# Patient Record
Sex: Female | Born: 1940 | Race: White | Hispanic: No | Marital: Single | Smoking: Never smoker
Health system: Southern US, Community
[De-identification: ages and names within clinical notes are randomized; demographics above are authoritative.]

## PROBLEM LIST (undated history)

## (undated) DIAGNOSIS — I351 Nonrheumatic aortic (valve) insufficiency: Secondary | ICD-10-CM

## (undated) DIAGNOSIS — M8580 Other specified disorders of bone density and structure, unspecified site: Secondary | ICD-10-CM

## (undated) DIAGNOSIS — I219 Acute myocardial infarction, unspecified: Secondary | ICD-10-CM

## (undated) DIAGNOSIS — Z95828 Presence of other vascular implants and grafts: Secondary | ICD-10-CM

## (undated) DIAGNOSIS — I639 Cerebral infarction, unspecified: Secondary | ICD-10-CM

## (undated) DIAGNOSIS — F419 Anxiety disorder, unspecified: Secondary | ICD-10-CM

## (undated) DIAGNOSIS — N390 Urinary tract infection, site not specified: Secondary | ICD-10-CM

## (undated) DIAGNOSIS — I82409 Acute embolism and thrombosis of unspecified deep veins of unspecified lower extremity: Secondary | ICD-10-CM

## (undated) DIAGNOSIS — R600 Localized edema: Secondary | ICD-10-CM

## (undated) DIAGNOSIS — D649 Anemia, unspecified: Secondary | ICD-10-CM

## (undated) DIAGNOSIS — I2510 Atherosclerotic heart disease of native coronary artery without angina pectoris: Secondary | ICD-10-CM

## (undated) DIAGNOSIS — R0600 Dyspnea, unspecified: Secondary | ICD-10-CM

## (undated) DIAGNOSIS — E6609 Other obesity due to excess calories: Secondary | ICD-10-CM

## (undated) DIAGNOSIS — R0609 Other forms of dyspnea: Secondary | ICD-10-CM

## (undated) DIAGNOSIS — I4891 Unspecified atrial fibrillation: Secondary | ICD-10-CM

## (undated) DIAGNOSIS — Z8739 Personal history of other diseases of the musculoskeletal system and connective tissue: Secondary | ICD-10-CM

## (undated) DIAGNOSIS — I10 Essential (primary) hypertension: Secondary | ICD-10-CM

## (undated) DIAGNOSIS — R296 Repeated falls: Secondary | ICD-10-CM

## (undated) DIAGNOSIS — R011 Cardiac murmur, unspecified: Secondary | ICD-10-CM

## (undated) DIAGNOSIS — Z8679 Personal history of other diseases of the circulatory system: Secondary | ICD-10-CM

## (undated) DIAGNOSIS — Z95810 Presence of automatic (implantable) cardiac defibrillator: Secondary | ICD-10-CM

## (undated) DIAGNOSIS — R609 Edema, unspecified: Secondary | ICD-10-CM

## (undated) DIAGNOSIS — N289 Disorder of kidney and ureter, unspecified: Secondary | ICD-10-CM

## (undated) HISTORY — DX: Other forms of dyspnea: R06.09

## (undated) HISTORY — DX: Personal history of other diseases of the musculoskeletal system and connective tissue: Z87.39

## (undated) HISTORY — DX: Other obesity due to excess calories: E66.09

## (undated) HISTORY — DX: Other specified disorders of bone density and structure, unspecified site: M85.80

## (undated) HISTORY — PX: CATARACT EXTRACTION: SUR2

## (undated) HISTORY — PX: STOMACH SURGERY: SHX791

## (undated) HISTORY — DX: Edema, unspecified: R60.9

## (undated) HISTORY — DX: Nonrheumatic aortic (valve) insufficiency: I35.1

## (undated) HISTORY — DX: Presence of automatic (implantable) cardiac defibrillator: Z95.810

## (undated) HISTORY — DX: Dyspnea, unspecified: R06.00

## (undated) HISTORY — DX: Localized edema: R60.0

## (undated) HISTORY — DX: Unspecified atrial fibrillation: I48.91

## (undated) HISTORY — PX: TONSILLECTOMY: SUR1361

## (undated) HISTORY — DX: Personal history of other diseases of the circulatory system: Z86.79

---

## 1998-09-29 DIAGNOSIS — Z95828 Presence of other vascular implants and grafts: Secondary | ICD-10-CM

## 1998-09-29 DIAGNOSIS — I219 Acute myocardial infarction, unspecified: Secondary | ICD-10-CM

## 1998-09-29 DIAGNOSIS — I639 Cerebral infarction, unspecified: Secondary | ICD-10-CM

## 1998-09-29 HISTORY — DX: Presence of other vascular implants and grafts: Z95.828

## 1998-09-29 HISTORY — DX: Acute myocardial infarction, unspecified: I21.9

## 1998-09-29 HISTORY — DX: Cerebral infarction, unspecified: I63.9

## 2000-04-08 ENCOUNTER — Ambulatory Visit (HOSPITAL_COMMUNITY): Admission: RE | Admit: 2000-04-08 | Discharge: 2000-04-08 | Payer: Self-pay | Admitting: Gastroenterology

## 2000-04-08 ENCOUNTER — Encounter (INDEPENDENT_AMBULATORY_CARE_PROVIDER_SITE_OTHER): Payer: Self-pay | Admitting: *Deleted

## 2000-09-28 ENCOUNTER — Encounter: Payer: Self-pay | Admitting: Emergency Medicine

## 2000-09-28 ENCOUNTER — Inpatient Hospital Stay (HOSPITAL_COMMUNITY): Admission: EM | Admit: 2000-09-28 | Discharge: 2000-10-13 | Payer: Self-pay | Admitting: Emergency Medicine

## 2000-09-29 ENCOUNTER — Encounter: Payer: Self-pay | Admitting: Emergency Medicine

## 2000-09-30 ENCOUNTER — Encounter: Payer: Self-pay | Admitting: Emergency Medicine

## 2000-10-01 ENCOUNTER — Encounter: Payer: Self-pay | Admitting: Neurology

## 2000-10-02 ENCOUNTER — Encounter: Payer: Self-pay | Admitting: Neurology

## 2000-10-03 ENCOUNTER — Encounter: Payer: Self-pay | Admitting: Neurology

## 2000-10-05 ENCOUNTER — Encounter: Payer: Self-pay | Admitting: Neurology

## 2000-10-08 ENCOUNTER — Encounter: Payer: Self-pay | Admitting: Neurology

## 2000-10-12 ENCOUNTER — Encounter: Payer: Self-pay | Admitting: Pediatrics

## 2000-10-13 ENCOUNTER — Ambulatory Visit (HOSPITAL_COMMUNITY)
Admission: RE | Admit: 2000-10-13 | Discharge: 2000-10-13 | Payer: Self-pay | Admitting: Physical Medicine & Rehabilitation

## 2000-10-13 ENCOUNTER — Inpatient Hospital Stay
Admission: RE | Admit: 2000-10-13 | Discharge: 2000-10-30 | Payer: Self-pay | Admitting: Physical Medicine & Rehabilitation

## 2000-11-03 ENCOUNTER — Encounter
Admission: RE | Admit: 2000-11-03 | Discharge: 2000-11-20 | Payer: Self-pay | Admitting: Physical Medicine & Rehabilitation

## 2000-11-05 ENCOUNTER — Encounter: Payer: Self-pay | Admitting: Internal Medicine

## 2000-11-05 ENCOUNTER — Inpatient Hospital Stay (HOSPITAL_COMMUNITY): Admission: EM | Admit: 2000-11-05 | Discharge: 2000-11-06 | Payer: Self-pay | Admitting: Emergency Medicine

## 2000-11-05 ENCOUNTER — Encounter: Payer: Self-pay | Admitting: Emergency Medicine

## 2000-12-08 ENCOUNTER — Encounter: Payer: Self-pay | Admitting: Family Medicine

## 2000-12-08 ENCOUNTER — Encounter: Admission: RE | Admit: 2000-12-08 | Discharge: 2000-12-08 | Payer: Self-pay | Admitting: Family Medicine

## 2000-12-14 ENCOUNTER — Encounter: Admission: RE | Admit: 2000-12-14 | Discharge: 2000-12-14 | Payer: Self-pay | Admitting: Family Medicine

## 2000-12-14 ENCOUNTER — Encounter: Payer: Self-pay | Admitting: Family Medicine

## 2001-01-07 ENCOUNTER — Encounter (INDEPENDENT_AMBULATORY_CARE_PROVIDER_SITE_OTHER): Payer: Self-pay

## 2001-01-07 ENCOUNTER — Encounter: Admission: RE | Admit: 2001-01-07 | Discharge: 2001-01-07 | Payer: Self-pay | Admitting: Family Medicine

## 2001-01-07 ENCOUNTER — Encounter: Payer: Self-pay | Admitting: Family Medicine

## 2001-01-07 ENCOUNTER — Other Ambulatory Visit: Admission: RE | Admit: 2001-01-07 | Discharge: 2001-01-07 | Payer: Self-pay | Admitting: Obstetrics and Gynecology

## 2001-01-20 ENCOUNTER — Encounter: Admission: RE | Admit: 2001-01-20 | Discharge: 2001-01-20 | Payer: Self-pay | Admitting: Obstetrics and Gynecology

## 2001-01-20 ENCOUNTER — Encounter: Payer: Self-pay | Admitting: Obstetrics and Gynecology

## 2001-01-26 ENCOUNTER — Encounter: Admission: RE | Admit: 2001-01-26 | Discharge: 2001-01-26 | Payer: Self-pay | Admitting: Family Medicine

## 2001-01-26 ENCOUNTER — Encounter: Payer: Self-pay | Admitting: Family Medicine

## 2001-02-10 ENCOUNTER — Encounter: Payer: Self-pay | Admitting: Obstetrics and Gynecology

## 2001-02-10 ENCOUNTER — Encounter: Admission: RE | Admit: 2001-02-10 | Discharge: 2001-02-10 | Payer: Self-pay | Admitting: Obstetrics and Gynecology

## 2001-04-21 ENCOUNTER — Emergency Department (HOSPITAL_COMMUNITY): Admission: EM | Admit: 2001-04-21 | Discharge: 2001-04-21 | Payer: Self-pay | Admitting: Emergency Medicine

## 2001-04-21 ENCOUNTER — Encounter: Payer: Self-pay | Admitting: Emergency Medicine

## 2001-04-30 ENCOUNTER — Ambulatory Visit (HOSPITAL_COMMUNITY)
Admission: RE | Admit: 2001-04-30 | Discharge: 2001-04-30 | Payer: Self-pay | Admitting: Physical Medicine & Rehabilitation

## 2001-04-30 ENCOUNTER — Encounter: Payer: Self-pay | Admitting: Physical Medicine & Rehabilitation

## 2001-05-17 ENCOUNTER — Inpatient Hospital Stay (HOSPITAL_COMMUNITY): Admission: EM | Admit: 2001-05-17 | Discharge: 2001-05-21 | Payer: Self-pay | Admitting: Emergency Medicine

## 2001-05-17 ENCOUNTER — Encounter: Payer: Self-pay | Admitting: Cardiology

## 2001-05-20 ENCOUNTER — Encounter: Payer: Self-pay | Admitting: Cardiology

## 2001-05-21 ENCOUNTER — Encounter: Payer: Self-pay | Admitting: Cardiology

## 2001-08-25 ENCOUNTER — Inpatient Hospital Stay (HOSPITAL_COMMUNITY): Admission: EM | Admit: 2001-08-25 | Discharge: 2001-08-28 | Payer: Self-pay | Admitting: *Deleted

## 2001-08-25 ENCOUNTER — Encounter: Payer: Self-pay | Admitting: Cardiovascular Disease

## 2001-08-26 ENCOUNTER — Encounter: Payer: Self-pay | Admitting: Internal Medicine

## 2001-08-26 ENCOUNTER — Encounter: Payer: Self-pay | Admitting: Cardiovascular Disease

## 2001-08-27 ENCOUNTER — Encounter: Payer: Self-pay | Admitting: Cardiovascular Disease

## 2001-08-28 ENCOUNTER — Encounter: Payer: Self-pay | Admitting: Internal Medicine

## 2001-09-13 ENCOUNTER — Encounter: Payer: Self-pay | Admitting: Internal Medicine

## 2001-09-13 ENCOUNTER — Ambulatory Visit (HOSPITAL_COMMUNITY): Admission: RE | Admit: 2001-09-13 | Discharge: 2001-09-13 | Payer: Self-pay | Admitting: Internal Medicine

## 2003-03-07 ENCOUNTER — Encounter: Admission: RE | Admit: 2003-03-07 | Discharge: 2003-03-07 | Payer: Self-pay | Admitting: Family Medicine

## 2003-03-07 ENCOUNTER — Encounter: Payer: Self-pay | Admitting: Family Medicine

## 2003-06-18 ENCOUNTER — Emergency Department (HOSPITAL_COMMUNITY): Admission: EM | Admit: 2003-06-18 | Discharge: 2003-06-18 | Payer: Self-pay | Admitting: Emergency Medicine

## 2003-07-06 ENCOUNTER — Other Ambulatory Visit: Admission: RE | Admit: 2003-07-06 | Discharge: 2003-07-06 | Payer: Self-pay | Admitting: Obstetrics and Gynecology

## 2004-10-09 ENCOUNTER — Ambulatory Visit: Payer: Self-pay | Admitting: Neurology

## 2004-11-28 ENCOUNTER — Encounter: Admission: RE | Admit: 2004-11-28 | Discharge: 2004-11-28 | Payer: Self-pay | Admitting: Family Medicine

## 2005-02-20 ENCOUNTER — Other Ambulatory Visit
Admission: RE | Admit: 2005-02-20 | Discharge: 2005-02-20 | Payer: Self-pay | Admitting: Physical Medicine & Rehabilitation

## 2005-03-10 ENCOUNTER — Ambulatory Visit: Payer: Self-pay | Admitting: Internal Medicine

## 2005-06-03 ENCOUNTER — Encounter: Admission: RE | Admit: 2005-06-03 | Discharge: 2005-06-03 | Payer: Self-pay | Admitting: Family Medicine

## 2005-06-09 ENCOUNTER — Ambulatory Visit: Payer: Self-pay | Admitting: Internal Medicine

## 2005-08-22 ENCOUNTER — Inpatient Hospital Stay (HOSPITAL_COMMUNITY): Admission: EM | Admit: 2005-08-22 | Discharge: 2005-08-26 | Payer: Self-pay | Admitting: Emergency Medicine

## 2005-08-25 ENCOUNTER — Encounter: Payer: Self-pay | Admitting: Cardiology

## 2005-09-11 ENCOUNTER — Ambulatory Visit: Payer: Self-pay

## 2006-01-06 ENCOUNTER — Ambulatory Visit: Payer: Self-pay | Admitting: Internal Medicine

## 2006-02-25 ENCOUNTER — Other Ambulatory Visit: Admission: RE | Admit: 2006-02-25 | Discharge: 2006-02-25 | Payer: Self-pay | Admitting: Obstetrics and Gynecology

## 2006-04-02 ENCOUNTER — Ambulatory Visit: Payer: Self-pay

## 2006-08-11 ENCOUNTER — Ambulatory Visit: Payer: Self-pay | Admitting: Internal Medicine

## 2006-12-14 ENCOUNTER — Ambulatory Visit: Payer: Self-pay

## 2007-03-30 ENCOUNTER — Ambulatory Visit: Payer: Self-pay | Admitting: Internal Medicine

## 2007-07-05 ENCOUNTER — Ambulatory Visit: Payer: Self-pay | Admitting: Internal Medicine

## 2007-09-04 ENCOUNTER — Inpatient Hospital Stay (HOSPITAL_COMMUNITY): Admission: EM | Admit: 2007-09-04 | Discharge: 2007-09-09 | Payer: Self-pay | Admitting: Emergency Medicine

## 2007-10-04 ENCOUNTER — Ambulatory Visit: Payer: Self-pay | Admitting: Internal Medicine

## 2008-01-19 ENCOUNTER — Ambulatory Visit: Payer: Self-pay | Admitting: Internal Medicine

## 2008-03-15 ENCOUNTER — Ambulatory Visit: Payer: Self-pay | Admitting: Internal Medicine

## 2008-06-21 ENCOUNTER — Ambulatory Visit: Payer: Self-pay

## 2008-06-22 ENCOUNTER — Ambulatory Visit: Payer: Self-pay | Admitting: Internal Medicine

## 2008-07-12 ENCOUNTER — Ambulatory Visit: Payer: Self-pay | Admitting: Internal Medicine

## 2008-07-12 LAB — CONVERTED CEMR LAB
Basophils Absolute: 0.1 10*3/uL (ref 0.0–0.1)
CO2: 33 meq/L — ABNORMAL HIGH (ref 19–32)
Creatinine, Ser: 1.5 mg/dL — ABNORMAL HIGH (ref 0.4–1.2)
Eosinophils Relative: 7.7 % — ABNORMAL HIGH (ref 0.0–5.0)
GFR calc Af Amer: 45 mL/min
MCHC: 33.8 g/dL (ref 30.0–36.0)
Monocytes Absolute: 0.7 10*3/uL (ref 0.1–1.0)
Monocytes Relative: 12.3 % — ABNORMAL HIGH (ref 3.0–12.0)
Neutro Abs: 2.3 10*3/uL (ref 1.4–7.7)
Neutrophils Relative %: 39.8 % — ABNORMAL LOW (ref 43.0–77.0)
Platelets: 207 10*3/uL (ref 150–400)
Prothrombin Time: 11.5 s (ref 10.9–13.3)
WBC: 5.9 10*3/uL (ref 4.5–10.5)
aPTT: 29.6 s (ref 21.7–29.8)

## 2008-07-18 ENCOUNTER — Ambulatory Visit: Payer: Self-pay | Admitting: Internal Medicine

## 2008-07-18 ENCOUNTER — Ambulatory Visit (HOSPITAL_COMMUNITY): Admission: RE | Admit: 2008-07-18 | Discharge: 2008-07-18 | Payer: Self-pay | Admitting: Internal Medicine

## 2008-08-02 ENCOUNTER — Ambulatory Visit: Payer: Self-pay

## 2008-11-10 ENCOUNTER — Ambulatory Visit: Payer: Self-pay | Admitting: Internal Medicine

## 2008-12-22 ENCOUNTER — Encounter: Payer: Self-pay | Admitting: Internal Medicine

## 2009-01-02 ENCOUNTER — Ambulatory Visit: Payer: Self-pay | Admitting: Internal Medicine

## 2009-01-02 DIAGNOSIS — E785 Hyperlipidemia, unspecified: Secondary | ICD-10-CM | POA: Insufficient documentation

## 2009-01-02 DIAGNOSIS — I509 Heart failure, unspecified: Secondary | ICD-10-CM | POA: Insufficient documentation

## 2009-01-02 DIAGNOSIS — I10 Essential (primary) hypertension: Secondary | ICD-10-CM

## 2009-01-02 LAB — CONVERTED CEMR LAB

## 2009-01-19 DIAGNOSIS — E875 Hyperkalemia: Secondary | ICD-10-CM

## 2009-01-19 DIAGNOSIS — I872 Venous insufficiency (chronic) (peripheral): Secondary | ICD-10-CM | POA: Insufficient documentation

## 2009-01-19 DIAGNOSIS — I6350 Cerebral infarction due to unspecified occlusion or stenosis of unspecified cerebral artery: Secondary | ICD-10-CM | POA: Insufficient documentation

## 2009-01-29 ENCOUNTER — Ambulatory Visit: Payer: Self-pay

## 2009-01-29 ENCOUNTER — Encounter: Payer: Self-pay | Admitting: Internal Medicine

## 2009-02-06 ENCOUNTER — Encounter (INDEPENDENT_AMBULATORY_CARE_PROVIDER_SITE_OTHER): Payer: Self-pay | Admitting: *Deleted

## 2009-02-08 ENCOUNTER — Encounter: Payer: Self-pay | Admitting: Internal Medicine

## 2009-02-13 ENCOUNTER — Encounter: Payer: Self-pay | Admitting: Internal Medicine

## 2009-03-02 ENCOUNTER — Encounter (INDEPENDENT_AMBULATORY_CARE_PROVIDER_SITE_OTHER): Payer: Self-pay | Admitting: *Deleted

## 2009-03-06 ENCOUNTER — Ambulatory Visit: Payer: Self-pay | Admitting: Internal Medicine

## 2009-03-12 ENCOUNTER — Encounter: Payer: Self-pay | Admitting: Internal Medicine

## 2009-03-15 ENCOUNTER — Encounter: Payer: Self-pay | Admitting: Internal Medicine

## 2009-04-30 ENCOUNTER — Ambulatory Visit: Payer: Self-pay

## 2009-04-30 ENCOUNTER — Encounter: Payer: Self-pay | Admitting: Internal Medicine

## 2009-08-08 ENCOUNTER — Ambulatory Visit: Payer: Self-pay | Admitting: Internal Medicine

## 2009-08-08 DIAGNOSIS — R5383 Other fatigue: Secondary | ICD-10-CM

## 2009-08-08 DIAGNOSIS — R5381 Other malaise: Secondary | ICD-10-CM

## 2009-09-05 ENCOUNTER — Telehealth: Payer: Self-pay | Admitting: Internal Medicine

## 2009-09-05 ENCOUNTER — Ambulatory Visit (HOSPITAL_BASED_OUTPATIENT_CLINIC_OR_DEPARTMENT_OTHER): Admission: RE | Admit: 2009-09-05 | Discharge: 2009-09-05 | Payer: Self-pay | Admitting: Internal Medicine

## 2009-09-05 ENCOUNTER — Ambulatory Visit: Payer: Self-pay | Admitting: Diagnostic Radiology

## 2009-09-05 ENCOUNTER — Ambulatory Visit: Payer: Self-pay | Admitting: Internal Medicine

## 2009-09-05 DIAGNOSIS — R609 Edema, unspecified: Secondary | ICD-10-CM

## 2009-09-05 DIAGNOSIS — R413 Other amnesia: Secondary | ICD-10-CM

## 2009-09-05 LAB — CONVERTED CEMR LAB
CO2: 21 meq/L (ref 19–32)
Calcium: 9.4 mg/dL (ref 8.4–10.5)
Chloride: 104 meq/L (ref 96–112)
Pro B Natriuretic peptide (BNP): 400 pg/mL — ABNORMAL HIGH (ref 0.0–100.0)
RBC Folate: 392 ng/mL (ref 180–600)
TSH: 3.679 microintl units/mL (ref 0.350–4.500)
Vitamin B-12: 319 pg/mL (ref 211–911)

## 2009-09-22 ENCOUNTER — Telehealth: Payer: Self-pay | Admitting: Family Medicine

## 2009-09-22 ENCOUNTER — Observation Stay (HOSPITAL_COMMUNITY): Admission: EM | Admit: 2009-09-22 | Discharge: 2009-09-22 | Payer: Self-pay | Admitting: Emergency Medicine

## 2009-09-22 ENCOUNTER — Encounter (INDEPENDENT_AMBULATORY_CARE_PROVIDER_SITE_OTHER): Payer: Self-pay | Admitting: Emergency Medicine

## 2009-09-22 ENCOUNTER — Ambulatory Visit: Payer: Self-pay | Admitting: Vascular Surgery

## 2009-09-25 ENCOUNTER — Telehealth: Payer: Self-pay | Admitting: Internal Medicine

## 2009-10-17 ENCOUNTER — Encounter: Payer: Self-pay | Admitting: Internal Medicine

## 2009-10-18 ENCOUNTER — Ambulatory Visit: Payer: Self-pay | Admitting: Internal Medicine

## 2009-11-07 ENCOUNTER — Ambulatory Visit: Payer: Self-pay

## 2009-11-07 ENCOUNTER — Encounter: Payer: Self-pay | Admitting: Internal Medicine

## 2009-11-19 ENCOUNTER — Telehealth: Payer: Self-pay | Admitting: Internal Medicine

## 2009-12-12 ENCOUNTER — Ambulatory Visit: Payer: Self-pay | Admitting: Internal Medicine

## 2009-12-12 LAB — CONVERTED CEMR LAB
ALT: 14 units/L (ref 0–35)
AST: 19 units/L (ref 0–37)
Bilirubin, Direct: 0.1 mg/dL (ref 0.0–0.3)
Total Protein: 7.4 g/dL (ref 6.0–8.3)

## 2009-12-19 ENCOUNTER — Ambulatory Visit: Payer: Self-pay | Admitting: Internal Medicine

## 2009-12-19 DIAGNOSIS — M545 Low back pain, unspecified: Secondary | ICD-10-CM | POA: Insufficient documentation

## 2010-01-17 ENCOUNTER — Encounter: Payer: Self-pay | Admitting: Internal Medicine

## 2010-01-30 ENCOUNTER — Encounter: Payer: Self-pay | Admitting: Internal Medicine

## 2010-01-30 ENCOUNTER — Ambulatory Visit: Payer: Self-pay

## 2010-04-18 ENCOUNTER — Encounter: Payer: Self-pay | Admitting: Internal Medicine

## 2010-05-21 ENCOUNTER — Ambulatory Visit: Payer: Self-pay | Admitting: Internal Medicine

## 2010-06-17 ENCOUNTER — Encounter: Payer: Self-pay | Admitting: Internal Medicine

## 2010-07-03 ENCOUNTER — Encounter: Payer: Self-pay | Admitting: Internal Medicine

## 2010-07-19 ENCOUNTER — Ambulatory Visit: Payer: Self-pay | Admitting: Cardiology

## 2010-08-14 ENCOUNTER — Ambulatory Visit: Payer: Self-pay

## 2010-09-09 ENCOUNTER — Ambulatory Visit: Payer: Self-pay | Admitting: Internal Medicine

## 2010-09-09 ENCOUNTER — Encounter: Payer: Self-pay | Admitting: Internal Medicine

## 2010-09-09 DIAGNOSIS — M79609 Pain in unspecified limb: Secondary | ICD-10-CM

## 2010-09-09 LAB — CONVERTED CEMR LAB
ALT: 14 units/L (ref 0–35)
Albumin: 4.5 g/dL (ref 3.5–5.2)
CO2: 26 meq/L (ref 19–32)
Calcium: 9.4 mg/dL (ref 8.4–10.5)
Cholesterol: 162 mg/dL (ref 0–200)
LDL Cholesterol: 85 mg/dL (ref 0–99)
Pro B Natriuretic peptide (BNP): 761 pg/mL — ABNORMAL HIGH (ref 0.0–100.0)
Sodium: 142 meq/L (ref 135–145)
Triglycerides: 113 mg/dL (ref <150)
Uric Acid, Serum: 5.1 mg/dL (ref 2.4–7.0)

## 2010-09-20 ENCOUNTER — Encounter: Payer: Self-pay | Admitting: Internal Medicine

## 2010-09-29 HISTORY — PX: EYE SURGERY: SHX253

## 2010-10-29 NOTE — Letter (Signed)
Summary: [facility] Office Note  [facility] Office Note   Imported By: Roderic Ovens 11/14/2009 11:13:24  _____________________________________________________________________  External Attachment:    Type:   Image     Comment:   External Document

## 2010-10-29 NOTE — Cardiovascular Report (Signed)
Summary: Office Visit   Office Visit   Imported By: Roderic Ovens 05/22/2010 15:24:18  _____________________________________________________________________  External Attachment:    Type:   Image     Comment:   External Document

## 2010-10-29 NOTE — Procedures (Signed)
Summary: DEVICE/SAF   Current Medications (verified): 1)  Aspirin 325 Mg Tabs (Aspirin) .... Take 1 Tablet By Mouth Once A Day 2)  Diovan 320 Mg Tabs (Valsartan) .... Take 1 Tablet By Mouth Once A Day 3)  Klor-Con M20 20 Meq Cr-Tabs (Potassium Chloride Crys Cr) .... Take 1 Tablet By Mouth Once A Day 4)  Metoprolol Tartrate 50 Mg Tabs (Metoprolol Tartrate) .... Take 1 Tablet By Mouth Once A Day 5)  Furosemide 40 Mg Tabs (Furosemide) .... Take 1 1/2  Tablet By Mouth Once A Day 6)  Allopurinol 300 Mg Tabs (Allopurinol) .... One By Mouth Once Daily 7)  Crestor 5 Mg Tabs (Rosuvastatin Calcium) .... Take 1 Tablet By Mouth Once A Day 8)  Dulcolax 5 Mg Tbec (Bisacodyl) .... Take 1 Tablet By Mouth Once A Day As Needed 9)  Colchicine 0.6 Mg Tabs (Colchicine) .... One By Mouth Once Daily  Allergies (verified): 1)  ! Neosporin   ICD Specifications Following MD:  Sherryl Manges, MD     ICD Vendor:  Medtronic     ICD Model Number:  [id number]     ICD Serial Number:  [id number] ICD DOI:  07/18/2008     ICD Implanting MD:  Sherryl Manges, MD  Lead 1:    Location: RV     DOI: 07/18/2008     Model #: [id number]     Serial #: [id number]     Status: active  Indications::  CARDIAC ARREST   ICD Follow Up Remote Check?  No Battery Voltage:  3.20 V     Charge Time:  8.8 seconds     Underlying rhythm:  SR ICD Dependent:  No       ICD Device Measurements Right Ventricle:  Amplitude: 12.3 mV, Impedance: 768 ohms, Threshold: 1.0 V at 0.4 msec Shock Impedance: 51/61 ohms   Episodes Coumadin:  No Shock:  0     ATP:  0     Nonsustained:  8     Ventricular Pacing:  4.5%  Brady Parameters Mode VVI     Lower Rate Limit:  50      Tachy Zones VF:  207     VT:  182     Next Cardiology Appt Due:  01/28/2010 Tech Comments:  Normal device function.  Pt with 8 episodes of NSVT that were below detection.  Far field EGM appeared to differ from real-time.  VT zone was set at 162 to allow treatment for these episodes.  Upon  further review, these are afib with RVR.  Will contact patient and either have her come back by office or will go to her house to put VT detection back at 182bpm to prevent inappropriate therapy. Pt not on Coumadin because of prior hemmorhagic stroke. Gypsy Balsam RN BSN  November 07, 2009 11:42 AM  VT zone reset to 182bpm. Gypsy Balsam RN BSN  November 08, 2009 3:04 PM

## 2010-10-29 NOTE — Progress Notes (Signed)
Summary: UPDATE ON CONDITION FROM [facility]   Phone Note Other Incoming   Caller: LISA MATTHEWS [facility]  Summary of Call: SHE ACCESSED HER ON FRIDAY SHE IS DOING WELL ON HER CHOLESTERAL AND GOUT  MEDICINE.  SHE IS ON DUCOLEX.  SHE IS ON A CARDIOCOM SCALE AND IT IS HELPING WITH HER CONGESTIVE HEART FAILURE 1 PLUS ON EDIMA 132/62 PACING AT 68 ON AICD.  IF YOU NEED HER TO ADDRESS ANYTHING ELSE [phone],   Initial call taken by: Roselle Locus,  November 19, 2009 3:07 PM  Follow-up for Phone Call        noted Follow-up by: D. Thomos Lemons DO,  November 19, 2009 3:22 PM

## 2010-10-29 NOTE — Letter (Signed)
Summary: [facility]   Imported By: Lanelle Bal 05/10/2010 12:25:04  _____________________________________________________________________  External Attachment:    Type:   Image     Comment:   External Document

## 2010-10-29 NOTE — Procedures (Signed)
Summary: DEVICE/SAF   Current Medications (verified): 1)  Aspirin 325 Mg Tabs (Aspirin) .... Take 1 Tablet By Mouth Once A Day 2)  Diovan 320 Mg Tabs (Valsartan) .... Take 1 Tablet By Mouth Once A Day 3)  Klor-Con M20 20 Meq Cr-Tabs (Potassium Chloride Crys Cr) .... Take 1 Tablet By Mouth Once A Day 4)  Metoprolol Tartrate 50 Mg Tabs (Metoprolol Tartrate) .... Take 1 Tablet By Mouth Once A Day 5)  Furosemide 40 Mg Tabs (Furosemide) .... Take 1 1/2  Tablet By Mouth Once A Day 6)  Allopurinol 300 Mg Tabs (Allopurinol) .... One By Mouth Once Daily 7)  Crestor 5 Mg Tabs (Rosuvastatin Calcium) .... Take 1 Tablet By Mouth Once A Day 8)  Dulcolax 5 Mg Tbec (Bisacodyl) .... Take 1 Tablet By Mouth Once A Day As Needed 9)  Colchicine 0.6 Mg Tabs (Colchicine) .... One By Mouth Once Daily  Allergies (verified): 1)  ! Neosporin   ICD Specifications Following MD:  Sherryl Manges, MD     ICD Vendor:  Medtronic     ICD Model Number:  D154VWC     ICD Serial Number:  [id number] ICD DOI:  07/18/2008     ICD Implanting MD:  Sherryl Manges, MD  Lead 1:    Location: RV     DOI: 07/18/2008     Model #: [id number]     Serial #: [id number]     Status: active  Indications::  CARDIAC ARREST   ICD Follow Up Battery Voltage:  3.17 V     Charge Time:  9.0 seconds     Underlying rhythm:  SR ICD Dependent:  No       ICD Device Measurements Right Ventricle:  Amplitude: 11.6 mV, Impedance: 832 ohms, Threshold: 2.0 V at 0.4 msec Shock Impedance: 51/66 ohms   Episodes MS Episodes:  0     Percent Mode Switch:  0     Coumadin:  No Nonsustained:  2     Atrial Therapies:  0 Ventricular Pacing:  5.8%  Brady Parameters Mode VVI     Lower Rate Limit:  50      Tachy Zones VF:  207     VT:  182     VT1:  OFF     Next Cardiology Appt Due:  04/29/2010 Tech Comments:  2 NST EPISODES-LONGEST WAS 3 SECONDS.  NORMAL DEVICE FUNCTION.  CHANGED RV AMPLITUDE FROM 2.5 TO 3.0 DUE TO THRESHOLD.  ROV IN CLINIC 3 MTHS. Vella Kohler      Patient Instructions: 1)  Pt to be scheduled for 3 month clinic appointment.

## 2010-10-29 NOTE — Assessment & Plan Note (Signed)
Summary: PACER CHECK   Primary [name]:  Dondra Spry DO  CC:  pacer check. pt not feeling well.  Very sensitive to touch and bp.  Pt not sure of medication.[name]  History of Present Illness:    [name] is seen in followup for aborted cardiac arrest with the implanted ICD. She also has a non ischemic cardiomyopathy with moderate aortic insufficiencyand  with permanent atrial fibrillation.   She complains of periodic SOB; this is fequently but not always exertional and can also be associated wtih cheast heaviness  The last cath I could find was 2002 without obstructive disease   Her most recent echo was reviewed from 2006:  -   Left ventricular size was at the upper limits of normal. Overall         left ventricular systolic function was mildly decreased. Left         ventricular ejection fraction was estimated , range being 40         % to 50 %.. There was no diagnostic evidence of left         ventricular regional wall motion abnormalities. Left         ventricular wall thickness was mildly increased.   -  Aortic valve thickness was mildly to moderately increased. There         was moderate aortic valvular regurgitation.   -  There was mild aortic root dilatation.    she also has periodic edema which correlates in part with her shortness of breath. She adjust s her Lasix on an as-needed basis to accommodate this. Her diet is full salt.   Current Medications (verified): 1)  Aspirin 325 Mg Tabs (Aspirin) .... Take 1 Tablet By Mouth Once A Day 2)  Diovan 320 Mg Tabs (Valsartan) .... Take 1 Tablet By Mouth Once A Day 3)  Klor-Con M20 20 Meq Cr-Tabs (Potassium Chloride Crys Cr) .... Take 1 Tablet By Mouth Once A Day 4)  Metoprolol Tartrate 50 Mg Tabs (Metoprolol Tartrate) .... Take 1 Tablet By Mouth Once A Day 5)  Furosemide 40 Mg Tabs (Furosemide) .... Take 1 1/2  Tablet By Mouth Once A Day 6)  Allopurinol 300 Mg Tabs (Allopurinol) .... One By Mouth Once Daily 7)  Crestor 5 Mg  Tabs (Rosuvastatin Calcium) .... Take 1 Tablet By Mouth Once A Day 8)  Dulcolax 5 Mg Tbec (Bisacodyl) .... Take 1 Tablet By Mouth Once A Day As Needed 9)  Colchicine 0.6 Mg Tabs (Colchicine) .... One By Mouth Once Daily  Allergies (verified): 1)  ! Neosporin  Past History:  Past Medical History: Last updated: 12/19/2009 Aborted cardiac arrest Aortic insuffihciency  ATRIAL FIBRILLATION (ICD-427.31)   ICD - IN SITU (ICD-V45.02)   CONGESTIVE HEART FAILURE (ICD-428.0)  HYPOTENSION, UNSPECIFIED (ICD-458.9)  HYPERTENSION (ICD-401.9) HYPERLIPIDEMIA (ICD-272.4) VENOUS INSUFFICIENCY (ICD-459.81) HYPERKALEMIA (ICD-276.7) CVA (ICD-434.91) GOUT (ICD-274.9) HEALTH MAINTENANCE EXAM (ICD-V70.0) Hx of bradycardia Hx of hemorrhagic stroke 1998 (precluding use of coumadin) Hx of DVT S/P IVC filter Hx of V Fib arrest 2000 (SP ICD)  Past Surgical History: Last updated: 12/19/2009 ICD  8/02 - Rudean Hitt Colonoscopy 2001- Dr. Loreta Ave       Family History: Last updated: 12/19/2009 Father - lung cancer, colon cancer, skin cancer Mother - arthritis, osteoporosis Diabetes - no Breast ca - no       Social History: Last updated: 12/19/2009 Divorced - lives with oldest daughter 2 daughters 77 & 70 2 Sons 47 & 41  Retired - managed a Training and development officer for 23 yrs      Vital Signs:  Patient profile:   70 year old female Height:      62 inches Weight:      200 pounds BMI:     36.71 Pulse rate:   76 / minute Pulse rhythm:   regular BP sitting:   132 / 72  (left arm) Cuff size:   large  Vitals Entered By: Judithe Modest CMA (May 21, 2010 9:38 AM)  Physical Exam  General:  The patient was alert and oriented in no acute distresswith mild SOB HEENT Normal.  Neck veins were flat, carotids were brisk.  Lungs were clear.  Heart sounds were regular without murmurs or gallops.  Abdomen was soft with active bowel sounds. There is no clubbing cyanosis; mild edema Skin Warm and dry         ICD Specifications Following MD:  Sherryl Manges, MD     ICD Vendor:  Medtronic     ICD Model Number:  D154VWC     ICD Serial Number:  [id number] ICD DOI:  07/18/2008     ICD Implanting MD:  Sherryl Manges, MD  Lead 1:    Location: RV     DOI: 07/18/2008     Model #: 1308     Serial #: [id number]     Status: active  Indications::  CARDIAC ARREST   ICD Follow Up Remote Check?  No Battery Voltage:  3.19 V     Charge Time:  9.0 seconds     Underlying rhythm:  SR ICD Dependent:  No       ICD Device Measurements Right Ventricle:  Amplitude: 17 mV, Impedance: 792 ohms, Threshold: 1.0 V at 0.4 msec Shock Impedance: 53/64 ohms   Episodes Coumadin:  No Shock:  0     ATP:  0     Nonsustained:  0     Ventricular Pacing:  11.5%  Brady Parameters Mode VVI     Lower Rate Limit:  50      Tachy Zones VF:  207     VT:  182     VT1:  OFF     Next Cardiology Appt Due:  07/30/2010 Tech Comments:  lead impedance alert values reprogrammed.  Optivol and thoracic impedance normal.  ROV 3 months clinic. Altha Harm, LPN  May 21, 2010 9:46 AM   Impression & Recommendations:  Problem # 1:  ATRIAL FIBRILLATION (ICD-427.31)  permament Her updated medication list for this problem includes:    Aspirin 325 Mg Tabs (Aspirin) .[name]... Take 1 tablet by mouth once a day    Metoprolol Tartrate 50 Mg Tabs (Metoprolol tartrate) .[name]... Take 1 tablet by mouth once a day  Her updated medication list for this problem includes:    Aspirin 325 Mg Tabs (Aspirin) .[name]... Take 1 tablet by mouth once a day    Metoprolol Tartrate 50 Mg Tabs (Metoprolol tartrate) .[name]... Take 1 tablet by mouth once a day  Problem # 2:  ABORTED CARDIAC ARREST (ICD-427.5) no recuurrent VT Her updated medication list for this problem includes:    Aspirin 325 Mg Tabs (Aspirin) .[name]... Take 1 tablet by mouth once a day    Metoprolol Tartrate 50 Mg Tabs (Metoprolol tartrate) .[name]... Take 1 tablet by mouth once a day  Problem # 3:  ICD - IN  SITU-MEDTRONIC (ICD-V45.02) Device parameters and data were reviewed and no changes were made  Patient Instructions: 1)  Your physician  recommends that you schedule a follow-up appointment in: 3 MONTHS WITH KRISTIN AND PAULA 2)  Your physician recommends that you continue on your current medications as directed. Please refer to the Current Medication list given to you today.

## 2010-10-29 NOTE — Cardiovascular Report (Signed)
Summary: Office Visit   Office Visit   Imported By: Roderic Ovens 02/11/2010 16:41:52  _____________________________________________________________________  External Attachment:    Type:   Image     Comment:   External Document

## 2010-10-29 NOTE — Assessment & Plan Note (Signed)
Summary: 1 mo. f/u - jr   Vital Signs:  Patient profile:   70 year old female Weight:      200.25 pounds BMI:     36.76 O2 Sat:      96 % on Room air Temp:     97.4 degrees F oral Pulse rate:   64 / minute Pulse rhythm:   regular Resp:     18 per minute  Vitals Entered By: Glendell Docker CMA (October 18, 2009 8:32 AM)  O2 Flow:  Room air  Primary Care Provider:  D. Thomos Lemons DO  CC:  1 Month Follow up .  History of Present Illness: 1 Month Follow up   70 y/o white female for f/u.  after prev visit - pt seen in ER for severe left leg pain.  she was seen in ER.   she continued to have severe pain.  just sheet rubbing on her foot caused pain. pt was not able to come in for evaluation. she was tx'ed for presumed gout attack with prednisone.  her leg is feeling much better.  her prev uric acid level was 10.2  Preventive Screening-Counseling & Management  Alcohol-Tobacco     Smoking Status: never  Allergies: 1)  ! Neosporin  Past History:  Past Medical History: Aborted cardiac arrest Aortic insuffihciency  ATRIAL FIBRILLATION (ICD-427.31)  ICD - IN SITU (ICD-V45.02)  CONGESTIVE HEART FAILURE (ICD-428.0)  HYPOTENSION, UNSPECIFIED (ICD-458.9)  HYPERTENSION (ICD-401.9) HYPERLIPIDEMIA (ICD-272.4) VENOUS INSUFFICIENCY (ICD-459.81) HYPERKALEMIA (ICD-276.7) CVA (ICD-434.91) GOUT (ICD-274.9) HEALTH MAINTENANCE EXAM (ICD-V70.0) Hx of bradycardia Hx of hemorrhagic stroke 1998 (precluding use of coumadin) Hx of DVT S/P IVC filter Hx of V Fib arrest 2000 (SP ICD)  Past Surgical History: ICD  8/02 - Rudean Hitt Colonoscopy 2001- Dr. Loreta Ave      Family History: Father - lung cancer, colon cancer, skin cancer Mother - arthritis, osteoporosis Diabetes - no Breast ca - no      Social History: Divorced - lives with oldest daughter 2 daughters 75 & 56 2 Sons 45 & 18   Retired - managed a Training and development officer for 23 yrs     Physical Exam  General:  alert,  well-developed, and well-nourished.   Lungs:  normal respiratory effort and normal breath sounds.   Heart:  normal rate, regular rhythm, and no gallop.   Extremities:  1+ left pedal edema and trace right pedal edema.  bilateral varicosities Neurologic:  cranial nerves II-XII intact and gait normal.     Impression & Recommendations:  Problem # 1:  GOUT (ICD-274.9) Pt with recent severe left leg pain.  she was tx'ed with prednisone with good response.  Uric acid > 10.  Increase allopurinol to 300 mg.  pt advised to start taking colchicine first to avoid gout flare.  Her updated medication list for this problem includes:    Allopurinol 300 Mg Tabs (Allopurinol) ..... One by mouth once daily    Colchicine 0.6 Mg Tabs (Colchicine) ..... One by mouth once daily  Complete Medication List: 1)  Aspirin 325 Mg Tabs (Aspirin) .... Take 1 tablet by mouth once a day 2)  Diovan 320 Mg Tabs (Valsartan) .... Take 1 tablet by mouth once a day 3)  Klor-con M20 20 Meq Cr-tabs (Potassium chloride crys cr) .... Take 1 tablet by mouth once a day 4)  Metoprolol Tartrate 50 Mg Tabs (Metoprolol tartrate) .... Take 1 tablet by mouth once a day 5)  Furosemide 40 Mg Tabs (Furosemide) .... Take 1  1/2  tablet by mouth once a day 6)  Allopurinol 300 Mg Tabs (Allopurinol) .... One by mouth once daily 7)  Crestor 5 Mg Tabs (Rosuvastatin calcium) .... Take 1 tablet by mouth once a day 8)  Dulcolax 5 Mg Tbec (Bisacodyl) .... Take 1 tablet by mouth once a day as needed 9)  Colchicine 0.6 Mg Tabs (Colchicine) .... One by mouth once daily  Patient Instructions: 1)  Please schedule a follow-up appointment in 2 months. 2)  Uric Acid level:  274.9 3)  Hepatic Panel prior to visit, ICD-9: 272.4 4)  Please return for lab work one (1) week before your next appointment.  5)  If you experience diarrhea after taking colchicine, take 1/2 tab daily Prescriptions: ALLOPURINOL 300 MG TABS (ALLOPURINOL) one by mouth once daily  #30 x  2   Entered and Authorized by:   D. Thomos Lemons DO   Signed by:   D. Thomos Lemons DO on 10/18/2009   Method used:   Electronically to        [id number]* (retail)       [address]       [place], [place]  [address]       Ph: [phone] or [phone]       Fax: [phone]   RxID:   [id number] COLCHICINE 0.6 MG TABS (COLCHICINE) one by mouth once daily  #30 x 1   Entered and Authorized by:   D. Thomos Lemons DO   Signed by:   D. Thomos Lemons DO on 10/18/2009   Method used:   Electronically to        [id number]* (retail)       [address]       [place], [place]  [address]       Ph: [phone] or [phone]       Fax: [phone]   RxID:   [id number]   Current Allergies (reviewed today): ! NEOSPORIN

## 2010-10-29 NOTE — Letter (Signed)
Summary: [facility]   Imported By: Lanelle Bal 01/29/2010 13:42:51  _____________________________________________________________________  External Attachment:    Type:   Image     Comment:   External Document

## 2010-10-29 NOTE — Miscellaneous (Signed)
Summary: Flu/[facility]  [facility]   Imported By: Lanelle Bal 07/11/2010 12:26:55  _____________________________________________________________________  External Attachment:    Type:   Image     Comment:   External Document

## 2010-10-29 NOTE — Procedures (Signed)
Summary: device check/sl   Current Medications (verified): 1)  Aspirin 325 Mg Tabs (Aspirin) .... Take 1 Tablet By Mouth Once A Day 2)  Klor-Con M20 20 Meq Cr-Tabs (Potassium Chloride Crys Cr) .... Take 1 Tablet By Mouth Once A Day 3)  Metoprolol Tartrate 50 Mg Tabs (Metoprolol Tartrate) .... Take 1 Tablet By Mouth Once A Day 4)  Furosemide 40 Mg Tabs (Furosemide) .... Take 1 1/2  Tablet By Mouth Once A Day 5)  Allopurinol 300 Mg Tabs (Allopurinol) .... One By Mouth Once Daily 6)  Crestor 5 Mg Tabs (Rosuvastatin Calcium) .... Take 1 Tablet By Mouth Once A Day 7)  Dulcolax 5 Mg Tbec (Bisacodyl) .... Take 1 Tablet By Mouth Once A Day As Needed 8)  Colchicine 0.6 Mg Tabs (Colchicine) .... One By Mouth Once Daily  Allergies (verified): 1)  ! Neosporin   ICD Specifications Following MD:  Sherryl Manges, MD     ICD Vendor:  [employer]tronic     ICD Model Number:  [id number]     ICD Serial Number:  [id number] ICD DOI:  07/18/2008     ICD Implanting MD:  Sherryl Manges, MD  Lead 1:    Location: RV     DOI: 07/18/2008     Model #: [id number]     Serial #: [id number]     Status: active  Indications::  CARDIAC ARREST   ICD Follow Up Remote Check?  No Battery Voltage:  3.16 V     Charge Time:  9.2 seconds     Underlying rhythm:  A-fib ICD Dependent:  No       ICD Device Measurements Right Ventricle:  Amplitude: 14.6 mV, Impedance: 776 ohms, Threshold: 1.5 V at 0.4 msec Shock Impedance: 50/57 ohms   Episodes Coumadin:  No Shock:  0     ATP:  0     Nonsustained:  2     Ventricular Pacing:  10.6%  Brady Parameters Mode VVI     Lower Rate Limit:  50      Tachy Zones VF:  207     VT:  182     VT1:  OFF     Next Cardiology Appt Due:  10/30/2010 Tech Comments:  No parameter changes.  Device function normal.  Optivol and thoracic impedance.  Ms. [name] will take an extra fluid pill today as instructed by Dr. Patty Sermons.  I also noticed she was extremely SOB walking just a short distance down the hall today.  I  spoke with [place] @ [facility].  I will send over a copy of her print outs today so she can pass all of this on to Dr. Patty Sermons.   Ms. [name] has a history of chronic A-fib and is not on coumadin because of a history of intracerebral hemorrhage.  We will check her device in 3 months.   Altha Harm, LPN  August 14, 2010 10:33 AM

## 2010-10-29 NOTE — Cardiovascular Report (Signed)
Summary: Office Visit   Office Visit   Imported By: Roderic Ovens 11/16/2009 16:12:35  _____________________________________________________________________  External Attachment:    Type:   Image     Comment:   External Document

## 2010-10-29 NOTE — Assessment & Plan Note (Signed)
Summary: 2 MONTH FOLLOW UP/MHF, resched- jr   Vital Signs:  Patient profile:   70 year old female Height:      62 inches Weight:      202.88 pounds BMI:     37.24 O2 Sat:      95 % Temp:     97.5 degrees F oral Pulse rate:   74 / minute Pulse rhythm:   regular Resp:     20 per minute BP sitting:   138 / 58  (right arm) Cuff size:   large  Vitals Entered By: Glendell Docker CMA (December 19, 2009 10:44 AM) CC: Rm 2- 2 month follow up, Back pain     Last PAP Result Declined   Primary Care Provider:  DThomos Lemons DO  CC:  Rm 2- 2 month follow up and Back pain.  History of Present Illness:  Back Pain      This is a 70 year old woman who presents with Back pain.  The patient denies fever, chills, weakness, and loss of sensation.  The pain is located in the right low back.  The pain began at home and gradually.  The pain radiates to the right leg below the knee.  The pain is made worse by activity.    gout - no flares since higher dose of allopurinol  Preventive Screening-Counseling & Management  Alcohol-Tobacco     Smoking Status: never  Allergies: 1)  ! Neosporin  Past History:  Past Medical History: Aborted cardiac arrest Aortic insuffihciency  ATRIAL FIBRILLATION (ICD-427.31)   ICD - IN SITU (ICD-V45.02)   CONGESTIVE HEART FAILURE (ICD-428.0)  HYPOTENSION, UNSPECIFIED (ICD-458.9)  HYPERTENSION (ICD-401.9) HYPERLIPIDEMIA (ICD-272.4) VENOUS INSUFFICIENCY (ICD-459.81) HYPERKALEMIA (ICD-276.7) CVA (ICD-434.91) GOUT (ICD-274.9) HEALTH MAINTENANCE EXAM (ICD-V70.0) Hx of bradycardia Hx of hemorrhagic stroke 1998 (precluding use of coumadin) Hx of DVT S/P IVC filter Hx of V Fib arrest 2000 (SP ICD)  Past Surgical History: ICD  8/02 - Rudean Hitt Colonoscopy 2001- Dr. Loreta Ave       Family History: Father - lung cancer, colon cancer, skin cancer Mother - arthritis, osteoporosis Diabetes - no Breast ca - no       Social History: Divorced - lives  with oldest daughter 2 daughters 65 & 5 2 Sons 9 & 65   Retired - managed a Training and development officer for 23 yrs      Review of Systems       low back pain and right leg pain  Physical Exam  General:  alert, well-developed, and well-nourished.   Lungs:  normal respiratory effort and normal breath sounds.   Heart:  normal rate, regular rhythm, and no gallop.   Msk:  increased muscle tone, right paraspinal muscles Extremities:  trace left pedal edema and trace right pedal edema.     Impression & Recommendations:  Problem # 1:  BACK PAIN, LUMBAR (ICD-724.2) 70 y/o with lumbar strain.   Use muscle relaxer as directed.   If persistent symptoms, consider imaging.  Her updated medication list for this problem includes:    Aspirin 325 Mg Tabs (Aspirin) .[name]... Take 1 tablet by mouth once a day    Metaxalone 800 Mg Tabs (Metaxalone) .[name]... 1/2 tab by mouth two times a day as needed for back pain  Problem # 2:  GOUT (ICD-274.9) Assessment: Improved No gout flares since higher dose of allopurinol.  uric acid level 5.9 - decreased from 10.2 Her updated medication list for this problem includes:    Allopurinol  300 Mg Tabs (Allopurinol) ..... One by mouth once daily    Colchicine 0.6 Mg Tabs (Colchicine) ..... One by mouth once daily  Complete Medication List: 1)  Aspirin 325 Mg Tabs (Aspirin) .... Take 1 tablet by mouth once a day 2)  Diovan 320 Mg Tabs (Valsartan) .... Take 1 tablet by mouth once a day 3)  Klor-con M20 20 Meq Cr-tabs (Potassium chloride crys cr) .... Take 1 tablet by mouth once a day 4)  Metoprolol Tartrate 50 Mg Tabs (Metoprolol tartrate) .... Take 1 tablet by mouth once a day 5)  Furosemide 40 Mg Tabs (Furosemide) .... Take 1 1/2  tablet by mouth once a day 6)  Allopurinol 300 Mg Tabs (Allopurinol) .... One by mouth once daily 7)  Crestor 5 Mg Tabs (Rosuvastatin calcium) .... Take 1 tablet by mouth once a day 8)  Dulcolax 5 Mg Tbec (Bisacodyl) .... Take 1 tablet by mouth once a day  as needed 9)  Colchicine 0.6 Mg Tabs (Colchicine) .... One by mouth once daily 10)  Metaxalone 800 Mg Tabs (Metaxalone) .... 1/2 tab by mouth two times a day as needed for back pain 11)  Voltaren 1 % Gel (Diclofenac sodium) .... Apply three times a day  Patient Instructions: 1)  Please schedule a follow-up appointment in 2 weeks. 2)  Call our office if your symptoms do not  improve or gets worse. Prescriptions: METAXALONE 800 MG TABS (METAXALONE) 1/2 tab by mouth two times a day as needed for back pain  #20 x 0   Entered and Authorized by:   D. Thomos Lemons DO   Signed by:   D. Thomos Lemons DO on 12/19/2009   Method used:   Electronically to        [id number]* (retail)       [address]       [place], [place]  [address]       Ph: [phone] or [phone]       Fax: [phone]   RxID:   [id number]   Current Allergies (reviewed today): ! NEOSPORIN     Preventive Care Screening  Pap Smear:    Date:  12/19/2009    Results:  Declined

## 2010-10-29 NOTE — Letter (Signed)
Summary: [facility]   Imported By: Lanelle Bal 10/29/2009 12:51:44  _____________________________________________________________________  External Attachment:    Type:   Image     Comment:   External Document

## 2010-10-29 NOTE — Miscellaneous (Signed)
Summary: Flu Vaccine  Clinical Lists Changes  Observations: Added new observation of FLU VAX: Historical (06/17/2010 14:07)      Immunization History:  Influenza Immunization History:    Influenza:  historical (06/17/2010)

## 2010-10-31 NOTE — Assessment & Plan Note (Signed)
Summary: leg pain/mhf   Vital Signs:  Patient profile:   70 year old female Height:      62 inches Weight:      202.75 pounds BMI:     37.22 O2 Sat:      98 % on Room air Temp:     97.6 degrees F oral Pulse rate:   68 / minute BP sitting:   106 / 70  (right arm) Cuff size:   large  Vitals Entered By: Glendell Docker CMA (September 09, 2010 9:16 AM)  O2 Flow:  Room air CC: Bilateral Leg Pain Is Patient Diabetic? No Pain Assessment Patient in pain? yes     Location: foot Intensity: 6 Type: burning Onset of pain  With activity   Primary Care Provider:  Dondra Spry DO  CC:  Bilateral Leg Pain.  History of Present Illness: 70 y/o white female c/o bilateral leg swelling   bottom of feet feels like she is walking on sponges fo some kind  Htn - stable CHF - denies orthopnea or chest pain  Preventive Screening-Counseling & Management  Alcohol-Tobacco     Smoking Status: never  Allergies: 1)  ! Neosporin  Past History:  Past Medical History: Aborted cardiac arrest Aortic insuffihciency  ATRIAL FIBRILLATION (ICD-427.31)   ICD - IN SITU (ICD-V45.02)   CONGESTIVE HEART FAILURE (ICD-428.0)  HYPOTENSION, UNSPECIFIED (ICD-458.9)  HYPERTENSION (ICD-401.9) HYPERLIPIDEMIA (ICD-272.4) VENOUS INSUFFICIENCY (ICD-459.81)  HYPERKALEMIA (ICD-276.7) CVA (ICD-434.91) GOUT (ICD-274.9) HEALTH MAINTENANCE EXAM (ICD-V70.0) Hx of bradycardia Hx of hemorrhagic stroke 1998 (precluding use of coumadin) Hx of DVT S/P IVC filter Hx of V Fib arrest 2000 (SP ICD)  Past Surgical History: ICD  8/02 - Rudean Hitt Colonoscopy 2001- Dr. Loreta Ave        Family History: Father - lung cancer, colon cancer, skin cancer Mother - arthritis, osteoporosis Diabetes - no Breast ca - no        Social History: Divorced - lives with oldest daughter 2 daughters 31 & 67 2 Sons 21 & 34   Retired - managed a Training and development officer for 23 yrs       Physical Exam  General:  alert,  well-developed, and well-nourished.   Lungs:  normal respiratory effort and normal breath sounds.   Heart:  normal rate, regular rhythm, and no gallop.   Extremities:  trace left pedal edema and trace right pedal edema.   Neurologic:  pt able to sense vibration and temp of lower ext / feet   Impression & Recommendations:  Problem # 1:  FOOT PAIN, BILATERAL (ICD-729.5) Assessment New pt having bilateral foot pain.  no sign or symptoms or acute gout flare I suspect component of DJD she c/o sponge like sensation when she walks pes planus refer to podiatry to see if orthodics may help Orders: T-Uric Acid (Blood) ([address]) Podiatry Referral (Podiatry)  Problem # 2:  GOUT (ICD-274.9) Assessment: Unchanged pt advised to use colchicine as needed we discussed risk of developing neuropathy with chronic use  Her updated medication list for this problem includes:    Allopurinol 300 Mg Tabs (Allopurinol) ..... One by mouth once daily    Colchicine 0.6 Mg Tabs (Colchicine) ..... One by mouth once daily  Orders: T-Uric Acid (Blood) ([id number]) T-Hepatic Function (29562-13086)  Problem # 3:  HYPERTENSION (ICD-401.9) Assessment: Unchanged  Her updated medication list for this problem includes:    Metoprolol Tartrate 50 Mg Tabs (Metoprolol tartrate) .[name]... Take 1 tablet by mouth once a day  Furosemide 40 Mg Tabs (Furosemide) .[name]... Take 1 1/2  tablet by mouth once a day    Cozaar 100 Mg Tabs (Losartan potassium) .[name]... Take 1 tablet by mouth once a day  Orders: T-Basic Metabolic Panel ([phone])  BP today: 106/70 Prior BP: 132/72 (05/21/2010)  Labs Reviewed: K+: 4.5 (09/05/2009) Creat: : 1.59 (09/05/2009)     Problem # 4:  CONGESTIVE HEART FAILURE (ICD-428.0) Assessment: Unchanged pt appears euvolemic  Her updated medication list for this problem includes:    Aspirin 325 Mg Tabs (Aspirin) .[name]... Take 1 tablet by mouth once a day    Metoprolol Tartrate 50 Mg Tabs  (Metoprolol tartrate) .[name]... Take 1 tablet by mouth once a day    Furosemide 40 Mg Tabs (Furosemide) .[name]... Take 1 1/2  tablet by mouth once a day    Cozaar 100 Mg Tabs (Losartan potassium) .[name]... Take 1 tablet by mouth once a day  Orders: T-BNP  (B Natriuretic Peptide) (44010-27253)  Problem # 5:  VENOUS INSUFFICIENCY (ICD-459.81)  Orders: Vascular Clinic (Vascular)  Complete Medication List: 1)  Aspirin 325 Mg Tabs (Aspirin) .... Take 1 tablet by mouth once a day 2)  Klor-con M20 20 Meq Cr-tabs (Potassium chloride crys cr) .... Take 1 tablet by mouth once a day 3)  Metoprolol Tartrate 50 Mg Tabs (Metoprolol tartrate) .... Take 1 tablet by mouth once a day 4)  Furosemide 40 Mg Tabs (Furosemide) .... Take 1 1/2  tablet by mouth once a day 5)  Allopurinol 300 Mg Tabs (Allopurinol) .... One by mouth once daily 6)  Crestor 5 Mg Tabs (Rosuvastatin calcium) .... Take 1 tablet by mouth once a day 7)  Dulcolax 5 Mg Tbec (Bisacodyl) .... Take 1 tablet by mouth once a day as needed 8)  Colchicine 0.6 Mg Tabs (Colchicine) .... One by mouth once daily 9)  Cozaar 100 Mg Tabs (Losartan potassium) .... Take 1 tablet by mouth once a day  Other Orders: T-Lipid Profile ([id number])  Patient Instructions: 1)  Please schedule a follow-up appointment in 3 months. 2)  Our office will contact you re:  podiatry referral and vascular clinic referral   Orders Added: 1)  T-Uric Acid (Blood) [[id number]] 2)  T-BNP  (B Natriuretic Peptide) [83880-55185] 3)  T-Basic Metabolic Panel [80048-22910] 4)  T-Hepatic Function [80076-22960] 5)  T-Lipid Profile [80061-22930] 6)  Vascular Clinic [Vascular] 7)  Podiatry Referral [Podiatry] 8)  Est. Patient Level III [59563]    Current Allergies (reviewed today): ! NEOSPORIN

## 2010-10-31 NOTE — Letter (Signed)
   [place] [facility] [address] [place], [place]  [address]  [place] Phone: [phone]      September 20, 2010   [name] [address] [place], [place] [address]  RE:  LAB RESULTS  Dear  Ms. Witthuhn,  The following is an interpretation of your most recent lab tests.  Please take note of any instructions provided or changes to medications that have resulted from your lab work.  ELECTROLYTES:  Good - no changes needed  KIDNEY FUNCTION TESTS:  Stable - no changes needed  LIPID PANEL:  Good - no changes needed Triglyceride: 113   Cholesterol: 162   LDL: 85   HDL: 54   Chol/HDL%:  3.0 Ratio   Uric acid level - normal       Sincerely Yours,    Dr. Thomos Lemons  Appended Document:  Mailed.

## 2010-11-13 ENCOUNTER — Encounter (INDEPENDENT_AMBULATORY_CARE_PROVIDER_SITE_OTHER): Payer: Medicare Other | Admitting: Vascular Surgery

## 2010-11-13 ENCOUNTER — Encounter (INDEPENDENT_AMBULATORY_CARE_PROVIDER_SITE_OTHER): Payer: Medicare Other

## 2010-11-13 DIAGNOSIS — I83893 Varicose veins of bilateral lower extremities with other complications: Secondary | ICD-10-CM

## 2010-11-13 DIAGNOSIS — I872 Venous insufficiency (chronic) (peripheral): Secondary | ICD-10-CM

## 2010-11-15 NOTE — Consult Note (Signed)
NEW PATIENT CONSULTATION  [name], SWIGER R DOB:  11/16/1940                                       11/13/2010 ZOXWR#:[id number]  The patient presents today for evaluation of longstanding venous hypertension.  She has longstanding progressive venous varicosities and has had markedly increased pain, particularly around her lower ankles and medial malleolus area.  She has been very actively wearing compression garments for many years.  This did give her some improvement in the past, but she is now having worsening symptoms.  She does have a history of venous ulcer treated at [facility] many years ago, and this finally healed after 2 years when she quit her job and had more elevation.  Her history is negative for DVT.  She does have a history of hypertension, prior myocardial infarction, history of congestive heart failure.  Does have history of pacemaker and stroke.  SOCIAL HISTORY:  She is single.  She is retired.  She does not drink alcohol or smoke.  FAMILY HISTORY:  Significant for hypertension in her mother and venous varicosities and hypertension in her father.  REVIEW OF SYSTEMS:  GENERAL:  No weight loss or gain. VASCULAR:  Positive for history of phlebitis, nonhealing ulcer, stroke. CARDIAC:  Shortness of breath with exertion. PULMONARY:  Wheezing. Review of systems otherwise negative.  PHYSICAL EXAMINATION:  Well-developed, well-nourished, moderately obese white female in no acute stress.  Blood pressure 192/98, pulse 105, respirations 24.  HEENT:  Normal.  Her radial and dorsalis pedis pulses are 2+ bilaterally.  Musculoskeletal:  Shows no major deformities or cyanosis.  Neurologic:  No focal weakness, paresthesias.  Skin:  Without any open ulcers or rashes.  She does have induration of both ankles and marked varicosities, most particularly from her medial calves bilaterally.  She underwent noninvasive vascular laboratory studies in our office,  and this shows mild reflux in her deep system.  She does have extensive reflux throughout her great saphenous veins bilaterally from saphenofemoral junction distally.  I discussed this at length with the patient.  She has worn knee-high compression religiously.  We have written her a prescription today for thigh-high compression.  I plan to see her again in 3 months for continued discussion.  I do feel she would be an excellent candidate for staged laser ablation of her great saphenous veins bilaterally should she fail conservative treatment.    Larina Earthly, M.D.  TFE/MEDQ  D:  11/13/2010  T:  11/14/2010  Job:  [id number]  cc:   Barbette Hair. Artist Pais, DO

## 2010-11-25 ENCOUNTER — Encounter (INDEPENDENT_AMBULATORY_CARE_PROVIDER_SITE_OTHER): Payer: Medicare Other | Admitting: Internal Medicine

## 2010-11-25 ENCOUNTER — Encounter: Payer: Self-pay | Admitting: Internal Medicine

## 2010-11-25 ENCOUNTER — Other Ambulatory Visit: Payer: Self-pay | Admitting: Internal Medicine

## 2010-11-25 DIAGNOSIS — I5042 Chronic combined systolic (congestive) and diastolic (congestive) heart failure: Secondary | ICD-10-CM

## 2010-11-25 DIAGNOSIS — R0602 Shortness of breath: Secondary | ICD-10-CM

## 2010-11-25 DIAGNOSIS — I509 Heart failure, unspecified: Secondary | ICD-10-CM

## 2010-11-25 DIAGNOSIS — I5043 Acute on chronic combined systolic (congestive) and diastolic (congestive) heart failure: Secondary | ICD-10-CM | POA: Insufficient documentation

## 2010-11-25 DIAGNOSIS — E875 Hyperkalemia: Secondary | ICD-10-CM

## 2010-11-25 DIAGNOSIS — Z95810 Presence of automatic (implantable) cardiac defibrillator: Secondary | ICD-10-CM

## 2010-11-25 DIAGNOSIS — I469 Cardiac arrest, cause unspecified: Secondary | ICD-10-CM

## 2010-11-25 LAB — BASIC METABOLIC PANEL
CO2: 23 mEq/L (ref 19–32)
Chloride: 108 mEq/L (ref 96–112)
GFR: 43.19 mL/min — ABNORMAL LOW (ref >60.00)
Glucose, Bld: 109 mg/dL — ABNORMAL HIGH (ref 70–99)
Potassium: 4.2 mEq/L (ref 3.5–5.1)

## 2010-11-25 LAB — BRAIN NATRIURETIC PEPTIDE: Pro B Natriuretic peptide (BNP): 1343.2 pg/mL — ABNORMAL HIGH (ref 0.0–100.0)

## 2010-11-26 NOTE — Procedures (Unsigned)
LOWER EXTREMITY VENOUS REFLUX EXAM  INDICATION:  Painful varicose veins.  EXAM:  Using color-flow imaging and pulse Doppler spectral analysis, the bilateral common femoral, superficial femoral, popliteal, posterior tibial, greater and lesser saphenous veins are evaluated.  There is evidence suggesting deep venous insufficiency in the right lower extremity.  The bilateral saphenofemoral junctions are not competent with Reflux of >547milliseconds. The bilateral GSV's are not competent with Reflux of >583milliseconds with the caliber as described below.  The bilateral proximal short saphenous veins demonstrate competency.  GSV Diameter (used if found to be incompetent only)                                           Right    Left Proximal Greater Saphenous Vein           0.95 cm  1.1 cm Proximal-to-mid-thigh                     0.82 cm  0.78 cm Mid thigh                                 0.91 cm  0.77 cm Mid-distal thigh                          cm       cm Distal thigh                              1.1 cm   0.76 cm Knee                                      0.82 cm  0.85 cm  IMPRESSION: 1. Bilateral greater saphenous veins are not competent. 2. The bilateral greater saphenous veins are not tortuous. 3. The deep venous system is not competent on the right. 4. The bilateral short saphenous veins are competent.  ___________________________________________ Larina Earthly, M.D.  LT/MEDQ  D:  11/13/2010  T:  11/13/2010  Job:  [id number]

## 2010-11-27 ENCOUNTER — Encounter: Payer: Self-pay | Admitting: Internal Medicine

## 2010-12-05 NOTE — Cardiovascular Report (Signed)
Summary: Office Visit   Office Visit   Imported By: Roderic Ovens 11/28/2010 16:12:58  _____________________________________________________________________  External Attachment:    Type:   Image     Comment:   External Document

## 2010-12-05 NOTE — Miscellaneous (Signed)
Summary: Mammogram  Clinical Lists Changes  Observations: Added new observation of MAMMOGRAM: normal (11/25/2010 8:10)      Preventive Care Screening  Mammogram:    Date:  11/25/2010    Results:  normal

## 2010-12-05 NOTE — Assessment & Plan Note (Signed)
Summary: f1y/ ok per Gunnar Fusi   Visit Type:  Follow-up Primary Provider:  DThomos Lemons DO  CC:  chest heaviness x 2 weeks.  History of Present Illness:    [name] is seen in followup for aborted cardiac arrest with the implanted ICD. She also has a non ischemic cardiomyopathy with moderate aortic insufficiency and  with permanent atrial fibrillation.   She complains of periodic SOB; this is fequently but not always exertional and can also be associated wtih cheast heaviness  The last cath I could find was 2002 without obstructive disease   Her most recent echo   from 2011 demonstrated left ventricular dysfunction that was mild-40-50% with progressive aortic insufficiency and now aortic stenosis. The form was described as severe in the latter is moderate.left ventricular dimensions were 44 and 60  Her dypnea is extremely limiting; she has modest edema  he also has periodic edema which correlates in part with her shortness of breath. She adjust s her Lasix on an as-needed basis to accommodate this. Her diet is full salt.  laboratories from her December were reviewed with the BE and PEF 780 and a BUN/creatinine of 33/1.[name]5  Current Medications (verified): 1)  Aspirin 325 Mg Tabs (Aspirin) .... Take 1 Tablet By Mouth Once A Day 2)  Klor-Con M20 20 Meq Cr-Tabs (Potassium Chloride Crys Cr) .... Take 1 Tablet By Mouth Once A Day 3)  Metoprolol Tartrate 50 Mg Tabs (Metoprolol Tartrate) .... Take 1 Tablet By Mouth Once A Day 4)  Furosemide 40 Mg Tabs (Furosemide) .... Take 1 1/2  Tablet By Mouth Once A Day 5)  Allopurinol 300 Mg Tabs (Allopurinol) .... One By Mouth Once Daily 6)  Crestor 5 Mg Tabs (Rosuvastatin Calcium) .... Take 1 Tablet By Mouth Once A Day 7)  Dulcolax 5 Mg Tbec (Bisacodyl) .... Take 1 Tablet By Mouth Once A Day As Needed 8)  Colchicine 0.6 Mg Tabs (Colchicine) .... One By Mouth Once Daily 9)  Cozaar 100 Mg Tabs (Losartan Potassium) .... Take 1 Tablet By Mouth Once A  Day  Allergies (verified): 1)  ! Neosporin  Vital Signs:  Patient profile:   70 year old female Height:      62 inches Weight:      207 pounds BMI:     38.00 Pulse rate:   71 / minute BP sitting:   138 / 66  (left arm) Cuff size:   large  Vitals Entered By: Hardin Negus, RMA (November 25, 2010 10:47 AM)  Physical Exam  General:  Well developed, well nourished, in moderate respiratory distress.moderately obese Head:  normal HEENT Neck:  10-12 cm; supple Chest Wall:  mild kyphosis Lungs:  decreased breath sounds Heart:  regular rate and rhythm with a3/6 systolic ejection murmur and a 2/4 prolonged diastolic murmur Abdomen:  protuberant but soft with active bowel sounds Msk:  somewhat wide-based gait but no obvious musculoskeletal deformities Extremities:  trace edema Neurologic:  alert and oriented   EKG  Procedure date:  11/25/2010  Findings:      atrial fibrillation at 71 Intervals-/0.12/0.44 Axis is -70   ICD Specifications Following MD:  Sherryl Manges, MD     ICD Vendor:  [employer]     ICD Model Number:  [id number]     ICD Serial Number:  [id number] ICD DOI:  07/18/2008     ICD Implanting MD:  Sherryl Manges, MD  Lead 1:    Location: RV     DOI: 07/18/2008  Model #: [id number]     Serial #: [id number]     Status: active  Indications::  CARDIAC ARREST   ICD Follow Up ICD Dependent:  No      Episodes Coumadin:  No  Brady Parameters Mode VVI     Lower Rate Limit:  50      Tachy Zones VF:  207     VT:  182     VT1:  OFF     Impression & Recommendations:  Problem # 1:  COMBINED HEART FAILURE, ACUTE ON CHRONIC (ICD-428.43)  she is very symptomatic. Will measure her BNP today. She will adjust her diuretics as per her routine. (See below) Her updated medication list for this problem includes:    Aspirin 325 Mg Tabs (Aspirin) .[name]... Take 1 tablet by mouth once a day    Metoprolol Tartrate 50 Mg Tabs (Metoprolol tartrate) .[name]... 1/2 tab once daily    Furosemide 40 Mg  Tabs (Furosemide) .[name]... Take 1 1/2  tablet by mouth once a day    Cozaar 100 Mg Tabs (Losartan potassium) .[name]... Take 1 tablet by mouth once a day    Amlodipine Besylate 5 Mg Tabs (Amlodipine besylate) .[name]... 1 once daily  Her updated medication list for this problem includes:    Aspirin 325 Mg Tabs (Aspirin) .[name]... Take 1 tablet by mouth once a day    Metoprolol Tartrate 50 Mg Tabs (Metoprolol tartrate) .[name]... Take 1 tablet by mouth once a day    Furosemide 40 Mg Tabs (Furosemide) .[name]... Take 1 1/2  tablet by mouth once a day    Cozaar 100 Mg Tabs (Losartan potassium) .[name]... Take 1 tablet by mouth once a day  Problem # 2:  AORTIC REGURGITATION- SEVERE (ICD-424.1) she has severe aortic insufficiency which has been progressive. She has depressed left ventricular function. She may well be a candidate for aortic valve replacement. In the interim we will begin her on vasodilation with amlodipine and decrease her beta blocker as this has been apparently associated with prolonged diastolic intervals and more pronounced regurgitation. She may well be a candidate for  digoxin. Her updated medication list for this problem includes:    Metoprolol Tartrate 50 Mg Tabs (Metoprolol tartrate) .[name]... 1/2 tab once daily    Furosemide 40 Mg Tabs (Furosemide) .[name]... Take 1 1/2  tablet by mouth once a day    Cozaar 100 Mg Tabs (Losartan potassium) .[name]... Take 1 tablet by mouth once a day  Problem # 3:  ATRIAL FIBRILLATION (ICD-427.31)  stable; we need to review I did she's not on Coumadin. I'll she "had a stroke on Coumadin" Her updated medication list for this problem includes:    Aspirin 325 Mg Tabs (Aspirin) .[name]... Take 1 tablet by mouth once a day    Metoprolol Tartrate 50 Mg Tabs (Metoprolol tartrate) .[name]... 1/2 tab once daily  Orders: EKG w/ Interpretation (93000)  Her updated medication list for this problem includes:    Aspirin 325 Mg Tabs (Aspirin) .[name]... Take 1 tablet by mouth once a day    Metoprolol  Tartrate 50 Mg Tabs (Metoprolol tartrate) .[name]... Take 1 tablet by mouth once a day  Problem # 4:  ICD - IN SITU-[employer]TR[employer]IC  D154VWC (ICD-V45.02) Device parameters and data were reviewed and no changes were made  Other Orders: TLB-BMP (Basic Metabolic Panel-BMET) (80048-METABOL) TLB-BNP (B-Natriuretic Peptide) (83880-BNPR)  Patient Instructions: 1)  Your physician recommends that you return for lab work ZO:XWRUE BMET BNP 428.0 V58.69 2)  Your  physician wants you to follow-up in: 3 MONTHS  WITH KRISTIN AND PAULA YEAR WITH DR Logan Bores will receive a reminder letter in the mail two months in advance. If you don't receive a letter, please call our office to schedule the follow-up appointment. 3)  Your physician has recommended you make the following change in your medication: DECREASE METOPROLOL TO 25 MG once daily AND START AMLODIPINE 5 MG 1 once daily  Prescriptions: AMLODIPINE BESYLATE 5 MG TABS (AMLODIPINE BESYLATE) 1 once daily  #30 x 11   Entered by:   Scherrie Bateman, LPN   Authorized by:   Nathen May, MD, [facility]   Signed by:   Scherrie Bateman, LPN on 16/06/9603   Method used:   Electronically to        [id number] (retail)       [address]       [place], [place]  [address]       Ph: [phone] or [phone]       Fax: [phone]   RxID:   [id number]

## 2010-12-10 ENCOUNTER — Ambulatory Visit (INDEPENDENT_AMBULATORY_CARE_PROVIDER_SITE_OTHER): Payer: Medicare Other | Admitting: Internal Medicine

## 2010-12-10 ENCOUNTER — Encounter: Payer: Self-pay | Admitting: Internal Medicine

## 2010-12-10 ENCOUNTER — Ambulatory Visit (INDEPENDENT_AMBULATORY_CARE_PROVIDER_SITE_OTHER): Payer: Medicare Other | Admitting: Cardiology

## 2010-12-10 DIAGNOSIS — R0602 Shortness of breath: Secondary | ICD-10-CM

## 2010-12-10 DIAGNOSIS — I359 Nonrheumatic aortic valve disorder, unspecified: Secondary | ICD-10-CM

## 2010-12-10 DIAGNOSIS — M79609 Pain in unspecified limb: Secondary | ICD-10-CM

## 2010-12-10 DIAGNOSIS — I4891 Unspecified atrial fibrillation: Secondary | ICD-10-CM

## 2010-12-10 LAB — CONVERTED CEMR LAB
BUN: 25 mg/dL — ABNORMAL HIGH (ref 6–23)
CO2: 22 meq/L (ref 19–32)
Glucose, Bld: 78 mg/dL (ref 70–99)
HCT: 43.3 % (ref 36.0–46.0)
Hemoglobin: 14.1 g/dL (ref 12.0–15.0)
MCV: 92.5 fL (ref 78.0–100.0)
Potassium: 4.2 meq/L (ref 3.5–5.3)
RBC: 4.68 M/uL (ref 3.87–5.11)
Sodium: 144 meq/L (ref 135–145)
Total CK: 67 units/L (ref 7–177)
WBC: 5.6 10*3/uL (ref 4.0–10.5)

## 2010-12-10 NOTE — Letter (Signed)
Summary: [facility]   Imported By: Kassie Mends 12/04/2010 11:06:42  _____________________________________________________________________  External Attachment:    Type:   Image     Comment:   External Document

## 2010-12-11 ENCOUNTER — Encounter: Payer: Self-pay | Admitting: Internal Medicine

## 2010-12-11 LAB — CONVERTED CEMR LAB
Cholesterol: 173 mg/dL (ref 0–200)
LDL Cholesterol: 79 mg/dL (ref 0–99)
Total Bilirubin: 0.6 mg/dL (ref 0.3–1.2)
Total CHOL/HDL Ratio: 2.9
Total Protein: 7.4 g/dL (ref 6.0–8.3)

## 2010-12-12 ENCOUNTER — Telehealth: Payer: Self-pay | Admitting: Internal Medicine

## 2010-12-12 ENCOUNTER — Encounter: Payer: Self-pay | Admitting: Internal Medicine

## 2010-12-12 DIAGNOSIS — R748 Abnormal levels of other serum enzymes: Secondary | ICD-10-CM | POA: Insufficient documentation

## 2010-12-16 ENCOUNTER — Other Ambulatory Visit: Payer: Self-pay | Admitting: Internal Medicine

## 2010-12-16 DIAGNOSIS — R748 Abnormal levels of other serum enzymes: Secondary | ICD-10-CM

## 2010-12-23 ENCOUNTER — Other Ambulatory Visit: Payer: Self-pay | Admitting: Cardiology

## 2010-12-23 ENCOUNTER — Other Ambulatory Visit (HOSPITAL_COMMUNITY): Payer: Self-pay | Admitting: Radiology

## 2010-12-23 ENCOUNTER — Other Ambulatory Visit (HOSPITAL_COMMUNITY): Payer: Medicare Other

## 2010-12-23 ENCOUNTER — Ambulatory Visit
Admission: RE | Admit: 2010-12-23 | Discharge: 2010-12-23 | Disposition: A | Discharge status: Home / Self Care | Payer: Medicare Other | Source: Ambulatory Visit | Attending: Cardiology | Admitting: Cardiology

## 2010-12-23 ENCOUNTER — Ambulatory Visit (HOSPITAL_COMMUNITY): Payer: Medicare Other | Attending: Cardiovascular Disease | Admitting: Radiology

## 2010-12-23 DIAGNOSIS — R0609 Other forms of dyspnea: Secondary | ICD-10-CM | POA: Insufficient documentation

## 2010-12-23 DIAGNOSIS — Z8673 Personal history of transient ischemic attack (TIA), and cerebral infarction without residual deficits: Secondary | ICD-10-CM | POA: Insufficient documentation

## 2010-12-23 DIAGNOSIS — I350 Nonrheumatic aortic (valve) stenosis: Secondary | ICD-10-CM

## 2010-12-23 DIAGNOSIS — R0989 Other specified symptoms and signs involving the circulatory and respiratory systems: Secondary | ICD-10-CM | POA: Insufficient documentation

## 2010-12-23 DIAGNOSIS — I359 Nonrheumatic aortic valve disorder, unspecified: Secondary | ICD-10-CM | POA: Insufficient documentation

## 2010-12-23 DIAGNOSIS — E785 Hyperlipidemia, unspecified: Secondary | ICD-10-CM | POA: Insufficient documentation

## 2010-12-23 DIAGNOSIS — R5381 Other malaise: Secondary | ICD-10-CM | POA: Insufficient documentation

## 2010-12-23 DIAGNOSIS — I4891 Unspecified atrial fibrillation: Secondary | ICD-10-CM

## 2010-12-23 DIAGNOSIS — E7800 Pure hypercholesterolemia, unspecified: Secondary | ICD-10-CM | POA: Insufficient documentation

## 2010-12-23 DIAGNOSIS — R609 Edema, unspecified: Secondary | ICD-10-CM | POA: Insufficient documentation

## 2010-12-23 DIAGNOSIS — I10 Essential (primary) hypertension: Secondary | ICD-10-CM | POA: Insufficient documentation

## 2010-12-23 DIAGNOSIS — I509 Heart failure, unspecified: Secondary | ICD-10-CM | POA: Insufficient documentation

## 2010-12-23 DIAGNOSIS — I872 Venous insufficiency (chronic) (peripheral): Secondary | ICD-10-CM | POA: Insufficient documentation

## 2010-12-24 ENCOUNTER — Telehealth: Payer: Self-pay | Admitting: *Deleted

## 2010-12-24 NOTE — Telephone Encounter (Signed)
Advised daughter and patient of labs and echo

## 2010-12-24 NOTE — Telephone Encounter (Signed)
Message copied by Regis Bill on Tue Dec 24, 2010  4:47 PM ------      Message from: [name]      Created: Tue Dec 24, 2010  9:49 AM       Please report.  The echocardiogram shows moderate but not severe aortic insufficiency.  We will continue medical therapy for the time being.  We can discuss surgical options again at the next office visit.

## 2010-12-26 ENCOUNTER — Encounter (HOSPITAL_COMMUNITY)
Admission: RE | Admit: 2010-12-26 | Discharge: 2010-12-26 | Disposition: A | Discharge status: Home / Self Care | Payer: Medicare Other | Source: Ambulatory Visit | Attending: Internal Medicine | Admitting: Internal Medicine

## 2010-12-26 DIAGNOSIS — R748 Abnormal levels of other serum enzymes: Secondary | ICD-10-CM | POA: Insufficient documentation

## 2010-12-26 MED ORDER — TECHNETIUM TC 99M MEDRONATE IV KIT
25.0000 | PACK | Freq: Once | INTRAVENOUS | Status: AC | PRN
  Administered 2010-12-26: 25 via INTRAVENOUS

## 2010-12-26 NOTE — Progress Notes (Signed)
Summary: Lab Results  Phone Note Outgoing Call   Summary of Call: call pt - blood test shows elevated alkaline phosphatase.   this enzyme can be elevated in either liver abnormality or bone abnormality additional lab test GGT is normal which suggests this is likely coming from bone. I suggest pt have bone scan ( plz schedule) I would like to see pt back in office within 2 weeks after bone scan pt also needs the following blood work before her next appt TSH,  intact PTH - use elevated alk phos code  Initial call taken by: D. Thomos Lemons DO,  December 12, 2010 12:26 PM  Follow-up for Phone Call        call placed to patient at [phone], patients husband stated patient was not available. Message was left for patient to return call. Follow-up by: Glendell Docker CMA,  December 12, 2010 2:06 PM  Additional Follow-up for Phone Call Additional follow up Details #1::        call placed to patient [phone],she was informed per Dr Artist Pais instructions, and has verbalized understanding Additional Follow-up by: Glendell Docker CMA,  December 16, 2010 9:47 AM  New Problems: ALKALINE PHOSPHATASE, ELEVATED (ICD-790.5)   New Problems: ALKALINE PHOSPHATASE, ELEVATED (ICD-790.5)

## 2010-12-30 LAB — POCT I-STAT, CHEM 8
BUN: 22 mg/dL (ref 6–23)
Glucose, Bld: 99 mg/dL (ref 70–99)
HCT: 44 % (ref 36.0–46.0)
Hemoglobin: 15 g/dL (ref 12.0–15.0)
Potassium: 4.4 mEq/L (ref 3.5–5.1)
Sodium: 138 mEq/L (ref 135–145)
TCO2: 27 mmol/L (ref 0–100)

## 2010-12-30 LAB — CALCIUM: Calcium: 8.8 mg/dL (ref 8.4–10.5)

## 2010-12-30 LAB — CK: Total CK: 56 U/L (ref 7–177)

## 2010-12-31 NOTE — Assessment & Plan Note (Signed)
Summary: 3 month follow up/mhf   Vital Signs:  Patient profile:   70 year old female Height:      62 inches Weight:      202.25 pounds BMI:     37.13 Temp:     97.5 degrees F oral Resp:     22 per minute BP sitting:   110 / 68  (left arm) Cuff size:   large  Vitals Entered By: Glendell Docker CMA (December 10, 2010 10:56 AM) CC: 3 Month follow up Is Patient Diabetic? No Pain Assessment Patient in pain? no      Comments discuss evaluation of gout   Primary Care Provider:  Dondra Spry DO  CC:  3 Month follow up.  History of Present Illness:  70 y/o female for follow up  int hx: pt seen podiatrist - she had inserts - some improvement  also seen by vein specialist pt has been usually wearing support hose has appt in May.  pt may be candidate for laser surgery but procedure is on hold due to aortic insufficiency  aortic insuff - getting worse.  dyspnea with minimal exertion  Preventive Screening-Counseling & Management  Alcohol-Tobacco     Smoking Status: never  Allergies: 1)  ! Neosporin  Past History:  Past Medical History: Aborted cardiac arrest Aortic insuffihciency  ATRIAL FIBRILLATION (ICD-427.31)   ICD - IN SITU (ICD-V45.02)   Medtronic  [id number] CONGESTIVE HEART FAILURE (ICD-428.0)  HYPOTENSION, UNSPECIFIED (ICD-458.9)  HYPERTENSION (ICD-401.9) HYPERLIPIDEMIA (ICD-272.4) VENOUS INSUFFICIENCY (ICD-459.81)   HYPERKALEMIA (ICD-276.7) CVA (ICD-434.91) GOUT (ICD-274.9) HEALTH MAINTENANCE EXAM (ICD-V70.0) Hx of bradycardia Hx of hemorrhagic stroke 1998 (precluding use of coumadin) Hx of DVT S/P IVC filter Hx of V Fib arrest 2000 (SP ICD)  Past Surgical History: ICD  8/02 - Rudean Hitt Colonoscopy 2001- Dr. Loreta Ave         Family History: Father - lung cancer, colon cancer, skin cancer Mother - arthritis, osteoporosis Diabetes - no Breast ca - no         Social History: Divorced - lives with oldest daughter 2 daughters 41 & 55 2  Sons 46 & 58   Retired - managed a Training and development officer for 23 yrs   Daughter - Sonny Dandy  Physical Exam  General:  alert.   Head:  normocephalic and atraumatic.   Lungs:  normal respiratory effort and normal breath sounds.   Heart:  normal rate, regular rhythm, and no gallop.  diastolic murmur   Impression & Recommendations:  Problem # 1:  SHORTNESS OF BREATH (ICD-786.05) Pt advised to follow up with her cardiologist for possible aortic valve surgery  Orders: T-Basic Metabolic Panel [phone]) T-CBC No Diff ([id number])  Problem # 2:  FOOT PAIN, BILATERAL (ICD-729.5) Assessment: Improved  Complete Medication List: 1)  Aspirin 325 Mg Tabs (Aspirin) .... Take 1 tablet by mouth once a day 2)  Klor-con M20 20 Meq Cr-tabs (Potassium chloride crys cr) .... Take 1 tablet by mouth once a day 3)  Metoprolol Tartrate 50 Mg Tabs (Metoprolol tartrate) .... 1/2 tab once daily 4)  Furosemide 40 Mg Tabs (Furosemide) .... Take 1 tablet by mouth once a day 5)  Allopurinol 300 Mg Tabs (Allopurinol) .... One by mouth once daily 6)  Crestor 5 Mg Tabs (Rosuvastatin calcium) .... Take 1 tablet by mouth once a day 7)  Dulcolax 5 Mg Tbec (Bisacodyl) .... Take 1 tablet by mouth once a day as needed 8)  Colchicine 0.6 Mg Tabs (Colchicine) .... One by  mouth once daily 9)  Cozaar 100 Mg Tabs (Losartan potassium) .... Take 1 tablet by mouth once a day 10)  Amlodipine Besylate 5 Mg Tabs (Amlodipine besylate) .[name].. 1 once daily  Other Orders: T-BNP  (B Natriuretic Peptide) ([id number]) T-Uric Acid (Blood) (09811-91478) T-CK Total (29562-13086)  Patient Instructions: 1)  Please schedule a follow-up appointment in 4 months.   Orders Added: 1)  T-Basic Metabolic Panel [[id number]] 2)  T-CBC No Diff [[id number]] 3)  T-BNP  (B Natriuretic Peptide) [83880-55185] 4)  T-Uric Acid (Blood) [84550-23180] 5)  T-CK Total [82550-23250] 6)  Est. Patient Level III [57846]    Current Allergies (reviewed  today): ! NEOSPORIN

## 2011-01-01 ENCOUNTER — Ambulatory Visit (INDEPENDENT_AMBULATORY_CARE_PROVIDER_SITE_OTHER): Payer: Medicare Other | Admitting: Cardiology

## 2011-01-01 ENCOUNTER — Encounter: Payer: Self-pay | Admitting: Cardiology

## 2011-01-01 DIAGNOSIS — I352 Nonrheumatic aortic (valve) stenosis with insufficiency: Secondary | ICD-10-CM | POA: Insufficient documentation

## 2011-01-01 DIAGNOSIS — I6350 Cerebral infarction due to unspecified occlusion or stenosis of unspecified cerebral artery: Secondary | ICD-10-CM

## 2011-01-01 DIAGNOSIS — I10 Essential (primary) hypertension: Secondary | ICD-10-CM

## 2011-01-01 DIAGNOSIS — I359 Nonrheumatic aortic valve disorder, unspecified: Secondary | ICD-10-CM

## 2011-01-01 NOTE — Progress Notes (Signed)
History of Present Illness: This pleasant 70 year old woman is seen for a followup office visit to go over her recent echocardiogram and chest x-ray results.  She has known aortic valve disease with a murmur of aortic stenosis and aortic insufficiency.  She is in chronic atrial fibrillation.  She has a past history of ventricular tachycardia and has an ICD in place.  The ICD has not ever fired.  The patient has a past history of a intracerebral hemorrhage and is therefore not on Coumadin.  She denies any thromboembolic symptoms or TIA symptoms.  She has occasional chest discomfort.  A cardiac catheterization in 2002 did not reveal any coronary artery disease.  She does have exertional dyspnea.  He has also been having a lot of problems with pain in her knees and ankles from osteoarthritis.  She's had problems with varicose veins and does try to wear support hose.  Current Outpatient Prescriptions  Medication Sig Dispense Refill  . allopurinol (ZYLOPRIM) 300 MG tablet Take 300 mg by mouth daily.        [name] amLODipine (NORVASC) 5 MG tablet Take 5 mg by mouth daily.        [name] aspirin 325 MG tablet Take 325 mg by mouth daily.        . colchicine 0.6 MG tablet Take 0.6 mg by mouth daily.        . furosemide (LASIX) 40 MG tablet Take 40 mg by mouth 2 (two) times daily.        . Hydrocod Polst-Chlorphen Polst (TUSSIONEX PENNKINETIC ER PO) Take by mouth as needed.        [name] losartan (COZAAR) 100 MG tablet Take 100 mg by mouth daily.        . metoprolol succinate (TOPROL-XL) 25 MG 24 hr tablet Take 25 mg by mouth daily.        . nitroGLYCERIN (NITROSTAT) 0.4 MG SL tablet Place 0.4 mg under the tongue every 5 (five) minutes as needed.        . potassium chloride SA (K-DUR,KLOR-CON) 20 MEQ tablet Take 20 mEq by mouth daily.        . rosuvastatin (CRESTOR) 5 MG tablet Take 5 mg by mouth daily.          Allergies  Allergen Reactions  . Latex   . Triple Antibiotic     Patient Active Problem List  Diagnoses    . HYPERLIPIDEMIA  . GOUT  . HYPERKALEMIA  . HYPERTENSION  . Atrial fibrillation  . CONGESTIVE HEART FAILURE  . CVA  . VENOUS INSUFFICIENCY  . BACK PAIN, LUMBAR  . FATIGUE  . MEMORY LOSS  . EDEMA  . FOOT PAIN, BILATERAL  . COMBINED HEART FAILURE, ACUTE ON CHRONIC  . SHORTNESS OF BREATH  . ALKALINE PHOSPHATASE, ELEVATED  . Aortic insufficiency and aortic stenosis    History  Smoking status  . Never Smoker   Smokeless tobacco  . Not on file    History  Alcohol Use     Family History  Problem Relation Age of Onset  . Hypertension Father   . Cancer Father     Review of Systems: Constitutional: no fever chills diaphoresis or fatigue or change in weight.  Head and neck: no hearing loss, no epistaxis, no photophobia or visual disturbance. Respiratory: No cough, shortness of breath or wheezing. Cardiovascular: No chest pain peripheral edema, palpitations. Gastrointestinal: No abdominal distention, no abdominal pain, no change in bowel habits hematochezia or melena. Genitourinary: No dysuria, no  frequency, no urgency, no nocturia. Musculoskeletal:No arthralgias, no back pain, no gait disturbance or myalgias. Neurological: No dizziness, no headaches, no numbness, no seizures, no syncope, no weakness, no tremors. Hematologic: No lymphadenopathy, no easy bruising. Psychiatric: No confusion, no hallucinations, no sleep disturbance.    Physical Exam: Filed Vitals:   01/01/11 0951  BP: 130/60  Pulse: 74  Weight is 202, unchanged.  The general appearance is a well-developed somewhat obese woman in no acute distress.Pupils equal and reactive.   Extraocular Movements are full.  There is no scleral icterus.  The mouth and pharynx are normal.  The neck is supple.  The carotids reveal no bruits.  The jugular venous pressure is normal.  The thyroid is not enlarged.  There is no lymphadenopathy.The chest is clear to percussion and auscultation. There are no rales or rhonchi.  Expansion of the chest is symmetrical.  The heart reveals grade 2/6 systolic ejection murmur of aortic stenosis and a grade 2/6 decrescendo murmur of aortic insufficiency.  No S3 gallop.  No rub.  The rhythm is irregular in atrial fibrillation.The abdomen is soft and nontender. Bowel sounds are normal. The liver and spleen are not enlarged. There Are no abdominal masses. There are no bruits.  Extremities reveal thick ankles but no significant edema.Strength is normal and symmetrical in all extremities.  There is no lateralizing weakness.  There are no sensory deficits.   Assessment / Plan: We talked with the patient and her daughterAbout medical versus surgical therapy of her aortic valve disease.  We discussed the fact that her chest x-ray is unchanged over the past 4 years and that her most recent echocardiogram did not show any significant progression of her aortic valve disease.  I think a lot of symptoms are Multifactorial including her exogenous obesity.  I have recommended that we continue medical therapy at this point and work hard on weight loss.  Continue same medication.  Recheck in 3 months for followup office visit and EKG.

## 2011-01-01 NOTE — Assessment & Plan Note (Signed)
She has a past history of intracerebral hemorrhage and is not on Coumadin.  She is on aspirin.

## 2011-01-01 NOTE — Assessment & Plan Note (Signed)
The patient's blood pressure has been remaining satisfactory on her current regimen she seems to be tolerating well

## 2011-01-01 NOTE — Assessment & Plan Note (Signed)
We reviewed with her the results of her recent echocardiogram which showed moderate aortic insufficiency.  She does have chronic dyspnea which is probably multifactorial.  Her chest x-ray shows no change since 2008 in terms of her heart size.  She does have two-pillow orthopnea but is not having any paroxysmal nocturnal dyspnea.  She's had mild peripheral edema.

## 2011-01-02 ENCOUNTER — Ambulatory Visit: Payer: Medicare Other | Admitting: Cardiology

## 2011-01-14 ENCOUNTER — Other Ambulatory Visit: Payer: Self-pay | Admitting: *Deleted

## 2011-01-14 DIAGNOSIS — E876 Hypokalemia: Secondary | ICD-10-CM

## 2011-01-14 MED ORDER — POTASSIUM CHLORIDE CRYS ER 20 MEQ PO TBCR: 20.0000 meq | EXTENDED_RELEASE_TABLET | Freq: Every day | ORAL | Status: DC

## 2011-01-14 NOTE — Telephone Encounter (Signed)
Refilled meds per fax request.  

## 2011-02-10 ENCOUNTER — Other Ambulatory Visit: Payer: Self-pay | Admitting: *Deleted

## 2011-02-10 MED ORDER — ALLOPURINOL 300 MG PO TABS: 300.0000 mg | ORAL_TABLET | Freq: Every day | ORAL | Status: DC

## 2011-02-10 NOTE — Telephone Encounter (Signed)
Faxed refill for allopurinol

## 2011-02-11 NOTE — Assessment & Plan Note (Signed)
[facility]                         ELECTROPHYSIOLOGY OFFICE NOTE   JIYAH, TORPEY                     MRN:          [id number]  DATE:07/05/2007                            DOB:          [birth year]    The patient was seen today in the clinic on July 05, 2007, for a  follow-up of her Medtronic model #[id number] GEM 3 VR.  Date of implant was  May 20, 2001, for cardiac arrest.  On interrogation of her device  today, her battery voltage is 2.58 with a charge time of 10.18 seconds.  R-waves measured 10 mV with a ventricular capture threshold 2 volts at  0.2 msec and a ventricular lead impedance of 661 ohms.  Shock impedance  was 17.  There were no episodes since last check.  Underlying rhythm  today was sinus rhythm at 61 beats per minute.  She is programmed to the  VVI mode at 60.  No changes were made in her parameters and she will be  seen again in three months' time.      Altha Harm, LPN  Electronically Signed      Duke Salvia, MD, [facility]  Electronically Signed   PO/MedQ  DD: 07/05/2007  DT: 07/05/2007  Job #: [id number]   cc:   [name], M.D.

## 2011-02-11 NOTE — Letter (Signed)
March 30, 2007    [name], M.D.  [address], [address]  [place],  [place] [address]   RE:  BIONCA, MCKEY  MRN:  [id number]  /  DOB:  [birth year]   Dear Elijah Birk:   Ms. Lockamy comes in.  She is status post ICD implantation for aborted  cardiac arrest (ACA).  She has had no intercurrent episodes of  lightheadedness.  She has no chest pain or shortness of breath.   MEDICATIONS:  Her medications are numerous and include both Cartia and  Norvasc as well as Diovan and metoprolol.   PHYSICAL EXAMINATION:  VITAL SIGNS:  On examination today, the automatic  blood pressure cuff caused her a great deal of discomfort, and so when  the C.N.A. tried to take it.  It hurt again on the other arm.  It was  not taken, so I took it.  I noticed with a small cuff it was 185.  With  the larger cuff it was about 160 or so/58.  What was striking to me  though was that as I took the blood pressure cuff up above about 160, it  was very uncomfortable for her, and I wonder whether she may not be one  of these patient's who has these very high systolic numbers that are  often lost by the auscultatory gap.  LUNGS:  Otherwise clear.  HEART:  The heart sounds were regular.  EXTREMITIES:  Without edema.   Interrogation of her Medtronic ICD demonstrates her battery voltage is  2.58 and stable.  R wave was 10 with impedance of 661 and a threshold of  2 volts at 0.2.   IMPRESSION:  1. Aborted cardiac arrest (ACA).  2. Normal left ventricular function.  3. Hypertension.  4. Complex medical regime.   Tom, in anticipation of her seeing you next week, I tried to simplify  her regime and give you a head start to see if maybe it would work.  I  have asked her to double her Norvasc from 2.5 to 5 and to stop her  Tiazac and see whether she would tolerate that and whether or not that  might help her medication tolerance.   We will plan to see her again in 3 months.  If there is anything I can  do, please do  not hesitate to contact me.    Sincerely,      Duke Salvia, MD, [facility]  Electronically Signed    SCK/MedQ  DD: 03/30/2007  DT: 03/30/2007  Job #: [id number]

## 2011-02-11 NOTE — Discharge Summary (Signed)
[name], [name]              ACCOUNT NO.:  [id number]   MEDICAL RECORD NO.:  [id number]          PATIENT TYPE:  INP   LOCATION:  1506                         FACILITY:  [facility]   PHYSICIAN:  Heather Harvest, MD    DATE OF BIRTH:  Oct 13, 1940   DATE OF ADMISSION:  09/04/2007  DATE OF DISCHARGE:  09/06/2007                               DISCHARGE SUMMARY   DISCHARGE DIAGNOSIS:  1. Acute bronchitis.  2. Acute renal failure.  3. Hypertension.  4. Atrial fibrillation.  5. Coronary artery disease status post ICD placement/history of atrial      fibrillation.  6. Hypokalemia.  7. Hemorrhagic CVA.   DISCHARGE MEDICATIONS:  1. Combivent two puffs three times daily times one week then q.6 h      p.r.n.  2. Mucinex 600 mg p.o. b.i.d. times six days.  3. Amlodipine 2.5 mg  p.o. daily.  4. Procardia XL 180 mg p.o. daily.  5. Metoprolol XL 50 mg p.o. daily.  6. Lipitor 40 mg p.o. daily.  7. Allopurinol 100 mg p.o. daily.  8. Diovan 320 mg p.o. daily.  9. Aspirin 325 mg p.o. daily.   DISPOSITION/FOLLOW UP:  The patient will be discharged home.  The  patient will follow-up with her Dr. Patty [name] per patient request early  next week.  The patient is to call to schedule a follow-up appointment.  A follow-up with basic metabolic profile will need to be checked to  follow up on the patient's renal function as the patient is not going to  be restarted on her Lasix.  Once the patient's renal function has  resolved, the patient's Lasix may be restarted per Dr. Patty [name].  Will  defer the dose of Lasix restarting to Dr. Patty [name].  The patient's  blood pressure will need to also be reassessed.  The patient has been  told to hold her Lasix and weigh herself daily.  If the patient gains 2  or more pounds, to restart her Lasix, and if the patient was to get  short of breath to restart her Lasix.  The patient is to call to  schedule an appointment to follow up with Dr. Patty [name] early next  week.   PROCEDURES PERFORMED:  A chest x-ray was performed on September 04, 2007,  which showed mild cardiac enlargement with bronchitis.   CONSULTATIONS:  None.   BRIEF ADMISSION HISTORY AND PHYSICAL:  Ms. [name] is a 70-year-  old Caucasian female history of hypertension, hemorrhagic CVA, MI who  presented with complaints of cold and cough.  The patient had stated  that on admission that for the past month prior to admission she had  been having a nonproductive cough with associated cold-like/upper  respiratory symptoms.  The patient had presented to her PCP and had  tried taking a cough medication as well as Avelox for some relief.  The  patient stated that she had felt better as long as she had continued  cough medication, but after she had run out of that, she continued to  have a cough again.  The patient again presented to her PCP  the morning  of admission, was sent to the ER for further workup.  The patient  reported some exertional shortness of breath and denies any orthopnea,  PND, and occasional pedal edema and denied any chest pain.  The patient  reported that she had a fever of 102 two weeks out prior to admission,  but no fever or chills since the.  The patient denied any nausea or  vomiting.  The patient denied any symptoms of allergy such as postnasal  drip, watery eyes, runny nose, although she does report that she had  similar symptoms a year ago during the same time that resolved after a  month.   PHYSICAL EXAMINATION:  VITAL SIGNS:  Temperature of 97.7, pulse 102,  respiratory rate 20, blood pressure 116/ 75, sating 91% on room air.  GENERAL:  The patient was in no acute distress.  HEENT: Normocephalic, atraumatic.  Pupils equal, round and reactive to  light.  Extraocular movements intact.  NECK:  Supple.  No lymphadenopathy and no thyromegaly.  CARDIOVASCULAR:  The patient had some bibasilar crackles bilaterally.  The patient also had in expiratory  wheezes,  mainly in anterior lung  fields and occasionally on the back.  JVD about 7 cm.  HEART:  Regular rate and rhythm, no murmurs, rubs or gallops.  ABDOMEN:  Soft, nontender, nondistended, positive bowel sounds.  EXTREMITIES:  No clubbing, cyanosis or edema.   LABORATORY DATA:  White count 8.2, hemoglobin 14.3, hematocrit 42.2,  platelets 226.  CK-MB was less than one, a troponin of 0.05.  sodium of  136, potassium 3.8, chloride 100, bicarb 26, BUN 18, creatinine was  1.45, glucose of 108.  Chest x-ray was unremarkable for any infiltrate  or effusion.   HOSPITAL COURSE:  #1 - ACUTE BRONCHITIS.  The patient was admitted to  telemetry.  The patient's chest x-ray was clear for any infiltrate or  edema, but on exam, the patient had some bibasilar crackles.  BNP was  obtained.  The patient was placed on azithromycin, was placed on some  nebulizer treatment and also placed on oxygen and Mucinex.  Labs were  obtained, and on admission, the patient had a BNP of 358.  The patient  was given some Lasix IV in which her BNP then improved.  On December 8,  when her BNP was rechecked, had improved to 134.  Cardiac enzymes were  cycled x3 which were all negative.  A urinalysis was also obtained, and  a urine culture was also negative at that time.  The patient was  maintained on these medications.  She continued to improve with  nebulizer treatments daily.  The patient was treated with a 5-day course  of azithromycin was also placed on Mucinex.  The patient nebulizer  treatment was then changed to Combivent three times daily as well as on  an as-needed basis.  The patient improved daily, and on the day of  discharge the patient was in stable and improved condition and very  close to her baseline.   #2 - ACUTE RENAL FAILURE.  The patient had initially presented with an  acute renal failure with creatinine of 1.45.  A baseline creatinine was  1.2.  It was felt this may have been secondary to  diuresis as well as  problem #1.  The patient's Lasix was then held after diuresing for 1-2  days.  The patient's creatinine started to improve after Lasix was held.  The patient's Lasix was then restarted, and the  patient's creatinine  worsened again.  The patient's Lasix will be held on discharge.  The  patient has been asked to weight herself daily, decreased salt intake.  If the patient gains 2 or more pounds, restart her Lasix, and the  patient will follow-up with Dr. Patty [name] at which time a B-met will  need to be reassessed to follow the patient's renal function and will  defer to restart on the patient's Lasix to Dr. Patty [name].   #3 - HYPOTENSION.  Stable throughout the hospitalization.  The patient  was maintained on her amlodipine and Procardia and Toprol as well as  Diovan and Lasix were held secondary to problem #2.  The patient's blood  pressure was stable throughout the hospitalization.  This will need to  be reassessed, and I will follow-up and medications titrated as needed.   #4 - ATRIAL FIBRILLATION.  Was stable throughout hospitalization.  The  patient was rate controlled on Toprol as well as Diltiazem.  The patient  was maintained on aspirin for anticoagulation.  She is not a candidate  for long-term anticoagulation secondary to a history of hemorrhagic CVA.  The patient's atrial fibrillation was stable throughout the  hospitalization and the patient was asymptomatic.  The rest of the  patient's chronic medical issues were stable throughout the  hospitalization.  The patient was maintained on her home medications.   #5 - HYPOKALEMIA.  On admission, the patient was found to be  hypokalemic.  On admission, the patient's potassium was repleted and on  day of admission the patient's potassium was 4.1.   DISCHARGE VITAL SIGNS:  The patient had a temperature of 97.1, blood  pressure 134/66, respiratory rate 18, pulse of 61, saturating 96% on  room air.   DISCHARGE  LABORATORIES:  Sodium 139, potassium 4.1, chloride 102, bicarb 30, BUN 29, creatinine  1.35, glucose of 84, calcium of 8.7.   CONDITION ON DISCHARGE:  The patient will be discharged in stable and  improved condition to follow up with Dr. Patty [name] per patient's  request.   It has been a pleasure taking care of [name].      Heather Harvest, MD  Electronically Signed     DT/MEDQ  D:  09/09/2007  T:  09/10/2007  Job:  [id number]   cc:   Bryan Lemma. Manus Gunning, M.D.  Fax: [phone]   [name], M.D.  Fax: [phone]

## 2011-02-11 NOTE — Assessment & Plan Note (Signed)
[facility]                         ELECTROPHYSIOLOGY OFFICE NOTE   Heather, [name]                     MRN:          [id number]  DATE:06/22/2008                            DOB:          [birth year]    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  She has  reached ERI.  She has had no intercurrent ICD discharges.   Her major complaint is fatigue and exercise intolerance.   CURRENT MEDICATIONS:  1. Cartia 180.  2. Norvasc 2.5.  3. Metoprolol 50.  4. Aspirin.  5. Diovan.  6. Potassium.  7. Lipitor.   PHYSICAL EXAMINATION:  VITAL SIGNS:  Her blood pressure 120/70 taken at  the wrist.  Her pulse was 64.  Her weight was 197 which is up 6 pounds  in last 5 months.  LUNGS:  Clear.  HEART:  Sounds were irregular with a 2/6 systolic murmur in a 3/6  diastolic murmur. I do not recall how long it extended into diastole.  ABDOMEN:  Soft.  EXTREMITIES:  Trace edema with a little bit of pretibial tenderness.   Interrogation of her Medtronic device demonstrated stable lead  parameters.  She is at [facility].  She was 45% ventricularly paced yesterday  and 65% over night and the electrocardiograms from April versus now  demonstrate 10/12 beats V paced today and 3/12 beats V paced before  raising the issue about chronotropic incompetence and more ventricular  pacing.   IMPRESSION:  1. Status post cardiac arrest.  2. 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  3. Increasing bradycardia resulting in increasing a ventricular pacing      question contributing to fatigue and poor exercise tolerance with      the former unaffected by beta-blocker trial and Cartia withholding.  4. Fatigue and poor exercise tolerance with the  former unaffected by      beta-blocker trial and Cartia withholding.   I think there is value in trying to let [name]'s heart rate pick  up.  We will plan to get a Holter monitor on her after we have cut her  metoprolol in half.  We will also get a echo to see if her LV function  has changed.  It may be that there is some role for resynchronization at  the time of generator replacement.   It is interesting also that she is on 2 calcium blockers and I am not  sure what that is for, but there is something that Dr. Patty Sermons will  may be able to help to clarify.     Duke Salvia, MD, [facility]  Electronically Signed    SCK/MedQ  DD: 06/22/2008  DT: 06/23/2008  Job #: [id number]   cc:   [name], M.D.

## 2011-02-11 NOTE — Op Note (Signed)
[name], [name]              ACCOUNT NO.:  [id number]   MEDICAL RECORD NO.:  [id number]          PATIENT TYPE:  OIB   LOCATION:  2899                         FACILITY:  [facility]   PHYSICIAN:  Duke Salvia, MD, FACCDATE OF BIRTH:  03-15-1941   DATE OF PROCEDURE:  07/18/2008  DATE OF DISCHARGE:                               OPERATIVE REPORT   PREOPERATIVE DIAGNOSES:  1. Previously implanted ICD for aborted cardiac arrest, now at end of      life; atrial fibrillation - permanent.  2. 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   POSTOPERATIVE DIAGNOSES:  1. Previously implanted ICD for aborted cardiac arrest, now at end of      life; atrial fibrillation - permanent.  2. 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   PROCEDURE:  Explantation of a previously implanted device, attempted  repositioning of the RV lead, and intraoperative high-voltage testing.   PROCEDURE IN DETAIL:  Following obtaining an informed consent, the  patient was brought to the Electrophysiology Laboratory and placed on  the fluoroscopic table in supine position.  After routine prep and  drape, lidocaine was infiltrated along the line of the previous incision  and carried down to the layer of the device pocket using sharp  dissection and electrocautery.  It was noted that the pocket had  migrated caudally and that the lead had prolapsed with a closed in turn  at the very cephalad aspect of the pocket.  Care was taken to free up  the device caudal to this and then the lead itself was freed up from the  encompassing tissue down to the layer of the collar.  The device was  explanted.  The lead collar was freed from the retaining sutures all of  which were in place, but there was a shelf of tissue below the lead  retaining sutures which I think had  allowed for this prolapsing to  occur.  Radiography had demonstrated that there was retraction of the LV  lead to some degree as well.   After freeing up the leads, I then used a stylet and advanced the lead  as I could but it appeared to catch about a centimeter in from where it  started.  It was reaffixed at this location.  Interrogation of this lead  demonstrated an R wave of 11.4, impedance of 776, threshold of 1.5 volts  at 0.5 milliseconds, current of threshold was 2.0 mA, and inspection  demonstrated no evidence of blood.  The lead was secured to the  prepectoral fascia.  The pocket was opened caudally to allow for  replacement of the device.  The lead and the pulse generator were placed  in the pocket and secured to the prepectoral fascia.  A high-voltage  shock was then undertaken.  A 1-joule shock was delivered through a  measured resistance of 47 ohms distally and  63 proximally.  It was  elected to do this because the patient's atrial fibrillation was not  anticoagulated (see above) and the possibility of IVC occlusion at the  site of her filter was not zero.  Given the fact that her ejection  fracture was quite high, the thought was that her DFT would certainly be  within range of her device.   The lead and pulse generator having been secured were then closed in the  pocket which was closed in 3 layers in a normal fashion.  The wound was  washed, dried, and a benzoin and Steri-Strip dressing was applied.  Needle counts, sponge counts, and instrument counts were correct at the  end of procedure according to the staff.  The patient tolerated the  procedure without apparent complication.      Duke Salvia, MD, [facility]  Electronically Signed     SCK/MEDQ  D:  07/18/2008  T:  07/19/2008  Job:  [id number]   cc:   [name], M.D.  Electrophysiology Laboratory

## 2011-02-11 NOTE — Assessment & Plan Note (Signed)
[facility]                         ELECTROPHYSIOLOGY OFFICE NOTE   [name], [name]                     MRN:          [id number]  DATE:11/10/2008                            DOB:          19-May-1941    [name] comes in for followup for cardiac arrest for which she has a  Medtronic defibrillator implanted which was changed out in September.  Her breathing has been stable.  There is no change in her energy level.  This is true notwithstanding the discontinuation of a couple of her  medications.   Her biggest complaint arose from when I squeeze her legs looking for  edema, she described longstanding problems with swelling of her lower  extremities.  The use of support stockings up to her waist in fact, and  distending knots that come up and mostly arose medially and laterally  on her veins.   Her medications today include aspirin, Diovan, allopurinol, amlodipine,  metoprolol, Lipitor, and furosemide.   PHYSICAL EXAMINATION:  VITAL SIGNS:  Her blood pressure is mildly  elevated at 151/68.  This was somewhat anomalous.  Her weight was 202,  which is up 10 pounds in the last year.  LUNGS:  Clear.  HEART:  Heart sounds were irregular with a 2/6 systolic murmur and a 2/6  diastolic murmur.  ABDOMEN:  Soft.  EXTREMITIES:  Insignificant edema.  There was distended veins and venous  insufficiency changes in the lower extremities.   Interrogation of her Medtronic ICD demonstrated an R-wave of 12 with  impedance of 736.  The threshold was 1 volt at 0.4.  Battery voltage was  3.21.  There is a couple of episodes of atrial fibrillation that was  relatively fast.   IMPRESSION:  1. Cardiac arrest - aborted.  2. Status post implantable cardioverter-defibrillator for the above      for valvular cardiomyopathy with aortic insufficiency stenosis.  3. Atrial fibrillation - permanent.  4. Stroke precluding Coumadin.  5. Venous insufficiency.   [name] is doing fine from arrhythmia point of view.  I wonder  whether there was any benefit for her from risk benefit assessment to  adding Plavix based on the active W data.  I will defer this risk  assessment to Dr. Patty Sermons.   I told her I would talk with Dr. Excell Seltzer about any other therapy for  venous insufficiency particularly stockings.  I do not know whether any  of these injection therapies are valuable or not, but I will try to get  some insight too on that and get back with her about that.   We will see her again in 3 months' time for device followup.     Duke Salvia, MD, [facility]  Electronically Signed    SCK/MedQ  DD: 11/10/2008  DT: 11/10/2008  Job #: [id number]   cc:   [name], M.D.

## 2011-02-11 NOTE — H&P (Signed)
NAMEDELONNA, [name]              ACCOUNT NO.:  [id number]   MEDICAL RECORD NO.:  [id number]          PATIENT TYPE:  INP   LOCATION:  1506                         FACILITY:  [facility]   PHYSICIAN:  Heather Harvest, MD    DATE OF BIRTH:  [birth year]   DATE OF ADMISSION:  09/04/2007  DATE OF DISCHARGE:                              HISTORY & PHYSICAL   PCP:  Dr. Manus Gunning.   CHIEF COMPLAINT:  Cold.   DATE OF ADMISSION:  September 04, 2007.   HPI:  Patient is a 70 year old white female with history of  hypertension, CVA, myocardial infarction who presented with complaints  of cough and cold.  She tells me that for about last month she has been  having nonproductive cough and associated cold-like symptoms.  She  presented to her PCP and tried taking cough medication as well as Avelox  with some relief.  She said she felt better as long as she continued the  cough medication but after she ran out of them she started to have cough  again.  She again presented to her PCP the morning of admission and was  sent to the ER for further workup.  Patient reports some exertional  shortness of breath.  She denies any orthopnea, PND.  She occasional  pedal edema.  Denied any chest pain.  She reports that she did have  fever of 102 about 2 weeks ago but no fever or chills since then.  Denies any nausea, vomiting.  Denies any symptoms of allergies such as  postnasal drip, watery eyes, runny nose although she does report that  she had similar symptoms last year during the same time which resolved  after a month.   PAST MEDICAL HISTORY:  1. Hypotension.  2. CVA.  3. Myocardial infarction which was about 10 years ago.  4. Status post stent placement.  5. She also had ICD placement for unknown reason.   FAMILY HISTORY:  Patient's Dad had colon cancer and lung cancer.   SOCIAL HISTORY:  Negative x3.  Her daughter is [name], number is [phone].   DRUG ALLERGIES:  No known drug allergies.   MEDICATION:  1.  KCL 20 mEq p.o. daily.  2. Lasix 40 mg p.o. daily.  3. Amlodipine 2.5 mg p.o. daily.  4. Procardia XL 180 mg p.o. daily.  5. Metoprolol-XL 50 mg p.o. daily.  6. Lipitor 40 mg p.o. daily.  7. Allopurinol 100 mg p.o. daily.  8. Avelox 400 mg p.o. daily.  9. Diovan 320 mg p.o. daily.  10.Aspirin 325 mg p.o. daily.  11.Tussionex one tablespoon q.12 hours.   REVIEW OF SYMPTOMS:  Unremarkable for all 11 systems.   PHYSICAL EXAMINATION:  Temperature 97.7, pulse 102, respiratory rate 20,  blood pressure 116/75.  She was saturating at 91% on room air.  GENERAL:  No acute distress.  HEENT:  PERRLA, EOMI.  NECK:  No lymphadenopathy, no thyromegaly, JVD at about 7-8 cm.  CHEST:  She has bibasilar crackles bilaterally.  She has positive end-  expiratory wheezes mainly anteriorly and occasionally in the back.  HEART:  Regular rate  and rhythm.  No murmur, rubs or gallops.  ABDOMEN:  Soft, nontender, nondistended, normoactive bowel sounds.  EXTREMITIES:  No clubbing, cyanosis or edema.   LABORATORY DATA:  White count 8.2, H&H 14.3 and 42.4, platelets 226,000.  CK MB less than 1.  Troponin 0.05, sodium 136, potassium 3.8, chloride  100, bicarbonate 26, BUN 18, creatinine 1.45, glucose 108.  Chest x-ray  was unremarkable for any infiltration or effusion.   IMPRESSION:  A 70 year old white female with a history of hypertension,  cerebrovascular accident and myocardial infarction who was presented  with complaint of shortness of breath, admitted for bronchitis.  Problem:  1. Shortness of breath, cold.  Her symptoms of nonproductive cough in      the setting of season change is concerning for either viral      bronchitis versus other acute bronchitis.  Her chest x-ray is      fairly clear, there is no suspicion for active pneumonia.  We will      start patient on azithromycin to cover for atypical pneumonia.      Will give her Atrovent and albuterol nebulizer.  Will prescribe      cough  suppressant for symptomatic relief as well as I will start      patient on Allegra for possible allergic component.  She has      bibasilar crackles and does appear a little bit wet on her lung      exam.  Will give one dose of Lasix IV and see if it responds.  2. Hypotension.  Will continue outpatient medication.  3. Cerebrovascular accident.  Continue aspirin.  4. Myocardial infarction.  Continue metoprolol, aspirin, Procardia,      Lipitor.      Heather Donovan, MD      Heather Harvest, MD  Electronically Signed    MJ/MEDQ  D:  09/04/2007  T:  09/05/2007  Job:  [id number]

## 2011-02-11 NOTE — Letter (Signed)
January 19, 2008    [name], M.D.  [address], [address]  [place], [place] [address]   RE:  [name], [name]  MRN:  [id number]  /  DOB:  1941-05-02   Dear Elijah Birk:   [name] comes in followup for aborted cardiac arrest.  She is status  post ICD and has had normal LV function.  Her major complaint is that  she feels lousy.  There is a paucity of energy.  There is easy exercise  fatigue and as we noted she has normal LV function, but significant  hypertension.   Review of her medications demonstrates 2 potential culprits.  One is the  [place] and the other is metoprolol 50 which I presume is a succinate  formulation.  Her other medications include Diovan 320; Norvasc 2.5;  furosemide 40 as well as Lipitor and potassium.   On examination, her blood pressure today was 111/74.  The pulse of 64.  Her weight was 191.  Her lungs were clear.  Her heart sounds were  regular.  The extremities were without edema, but had significant venous  varicosities and they are quite tender.  In fact, I almost got kicked  when I touched her shin.   Interrogation of her Medtronic ICD demonstrates an R-wave of 7 with  impedance of 6, level of threshold 2 volts of 0.2.  Battery voltage 2.57  suggesting that she is approaching ERI.   IMPRESSION:  1. Aborted cardiac arrest.  2. Status post implantable cardioverter-defibrillator for the above.  3. Hypertension.  4. Generalized fatigue and malaise.   I have taken the liberty Elijah Birk of trying her on an experiment that is to  discontinue her [place] altogether for 2 weeks.  At that point, I have  given her a prescription for 3 different beta blockers.  One is Inderal  LA 60; atenolol 50 and nadolol 20.  She is to take them in  random order  to see if any of them might be associated with less fatigue than she is  currently experiencing.  She asked if we would come back and reassess in  2 months and so we will do that.   Hope this letter finds you  well.    Sincerely,      Duke Salvia, MD, [facility]  Electronically Signed    SCK/MedQ  DD: 01/19/2008  DT: 01/19/2008  Job #: [id number]

## 2011-02-12 ENCOUNTER — Ambulatory Visit: Payer: Medicare Other | Admitting: Vascular Surgery

## 2011-02-14 NOTE — Discharge Summary (Signed)
[facility]  Patient:    [name], [name]                     MRN: [id number] Adm. Date:  [id number] Disc. Date: [id number] Attending:  Miguel [name] CC:         [name], M.D.  [name], M.D.  [name], M.D.  Dr. Earlene [name], [facility]   Discharge Summary  DATE OF BIRTH:  06/08/1941  CONSULTS:  [name], M.D.  PROCEDURES:  Placement of inferior vena cava infrarenal filter.  DISCHARGE DIAGNOSES: 1. Right lower extremity deep venous thrombosis secondary to prolonged bed    rest and stroke-related hemiparesis. 2. Left intracerebral hemorrhage on September 28, 2000 (INR 4.0 on Coumadin).    Hospitalized September 28, 2000, through October 30, 2000. 3. Chronic atrial fibrillation, onset October of 2001, anticoagulated until    September 28, 2000. 4. Hypertension.  Severe in early 1990s for which renal arteriogram was    performed and was negative.  Nuclear scan negative one year ago.    Echocardiogram in October of 2001 with ejection fraction 50%, 2+ aortic    insufficiency, mild mitral regurgitation, and mild left ventricular    hypertrophy. 5. Exposure to passive smoke. 6. Exogenous obesity. 7. Family history of colon cancer with colonoscopy in July of 2001 with benign    polyps.  DISCHARGE MEDICATIONS: 1. Hydrochlorothiazide 12.5 mg p.o. q.d. 2. Lanoxin 0.25 mg p.o. q.d. 3. K-Dur 20 mEq p.o. q.d. 4. Toprol XL 50 mg p.o. q.d. 5. Enteric-coated aspirin 325 mg p.o. q.d.  HISTORY OF PRESENT ILLNESS:  [name] is a 70 year old woman who suffered a severe hemorrhagic CVA on New Years Eve day secondary to anticoagulation with mildly supratherapeutic Coumadin.  She underwent a prolonged hospitalization course, most recently discharged from the inpatient rehabilitation unit one week ago, after experiencing remarkable and ongoing recovery.  During the latter part of her hospitalization, she noted the gradual  onset of right lower extremity edema and tightness.  This persisted and worsened.  At her outpatient rehabilitation session on the day of admission, she pointed this out to the therapist and lower extremity Dopplers were performed, which revealed a right lower extremity deep venous thrombosis extending from distal superficial femoral into the calf.  She had not experienced chest pain or shortness of breath.  She is gaining some ambulatory ability and is able to walk with the assistance with a walker, but still has not recovered full strength on the right side.  She did receive prophylactic Lovenox injections during her hospital stay.  [name] primary care physician does not have admitting privileges and I was asked to intervene to arrange outpatient Lovenox.  When I met [name] and found out that she had hemorrhagic stroke and contraindication to such, she was hospitalized for placement of an IVC filter.  HOSPITAL COURSE:  Ms. [name] was admitted through the emergency room and underwent placement of infrarenal vena cava filter with no complications early in the admission.  Her leg was elevated and she was placed on bed rest.  Her home medications were continued.  Her cardiopulmonary status was stable and she exhibited no hypoxia nor findings suggestive of pulmonary embolism.  Her chest x-ray was clear.  I informed [name], M.D., on her hospitalization and the unfortunate clotting development so close to hemorrhagic events.  He discussed this extensively with his colleagues and the general consensus was that anticoagulation would be far  more dangerous than beneficial.  Similar consultations were held with the hematologist, cardiovascular surgeons, and neurosurgeons on an informal basis, with the same conclusion offered.  It is not optimal to have an IVC filter without ongoing anticoagulation and [name] is likely to extend her due to restasis, but she will  likely improve within two months and after two to four weeks would benefit from a thigh-high elastic compression stocking.  Her activity will not be limited aside from the avoidance of leg dependence and either sitting or standing postures.  Ambulation is permitted and she may resume her previous outpatient therapy appointments.  [name] will be arranging new primary care.  FOLLOW-UP:  She has a follow-up appointment with [name], M.D., for chronic atrial fibrillation and hypertension issues.  DISPOSITION:  She was discharged in stable condition. DD:  11/06/00 TD:  11/08/00 Job: [id number]

## 2011-02-14 NOTE — Discharge Summary (Signed)
[place]. [facility]  Patient:    [name], [name]                     MRN: [id number] Adm. Date:  [id number] Disc. Date: [id number] Attending:  Miguel Aschoff                           Discharge Summary  DATE OF BIRTH:  [birth year]  ADMISSION DIAGNOSES: 1. Left brain intracerebral hemorrhage. 2. Aphasia secondary to #1. 3. 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 4. 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 5. Coumadin induced coagulopathy. 6. History of atrial fibrillation. 7. History of hypertension.  DISCHARGE DIAGNOSES: 1. Intracerebral hemorrhage with resultant right hemiparesis, right homonomous    hemianopsia, aphasia, and dysarthria all improved. 2. Atrial fibrillation with slow then rapid ventricular response. 3. Coumadin induced coagulopathy, resolved. 4. Urinary tract infection. 5. Hypertension.  CONDITION ON DISCHARGE:  Fair.  DIET:  D3 with nectar thick liquids.  ACTIVITY:  Ad lib per rehabilitation team.  DISCHARGE MEDICATIONS: 1. Hydrochlorothiazide 12.5 mg p.o. q.d. 2. KCL 20 mEq p.o. b.i.d. 3. Digoxin 0.25 mg p.o. q.d. 4. Atenolol cream topically b.i.d. 5. Toprol XL 50 mg q.d. 6. Ensure vanilla pudding t.i.d.  STUDIES: 1. Multiple CTs of the head first performed December 31 at [facility] revealing a left basal ganglia intracerebral hemorrhage with    slight extension on CT of September 29, 2000, stable on CT of September 30, 2000,    slightly increased on CT of October 01, 2000, and resolving on CT of October 08, 2000. 2. Modified barium swallow October 03, 2000 with flash penetration with ______    barium improved with chin tuck. 3. Numerous EKGs revealing atrial fibrillation with a variable ventricular    response. 4. Echocardiogram performed October 01, 2000 revealing normal left systolic    function, mildly thickened aortic valve, and moderate aortic regurgitation. 5. Carotid Doppler performed September 30, 2000 revealing no internal carotid    artery stenosis and anterior grade vertebral flow. 6. Numerous CBCs normal on admission showing elevated white count from January    3-October 10, 2000 peaking at 15.7 on October 08, 2000 thought to be    secondary to steroid effect and later to urinary tract infection. 7. Multiple chemistries generally revealing mildly elevated glucose and BUN    levels with normal findings on October 11, 2000 except for a glucose of 143    and slightly elevated BUN of 33 with creatinine 1.2. 8. Urinalysis October 03, 2000 revealing many bacteria, large leukocyte    esterase.  HOSPITAL COURSE:  Please see admission H&P for complete admission details. Briefly, this is a 70 year old woman with history of hypertension, atrial fibrillation admitted with acute onset of right hemiparesis and aphasia.  She had history of systemic anticoagulation with Coumadin and INR on admission was 4.3.  CT revealed acute cerebral hemorrhage in the right basal ganglia area. She initially presented to [facility] Emergency Room, was subsequently transferred to [facility] for acute stroke care.  She was a little more lethargic on the morning of January 1 and repeat CT showed slight increase in the size of the hemorrhage.  She was initially treated with vitamin K on admission but due to the interval increase vitamin K was discontinued and fresh frozen plasma was administered along with steroids.  She was observed in the neurologic intensive  care unit and kept n.p.o.  On the second she was neurologically stable and her coagulopathy had been corrected.  She had some elevated blood pressures which were treated with labetalol.  Due to her n.p.o. status she was treated with intravenous digoxin for rate control.  CT of the head performed January 2 and January 3 showed relatively stable hematoma.  Pan to tube placement was attempted on January 2 and January 3 and fluoroscopic pan to tube placement was  also attempted on January 4 unsuccessfully, but ultimately on January 5 after reevaluation with speech therapy a diet was started which she tolerated well.  Cardiology was consulted and the patients cardiologist, Dr. Ronny Flurry, evaluated the patient on September 30, 2000 recommending labetalol for blood pressure control and rate control with digoxin.  She developed persistent bradycardia on January 2 and 3 occasionally requiring atropine.  For this reason digoxin was held on January 3 and January 4.  She had some decreased urine output on January 3 which responded to intravenous fluids.  She also had some agitation and was largely somnolent and confused while in the neurologic ICU.  The agitation responded to Haldol.  On the morning of the fifth she was considerably better, much more alert, able to converse and follow simple commands with an improved hemiparesis.  She was able to participate in a modified barium swallow and subsequently diet was started.  She was deemed stable for transfer to the floor on October 04, 2000. Heart rates returned to the normal range.  Digoxin was restarted. Rehabilitation service was consulted on October 05, 2000 for consideration of transfer to rehabilitation service.  Due to her persistent stroke risk from ongoing atrial fibrillation, possibilities for anticoagulation were discussed with Dr. Patty Sermons and the decision was to wait three weeks and start aspirin. All anticoagulation was held during the hospitalization.  Patient had some elevated blood pressures on October 05, 2000 which responded to labetalol.  She was noted on the morning of October 06, 2000 to have increased work of breathing with heart rate in the 140s which responded to intravenous Cardizem followed by Cardizem drip and she was transferred to telemetry floor.  She was lethargic in association with this and there was concern that she had extended her stroke.  However, she subsequently returned  to her neurologic baseline. She had an increase in her white count on October 08, 2000.  Chest x-ray revealed no active disease.  Repeat urinalysis again was remarkable again for  UTI.  This was treated with Tequin and improved.  IV Cardizem was gradually weaned, discontinued on October 08, 2000 and rate was controlled with Lanoxin and low dose Toprol.  She made gradual improvements over the next several days.  Physical and occupational therapy assisted in her rehabilitation.  She did have some tachycardia with increasing activity which was treated with a slight increase in the dose of Toprol.  Modified barium swallow was repeated on October 12, 2000 and this was stable.  She was accepted to the rehabilitation service and transferred to the subacute floor on October 13, 2000 having been deemed stable from neurologic and cardiac standpoints. Followup will be per the SACU team. DD:  12/21/00 TD:  12/22/00 Job: [id number]

## 2011-02-14 NOTE — Op Note (Signed)
[place]. [facility]  Patient:    [name], [name] Visit Number: [id number] MRN: [id number]          Service Type: MED Location: [phone] Attending Physician:  Rudean Hitt Proc. Date: 05/20/01 Adm. Date:  05/17/2001   CC:         [name] A. Patty Sermons, M.D.  Electrophysiology Lab  [employer], Attn: [facility]   Operative Report  PREOPERATIVE DIAGNOSIS:  Cardiac arrest/atrial fibrillation.  POSTOPERATIVE DIAGNOSIS:  Cardiac arrest/sinus rhythm.  PROCEDURE:  Contrast venography; defibrillator implantation with intraoperative defibrillation threshold testing; cardiac catheterization with pacemaker insertion.  DESCRIPTION OF PROCEDURE:  Following the obtainment of informed consent, the patient was brought to the cardiac catheterization laboratory and placed on the fluoroscopic table in the supine position.  After routine prep and drape of the left upper chest, intravenous contrast was injected via the left antecubital vein to identify the course and the patency of the extrathoracic left subclavian vein.  This having been accomplished, lidocaine was infiltrated in the prepectoral subclavicular region.  An incision was made and carried down to the layer of the prepectoral fascia using electrocautery.  A pocket was formed using electrocautery.  Hemostasis was obtained.  Thereafter, attention was turned to gaining access to the extrathoracic left subclavian vein, which was accomplished without difficulty and without the aspiration of air or puncture of the artery.  A guidewire was placed and retained and a 9-French tear-away introducer sheath was placed, through which was passed a Medtronic ______ Quattro dual-coil defibrillator lead, serial number [id number].  Under fluoroscopic guidance, it was manipulated to the right ventricular apex, where the bipolar R-wave was 8.3 mV with a pacing impedance of 1113 ohms and a pacing  threshold of 0.6 volts at 0.5 msec and a current of threshold of 0.4 mA and there was no diaphragmatic pacing at 10 volts.  With these acceptable parameters recorded, the lead was secured to the prepectoral fascia and then attached to a Medtronic Gem III VR, 7231 CX ICD, serial number [id number].  Through the device, the bipolar R-wave was 7.5 mV with an impedance of 1356 ohms and a pacing threshold at 1 volt and 0.2 msec.  With these acceptable parameters recorded, the pocket was placed in the pocket after it had been copiously irrigated with antibiotic-containing saline solution.  Hemostasis was assured prior to implantation.  Ventricular fibrillation was then induced via the T-wave shock.  After a total duration of 6 seconds, a 12-joule shock was delivered through a measured resistance of 39 ohms terminating ventricular fibrillation and resulting in sinus rhythm.  After a wait of 6-7 minutes, ventricular fibrillation was reintroduced via the T-wave shock.  After a total duration of 6 seconds, a 12-joule shock was again delivered now through a resistance of 41 ohms terminating ventricular fibrillation and restoring sinus rhythm.  With these acceptable parameters recorded, the system was implanted.  The defibrillator was secured to the prepectoral fascia.  The wound was closed in three layers in the normal fashion.  The wound was washed, dried and a benzoin/Steri-Strips dressing was then applied.  Needle counts and sponge counts and instrument counts were correct at the end of the procedure according to the staff.  At this juncture, it was elected to refibrillate the patient as she has an ______ contraindication to anticoagulation with a prior intercerebral hemorrhage on anticoagulation and after review with Dr. Patty Sermons, it was elected to proceed.  Cardiac catheterization was performed via the right femoral  vein.  A balloon tip catheter was placed in the right atrium.   Atrial fibrillation was reinduced via rapid atrial pacing.  The catheter and the sheath were then removed. Attending Physician:  Rudean Hitt DD:  05/20/01 TD:  05/20/01 Job: [id number]

## 2011-02-14 NOTE — Discharge Summary (Signed)
[place]. [facility]  Patient:    [name], HELVIE Visit Number: [id number] MRN: [id number]          Service Type: MED Location: [phone] Attending Physician:  Koren Bound Dictated by:   Viviana Simpler, M.D. Admit Date:  08/25/2001 Disc. Date: 08/27/01   CC:         Jimmye Norman, M.D.  Thomas A. Patty Sermons, M.D.  Danice Goltz, M.D.  Dyanne Carrel, M.D.   Discharge Summary  DATE OF BIRTH:  [birth year].  CONSULTATIONS:  Alvia Grove., M.D. (on call for Dr. Patty Sermons); Danice Goltz, M.D., pulmonary critical care; Jimmye Norman, M.D., general surgery.  PROCEDURE:  none.  DISCHARGE DIAGNOSES: 1. Right upper quadrant/chest pleuritic pain, etiology undetermined. 2. Chronic atrial fibrillation, onset October 2001, on Coumadin until January    2002. 3. Left hemorrhagic cerebrovascular accident January 2002, with INR 4,    resulting in right hemiparesis. 4. 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 5. Cardiac arrest observed in cardiologists office August 2002 (ventricular    tachycardia).    a. Automatic implanted cardioverter defibrillator August 2002 by Dr. Graciela Husbands.    b. Catheterization August 2002, normal coronaries, ejection fraction 45%,       aortic insufficiency 2+. 6. Hypertension, severe, uncontrolled in past. 7. One hundred pound semi-intended weight loss since January 2002. 8. Family history of colon cancer; personal history benign polyps by    colonoscopy July 2001.  DISCHARGE MEDICATIONS: 1. Enteric-coated aspirin 325 mg p.o. q.d. 2. Lopressor 50 mg one-half p.o. b.i.d. 3. Lasix 20 mg p.o. q.d. 4. Potassium 20 mEq p.o. b.i.d. 5. Tequin 400 mg p.o. q.d. x 5 days. 6. Vioxx 25 mg p.o. q.d. x 5 days.  DISCHARGE INSTRUCTIONS:  The patient is to avoid fatty foods, which may exacerbate her possible  gallbladder attacks.  She is to schedule a repeat limited chest CT for two weeks.  FOLLOW-UP:  She is to make appointment with Dr. Lindie Spruce if the gallbladder needs to be re-addressed for further exacerbation in the future.  She is to follow up with Drs. Ehinger and Brackbill as prior to admission.  HISTORY OF PRESENT ILLNESS:  [name] is a 70 year old woman with a complicated past medical history as delineated above, who presented to the emergency room after developing the sudden onset of right-sided chest/upper quadrant pain associated with shortness of breath.  This discomfort seemed to worsen with movement and with deep inspiration.  She felt that her heart was beating faster than normal.  There had been no syncope.  Dr. Elease Hashimoto was initially asked to evaluate the patient given her prior extensive cardiac history, but on his assessment it was clear her symptoms were not due to a cardiac cause.  As such, Dr. Selina Cooley evaluated Ms. Gayler and admitted her to the [facility].  For further details of history and presentation, please see Dr. Namon Cirri and Dr. Evonnie Pat reports.  HOSPITAL COURSE:  RIGHT UPPER QUADRANT/CHEST PAIN.  [name] had normal temperature, blood pressure 193/92, pulse 100, and respirations of 20 and no oxygen saturation recorded at time of presentation to the emergency department (correction - 94% on room air).  Her exam was unrevealing except for the apparent presence of pain in the deep right upper quadrant.  EKG revealed atrial fibrillation, and chest x-ray was unremarkable.  White count normal at  8.2, hemoglobin 13.9, BUN 29, creatinine 1.4, and all LFTs/hepatobiliary enzymes were normal.  Her urinalysis was unremarkable.  Diagnoses of gallbladder etiology and pulmonary embolus were entertained. Right upper quadrant ultrasound was performed, which was normal.  Dr. Jimmye Norman evaluated [name] and proceeded with hepatobiliary scan,  which was normal.  Of concern was [name] persistent pleuritic component with pain in the right mid-lower chest radiating to the right scapular region, associated with shortness of breath.  Oxygen saturation dropped to 92% on room air at its lowest, although she remained hemodynamically stable.  The possibility of PE was pursued with spiral CT scan, which revealed a wedge-shaped opacity in the posterior aspect of the right middle lung.  Such a finding could be consistent either with a pulmonary embolus infarction or an infectious etiology. Notably, no pulmonary embolus was noted corresponding to the lesion.  The night prior to discharge, [name] was feeling quite poorly with increased shortness of breath and increased pleuritic chest pain, at which time her oxygen saturation was 92% on room air.  Oxygen was administered, which promptly relieved her shortness of breath, and Toradol and Vioxx completely resolved her pain.  The etiology of her pain remains unclear, but it appears that her gallbladder was probably not responsible.  There is a possibility of pneumonitis or pneumonia, although clinically she has had no cough, fever, or leukocytosis, which does not support this.  Dr. Danice Goltz evaluated [name] and her diagnostic studies to help determine likelihood of pulmonary embolus.  Again, this remains unclear.  [name] is not deemed stable enough to undergo an angiogram, according to Dr. Donnie Aho, and regardless of the result, she is unwilling to entertain further anticoagulation given her significant hemorrhagic stroke one year ago.  This is understandable.  At this point, we will treat empirically with antibiotic coverage for one wee and repeat a limited CT of the chest to the area in question at two weeks.  Results will be sent to Dr. Jayme Cloud, Dr. Manus Gunning, and Dr. Lindie Spruce.  Vioxx will be continued for the pleuritic pain.  Oxygen saturation is 97% on room air before  and after ambulation without shortness of breath on day of discharge, and she requests discharge home and I find her stable to do so.  Because appropriate anticoagulation for a pulmonary embolus is not possible in this patient, our only option is to simply observe. Dictated by:   Viviana Simpler, M.D. Attending Physician:  Koren Bound DD:  08/28/01 TD:  08/28/01 Job: [id number]

## 2011-02-14 NOTE — Consult Note (Signed)
[place]. [facility]  Patient:    [name], [name] Visit Number: [id number] MRN: [id number]          Service Type: MED Location: [phone] Attending Physician:  Rudean Hitt Dictated by:   Chinita Pester, N.P. Proc. Date: 05/18/01 Adm. Date:  05/17/2001   CC:         [name] A. Patty Sermons, M.D.  Dyanne Carrel, M.D.   Consultation Report  REASON FOR CONSULTATION:  Cardiac arrest, rule out VT.  HISTORY OF PRESENT ILLNESS:  Thank you for this consultation of this 70 year old female with a past medical history of chronic atrial fibrillation, hypertension, who presented to Dr. Jenness Corner office for a routine follow-up exam.  She had been in her usual state of health.  After the exam she became suddenly unresponsive, cyanotic, had tonic-clonic movement of her upper extremities, pupils dilated, no pulses or respirations.  Chest percussions were performed, cyanosis resolved gradually, and she gradually awakened but was lethargic.  EKG afterwards showed atrial fibrillation with short runs of nonsustained VT.  Potassium on admission was 3.2.  The patient states she had no warning.  Remembers her exam and waking up to a roomful of people.  Had been having episodes of dizziness for the past few months, attributed to a low heart rate, and Toprol had been discontinued, but the dizziness persisted.  PAST MEDICAL HISTORY: 1. Hypertension. 2. Atrial fibrillation. 3. Intracranial hemorrhage with an INR of 4.0. 4. DVT. 5. CHF. 6. Aortic insufficiency.  PAST SURGICAL HISTORY:  Vena cava filter.  SOCIAL HISTORY:  Unemployed.  Divorced.  Mother of four children.  Denies alcohol, tobacco, or recreational drugs.  FAMILY HISTORY:  Positive for CAD, CA, CVAs.  ALLERGIES:  NEOSPORIN, POLYSPORIN, and ADHESIVE TAPE which caused a rash.  MEDICATIONS: 1. Aspirin 325 daily. 2. K-Dur 20 t.i.d.  REVIEW OF SYSTEMS:  HEENT:  Positive for glasses.   CARDIOVASCULAR:  No chest pain or ______.  RESPIRATORY:  No shortness of breath.  Occasional dyspnea on exertion.  GASTROINTESTINAL:  No nausea, vomiting, diarrhea.  GENITOURINARY: Negative dysuria.  LABORATORY DATA:  Potassium on admission was 3.2.  On May 18, 2001, sodium was 145, potassium 2.9, chloride 109, CO2 30, glucose 92, BUN 21, creatinine 1.1.  WBC 9.9, H&H 14 and 40.5, platelets 246,000.  INR was 0.9.  Cardiac enzymes:  First set was CK 36, MB 0.3, troponin 0.03.  Second set was 29, 1.0, 0.21.  Third set 26, 1.0, and 0.16.  EKG in the office showed atrial fibrillation with frequent PVCs and couplets. EKG in the emergency room showed atrial fibrillation with anterolateral Qs, ST depression inferolaterally.  October 17, 1999, Cardiolite had no ischemia.  July 23, 2000, 2-D echo showed an EF of 55-60% with 2+ AI.  CT scan of the head done this admission showed hyperdensity, left basal ganglia, calcification secondary to prior hematoma versus small amount of rehemorrhage, and atrophy with small-vessel disease.  Chest x-ray: Cardiomegaly, no apparent disease.  PHYSICAL EXAMINATION:  VITAL SIGNS:  97.6, 52, 20, 140/70.  Telemetry shows atrial fibrillation.  GENERAL:  Well-developed 70 year old female lying in bed, in no apparent distress.  HEENT:  Sclerae clear.  NECK:  Supple.  Nontender.  No bruit.  CARDIOVASCULAR:  Irregular.  No murmur.  LUNGS:  Clear to auscultation bilaterally.  ABDOMEN:  Soft, round, nontender.  Normoactive bowel sounds.  EXTREMITIES:  No edema.  NEUROLOGIC:  Awake, alert, and oriented x 3.  ASSESSMENT AND PLAN:  Per Dr. Lewayne Bunting: 1. Questionable border cardiac arrest.  The mechanism is either a seizure,    prolonged bradycardia, or malignant ventricular arrhythmia.  Note the lack    of bowel and bladder incontinence and cyanosis, both of which may go    against a primary seizure problem.  However, with prior intracranial     hemorrhage, she has a potential seizure focus substrate. 2. Hypertension. 3. Atrial fibrillation.  RECOMMENDATIONS:  I think we need to definitely exclude obstructive CAD with coronary angiography.  Will discuss the episode with Dr. Patty Sermons, but I am concerned enough about the history and findings on telemetry and underlying substrate, i.e., underlying atrial fibrillation, hypertension, and heart disease, that empiric ICD implant may be warranted. Dictated by:   Chinita Pester, N.P. Attending Physician:  Rudean Hitt DD:  05/18/01 TD:  05/18/01 Job: [id number]

## 2011-02-14 NOTE — H&P (Signed)
[place]. [facility]  Patient:    [name], [name] Visit Number: [id number] MRN: [id number]          Service Type: EMS Location: [facility] Attending Physician:  Carmelina Peal Dictated by:   Alvia Grove., M.D. Admit Date:  08/25/2001   CC:         Viviana Simpler, M.D.  Dyanne Carrel, M.D.  Thomas A. Patty Sermons, M.D.  Nathen May, M.D., [facility]   History and Physical  HISTORY OF PRESENT ILLNESS:  [name] is a 70 year old female with a history of atrial fibrillation, DVT, intracerebral bleed (while on Coumadin), and history of VT/cardiac arrest status post AICD placement.  She is admitted for right upper quadrant pain and rib tenderness.  The patient has a very complex medical and cardiac history.  She has a long history of hypertension since 1991.  She was lost to follow-up until October of 2001 when she was found to have cardiomegaly and intermittent atrial fibrillation.  She was started on Coumadin.  On New Years Eve of 2001, she had some weakness and felt quite hot and was brought to the emergency room. She was ultimately found to have an intracerebral bleed.  It was sometime during that admission that she was also noted to have a deep venous thrombosis.  She had had a significant intracerebral hemorrhage while on Coumadin and a Greenfield filter was placed.  She then presented to the office in August of 2002.  She subsequently had a cardiac arrest.  A subsequent heart catheterization revealed normal coronary arteries.  She was elevated by the electrophysiologist and an AICD was placed. She has done well since that time.  Tonight after eating dinner, she developed a sudden onset of right rib pain. The pain was right under her right breast.  The pain seems to worsen with movement and a deep breath.  She was noted to have somewhat of an increased heart rate.  She denied any real anginal-like pain.  She was not  really short of breath, but she had difficulty in taking a deep breath because of the rib pain.  She denies any fainting or syncopal episodes or diaphoresis.  CURRENT MEDICATIONS: 1. Aspirin once a day. 2. Lopressor 25 mg twice a day. 3. Lasix 20 mg a day. 4. Centrum Silver once a day.  ALLERGIES:  She has no known drug allergies.  PAST MEDICAL HISTORY: 1. Intermittent atrial fibrillation. 2. History of intracerebral hemorrhage while on Coumadin. 3. History of DVT, now status post a Greenfield filter. 4. History of ventricular tachycardia/cardiac arrest in August of 2002.  She    is now status post AICD. 5. Normal coronary arteries by heart catheterization.  SOCIAL HISTORY:  The patient is a nonsmoker.  FAMILY HISTORY:  As per previous notes.  REVIEW OF SYSTEMS:  She deniess any chest pain or dyspnea.  She denie any heat or cold intolerance, weight gain, or weight loss.  She denies any recent problems with her eyes, ears, nose, and throat.  She denies any change in bowel habits or change in her urinary habits.  She denies any frequency or dysuria.  She denies any recent episodes of indigestion.  She has not been eating any spicy foods.  The review of systems is otherwise negative.  PHYSICAL EXAMINATION:  She is a middle-aged female in no acute distress.  She is alert and oriented x 3.  Her mood and affect are normal.  VITAL SIGNS:  The initial blood pressure was 193/92 with a heart rate of 101. Her temperature is 96.9 degrees.  NECK:  2+ carotids.  She has no bruits.  There is no JVD.  She has no thyromegaly.  LUNGS:  Clear to auscultation.  HEART:  Regular rate.  S1 and S2.  ABDOMEN:  Good bowel sounds.  She is tender under her right rib cage.  There is no rebound at all.  She is not tender, but her right costal margin is somewhat tender to palpation.  EXTREMITIES:  She has no clubbing or cyanosis.  There is no calf tenderness. There are no palpable  cords.  NEUROLOGIC:  The right side is slightly weak.  She is able to move all four extremities.  Her cranial nerves are intact.  LABORATORY DATA:  Her laboratory data and chest x-ray are pending.  Her EKG is pending.  [name] presents with an episode of right-sided epigastric/lower chest pain following eating.  She has been told that she has had some gallbladder problems in the past, although I cannot find any particular studies to support this.  The pain does not appear to be consistent with cardiac disease.  We will admit her to the hospital for further evaluation.  She will need to get a gallbladder ultrasound in the morning.  We will check her labs, chest x-ray, and EKG.  We will notify the [facility] and have them follow along with [place].  There is a possibility that this pleuritic-like chest pain represents a very small pulmonary embolus.  She as a Greenfield filter in place and so I do not think that she would have a large pulmonary embolus.  A spiral CT may not pick up a small pulmonary embolus that could make its way through a Greenfield filter.  I do not think that we will pursue any further diagnostic studies tonight.  Furthermore, we do not have any anticoagulation options available. We will continue to follow her clinically. Dictated by:   Alvia Grove., M.D. Attending Physician:  Carmelina Peal DD:  08/25/01 TD:  08/25/01 Job: [id number]

## 2011-02-14 NOTE — Assessment & Plan Note (Signed)
[facility]                         ELECTROPHYSIOLOGY OFFICE NOTE   ZONIA, CAPLIN                     MRN:          [id number]  DATE:12/14/2006                            DOB:          12-Jan-1941    Ms. [name] was seen today in the clinic on December 14, 2006, for follow-up  of her Medtronic model number 7231 Gem III VR.  Date of implant was  May 20, 2001, for cardiac arrest.  On interrogation of her device  today, her battery voltage is 2.58 with a charge time of 10.11 seconds.  R-waves measured 10 to 11 mV with a ventricular pacing threshold of 2  volts at 2 msec and a ventricular lead impedance of 661.  Shock  impedance was 18.  There were 18 nonsustained episodes, no episodes with  therapy.  No changes were made in her parameters today.  She will return  again in four months' time.      Altha Harm, LPN  Electronically Signed      Duke Salvia, MD, [facility]  Electronically Signed   PO/MedQ  DD: 12/14/2006  DT: 12/14/2006  Job #: [id number]

## 2011-02-14 NOTE — Discharge Summary (Signed)
NAMEJOANNIE, MEDINE              ACCOUNT NO.:  [id number]   MEDICAL RECORD NO.:  [id number]          PATIENT TYPE:  INP   LOCATION:  4733                         FACILITY:  [facility]   PHYSICIAN:  [name], M.D. DATE OF BIRTH:  Dec 20, 1940   DATE OF ADMISSION:  08/22/2005  DATE OF DISCHARGE:  08/26/2005                                 DISCHARGE SUMMARY   FINAL DIAGNOSES:  1.  Exacerbation of congestive heart failure.  2.  Chronic atrial fibrillation.  3.  Past history of ventricular fibrillation arrest and is status post      implantable cardioverter defibrillator placement.  4.  Exogenous obesity.  5.  Hypercholesterolemia.  6.  Status post hemorrhagic stroke.  7.  Past history of deep vein thrombosis with Greenfield filter in place.  8.  Right ankle pain secondary to the previous fracture of right ankle being      followed by Dr. Gean Birchwood.   HISTORY:  This 70 year old Caucasian female was admitted as an emergency on  the day after Thanksgiving, August 22, 2005, by Dr. Reyes Ivan. She was  admitted because of worsening dyspnea which had worsened over the previous  two days. She was noting orthopnea, paroxysmal nocturnal dyspnea, exertional  dyspnea and malaise. She had had an increased weight gain and had had a dry  cough. She has had increased lower extremity edema over the past several  days. She has not had any shock from the defibrillator. She has felt a  fullness in her chest but that improved when she was placed on oxygen in the  emergency room.   MEDICATIONS ON ADMISSION:  1.  Diovan 320 daily.  2.  Diltiazem CD 180 mg daily.  3.  Metoprolol 50 mg b.i.d.  4.  Lipitor 40 mg daily.  5.  Aspirin 325 mg daily.  6.  Lasix 40 mg daily.   PHYSICAL EXAM ON ADMISSION:  A blood pressure 160/60, pulse was 90 and  irregular, O2 sats were 92% on 2 liters.  She was in no acute distress. She  had mild jugular venous distension. She had bibasilar crackles. She had a  grade  3/6 holosystolic murmur. Abdomen is soft and nontender. Extremities  show 1+ edema.   Chest x-ray showed mild volume overload.  Cardiac enzymes were negative. B  natriuretic peptide was 700.  BUN 22, creatinine 1.2, potassium 3.7,  hemoglobin 13.7.   EKG showed atrial fib with rapid ventricular response at 106 with LVH, old  anterolateral MI and ST depression in inferior lateral leads.   HOSPITAL COURSE:  She was admitted to a hospital bed.  She was treated with  IV Lasix and her Diovan was continued for afterload reduction. She was also  given nitroglycerin for preload reduction as needed and her diltiazem and  metoprolol were continued for rate control. She responded nicely to  diuresis. Her weight on admission was 215 and on discharge was 205. Her  chest x-ray improved. She remained dyspneic and she did have a VQ lung scan  which was normal. By August 25, 2005, her oxygen saturation was up to 90  weeks and on 2 liters after diuresis.  Her B natriuretic peptide had dropped  to 278 and renal function remained reasonably stable. Her Foley was  discontinued on August 25, 2005, and she had no difficulty voiding. She  underwent two-dimensional echocardiogram on August 25, 2005, which showed  an ejection fraction of 40-50%, showed a thickness of the aortic valve with  a slightly dilated aortic root and moderate aortic valve regurgitation.  There was no significant worsening of her LV function or her aortic  insufficiency since the previous echoes. By August 26, 2005, it was felt  that she was stable enough to be discharged home. Rhythm was stable atrial  fibrillation at time of discharge. On the morning of discharge her BMET  showed a sodium of 140, potassium 4.2, BUN 33, creatinine 1.4, blood sugar  99.   The patient is being discharged, will be followed up in one week in the  office. She will be seen for an office visit, EKG, BMET and uric acid.   DISCHARGE MEDICATIONS:  1.   Diovan 320 mg daily.  2.  Toprol XL 50 mg daily.  3.  Ecotrin 325 mg daily.  4.  K-Dur 20 mEq three times a day.  5.  Lasix 40 mg twice a day.  6.  Lipitor 40 mg daily.  7.  Norvasc 2.5 mg daily.  8.  Diltiazem XR 180 mg daily.  9.  Darvocet N 100 if needed for pain.   She will be on a low-salt, low-cholesterol diet. She is to weigh herself and  report any unusual rapid weight gain.   CONDITION ON DISCHARGE:  Improved.           ______________________________  [name], M.D.     TB/MEDQ  D:  08/26/2005  T:  08/26/2005  Job:  [id number]   cc:   Bryan Lemma. Manus Gunning, M.D.  Fax: [phone]   Duke Salvia, M.D.  [address]  [address]  [place]  [place] [address]   Feliberto Gottron. Turner Daniels, M.D.  Fax: [phone]

## 2011-02-14 NOTE — Consult Note (Signed)
[place]. [facility]  Patient:    [name], [name]                     MRN: [id number] Proc. Date: 09/30/00 Adm. Date:  [id number] Attending:  Fenton Malling CC:         Kelli Hope, M.D.  Dr. Meliton Rattan, [facility]   Consultation Report  HISTORY OF PRESENT ILLNESS:  This is a 70 year old Caucasian female known to me, who was admitted on September 28, 2000, by Dr. Thad Ranger after suffering an intracerebral hemorrhage.  This patient has a long history of essential hypertension, first seen in our office in 1991 and followed intermittently until 1993, and then she was lost to follow-up for the next seven years.  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  She was seen again in January 2001 after she had an abnormal electrocardiogram as part of a preoperative workup for cataract surgery.  She was found to have an abnormal EKG with poor R-wave progression V1-V3 and was referred back to our office by Dr. Meliton Rattan for further evaluation.  She underwent a two-day adenosine Cardiolite stress test on October 17, 1999, which showed no ischemia and normal LV function at 51%.  She weighed 233 pounds at that time.  In October 2001, she again was seen in the office after a three-week history of increased dyspnea and cough and chest x-ray at [facility] showing cardiomegaly.  We evaluated her and found that she was in new atrial fibrillation with a rapid ventricular response, and it was felt that her dyspnea and cough were secondary to heart failure secondary to rapid atrial fibrillation.  She was started on Coumadin and was also placed on Lanoxin for rate control.  She returned to the office on  July 23, 2000, for a 2D echo, which at that time showed an ejection fraction of 55-60% and normal left atrial size at 3.4, and she had a dilated aortic root at 4.2 with aortic valve sclerosis and 2+ aortic insufficiency.  Coumadin was started, and she was followed as an outpatient.  The possibility of elective cardioversion was discussed with the patient on August 28, 2000, but patient elected to forego antiarrhythmic therapy and possible electroshock therapy and rather prefers to stay in atrial fibrillation with a controlled ventricular response, since she was essentially asymptomatic if her rate was controlled.  She returned to our office on New Years Eve for an outpatient protime.  Because of her insurance requirements, the protime had to be sent out to an outside lab, and the results were not available until after New Years Day.  Results came back elevated at 28.2 seconds, an INR of 4.0.  That evening, on September 28, 2000, the patient had a New Years Eve dinner with her family and at the conclusion of the dinner complained of feeling hot.  She went home and was noted by her family to be having right-sided weakness and increasing confusion and difficulty with speech, and family insisted that she be checked by a physician.  The patient was initially taken to [facility], where she was evaluated and then transferred to [facility] for further evaluation by neurology service.  Workup revealed an intracerebral hemorrhage.  MEDICATIONS:  The patients medications at the time of admission in addition to Coumadin were Micardis HCT 80/12.5 mg once a day, Lanoxin 0.25 mg daily, and Lasix 20 mg p.r.n. for fluid retention.  FAMILY HISTORY:  Positive for coronary artery disease.  SOCIAL HISTORY:  She did not use alcohol or tobacco.  She is a retired [employer].  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  REVIEW OF SYSTEMS:   Unremarkable except as noted in the present illness.  PHYSICAL EXAMINATION:  VITAL SIGNS:  On physical examination tonight, her blood pressure is 155/65, pulse is 56 and irregular in atrial fibrillation, respirations are normal.  HEENT:  Unremarkable.  NECK:  Jugular venous pressure not increased.  The carotids reveal no bruits. The thyroid is not enlarged or tender.  CHEST:  Clear to percussion and auscultation.  CARDIAC:  The heart reveals normal first sound.  Second sound is slightly accentuated.  There is a grade 2/6 aortic insufficiency murmur at the left sternal edge at the aortic area.  There is no S3 gallop.  There is no pericardial rub.  ABDOMEN:  Soft and nontender without bruits or masses or organomegaly.  EXTREMITIES:  No edema, good pedal pulses.  NEUROLOGIC:  She has right-sided weakness and expressive aphasia but does comprehend what she is hearing and also makes good effort to speak.  Her speech is somewhat slurred, however, and difficult to understand at this point.  LABORATORY DATA:  Her electrocardiogram shows atrial fibrillation with a slow ventricular response.  There is a left ventricular strain pattern.  There is poor R-wave progression V1-V3, unchanged from prior office tracing.  DIAGNOSTIC IMPRESSION: 1. Intracerebral hemorrhage. 2. Coumadin-induced coagulopathy, now reversed. 3. Atrial fibrillation with controlled ventricular response. 4. Longstanding essential hypertension with previously-demonstrated absence of    renal artery hypertension. 5. Exogenous obesity.  DISPOSITION:  Agree with holding Coumadin.  Will control systolic blood pressure with IV labetalol until she is able to resume oral medications.  Will control heart rate and rhythm with Lanoxin, and will obtain a 2D echo to reassess her left atrial size and degree of aortic root dilatation as well as LV function. DD:  09/30/00 TD:  10/01/00 Job: [id number]

## 2011-02-14 NOTE — H&P (Signed)
[name], [name] NO.:  [id number]   MEDICAL RECORD NO.:  [id number]          PATIENT TYPE:  EMS   LOCATION:  MAJO                         FACILITY:  [facility]   PHYSICIAN:  Elmore Guise., M.D.DATE OF BIRTH:  12-17-1940   DATE OF ADMISSION:  08/22/2005  DATE OF DISCHARGE:                                HISTORY & PHYSICAL   PRIMARY CARDIOLOGIST:  Dr. Ronny Flurry, [facility].   HISTORY OF PRESENT ILLNESS:  The patient is a very pleasant 70 year old  white female with a past medical history of atrial fibrillation, hemorrhagic  stroke, ventricular fibrillation arrest, (status post ICD implant 2000),  deep venous thrombosis (with Greenfield filter in place), who presents with  a two day history of increased shortness of breath, malaise, orthopnea, and  paroxysmal nocturnal dyspnea.  The patient reports mild weight gain over the  same time, also off and on cough, worse in the supine position. She has also  noted subjective fever, off and on sore throat and multiple arthritic  complaints with her knees and feet. She has had increased lower extremity  edema over the last couple of days. She denies any discharge from her  defibrillator. She reports she was taking her medications as instructed. She  felt a fullness in her chest.  However, that resolved once the patient was  given oxygen.   CURRENT MEDICATIONS:  1.  Diovan 320 mg daily.  2.  Diltiazem 180 mg daily,.  3.  Metoprolol 50 mg b.i.d.  4.  Lipitor 40 mg daily.  5.  Aspirin 325 mg daily.  6.  Lasix 40 mg daily.   ALLERGIES:  NEOSPORIN.   FAMILY HISTORY:  Positive for coronary disease, hypertension and cancer.   SOCIAL HISTORY:  She denies any tobacco or alcohol.  She is fairly  ambulatory without assistance at home.   PHYSICAL EXAMINATION:  VITAL SIGNS:  She is afebrile. Blood pressure 160/60,  heart rates in the 90s, sating 92% on 2 liters.  GENERAL:  She is a very pleasant elderly  white female, alert and oriented x4  in no acute distress.  NECK:  She has mild JVD at approximately 15 cm H20 sitting at 30 degrees.  LUNGS:  She has bibasilar crackles.  HEART:  Irregularly irregular with a 3/6 holosystolic murmur.  ABDOMEN:  Soft, nontender, nondistended.  EXTREMITIES:  Warm with 1+ edema.   LABORATORY DATA:  Her lab work shows a hemoglobin of 13.7, white count of  12.8, platelet of 290,000. Coag's are normal at 13.0/1.0 and 26.  BUN and  creatinine are 22 and 1.2, potassium 3.7, alk phos is 123, otherwise liver  function tests are normal. BMP is 700. Cardiac enzymes are negative x2.  Chest x-ray shows mild volume overload. ECG shows atrial fibrillation with  rapid ventricular response, heart rate is 106 per minute, LVH with  repolarization abnormalities, possible old anterolateral myocardial  infarction and ST depression noted in her inferior and lateral precordial  leads.   IMPRESSION:  1.  Congestive heart failure exacerbation.  2.  Chronic atrial fibrillation.  3.  History of  ventricular fibrillation arrest, (status post ICD placement).   PLAN:  1.  For congestive heart failure exacerbation, would admit her to a      telemetry bed. Will start Lasix 40 mg IV q.8h.  Will continue her      afterload reduction with Diovan.  Will also add preload reduction with      nitroglycerin.  I will continue her Diltiazem and metoprolol as before.      I will recheck a BMP in the morning.  I do feel that she would probably      be able to be discharged after some aggressive diuresis in approximately      24-48 hours.      Elmore Guise., M.D.  Electronically Signed     TWK/MEDQ  D:  08/22/2005  T:  08/22/2005  Job:  [id number]   cc:   [name], M.D.  Fax: [phone]

## 2011-02-14 NOTE — Letter (Signed)
August 11, 2006    [name], M.D.  [address]  [place], [place] [address]   RE:  [name], [name]  MRN:  [id number]  /  DOB:  October 29, 1940   Dear Elijah Birk,   [name] comes in today.  She has no cardiac complaints.  She just feels  tired and crummy.  She does not have daytime somnolence.  She has a moderate  amount of aches that come and go.   Her medications are notable for both Cartia and Norvasc.  Metoprolol is  taken once a day at 50 mg a day.  She is on aspirin, Diovan 320, Lasix 40,  and potassium.   PHYSICAL EXAMINATION:  VITAL SIGNS:  Her blood pressure is 140/78, which is  the highest that we have seen.  Pulse of 64.  LUNGS:  Clear.  HEART:  Heart sounds were regular.  EXTREMITIES:  Without edema.   Interrogation of her Medtronic GEM III ICD demonstrates an R wave of 8 with  an impedence of 611, a threshold at 2 volts at 0.2.  High voltage impedence  was 17 ohm.  Battery voltage was 2.58.  There are no intercurrent episodes.   IMPRESSION:  1. Reported sudden cardiac death.  2. Status post implantable cardioverter/defibrillator from the above.  3. Hypertension.  4. Normal left ventricular function.  5. Fatigue.   Elijah Birk, [name] is doing quite well from an arrhythmia point of view.  I  wonder whether some of her medications might not be contributing to her  symptoms.  I have taken the liberty of asking her to try to wean herself off  of her beta blocker and to let us know whether she is any better off of  that.  In addition, I took the liberty of increasing her Norvasc and  discontinuing her Tiazac, in the hopes that this might also, by reducing her  medicines, help her to feel better without affecting her blood pressure  adversely.  She is supposed to investigate her blood pressure, follow it up  with you guys, and we will see her in the device clinic in about four  months.   Thanks again for allowing [place] to participate in her care.    Sincerely,      Duke Salvia, MD, [facility]  Electronically Signed    SCK/MedQ  DD: 08/11/2006  DT: 08/11/2006  Job #: [id number]   CC:    Bryan Lemma. Manus Gunning, M.D.

## 2011-02-14 NOTE — Consult Note (Signed)
[place]. [facility]  Patient:    [name], [name] Visit Number: [id number] MRN: [id number]          Service Type: MED Location: [id number] Attending Physician:  [name] Proc. Date: 05/17/01 Adm. Date:  05/17/2001   CC:         [name], M.D.  [name], M.D.   Consultation Report  DATE OF BIRTH:  [birth year]  CHIEF COMPLAINT:  Possible seizure.  HISTORY OF PRESENT ILLNESS:  I was asked by Heather Pu. [name], M.D. to see [name], a 70 year old, divorced, left-handed woman who was being see by Dr. Patty [name] on a routine visit when she had a sudden episode of unresponsiveness.  Her daughter describes that the patient initially started to cough as if she were choking.  She stared with her eyes fixed straight ahead and became red. She became somewhat stiff and then she collapsed.  Dr. Patty [name] came in to see her and found that she was without pulse and not breathing.  She had mild acrocyanosis.  He stepped out of the room to call for emergency backup and when he came back in, pushed lightly on her chest, and she began to breathe and regained her color and heart rate.  The entire episode lasted for one to two minutes in duration.  The patient had some postictal confusion.  During that time, however, she had no convulsive activity.  She did not lose control of her bowels or bladder nor did she bite her tongue.  The patient has a chronic history of atrial fibrillation, not on Coumadin. She had a prior intercerebral hemorrhage on New Years Eve 2001.  She was admitted to the hospital by my partner, Dr. Jacki [name], after a sizeable left subcortical hemorrhagic stroke was noted extending from the head of the caudate laterally into the putamen and the subcortical left centrum semiovale.  The patient was on the acute neuro service for about two weeks and then changed over to rehab.  She did extremely  well and though she has mild problems with her gait, she uses her right side very well.  Interestingly, the patient had problems with _____ and still has some problems recalling some names.  I suspect she is a left brain dominant, left-handed person, as a result of observations related to her stroke.  PAST MEDICAL HISTORY: 1. Congestive heart failure treated with Lasix. 2. The patient has had hypokalemia, was on potassium, but stopped taking it    due to nausea. 3. She has a history of mild hypertension. 4. The patient had a venous thrombosis in February 2002 requiring replacement    of a Greenfield filter because of concerns over her intracerebral    hemorrhage. 5. The patient has occasional constipation. 6. Myopia and presbyopia and wears glasses. 7. She is postmenopausal. 8. She has had no problems with pain.  She has had subtle weight loss over    time.  MEDICATIONS: 1. Lasix 20 mg q.d. 2. Potassium 10 mEq q.d. 3. Vicodin 2 mg q.4h. as needed.  ALLERGIES:  No known drug allergies.  Both NEOSPORIN and POLYSPORIN caused a rash.  The patient also had rash to ADHESIVE TAPE.  SOCIAL HISTORY:  The patient does not use tobacco or alcohol.  She is divorced and lives with family in a home (her daughter).  I believe she has not returned to work since her stroke, despite the fact that she has made very good  recovery.  The patient has four children.  PAST SURGICAL HISTORY:  Only involves Greenfield filter placement.  REVIEW OF SYSTEMS:  The patient has evidence of aortic insufficiency.  PHYSICAL EXAMINATION:  VITAL SIGNS:  Blood pressure 104/60, resting pulse 60, respirations 20, and weight 166 pounds.  Height 5 feet 2 inches.  GENERAL:  This is a very pleasant woman, 70 years old, in no acute distress.  HEENT:  No signs of infection.  NECK:  Supple, no bruits.  LUNGS:  Clear.  HEART:  No murmurs.  Pulses normal (I did not hear an aortic insufficiency murmur.)  ABDOMEN:   Soft, bowel sounds, no hepatosplenomegaly.  EXTREMITIES:  No edema, cyanosis.  The patient has some varicosities in her legs.  MENTAL STATUS:  No dysphagia or dyspraxia.  Cranial nerve examination revealed round reactive pupils, visual fields full to double simultaneous stimuli.  OKN responses equal.  She had symmetric facial strength.  She was able to protrude her tongue and elevate her uvula midline.  Air conduction greater than bone conduction bilaterally.  MOTOR EXAMINATION:  Normal strength, tone, and mass.  Good fine motor movements.  No pronator drift.  The only weakness that I saw was in her right hip flexor (4/5, right psoas, knee flexor and foot dorsiflexor all 4+/5.  She does not wiggle her toes on the right as well as left.)  Fine motor movements are roughly equal, right and left in the upper extremities.  SENSATION:  Peripheral stocking neuropathy bilaterally.  The patient had good stereoagnosis.  CEREBELLAR EXAMINATION:  Finger-to-nose was fairly equal.  Rapid repetitive movements were a little slower on right hand than left.  Gait was surprisingly good given her right-sided weakness.  She was able to get up on her toes and heels and perform a tandem.  She had a negative Romberg.  There was no reflex for dominance.  She had bilateral flexor plantar responses.  IMPRESSION: 1. Syncopal seizure, 780.2, with cardiac arrest.  I believe this is more    likely due to cardiac arrhythmia as opposed an anniversary seizure.  The    patient did not have any convulsive activity and while that is not    absolutely necessary, the behavior that was described by her daughter    sounds much more related to an ischemic event causing syncope and    stiffening rather than an epileptic event. 2. The patient had a left subcortical hemorrhage, September 28, 2000,    involving caudate and putamen.  I have reviewed the CT scan carried out     today which shows significant shrinkage of the  lesion.  There is a small    sliver of low density cyst extending from the head of the caudate into the    putamen at the dorsal and inferior most portion.  There is an area of    increased signal that may represent blood, but more likely represents    calcification.  There is also a second small lesion superior to the caudate    in the left frontal centrum semiovale.  No other lesions were seen.  No    acute lesions were seen.  PLAN: 1. An EEG was carried out and I will review it. 2. I would not place her on any epileptic drugs for now. 3. The patient needs to be on telemetry. 4. I would consider consultation with Nathen May, M.D. to look for    cardiac conduction defect.  If you have questions about  this or I could be of assistance, do not hesitate to contact me. Attending Physician:  [name] DD:  05/17/01 TD:  05/18/01 Job: [phone]

## 2011-02-14 NOTE — H&P (Signed)
[place]. [facility]  Patient:    [name], [name] Visit Number: [id number] MRN: [id number]          Service Type: MED Location: [phone] [id number] Attending Physician:  Osvaldo Human Adm. Date:  [id number]   CC:         Dyanne Carrel, M.D.  Deanna Artis. Sharene Skeans, M.D.   History and Physical  HISTORY OF PRESENT ILLNESS:  This is a 70 year old, Caucasian female who was admitted after having a seizure with apparent cardiac arrest in my office earlier today.  This patient has a past history of long-standing hypertension. We first saw her in our office in 1991, and then intermittently until 1993. She was then lost to follow up for the next seven years, but in the early 1990s, was noted to have severe hypertension which was difficult to control. At one point, she had a renal arteriogram at [facility] which showed no evidence of renal artery stenosis, but did show two right-sided smooth renal arteries and one left-sided artery.  She was seen again in January 2001, after she had an abnormal electrocardiogram as part of a preoperative workup for cataract surgery.  She was noted to have poor R wave progression in V1-V3. She underwent a two-day adenosine Cardiolite stress test on October 17, 1999, which showed no ischemia and no LV function.  She weighed 233 pounds at that time.  In October 2001, she was again seen in our office with a three-week history of increased dyspnea and cough and a chest x-ray showed cardiomegaly. She was found to be in new atrial fibrillation with a rapid ventricular response.  It was felt that her dyspnea and cough at that time were secondary to her congestive heart failure secondary to rapid atrial fibrillation.  She was started on Coumadin for her atrial fibrillation and also placed on Lanoxin for control of her ventricular rate.  She had a two-dimensional echocardiogram on July 23, 2000, in our office which showed ejection  fraction of 55-60% with normal left atrial size of 3.4, aortic root at 4.2 with aortic valve sclerosis and 2+ aortic insufficiency.  The patient returned to our office on New Years Eve for an outpatient protime and later that evening had an intracerebral hemorrhage.  Protime at the time of her hemorrhage was 28.2 with an INR of 4.0.  This was manifested as right-sided weakness and increasing confusion with speech difficulty.  The patient was taken initially to [facility] where she was evaluated and then transferred to [facility] for further evaluation by neurology.  She was found to have an intracerebral hemorrhage.  The patient had a slow hospital course, but showed gradual improvement.  Because of her intracerebral hemorrhage, Coumadin had to be stopped and she is no longer on Coumadin, although she remains in atrial fibrillation.  In February 2002, she was readmitted on the hospital service with leg swelling and was found to have DVT with inferior vena cava filter placed.  The patient has had dizziness intermittently for several months.  We saw her as an urgent workup on March 09, 2001, because of difficulty with shortness of breath over the past 48 hours.  She was found to have CHF by x-ray at that time and was begun on Toprol, Lanoxin and Lasix.  She returned on March 15, 2001, and reported a good symptomatic response to her heart failure treatment with the Lasix.  Dyspnea had improved, ankle edema had resolved and she was  having no side effects from the medication.  At that point, she was on Toprol, Lanoxin, Ecotrin and Lasix.  The patient subsequently developed nausea, vomiting and poor appetite and her Lanoxin was stopped on March 22, 2001.  On March 29, 2001, the patient called stating that she was dizzy, pulse was slow at about 40.  She was weak and could not walk without help and was advised to stop Toprol and also Lanoxin if she had not already stopped the Lanoxin.  She  called back on July 2, stating that she was feeling better, pulse was up to 52 and she was advised to continue off Toprol and take just Lasix and potassium.  She came into the office the following day on March 31, 2001, complaining of worsening dizziness and a sensation of spinning.  She denied headache.  Exam at that time showed a blood pressure of 180/80, pulse irregular at 50 per minute and she was placed on Hyzaar 50/12.5 one daily for hypertension and for heart failure.  She was also given meclizine for what sounded like positional vertigo.  The patient returned to the office today, May 17, 2001, for a routine followup visit.  She was having no cardiac complaints.  She was complaining of left groin and hip pain and mentioned that she had received some Vicodin from Dr. Riley Kill and was requesting a refill for that.  Towards the end of the exam, during which time her blood pressure had been measured at 170/80, the patient laid down on the hospital bed in the office examining room and proceeded to become unresponsive and have evidence of seizure activity with tonic-clonic movements of her upper extremities and her face deviated towards the right.  Pupils became extremely dilated bilaterally and no pulse or respirations could be detected.  The patient became cyanotic.  She was given a few chest percussions at which point her pupils began to narrow again and cyanosis resolved.  After a few minutes, the patient was awake, although lethargic, and was able to answer questions. She denied any chest pain.  Electrocardiogram taken immediately after the episode showed atrial fibrillation with runs of ventricular arrhythmia.  The patient was taken by ambulance to [facility] for further evaluation.  At the time of her episode, it was not clear as to whether this was primarily a neurologic problem with seizure or whether it was primarily a cardiogenic seizure secondary to an  arrhythmia.  MEDICATIONS: 1. Ecotrin 325 mg once a day. 2. Lasix 40 mg a day.  3. K-Tab 10 once a day, but has not been taking it because of nausea. 4. Generic Vicodin from Dr. Riley Kill.  PAST MEDICAL HISTORY: 1. Hypertension. 2. Intracerebral hemorrhage. 3. Placement of inferior vena cava filter. 4. Aortic insufficiency.  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 She is unemployed at present.  She is divorced.  She has four children.  She does not drink alcohol and she has never smoked.  ALLERGIES:  NEOSPORIN, POLYSPORIN and ADHESIVE TAPE.  REVIEW OF SYSTEMS:  She has not been having any change in bowel habits, no diarrhea or constipation.  She denies any dysuria.  She has had poor balance, but is ambulating without a cane.  PHYSICAL EXAMINATION:  VITAL SIGNS:  Blood pressure 170/80, pulse 60 in atrial fibrillation.  HEENT:  Pupils are equal, round and reactive to light, extraocular movements full.  NECK:  Jugular venous pressure normal, no bruits.  Thyroid not enlarged, nontender,  no lymphadenopathy.  LUNGS:  Clear to auscultation and percussion.  HEART:  Grade 3/6 decrescendo murmur of aortic insufficiency.  BREASTS:  No masses.  ABDOMEN:  Soft, nontender.  EXTREMITIES:  Good peripheral pulses with no edema.  IMPRESSION: 1. Status post in office cardiac arrest, questionably secondary to ventricular    arrhythmia. 2. 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 3. Aortic insufficiency. 4. Chronic atrial fibrillation. 5. Inferior vena cava filter in place.  PLAN: 1. Admit to monitored bed. 2. Check electrolytes stat. 3. We will observe for malignant arrhythmia. 4. Consider EP consult. 5. Will replete her potassium. 6. Get EEG and brain scan. 7. Request neurology consult. DD:  05/17/01 TD:  05/17/01 Job: [id number]

## 2011-02-14 NOTE — Cardiovascular Report (Signed)
[place]. [facility]  Patient:    [name], [name] Visit Number: [id number] MRN: [id number]          Service Type: MED Location: [phone] Attending Physician:  Rudean Hitt Dictated by:   Colleen Can Deborah Chalk, M.D. Proc. Date: 05/19/01 Adm. Date:  05/17/2001   CC:         [name] A. Patty Sermons, M.D.  Suzzette Righter, M.D.  Doylene Canning. Ladona Ridgel, M.D. [facility]   Cardiac Catheterization  HISTORY:  [name] is referred for evaluation of a syncopal episode.  She has a history of hypertension, a history of atrial fibrillation, and a history of aortic insufficiency and congestive heart failure.  PROCEDURE:  Left heart catheterization with selective coronary angiography, left ventricular angiography, and aortic root angiography with Perclose.  TYPE AND SITE OF ENTRY:  Percutaneous right femoral artery with Perclose.  CATHETERS:  A 6-French four-curve Judkins right coronary catheter, a 6-French five-curve Judkins left coronary catheter, a 6-French pigtail ventriculographic catheter.  CONTRAST MATERIAL:  Omnipaque.  MEDICATIONS GIVEN PRIOR TO PROCEDURE:  Valium 10 mg p.o.  MEDICATIONS GIVEN DURING PROCEDURE:  Versed 2 mg IV.  COMMENTS:  The patient tolerated the procedure well.  HEMODYNAMIC DATA:  The aortic pressure was 184/89.  The LV pressure was 190/16-26.  There was no aortic valve gradient noted on pullback.  ANGIOGRAPHIC DATA:  Left ventricular angiogram was performed in an RAO position.  The left ventricle was mildly enlarged.  Global ejection fraction was estimated to be at 45%.  There is some mid anterior wall hypokinesia.  There is no mitral regurgitation, intracardiac calcification, or intracavitary filling defect.  Coronary arteries:  The coronary arteries arise and distribute normally. 1. The left main coronary artery is normal. 2. The left circumflex continues as a somewhat tortuous obtuse marginal.  It    is normal. 3. Left  anterior descending:  The left anterior descending artery is a large    vessel that wraps around the apex.  It has a large proximal diagonal    vessel.  It is normal. 4. Right coronary artery:  The right coronary artery is a dominant vessel.  It    is normal.  The aortic root was performed using 30 cc of contrast at 20 cc per second. There was 2+ aortic insufficiency noted.  The aortic root was enlarged and required a five-curve catheter to reach the left coronary ostium.  OVERALL IMPRESSION: 1. Moderate aortic insufficiency. 2. Normal coronary arteries. 3. Mild dilatation of the aortic root. 4. Left ventricular enlargement with mildly reduced global left ventricular    function. 5. Mild increase in left ventricular end diastolic pressure.Dictated by: Colleen Can Deborah Chalk, M.D. Attending Physician:  Rudean Hitt DD:  05/19/01 TD:  05/19/01 Job: [id number]

## 2011-02-14 NOTE — H&P (Signed)
[place]. [facility]  Patient:    [name], [name]                     MRN: [id number] Adm. Date:  [id number] Attending:  Miguel Aschoff CC:         Clovis Pu. Patty Sermons, M.D.  Faith Rogue, M.D.   History and Physical  CHIEF COMPLAINT: "My leg is swollen."  HISTORY OF PRESENT ILLNESS: [name] is a 70 year old woman who was discharged one week ago after a one month hospitalization for hemorrhagic CVA suffered on New Years Eve.  [name] carries a diagnosis of hypertension, and in October 2001 was diagnosed with new onset atrial fibrillation.  She was begun at that time on Coumadin, at the time of her hemorrhagic CVA her INR was supertherapeutic at 4.  She sustained marked right hemiparesis, and underwent a successful rehabilitation period, discharged ambulatory with the assistance of a walker, with significantly recovered motor function, although not at her baseline.  Today while at the outpatient rehabilitation facility she reported her concern about a progressively swollen, tight right leg to staff members.  Dr. Riley Kill ordered a venous Doppler study at [facility], the results of which were positive for a right lower extremity DVT.  [name] does not have a primary care physician with admitting privileges, and I was asked to help facilitate her treatment.  [name] feels that her swelling actually began while she was in the hospital.  She was treated with DVT prophylactic doses of Lovenox.  She has not experienced chest pain, and there has been no change in her chronic exertional shortness of breath.  She has otherwise felt well, and is pleased with her recovery from the stroke.  PAST MEDICAL HISTORY:  1. Left intracerebral hemorrhage, September 28, 2000.  2. Chronic atrial fibrillation, onset October 2001; anticoagulated until     September 28, 2000.  3. Hypertension, severe in the early 1990s, for which she  underwent a renal     arteriogram - which was negative.  Nuclear stress test one year ago was     normal.  Echocardiogram in October 2001 showed an ejection fraction of     50%, 2+ aortic insufficiency, mild mitral regurgitation, and mild LVH.  4. Exposure to passive smoke.  5. Exogenous obesity.  6. Family history of colon cancer.  FAMILY HISTORY: Significant for father with colon cancer.  SOCIAL HISTORY: The patient has been exposed to passive smoke.  She does not drink alcohol.  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  CURRENT MEDICATIONS:  1. Hydrochlorothiazide 12.5 mg p.o. q.d.  2. Lanoxin 0.25 mg p.o. q.d.  3. K-Dur 20 mEq p.o. q.d.  4. Toprol XL 50 mg p.o. q.d.  5. Enteric-coated aspirin 325 mg p.o. q.d.  PHYSICAL EXAMINATION:  VITAL SIGNS: Temperature 97.1 degrees, blood pressure 146/55, pulse 74, respirations 20.  Oxygen saturation 98% on room air.  GENERAL: She is a moderately obese lady, anxiety and worried about her new diagnosis and the prospect of readmission.  Her color is good, with no pallor of the lips or nail beds.  HEENT: Her face is symmetric, with 3 mm pupils that are reactive to light, EOMI, no facial droop, and symmetric tongue and pharynx.  NECK: There is no JVD on neck examination, nor carotid bruits.  LUNGS: Clear  to auscultation, but with decreased breath sounds at the bases and relatively poor exertion simply effort related.  HEART: Regular rate and rhythm, with soft ejection murmur at the left sternal border.  ABDOMEN: Soft, nontender.  Normoactive bowel sounds.  Abdominal skin is notable for multiple ecchymoses secondary to Lovenox injections.  EXTREMITIES: Lower extremities are asymmetric, with approximately 3+ edema on the right to above the thigh, 1+ chronic edema on the left.  She has tenderness of the posterior thigh and  popliteal space but I cannot palpate a cord.  There is no color differential, and temperature is also without differential.  Pulses in the lower extremities reveal trace dorsalis pedis on the left with 1+ posterior tibial pulses, 1+ dorsalis pedis on the right with absent posterior tibial pulses.  She has good capillary refill of the toes and no evidence of ulceration or chronic changes.  She has fairly well recovered strength in the upper extremities, but has some limitation in extremes of arm abduction at the shoulder, and leg elevation at the hip.  LABORATORY DATA: Chest x-ray shows cardiomegaly, clear lung fields.  EKG shows atrial fibrillation, rate 61; old Q waves in pericardial leads V1 through V3; ST depression of inferolateral leads, more exaggerated than her baseline; T wave inversion and mild ST depression; at the greatest, 2 mm depression in lead 2.  Laboratories are pending.  ASSESSMENT/PLAN:  1. Right lower extremity deep vein thrombosis despite inpatient deep vein     thrombosis prophylaxis with Lovenox, with contraindication to     anticoagulation therapy given her recent hemorrhagic cerebrovascular     accident.  Our only alternative is to place an inferior vena cava filter.     The leg will be elevated, although I suspect she will have a slow     resolution of her edema, and will likely have post deep vein thrombosis     syndrome.  Fortunately, she does not have significant pain.  I have     discussed this situation with Dr. Jacki Cones of the neurology service,     and we will determine if in the future there may be a safe window of     opportunity to begin anticoagulation in her atrial fibrillation setting.     In the acute period for her deep vein thrombosis, however, this will not     be possible.  We will continue aspirin.  2. Atrial fibrillation, chronic.  Long-term care may actually involve the     consideration of DC cardioversion if transesophageal  echocardiogram can      rule out thrombus.  Dr. Patty Sermons is out of town, but we will consult his     colleagues on this issue.  3. Ischemic appearing electrocardiograms.  [name] certainly has some     changes exaggerated from her baseline.  She has not experienced chest     pain, and has not been under exertional circumstances.  She has no prior     history of coronary disease, and one year ago had a negative stress     nuclear scan.  She is already on a beta-blocker, we will continue the     aspirin, I will add nitroglycerin, and we will monitor her on telemetry     with assessment of cardiac enzymes to rule out new ischemic event.  She     is stable at this time. DD:  11/05/00 TD:  11/06/00 Job: [id number]

## 2011-02-14 NOTE — Discharge Summary (Signed)
[place]. [facility]  Patient:    [name], [name]                     MRN: [id number] Adm. Date:  [id number] Disc. Date: 10/30/00 Attending:  Faith Rogue T [name]:   [name], P.A. CC:         [name], M.D.             [name], M.D.             [name], [facility]                           Discharge Summary  DISCHARGE DIAGNOSES: 1. 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 2. Atrial fibrillation. 3. Hypertension. 4. Congestive heart failure.  HISTORY OF PRESENT ILLNESS:  [age]-year-old white female with chronic Coumadin for atrial fibrillation admitted September 28, 2000 with right facial droop, altered mental status.  No chest pain or shortness of breath, no nausea or vomiting.  On evaluation, cranial CT scan showed a left intracerebral hemorrhage, basal ganglia, mild mass effect, evidence of previous bilateral lacunar infarction.  Carotid duplex negative.  Echocardiogram with normal left ventricular function.  INR on admission 4.3, received vitamin K in the emergency room.  Modified barium swallow October 03, 2000, placed on dysphagia III nectar thick liquids.  Hypokalemia 3.1 and supplemented.  Followup CT of the head October 08, 2000 with resolving left intracerebral hematoma without new abnormality.  Followup neurology service with Dr. Thad [name] and cardiology service Dr. Patty [name].  Repeat modified barium swallow October 12, 2000 unchanged.  Cardiac rate remained controlled.  Toprol had been increased October 12, 2000.  Required total assist to stand.  Latest chemistries unremarkable.  Chest x-ray negative.  EKG October 06, 2000 with atrial fibrillation unchanged.  Admitted for a comprehensive rehab program.  PAST MEDICAL HISTORY:  See discharge diagnoses.  ALLERGIES:  TAPE.  HABITS:  No alcohol or tobacco.  MEDICATIONS PRIOR TO ADMISSION:  Coumadin daily, Micardis 80-12.5 daily, Lanoxin 0.25  daily, Lasix 20 mg as needed.  SOCIAL HISTORY:  Lives in [place] with daughter and son-in-law. Independent prior to admission.  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  It is a split-level home.  HOSPITAL COURSE:  Patient did well while on rehabilitation services with therapies initiated on a b.i.d. basis.  The following issues were followed during patients rehab course.  Pertaining to [name] left intracerebral hemorrhage, remained stable, followup CT showed resolving hematoma.  She had right-sided weakness that had greatly improved now grossly graded at 4-/5. Her speech again was most intelligible with dramatic progress.  She was ambulating 90 feet with close supervision now.  She did fatigue easily even though her endurance had greatly improved.  Her diet had been advanced per speech therapy using chin tuck.  She would receive ongoing therapies.  Family teaching was completed.  A day pass went well.  She was to be discharged on October 30, 2000.  She was followed by cardiology services Dr. Patty [name] for her atrial fibrillation, cardiac rate remained controlled, she had been on Coumadin prior to hospital admission, this has since been discontinued due to her intracerebral hemorrhage.  She was recently placed on Ecotrin and would be followed by cardiology services.  Her blood pressures remained controlled. There were no signs of heart failure, no signs of fluid overload, her lungs remained clear  to auscultation, no bowel or bladder disturbances.  Latest labs showed a sodium 139, potassium 4.3, BUN 22, creatinine of 1.1, hemoglobin 16.5, hematocrit 49.8, digoxin level 1.3, TSH 3.896.  DISCHARGE MEDICATIONS: 1. Hydrochlorothiazide 12.5 mg daily. 2. Potassium chloride 20 mEq daily. 3. Lanoxin 0.25 mg daily. 4. Toprol XL 50 mg daily. 5. Aspirin 325 mg daily. 6. The patient had been on Ritalin earlier on in her hospital course with good    results, this  would be tapered over time, it was 2.5 mg twice daily.  ACTIVITY:  As tolerated.  DIET:  Regular.  SPECIAL INSTRUCTIONS:  No driving.  Ongoing therapies as advised.  FOLLOWUP:  Dr. Patty [name], cardiology services and [name], medical management. DD:  10/29/00 TD:  10/29/00 Job: [id number]

## 2011-02-14 NOTE — Consult Note (Signed)
[place]. [facility]  Patient:    [name], [name]                     MRN: [id number] Proc. Date: 11/06/00 Adm. Date:  [id number] Disc. Date: [id number] Attending:  Miguel Aschoff                          Consultation Report  DATE OF BIRTH:  [birth year]  REQUESTING PHYSICIAN:  Miguel Aschoff, M.D.  REASON FOR EVALUATION:  Acute DVT, history of intracerebral hemorrhage.  HISTORY OF PRESENT ILLNESS:  This is a consultation evaluation of this 70 year old woman very familiar to me from an admission to [facility] in January 2002.  Briefly, she is a 70 year old woman who experienced new onset atrial fibrillation in October for which she was anticoagulated with Coumadin.  She was admitted on September 28, 2000 with an acute right ______ intracerebral hemorrhage in the setting of therapeutic INR of 4.0.  She had a lengthy stay in the hospital.  Was ultimately transferred to the [facility] and went home two weeks ago.  Her Coumadin was stopped and she was on no anticoagulation for three weeks after she was transferred to the [facility]. Aspirin was restarted due to the persistence of atrial fibrillation and persisted elevated risk of embolic stroke.  She is presently readmitted with an acute right lower extremity deep venous thrombosis.  She underwent placement of an IVC filter yesterday.  I was asked by Dr. Mayford Knife to comment on issues regarding her anticoagulation.  PAST MEDICAL HISTORY:  As above.  She also has a history of hypertension and obesity.  REVIEW OF SYSTEMS:  Per HPI.  Of note, her right hemiparesis has gotten much better and her aphasia is almost completely resolved.  PHYSICAL EXAMINATION:  GENERAL:  She is alert, in no evident distress.  NEUROLOGIC:  She is alert and completely oriented.  Her speech is fluent without paraphasic errors.  Limited neurologic examination reveals only slight slowness on fine motor function in the right  upper extremity along with a very mild weakness in the extremity.  This represents a clear improvement from her discharge examination from her acute hospitalization.  IMPRESSION:  Acute right lower extremity deep venous thrombosis in the setting of recent (less than six weeks old) primary intracerebral hemorrhage occurring in the setting of supratherapeutic Coumadin.  She has two strong indications for systemic anticoagulation, namely her deep venous thrombosis and her atrial fibrillation.  However, I feel that in spite of this that the recent intracerebral hemorrhage represents a contraindication to Coumadin therapy, at least in conventional doses and I am not convinced there is any evidence lower doses would be helpful without conferring increased risk.  I discussed this extensively with Dr. Mayford Knife including the possibility of using other heparinoids such as Lovenox, ______, etcetera and we felt that these were likely unsafe due to the inability to reverse these agents.  Accordingly, I would recommend she be treated with her IVC filter and aspirin therapy, at least for the next several weeks.  Thank you for the courtesy of the consultation.  I will be available for followup if any further questions arise. DD:  11/06/00 TD:  11/08/00 Job: [id number]

## 2011-02-14 NOTE — Discharge Summary (Signed)
[place]. [facility]  Patient:    [name], [name] Visit Number: [id number] MRN: [id number]          Service Type: MED Location: [id number] Attending Physician:  Rudean Hitt Dictated by:   Clovis Pu Patty Sermons, M.D. Admit Date:  05/17/2001 Discharge Date: 05/21/2001   CC:         Deanna Artis. Sharene Skeans, M.D.  Nathen May, M.D., [facility]  Dyanne Carrel, M.D.   Discharge Summary  FINAL DIAGNOSES: 1. Atrial fibrillation. 2. Status post cardiac arrest. 3. Congestive heart failure. 4. Aortic valve insufficiency. 5. Hypertensive cardiovascular disease.  OPERATIONS PERFORMED:  Implantation of a cardiac defibrillator with cardiac catheterization.  Left heart cardiac catheterization on May 19, 2001 by Dr. Deborah Chalk.  Implantation of a cardioverter defibrillator by Dr. Graciela Husbands on May 20, 2001.  HISTORY OF PRESENT ILLNESS:  This 70 year old woman was admitted as an emergency after having a seizure with apparent cardiac arrest in my office earlier today.  She has had a history of longstanding hypertension.  We saw her initially in 41 and then intermittently until 1993.  She was lost to followup until October 2001 when she was again seen in our office with a history of cardiomegaly and new onset of atrial fibrillation.  The patient was on Coumadin and had an intracerebral hemorrhage on New Years Eve 2001.  She manifested this as right-sided weakness and expressive aphasia.  In February 2002, she was readmitted to the hospital service with leg swelling and found to have DVT and had an inferior vena cava filter placed.  In June, she was seen in our office with increasing difficulty breathing and was found to have CHF.  On June 17, she returned improved in terms of her CHF.  March 29, 2001, the patient called stating that she was having trouble with dizziness and slow pulse and she was advised at that point to stop her Toprol and  also stop her Lanoxin.  July 3, she was seen in the office complaining of dizziness and a sensation of spinning and vertigo and was treated with meclizine.  Blood pressure was 180/80 at that point with a pulse irregular at 50 per minute and she was placed on Hyzaar for blood pressure and CHF.  The day of admission she came to the office for her followup exam and was having no cardiac complaints but had been complaining of some left groin and hip pain.  Towards the end of the exam she laid down on the hospital bed and had a period of unresponsiveness during which she had tonic clonic movements of her upper extremities, pupils became dilated bilaterally and there was no pulse or respirations and the patient became cyanotic.  She was given a few chest percussions at which time her pupils began to narrow, cyanosis resolved, and after a few more minutes the patient was awake although lethargic and able to answer questions.  EKG taken at that point showed atrial fibrillation with runs of ventricular arrhythmia and she was taken by ambulance to [facility] for further evaluation.  PHYSICAL EXAMINATION:  VITAL SIGNS:  Blood pressure of 170/80, pulse of 60 in atrial fibrillation.  HEENT:  Exam after the arrest showed equal pupils, round and reactive to light and accommodation.  Jugular venous pressure was normal.  LUNGS:  Clear.  HEART:  Grade 3/6 decrescendo murmur of aortic insufficiency.  ABDOMEN:  Negative.  EXTREMITIES:  Good pulses.  No edema.  HOSPITAL COURSE:  The patient was admitted to a monitored bed.  Electrolytes were checked stat.  We requested a neurology consult and made arrangements for telemetry monitoring as well as getting neurologic workup with an EEG and a brain scan.  Dr. Sharene Skeans saw the patient and felt that her syncope and cardiac arrest was probably on the basis of a cardiac arrhythmia with a run on ventricular arrhythmia which caused decreased perfusion.  He  doubted but could not rule out that this might have been an anniversary seizure related to her previous intracerebral hemorrhage.  The brain scan showed that the left subcortical hemorrhage had shrunken considerably since the previous study. The electroencephalogram showed left focal slowing but no seizure activity. 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 Graciela Husbands was asked to see the patient for consideration of an implantation of a defibrillator.  Dr. Graciela Husbands and Dr. Lewayne Bunting saw the patient and felt that she should also have coronary angiography to definitively exclude obstructive coronary disease.  She underwent cardiac catheterization which showed normal coronaries, a global ejection fraction of 45%, mildly enlarged left ventricle, aortic root dilatation with 2+ aortic insufficiency.  Therefore, there was no ischemic substrate to explain her arrhythmia and with those results in mind the patient then went ahead and underwent implantation of an ICD by Dr. Graciela Husbands on May 20, 2001.  The patient did well after implantation.  Her underlying rhythm remained atrial fibrillation and her ventricular response was controlled with increasing doses of Lopressor.  By May 21, 2001, she was stable enough to be discharged home.  The patient was discharged on aspirin 325 one daily, Lopressor 50 mg one half tablet twice a day, Lasix 40 mg one half tablet daily, K-Dur 20 one twice a day, Centrum Silver multivitamin one daily, Darvocet-N 100 if needed for pain. She is to walk as tolerated.  She is not to do any driving for six months. She will be on a low cholesterol diet.  She is to keep the wound dry.  She is to limit the motion of the left arm.  She is to see Dr. Graciela Husbands in one week at the pacemaker clinic at [facility].  She will see Dr. Patty Sermons in two weeks  for office visit and BMP.  She was instructed that if the  defibrillator fired one time and she felt okay she is to call the office.  If the defibrillator fires one time and not okay or if it fires two times in a row or three times in one day she is to call 911 and get to the emergency room.  X-RAY AND LABORATORY DATA:  Potassium on admission 3.2; on discharge 4.6.  BUN of 32 on admission; 15 on discharge.  Blood sugar 120 on admission; 90 on discharge.  Liver function studies were normal.  Initial troponin is 0.3; followup troponin 0.21.  The MBs were negative x 2.  Final troponin was 0.16.  CONDITION ON DISCHARGE:  Improved. Dictated by:   Clovis Pu Patty Sermons, M.D. Attending Physician:  Rudean Hitt DD:  06/18/01 TD:  06/18/01 Job: [id number]

## 2011-02-14 NOTE — Procedures (Signed)
[facility]  Patient:    [name], [name]                     MRN: [id number] Proc. Date: 04/08/00 Adm. Date:  [id number] Attending:  Charna Elizabeth CC:         Dr. Meliton Rattan, [facility]                           Procedure Report  DATE OF BIRTH:  01-30-1941.  REFERRING PHYSICIAN:  Dr. Meliton Rattan.  PROCEDURE PERFORMED:  Colonoscopy with hot biopsy x 1.  ENDOSCOPIST:  Anselmo Rod, M.D.  INSTRUMENT USED:  Olympus video colonoscope.  INDICATIONS FOR PROCEDURE:  Guaiac positive stool and family history of colon cancer in a 70 year old white female rule out polyps, masses, hemorrhoids, etc.  PREPROCEDURE PREPARATION:  Informed consent was procured from the patient. The patient was fasted for eight hours prior to the procedure and prepped with a bottle of magnesium citrate and a gallon of NuLytely the night prior to the procedure.  PREPROCEDURE PHYSICAL:  The patient had stable vital signs.  Neck supple. Chest clear to auscultation.  S1, S2 regular.  Abdomen soft with normal abdominal bowel sounds.  DESCRIPTION OF PROCEDURE:  The patient was placed in the left lateral decubitus position and sedated with 75 mg of Demerol and 7 mg of Versed intravenously.  Once the patient was adequately sedated and maintained on low-flow oxygen and continuous cardiac monitoring, the Olympus video colonoscope was advanced from the rectum to the cecum without difficulty. Except for a small sessile polyp in the rectum, no other abnormalities were seen.  The patient tolerated the procedure well without complication.  IMPRESSION:  Essentially healthy-appearing colon except for a small sessile polyp removed from the rectum.  RECOMMENDATIONS: 1. Await pathology results. 2. Outpatient follow-up in the next two weeks for further recommendations.DD: 04/08/00 TD:  04/08/00 Job: [id number]

## 2011-02-17 ENCOUNTER — Ambulatory Visit (INDEPENDENT_AMBULATORY_CARE_PROVIDER_SITE_OTHER): Payer: Medicare Other | Admitting: *Deleted

## 2011-02-17 DIAGNOSIS — I509 Heart failure, unspecified: Secondary | ICD-10-CM

## 2011-02-17 DIAGNOSIS — I4891 Unspecified atrial fibrillation: Secondary | ICD-10-CM

## 2011-02-17 DIAGNOSIS — I428 Other cardiomyopathies: Secondary | ICD-10-CM

## 2011-02-17 NOTE — Progress Notes (Signed)
icd check in clinic  

## 2011-03-12 ENCOUNTER — Ambulatory Visit (INDEPENDENT_AMBULATORY_CARE_PROVIDER_SITE_OTHER): Payer: Medicare Other | Admitting: Cardiology

## 2011-03-12 ENCOUNTER — Encounter: Payer: Self-pay | Admitting: Cardiology

## 2011-03-12 VITALS — BP 136/74 | HR 74 | Wt 202.0 lb

## 2011-03-12 DIAGNOSIS — I4891 Unspecified atrial fibrillation: Secondary | ICD-10-CM

## 2011-03-12 DIAGNOSIS — I352 Nonrheumatic aortic (valve) stenosis with insufficiency: Secondary | ICD-10-CM

## 2011-03-12 DIAGNOSIS — I359 Nonrheumatic aortic valve disorder, unspecified: Secondary | ICD-10-CM

## 2011-03-12 NOTE — Assessment & Plan Note (Signed)
The patient has a history of known aortic insufficiency.  She reports that since last visit her breathing has been better.  She has not been having any personal nocturnal dyspnea.  Her exertional dyspnea is improved.  She denies any chest pains.  She remains on furosemide 40 mg daily.  She has not been having any significant peripheral edema recently.

## 2011-03-12 NOTE — Assessment & Plan Note (Addendum)
The patient has a history of chronic atrial fibrillation.  She is not on Coumadin because of a prior intracerebral hemorrhage.  She is on Aspirin 325 mg.  She has not been expressing any TIA symptoms.  She has a inferior vena cava filter which was placed when she had DVT in the past

## 2011-03-12 NOTE — Progress Notes (Signed)
[name] Date of Birth:  12-10-1940 [facility] [address]   [address] [place], [place]  [address] [phone]           Fax   [phone]  History of Present Illness: This pleasant 70 year old woman is seen for a scheduled followup office visit.  She has a complex past medical history.  She has a past history of ventricular tachycardia and has an ICD in place.  She has a history of aortic insufficiency.  She's had hypertensive cardiovascular disease with LVH.  She is in chronic atrial fibrillation.  Because of a previous intracerebral hemorrhage she is not on Coumadin.  She has a remote history of DVT and has a inferior vena cava [place] filter in place.  She had cardiac catheterization in 2002 which did not show any coronary artery disease.  She's had a history of exogenous obesity.  She's had a history of gout.  Her last echocardiogram on 10/17/09 showed ejection fraction of 45-50% with moderate aortic stenosis and marked aortic insufficiency and mild mitral regurgitation and mild tricuspid regurgitation with moderate pulmonary hypertension.  Her pulmonary artery pressure was 52.  Since last visit her symptoms of exertional dyspnea and her peripheral edema have improved.  Current Outpatient Prescriptions  Medication Sig Dispense Refill  . allopurinol (ZYLOPRIM) 300 MG tablet Take 1 tablet (300 mg total) by mouth daily.  30 tablet  11  . amLODipine (NORVASC) 5 MG tablet Take 5 mg by mouth daily.        [name] aspirin 325 MG tablet Take 325 mg by mouth daily.        . colchicine 0.6 MG tablet Take 0.6 mg by mouth daily.        . furosemide (LASIX) 40 MG tablet Take 40 mg by mouth daily.       . Hydrocod Polst-Chlorphen Polst (TUSSIONEX PENNKINETIC ER PO) Take by mouth as needed.        [name] losartan (COZAAR) 100 MG tablet Take 100 mg by mouth daily.        . metoprolol succinate (TOPROL-XL) 25 MG 24 hr tablet Take 25 mg by mouth daily.        . nitroGLYCERIN  (NITROSTAT) 0.4 MG SL tablet Place 0.4 mg under the tongue every 5 (five) minutes as needed.        . potassium chloride SA (K-DUR,KLOR-CON) 20 MEQ tablet Take 1 tablet (20 mEq total) by mouth daily.  30 tablet  11  . rosuvastatin (CRESTOR) 5 MG tablet Take 5 mg by mouth daily.          Allergies  Allergen Reactions  . Latex   . Triple Antibiotic     Patient Active Problem List  Diagnoses  . HYPERLIPIDEMIA  . GOUT  . HYPERKALEMIA  . HYPERTENSION  . Atrial fibrillation  . CONGESTIVE HEART FAILURE  . CVA  . VENOUS INSUFFICIENCY  . BACK PAIN, LUMBAR  . FATIGUE  . MEMORY LOSS  . EDEMA  . FOOT PAIN, BILATERAL  . COMBINED HEART FAILURE, ACUTE ON CHRONIC  . SHORTNESS OF BREATH  . ALKALINE PHOSPHATASE, ELEVATED  . Aortic insufficiency and aortic stenosis    History  Smoking status  . Never Smoker   Smokeless tobacco  . Not on file    History  Alcohol Use     Family History  Problem Relation Age of Onset  . Hypertension Father   . Cancer Father     Review  of Systems: Constitutional: no fever chills diaphoresis or fatigue or change in weight.  Head and neck: no hearing loss, no epistaxis, no photophobia or visual disturbance. Respiratory: No cough, shortness of breath or wheezing. Cardiovascular: No chest pain peripheral edema, palpitations. Gastrointestinal: No abdominal distention, no abdominal pain, no change in bowel habits hematochezia or melena. Genitourinary: No dysuria, no frequency, no urgency, no nocturia. Musculoskeletal:No arthralgias, no back pain, no gait disturbance or myalgias. Neurological: No dizziness, no headaches, no numbness, no seizures, no syncope, no weakness, no tremors. Hematologic: No lymphadenopathy, no easy bruising. Psychiatric: No confusion, no hallucinations, no sleep disturbance.    Physical Exam: Filed Vitals:   03/12/11 1112  BP: 136/74  Pulse: 74  The general appearance reveals a well-developed well-nourished Caucasian  female in no distress.Pupils equal and reactive.   Extraocular Movements are full.  There is no scleral icterus.  The mouth and pharynx are normal.  The neck is supple.  The carotids reveal no bruits.  The jugular venous pressure is normal.  The thyroid is not enlarged.  There is no lymphadenopathy.The chest is clear to percussion and auscultation. There are no rales or rhonchi. Expansion of the chest is symmetrical.  The heart reveals a grade 3/6 decrescendo murmur of aortic insufficiency.The abdomen is soft and nontender. Bowel sounds are normal. The liver and spleen are not enlarged. There Are no abdominal masses. There are no bruits.The pedal pulses are good.  There is no phlebitis or edema.  There is no cyanosis or clubbing.Strength is normal and symmetrical in all extremities.  There is no lateralizing weakness.  There are no sensory deficits.The skin is warm and dry.  There is no rash.  EKG shows atrial fibrillation left anterior hemiblock with left ventricular hypertrophy and strain.  The left axis deviation is new since previous tracing   Assessment / Plan:  Continue same medication.  Recheck in 4 months for followup office visit and lab work

## 2011-03-13 ENCOUNTER — Encounter: Payer: Self-pay | Admitting: Cardiology

## 2011-04-08 ENCOUNTER — Telehealth: Payer: Self-pay | Admitting: Cardiology

## 2011-04-08 NOTE — Telephone Encounter (Signed)
Ok? If so will write up

## 2011-04-08 NOTE — Telephone Encounter (Signed)
Dr. Jules Schick [facility] [address], [place], [place] [address], Phone # - [phone]. Needs Surgical clearance for cataract surgery. I have pulled her chart.

## 2011-04-09 NOTE — Telephone Encounter (Signed)
Faxed to [phone]

## 2011-04-09 NOTE — Telephone Encounter (Signed)
The patient is cleared from a cardiology standpoint for her cataract surgery.

## 2011-04-16 ENCOUNTER — Telehealth: Payer: Self-pay | Admitting: Cardiology

## 2011-04-16 NOTE — Telephone Encounter (Signed)
Advised letter sent re-clearance for cataract surgery

## 2011-04-16 NOTE — Telephone Encounter (Signed)
PT CHECKING TO SEE IF A LETTER WENT OUT TO ANOTHER MD ABOUT HER HAVING EYE SURGERY. SHE IS WANTING TO MAKE SURE THIS GOT DONE. CHART IN BOX.

## 2011-05-14 ENCOUNTER — Other Ambulatory Visit: Payer: Self-pay | Admitting: *Deleted

## 2011-05-14 DIAGNOSIS — I119 Hypertensive heart disease without heart failure: Secondary | ICD-10-CM

## 2011-05-14 DIAGNOSIS — E785 Hyperlipidemia, unspecified: Secondary | ICD-10-CM

## 2011-05-14 MED ORDER — ROSUVASTATIN CALCIUM 5 MG PO TABS: 5.0000 mg | ORAL_TABLET | Freq: Every day | ORAL | Status: DC

## 2011-05-14 MED ORDER — LOSARTAN POTASSIUM 100 MG PO TABS: 100.0000 mg | ORAL_TABLET | Freq: Every day | ORAL | Status: DC

## 2011-05-14 NOTE — Telephone Encounter (Signed)
Refilled meds per fax request.  

## 2011-05-22 ENCOUNTER — Encounter: Payer: Medicare Other | Admitting: *Deleted

## 2011-05-29 ENCOUNTER — Encounter: Payer: Self-pay | Admitting: *Deleted

## 2011-06-09 ENCOUNTER — Other Ambulatory Visit: Payer: Self-pay | Admitting: Internal Medicine

## 2011-06-09 ENCOUNTER — Telehealth: Payer: Self-pay | Admitting: Internal Medicine

## 2011-06-09 ENCOUNTER — Ambulatory Visit (INDEPENDENT_AMBULATORY_CARE_PROVIDER_SITE_OTHER): Payer: Medicare Other | Admitting: *Deleted

## 2011-06-09 ENCOUNTER — Encounter: Payer: Self-pay | Admitting: Internal Medicine

## 2011-06-09 DIAGNOSIS — I472 Ventricular tachycardia: Secondary | ICD-10-CM

## 2011-06-09 DIAGNOSIS — I4891 Unspecified atrial fibrillation: Secondary | ICD-10-CM

## 2011-06-09 NOTE — Telephone Encounter (Signed)
Pt calling regarding sending a remote device check. Pt unsure as to how exactly to send a remote device check. Please return call to discuss further.

## 2011-06-09 NOTE — Telephone Encounter (Signed)
Spoke w/pt and pt to send transmission this evening. Will call pt on 9-11 to let know transmission was received or not. Pt understands and to send this evening.

## 2011-06-11 LAB — REMOTE ICD DEVICE
BATTERY VOLTAGE: 3.14 V
BRDY-0002RV: 50 {beats}/min
CHARGE TIME: 9.329 s
PACEART VT: 0
RV LEAD AMPLITUDE: 12.3136 mv
TOT-0002: 0
TOT-0006: 20091020000000
TZAT-0001FASTVT: 1
TZAT-0001SLOWVT: 1
TZAT-0001SLOWVT: 2
TZAT-0002FASTVT: NEGATIVE
TZAT-0004SLOWVT: 8
TZAT-0004SLOWVT: 8
TZAT-0005SLOWVT: 81 pct
TZAT-0005SLOWVT: 91 pct
TZAT-0012FASTVT: 200 ms
TZAT-0018FASTVT: NEGATIVE
TZAT-0018SLOWVT: NEGATIVE
TZAT-0019FASTVT: 8 V
TZAT-0020SLOWVT: 1.5 ms
TZAT-0020SLOWVT: 1.5 ms
TZON-0003VSLOWVT: 450 ms
TZON-0004VSLOWVT: 20
TZST-0001FASTVT: 5
TZST-0001FASTVT: 6
TZST-0001SLOWVT: 3
TZST-0001SLOWVT: 5
TZST-0001SLOWVT: 6
TZST-0002FASTVT: NEGATIVE
TZST-0002FASTVT: NEGATIVE
TZST-0002FASTVT: NEGATIVE
TZST-0002FASTVT: NEGATIVE
TZST-0003SLOWVT: 35 J
VENTRICULAR PACING ICD: 18.45 pct

## 2011-06-16 NOTE — Progress Notes (Signed)
icd remote check  

## 2011-07-03 ENCOUNTER — Encounter: Payer: Self-pay | Admitting: Internal Medicine

## 2011-07-03 ENCOUNTER — Ambulatory Visit (INDEPENDENT_AMBULATORY_CARE_PROVIDER_SITE_OTHER): Payer: Medicare Other | Admitting: Internal Medicine

## 2011-07-03 ENCOUNTER — Ambulatory Visit (INDEPENDENT_AMBULATORY_CARE_PROVIDER_SITE_OTHER): Payer: Medicare Other | Admitting: *Deleted

## 2011-07-03 ENCOUNTER — Other Ambulatory Visit: Payer: Self-pay | Admitting: Internal Medicine

## 2011-07-03 VITALS — BP 118/70 | HR 83 | Temp 97.8°F | Resp 18 | Wt 199.0 lb

## 2011-07-03 DIAGNOSIS — I5043 Acute on chronic combined systolic (congestive) and diastolic (congestive) heart failure: Secondary | ICD-10-CM

## 2011-07-03 DIAGNOSIS — I4891 Unspecified atrial fibrillation: Secondary | ICD-10-CM

## 2011-07-03 DIAGNOSIS — I469 Cardiac arrest, cause unspecified: Secondary | ICD-10-CM

## 2011-07-03 DIAGNOSIS — R42 Dizziness and giddiness: Secondary | ICD-10-CM

## 2011-07-03 MED ORDER — AMOXICILLIN 500 MG PO CAPS: 500.0000 mg | ORAL_CAPSULE | Freq: Three times a day (TID) | ORAL | Status: AC

## 2011-07-03 MED ORDER — HYDROCOD POLST-CHLORPHEN POLST 10-8 MG/5ML PO LQCR: 5.0000 mL | Freq: Two times a day (BID) | ORAL | Status: DC | PRN

## 2011-07-03 NOTE — Assessment & Plan Note (Signed)
Neurologically nonfocal. With left ear effusion attempt course of abx. Obtain cbc, chem7. Followup if no improvement or worsening.

## 2011-07-03 NOTE — Progress Notes (Signed)
  Subjective:    Patient ID: [name], female    DOB: 1941/05/26, 70 y.o.   MRN: [id number]  HPI Pt presents to clinic for evaluation of dizziness. Notes 3-4 day h/o dizziness without presyncope or syncope. No neurologic sx's and denies palpitations. Not clearly associated with postural change. Does not believe sx's worsen with head turning. Sx's are intermittent and improved since onset. No alleviating or exacerbating factors. Taking no medication for the problem. H/o VT with icd and states had recent device interrogation.   Past Medical History  Diagnosis Date  . History of ventricular tachycardia   . ICD (implantable cardiac defibrillator) in place   . DOE (dyspnea on exertion)   . Peripheral edema   . Aortic insufficiency   . 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 History of gout   . Exogenous obesity   . History of CHF (congestive heart failure)    Past Surgical History  Procedure Date  . Cardiac defibrillator placement   . Cardiac catheterization     reports that she has never smoked. She has never used smokeless tobacco. She reports that she does not drink alcohol or use illicit drugs. family history includes Cancer in her father and Hypertension in her father. Allergies  Allergen Reactions  . Latex   . Triple Antibiotic        Review of Systems  Constitutional: Negative for fever and chills.  Respiratory: Negative for cough and chest tightness.   Cardiovascular: Negative for chest pain.  Neurological: Positive for dizziness. Negative for syncope, facial asymmetry, speech difficulty, weakness, numbness and headaches.  Psychiatric/Behavioral: Negative for confusion.       Objective:   Physical Exam  Nursing note and vitals reviewed. Constitutional: She appears well-developed and well-nourished. No distress.  HENT:  Head: Normocephalic and atraumatic.  Right Ear: Tympanic membrane, external ear and ear canal normal.  Left Ear: External  ear and ear canal normal. No foreign bodies. Tympanic membrane is not injected and not retracted. A middle ear effusion is present.  Nose: Nose normal.  Mouth/Throat: Oropharynx is clear and moist. No oropharyngeal exudate.  Eyes: Conjunctivae and EOM are normal. Pupils are equal, round, and reactive to light. Right eye exhibits no discharge. Left eye exhibits no discharge. No scleral icterus.  Neck: Neck supple. No JVD present.  Cardiovascular: Normal rate, regular rhythm and normal heart sounds.   Pulmonary/Chest: Effort normal and breath sounds normal. No respiratory distress. She has no wheezes. She has no rales.  Neurological: She is alert. No cranial nerve deficit. Coordination normal.  Skin: Skin is warm and dry. She is not diaphoretic.  Psychiatric: She has a normal mood and affect.          Assessment & Plan:

## 2011-07-04 LAB — CBC
Hemoglobin: 13.6 g/dL (ref 12.0–15.0)
MCH: 30.8 pg (ref 26.0–34.0)
MCHC: 33 g/dL (ref 30.0–36.0)
Platelets: 201 10*3/uL (ref 150–400)
RDW: 14.2 % (ref 11.5–15.5)

## 2011-07-04 LAB — BASIC METABOLIC PANEL
Calcium: 9.7 mg/dL (ref 8.4–10.5)
Glucose, Bld: 96 mg/dL (ref 70–99)
Potassium: 4.5 mEq/L (ref 3.5–5.3)
Sodium: 142 mEq/L (ref 135–145)

## 2011-07-06 LAB — REMOTE ICD DEVICE
DEV-0020ICD: NEGATIVE
FVT: 0
PACEART VT: 0
RV LEAD IMPEDENCE ICD: 736 Ohm
TOT-0001: 0
TOT-0002: 0
TZAT-0001SLOWVT: 1
TZAT-0001SLOWVT: 2
TZAT-0002FASTVT: NEGATIVE
TZAT-0004SLOWVT: 8
TZAT-0004SLOWVT: 8
TZAT-0005SLOWVT: 81 pct
TZAT-0005SLOWVT: 91 pct
TZAT-0011SLOWVT: 10 ms
TZAT-0018FASTVT: NEGATIVE
TZAT-0019FASTVT: 8 V
TZAT-0020FASTVT: 1.5 ms
TZON-0004VSLOWVT: 20
TZST-0001FASTVT: 5
TZST-0001FASTVT: 6
TZST-0001SLOWVT: 3
TZST-0001SLOWVT: 6
TZST-0002FASTVT: NEGATIVE
TZST-0002FASTVT: NEGATIVE
TZST-0002FASTVT: NEGATIVE
TZST-0003SLOWVT: 15 J
TZST-0003SLOWVT: 35 J
VENTRICULAR PACING ICD: 16.96 pct
VF: 0

## 2011-07-07 LAB — BASIC METABOLIC PANEL
BUN: 18
BUN: 22
BUN: 28 — ABNORMAL HIGH
BUN: 30 — ABNORMAL HIGH
BUN: 32 — ABNORMAL HIGH
CO2: 26
CO2: 30
CO2: 30
CO2: 31
Calcium: 8.3 — ABNORMAL LOW
Calcium: 8.7
Calcium: 8.7
Chloride: 100
Chloride: 102
Chloride: 105
Chloride: 107
Creatinine, Ser: 1.27 — ABNORMAL HIGH
Creatinine, Ser: 1.27 — ABNORMAL HIGH
Creatinine, Ser: 1.35 — ABNORMAL HIGH
Creatinine, Ser: 1.45 — ABNORMAL HIGH
GFR calc non Af Amer: 36 — ABNORMAL LOW
GFR calc non Af Amer: 42 — ABNORMAL LOW
Glucose, Bld: 108 — ABNORMAL HIGH
Glucose, Bld: 127 — ABNORMAL HIGH
Glucose, Bld: 153 — ABNORMAL HIGH
Glucose, Bld: 81
Glucose, Bld: 84
Glucose, Bld: 86
Potassium: 3 — ABNORMAL LOW
Potassium: 3.5
Potassium: 4
Potassium: 4.4
Sodium: 141

## 2011-07-07 LAB — CBC
HCT: 42.4
MCV: 88.8
MCV: 89.8
Platelets: 226
RBC: 4.48
RDW: 14.3
WBC: 7.7
WBC: 8.2

## 2011-07-07 LAB — CARDIAC PANEL(CRET KIN+CKTOT+MB+TROPI)
CK, MB: 0.9
Relative Index: INVALID
Relative Index: INVALID
Total CK: 29
Total CK: 34
Total CK: 42
Troponin I: 0.03

## 2011-07-07 LAB — URINALYSIS, ROUTINE W REFLEX MICROSCOPIC
Bilirubin Urine: NEGATIVE
Glucose, UA: NEGATIVE
Hgb urine dipstick: NEGATIVE
Protein, ur: NEGATIVE
Urobilinogen, UA: 1

## 2011-07-07 LAB — POCT CARDIAC MARKERS
CKMB, poc: <1 — ABNORMAL LOW
Myoglobin, poc: 113
Operator id: 3206
Troponin i, poc: <0.05

## 2011-07-07 LAB — DIFFERENTIAL
Basophils Absolute: 0.1
Eosinophils Absolute: 0 — ABNORMAL LOW
Eosinophils Relative: 1
Lymphs Abs: 1.2
Neutrophils Relative %: 72

## 2011-07-07 LAB — HEPATIC FUNCTION PANEL
ALT: 14
Bilirubin, Direct: 0.1
Indirect Bilirubin: 0.7
Total Protein: 6.9

## 2011-07-07 LAB — B-NATRIURETIC PEPTIDE (CONVERTED LAB)
Pro B Natriuretic peptide (BNP): 134 — ABNORMAL HIGH
Pro B Natriuretic peptide (BNP): 358 — ABNORMAL HIGH
Pro B Natriuretic peptide (BNP): 595 — ABNORMAL HIGH

## 2011-07-07 LAB — URINE CULTURE: Colony Count: 70000

## 2011-07-07 LAB — SODIUM, URINE, RANDOM: Sodium, Ur: <10

## 2011-07-08 ENCOUNTER — Encounter: Payer: Self-pay | Admitting: *Deleted

## 2011-07-11 ENCOUNTER — Emergency Department (HOSPITAL_BASED_OUTPATIENT_CLINIC_OR_DEPARTMENT_OTHER)
Admission: EM | Admit: 2011-07-11 | Discharge: 2011-07-11 | Disposition: A | Discharge status: Home / Self Care | Payer: Medicare Other | Attending: Emergency Medicine | Admitting: Emergency Medicine

## 2011-07-11 ENCOUNTER — Other Ambulatory Visit: Payer: Self-pay

## 2011-07-11 ENCOUNTER — Encounter (HOSPITAL_BASED_OUTPATIENT_CLINIC_OR_DEPARTMENT_OTHER): Payer: Self-pay | Admitting: *Deleted

## 2011-07-11 ENCOUNTER — Emergency Department (INDEPENDENT_AMBULATORY_CARE_PROVIDER_SITE_OTHER): Payer: Medicare Other

## 2011-07-11 DIAGNOSIS — R0602 Shortness of breath: Secondary | ICD-10-CM

## 2011-07-11 DIAGNOSIS — Z95810 Presence of automatic (implantable) cardiac defibrillator: Secondary | ICD-10-CM

## 2011-07-11 DIAGNOSIS — R42 Dizziness and giddiness: Secondary | ICD-10-CM

## 2011-07-11 DIAGNOSIS — E349 Endocrine disorder, unspecified: Secondary | ICD-10-CM | POA: Insufficient documentation

## 2011-07-11 DIAGNOSIS — I517 Cardiomegaly: Secondary | ICD-10-CM

## 2011-07-11 DIAGNOSIS — I509 Heart failure, unspecified: Secondary | ICD-10-CM | POA: Insufficient documentation

## 2011-07-11 DIAGNOSIS — I6789 Other cerebrovascular disease: Secondary | ICD-10-CM

## 2011-07-11 DIAGNOSIS — Z8673 Personal history of transient ischemic attack (TIA), and cerebral infarction without residual deficits: Secondary | ICD-10-CM

## 2011-07-11 DIAGNOSIS — Z79899 Other long term (current) drug therapy: Secondary | ICD-10-CM | POA: Insufficient documentation

## 2011-07-11 DIAGNOSIS — G319 Degenerative disease of nervous system, unspecified: Secondary | ICD-10-CM

## 2011-07-11 HISTORY — DX: Disorder of kidney and ureter, unspecified: N28.9

## 2011-07-11 LAB — BASIC METABOLIC PANEL
BUN: 47 mg/dL — ABNORMAL HIGH (ref 6–23)
CO2: 26 mEq/L (ref 19–32)
Calcium: 9.7 mg/dL (ref 8.4–10.5)
Chloride: 99 mEq/L (ref 96–112)
Creatinine, Ser: 2.5 mg/dL — ABNORMAL HIGH (ref 0.50–1.10)
GFR calc Af Amer: 21 mL/min — ABNORMAL LOW (ref >90)
GFR calc non Af Amer: 19 mL/min — ABNORMAL LOW (ref >90)
Glucose, Bld: 89 mg/dL (ref 70–99)
Potassium: 4 mEq/L (ref 3.5–5.1)
Sodium: 139 mEq/L (ref 135–145)

## 2011-07-11 LAB — CARDIAC PANEL(CRET KIN+CKTOT+MB+TROPI)
CK, MB: 1.7 ng/mL (ref 0.3–4.0)
Relative Index: INVALID (ref 0.0–2.5)
Total CK: 92 U/L (ref 7–177)
Troponin I: <0.3 ng/mL (ref <0.30)

## 2011-07-11 LAB — CBC
HCT: 42.5 % (ref 36.0–46.0)
Hemoglobin: 14.3 g/dL (ref 12.0–15.0)
MCH: 30.7 pg (ref 26.0–34.0)
MCHC: 33.6 g/dL (ref 30.0–36.0)
MCV: 91.2 fL (ref 78.0–100.0)
Platelets: 183 10*3/uL (ref 150–400)
RBC: 4.66 MIL/uL (ref 3.87–5.11)
RDW: 14.1 % (ref 11.5–15.5)
WBC: 5.7 10*3/uL (ref 4.0–10.5)

## 2011-07-11 LAB — PRO B NATRIURETIC PEPTIDE: Pro B Natriuretic peptide (BNP): 2569 pg/mL — ABNORMAL HIGH (ref 0–125)

## 2011-07-11 MED ORDER — MECLIZINE HCL 25 MG PO TABS: 25.0000 mg | ORAL_TABLET | Freq: Four times a day (QID) | ORAL | Status: DC

## 2011-07-11 MED ORDER — SODIUM CHLORIDE 0.9 % IV SOLN
Freq: Once | INTRAVENOUS | Status: AC
  Administered 2011-07-11: 22:00:00 via INTRAVENOUS

## 2011-07-11 NOTE — ED Notes (Signed)
Pt c/o dizziness x1 week. Pt saw Dr. Yetta Flock on Monday and received antibiotic prescription. Pt sts dizziness is intermittent and does not bother her when sitting still.

## 2011-07-11 NOTE — ED Provider Notes (Signed)
Medical screening examination/treatment/procedure(s) were conducted as a shared visit with non-physician practitioner(s) and myself.  I personally evaluated the patient during the encounter.  10:05 PM Patient is awake and alert. She states her vertigo symptoms have improved significantly. She now just a history of renal insufficiency. Patient was advised of her CT scan findings.   Hanley Seamen, MD 07/11/11 2206

## 2011-07-11 NOTE — ED Notes (Signed)
EKG done. Pt states her skin is sore to touch. Dizziness. Denies chest pain. Pacer noted.

## 2011-07-11 NOTE — ED Provider Notes (Addendum)
History     CSN: [id number] Arrival date & time: 07/11/2011  6:27 PM  Chief Complaint  Patient presents with  . Dizziness    (Consider location/radiation/quality/duration/timing/severity/associated sxs/prior treatment) Patient is a 70 y.o. female presenting with general illness. The history is provided by a relative. No language interpreter was used.  Illness  The current episode started more than 2 weeks ago. The problem occurs continuously. The problem has been gradually worsening. The problem is severe. The symptoms are relieved by nothing. The symptoms are aggravated by nothing. Associated symptoms include cough. Pertinent negatives include no fever. She has been behaving normally. Recently, medical care has been given by the PCP. Services received include medications given.  Pt complains of feeling dizzy.  Pt reports she saw her MD and was treated with amoxilian. For otitis.  Past Medical History  Diagnosis Date  . History of ventricular tachycardia   . ICD (implantable cardiac defibrillator) in place   . DOE (dyspnea on exertion)   . Peripheral edema   . Aortic insufficiency   . 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 History of gout   . Exogenous obesity   . History of CHF (congestive heart failure)     Past Surgical History  Procedure Date  . Cardiac defibrillator placement   . Cardiac catheterization     Family History  Problem Relation Age of Onset  . Hypertension Father   . Cancer Father     History  Substance Use Topics  . Smoking status: Never Smoker   . Smokeless tobacco: Never Used  . Alcohol Use: No    OB History    Grav Para Term Preterm Abortions TAB SAB Ect Mult Living                  Review of Systems  Constitutional: Negative for fever.  Respiratory: Positive for cough.   All other systems reviewed and are negative.    Allergies  Latex and Triple antibiotic  Home Medications   Current Outpatient Rx  Name  Route Sig Dispense Refill  . ALLOPURINOL 300 MG PO TABS Oral Take 1 tablet (300 mg total) by mouth daily. 30 tablet 11  . AMLODIPINE BESYLATE 5 MG PO TABS Oral Take 5 mg by mouth daily.      . AMOXICILLIN 500 MG PO CAPS Oral Take 1 capsule (500 mg total) by mouth 3 (three) times daily. 21 capsule 0  . ASPIRIN 325 MG PO TABS Oral Take 325 mg by mouth daily.      . COLCHICINE 0.6 MG PO TABS Oral Take 0.6 mg by mouth daily.      . FUROSEMIDE 40 MG PO TABS Oral Take 40 mg by mouth daily.     [name] LOSARTAN POTASSIUM 100 MG PO TABS Oral Take 1 tablet (100 mg total) by mouth daily. 30 tablet 11  . METOPROLOL TARTRATE 50 MG PO TABS Oral Take 50 mg by mouth Twice daily.    [name] POTASSIUM CHLORIDE CRYS CR 20 MEQ PO TBCR Oral Take 1 tablet (20 mEq total) by mouth daily. 30 tablet 11  . ROSUVASTATIN CALCIUM 5 MG PO TABS Oral Take 1 tablet (5 mg total) by mouth daily. 30 tablet 11  . HYDROCOD POLST-CHLORPHEN POLST 10-8 MG/5ML PO LQCR Oral Take 5 mLs by mouth every 12 (twelve) hours as needed. For cough     . METOPROLOL SUCCINATE 25 MG PO TB24 Oral Take 25 mg by mouth  daily.      [name] NITROGLYCERIN 0.4 MG SL SUBL Sublingual Place 0.4 mg under the tongue every 5 (five) minutes as needed.        BP 145/70  Pulse 96  Temp(Src) 98 F (36.7 C) (Oral)  Resp 20  Ht 5\' 2"  (1.575 m)  Wt 195 lb (88.451 kg)  BMI 35.67 kg/m2  SpO2 98%  Physical Exam  Nursing note and vitals reviewed. Constitutional: She is oriented to person, place, and time. She appears well-developed and well-nourished.  HENT:  Head: Normocephalic and atraumatic.  Right Ear: External ear normal.  Left Ear: External ear normal.  Nose: Nose normal.  Mouth/Throat: Oropharynx is clear and moist.  Eyes: Conjunctivae and EOM are normal. Pupils are equal, round, and reactive to light.  Neck: Normal range of motion. Neck supple.  Cardiovascular: Normal rate and regular rhythm.   Pulmonary/Chest: Effort normal.  Abdominal: Soft.  Musculoskeletal:  Normal range of motion.  Neurological: She is alert and oriented to person, place, and time. She has normal reflexes.  Skin: Skin is warm.  Psychiatric: She has a normal mood and affect.    ED Course  Procedures (including critical care time)  Labs Reviewed  BASIC METABOLIC PANEL - Abnormal; Notable for the following:    BUN 47 (*)    Creatinine, Ser 2.50 (*)    GFR calc non Af Amer 19 (*)    GFR calc Af Amer 21 (*)    All other components within normal limits  CARDIAC PANEL(CRET KIN+CKTOT+MB+TROPI)  CBC   Dg Chest 2 View  07/11/2011  *RADIOLOGY REPORT*  Clinical Data: Dizziness, shortness of breath, discomfort near pacemaker site, history CHF, atrial fibrillation, hypertension  CHEST - 2 VIEW  Comparison: 12/23/2010  Findings: Left subclavian AICD, lead tip projecting over right ventricle. Enlargement of cardiac silhouette. Pulmonary vascular congestion. Calcified tortuous aorta. No acute failure or consolidation. No pleural effusion or pneumothorax. Bones demineralized.  IMPRESSION: Enlargement of cardiac silhouette post AICD. No acute abnormalities.  Original Report Authenticated By: Lollie Marrow, M.D.     No diagnosis found.    MDM   Results for orders placed during the hospital encounter of 07/11/11  CARDIAC PANEL(CRET KIN+CKTOT+MB+TROPI)      Component Value Range   Total CK 92  7 - 177 (U/L)   CK, MB 1.7  0.3 - 4.0 (ng/mL)   Troponin I <0.30  <0.30 (ng/mL)   Relative Index RELATIVE INDEX IS INVALID  0.0 - 2.5   CBC      Component Value Range   WBC 5.7  4.0 - 10.5 (K/uL)   RBC 4.66  3.87 - 5.11 (MIL/uL)   Hemoglobin 14.3  12.0 - 15.0 (g/dL)   HCT 40.9  81.1 - 91.4 (%)   MCV 91.2  78.0 - 100.0 (fL)   MCH 30.7  26.0 - 34.0 (pg)   MCHC 33.6  30.0 - 36.0 (g/dL)   RDW 78.2  95.6 - 21.3 (%)   Platelets 183  150 - 400 (K/uL)  BASIC METABOLIC PANEL      Component Value Range   Sodium 139  135 - 145 (mEq/L)   Potassium 4.0  3.5 - 5.1 (mEq/L)   Chloride 99  96 - 112  (mEq/L)   CO2 26  19 - 32 (mEq/L)   Glucose, Bld 89  70 - 99 (mg/dL)   BUN 47 (*) 6 - 23 (mg/dL)   Creatinine, Ser 0.86 (*) 0.50 - 1.10 (mg/dL)  Calcium 9.7  8.4 - 10.5 (mg/dL)   GFR calc non Af Amer 19 (*) >90 (mL/min)   GFR calc Af Amer 21 (*) >90 (mL/min)  PRO B NATRIURETIC PEPTIDE      Component Value Range   BNP, POC 2569.0 (*) 0 - 125 (pg/mL)   Dg Chest 2 View  07/11/2011  *RADIOLOGY REPORT*  Clinical Data: Dizziness, shortness of breath, discomfort near pacemaker site, history CHF, atrial fibrillation, hypertension  CHEST - 2 VIEW  Comparison: 12/23/2010  Findings: Left subclavian AICD, lead tip projecting over right ventricle. Enlargement of cardiac silhouette. Pulmonary vascular congestion. Calcified tortuous aorta. No acute failure or consolidation. No pleural effusion or pneumothorax. Bones demineralized.  IMPRESSION: Enlargement of cardiac silhouette post AICD. No acute abnormalities.  Original Report Authenticated By: Lollie Marrow, M.D.    Date: 07/11/2011  Rate: 88  Rhythm: atrial fibrillation  QRS Axis: normal  Intervals: normal  ST/T Wave abnormalities: nonspecific ST changes  Conduction Disutrbances:none  Narrative Interpretation:   Old EKG Reviewed: unchanged   Pt counseled on her results.  I advised her to see her MD on Monday for recheck.  I will add Antivert to treatment       Langston Masker, [place] 07/12/11 1700  Langston Masker, [place] 07/12/11 1701  Langston Masker, [place] 07/17/11 1529

## 2011-07-14 ENCOUNTER — Ambulatory Visit (INDEPENDENT_AMBULATORY_CARE_PROVIDER_SITE_OTHER): Payer: Medicare Other | Admitting: Family

## 2011-07-14 ENCOUNTER — Encounter: Payer: Self-pay | Admitting: Family

## 2011-07-14 ENCOUNTER — Telehealth: Payer: Self-pay | Admitting: Family

## 2011-07-14 ENCOUNTER — Telehealth: Payer: Self-pay | Admitting: *Deleted

## 2011-07-14 DIAGNOSIS — R42 Dizziness and giddiness: Secondary | ICD-10-CM

## 2011-07-14 DIAGNOSIS — M791 Myalgia, unspecified site: Secondary | ICD-10-CM | POA: Insufficient documentation

## 2011-07-14 DIAGNOSIS — N189 Chronic kidney disease, unspecified: Secondary | ICD-10-CM

## 2011-07-14 DIAGNOSIS — IMO0001 Reserved for inherently not codable concepts without codable children: Secondary | ICD-10-CM

## 2011-07-14 DIAGNOSIS — N289 Disorder of kidney and ureter, unspecified: Secondary | ICD-10-CM

## 2011-07-14 NOTE — Telephone Encounter (Signed)
She should hold losartan 100 until she sees Dr. Rodena Medin in 2 weeks. He will advise her if she is to restart the medications.

## 2011-07-14 NOTE — Assessment & Plan Note (Signed)
No significant improvement in her dizziness despite treatment for ear effusion or use of meclizine.  Unfortunately, we were unable to obtain an accurate blood pressure today due to extreme pt discomfort.  I was able to auscultate a diastolic reading in the 40's.  Her recent increase in BNP to >2000 along with her recent deterioration in her renal function suggests to me that her dizziness may be cardiac in nature.  She has not had an echo since 2011.  She is followed [facility].   I was able to get the patient scheduled for an appointment tomorrow with Dr. Patty Sermons.  Will defer referral back to EP to Dr. Patty Sermons.

## 2011-07-14 NOTE — Telephone Encounter (Signed)
Received voice message from pt's husband stating pt takes Losartan 100mg  once a day. Please advise.

## 2011-07-14 NOTE — Progress Notes (Signed)
Subjective:    Patient ID: [name], female    DOB: 1941-07-25, 70 y.o.   MRN: [id number]  HPI  [name] is a 70 yr old female who presents today for ER follow up.  She was originally evaluated by Dr. Rodena Medin on 10/4 with chief complaint of dizziness and was treated with amoxicillin for an ear effusion. She reports that her dizziness is unchanged.  She reports that her dizziness is intermittent in nature and can happen at rest or with walking.  She reports that she did have a fall about 2-3 weeks ago.  Since that time she has had generalized muscle tenderness/soreness.    Dizziness- She presented to the ED over the weekend due to dizziness.  ED records are reviewed today. 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 She had a negative set of cardiac enzymes and a BNP>2500.  (BNP was 700 last check).  She was given a prescription for antivert in the ED which she has been using without significant improvement in her symptoms.   Acute on chronic renal insufficiency-  Noted to have Creatinine in the ED of 2.5 which was up from 1.7 last visit.  She specifically denies use of NSAIDS.      Review of Systems  Respiratory: Positive for shortness of breath.        + DOE.   She sleeps on one pillow.  Denies problems with swelling in her feet at present.     Past Medical History  Diagnosis Date  . History of ventricular tachycardia   . ICD (implantable cardiac defibrillator) in place   . DOE (dyspnea on exertion)   . Peripheral edema   . Aortic insufficiency   . 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 History of gout   . Exogenous obesity   . History of CHF (congestive heart failure)   . Renal insufficiency     History   Social History  . Marital Status: Divorced    Spouse Name: N/A   Number of Children: N/A  . Years of Education: N/A   Occupational History  . Not on file.   Social History Main Topics  . Smoking status: Never Smoker   . Smokeless tobacco: Never Used  . Alcohol Use: No  . Drug Use: No  . Sexually Active: Not on file   Other Topics Concern  . Not on file   Social History Narrative  . No narrative on file    Past Surgical History  Procedure Date  . Cardiac defibrillator placement   . Cardiac catheterization     Family History  Problem Relation Age of Onset  . Hypertension Father   . Cancer Father     Allergies  Allergen Reactions  . Latex Rash  . Triple Antibiotic Rash    Current Outpatient Prescriptions on File Prior to Visit  Medication Sig Dispense Refill  . allopurinol (ZYLOPRIM) 300 MG tablet Take 1 tablet (300 mg total) by mouth daily.  30 tablet  11  . amLODipine (NORVASC) 5 MG tablet Take 5 mg by mouth daily.        [name] aspirin 325 MG tablet Take 325 mg by mouth daily.        . colchicine 0.6 MG tablet Take 0.6 mg by mouth  daily.        . furosemide (LASIX) 40 MG tablet Take 1 1/2 tablets daily       . metoprolol succinate (TOPROL-XL) 25 MG 24 hr tablet Take 25 mg by mouth daily.        . nitroGLYCERIN (NITROSTAT) 0.4 MG SL tablet Place 0.4 mg under the tongue every 5 (five) minutes as needed.        . potassium chloride SA (K-DUR,KLOR-CON) 20 MEQ tablet Take 1 tablet (20 mEq total) by mouth daily.  30 tablet  11  . rosuvastatin (CRESTOR) 5 MG tablet Take 1 tablet (5 mg total) by mouth daily.  30 tablet  11  . amoxicillin (AMOXIL) 500 MG capsule Take 1 capsule (500 mg total) by mouth 3 (three) times daily.  21 capsule  0  . losartan (COZAAR) 100 MG tablet Take 1 tablet (100 mg total) by mouth daily.  30 tablet  11  . meclizine (ANTIVERT) 25 MG tablet Take 1 tablet (25 mg total) by mouth 4 (four) times daily.  28 tablet  0    Pulse 66  Temp(Src) 97.5 F (36.4 C) (Oral)  Resp 16  Ht 5\' 2"  (1.575 m)  Wt 192 lb 1.3 oz  (87.127 kg)  BMI 35.13 kg/m2  SpO2 96%       Objective:   Physical Exam  Constitutional: She appears well-developed and well-nourished.  HENT:  Head: Normocephalic and atraumatic.  Right Ear: Tympanic membrane and ear canal normal.  Left Ear: Tympanic membrane and ear canal normal.  Eyes: No scleral icterus.  Neck: Neck supple.  Cardiovascular: Normal rate.   Murmur heard.      Irregular rate/rythm  Pulmonary/Chest: Effort normal and breath sounds normal. No respiratory distress. She has no wheezes. She has no rales. She exhibits no tenderness.  Abdominal: Soft. She exhibits no distension.  Musculoskeletal:       Trace bilateral LE edema is noted.    Skin: Skin is warm and dry.  Psychiatric: She has a normal mood and affect. Her behavior is normal. Judgment and thought content normal.          Assessment & Plan:

## 2011-07-14 NOTE — Telephone Encounter (Signed)
Notified pt's husband and he voices understanding. 

## 2011-07-14 NOTE — Progress Notes (Signed)
icd remote w/icm  

## 2011-07-14 NOTE — Assessment & Plan Note (Addendum)
I believe that this is related to the patient's recent fall.  She had a normal CK level 3 days ago in the ED.

## 2011-07-14 NOTE — Patient Instructions (Signed)
You will be contacted about your appointment with Dr. Patty Sermons. Follow up with Dr. Rodena Medin in 2 weeks. Call if you develop shortness of breath worsening dizziness or leg swelling.

## 2011-07-14 NOTE — Telephone Encounter (Signed)
Spoke with husband, (he told me pt is sleeping) notified him re: apt tomorrow with Dr. Patty Sermons at 9:30 AM at [facility].

## 2011-07-14 NOTE — Assessment & Plan Note (Signed)
Again, I suspect that CHF is a contributing factor to her worsening renal function.  I am hesitant to hold her lasix as I am afraid she may decompensate from a volume/CHF standpoint. Await cardiology recommendations.  Also, the pt was not sure if she was actually taking cozaar or not.  She will contact us and let us know. If she is taking cozaar I will instruct her to hold this medication.  She is to follow up with Dr. Rodena Medin in 2 weeks.

## 2011-07-15 ENCOUNTER — Encounter: Payer: Self-pay | Admitting: Cardiology

## 2011-07-15 ENCOUNTER — Ambulatory Visit (INDEPENDENT_AMBULATORY_CARE_PROVIDER_SITE_OTHER): Payer: Medicare Other | Admitting: Cardiology

## 2011-07-15 VITALS — BP 100/50 | HR 72 | Wt 193.0 lb

## 2011-07-15 DIAGNOSIS — E876 Hypokalemia: Secondary | ICD-10-CM

## 2011-07-15 DIAGNOSIS — M2550 Pain in unspecified joint: Secondary | ICD-10-CM

## 2011-07-15 DIAGNOSIS — I5022 Chronic systolic (congestive) heart failure: Secondary | ICD-10-CM

## 2011-07-15 DIAGNOSIS — I351 Nonrheumatic aortic (valve) insufficiency: Secondary | ICD-10-CM

## 2011-07-15 DIAGNOSIS — R5383 Other fatigue: Secondary | ICD-10-CM | POA: Insufficient documentation

## 2011-07-15 DIAGNOSIS — I509 Heart failure, unspecified: Secondary | ICD-10-CM

## 2011-07-15 DIAGNOSIS — I4891 Unspecified atrial fibrillation: Secondary | ICD-10-CM

## 2011-07-15 DIAGNOSIS — I359 Nonrheumatic aortic valve disorder, unspecified: Secondary | ICD-10-CM

## 2011-07-15 DIAGNOSIS — E785 Hyperlipidemia, unspecified: Secondary | ICD-10-CM

## 2011-07-15 DIAGNOSIS — R42 Dizziness and giddiness: Secondary | ICD-10-CM

## 2011-07-15 DIAGNOSIS — I119 Hypertensive heart disease without heart failure: Secondary | ICD-10-CM

## 2011-07-15 MED ORDER — POTASSIUM CHLORIDE CRYS ER 20 MEQ PO TBCR: 20.0000 meq | EXTENDED_RELEASE_TABLET | Freq: Every day | ORAL | Status: DC

## 2011-07-15 MED ORDER — NITROGLYCERIN 0.4 MG SL SUBL: 0.4000 mg | SUBLINGUAL_TABLET | SUBLINGUAL | Status: DC | PRN

## 2011-07-15 MED ORDER — METOPROLOL SUCCINATE ER 25 MG PO TB24: 25.0000 mg | ORAL_TABLET | Freq: Every day | ORAL | Status: DC

## 2011-07-15 MED ORDER — MECLIZINE HCL 25 MG PO TABS: 25.0000 mg | ORAL_TABLET | Freq: Three times a day (TID) | ORAL | Status: AC | PRN

## 2011-07-15 MED ORDER — ALLOPURINOL 100 MG PO TABS: 100.0000 mg | ORAL_TABLET | Freq: Every day | ORAL | Status: DC

## 2011-07-15 MED ORDER — FUROSEMIDE 40 MG PO TABS: 40.0000 mg | ORAL_TABLET | Freq: Every day | ORAL | Status: DC

## 2011-07-15 MED ORDER — AMLODIPINE BESYLATE 5 MG PO TABS: 5.0000 mg | ORAL_TABLET | Freq: Every day | ORAL | Status: DC

## 2011-07-15 NOTE — Assessment & Plan Note (Signed)
The patient has not felt well for probably several months.  2 weeks ago.  She saw Dr. Irving Burton in because of dizziness.  It was felt that she might have a inner ear infection and she was given an antibiotic and also meclizine.  Last week.  She went to [facility], because she was feeling worse.  She had a CT of the head, which did not show any acute findings.  There was no evidence of subdural hematoma.  She had fell and against her right flank.  Previous to that.  Her chest x-ray showed cardiomegaly, but no frank congestive heart failure.  Yesterday she saw Dr. Peggyann Juba.  She returns today for followup here.  She complains of being sore in her muscles all over.  She complains of being weak.  She has difficulty getting from the chair up to the examining table without help.

## 2011-07-15 NOTE — Assessment & Plan Note (Signed)
The patient remains in atrial fibrillation.  She has a slow ventricular response.  EKG today shows occasional ventricular paced beats.  She has not had any TIA symptoms.  She is not on Coumadin because of previous intracerebral hemorrhage

## 2011-07-15 NOTE — Assessment & Plan Note (Signed)
The patient has chronic exertional dyspnea.  She has not been experiencing any acute paroxysmal nocturnal dyspnea.  She has not had any recent peripheral edema, and she appears to be dry.

## 2011-07-15 NOTE — Patient Instructions (Signed)
Discontinue Colchicine, Losartan, and Crestor Decrease your Metoprolol to 25 mg daily, Allopurinol to 100 mg daily, and only use your Meclizine as needed for dizziness Keep your scheduled appointment

## 2011-07-15 NOTE — Progress Notes (Signed)
[name] Date of Birth:  11/26/1940 [facility] Card[facility] / [facility] [address]   [address] [place], [place]  [address] [phone]           Fax   [phone]  History of Present Illness: This pleasant 70 year old, Caucasian female is seen for a work in office visit.  She has a complex past medical history.  She has a history of ventricular tachycardia and has an ICD in place.  She has a history of aortic insufficiency.  She is in chronic atrophic fibrillation.  She has a history of hypertensive cardiovascular disease with LVH.  She has a remote history of DVT.  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  He does not have coronary disease.  She had a cardiac catheterization in 2002, which did not show any coronary disease.  Her past history of gout.  She had an echocardiogram in January 2011, showing an ejection fraction of 45-50% with moderate aortic stenosis and marked aortic insufficiency, as well as mild mitral regurgitation, and mild tricuspid regurgitation and moderate pulmonary hypertension with a pulmonary artery pressure of 52.  Current Outpatient Prescriptions  Medication Sig Dispense Refill  . amLODipine (NORVASC) 5 MG tablet Take 1 tablet (5 mg total) by mouth daily.  90 tablet  3  . aspirin 325 MG tablet Take 325 mg by mouth daily.        . bisacodyl (DULCOLAX) 5 MG EC tablet Take 5 mg by mouth daily as needed.        . furosemide (LASIX) 40 MG tablet Take 1 tablet (40 mg total) by mouth daily.  90 tablet  3  . meclizine (ANTIVERT) 25 MG tablet Take 1 tablet (25 mg total) by mouth 3 (three) times daily as needed.  30 tablet  prn  . metoprolol succinate (TOPROL-XL) 25 MG 24 hr tablet Take 1 tablet (25 mg total) by mouth daily.  90 tablet  3  . nitroGLYCERIN (NITROSTAT) 0.4 MG SL tablet Place 1 tablet (0.4 mg total) under the tongue every 5 (five) minutes as needed.  25 tablet   prn  . potassium chloride SA (K-DUR,KLOR-CON) 20 MEQ tablet Take 1 tablet (20 mEq total) by mouth daily.  90 tablet  3  . allopurinol (ZYLOPRIM) 100 MG tablet Take 1 tablet (100 mg total) by mouth daily.  90 tablet  3    Allergies  Allergen Reactions  . Latex Rash  . Triple Antibiotic Rash    Patient Active Problem List  Diagnoses  . HYPERLIPIDEMIA  . GOUT  . HYPERKALEMIA  . HYPERTENSION  . Atrial fibrillation  . CONGESTIVE HEART FAILURE  . CVA  . VENOUS INSUFFICIENCY  . BACK PAIN, LUMBAR  . FATIGUE  . MEMORY LOSS  . EDEMA  . FOOT PAIN, BILATERAL  . COMBINED HEART FAILURE, ACUTE ON CHRONIC  . SHORTNESS OF BREATH  . ALKALINE PHOSPHATASE, ELEVATED  . Aortic insufficiency and aortic stenosis  . Dizziness  . Muscle pain  . Acute on chronic renal insufficiency    History  Smoking status  . Never Smoker   Smokeless tobacco  . Never Used    History  Alcohol Use No    Family History  Problem Relation Age of Onset  . Hypertension Father   . Cancer Father     Review of Systems: Constitutional: no fever chills diaphoresis or fatigue or change in  weight.  Head and neck: no hearing loss, no epistaxis, no photophobia or visual disturbance. Respiratory: No cough, shortness of breath or wheezing. Cardiovascular: No chest pain peripheral edema, palpitations. Gastrointestinal: No abdominal distention, no abdominal pain, no change in bowel habits hematochezia or melena. Genitourinary: No dysuria, no frequency, no urgency, no nocturia. Musculoskeletal:No arthralgias, no back pain, no gait disturbance or myalgias. Neurological: No dizziness, no headaches, no numbness, no seizures, no syncope, no weakness, no tremors. Hematologic: No lymphadenopathy, no easy bruising. Psychiatric: No confusion, no hallucinations, no sleep disturbance.    Physical Exam: Filed Vitals:   07/15/11 1006  BP: 100/50  Pulse: 72   general appearance reveals elderly, weak woman in no acute  distress.Pupils equal and reactive.   Extraocular Movements are full.  There is no scleral icterus.  The mouth and pharynx are normal.  The neck is supple.  The carotids reveal no bruits.  The jugular venous pressure is normal.  The thyroid is not enlarged.  There is no lymphadenopathy.  There is no nystagmusThe chest is clear to percussion and auscultation. There are no rales or rhonchi. Expansion of the chest is symmetrical.  Heart reveals a grade 2/6 systolic ejection murmur at the base and a grade 1/6 diastolic murmur of aortic insufficiency.  At the left sternal edge.The abdomen is soft and nontender. Bowel sounds are normal. The liver and spleen are not enlarged. There Are no abdominal masses. There are no bruits.  The pedal pulses are good.  There is no phlebitis or edema.  There is no cyanosis or clubbing. Neurologic exam reveals generalized weakness.  Also, the patient is sore to touch on her muscles, as well as on her trunk, and chest.  It is very painful for her when we blew up the blood pressure cuff  EKG today shows atrial fibrillation with a slow ventricular response and occasional paced beats.  She has a pattern of left ventricular hypertrophy with strain.   Assessment / Plan:  We reviewed in detail.  Her recent blood work was obtained at [facility].  He has renal insufficiency.  Today, her blood pressure is low, and she appears to be volume depleted.  She is also bradycardiac from her beta blocker.  We have reduced several of her medicines today.  We are stopping her colchicine.  We are also stopping her Crestor because of her myalgias.  He does have renal, insufficiency.  We're temporarily stopping her losartan.  We are decreasing her metoprolol to 25 mg daily to allow a higher heart rate.  We are adjusting her allopurinol to 100 mg a day because of her renal insufficiency.  She had been taking meclizine around-the-clock, but should use it only when necessary for  dizziness.  She'll be rechecked at her regular visit in December or sooner when necessary

## 2011-07-16 ENCOUNTER — Telehealth: Payer: Self-pay | Admitting: Cardiology

## 2011-07-16 NOTE — Telephone Encounter (Signed)
At visit yesterday pt had some medication changes and when she got home and went to pick up meds one of the medications she was told to stop taking was in her pile so her grand daughter wants to talk to a nurse to get clarification

## 2011-07-16 NOTE — Telephone Encounter (Signed)
Spoke with [name] and there was a question on Amlodipine and if patient should be taking or not.  Reviewed dictation from office visit yesterday, appears that patient should continue Amlodipine. If systolic blood pressure continues around 100, call back

## 2011-07-17 NOTE — ED Provider Notes (Signed)
Medical screening examination/treatment/procedure(s) were conducted as a shared visit with non-physician practitioner(s) and myself.  I personally evaluated the patient during the encounter   Hanley Seamen, MD 07/17/11 [phone]

## 2011-07-18 ENCOUNTER — Telehealth: Payer: Self-pay | Admitting: Cardiology

## 2011-07-18 NOTE — Telephone Encounter (Signed)
Return office visit with Dr. Graciela Husbands in February, patient aware.

## 2011-07-18 NOTE — Telephone Encounter (Signed)
Pt needs to know when to have her device checked again.  Please call her with that info

## 2011-07-28 ENCOUNTER — Ambulatory Visit (INDEPENDENT_AMBULATORY_CARE_PROVIDER_SITE_OTHER): Payer: Medicare Other | Admitting: Family

## 2011-07-28 ENCOUNTER — Encounter: Payer: Self-pay | Admitting: Family

## 2011-07-28 VITALS — BP 138/50 | HR 66 | Temp 97.4°F | Resp 16 | Ht 62.0 in | Wt 192.0 lb

## 2011-07-28 DIAGNOSIS — IMO0001 Reserved for inherently not codable concepts without codable children: Secondary | ICD-10-CM

## 2011-07-28 DIAGNOSIS — N289 Disorder of kidney and ureter, unspecified: Secondary | ICD-10-CM

## 2011-07-28 DIAGNOSIS — M791 Myalgia, unspecified site: Secondary | ICD-10-CM

## 2011-07-28 DIAGNOSIS — I509 Heart failure, unspecified: Secondary | ICD-10-CM

## 2011-07-28 DIAGNOSIS — R42 Dizziness and giddiness: Secondary | ICD-10-CM

## 2011-07-28 DIAGNOSIS — N189 Chronic kidney disease, unspecified: Secondary | ICD-10-CM

## 2011-07-28 LAB — BASIC METABOLIC PANEL
Calcium: 9.6 mg/dL (ref 8.4–10.5)
Creat: 1.46 mg/dL — ABNORMAL HIGH (ref 0.50–1.10)

## 2011-07-28 NOTE — Assessment & Plan Note (Signed)
Will repeat BMET today.  She is off of her ARB.  If her creatinine remains significantly elevated, will plan referral to nephrology.

## 2011-07-28 NOTE — Progress Notes (Signed)
Subjective:    Patient ID: [name], female    DOB: 10/20/1940, 70 y.o.   MRN: [id number]  HPI  [name] is a 70 yr old female who presents today for follow up.   1) Dizziness- Last visit she presented with chief complaint of dizziness.  This was following a fall.  Her BNP at that time was >2500. She notes that for 3 weeks she had frequent dizziness.  It is nearly resolved at this point.  She reports only occasional dizziness with standing.  2) CHF-  She notes some occasional chest wall tenderness (She has an ICD). Denies lower extremity edema.  She notes occasional sob with exertion.  Denies palpitations.   3) Muscle pain- impoved.  Dr. Patty Sermons discontinued her statin.   4) Renal insufficiency- her ARB has been discontinued.  She tells me that she does not have a nephrologist.    Review of Systems See HPI  Past Medical History  Diagnosis Date  . History of ventricular tachycardia   . ICD (implantable cardiac defibrillator) in place   . DOE (dyspnea on exertion)   . Peripheral edema   . Aortic insufficiency   . 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 History of gout   . Exogenous obesity   . History of CHF (congestive heart failure)   . Renal insufficiency     History   Social History  . Marital Status: Divorced    Spouse Name: N/A    Number of Children: N/A  . Years of Education: N/A   Occupational History  . Not on file.   Social History Main Topics  . Smoking status: Never Smoker   . Smokeless tobacco: Never Used  . Alcohol Use: No  . Drug Use: No  . Sexually Active: Not on file   Other Topics Concern  . Not on file   Social History Narrative  . No narrative on file    Past Surgical History  Procedure Date  . Cardiac defibrillator placement   . Cardiac catheterization     Family History  Problem Relation Age of Onset  . Hypertension Father   . Cancer Father     Allergies  Allergen Reactions  . Latex Rash  .  Triple Antibiotic Rash    Current Outpatient Prescriptions on File Prior to Visit  Medication Sig Dispense Refill  . allopurinol (ZYLOPRIM) 100 MG tablet Take 1 tablet (100 mg total) by mouth daily.  90 tablet  3  . amLODipine (NORVASC) 5 MG tablet Take 1 tablet (5 mg total) by mouth daily.  90 tablet  3  . aspirin 325 MG tablet Take 325 mg by mouth daily.        . bisacodyl (DULCOLAX) 5 MG EC tablet Take 5 mg by mouth daily as needed.        . furosemide (LASIX) 40 MG tablet Take 1 tablet (40 mg total) by mouth daily.  90 tablet  3  . metoprolol succinate (TOPROL-XL) 25 MG 24 hr tablet Take 1 tablet (25 mg total) by mouth daily.  90 tablet  3  . nitroGLYCERIN (NITROSTAT) 0.4 MG SL tablet Place 1 tablet (0.4 mg total) under the tongue every 5 (five) minutes as needed.  25 tablet  prn  . potassium chloride SA (K-DUR,KLOR-CON) 20 MEQ tablet Take 1 tablet (20 mEq total) by mouth daily.  90 tablet  3    BP 138/50  Pulse 66  Temp(Src)  97.4 F (36.3 C) (Oral)  Resp 16  Ht 5\' 2"  (1.575 m)  Wt 192 lb 0.6 oz (87.109 kg)  BMI 35.12 kg/m2       Objective:   Physical Exam  Constitutional: She appears well-developed and well-nourished. No distress.  Eyes: Conjunctivae are normal. No scleral icterus.  Cardiovascular: Normal rate and regular rhythm.   No murmur heard. Pulmonary/Chest: Effort normal and breath sounds normal. No respiratory distress. She has no wheezes. She has no rales. She exhibits no tenderness.  Musculoskeletal: She exhibits no edema.  Psychiatric: She has a normal mood and affect. Her behavior is normal. Judgment and thought content normal.          Assessment & Plan:

## 2011-07-28 NOTE — Assessment & Plan Note (Signed)
This is nearly resolved. Monitor.

## 2011-07-28 NOTE — Patient Instructions (Signed)
Please schedule a follow up appointment in 2 months.  Complete your blood work prior to leaving today.

## 2011-07-28 NOTE — Assessment & Plan Note (Signed)
Clinically euvolemic at this point.  On Lasix. Weight is stable. She saw cardiology 2 weeks ago.

## 2011-07-28 NOTE — Assessment & Plan Note (Signed)
This is resolved.  Statin is on hold per cardiology.  I suspect that her pain was due to her fall.

## 2011-07-29 ENCOUNTER — Encounter: Payer: Self-pay | Admitting: Family

## 2011-09-01 ENCOUNTER — Ambulatory Visit (INDEPENDENT_AMBULATORY_CARE_PROVIDER_SITE_OTHER): Payer: Medicare Other | Admitting: Cardiology

## 2011-09-01 ENCOUNTER — Encounter: Payer: Self-pay | Admitting: Cardiology

## 2011-09-01 ENCOUNTER — Other Ambulatory Visit (INDEPENDENT_AMBULATORY_CARE_PROVIDER_SITE_OTHER): Payer: Medicare Other | Admitting: *Deleted

## 2011-09-01 ENCOUNTER — Other Ambulatory Visit: Payer: Self-pay | Admitting: Cardiology

## 2011-09-01 ENCOUNTER — Other Ambulatory Visit: Payer: Self-pay | Admitting: *Deleted

## 2011-09-01 VITALS — BP 128/78 | HR 70 | Ht 62.0 in | Wt 192.0 lb

## 2011-09-01 DIAGNOSIS — I872 Venous insufficiency (chronic) (peripheral): Secondary | ICD-10-CM

## 2011-09-01 DIAGNOSIS — I4891 Unspecified atrial fibrillation: Secondary | ICD-10-CM

## 2011-09-01 DIAGNOSIS — I119 Hypertensive heart disease without heart failure: Secondary | ICD-10-CM

## 2011-09-01 DIAGNOSIS — R5383 Other fatigue: Secondary | ICD-10-CM

## 2011-09-01 DIAGNOSIS — E875 Hyperkalemia: Secondary | ICD-10-CM

## 2011-09-01 DIAGNOSIS — M109 Gout, unspecified: Secondary | ICD-10-CM

## 2011-09-01 DIAGNOSIS — I359 Nonrheumatic aortic valve disorder, unspecified: Secondary | ICD-10-CM

## 2011-09-01 DIAGNOSIS — I352 Nonrheumatic aortic (valve) stenosis with insufficiency: Secondary | ICD-10-CM

## 2011-09-01 LAB — LIPID PANEL
Total CHOL/HDL Ratio: 4
Triglycerides: 95 mg/dL (ref 0.0–149.0)

## 2011-09-01 LAB — HEPATIC FUNCTION PANEL
Albumin: 4.3 g/dL (ref 3.5–5.2)
Alkaline Phosphatase: 101 U/L (ref 39–117)
Bilirubin, Direct: 0.1 mg/dL (ref 0.0–0.3)
Total Bilirubin: 0.6 mg/dL (ref 0.3–1.2)

## 2011-09-01 LAB — URIC ACID: Uric Acid, Serum: 9.2 mg/dL — ABNORMAL HIGH (ref 2.4–7.0)

## 2011-09-01 LAB — BASIC METABOLIC PANEL
BUN: 28 mg/dL — ABNORMAL HIGH (ref 6–23)
CO2: 23 mEq/L (ref 19–32)
Chloride: 107 mEq/L (ref 96–112)
Creatinine, Ser: 1.4 mg/dL — ABNORMAL HIGH (ref 0.4–1.2)

## 2011-09-01 NOTE — Assessment & Plan Note (Signed)
The patient has a history of prior deep vein thrombosis of her legs and has an inferior vena cava if Greenfield filter in place.

## 2011-09-01 NOTE — Patient Instructions (Addendum)
Your physician recommends that you continue on your current medications as directed. Please refer to the Current Medication list given to you today. Your physician recommends that you schedule a follow-up appointment in: 3 months with EKG   

## 2011-09-01 NOTE — Assessment & Plan Note (Signed)
Work today is pending.  She does have a history of renal insufficiency and a tendency toward hyperkalemia.

## 2011-09-01 NOTE — Assessment & Plan Note (Signed)
The patient is in chronic atrial fibrillation.  However she is not on Coumadin because of prior intracerebral hemorrhage.  She has not had any TIA symptoms.  She is on an aspirin daily

## 2011-09-01 NOTE — Progress Notes (Signed)
[name] Date of Birth:  1941-01-19 [facility] / [facility] [address]   [address] [place], [place]  [address] [phone]           Fax   [phone]  History of Present Illness: This pleasant 70 year old woman is seen for a scheduled two-month followup office visit.  We last saw her on 07/15/11 at which time she was experiencing symptoms from low blood pressure and volume depletion and bradycardia.  She was also having diffuse myalgias.  On that visit we cut back on her beta blocker, stopped her colchicine, stopped her Crestor because of myalgias, and reduce the dose of her metoprolol to 25 mg daily to allow higher heart rate.  We also held her losartan temporarily.  We adjusted her allopurinol down to 100 mg daily because of renal insufficiency.  She reports that since last visit she has been doing better.  She is less somnolent and her energy level has improved and she is not experiencing dizziness or syncope.  She denies any exacerbation of congestive heart failure and she sleeps on one pillow.  Current Outpatient Prescriptions  Medication Sig Dispense Refill  . allopurinol (ZYLOPRIM) 100 MG tablet Take 1 tablet (100 mg total) by mouth daily.  90 tablet  3  . amLODipine (NORVASC) 5 MG tablet Take 1 tablet (5 mg total) by mouth daily.  90 tablet  3  . aspirin 325 MG tablet Take 325 mg by mouth daily.        . bisacodyl (DULCOLAX) 5 MG EC tablet Take 5 mg by mouth daily as needed.        . furosemide (LASIX) 40 MG tablet Take 1 tablet (40 mg total) by mouth daily.  90 tablet  3  . metoprolol succinate (TOPROL-XL) 25 MG 24 hr tablet Take 1 tablet (25 mg total) by mouth daily.  90 tablet  3  . nitroGLYCERIN (NITROSTAT) 0.4 MG SL tablet Place 1 tablet (0.4 mg total) under the tongue every 5 (five) minutes as needed.  25 tablet  prn  . potassium chloride SA (K-DUR,KLOR-CON) 20 MEQ tablet Take 1 tablet (20 mEq total) by mouth daily.  90 tablet  3    Allergies    Allergen Reactions  . Latex Rash  . Triple Antibiotic Rash    Patient Active Problem List  Diagnoses  . HYPERLIPIDEMIA  . GOUT  . HYPERKALEMIA  . HYPERTENSION  . Atrial fibrillation  . CONGESTIVE HEART FAILURE  . CVA  . VENOUS INSUFFICIENCY  . BACK PAIN, LUMBAR  . FATIGUE  . MEMORY LOSS  . EDEMA  . FOOT PAIN, BILATERAL  . COMBINED HEART FAILURE, ACUTE ON CHRONIC  . SHORTNESS OF BREATH  . ALKALINE PHOSPHATASE, ELEVATED  . Aortic insufficiency and aortic stenosis  . Dizziness  . Muscle pain  . Acute on chronic renal insufficiency  . Malaise and fatigue    History  Smoking status  . Never Smoker   Smokeless tobacco  . Never Used    History  Alcohol Use No    Family History  Problem Relation Age of Onset  . Hypertension Father   . Cancer Father     Review of Systems: Constitutional: no fever chills diaphoresis or fatigue or change in weight.  Head and neck: no hearing loss, no epistaxis, no photophobia or visual disturbance. Respiratory: No cough, shortness of breath or wheezing. Cardiovascular: No chest pain peripheral edema, palpitations. Gastrointestinal: No abdominal distention, no abdominal pain, no change  in bowel habits hematochezia or melena. Genitourinary: No dysuria, no frequency, no urgency, no nocturia. Musculoskeletal:No arthralgias, no back pain, no gait disturbance or myalgias. Neurological: No dizziness, no headaches, no numbness, no seizures, no syncope, no weakness, no tremors. Hematologic: No lymphadenopathy, no easy bruising. Psychiatric: No confusion, no hallucinations, no sleep disturbance.    Physical Exam: Filed Vitals:   09/01/11 1315  BP: 128/78  Pulse: 70   general appearance reveals an elderly woman in no acute distress.Pupils equal and reactive.   Extraocular Movements are full.  There is no scleral icterus.  The mouth and pharynx are normal.  The neck is supple.  The carotids reveal no bruits.  The jugular venous pressure  is normal.  The thyroid is not enlarged.  There is no lymphadenopathy.  The chest is clear to percussion and auscultation. There are no rales or rhonchi. Expansion of the chest is symmetrical.  Heart reveals grade 2/6 systolic ejection murmur and grade 2/6 decrescendo murmur of aortic insufficiency at the left sternal edge.  Rhythm is irregular.  No gallop or rub.The abdomen is soft and nontender. Bowel sounds are normal. The liver and spleen are not enlarged. There Are no abdominal masses. There are no bruits.  The pedal pulses are good.  There is no phlebitis or edema.  There is no cyanosis or clubbing. Strength is normal and symmetrical in all extremities.  There is no lateralizing weakness.  There are no sensory deficits.  Integument there is no rash   Assessment / Plan: Continue same medication.  Blood work is pending today.  We will also attempt to followup a uric acid.  Recheck in 3 months for followup office visit and EKG.  After that consider getting another echocardiogram to update her aortic valve status.

## 2011-09-01 NOTE — Assessment & Plan Note (Signed)
Her malaise and fatigue have improved since last visit.

## 2011-09-01 NOTE — Assessment & Plan Note (Signed)
Occasional flare of gout in right foot

## 2011-09-01 NOTE — Assessment & Plan Note (Signed)
The patient has a prominent murmur of aortic insufficiency.  Last echocardiogram in January 2001 showed moderate aortic stenosis and marked aortic insufficiency as well as mild mitral regurgitation and mild tricuspid regurgitation and moderate pulmonary hypertension with a pulmonary artery pressure of 52.  She is not having any new symptoms referable to her valvular heart disease.  Her exercise tolerance has improved since we cut back on some of her medicines.

## 2011-09-02 ENCOUNTER — Telehealth: Payer: Self-pay | Admitting: *Deleted

## 2011-09-02 NOTE — Telephone Encounter (Signed)
Message copied by Burnell Blanks on Tue Sep 02, 2011  4:33 PM ------      Message from: [name]      Created: Tue Sep 02, 2011  4:09 PM       The LDL cholesterol is elevated.  Watch diet carefully.

## 2011-09-02 NOTE — Telephone Encounter (Signed)
Advised husband  

## 2011-09-02 NOTE — Telephone Encounter (Signed)
Message copied by Burnell Blanks on Tue Sep 02, 2011  4:33 PM ------      Message from: [name]      Created: Tue Sep 02, 2011  4:07 PM       Please report.  The liver studies are normal.  The kidney function is stable.  The cholesterol is 235.  We'll watch her diet closely.  Continue same medication

## 2011-09-02 NOTE — Telephone Encounter (Signed)
Message copied by Burnell Blanks on Tue Sep 02, 2011  4:31 PM ------      Message from: [name]      Created: Tue Sep 02, 2011  4:08 PM       The uric acid level is elevated at 9.2.  I want her to increase her allopurinol to 200 mg daily

## 2011-09-10 ENCOUNTER — Other Ambulatory Visit: Payer: Self-pay | Admitting: Internal Medicine

## 2011-09-10 NOTE — Telephone Encounter (Signed)
Rx refill denied. Pharmacy advised to have patient contact office.

## 2011-09-11 ENCOUNTER — Other Ambulatory Visit: Payer: Self-pay | Admitting: Internal Medicine

## 2011-09-11 ENCOUNTER — Telehealth: Payer: Self-pay | Admitting: Internal Medicine

## 2011-09-11 MED ORDER — HYDROCOD POLST-CHLORPHEN POLST 10-8 MG/5ML PO LQCR: 5.0000 mL | Freq: Every evening | ORAL | Status: DC | PRN

## 2011-09-11 NOTE — Telephone Encounter (Signed)
Patient called regarding rx denial for tussionex. See previous note.

## 2011-09-11 NOTE — Telephone Encounter (Signed)
120cc  

## 2011-09-11 NOTE — Telephone Encounter (Signed)
Verbal Rx refill called to pharmacy to [facility]. Call placed to patient, she was informed Rx sent to pharmacy.

## 2011-09-11 NOTE — Telephone Encounter (Signed)
Call placed to [phone]. She wanted to know why the refill for the cough medicine was denied. She stated she usually gets a yearly supply, however she has ran out. She is scheduled to follow up on next week. She stated that she has a non productive cough that is mainly at night, fever 100 x 2 days, however no fever today. She states he symptoms started Tuesday morning. She would like to know if Dr Rodena Medin would refill cough medicine for her.

## 2011-09-17 ENCOUNTER — Ambulatory Visit (INDEPENDENT_AMBULATORY_CARE_PROVIDER_SITE_OTHER): Payer: Medicare Other | Admitting: Family

## 2011-09-17 ENCOUNTER — Encounter: Payer: Self-pay | Admitting: Family

## 2011-09-17 DIAGNOSIS — I10 Essential (primary) hypertension: Secondary | ICD-10-CM

## 2011-09-17 DIAGNOSIS — E785 Hyperlipidemia, unspecified: Secondary | ICD-10-CM

## 2011-09-17 DIAGNOSIS — I509 Heart failure, unspecified: Secondary | ICD-10-CM

## 2011-09-17 DIAGNOSIS — I4891 Unspecified atrial fibrillation: Secondary | ICD-10-CM

## 2011-09-17 DIAGNOSIS — R05 Cough: Secondary | ICD-10-CM

## 2011-09-17 MED ORDER — HYDROCOD POLST-CHLORPHEN POLST 10-8 MG/5ML PO LQCR: 5.0000 mL | Freq: Every evening | ORAL | Status: DC | PRN

## 2011-09-17 NOTE — Patient Instructions (Signed)
Please follow up in 3 months. Sooner if problems or concerns.  

## 2011-09-17 NOTE — Assessment & Plan Note (Signed)
Statin was discontinued by cardiology due to myalgias.  Defer management of lipids to cardiology.

## 2011-09-17 NOTE — Assessment & Plan Note (Signed)
Sounds like she has a resolving viral bronchitis. I have advised her to use the tussionex sparingly.

## 2011-09-17 NOTE — Assessment & Plan Note (Addendum)
BP is stable, denies further dizziness.  Continue current doses of amlodipine and metoprolol.

## 2011-09-17 NOTE — Progress Notes (Signed)
Subjective:    Patient ID: [name], female    DOB: 11-Dec-1940, 70 y.o.   MRN: [id number]  HPI  Pt presents today for follow up.    1) cough- gets it "every year."  Reports that she has had recent sore throat and low grade temperature with cough.  She reports that she has been using the tussionex every 12 hours.  Cough is improved.  She is requesting a refill on the tussionex.  2) Gout- reports that this has been well controlled.  3) AF- this is being managed by Dr. Patty Sermons.  She is on aspirin therapy daily.    4) HTN-  She reports resolution of her dizziness.    5) CHF-  Reports that her shortness of breath and swelling is at baseline.    Review of Systems    see HPI  Past Medical History  Diagnosis Date  . History of ventricular tachycardia   . ICD (implantable cardiac defibrillator) in place   . DOE (dyspnea on exertion)   . Peripheral edema   . Aortic insufficiency   . 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 History of gout   . Exogenous obesity   . History of CHF (congestive heart failure)   . Renal insufficiency     History   Social History  . Marital Status: Divorced    Spouse Name: N/A    Number of Children: N/A  . Years of Education: N/A   Occupational History  . Not on file.   Social History Main Topics  . Smoking status: Never Smoker   . Smokeless tobacco: Never Used  . Alcohol Use: No  . Drug Use: No  . Sexually Active: Not on file   Other Topics Concern  . Not on file   Social History Narrative  . No narrative on file    Past Surgical History  Procedure Date  . Cardiac defibrillator placement   . Cardiac catheterization     Family History  Problem Relation Age of Onset  . Hypertension Father   . Cancer Father     Allergies  Allergen Reactions  . Latex Rash  . Triple Antibiotic Rash    Current Outpatient Prescriptions on File Prior to Visit  Medication Sig Dispense Refill  . allopurinol (ZYLOPRIM)  100 MG tablet Take 100 mg by mouth as directed. 2 tablets daily       . amLODipine (NORVASC) 5 MG tablet Take 1 tablet (5 mg total) by mouth daily.  90 tablet  3  . aspirin 325 MG tablet Take 325 mg by mouth daily.        . bisacodyl (DULCOLAX) 5 MG EC tablet Take 5 mg by mouth daily as needed.        . furosemide (LASIX) 40 MG tablet Take 1 tablet (40 mg total) by mouth daily.  90 tablet  3  . metoprolol succinate (TOPROL-XL) 25 MG 24 hr tablet Take 1 tablet (25 mg total) by mouth daily.  90 tablet  3  . nitroGLYCERIN (NITROSTAT) 0.4 MG SL tablet Place 1 tablet (0.4 mg total) under the tongue every 5 (five) minutes as needed.  25 tablet  prn  . potassium chloride SA (K-DUR,KLOR-CON) 20 MEQ tablet Take 1 tablet (20 mEq total) by mouth daily.  90 tablet  3    BP 110/70  Pulse 88  Wt 188 lb (85.276 kg)    Objective:   Physical Exam  Constitutional: She appears well-developed and well-nourished. No distress.  Cardiovascular: Normal rate and regular rhythm.   Murmur heard.  Systolic murmur is present with a grade of 2/6  Pulmonary/Chest: Effort normal and breath sounds normal. No respiratory distress. She has no wheezes. She has no rales. She exhibits no tenderness.  Musculoskeletal:       1+ bilateral LE edema.   Psychiatric: She has a normal mood and affect. Her speech is normal and behavior is normal. Judgment and thought content normal. Cognition and memory are normal.          Assessment & Plan:

## 2011-09-17 NOTE — Assessment & Plan Note (Signed)
Clinically euvolemic.  Monitor.  

## 2011-09-17 NOTE — Assessment & Plan Note (Signed)
Rate stable. On aspirin- defer management to cards.

## 2011-09-30 DIAGNOSIS — I2510 Atherosclerotic heart disease of native coronary artery without angina pectoris: Secondary | ICD-10-CM

## 2011-09-30 HISTORY — DX: Atherosclerotic heart disease of native coronary artery without angina pectoris: I25.10

## 2011-09-30 HISTORY — PX: CARDIAC DEFIBRILLATOR PLACEMENT: SHX171

## 2011-11-04 ENCOUNTER — Telehealth: Payer: Self-pay | Admitting: *Deleted

## 2011-11-04 ENCOUNTER — Ambulatory Visit (INDEPENDENT_AMBULATORY_CARE_PROVIDER_SITE_OTHER): Payer: Medicare Other | Admitting: Internal Medicine

## 2011-11-04 ENCOUNTER — Encounter: Payer: Self-pay | Admitting: Internal Medicine

## 2011-11-04 DIAGNOSIS — I509 Heart failure, unspecified: Secondary | ICD-10-CM

## 2011-11-04 DIAGNOSIS — I5022 Chronic systolic (congestive) heart failure: Secondary | ICD-10-CM

## 2011-11-04 DIAGNOSIS — I469 Cardiac arrest, cause unspecified: Secondary | ICD-10-CM | POA: Insufficient documentation

## 2011-11-04 DIAGNOSIS — L659 Nonscarring hair loss, unspecified: Secondary | ICD-10-CM | POA: Insufficient documentation

## 2011-11-04 DIAGNOSIS — Z95810 Presence of automatic (implantable) cardiac defibrillator: Secondary | ICD-10-CM

## 2011-11-04 DIAGNOSIS — I5043 Acute on chronic combined systolic (congestive) and diastolic (congestive) heart failure: Secondary | ICD-10-CM

## 2011-11-04 HISTORY — DX: Presence of automatic (implantable) cardiac defibrillator: Z95.810

## 2011-11-04 LAB — ICD DEVICE OBSERVATION
BATTERY VOLTAGE: 3.13 V
BRDY-0002RV: 50 {beats}/min
CHARGE TIME: 9.499 s
RV LEAD AMPLITUDE: 11.9808 mv
RV LEAD THRESHOLD: 1.5 V
TZAT-0001FASTVT: 1
TZAT-0002FASTVT: NEGATIVE
TZAT-0012FASTVT: 200 ms
TZAT-0012SLOWVT: 200 ms
TZAT-0012SLOWVT: 200 ms
TZAT-0018SLOWVT: NEGATIVE
TZAT-0018SLOWVT: NEGATIVE
TZAT-0019SLOWVT: 8 V
TZAT-0019SLOWVT: 8 V
TZAT-0020SLOWVT: 1.5 ms
TZAT-0020SLOWVT: 1.5 ms
TZON-0003VSLOWVT: 450 ms
TZON-0005SLOWVT: 12
TZST-0001FASTVT: 4
TZST-0001FASTVT: 5
TZST-0001SLOWVT: 4
TZST-0001SLOWVT: 5
TZST-0002FASTVT: NEGATIVE
TZST-0002FASTVT: NEGATIVE
TZST-0003SLOWVT: 15 J
TZST-0003SLOWVT: 35 J
VENTRICULAR PACING ICD: 8.9 pct
VF: 0

## 2011-11-04 MED ORDER — FUROSEMIDE 40 MG PO TABS: ORAL_TABLET | ORAL | Status: DC

## 2011-11-04 MED ORDER — LOSARTAN POTASSIUM 25 MG PO TABS: 25.0000 mg | ORAL_TABLET | Freq: Every day | ORAL | Status: DC

## 2011-11-04 NOTE — Telephone Encounter (Signed)
Received fax from [facility] requesting patient discontinue ASA 10 days prior to upcoming eye lid surgery.   Dr. Patty Sermons signed and ok to hold 5 days prior to procedure.  Will fax back

## 2011-11-04 NOTE — Assessment & Plan Note (Signed)
We'll check a metabolic profile in 3 weeks  Her Cr was 1.4  I have resumed her Cozaar as a vasodilator for her aortic insufficiency as I have stopped the amlodipine for the reasons mentioned above.

## 2011-11-04 NOTE — Progress Notes (Signed)
  HPI  [name] is a 71 y.o. female is seen in followup for aborted cardiac arrest s/p  implanted ICD. 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 She is s/p greenfield IVC filter   The last cath I could find was 2002 without obstructive disease  Her most recent echo from 2012 demonstrated left ventricular dysfunction that was mild-40-50% with moderate aortic insufficiency and mild  aortic stenosis.  . Left ventricular dimensions were 47 and 61  Her dypnea is extremely limiting; she has modest edema  he also has periodic edema which correlates in part with her shortness of breath. She adjust s her Lasix on an as-needed basis to accommodate this. Her diet is full salt.   She is not able to recall for me a number of interventions for Dr. Patty Sermons has undertaken over the last 12 months. She does not recall discussions regarding her heart valve or her echo.  She has undergone cataract surgery she is also in scheduled to undergo blepharoplasty under local anesthesia    Past Medical History  Diagnosis Date  . History of ventricular tachycardia   . ICD (implantable cardiac defibrillator) in place   . DOE (dyspnea on exertion)   . Peripheral edema   . Aortic insufficiency   . 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 History of gout   . Exogenous obesity   . History of CHF (congestive heart failure)   . Renal insufficiency     Past Surgical History  Procedure Date  . Cardiac defibrillator placement   . Cardiac catheterization     Current Outpatient Prescriptions  Medication Sig Dispense Refill  . allopurinol (ZYLOPRIM) 100 MG tablet Take 100 mg by mouth as directed. 2 tablets daily       . amLODipine (NORVASC) 5 MG tablet Take 1 tablet (5 mg total) by mouth daily.  90 tablet  3  . aspirin 325 MG tablet Take 325 mg by mouth daily.        . furosemide (LASIX) 40 MG tablet Take 1 tablet  (40 mg total) by mouth daily.  90 tablet  3  . metoprolol succinate (TOPROL-XL) 25 MG 24 hr tablet Take 1 tablet (25 mg total) by mouth daily.  90 tablet  3  . nitroGLYCERIN (NITROSTAT) 0.4 MG SL tablet Place 1 tablet (0.4 mg total) under the tongue every 5 (five) minutes as needed.  25 tablet  prn  . potassium chloride SA (K-DUR,KLOR-CON) 20 MEQ tablet Take 1 tablet (20 mEq total) by mouth daily.  90 tablet  3    Allergies  Allergen Reactions  . Latex Rash  . Triple Antibiotic Rash    Review of Systems negative except from HPI and PMH  Physical Exam BP 138/67  Pulse 74  Ht 5\' 2"  (1.575 m)  Wt 190 lb 6.4 oz (86.365 kg)  BMI 34.82 kg/m2 Well developed and well nourished in no acute distress HENT normal E scleral and icterus clear Neck Supple JVP flat; carotids brisk and full Clear to ausculation Regular rate and rhythm, no murmurs gallops or rub Soft with active bowel sounds No clubbing cyanosis Trace and nonpitting  Edema Alert and oriented, grossly normal motor and sensory function Skin Warm and Dry   Assessment and  Plan

## 2011-11-04 NOTE — Assessment & Plan Note (Signed)
Her ventricular dimensions remain modestly increased  Dr. Patty Sermons is following this closely

## 2011-11-04 NOTE — Assessment & Plan Note (Signed)
I am not sure to what degree her amlodipine is contributing to her edema but I thought it is worth experimenting in stopping it. She'll be seeing Dr. Patty Sermons in about 4 weeks.

## 2011-11-04 NOTE — Assessment & Plan Note (Signed)
The patient's device was interrogated.  The information was reviewed. No changes were made in the programming.   There was an increase in the optivol and I've suggested in correlation with her shortness of breath and edema which is Lasix sensitive to increase her Lasix to taking 2 every third day on a regular basis instead of just as needed. When she sees Dr. Patty Sermons in 3 weeks, will be important to check a metabolic profile.

## 2011-11-04 NOTE — Patient Instructions (Signed)
Your physician has recommended you make the following change in your medication:  1) Increase lasix (furosemide) to 40 mg one tablet daily, except every third day take two tablets daily. 2) Stop metoprolol. 3) Start Cozaar (losartan) 25 mg once daily.  Your physician recommends that you return for lab work in: 3 weeks- bmp (the day you see Dr. Patty Sermons back).  You will be due for an Optivol check on your device (from home) on 12/11/11.  Remote monitoring is used to monitor your Pacemaker of ICD from home. This monitoring reduces the number of office visits required to check your device to one time per year. It allows [place] to keep an eye on the functioning of your device to ensure it is working properly. You are scheduled for a device check from home on 01/29/12. You may send your transmission at any time that day. If you have a wireless device, the transmission will be sent automatically. After your physician reviews your transmission, you will receive a postcard with your next transmission date.  Your physician wants you to follow-up in: 1 year with Dr. Graciela Husbands. You will receive a reminder letter in the mail two months in advance. If you don't receive a letter, please call our office to schedule the follow-up appointment.

## 2011-11-04 NOTE — Assessment & Plan Note (Signed)
No recurrent ventricular arrhythmias 

## 2011-11-04 NOTE — Assessment & Plan Note (Signed)
Out patient may be related to her beta blocker. As a consequence, I have undertaken the exclusion experiment to stop her metoprolol. She'll be followed up with Dr. Patty Sermons about 3 or 4 weeks and he can assess the intervention

## 2011-11-04 NOTE — Assessment & Plan Note (Signed)
This is likely multifactorial; we'll increase her Lasix as noted above

## 2011-11-28 ENCOUNTER — Telehealth: Payer: Self-pay | Admitting: Internal Medicine

## 2011-11-28 NOTE — Telephone Encounter (Signed)
LOV,Cath,Echo faxed to [facility] @ [phone] 11/28/11/KM

## 2011-12-01 ENCOUNTER — Encounter: Payer: Self-pay | Admitting: Cardiology

## 2011-12-01 ENCOUNTER — Ambulatory Visit (INDEPENDENT_AMBULATORY_CARE_PROVIDER_SITE_OTHER): Payer: Medicare Other | Admitting: Cardiology

## 2011-12-01 DIAGNOSIS — M109 Gout, unspecified: Secondary | ICD-10-CM

## 2011-12-01 DIAGNOSIS — Z862 Personal history of diseases of the blood and blood-forming organs and certain disorders involving the immune mechanism: Secondary | ICD-10-CM

## 2011-12-01 DIAGNOSIS — I4891 Unspecified atrial fibrillation: Secondary | ICD-10-CM

## 2011-12-01 DIAGNOSIS — I359 Nonrheumatic aortic valve disorder, unspecified: Secondary | ICD-10-CM

## 2011-12-01 DIAGNOSIS — I352 Nonrheumatic aortic (valve) stenosis with insufficiency: Secondary | ICD-10-CM

## 2011-12-01 DIAGNOSIS — I10 Essential (primary) hypertension: Secondary | ICD-10-CM

## 2011-12-01 DIAGNOSIS — I469 Cardiac arrest, cause unspecified: Secondary | ICD-10-CM

## 2011-12-01 DIAGNOSIS — I351 Nonrheumatic aortic (valve) insufficiency: Secondary | ICD-10-CM

## 2011-12-01 DIAGNOSIS — Z8739 Personal history of other diseases of the musculoskeletal system and connective tissue: Secondary | ICD-10-CM

## 2011-12-01 DIAGNOSIS — I119 Hypertensive heart disease without heart failure: Secondary | ICD-10-CM

## 2011-12-01 NOTE — Progress Notes (Signed)
[name] Date of Birth:  [birth year] [facility] [address] [place], [place]  [address] [phone]         Fax   [phone]  History of Present Illness: This pleasant 71 year old woman is seen for a scheduled followup office visit.  She has a history of previous are noted cardiac arrest.  She has a defibrillator in place.  She has a past history of chronic atrial fibrillation.  She is not on Coumadin because of previous intracerebral hemorrhage.  He has a history of valvular heart disease with aortic stenosis and aortic insufficiency.  She has a history of compensated congestive heart failure.  She sleeps on one or 2 pillows.  She has not been experiencing any paroxysmal nocturnal dyspnea.  Denies any rest pain and she does not have any history of ischemic heart disease.  He had cardiac catheterization on 05/19/01 by Dr. tablet which showed moderate aortic insufficiency and normal coronary arteries and mild dilatation of the aortic root and left ventricular enlargement with mildly reduced global LV function at that time.  Since we last saw the patient several months ago she has been doing reasonably well.  She has had eye surgery and now needs to have her eyelids days because of interference with her vision.  Her surgery is scheduled in [facility] for March 13.  Her ophthalmologist is requesting that her aspirin dosage be decreased preoperatively to just 81 mg a day and we gave permission for that.  Current Outpatient Prescriptions  Medication Sig Dispense Refill  . allopurinol (ZYLOPRIM) 100 MG tablet Take 100 mg by mouth as directed. 2 tablets daily       . aspirin 325 MG tablet Take 325 mg by mouth daily.        . furosemide (LASIX) 40 MG tablet Take one tablet by mouth daily, except every third day take two tablets once daily  100 tablet  3  . losartan (COZAAR) 25 MG tablet Take 1 tablet (25 mg total) by mouth daily.  90 tablet  3  . nitroGLYCERIN (NITROSTAT)  0.4 MG SL tablet Place 1 tablet (0.4 mg total) under the tongue every 5 (five) minutes as needed.  25 tablet  prn  . polyethylene glycol (MIRALAX / GLYCOLAX) packet Take 17 g by mouth. Every other day as needed      . potassium chloride SA (K-DUR,KLOR-CON) 20 MEQ tablet Take 1 tablet (20 mEq total) by mouth daily.  90 tablet  3    Allergies  Allergen Reactions  . Latex Rash  . Triple Antibiotic Rash    Patient Active Problem List  Diagnoses  . HYPERLIPIDEMIA  . GOUT  . HYPERKALEMIA  . HYPERTENSION  . Atrial fibrillation  . CONGESTIVE HEART FAILURE  . VENOUS INSUFFICIENCY  . BACK PAIN, LUMBAR  . MEMORY LOSS  . EDEMA  . FOOT PAIN, BILATERAL  . COMBINED HEART FAILURE, ACUTE ON CHRONIC  . SHORTNESS OF BREATH  . ALKALINE PHOSPHATASE, ELEVATED  . Aortic insufficiency and aortic stenosis  . Acute on chronic renal insufficiency  . Cardiac arrest  . Automatic implantable cardiac defibrillator single-chamber-Medtronic  . Alopecia    History  Smoking status  . Never Smoker   Smokeless tobacco  . Never Used    History  Alcohol Use No    Family History  Problem Relation Age of Onset  . Hypertension Father   . Cancer Father     Review of Systems: Constitutional: no fever chills diaphoresis  or fatigue or change in weight.  Head and neck: no hearing loss, no epistaxis, no photophobia or visual disturbance. Respiratory: No cough, shortness of breath or wheezing. Cardiovascular: No chest pain peripheral edema, palpitations. Gastrointestinal: No abdominal distention, no abdominal pain, no change in bowel habits hematochezia or melena. Genitourinary: No dysuria, no frequency, no urgency, no nocturia. Musculoskeletal:No arthralgias, no back pain, no gait disturbance or myalgias. Neurological: No dizziness, no headaches, no numbness, no seizures, no syncope, no weakness, no tremors. Hematologic: No lymphadenopathy, no easy bruising. Psychiatric: No confusion, no hallucinations,  no sleep disturbance.    Physical Exam: Filed Vitals:   12/01/11 0911  BP: 130/82  Pulse: 93   the general appearance reveals a pleasant elderly woman in no distress.Pupils equal and reactive.   Extraocular Movements are full.  There is no scleral icterus.  The mouth and pharynx are normal.  The neck is supple.  The carotids reveal no bruits.  The jugular venous pressure is normal.  The thyroid is not enlarged.  There is no lymphadenopathy.  The chest is clear to percussion and auscultation. There are no rales or rhonchi. Expansion of the chest is symmetrical.  The heart reveals a grade 2/6 decrescendo murmur of aortic insufficiency at the left sternal edge.  No gallop or rub.The abdomen is soft and nontender. Bowel sounds are normal. The liver and spleen are not enlarged. There Are no abdominal masses. There are no bruits.  The pedal pulses are good.  There is no phlebitis or edema.  There is no cyanosis or clubbing. Strength is normal and symmetrical in all extremities.  There is no lateralizing weakness.  There are no sensory deficits.   EKG today shows atrial fibrillation with a controlled ventricular response at 93 per minute.  She has minimal voltage for LVH and she has poor R-wave progression V1 through V4  Impression/plan  Continue same medication.  Add  biotin to prevent hair loss. Add MiraLax 17 g daily for constipation recheck in 4 months for followup office visit CBC uric acid lipid panel hepatic function panel and basal metabolic panel.

## 2011-12-01 NOTE — Assessment & Plan Note (Signed)
The patient has noted hair loss.  She is on a much lower dose of ARB now.  We will also recommend that she try Biotin to prevent further hair loss.

## 2011-12-01 NOTE — Assessment & Plan Note (Signed)
The patient is having any acute gouty attacks since last visit.

## 2011-12-01 NOTE — Assessment & Plan Note (Signed)
Her symptoms of exertional dyspnea have remained stable since last visit.  She has not been experiencing any recent significant peripheral edema

## 2011-12-01 NOTE — Patient Instructions (Signed)
Add Miralax 17 g (cap full to line) every other day as needed to help with bowels Your physician wants you to follow-up in: 4 months You will receive a reminder letter in the mail two months in advance. If you don't receive a letter, please call our office to schedule the follow-up appointment.

## 2011-12-01 NOTE — Assessment & Plan Note (Signed)
The patient has not had any TIA symptoms from her atrial fibrillation.

## 2011-12-11 ENCOUNTER — Ambulatory Visit (INDEPENDENT_AMBULATORY_CARE_PROVIDER_SITE_OTHER): Payer: Medicare Other | Admitting: *Deleted

## 2011-12-11 ENCOUNTER — Encounter: Payer: Self-pay | Admitting: Internal Medicine

## 2011-12-11 DIAGNOSIS — I5043 Acute on chronic combined systolic (congestive) and diastolic (congestive) heart failure: Secondary | ICD-10-CM

## 2011-12-12 LAB — REMOTE ICD DEVICE
BRDY-0002RV: 50 {beats}/min
CHARGE TIME: 9.499 s
FVT: 0
RV LEAD IMPEDENCE ICD: 744 Ohm
TOT-0001: 0
TOT-0002: 0
TOT-0006: 20091020000000
TZAT-0002FASTVT: NEGATIVE
TZAT-0005SLOWVT: 81 pct
TZAT-0005SLOWVT: 91 pct
TZAT-0011SLOWVT: 10 ms
TZAT-0011SLOWVT: 10 ms
TZAT-0012SLOWVT: 200 ms
TZAT-0013SLOWVT: 4
TZAT-0013SLOWVT: 4
TZAT-0018SLOWVT: NEGATIVE
TZAT-0018SLOWVT: NEGATIVE
TZAT-0019FASTVT: 8 V
TZAT-0019SLOWVT: 8 V
TZAT-0019SLOWVT: 8 V
TZAT-0020FASTVT: 1.5 ms
TZON-0004SLOWVT: 16
TZON-0005SLOWVT: 12
TZST-0001FASTVT: 3
TZST-0001FASTVT: 5
TZST-0001SLOWVT: 4
TZST-0001SLOWVT: 6
TZST-0002FASTVT: NEGATIVE
TZST-0002FASTVT: NEGATIVE
TZST-0002FASTVT: NEGATIVE
TZST-0002FASTVT: NEGATIVE
TZST-0003SLOWVT: 35 J
TZST-0003SLOWVT: 35 J
TZST-0003SLOWVT: 35 J

## 2011-12-15 ENCOUNTER — Ambulatory Visit: Payer: Medicare Other | Admitting: Family

## 2011-12-25 NOTE — Progress Notes (Signed)
Remote optivol check only  

## 2012-01-02 ENCOUNTER — Encounter: Payer: Self-pay | Admitting: *Deleted

## 2012-01-23 ENCOUNTER — Telehealth: Payer: Self-pay | Admitting: Internal Medicine

## 2012-01-23 DIAGNOSIS — I10 Essential (primary) hypertension: Secondary | ICD-10-CM

## 2012-01-23 DIAGNOSIS — E785 Hyperlipidemia, unspecified: Secondary | ICD-10-CM

## 2012-01-23 DIAGNOSIS — Z0000 Encounter for general adult medical examination without abnormal findings: Secondary | ICD-10-CM

## 2012-01-23 DIAGNOSIS — Z79899 Other long term (current) drug therapy: Secondary | ICD-10-CM

## 2012-01-23 NOTE — Telephone Encounter (Signed)
Lab order entered for May 2013. 

## 2012-01-23 NOTE — Telephone Encounter (Signed)
Patient has upcoming cpe on 02/20/12. She would like labs prior. She will be going to [facility] lab.

## 2012-01-28 HISTORY — PX: OTHER SURGICAL HISTORY: SHX169

## 2012-01-31 ENCOUNTER — Telehealth: Payer: Self-pay | Admitting: Nurse Practitioner

## 2012-01-31 NOTE — Telephone Encounter (Signed)
Received call from pts family member this AM stating that pt c/o feeling poorly and is SOB.  No chest pain.  Her BP is 168/98 - She took all of her usually scheduled medications last night - as she usually would.  I recommended that if she's feeling poorly, she will have to come into the ED for evaluation.  Family member verbalized understanding.

## 2012-02-05 ENCOUNTER — Encounter: Payer: Self-pay | Admitting: Internal Medicine

## 2012-02-05 ENCOUNTER — Ambulatory Visit (INDEPENDENT_AMBULATORY_CARE_PROVIDER_SITE_OTHER): Payer: Medicare Other | Admitting: *Deleted

## 2012-02-05 DIAGNOSIS — I5043 Acute on chronic combined systolic (congestive) and diastolic (congestive) heart failure: Secondary | ICD-10-CM

## 2012-02-05 DIAGNOSIS — I469 Cardiac arrest, cause unspecified: Secondary | ICD-10-CM

## 2012-02-11 LAB — REMOTE ICD DEVICE
CHARGE TIME: 9.629 s
DEV-0020ICD: NEGATIVE
RV LEAD AMPLITUDE: 12 mv
RV LEAD IMPEDENCE ICD: 736 Ohm
TOT-0001: 0
TOT-0002: 0
TOT-0006: 20091020000000
TZAT-0001FASTVT: 1
TZAT-0004SLOWVT: 8
TZAT-0004SLOWVT: 8
TZAT-0005SLOWVT: 81 pct
TZAT-0005SLOWVT: 91 pct
TZAT-0012SLOWVT: 200 ms
TZAT-0012SLOWVT: 200 ms
TZAT-0013SLOWVT: 4
TZAT-0013SLOWVT: 4
TZAT-0018FASTVT: NEGATIVE
TZON-0003SLOWVT: 330 ms
TZON-0004SLOWVT: 16
TZON-0004VSLOWVT: 20
TZON-0005SLOWVT: 12
TZST-0001FASTVT: 2
TZST-0001FASTVT: 3
TZST-0001FASTVT: 4
TZST-0001FASTVT: 6
TZST-0001SLOWVT: 4
TZST-0001SLOWVT: 6
TZST-0002FASTVT: NEGATIVE
TZST-0002FASTVT: NEGATIVE
TZST-0003SLOWVT: 35 J
TZST-0003SLOWVT: 35 J
VENTRICULAR PACING ICD: 2.96 pct
VF: 0

## 2012-02-13 ENCOUNTER — Other Ambulatory Visit: Payer: Self-pay | Admitting: *Deleted

## 2012-02-13 DIAGNOSIS — Z79899 Other long term (current) drug therapy: Secondary | ICD-10-CM

## 2012-02-13 DIAGNOSIS — I10 Essential (primary) hypertension: Secondary | ICD-10-CM

## 2012-02-13 DIAGNOSIS — Z0000 Encounter for general adult medical examination without abnormal findings: Secondary | ICD-10-CM

## 2012-02-13 DIAGNOSIS — E785 Hyperlipidemia, unspecified: Secondary | ICD-10-CM

## 2012-02-13 LAB — HEPATIC FUNCTION PANEL
ALT: 10 U/L (ref 0–35)
AST: 18 U/L (ref 0–37)
Albumin: 4.4 g/dL (ref 3.5–5.2)
Alkaline Phosphatase: 107 U/L (ref 39–117)
Total Protein: 7.2 g/dL (ref 6.0–8.3)

## 2012-02-13 LAB — CBC
Hemoglobin: 13.5 g/dL (ref 12.0–15.0)
MCH: 30.3 pg (ref 26.0–34.0)
RBC: 4.46 MIL/uL (ref 3.87–5.11)

## 2012-02-13 LAB — TSH: TSH: 2.387 u[IU]/mL (ref 0.350–4.500)

## 2012-02-13 LAB — BASIC METABOLIC PANEL
CO2: 22 mEq/L (ref 19–32)
Calcium: 9.1 mg/dL (ref 8.4–10.5)
Glucose, Bld: 90 mg/dL (ref 70–99)
Sodium: 142 mEq/L (ref 135–145)

## 2012-02-13 LAB — LIPID PANEL: HDL: 59 mg/dL (ref >39)

## 2012-02-14 ENCOUNTER — Encounter (HOSPITAL_COMMUNITY): Payer: Self-pay | Admitting: Emergency Medicine

## 2012-02-14 ENCOUNTER — Inpatient Hospital Stay (HOSPITAL_COMMUNITY)
Admission: EM | Admit: 2012-02-14 | Discharge: 2012-02-20 | DRG: 252 | Disposition: A | Discharge status: Skilled Nursing Facility | Payer: Medicare Other | Attending: Internal Medicine | Admitting: Internal Medicine

## 2012-02-14 DIAGNOSIS — R748 Abnormal levels of other serum enzymes: Secondary | ICD-10-CM

## 2012-02-14 DIAGNOSIS — I080 Rheumatic disorders of both mitral and aortic valves: Secondary | ICD-10-CM | POA: Diagnosis present

## 2012-02-14 DIAGNOSIS — I5022 Chronic systolic (congestive) heart failure: Secondary | ICD-10-CM | POA: Diagnosis present

## 2012-02-14 DIAGNOSIS — Z95810 Presence of automatic (implantable) cardiac defibrillator: Secondary | ICD-10-CM

## 2012-02-14 DIAGNOSIS — I10 Essential (primary) hypertension: Secondary | ICD-10-CM

## 2012-02-14 DIAGNOSIS — N183 Chronic kidney disease, stage 3 unspecified: Secondary | ICD-10-CM | POA: Diagnosis present

## 2012-02-14 DIAGNOSIS — D649 Anemia, unspecified: Secondary | ICD-10-CM | POA: Diagnosis present

## 2012-02-14 DIAGNOSIS — Z8673 Personal history of transient ischemic attack (TIA), and cerebral infarction without residual deficits: Secondary | ICD-10-CM

## 2012-02-14 DIAGNOSIS — E875 Hyperkalemia: Secondary | ICD-10-CM

## 2012-02-14 DIAGNOSIS — I352 Nonrheumatic aortic (valve) stenosis with insufficiency: Secondary | ICD-10-CM

## 2012-02-14 DIAGNOSIS — I743 Embolism and thrombosis of arteries of the lower extremities: Secondary | ICD-10-CM

## 2012-02-14 DIAGNOSIS — I824Z9 Acute embolism and thrombosis of unspecified deep veins of unspecified distal lower extremity: Secondary | ICD-10-CM | POA: Diagnosis present

## 2012-02-14 DIAGNOSIS — M79609 Pain in unspecified limb: Secondary | ICD-10-CM

## 2012-02-14 DIAGNOSIS — D638 Anemia in other chronic diseases classified elsewhere: Secondary | ICD-10-CM | POA: Diagnosis present

## 2012-02-14 DIAGNOSIS — I4891 Unspecified atrial fibrillation: Secondary | ICD-10-CM | POA: Diagnosis present

## 2012-02-14 DIAGNOSIS — I998 Other disorder of circulatory system: Secondary | ICD-10-CM | POA: Diagnosis present

## 2012-02-14 DIAGNOSIS — I509 Heart failure, unspecified: Secondary | ICD-10-CM | POA: Diagnosis present

## 2012-02-14 DIAGNOSIS — N289 Disorder of kidney and ureter, unspecified: Secondary | ICD-10-CM | POA: Diagnosis present

## 2012-02-14 DIAGNOSIS — K5900 Constipation, unspecified: Secondary | ICD-10-CM | POA: Diagnosis not present

## 2012-02-14 DIAGNOSIS — Z888 Allergy status to other drugs, medicaments and biological substances status: Secondary | ICD-10-CM

## 2012-02-14 DIAGNOSIS — I739 Peripheral vascular disease, unspecified: Secondary | ICD-10-CM

## 2012-02-14 DIAGNOSIS — Z6833 Body mass index (BMI) 33.0-33.9, adult: Secondary | ICD-10-CM

## 2012-02-14 DIAGNOSIS — Z7982 Long term (current) use of aspirin: Secondary | ICD-10-CM

## 2012-02-14 DIAGNOSIS — M545 Low back pain: Secondary | ICD-10-CM

## 2012-02-14 DIAGNOSIS — R609 Edema, unspecified: Secondary | ICD-10-CM

## 2012-02-14 DIAGNOSIS — I129 Hypertensive chronic kidney disease with stage 1 through stage 4 chronic kidney disease, or unspecified chronic kidney disease: Secondary | ICD-10-CM | POA: Diagnosis present

## 2012-02-14 DIAGNOSIS — E669 Obesity, unspecified: Secondary | ICD-10-CM | POA: Diagnosis present

## 2012-02-14 DIAGNOSIS — Z79899 Other long term (current) drug therapy: Secondary | ICD-10-CM

## 2012-02-14 DIAGNOSIS — M109 Gout, unspecified: Secondary | ICD-10-CM | POA: Diagnosis present

## 2012-02-14 DIAGNOSIS — I5043 Acute on chronic combined systolic (congestive) and diastolic (congestive) heart failure: Secondary | ICD-10-CM

## 2012-02-14 DIAGNOSIS — E785 Hyperlipidemia, unspecified: Secondary | ICD-10-CM

## 2012-02-14 DIAGNOSIS — R413 Other amnesia: Secondary | ICD-10-CM

## 2012-02-14 DIAGNOSIS — N189 Chronic kidney disease, unspecified: Secondary | ICD-10-CM

## 2012-02-14 DIAGNOSIS — E876 Hypokalemia: Secondary | ICD-10-CM

## 2012-02-14 DIAGNOSIS — I469 Cardiac arrest, cause unspecified: Secondary | ICD-10-CM

## 2012-02-14 DIAGNOSIS — I872 Venous insufficiency (chronic) (peripheral): Secondary | ICD-10-CM

## 2012-02-14 DIAGNOSIS — I5023 Acute on chronic systolic (congestive) heart failure: Secondary | ICD-10-CM | POA: Diagnosis present

## 2012-02-14 DIAGNOSIS — R0602 Shortness of breath: Secondary | ICD-10-CM

## 2012-02-14 HISTORY — DX: Essential (primary) hypertension: I10

## 2012-02-14 HISTORY — DX: Cerebral infarction, unspecified: I63.9

## 2012-02-14 LAB — URINALYSIS, ROUTINE W REFLEX MICROSCOPIC
Ketones, ur: NEGATIVE mg/dL
Leukocytes, UA: NEGATIVE
Leukocytes, UA: NEGATIVE
Nitrite: NEGATIVE
Protein, ur: 30 mg/dL — AB
Protein, ur: NEGATIVE mg/dL
Urobilinogen, UA: 0.2 mg/dL (ref 0.0–1.0)

## 2012-02-14 LAB — URINALYSIS, MICROSCOPIC ONLY

## 2012-02-14 LAB — PROTIME-INR
INR: 1.04 (ref 0.00–1.49)
Prothrombin Time: 13.8 seconds (ref 11.6–15.2)

## 2012-02-14 LAB — APTT: aPTT: 28 seconds (ref 24–37)

## 2012-02-14 MED ORDER — FUROSEMIDE 40 MG PO TABS
40.0000 mg | ORAL_TABLET | Freq: Every day | ORAL | Status: DC
  Administered 2012-02-16 – 2012-02-20 (×5): 40 mg via ORAL
  Filled 2012-02-14 (×6): qty 1

## 2012-02-14 MED ORDER — ACETAMINOPHEN 325 MG PO TABS
650.0000 mg | ORAL_TABLET | Freq: Four times a day (QID) | ORAL | Status: DC | PRN
  Administered 2012-02-18: 650 mg via ORAL
  Filled 2012-02-14: qty 2

## 2012-02-14 MED ORDER — ONDANSETRON HCL 4 MG PO TABS: 4.0000 mg | ORAL_TABLET | Freq: Four times a day (QID) | ORAL | Status: DC | PRN

## 2012-02-14 MED ORDER — SODIUM CHLORIDE 0.9 % IJ SOLN
3.0000 mL | Freq: Two times a day (BID) | INTRAMUSCULAR | Status: DC
  Administered 2012-02-14 – 2012-02-20 (×10): 3 mL via INTRAVENOUS

## 2012-02-14 MED ORDER — LOSARTAN POTASSIUM 25 MG PO TABS
25.0000 mg | ORAL_TABLET | Freq: Every day | ORAL | Status: DC
  Filled 2012-02-14: qty 1

## 2012-02-14 MED ORDER — ASPIRIN 325 MG PO TABS
325.0000 mg | ORAL_TABLET | Freq: Every day | ORAL | Status: DC
  Administered 2012-02-16: 325 mg via ORAL
  Filled 2012-02-14 (×3): qty 1

## 2012-02-14 MED ORDER — ALLOPURINOL 100 MG PO TABS
200.0000 mg | ORAL_TABLET | Freq: Every day | ORAL | Status: DC
  Administered 2012-02-14 – 2012-02-20 (×6): 200 mg via ORAL
  Filled 2012-02-14 (×7): qty 2

## 2012-02-14 MED ORDER — HEPARIN (PORCINE) IN NACL 100-0.45 UNIT/ML-% IJ SOLN
1250.0000 [IU]/h | INTRAMUSCULAR | Status: DC
  Administered 2012-02-14 (×2): 1000 [IU]/h via INTRAVENOUS
  Administered 2012-02-15: 1250 [IU]/h via INTRAVENOUS
  Filled 2012-02-14 (×2): qty 250

## 2012-02-14 MED ORDER — NITROGLYCERIN 0.4 MG SL SUBL: 0.4000 mg | SUBLINGUAL_TABLET | SUBLINGUAL | Status: DC | PRN

## 2012-02-14 MED ORDER — ACETAMINOPHEN 650 MG RE SUPP: 650.0000 mg | Freq: Four times a day (QID) | RECTAL | Status: DC | PRN

## 2012-02-14 MED ORDER — HEPARIN BOLUS VIA INFUSION
3500.0000 [IU] | Freq: Once | INTRAVENOUS | Status: AC
  Administered 2012-02-14: 3500 [IU] via INTRAVENOUS
  Filled 2012-02-14: qty 3500

## 2012-02-14 MED ORDER — ENOXAPARIN SODIUM 40 MG/0.4ML ~~LOC~~ SOLN
40.0000 mg | Freq: Every day | SUBCUTANEOUS | Status: DC
  Filled 2012-02-14: qty 0.4

## 2012-02-14 MED ORDER — POTASSIUM CHLORIDE CRYS ER 20 MEQ PO TBCR
20.0000 meq | EXTENDED_RELEASE_TABLET | Freq: Every day | ORAL | Status: DC
  Administered 2012-02-16 – 2012-02-20 (×5): 20 meq via ORAL
  Filled 2012-02-14 (×6): qty 1

## 2012-02-14 MED ORDER — ONDANSETRON HCL 4 MG/2ML IJ SOLN: 4.0000 mg | Freq: Four times a day (QID) | INTRAMUSCULAR | Status: DC | PRN

## 2012-02-14 NOTE — H&P (Signed)
PCP:   [name], [name], MD, MD   Chief Complaint:  Left lower leg pain.  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 She reports occasional DOE, denies any chest pain, syncope, palpitations,. She has  2 pillow orthopnea,. On arrival to ED, her left leg pain has resolved. Vascular surgery was consulted, suggested she might have left limb ischemia from possible emboli, which must have passed. Recommendations to admit the patient on Hospitalist service and start the patient on IV heparin. On further talking to patient, she wants to speak to Dr Patty Sermons Cardiology before she can agree to start the IV anticoagulation.   Review of Systems:  The patient denies anorexia, fever, weight loss,, vision loss, decreased hearing, hoarseness, chest pain, syncope, , peripheral edema, balance deficits, hemoptysis, abdominal pain, melena, hematochezia, severe indigestion/heartburn, hematuria, incontinence, genital sores, muscle weakness, suspicious skin lesions, transient blindness, difficulty walking, depression, unusual weight change, abnormal bleeding, enlarged lymph nodes, angioedema, and breast masses.  Past Medical History: Past Medical History  Diagnosis Date  . History of ventricular tachycardia   . ICD (implantable cardiac defibrillator) in place   . DOE (dyspnea on exertion)   . Peripheral edema   . Aortic insufficiency   . 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 History of gout   . Exogenous obesity   . History of CHF (congestive heart failure)   . Renal insufficiency   . 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 Hypertension   . Stroke    Past Surgical History  Procedure Date  . Cardiac defibrillator placement   .  Cardiac catheterization     Medications: Prior to Admission medications   Medication Sig Start Date End Date Taking? Authorizing Provider  allopurinol (ZYLOPRIM) 100 MG tablet Take 100 mg by mouth as directed. 2 tablets daily  07/15/11 07/14/12 Yes [name], MD  aspirin 325 MG tablet Take 325 mg by mouth daily.     Yes Historical Provider, MD  BIOTIN PO Take 1 capsule by mouth daily.   Yes Historical Provider, MD  furosemide (LASIX) 40 MG tablet Take one tablet by mouth daily, except every third day take two tablets once daily 11/04/11  Yes Duke Salvia, MD  losartan (COZAAR) 25 MG tablet Take 1 tablet (25 mg total) by mouth daily. 11/04/11 11/03/12 Yes Duke Salvia, MD  potassium chloride SA (K-DUR,KLOR-CON) 20 MEQ tablet Take 1 tablet (20 mEq total) by mouth daily. 07/15/11  Yes [name], MD  nitroGLYCERIN (NITROSTAT) 0.4 MG SL tablet Place 1 tablet (0.4 mg total) under the tongue every 5 (five) minutes as needed. 07/15/11   [name], MD    Allergies:   Allergies  Allergen Reactions  . Latex Rash  . Neomycin-Bacitracin Zn-Polymyx Rash    Social History:  reports that she has never smoked. She has never used smokeless tobacco. She reports that she does not drink alcohol or use illicit drugs.   Family History: Family History  Problem Relation Age of Onset  . Hypertension Father   . Cancer Father     Physical Exam: Filed Vitals:   02/14/12 1528 02/14/12 1615 02/14/12 1733 02/14/12 1908  BP: 110/52  153/90 164/61  Pulse: 67  84 88  Temp:  97.4  F (36.3 C)  97.4 F (36.3 C)  TempSrc:  Oral  Oral  Resp: 16  21 20   Height:    5\' 2"  (1.575 m)  Weight:    77.565 kg (171 lb)  SpO2: 96%  98% 97%   Constitutional: Vital signs reviewed.  Patient is a well-developed and well-nourished  in no acute distress and cooperative with exam. Alert and oriented x3.  Head: Normocephalic and atraumatic Mouth: no erythema or exudates, MMM Eyes: PERRL, EOMI,  conjunctivae normal, No scleral icterus.  Neck: Supple, Trachea midline normal ROM, No JVD, mass, thyromegaly, or carotid bruit present.  Cardiovascular: RRR, S1 normal, S2 normal, no MRG, pulses symmetric and intact bilaterally Pulmonary/Chest: CTAB, no wheezes, rales, or rhonchi Abdominal: Soft. Non-tender, non-distended, bowel sounds are normal, no masses, organomegaly, or guarding present.  Musculoskeletal: No joint deformities, erythema, or stiffness, ROM full tender left lower extremity, warm extremities. Pulses palpable.  Neurological: A&O x3, Strenght is normal and symmetric bilaterally, cranial nerve II-XII are grossly intact, no focal motor deficit, sensory intact to light touch bilaterally.  Skin: Warm, dry and intact. No rash, cyanosis, or clubbing.  Psychiatric: Normal mood and affect. l.      Labs on Admission:   [facility] 02/13/12 0919  NA 142  K 4.2  CL 104  CO2 22  GLUCOSE 90  BUN 28*  CREATININE 1.39*  CALCIUM 9.1  MG --  PHOS --    Basename 02/13/12 0919  AST 18  ALT 10  ALKPHOS 107  BILITOT 0.6  PROT 7.2  ALBUMIN 4.4   No results found for this basename: LIPASE:2,AMYLASE:2 in the last 72 hours  Basename 02/13/12 0919  WBC 6.4  NEUTROABS --  HGB 13.5  HCT 40.6  MCV 91.0  PLT 256   No results found for this basename: CKTOTAL:3,CKMB:3,CKMBINDEX:3,TROPONINI:3 in the last 72 hours  Basename 02/13/12 0919  TSH 2.387  T4TOTAL --  T3FREE --  THYROIDAB --   No results found for this basename: VITAMINB12:2,FOLATE:2,FERRITIN:2,TIBC:2,IRON:2,RETICCTPCT:2 in the last 72 hours  Radiological Exams on Admission: No results found.  Assessment/Plan Present on Admission:  .Atrial fibrillation: rate controlled. Not on any anticoagulation .  Marland KitchenLower limb ischemia; venous dopplers ordered. IV anticoagulation currently help pending cardiology consultation. 2D echocardiogram ordered to evaluate for cardiac source of emboli. Hypertension:  not well controlled.  Restart home medications.  .Chronic systolic congestive heart failure, NYHA class 3; appears to be compensated. Continue with lasix .Renal insufficiency; appears to be at baseline. Continue to monitor.       Time spent on this patient including examination and decision-making process: 65 minutes.  Shaolin Armas [phone] 02/14/2012, 7:25 PM

## 2012-02-14 NOTE — Progress Notes (Signed)
ANTICOAGULATION CONSULT NOTE - Initial Consult  Pharmacy Consult for Heparin Indication: leg ischemia, suspected DVT, hx Afib  Allergies  Allergen Reactions  . Latex Rash  . Neomycin-Bacitracin Zn-Polymyx Rash    Patient Measurements: Height: 5\' 2"  (157.5 cm) Weight: 171 lb (77.565 kg) IBW/kg (Calculated) : 50.1  Heparin Dosing Weight: 67.1kg  Vital Signs: Temp: 97.4 F (36.3 C) (05/18 1908) Temp src: Oral (05/18 1908) BP: 164/61 mmHg (05/18 1908) Pulse Rate: 88  (05/18 1908)  Labs:  Basename 02/14/12 2057 02/14/12 1807 02/13/12 0919  HGB -- -- 13.5  HCT -- -- 40.6  PLT -- -- 256  APTT -- 28 --  LABPROT -- 13.8 --  INR -- 1.04 --  HEPARINUNFRC -- -- --  CREATININE -- -- 1.39*  CKTOTAL 51 -- --  CKMB 2.1 -- --  TROPONINI <0.30 -- --    Estimated Creatinine Clearance: 36.3 ml/min (by C-G formula based on Cr of 1.39).   Medical History: Past Medical History  Diagnosis Date  . History of ventricular tachycardia   . DOE (dyspnea on exertion)   . Peripheral edema   . Aortic insufficiency     Moderate 2012  . 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 History of gout   . Exogenous obesity   . History of CHF (congestive heart failure)     EF 45% 2012, nonischemic (Normal coronaries 2002)  . Renal insufficiency   . 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 Hypertension   . Stroke     Medications:  Prescriptions prior to admission  Medication Sig Dispense Refill  . allopurinol (ZYLOPRIM) 100 MG tablet Take 100 mg by mouth as directed. 2 tablets daily       . aspirin 325 MG tablet Take 325 mg by mouth daily.        [name] BIOTIN PO Take 1 capsule by mouth daily.      . furosemide (LASIX) 40 MG tablet Take one tablet by mouth daily, except every third day take two tablets once daily  100 tablet  3  . losartan (COZAAR) 25 MG tablet Take 1 tablet (25 mg total) by mouth daily.  90 tablet  3  . potassium chloride  SA (K-DUR,KLOR-CON) 20 MEQ tablet Take 1 tablet (20 mEq total) by mouth daily.  90 tablet  3  . nitroGLYCERIN (NITROSTAT) 0.4 MG SL tablet Place 1 tablet (0.4 mg total) under the tongue every 5 (five) minutes as needed.  25 tablet  prn    Assessment: 71yof admitted for leg pain to start heparin for leg ischemia/suspected DVT. 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 Discussed with Dr. Antoine Poche [facility] for full-dose heparin (with boluses).  - Baseline INR: 1.04 - H/H and Plts wnl - No significant bleeding reported - CrCl 36 ml/min  Goal of Therapy:  Heparin level 0.3-0.7 units/ml Monitor platelets by anticoagulation protocol: Yes   Plan:  1. Heparin IV bolus 3500 units x 1 2. Heparin drip 1000 units/hr (10 ml/hr)  3. Check heparin level 8 hours after heparin initiated 4. Daily heparin level and CBC  Cleon Dew [phone] 02/14/2012,9:46 PM

## 2012-02-14 NOTE — ED Provider Notes (Signed)
History     CSN: [id number]  Arrival date & time 02/14/12  1524   First MD Initiated Contact with Patient 02/14/12 [phone]      Chief Complaint  Patient presents with  . Leg Pain     Patient is a 71 y.o. female presenting with leg pain. The history is provided by the patient and a relative.  Leg Pain  The incident occurred less than 1 hour ago. The incident occurred at home. There was no injury mechanism. Pain location: left knee/shin/foot. The pain is severe. The pain has been improving since onset. Associated symptoms include numbness, inability to bear weight and loss of sensation. The symptoms are aggravated by activity, bearing weight and palpation. She has tried heat for the symptoms. The treatment provided moderate relief.  Pt presents with abrupt onset of left LE pain from left knee distally No falls/trauma Reports it started suddenly, and pain was located in knee distally with numbness.  She reports while walking her left foot was numb on the plantar surface She denies new back pain  she denies abdominal pain No other weakness (she reports chronic weakness in right LE from previous CVA) No arm weakness No facial weakness She denies urinary retention/incontinence She does report recent loose stools but has had this previously Her symptoms are now improved and she can ambulate No new cp/sob reported Family reports that she was diaphoretic during pain episode  Past Medical History  Diagnosis Date  . History of ventricular tachycardia   . ICD (implantable cardiac defibrillator) in place   . DOE (dyspnea on exertion)   . Peripheral edema   . Aortic insufficiency   . 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 History of gout   . Exogenous obesity   . History of CHF (congestive heart failure)   . Renal insufficiency   . 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 Hypertension   . Stroke     Past Surgical History    Procedure Date  . Cardiac defibrillator placement   . Cardiac catheterization     Family History  Problem Relation Age of Onset  . Hypertension Father   . Cancer Father     History  Substance Use Topics  . Smoking status: Never Smoker   . Smokeless tobacco: Never Used  . Alcohol Use: No    OB History    Grav Para Term Preterm Abortions TAB SAB Ect Mult Living                  Review of Systems  Constitutional: Negative for fever.  Respiratory: Negative for shortness of breath.   Musculoskeletal: Negative for back pain.  Neurological: Positive for numbness.  All other systems reviewed and are negative.    Allergies  Latex and Neomycin-bacitracin zn-polymyx  Home Medications   Current Outpatient Rx  Name Route Sig Dispense Refill  . ALLOPURINOL 100 MG PO TABS Oral Take 100 mg by mouth as directed. 2 tablets daily     . ASPIRIN 325 MG PO TABS Oral Take 325 mg by mouth daily.      [name] BIOTIN PO Oral Take 1 capsule by mouth daily.    . FUROSEMIDE 40 MG PO TABS  Take one tablet by mouth daily, except every third day take two tablets once daily 100 tablet 3  . LOSARTAN POTASSIUM 25 MG PO TABS Oral Take 1 tablet (25 mg total) by mouth daily. 90 tablet 3  .  POTASSIUM CHLORIDE CRYS ER 20 MEQ PO TBCR Oral Take 1 tablet (20 mEq total) by mouth daily. 90 tablet 3  . NITROGLYCERIN 0.4 MG SL SUBL Sublingual Place 1 tablet (0.4 mg total) under the tongue every 5 (five) minutes as needed. 25 tablet prn    BP 110/52  Pulse 67  Temp(Src) 97.4 F (36.3 C) (Oral)  Resp 16  SpO2 96%  Physical Exam CONSTITUTIONAL: Well developed/well nourished HEAD AND FACE: Normocephalic/atraumatic EYES: EOMI/PERRL ENMT: Mucous membranes moist NECK: supple no meningeal signs SPINE:entire spine nontender CV: S1/S2 noted, no murmurs/rubs/gallops noted LUNGS: Lungs are clear to auscultation bilaterally, no apparent distress ABDOMEN: soft, nontender, no rebound or guarding GU:no cva  tenderness NEURO: Pt is awake/alert, moves all extremitiesx4 She can ambulate at baseline  no saddle anesthesia, rectal tone present (chaperone present),  hip flexion/knee flexion/extension, ankle dorsi/plantar flexion, great toe extension intact in left LE, no clonus , plantar reflex appropriate, no apparent sensory deficit Equal patellar/achilles reflex noted.  EXTREMITIES: pulses normal, full ROM.  Mild tenderness to left knee/ankle but no deformity, no assymetric edema noted, no erythema noted.  SKIN: warm, color normal.  The left foot is not cool to touch.  Pulses are not palpable in either foot but are found by doppler in each foot.  Femoral pulses equal bilaterally.   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  6:15 PM Seen by dr Edilia Bo, he feels she should be admitted and restarted on coumadin as likely limb ischemia that improved He will likely perform arteriogram in 1-2 days Pt agreeable, without complaints D/w triad dr Halina Andreas, will admit     MDM  Nursing notes reviewed and considered in documentation Previous records reviewed and considered        Date: 02/14/2012  Rate: 79  Rhythm: atrial fibrillation  QRS Axis: left  Intervals: normal  ST/T Wave abnormalities: nonspecific ST changes  Conduction Disutrbances:none  Narrative Interpretation:   Old EKG Reviewed: unchanged    Joya Gaskins, MD 02/14/12 1816

## 2012-02-14 NOTE — ED Notes (Addendum)
Patient complaining of "shooting", "sharp" pains in her left leg; patient also reports swelling in left leg that started 1300.  Patient reports history of blood clots in legs.  Patient denies any weakness; no facial droop present.  Patient does have history of gout.  Denies injury to leg.

## 2012-02-14 NOTE — ED Notes (Signed)
Vascular consult at bedside

## 2012-02-14 NOTE — Consult Note (Signed)
[facility]  Patient name: [name] MRN: [id number] DOB: 09-07-1941 Sex: female  REASON FOR CONSULT: Embolic disease of left lower extremity.  HPI: [name] is a 71 y.o. female who at approximately 2 PM today developed the sudden onset of severe pain in her left lower extremity. She also noted some paresthesias in her left foot at that time. The pain was from the left knee down. After approximately 30-45 minutes, the pain began to subside. She described the pain as a bursting type pain like that which would be associated with swelling. However she did not notice any lower extremity swelling.Of note she did take a pain pill which helped resolve her pain.She presented to the emergency department and by that time had minimal residual pain in the left lower extremity and no significant paresthesias. She had a palpable left femoral pulse with a monophasic Doppler signals in her left foot. Vascular surgery was consult for further recommendations.  Prior to this event, she denies any history of claudication although her activity is fairly limited. Likewise she denies any history of rest pain or nonhealing ulcers. She does state that her feet are cold a lot at that time.  Her history is otherwise significant for history of atrial fibrillation and she had been on Coumadin until she had a hemorrhagic stroke in 2000 associated with significant right sided weakness. Because of the hemorrhagic stroke her Coumadin was discontinued and she's never been on Coumadin since that time.  Past Medical History  Diagnosis Date  . History of ventricular tachycardia   . ICD (implantable cardiac defibrillator) in place   . DOE (dyspnea on exertion)   . Peripheral edema   . Aortic insufficiency   . 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 History of gout   . Exogenous obesity   . History of CHF (congestive heart failure)   . Renal insufficiency   .  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 Hypertension   . Stroke     Family History  Problem Relation Age of Onset  . Hypertension Father   . Cancer Father     SOCIAL HISTORY: History  Substance Use Topics  . Smoking status: Never Smoker   . Smokeless tobacco: Never Used  . Alcohol Use: No    Allergies  Allergen Reactions  . Latex Rash  . Neomycin-Bacitracin Zn-Polymyx Rash    No current facility-administered medications for this encounter.   Current Outpatient Prescriptions  Medication Sig Dispense Refill  . allopurinol (ZYLOPRIM) 100 MG tablet Take 100 mg by mouth as directed. 2 tablets daily       . aspirin 325 MG tablet Take 325 mg by mouth daily.        [name] BIOTIN PO Take 1 capsule by mouth daily.      . furosemide (LASIX) 40 MG tablet Take one tablet by mouth daily, except every third day take two tablets once daily  100 tablet  3  . losartan (COZAAR) 25 MG tablet Take 1 tablet (25 mg total) by mouth daily.  90 tablet  3  . potassium chloride SA (K-DUR,KLOR-CON) 20 MEQ tablet Take 1 tablet (20 mEq total) by mouth daily.  90 tablet  3  . nitroGLYCERIN (NITROSTAT) 0.4 MG SL tablet Place 1 tablet (0.4 mg total) under the tongue every 5 (five) minutes as needed.  25 tablet  prn  . DISCONTD: allopurinol (ZYLOPRIM) 100 MG tablet  Take 1 tablet (100 mg total) by mouth daily.  90 tablet  3    REVIEW OF SYSTEMS: Arly.Keller ] denotes positive finding; [  ] denotes negative finding CARDIOVASCULAR:  [ ]  chest pain   [ ]  chest pressure   [ ]  palpitations   Arly.Keller ] orthopnea   Arly.Keller ] dyspnea on exertion   [ ]  claudication   [ ]  rest pain   [ ]  DVT   [ ]  phlebitis PULMONARY:   [ ]  productive cough   [ ]  asthma   [ ]  wheezing NEUROLOGIC:   [ ]  weakness  Arly.Keller ] paresthesias- left foot, resolved  [ ]  aphasia  [ ]  amaurosis  [ ]  dizziness HEMATOLOGIC:   [ ]  bleeding problems   [ ]  clotting disorders MUSCULOSKELETAL:  [ ]  joint pain   [ ]  joint swelling [ ]  leg  swelling GASTROINTESTINAL: [ ]   blood in stool  [ ]   hematemesis GENITOURINARY:  [ ]   dysuria  [ ]   hematuria PSYCHIATRIC:  [ ]  history of major depression INTEGUMENTARY:  [ ]  rashes  [ ]  ulcers CONSTITUTIONAL:  [ ]  fever   [ ]  chills  PHYSICAL EXAM: Filed Vitals:   02/14/12 1528 02/14/12 1615 02/14/12 1733  BP: 110/52  153/90  Pulse: 67  84  Temp:  97.4 F (36.3 C)   TempSrc:  Oral   Resp: 16  21  SpO2: 96%  98%   There is no height or weight on file to calculate BMI. GENERAL: The patient is a well-nourished female, in no acute distress. The vital signs are documented above. CARDIOVASCULAR: There is a regular rate and rhythm without significant murmur appreciated. I do not detect carotid bruits. She has palpable femoral and popliteal pulses bilaterally. I cannot palpate pedal pulses. PULMONARY: There is good air exchange bilaterally without wheezing or rales. ABDOMEN: Soft and non-tender with normal pitched bowel sounds. I do not palpate an abdominal aortic aneurysm. MUSCULOSKELETAL: There are no major deformities or cyanosis. NEUROLOGIC: No focal weakness or paresthesias are detected. SKIN: There are no ulcers or rashes noted. PSYCHIATRIC: The patient has a normal affect.  DATA:  Lab Results  Component Value Date   WBC 6.4 02/13/2012   HGB 13.5 02/13/2012   HCT 40.6 02/13/2012   MCV 91.0 02/13/2012   PLT 256 02/13/2012   Lab Results  Component Value Date   NA 142 02/13/2012   K 4.2 02/13/2012   CL 104 02/13/2012   CO2 22 02/13/2012   Lab Results  Component Value Date   CREATININE 1.39* 02/13/2012   Lab Results  Component Value Date   INR 1.0 RATIO 07/12/2008   I did interrogate with the Doppler in the emergency department. On the left side, she has a monophasic anterior tibial, posterior tibial, and peroneal signal with the Doppler. Was unable to obtain a dorsalis pedis signal.  On the right side, she had a biphasic posterior tibial signal and dorsalis pedis  signal.  MEDICAL ISSUES:  TRANSIENT LEFT LEG PAIN (NOW RESOLVED): Based on her history of atrial fibrillation with the sudden onset of left leg pain and paresthesias of the left foot, I think that most likely she had some embolic disease to the left lower extremity. Given that her symptoms improved fairly quickly, and at this point her symptoms have essentially resolved, I suspect that she had a small embolus which has broken up. She may have some residual clot in her tibial vessels but has reasonable  Doppler flow in the left foot although diminished compared to the flow in the right foot. She has a palpable left femoral and popliteal pulse. Given this history, I would recommend that she be admitted for intravenous heparin and should undergo cardiac workup for her potential cardiac source of embolization. If in fact she had significant intracardiac clot, I think consideration could be given to placing her on Coumadin as her hemorrhagic stroke was 13 years ago. I think it would also be reasonable to proceed with arteriography on Monday to further assess the left lower extremity. I do not think that tibial embolectomy is indicated at this point given that she has reasonable flow in the left foot in her symptoms improve fairly quickly. This would suggest a very small clot burden and based on her Doppler exam the only absent signal is the dorsalis pedis signal. Explained that we could potentially make things worse in passing the catheter down her tibial vessels. In addition I think it would be reasonable to obtain a venous duplex to rule out DVT of the left lower extremity although I think this is unlikely. The only reason I would consider this is that she described a bursting type pain in the left leg "like the leg was very swollen."  Will follow.   Riva Sesma S [facility] [place] Beeper: [phone]

## 2012-02-14 NOTE — Consult Note (Signed)
CARDIOLOGY CONSULT NOTE  Patient ID: [name] MRN: [id number] DOB/AGE: 1941-07-03 71 y.o.  Admit date: 02/14/2012 Primary Physician [name], [name], MD Primary Cardiologist   Dr. Patty Sermons Chief Complaint  Leg Pain.  HPI:   We are called to discuss anticoagulation and to consider cardiac work up for a possible embolic source.  the patient was in her usual state of health until she had sudden severe left leg pain this am.  She felt like a balloon was blown up in the leg.  She was acutely diaphoretic.  She had no new SOB or chest pain.  She does not feel palpitations.  She had no presyncope or syncope.  In the ER Doppler pulses where diminished on the left foot.  Her acute pain lasted for 2 hours but it has eased.  The patient denies any new symptoms such as chest discomfort, neck or arm discomfort. There has been no new shortness of breath, PND or orthopnea.  She has chronic DOE and sleeps on two pillows.  She was seen by Dr Edilia Bo who suggests that she be started on full IV anticoagulation while we conduct further testing for a probable embolic source related to her atrial fibrillation.  The patient was quite concerned about this because of her past history of hemorrhagic CVA.    Her past cardiac problems include atrial fib, moderate AI and a mildly reduced EF.  She also had VTach with an ICD placed.  Her last echo in 11/2010 demonstrated severe LA dilatation, moderate AI and an EF of 45 - 50%.     Past Medical History  Diagnosis Date  . History of ventricular tachycardia   . DOE (dyspnea on exertion)   . Peripheral edema   . Aortic insufficiency     Moderate 2012  . 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 History of gout   . Exogenous obesity   . History of CHF (congestive heart failure)     EF 45% 2012, nonischemic (Normal coronaries 2002)  . Renal insufficiency   . 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  Hypertension   . Stroke     Past Surgical History  Procedure Date  . Cardiac defibrillator placement   . Cardiac catheterization     Allergies  Allergen Reactions  . Latex Rash  . Neomycin-Bacitracin Zn-Polymyx Rash   Prescriptions prior to admission  Medication Sig Dispense Refill  . allopurinol (ZYLOPRIM) 100 MG tablet Take 100 mg by mouth as directed. 2 tablets daily       . aspirin 325 MG tablet Take 325 mg by mouth daily.        [name] BIOTIN PO Take 1 capsule by mouth daily.      . furosemide (LASIX) 40 MG tablet Take one tablet by mouth daily, except every third day take two tablets once daily  100 tablet  3  . losartan (COZAAR) 25 MG tablet Take 1 tablet (25 mg total) by mouth daily.  90 tablet  3  . potassium chloride SA (K-DUR,KLOR-CON) 20 MEQ tablet Take 1 tablet (20 mEq total) by mouth daily.  90 tablet  3  . nitroGLYCERIN (NITROSTAT) 0.4 MG SL tablet Place 1 tablet (0.4 mg total) under the tongue every 5 (five) minutes as needed.  25 tablet  prn   Family History  Problem Relation Age of Onset  . Hypertension Father   . Cancer Father     History  Social History  . Marital Status: Divorced    Spouse Name: N/A    Number of Children: N/A  . Years of Education: N/A   Occupational History  . Not on file.   Social History Main Topics  . Smoking status: Never Smoker   . Smokeless tobacco: Never Used  . Alcohol Use: No  . Drug Use: No  . Sexually Active: Not on file   Other Topics Concern  . Not on file   Social History Narrative  . No narrative on file     ROS:  Easy brusing.  Otherwise as stated in the HPI and negative for all other systems.  Physical Exam: Blood pressure 164/61, pulse 88, temperature 97.4 F (36.3 C), temperature source Oral, resp. rate 20, height 5\' 2"  (1.575 m), weight 171 lb (77.565 kg), SpO2 97.00%.  GENERAL:  Well appearing HEENT:  Pupils equal round and reactive, fundi not visualized, oral mucosa unremarkable NECK:  No jugular  venous distention, waveform within normal limits, carotid upstroke brisk and symmetric, no bruits, no thyromegaly LYMPHATICS:  No cervical, inguinal adenopathy LUNGS:  Clear to auscultation bilaterally BACK:  No CVA tenderness CHEST:  Unremarkable HEART:  PMI not displaced or sustained,S1 and S2 within normal limits, no S3,  no clicks, no rubs, no murmurs, irregular ABD:  Flat, positive bowel sounds normal in frequency in pitch, no bruits, no rebound, no guarding, no midline pulsatile mass, no hepatomegaly, no splenomegaly EXT:  2 plus pulses upper, decreased DP/PT on the left, left foot cooler than the right, no edema, no cyanosis no clubbing SKIN:  No rashes no nodules NEURO:  Cranial nerves II through XII grossly intact, motor grossly intact throughout PSYCH:  Cognitively intact, oriented to person place and time   Labs: Lab Results  Component Value Date   BUN 28* 02/13/2012   Lab Results  Component Value Date   CREATININE 1.39* 02/13/2012   Lab Results  Component Value Date   NA 142 02/13/2012   K 4.2 02/13/2012   CL 104 02/13/2012   CO2 22 02/13/2012    Lab Results  Component Value Date   WBC 6.4 02/13/2012   HGB 13.5 02/13/2012   HCT 40.6 02/13/2012   MCV 91.0 02/13/2012   PLT 256 02/13/2012   Lab Results  Component Value Date   CHOL 193 02/13/2012   HDL 59 02/13/2012   LDLCALC 116* 02/13/2012   LDLDIRECT 152.6 09/01/2011   TRIG 92 02/13/2012   CHOLHDL 3.3 02/13/2012   Lab Results  Component Value Date   ALT 10 02/13/2012   AST 18 02/13/2012   ALKPHOS 107 02/13/2012   BILITOT 0.6 02/13/2012    EKG:  Atrial fibrillation, LAFB, LAD, old anteroseptal MI.  Lateral T wave inversion.  QT prolonged. 02/14/2012   ASSESSMENT AND PLAN:   1) Leg pain:  I agree that this was likely embolic.  She will be started on heparin.  We can check a TTE.  We could consider a TEE.  However, with atrial fib and LAE, even without evidence of clot, she will be high risk for further embolic events.   We will need to give further consideration to long term anticoagulation perhaps with an agent other than warfarin.  I have discussed this with the patient and her daughter.  2)  CHF/AI:  Reassess with echo.  We can up titrate as below.  3) HTN:  BP elevated today.   This has not been the case with the last three  readings in our office.  We can follow this and up titrate the ACE. We will need to discuss all med changes thoroughly with the patient.  4) RI:  Follow creat.     SignedRollene Rotunda 02/14/2012, 7:40 PM

## 2012-02-15 ENCOUNTER — Encounter (HOSPITAL_COMMUNITY)
Admission: EM | Disposition: A | Discharge status: Skilled Nursing Facility | Payer: Self-pay | Source: Home / Self Care | Attending: Internal Medicine

## 2012-02-15 ENCOUNTER — Inpatient Hospital Stay (HOSPITAL_COMMUNITY): Payer: Medicare Other

## 2012-02-15 ENCOUNTER — Encounter (HOSPITAL_COMMUNITY): Payer: Self-pay | Admitting: *Deleted

## 2012-02-15 ENCOUNTER — Inpatient Hospital Stay (HOSPITAL_COMMUNITY): Payer: Medicare Other | Admitting: Anesthesiology

## 2012-02-15 ENCOUNTER — Encounter (HOSPITAL_COMMUNITY): Payer: Self-pay | Admitting: Anesthesiology

## 2012-02-15 DIAGNOSIS — I359 Nonrheumatic aortic valve disorder, unspecified: Secondary | ICD-10-CM

## 2012-02-15 DIAGNOSIS — I10 Essential (primary) hypertension: Secondary | ICD-10-CM

## 2012-02-15 DIAGNOSIS — N289 Disorder of kidney and ureter, unspecified: Secondary | ICD-10-CM

## 2012-02-15 DIAGNOSIS — I999 Unspecified disorder of circulatory system: Secondary | ICD-10-CM

## 2012-02-15 DIAGNOSIS — M79609 Pain in unspecified limb: Secondary | ICD-10-CM

## 2012-02-15 DIAGNOSIS — I5022 Chronic systolic (congestive) heart failure: Secondary | ICD-10-CM

## 2012-02-15 HISTORY — PX: EMBOLECTOMY: SHX44

## 2012-02-15 LAB — CBC
HCT: 33.1 % — ABNORMAL LOW (ref 36.0–46.0)
Hemoglobin: 12.7 g/dL (ref 12.0–15.0)
Hemoglobin: 10.8 g/dL — ABNORMAL LOW (ref 12.0–15.0)
MCH: 30.3 pg (ref 26.0–34.0)
MCH: 30.2 pg (ref 26.0–34.0)
MCH: 30.3 pg (ref 26.0–34.0)
MCHC: 33.1 g/dL (ref 30.0–36.0)
MCHC: 32.6 g/dL (ref 30.0–36.0)
MCHC: 32.6 g/dL (ref 30.0–36.0)
Platelets: 213 10*3/uL (ref 150–400)
Platelets: 207 10*3/uL (ref 150–400)
RBC: 4.13 MIL/uL (ref 3.87–5.11)
RBC: 3.56 MIL/uL — ABNORMAL LOW (ref 3.87–5.11)

## 2012-02-15 LAB — COMPREHENSIVE METABOLIC PANEL
Alkaline Phosphatase: 101 U/L (ref 39–117)
BUN: 29 mg/dL — ABNORMAL HIGH (ref 6–23)
GFR calc Af Amer: 53 mL/min — ABNORMAL LOW (ref >90)
Glucose, Bld: 86 mg/dL (ref 70–99)
Potassium: 3.7 mEq/L (ref 3.5–5.1)
Total Protein: 6.5 g/dL (ref 6.0–8.3)

## 2012-02-15 LAB — APTT: aPTT: 29 seconds (ref 24–37)

## 2012-02-15 LAB — CARDIAC PANEL(CRET KIN+CKTOT+MB+TROPI)
CK, MB: 2.4 ng/mL (ref 0.3–4.0)
Relative Index: INVALID (ref 0.0–2.5)
Total CK: 48 U/L (ref 7–177)
Total CK: 49 U/L (ref 7–177)

## 2012-02-15 LAB — DIFFERENTIAL
Basophils Relative: 1 % (ref 0–1)
Eosinophils Absolute: 0.2 10*3/uL (ref 0.0–0.7)
Neutrophils Relative %: 50 % (ref 43–77)

## 2012-02-15 LAB — PROTIME-INR: Prothrombin Time: 14 seconds (ref 11.6–15.2)

## 2012-02-15 LAB — MRSA PCR SCREENING: MRSA by PCR: NEGATIVE

## 2012-02-15 LAB — MAGNESIUM: Magnesium: 2.4 mg/dL (ref 1.5–2.5)

## 2012-02-15 SURGERY — EMBOLECTOMY
Anesthesia: General | Site: Leg Lower | Laterality: Left | Wound class: Clean

## 2012-02-15 MED ORDER — GLYCOPYRROLATE 0.2 MG/ML IJ SOLN
INTRAMUSCULAR | Status: DC | PRN
  Administered 2012-02-15: .6 mg via INTRAVENOUS

## 2012-02-15 MED ORDER — HYDROMORPHONE HCL PF 1 MG/ML IJ SOLN
0.2500 mg | INTRAMUSCULAR | Status: DC | PRN
  Administered 2012-02-15: 0.25 mg via INTRAVENOUS

## 2012-02-15 MED ORDER — THROMBIN 20000 UNITS EX KIT
PACK | CUTANEOUS | Status: DC | PRN
  Administered 2012-02-15: 16:00:00 via TOPICAL

## 2012-02-15 MED ORDER — ALBUMIN HUMAN 5 % IV SOLN
INTRAVENOUS | Status: DC | PRN
  Administered 2012-02-15 (×3): via INTRAVENOUS

## 2012-02-15 MED ORDER — ALPRAZOLAM 0.5 MG PO TABS: 0.5000 mg | ORAL_TABLET | Freq: Four times a day (QID) | ORAL | Status: DC | PRN

## 2012-02-15 MED ORDER — ROCURONIUM BROMIDE 100 MG/10ML IV SOLN
INTRAVENOUS | Status: DC | PRN
  Administered 2012-02-15: 20 mg via INTRAVENOUS
  Administered 2012-02-15: 30 mg via INTRAVENOUS

## 2012-02-15 MED ORDER — HEPARIN (PORCINE) IN NACL 100-0.45 UNIT/ML-% IJ SOLN
1250.0000 [IU]/h | INTRAMUSCULAR | Status: DC
  Administered 2012-02-15: 1250 [IU]/h via INTRAVENOUS
  Filled 2012-02-15 (×3): qty 250

## 2012-02-15 MED ORDER — LACTATED RINGERS IV SOLN
INTRAVENOUS | Status: DC | PRN
  Administered 2012-02-15: 13:00:00 via INTRAVENOUS

## 2012-02-15 MED ORDER — 0.9 % SODIUM CHLORIDE (POUR BTL) OPTIME
TOPICAL | Status: DC | PRN
  Administered 2012-02-15: 1000 mL

## 2012-02-15 MED ORDER — CEFAZOLIN SODIUM 1-5 GM-% IV SOLN
INTRAVENOUS | Status: AC
  Filled 2012-02-15: qty 50

## 2012-02-15 MED ORDER — DROPERIDOL 2.5 MG/ML IJ SOLN: 0.6250 mg | INTRAMUSCULAR | Status: DC | PRN

## 2012-02-15 MED ORDER — PROTAMINE SULFATE 10 MG/ML IV SOLN
INTRAVENOUS | Status: DC | PRN
  Administered 2012-02-15: 60 mg via INTRAVENOUS

## 2012-02-15 MED ORDER — FENTANYL CITRATE 0.05 MG/ML IJ SOLN
INTRAMUSCULAR | Status: DC | PRN
  Administered 2012-02-15: 25 ug via INTRAVENOUS
  Administered 2012-02-15 (×2): 100 ug via INTRAVENOUS
  Administered 2012-02-15: 25 ug via INTRAVENOUS

## 2012-02-15 MED ORDER — MORPHINE SULFATE 2 MG/ML IJ SOLN
2.0000 mg | INTRAMUSCULAR | Status: DC | PRN
  Administered 2012-02-15 – 2012-02-19 (×6): 2 mg via INTRAVENOUS
  Filled 2012-02-15 (×6): qty 1

## 2012-02-15 MED ORDER — HEPARIN BOLUS VIA INFUSION
2000.0000 [IU] | Freq: Once | INTRAVENOUS | Status: AC
  Administered 2012-02-15: 2000 [IU] via INTRAVENOUS
  Filled 2012-02-15: qty 2000

## 2012-02-15 MED ORDER — SUCCINYLCHOLINE CHLORIDE 20 MG/ML IJ SOLN
INTRAMUSCULAR | Status: DC | PRN
  Administered 2012-02-15: 100 mg via INTRAVENOUS

## 2012-02-15 MED ORDER — CEFAZOLIN SODIUM 1-5 GM-% IV SOLN
1.0000 g | INTRAVENOUS | Status: AC
  Administered 2012-02-15: 1 g via INTRAVENOUS
  Filled 2012-02-15: qty 50

## 2012-02-15 MED ORDER — SODIUM CHLORIDE 0.9 % IR SOLN
Status: DC | PRN
  Administered 2012-02-15: 16:00:00

## 2012-02-15 MED ORDER — LIDOCAINE HCL (CARDIAC) 20 MG/ML IV SOLN
INTRAVENOUS | Status: DC | PRN
  Administered 2012-02-15: 100 mg via INTRAVENOUS

## 2012-02-15 MED ORDER — NEOSTIGMINE METHYLSULFATE 1 MG/ML IJ SOLN
INTRAMUSCULAR | Status: DC | PRN
  Administered 2012-02-15: 4 mg via INTRAVENOUS

## 2012-02-15 MED ORDER — HEPARIN SODIUM (PORCINE) 1000 UNIT/ML IJ SOLN
INTRAMUSCULAR | Status: DC | PRN
  Administered 2012-02-15: 8000 [IU] via INTRAVENOUS

## 2012-02-15 MED ORDER — PHENYLEPHRINE HCL 10 MG/ML IJ SOLN
INTRAMUSCULAR | Status: DC | PRN
  Administered 2012-02-15 (×2): 120 ug via INTRAVENOUS
  Administered 2012-02-15 (×2): 80 ug via INTRAVENOUS
  Administered 2012-02-15: 120 ug via INTRAVENOUS
  Administered 2012-02-15: 80 ug via INTRAVENOUS
  Administered 2012-02-15: 120 ug via INTRAVENOUS
  Administered 2012-02-15 (×2): 80 ug via INTRAVENOUS

## 2012-02-15 MED ORDER — SODIUM CHLORIDE 0.9 % IV SOLN
INTRAVENOUS | Status: DC
  Administered 2012-02-15 – 2012-02-16 (×2): via INTRAVENOUS

## 2012-02-15 MED ORDER — CEFAZOLIN SODIUM 1-5 GM-% IV SOLN: 1.0000 g | INTRAVENOUS | Status: DC

## 2012-02-15 MED ORDER — MIDAZOLAM HCL 5 MG/5ML IJ SOLN
INTRAMUSCULAR | Status: DC | PRN
  Administered 2012-02-15: 2 mg via INTRAVENOUS

## 2012-02-15 MED ORDER — MORPHINE SULFATE 4 MG/ML IJ SOLN: 4.0000 mg | INTRAMUSCULAR | Status: DC | PRN

## 2012-02-15 MED ORDER — ETOMIDATE 2 MG/ML IV SOLN
INTRAVENOUS | Status: DC | PRN
  Administered 2012-02-15: 20 mg via INTRAVENOUS

## 2012-02-15 MED ORDER — LACTATED RINGERS IV SOLN
INTRAVENOUS | Status: DC | PRN
  Administered 2012-02-15: 14:00:00 via INTRAVENOUS

## 2012-02-15 MED ORDER — LOSARTAN POTASSIUM 50 MG PO TABS
50.0000 mg | ORAL_TABLET | Freq: Every day | ORAL | Status: DC
  Administered 2012-02-16 – 2012-02-20 (×5): 50 mg via ORAL
  Filled 2012-02-15 (×6): qty 1

## 2012-02-15 SURGICAL SUPPLY — 60 items
ADH SKN CLS APL DERMABOND .7 (GAUZE/BANDAGES/DRESSINGS) ×1
BANDAGE ESMARK 6X9 LF (GAUZE/BANDAGES/DRESSINGS) IMPLANT
BNDG CMPR 9X6 STRL LF SNTH (GAUZE/BANDAGES/DRESSINGS) ×1
BNDG ESMARK 6X9 LF (GAUZE/BANDAGES/DRESSINGS) ×2
BOOT SUTURE AID YELLOW STND (SUTURE) ×1 IMPLANT
CANISTER SUCTION 2500CC (MISCELLANEOUS) ×2 IMPLANT
CATH EMB 2FR 60CM (CATHETERS) ×1 IMPLANT
CATH EMB 3FR 80CM (CATHETERS) ×1 IMPLANT
CATH EMB 4FR 80CM (CATHETERS) ×1 IMPLANT
CATH EMB 5FR 80CM (CATHETERS) IMPLANT
CLIP TI MEDIUM 24 (CLIP) ×3 IMPLANT
CLIP TI WIDE RED SMALL 24 (CLIP) ×3 IMPLANT
CLOTH BEACON ORANGE TIMEOUT ST (SAFETY) ×2 IMPLANT
COVER SURGICAL LIGHT HANDLE (MISCELLANEOUS) ×4 IMPLANT
CUFF TOURNIQUET SINGLE 18IN (TOURNIQUET CUFF) IMPLANT
CUFF TOURNIQUET SINGLE 24IN (TOURNIQUET CUFF) IMPLANT
CUFF TOURNIQUET SINGLE 34IN LL (TOURNIQUET CUFF) ×1 IMPLANT
CUFF TOURNIQUET SINGLE 44IN (TOURNIQUET CUFF) IMPLANT
DERMABOND ADVANCED (GAUZE/BANDAGES/DRESSINGS) ×1
DERMABOND ADVANCED .7 DNX12 (GAUZE/BANDAGES/DRESSINGS) ×1 IMPLANT
DRAIN CHANNEL 15F RND FF W/TCR (WOUND CARE) ×1 IMPLANT
DRAPE WARM FLUID 44X44 (DRAPE) ×2 IMPLANT
DRAPE X-RAY CASS 24X20 (DRAPES) IMPLANT
DRSG COVADERM 4X10 (GAUZE/BANDAGES/DRESSINGS) ×1 IMPLANT
DRSG COVADERM 4X8 (GAUZE/BANDAGES/DRESSINGS) ×1 IMPLANT
ELECT REM PT RETURN 9FT ADLT (ELECTROSURGICAL) ×2
ELECTRODE REM PT RTRN 9FT ADLT (ELECTROSURGICAL) ×1 IMPLANT
EVACUATOR SILICONE 100CC (DRAIN) ×1 IMPLANT
GLOVE BIO SURGEON STRL SZ7.5 (GLOVE) ×2 IMPLANT
GLOVE BIOGEL PI IND STRL 7.5 (GLOVE) ×1 IMPLANT
GLOVE BIOGEL PI INDICATOR 7.5 (GLOVE) ×1
GOWN STRL NON-REIN LRG LVL3 (GOWN DISPOSABLE) ×4 IMPLANT
KIT BASIN OR (CUSTOM PROCEDURE TRAY) ×2 IMPLANT
KIT ROOM TURNOVER OR (KITS) ×2 IMPLANT
LOOP VESSEL MAXI BLUE (MISCELLANEOUS) ×1 IMPLANT
LOOP VESSEL MINI RED (MISCELLANEOUS) ×1 IMPLANT
NS IRRIG 1000ML POUR BTL (IV SOLUTION) ×4 IMPLANT
PACK PERIPHERAL VASCULAR (CUSTOM PROCEDURE TRAY) ×1 IMPLANT
PAD ARMBOARD 7.5X6 YLW CONV (MISCELLANEOUS) ×3 IMPLANT
PATCH VASCULAR VASCU GUARD 1X6 (Vascular Products) ×1 IMPLANT
SET COLLECT BLD 21X3/4 12 (NEEDLE) IMPLANT
SPONGE GAUZE 4X4 12PLY (GAUZE/BANDAGES/DRESSINGS) ×2 IMPLANT
SPONGE SURGIFOAM ABS GEL 100 (HEMOSTASIS) ×1 IMPLANT
STAPLER VISISTAT 35W (STAPLE) ×1 IMPLANT
STOPCOCK 4 WAY LG BORE MALE ST (IV SETS) IMPLANT
SUT PROLENE 5 0 C 1 24 (SUTURE) ×2 IMPLANT
SUT PROLENE 6 0 BV (SUTURE) ×5 IMPLANT
SUT SILK 2 0 TIES 10X30 (SUTURE) ×1 IMPLANT
SUT SILK 3 0 TIES 10X30 (SUTURE) ×1 IMPLANT
SUT VIC AB 2-0 CTB1 (SUTURE) ×2 IMPLANT
SUT VIC AB 3-0 SH 27 (SUTURE) ×2
SUT VIC AB 3-0 SH 27X BRD (SUTURE) ×1 IMPLANT
SUT VICRYL 4-0 PS2 18IN ABS (SUTURE) ×2 IMPLANT
SYR 3ML LL SCALE MARK (SYRINGE) ×3 IMPLANT
TOWEL OR 17X24 6PK STRL BLUE (TOWEL DISPOSABLE) ×5 IMPLANT
TOWEL OR 17X26 10 PK STRL BLUE (TOWEL DISPOSABLE) ×3 IMPLANT
TRAY FOLEY CATH 14FRSI W/METER (CATHETERS) ×2 IMPLANT
TUBING EXTENTION W/L.L. (IV SETS) IMPLANT
UNDERPAD 30X30 INCONTINENT (UNDERPADS AND DIAPERS) ×2 IMPLANT
WATER STERILE IRR 1000ML POUR (IV SOLUTION) ×1 IMPLANT

## 2012-02-15 NOTE — Progress Notes (Addendum)
VASCULAR PROGRESS NOTE   SUBJECTIVE: No significant pain left foot  PHYSICAL EXAM: Filed Vitals:   02/14/12 1908 02/14/12 2256 02/15/12 0303 02/15/12 0452  BP: 164/61 171/55 150/33 154/49  Pulse: 88 79 70 78  Temp: 97.4 F (36.3 C) 97.4 F (36.3 C) 97.7 F (36.5 C) 97.4 F (36.3 C)  TempSrc: Oral Oral Oral Oral  Resp: 20 20 18    Height: 5\' 2"  (1.575 m)     Weight: 171 lb (77.565 kg)   181 lb 7 oz (82.3 kg)  SpO2: 97% 96% 97% 96%   Palpable left femoral and popliteal pulse.  Monophasic  doppler flow left foot (DP, ATA, PT, & Per)  LABS: Lab Results  Component Value Date   WBC 6.0 02/15/2012   HGB 12.7 02/15/2012   HCT 38.9 02/15/2012   MCV 92.4 02/15/2012   PLT 207 02/15/2012   Lab Results  Component Value Date   CREATININE 1.17* 02/15/2012   Lab Results  Component Value Date   INR 1.06 02/15/2012   CBG (last 3)  No results found for this basename: GLUCAP:3 in the last 72 hours   ASSESSMENT/PLAN: 1. Likely had an embolic event to the left leg, but symptoms resolved, suggesting small clot burden. Admitted for IV heparin. H/O A. Fib, not on coumadin bc/ of hemorraghic stroke in 2000. 2-D Echo pending. Venous Duplex pending.  Will proceed with angiogram tomorrow to further evaluate left LE. Will hold heparin in AM prior to Angio. I have reviewed with the patient the indications for arteriography. In addition, I have reviewed the potential complications of arteriography including but not limited to: Bleeding, arterial injury, arterial thrombosis, dye action, renal insufficiency, or other unpredictable medical problems.   Waverly Ferrari, MD, FACS Beeper: [phone] 02/15/2012  ADDENDUM: Duplex reviewed: this shows thrombus in mid to distal popliteal artery. I have recommended popliteal embolectomy today. Given her multiple cardiac issues, she is at increased risk with surgery, but this is potentially a limb threatening problem. Discussed case with Dr. Johney Frame, he agrees that it  is reasonable to proceed with the understanding that she is at increased risk for periop cardiac complications. Will obviously try to keep things as simple as possible.  Di Kindle. Edilia Bo, MD, FACS Beeper [phone] 02/15/2012

## 2012-02-15 NOTE — Interval H&P Note (Signed)
History and Physical Interval Note:  02/15/2012 12:42 PM  [name]  has presented today for surgery, with the diagnosis of ischemic left leg  The various methods of treatment have been discussed with the patient and family. After consideration of risks, benefits and other options for treatment, the patient has consented to: LEFT POPLITEAL EMBOLECTOMY.  The patients' history has been reviewed, patient examined, no change in status, stable for surgery.  I have reviewed the patients' chart and labs.  Questions were answered to the patient's satisfaction.     Kenzy Campoverde S

## 2012-02-15 NOTE — Anesthesia Preprocedure Evaluation (Addendum)
Anesthesia Evaluation    Airway Mallampati: II TM Distance: >3 FB Neck ROM: Full    Dental  (+) Teeth Intact and Dental Advisory Given   Pulmonary shortness of breath and at rest,  breath sounds clear to auscultation  Pulmonary exam normal       Cardiovascular hypertension, Pt. on medications +CHF and + DOE + dysrhythmias Atrial Fibrillation Rhythm:Regular Rate:Tachycardia + Systolic murmurs    Neuro/Psych CVA (right sided weakness), Residual Symptoms    GI/Hepatic negative GI ROS, Neg liver ROS,   Endo/Other  negative endocrine ROS  Renal/GU Renal InsufficiencyRenal disease     Musculoskeletal   Abdominal   Peds  Hematology   Anesthesia Other Findings   Reproductive/Obstetrics                          Anesthesia Physical Anesthesia Plan  ASA: III  Anesthesia Plan: General   Post-op Pain Management:    Induction: Intravenous  Airway Management Planned: Oral ETT  Additional Equipment:   Intra-op Plan:   Post-operative Plan: Possible Post-op intubation/ventilation  Informed Consent: I have reviewed the patients History and Physical, chart, labs and discussed the procedure including the risks, benefits and alternatives for the proposed anesthesia with the patient or authorized representative who has indicated his/her understanding and acceptance.   Dental advisory given  Plan Discussed with: CRNA, Anesthesiologist and Surgeon  Anesthesia Plan Comments: (Pt has Significant SOB and may require post op vent.  This was discussed with pt and family)        Anesthesia Quick Evaluation

## 2012-02-15 NOTE — Progress Notes (Signed)
ANTICOAGULATION CONSULT NOTE - Follow Up Consult  Pharmacy Consult for Heparin Indication: DVT s/p L poptileal and tibial embolectomy  Allergies  Allergen Reactions  . Latex Rash  . Neomycin-Bacitracin Zn-Polymyx Rash    Patient Measurements: Height: 5\' 2"  (157.5 cm) Weight: 181 lb 7 oz (82.3 kg) (Bed wt.) IBW/kg (Calculated) : 50.1  Heparin Dosing Weight: 67.1kg  Vital Signs: Temp: 97.1 F (36.2 C) (05/19 1631) BP: 153/59 mmHg (05/19 1707) Pulse Rate: 91  (05/19 1707)  Labs:  Basename 02/15/12 1230 02/15/12 0630 02/15/12 0502 02/14/12 2057 02/14/12 1807 02/13/12 0919  HGB -- 12.7 12.5 -- -- --  HCT -- 38.9 37.8 -- -- 40.6  PLT -- 207 213 -- -- 256  APTT -- -- 29 -- 28 --  LABPROT -- -- 14.0 -- 13.8 --  INR -- -- 1.06 -- 1.04 --  HEPARINUNFRC -- <0.10* -- -- -- --  CREATININE -- -- 1.17* -- -- 1.39*  CKTOTAL 49 -- 48 51 -- --  CKMB 2.5 -- 2.4 2.1 -- --  TROPONINI <0.30 -- <0.30 <0.30 -- --    Estimated Creatinine Clearance: 44.5 ml/min (by C-G formula based on Cr of 1.17).  Assessment: 72 yof with DVT s/p left popliteal and tibial embolectomy 5/19. Pt to resume heparin post-op at 2200. 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 Per Dr. Antoine Poche, [facility] for full-dose heparin (with boluses).  Goal of Therapy:  Heparin level 0.3-0.7 units/ml Monitor platelets by anticoagulation protocol: Yes   Plan:  1. Begin heparin gtt at 1250 units/hr at 2200. No bolus. 2. F/u a.m. Heparin level and CBC (will retime for 0600)  Christoper Fabian, PharmD, BCPS Clinical pharmacist, pager [phone] 02/15/2012,5:23 PM

## 2012-02-15 NOTE — Progress Notes (Signed)
  Echocardiogram 2D Echocardiogram has been performed.  [name], Real Cons 02/15/2012, 11:20 AM

## 2012-02-15 NOTE — Transfer of Care (Signed)
Immediate Anesthesia Transfer of Care Note  Patient: [name]  Procedure(s) Performed: Procedure(s) (LRB): EMBOLECTOMY (Left)  Patient Location: PACU  Anesthesia Type: General  Level of Consciousness: responds to stimulation  Airway & Oxygen Therapy: Patient remains intubated per anesthesia plan and Patient placed on Ventilator (see vital sign flow sheet for setting)  Post-op Assessment: Report given to PACU RN and Post -op Vital signs reviewed and stable  Post vital signs: Reviewed and stable  Complications: No apparent anesthesia complications

## 2012-02-15 NOTE — Op Note (Signed)
NAME: [name]   MRN: [id number] DOB: 02/23/1941    DATE OF OPERATION: 02/15/2012  PREOP DIAGNOSIS: left popliteal artery embolus  POSTOP DIAGNOSIS: same  PROCEDURE: left popliteal and tibial embolectomy with bovine pericardial patch angioplasty of the popliteal and posterior tibial artery.  SURGEON: Di Kindle. Edilia Bo, MD, FACS  ASSIST: none  ANESTHESIA: enteral   EBL: 500 cc  INDICATIONS: [name] is a 71 y.o. female who had developed the sudden onset of paresthesias of the left foot and some pain in the left foot which resolved fairly quickly. Duplex scan showed clot in the popliteal artery. She is brought to the operating room for popliteal embolectomy. The procedure and potential complications were discussed with the patient preoperatively.  FINDINGS: there was organized clot in the popliteal artery and also some clot down the posterior tibial and peroneal arteries.  TECHNIQUE: The patient was brought to the operative room and received a general anesthetic. The left lower extremity was prepped and draped in the usual sterile fashion. A longitudinal incision was made in the medial aspect of the left leg and through this incision the gastrocnemius muscle was retracted posteriorly and the popliteal fossa entered. The popliteal vein was retracted inferiorly allowing exposure of the popliteal artery which was noncompressible consistent with an embolus at this level. In order to be able to direct the catheter down each of the tibial vessels I then ligated the anterior tibial vein and divided this allowing exposure of the anterior tibial artery tibial peroneal trunk and posterior tibial artery. There was significant venous branches around the tibial vessels and the patient developed venous bleeding from a branch posterior to the anterior tibial artery making exposure difficult. Therefore elected to use a tourniquet. The patient was heparinized. Tourniquet was placed on the upper  thigh. The leg was exsanguinated with an Esmarch bandage and the tourniquet inflated to 300 mm mercury. Under tourniquet control a longitudinal arteriotomy was made in the popliteal artery. In order to allow me to direct the Fogarty catheter down into the tibial vessels I extended this onto the posterior tibial artery. The #4 Fogarty catheter was directed proximally and an organized clot was retrieved. The catheter was passed until no further clot was retrieved. Next a 4 Fogarty catheter was directed down the anterior tibial peroneal and posterior tibial arteries. The peroneal artery was patent although the catheter passed only approximately 25 mm. The catheter passed to the ankle through the anterior tibial artery. Posterior tibial artery was very small but also appeared to be patent. Once no further clot was retrieved a bovine pericardial patch was tailored and sewn end to side to the popliteal artery and extending onto the posterior tibial artery using continuous 6-0 Prolene suture. I did not complete the anastomosis at the superior aspect of the arteriotomy so that I could release the tourniquet and passed a Fogarty proximally to be sure there was no further proximal clot. Vessel loop was used to control the distal vessels and then the tourniquet was released. The Fogarty catheter was passed proximally and no further clot was retrieved. The anastomosis was then completed. Had repaired some venous branches with 6-0 Prolene was continued venous bleeding. The area of venous injury was identified and I reinflated the tourniquet for better exposure and this was controlled with a running 6-0 Prolene suture. Tourniquet was then released. The heparin was partially reversed with protamine. There was a good anterior tibial and posterior tibial and peroneal signal with the Doppler at the  completion. A 15 Blake drain was placed. The wounds closed the deep layer 2-0 Vicryl. The subcutaneous layer was closed with 3-0 Vicryl.  The skin was closed with 4-0 subcuticular stitch. Sterile dressing was applied. Mild compression was applied with a four-inch Ace. The patient tolerated she's well and was transferred to the recovery room in stable condition. All needle and sponge counts were correct.  Waverly Ferrari, MD, FACS [facility]  DATE OF DICTATION:   02/15/2012

## 2012-02-15 NOTE — Progress Notes (Signed)
SUBJECTIVE: The patient is doing well today.  She has SOB with moderate activity but denies chest pain.     [name] allopurinol  200 mg Oral Daily  . aspirin  325 mg Oral Daily  . furosemide  40 mg Oral Daily  . heparin  2,000 Units Intravenous Once  . heparin  3,500 Units Intravenous Once  . losartan  25 mg Oral Daily  . potassium chloride SA  20 mEq Oral Daily  . sodium chloride  3 mL Intravenous Q12H  . DISCONTD: enoxaparin  40 mg Subcutaneous QHS      . heparin 1,000 Units/hr (02/14/12 2241)    OBJECTIVE: Physical Exam: Filed Vitals:   02/14/12 1908 02/14/12 2256 02/15/12 0303 02/15/12 0452  BP: 164/61 171/55 150/33 154/49  Pulse: 88 79 70 78  Temp: 97.4 F (36.3 C) 97.4 F (36.3 C) 97.7 F (36.5 C) 97.4 F (36.3 C)  TempSrc: Oral Oral Oral Oral  Resp: 20 20 18    Height: 5\' 2"  (1.575 m)     Weight: 171 lb (77.565 kg)   181 lb 7 oz (82.3 kg)  SpO2: 97% 96% 97% 96%    Intake/Output Summary (Last 24 hours) at 02/15/12 0948 Last data filed at 02/15/12 0856  Gross per 24 hour  Intake    120 ml  Output      0 ml  Net    120 ml    Telemetry reveals afib  GEN- The patient is obese appearing, alert and oriented x 3 today.   Head- normocephalic, atraumatic Eyes-  Sclera clear, conjunctiva pink Ears- hearing intact Oropharynx- clear Neck- supple   Lungs- Clear to ausculation bilaterally, normal work of breathing Heart- irregular rate and rhythm,  GI- soft, NT, ND, + BS Extremities- no clubbing, cyanosis, or edema, L leg is cool, DP/PT pulses are diminished   LABS: Basic Metabolic Panel:  Basename 02/15/12 0502 02/13/12 0919  NA 141 142  K 3.7 4.2  CL 104 104  CO2 22 22  GLUCOSE 86 90  BUN 29* 28*  CREATININE 1.17* 1.39*  CALCIUM 9.0 9.1  MG 2.4 --  PHOS 4.6 --   Liver Function Tests:  Basename 02/15/12 0502 02/13/12 0919  AST 19 18  ALT 11 10  ALKPHOS 101 107  BILITOT 0.5 0.6  PROT 6.5 7.2  ALBUMIN 3.4* 4.4   No results found for this  basename: LIPASE:2,AMYLASE:2 in the last 72 hours CBC:  Basename 02/15/12 0630 02/15/12 0502  WBC 6.0 6.3  NEUTROABS -- 3.1  HGB 12.7 12.5  HCT 38.9 37.8  MCV 92.4 91.5  PLT 207 213   Cardiac Enzymes:  Basename 02/15/12 0502 02/14/12 2057  CKTOTAL 48 51  CKMB 2.4 2.1  CKMBINDEX -- --  TROPONINI <0.30 <0.30   BNP: No components found with this basename: POCBNP:3 D-Dimer: No results found for this basename: DDIMER:2 in the last 72 hours Hemoglobin A1C: No results found for this basename: HGBA1C in the last 72 hours Fasting Lipid Panel:  Basename 02/13/12 0919  CHOL 193  HDL 59  LDLCALC 116*  TRIG 92  CHOLHDL 3.3  LDLDIRECT --   Thyroid Function Tests:  Basename 02/13/12 0919  TSH 2.387  T4TOTAL --  T3FREE --  THYROIDAB --   Anemia Panel: No results found for this basename: VITAMINB12,FOLATE,FERRITIN,TIBC,IRON,RETICCTPCT in the last 72 hours  RADIOLOGY: No results found.  ASSESSMENT AND PLAN:  Active Problems:  HYPERTENSION  Atrial fibrillation  Lower limb ischemia  Chronic systolic congestive  heart failure, NYHA class 3  Renal insufficiency   1) Leg pain (likely embolic with afib):  Dr Edilia Bo to perform angiogram in am.  Continue heparin gtt until then. It is very clear that she has indication for long term anticoagulation with either coumadin or a novel anticoagulant.  TEE would not change that decision and therefore I would not advise TEE at this point. Given significant risk reduction in stroke/ emboli in afib with pradaxa compared to warfarin as well as significant reduction in intercranial bleed with pradaxa, I think that the data would clearly support pradaxa over warfarin in this patient. Once her angiogram is complete, I would recommend that we start pradaxa 150mg  BID without heparin overlap post procedure.  If OK with Dr Edilia Bo, pradaxa can be started 6 hours after sheaths are removed.  We typically would stop ASA once on pradaxa or coumadin  unless absolutely necessary.  2) CHF/AI: Reassess with echo. We can up titrate as below.   3) HTN: above goal. Increase losartan  4) RI: Follow creat.  Creatinine Clearance is 58 today.   Hillis Range, MD 02/15/2012 9:48 AM

## 2012-02-15 NOTE — Progress Notes (Signed)
Subjective: SOB, pt pt reports she is feeling better than yesterday.   Objective: Weight change:   Intake/Output Summary (Last 24 hours) at 02/15/12 1107 Last data filed at 02/15/12 0856  Gross per 24 hour  Intake    120 ml  Output      0 ml  Net    120 ml    Filed Vitals:   02/15/12 0452  BP: 154/49  Pulse: 78  Temp: 97.4 F (36.3 C)  Resp:    On exam  She is alert afebrile comfortable CVS S1S2 irregular rate Lungs clear Abdomen soft NT ND BS+ Extremities: varicosities. Trace edema.   Lab Results: Results for orders placed during the hospital encounter of 02/14/12 (from the past 24 hour(s))  PROTIME-INR     Status: Normal   Collection Time   02/14/12  6:07 PM      Component Value Range   Prothrombin Time 13.8  11.6 - 15.2 (seconds)   INR 1.04  0.00 - 1.49   APTT     Status: Normal   Collection Time   02/14/12  6:07 PM      Component Value Range   aPTT 28  24 - 37 (seconds)  TYPE AND SCREEN     Status: Normal   Collection Time   02/14/12  6:20 PM      Component Value Range   ABO/RH(D) A POS     Antibody Screen NEG     Sample Expiration 02/17/2012    ABO/RH     Status: Normal   Collection Time   02/14/12  6:20 PM      Component Value Range   ABO/RH(D) A POS    CARDIAC PANEL(CRET KIN+CKTOT+MB+TROPI)     Status: Normal   Collection Time   02/14/12  8:57 PM      Component Value Range   Total CK 51  7 - 177 (U/L)   CK, MB 2.1  0.3 - 4.0 (ng/mL)   Troponin I <0.30  <0.30 (ng/mL)   Relative Index RELATIVE INDEX IS INVALID  0.0 - 2.5   URINALYSIS, ROUTINE W REFLEX MICROSCOPIC     Status: Abnormal   Collection Time   02/14/12 10:55 PM      Component Value Range   Color, Urine YELLOW  YELLOW    APPearance HAZY (*) CLEAR    Specific Gravity, Urine 1.024  1.005 - 1.030    pH 5.0  5.0 - 8.0    Glucose, UA NEGATIVE  NEGATIVE (mg/dL)   Hgb urine dipstick NEGATIVE  NEGATIVE    Bilirubin Urine SMALL (*) NEGATIVE    Ketones, ur NEGATIVE  NEGATIVE (mg/dL)   Protein,  ur NEGATIVE  NEGATIVE (mg/dL)   Urobilinogen, UA 1.0  0.0 - 1.0 (mg/dL)   Nitrite NEGATIVE  NEGATIVE    Leukocytes, UA NEGATIVE  NEGATIVE   COMPREHENSIVE METABOLIC PANEL     Status: Abnormal   Collection Time   02/15/12  5:02 AM      Component Value Range   Sodium 141  135 - 145 (mEq/L)   Potassium 3.7  3.5 - 5.1 (mEq/L)   Chloride 104  96 - 112 (mEq/L)   CO2 22  19 - 32 (mEq/L)   Glucose, Bld 86  70 - 99 (mg/dL)   BUN 29 (*) 6 - 23 (mg/dL)   Creatinine, Ser 4.09 (*) 0.50 - 1.10 (mg/dL)   Calcium 9.0  8.4 - 81.1 (mg/dL)   Total Protein 6.5  6.0 -  8.3 (g/dL)   Albumin 3.4 (*) 3.5 - 5.2 (g/dL)   AST 19  0 - 37 (U/L)   ALT 11  0 - 35 (U/L)   Alkaline Phosphatase 101  39 - 117 (U/L)   Total Bilirubin 0.5  0.3 - 1.2 (mg/dL)   GFR calc non Af Amer 46 (*) >90 (mL/min)   GFR calc Af Amer 53 (*) >90 (mL/min)  MAGNESIUM     Status: Normal   Collection Time   02/15/12  5:02 AM      Component Value Range   Magnesium 2.4  1.5 - 2.5 (mg/dL)  PHOSPHORUS     Status: Normal   Collection Time   02/15/12  5:02 AM      Component Value Range   Phosphorus 4.6  2.3 - 4.6 (mg/dL)  CBC     Status: Normal   Collection Time   02/15/12  5:02 AM      Component Value Range   WBC 6.3  4.0 - 10.5 (K/uL)   RBC 4.13  3.87 - 5.11 (MIL/uL)   Hemoglobin 12.5  12.0 - 15.0 (g/dL)   HCT 40.9  81.1 - 91.4 (%)   MCV 91.5  78.0 - 100.0 (fL)   MCH 30.3  26.0 - 34.0 (pg)   MCHC 33.1  30.0 - 36.0 (g/dL)   RDW 78.2  95.6 - 21.3 (%)   Platelets 213  150 - 400 (K/uL)  DIFFERENTIAL     Status: Normal   Collection Time   02/15/12  5:02 AM      Component Value Range   Neutrophils Relative 50  43 - 77 (%)   Neutro Abs 3.1  1.7 - 7.7 (K/uL)   Lymphocytes Relative 35  12 - 46 (%)   Lymphs Abs 2.2  0.7 - 4.0 (K/uL)   Monocytes Relative 12  3 - 12 (%)   Monocytes Absolute 0.8  0.1 - 1.0 (K/uL)   Eosinophils Relative 3  0 - 5 (%)   Eosinophils Absolute 0.2  0.0 - 0.7 (K/uL)   Basophils Relative 1  0 - 1 (%)   Basophils  Absolute 0.0  0.0 - 0.1 (K/uL)  APTT     Status: Normal   Collection Time   02/15/12  5:02 AM      Component Value Range   aPTT 29  24 - 37 (seconds)  PROTIME-INR     Status: Normal   Collection Time   02/15/12  5:02 AM      Component Value Range   Prothrombin Time 14.0  11.6 - 15.2 (seconds)   INR 1.06  0.00 - 1.49   CARDIAC PANEL(CRET KIN+CKTOT+MB+TROPI)     Status: Normal   Collection Time   02/15/12  5:02 AM      Component Value Range   Total CK 48  7 - 177 (U/L)   CK, MB 2.4  0.3 - 4.0 (ng/mL)   Troponin I <0.30  <0.30 (ng/mL)   Relative Index RELATIVE INDEX IS INVALID  0.0 - 2.5   HEPARIN LEVEL (UNFRACTIONATED)     Status: Abnormal   Collection Time   02/15/12  6:30 AM      Component Value Range   Heparin Unfractionated <0.10 (*) 0.30 - 0.70 (IU/mL)  CBC     Status: Normal   Collection Time   02/15/12  6:30 AM      Component Value Range   WBC 6.0  4.0 - 10.5 (K/uL)   RBC 4.21  3.87 - 5.11 (MIL/uL)   Hemoglobin 12.7  12.0 - 15.0 (g/dL)   HCT 21.3  08.6 - 57.8 (%)   MCV 92.4  78.0 - 100.0 (fL)   MCH 30.2  26.0 - 34.0 (pg)   MCHC 32.6  30.0 - 36.0 (g/dL)   RDW 46.9  62.9 - 52.8 (%)   Platelets 207  150 - 400 (K/uL)    Micro Results: No results found for this or any previous visit (from the past 240 hour(s)).  Studies/Results: No results found. Medications: Scheduled Meds:   . allopurinol  200 mg Oral Daily  . aspirin  325 mg Oral Daily  . furosemide  40 mg Oral Daily  . heparin  2,000 Units Intravenous Once  . heparin  3,500 Units Intravenous Once  . losartan  50 mg Oral Daily  . potassium chloride SA  20 mEq Oral Daily  . sodium chloride  3 mL Intravenous Q12H  . DISCONTD: enoxaparin  40 mg Subcutaneous QHS  . DISCONTD: losartan  25 mg Oral Daily   Continuous Infusions:   . heparin 1,000 Units/hr (02/14/12 2241)   PRN Meds:.acetaminophen, acetaminophen, nitroGLYCERIN, ondansetron (ZOFRAN) IV, ondansetron  Assessment/Plan: 1. Lower Limb Ischemia;  probably an embolic event from Atrial Fibrillation on IV heparin. Arteriogram and venous duplex to evaluate for DVT pending.  Appreciate cardiology input.  2.Atrial Fibrillation : rate controlled.  3. Chronic Systolic Heart Failure: on lasix. Repeat Echo ordered. 4. Hypertension: not well controlled. Losartan increased.  5. Stage 2 CKD:  Continue to monitor.   DVT prophylaxis: on heparin  FULL CODE.   LOS: 1 day   Amauri Keefe 02/15/2012, 11:07 AM

## 2012-02-15 NOTE — H&P (View-Only) (Signed)
VASCULAR PROGRESS NOTE   SUBJECTIVE: No significant pain left foot  PHYSICAL EXAM: Filed Vitals:   02/14/12 1908 02/14/12 2256 02/15/12 0303 02/15/12 0452  BP: 164/61 171/55 150/33 154/49  Pulse: 88 79 70 78  Temp: 97.4 F (36.3 C) 97.4 F (36.3 C) 97.7 F (36.5 C) 97.4 F (36.3 C)  TempSrc: Oral Oral Oral Oral  Resp: 20 20 18   Height: 5' 2" (1.575 m)     Weight: 171 lb (77.565 kg)   181 lb 7 oz (82.3 kg)  SpO2: 97% 96% 97% 96%   Palpable left femoral and popliteal pulse.  Monophasic  doppler flow left foot (DP, ATA, PT, & Per)  LABS: Lab Results  Component Value Date   WBC 6.0 02/15/2012   HGB 12.7 02/15/2012   HCT 38.9 02/15/2012   MCV 92.4 02/15/2012   PLT 207 02/15/2012   Lab Results  Component Value Date   CREATININE 1.17* 02/15/2012   Lab Results  Component Value Date   INR 1.06 02/15/2012   CBG (last 3)  No results found for this basename: GLUCAP:3 in the last 72 hours   ASSESSMENT/PLAN: 1. Likely had an embolic event to the left leg, but symptoms resolved, suggesting small clot burden. Admitted for IV heparin. H/O A. Fib, not on coumadin bc/ of hemorraghic stroke in 2000. 2-D Echo pending. Venous Duplex pending.  Will proceed with angiogram tomorrow to further evaluate left LE. Will hold heparin in AM prior to Angio. I have reviewed with the patient the indications for arteriography. In addition, I have reviewed the potential complications of arteriography including but not limited to: Bleeding, arterial injury, arterial thrombosis, dye action, renal insufficiency, or other unpredictable medical problems.   Heather Kowal, MD, FACS Beeper: [phone] 02/15/2012  ADDENDUM: Duplex reviewed: this shows thrombus in mid to distal popliteal artery. I have recommended popliteal embolectomy today. Given her multiple cardiac issues, she is at increased risk with surgery, but this is potentially a limb threatening problem. Discussed case with Dr. Allred, he agrees that it  is reasonable to proceed with the understanding that she is at increased risk for periop cardiac complications. Will obviously try to keep things as simple as possible.  Heather Sellin S. Amen Dargis, MD, FACS Beeper [phone] 02/15/2012    

## 2012-02-15 NOTE — Progress Notes (Signed)
VASCULAR LAB PRELIMINARY  PRELIMINARY  PRELIMINARY  PRELIMINARY  Left lower extremity venous Doppler completed.    Preliminary report:  There is acute, partially occlusive DVT noted in the mid PTV of the left lower extremity.  Incidentally, there is thrombus noted in the mid to distal popliteal artery.  Dampened monophasic waveforms noted in the PTA.  Sherren Kerns [place], 02/15/2012, 11:36 AM

## 2012-02-15 NOTE — Progress Notes (Signed)
ANTICOAGULATION CONSULT NOTE - Follow Up Consult  Pharmacy Consult for Heparin Indication: leg ischemia, suspected DVT, hx Afib  Allergies  Allergen Reactions  . Latex Rash  . Neomycin-Bacitracin Zn-Polymyx Rash    Patient Measurements: Height: 5\' 2"  (157.5 cm) Weight: 181 lb 7 oz (82.3 kg) (Bed wt.) IBW/kg (Calculated) : 50.1  Heparin Dosing Weight: 67.1kg  Vital Signs: Temp: 97.4 F (36.3 C) (05/19 0452) Temp src: Oral (05/19 0452) BP: 154/49 mmHg (05/19 0452) Pulse Rate: 78  (05/19 0452)  Labs:  Basename 02/15/12 0630 02/15/12 0502 02/14/12 2057 02/14/12 1807 02/13/12 0919  HGB 12.7 12.5 -- -- --  HCT 38.9 37.8 -- -- 40.6  PLT 207 213 -- -- 256  APTT -- 29 -- 28 --  LABPROT -- 14.0 -- 13.8 --  INR -- 1.06 -- 1.04 --  HEPARINUNFRC <0.10* -- -- -- --  CREATININE -- 1.17* -- -- 1.39*  CKTOTAL -- 48 51 -- --  CKMB -- 2.4 2.1 -- --  TROPONINI -- <0.30 <0.30 -- --    Estimated Creatinine Clearance: 44.5 ml/min (by C-G formula based on Cr of 1.17).   Medical History: Past Medical History  Diagnosis Date  . History of ventricular tachycardia   . DOE (dyspnea on exertion)   . Peripheral edema   . Aortic insufficiency     Moderate 2012  . 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 History of gout   . Exogenous obesity   . History of CHF (congestive heart failure)     EF 45% 2012, nonischemic (Normal coronaries 2002)  . Renal insufficiency   . 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 Hypertension   . Stroke     Medications:  Prescriptions prior to admission  Medication Sig Dispense Refill  . allopurinol (ZYLOPRIM) 100 MG tablet Take 100 mg by mouth as directed. 2 tablets daily       . aspirin 325 MG tablet Take 325 mg by mouth daily.        [name] BIOTIN PO Take 1 capsule by mouth daily.      . furosemide (LASIX) 40 MG tablet Take one tablet by mouth daily, except every third day take two tablets once  daily  100 tablet  3  . losartan (COZAAR) 25 MG tablet Take 1 tablet (25 mg total) by mouth daily.  90 tablet  3  . potassium chloride SA (K-DUR,KLOR-CON) 20 MEQ tablet Take 1 tablet (20 mEq total) by mouth daily.  90 tablet  3  . nitroGLYCERIN (NITROSTAT) 0.4 MG SL tablet Place 1 tablet (0.4 mg total) under the tongue every 5 (five) minutes as needed.  25 tablet  prn    Assessment: 71yof admitted for leg pain on heparin for leg ischemia/suspected DVT. 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 Per Dr. Antoine Poche, [facility] for full-dose heparin (with boluses). Heparin level (< 0.1) is below-goal on 1000 units/hr. No problem with infusion per RN.   Goal of Therapy:  Heparin level 0.3-0.7 units/ml Monitor platelets by anticoagulation protocol: Yes   Plan:  1. Heparin 2000 unit IV bolus x 1, then increase to 1250 units/hr.  2. Heparin level in 8 hours.   Emeline Gins 02/15/2012,7:14 AM

## 2012-02-15 NOTE — Progress Notes (Signed)
RT NOTE Pt extubated by MD in PACU

## 2012-02-15 NOTE — Anesthesia Postprocedure Evaluation (Signed)
Anesthesia Post Note  Patient: [name]  Procedure(s) Performed: Procedure(s) (LRB): EMBOLECTOMY (Left)  Anesthesia type: general  Patient location: PACU  Post pain: Pain level controlled  Post assessment: Patient's Cardiovascular Status Stable  Last Vitals:  Filed Vitals:   02/15/12 1800  BP:   Pulse: 75  Temp:   Resp: 20    Post vital signs: Reviewed and stable  Level of consciousness: sedated  Complications: No apparent anesthesia complications

## 2012-02-15 NOTE — Preoperative (Signed)
Beta Blockers   Reason not to administer Beta Blockers:Not Applicable 

## 2012-02-16 ENCOUNTER — Encounter (HOSPITAL_COMMUNITY): Payer: Self-pay | Admitting: Vascular Surgery

## 2012-02-16 ENCOUNTER — Inpatient Hospital Stay (HOSPITAL_COMMUNITY): Payer: Medicare Other

## 2012-02-16 DIAGNOSIS — I10 Essential (primary) hypertension: Secondary | ICD-10-CM

## 2012-02-16 DIAGNOSIS — D649 Anemia, unspecified: Secondary | ICD-10-CM

## 2012-02-16 DIAGNOSIS — I5022 Chronic systolic (congestive) heart failure: Secondary | ICD-10-CM

## 2012-02-16 DIAGNOSIS — I739 Peripheral vascular disease, unspecified: Secondary | ICD-10-CM

## 2012-02-16 LAB — CBC
HCT: 30.3 % — ABNORMAL LOW (ref 36.0–46.0)
Hemoglobin: 9.9 g/dL — ABNORMAL LOW (ref 12.0–15.0)
MCH: 30.4 pg (ref 26.0–34.0)
MCHC: 32.7 g/dL (ref 30.0–36.0)

## 2012-02-16 LAB — BASIC METABOLIC PANEL
BUN: 20 mg/dL (ref 6–23)
CO2: 24 mEq/L (ref 19–32)
Calcium: 8.3 mg/dL — ABNORMAL LOW (ref 8.4–10.5)
Chloride: 103 mEq/L (ref 96–112)
Creatinine, Ser: 1.04 mg/dL (ref 0.50–1.10)
GFR calc Af Amer: 62 mL/min — ABNORMAL LOW (ref >90)
GFR calc non Af Amer: 53 mL/min — ABNORMAL LOW (ref >90)
Glucose, Bld: 106 mg/dL — ABNORMAL HIGH (ref 70–99)
Potassium: 3.5 mEq/L (ref 3.5–5.1)
Sodium: 137 mEq/L (ref 135–145)

## 2012-02-16 MED ORDER — RIVAROXABAN 10 MG PO TABS: 20.0000 mg | ORAL_TABLET | Freq: Every day | ORAL | Status: DC

## 2012-02-16 MED ORDER — RIVAROXABAN 15 MG PO TABS
15.0000 mg | ORAL_TABLET | Freq: Two times a day (BID) | ORAL | Status: DC
Start: 2012-02-16 — End: 2012-02-16
  Filled 2012-02-16 (×2): qty 1

## 2012-02-16 MED ORDER — MAGIC MOUTHWASH
5.0000 mL | Freq: Four times a day (QID) | ORAL | Status: DC
  Administered 2012-02-16 – 2012-02-20 (×15): 5 mL via ORAL
  Filled 2012-02-16 (×18): qty 5

## 2012-02-16 MED ORDER — CARVEDILOL 3.125 MG PO TABS
3.1250 mg | ORAL_TABLET | Freq: Two times a day (BID) | ORAL | Status: DC
  Administered 2012-02-16 – 2012-02-20 (×9): 3.125 mg via ORAL
  Filled 2012-02-16 (×11): qty 1

## 2012-02-16 MED ORDER — RIVAROXABAN 15 MG PO TABS
15.0000 mg | ORAL_TABLET | Freq: Two times a day (BID) | ORAL | Status: DC
  Administered 2012-02-16 – 2012-02-20 (×8): 15 mg via ORAL
  Filled 2012-02-16 (×11): qty 1

## 2012-02-16 MED ORDER — SENNOSIDES-DOCUSATE SODIUM 8.6-50 MG PO TABS
2.0000 | ORAL_TABLET | Freq: Two times a day (BID) | ORAL | Status: DC
  Administered 2012-02-16 – 2012-02-20 (×5): 2 via ORAL
  Filled 2012-02-16 (×3): qty 2
  Filled 2012-02-16: qty 1
  Filled 2012-02-16 (×4): qty 2
  Filled 2012-02-16: qty 1
  Filled 2012-02-16: qty 2

## 2012-02-16 MED ORDER — FUROSEMIDE 10 MG/ML IJ SOLN
40.0000 mg | Freq: Once | INTRAMUSCULAR | Status: AC
  Administered 2012-02-16: 40 mg via INTRAVENOUS
  Filled 2012-02-16: qty 4

## 2012-02-16 MED ORDER — BENZOCAINE 10 % MT GEL
Freq: Three times a day (TID) | OROMUCOSAL | Status: DC | PRN
  Administered 2012-02-16: 23:00:00 via OROMUCOSAL
  Filled 2012-02-16: qty 9.4

## 2012-02-16 MED ORDER — POLYETHYLENE GLYCOL 3350 17 G PO PACK
17.0000 g | PACK | Freq: Every day | ORAL | Status: DC
  Administered 2012-02-16 – 2012-02-17 (×2): 17 g via ORAL
  Filled 2012-02-16 (×5): qty 1

## 2012-02-16 MED ORDER — TRAMADOL HCL 50 MG PO TABS
50.0000 mg | ORAL_TABLET | Freq: Two times a day (BID) | ORAL | Status: DC | PRN
  Administered 2012-02-16: 50 mg via ORAL
  Filled 2012-02-16: qty 1

## 2012-02-16 NOTE — Care Management Note (Unsigned)
    Page 1 of 1   02/18/2012     11:39:46 AM   CARE MANAGEMENT NOTE 02/18/2012  Patient:  [name], [name]   Account Number:  [id number]  Date Initiated:  02/16/2012  Documentation initiated by:  Junius Creamer  Subjective/Objective Assessment:   adm w  pain in leg, embolectomy     Action/Plan:   lives w fam, pcp dr Cyd Silence hodgin   Anticipated DC Date:  02/18/2012   Anticipated DC Plan:  HOME/SELF CARE      DC Planning Services  CM consult      Choice offered to / List presented to:             Status of service:   Medicare Important Message given?   (If response is "NO", the following Medicare IM given date fields will be blank) Date Medicare IM given:   Date Additional Medicare IM given:    Discharge Disposition:    Per UR Regulation:  Reviewed for med. necessity/level of care/duration of stay  If discussed at Long Length of Stay Meetings, dates discussed:    Comments:  02/18/12  1134   Madysin Crisp SIMMONS RN, BSN [phone] TX FROM CICU YESTERDAY; NCM ASKED RN TO ENCOURAGE PT TO GET OOB AND AMBULATE.  D/C DISPOSITION PENDING MOBILITY AND SUPPORT AT HOME;   NCM WILL FOLLOW.   5/20 9:46a debbie dowell rn,bsn [phone]

## 2012-02-16 NOTE — Progress Notes (Signed)
Subjective: Abdominal pain.   Objective: Weight change: 4.536 kg (10 lb)  Intake/Output Summary (Last 24 hours) at 02/16/12 1808 Last data filed at 02/16/12 1800  Gross per 24 hour  Intake 1901.5 ml  Output   1635 ml  Net  266.5 ml    Filed Vitals:   02/16/12 1800  BP:   Pulse: 87  Temp:   Resp: 21   HEENT: PERRL, EOMI, no scleral icterus  Cardiac: irregularly irregular rhythm, 3/6 early diastolic murmur, no rubs, or gallops  Pulm: clear to auscultation bilaterally, no wheezes, rales, or rhonchi  Abd: soft, nontender, nondistended, BS present  Ext: warm and well perfused, no pedal edema, bandage in place on left leg. CDI.  Neuro: alert and oriented X3, cranial nerves II-XII grossly intact, strength and sensation to light touch equal in bilateral upper and lower extremities   Lab Results: Results for orders placed during the hospital encounter of 02/14/12 (from the past 24 hour(s))  CBC     Status: Abnormal   Collection Time   02/16/12  4:30 AM      Component Value Range   WBC 11.5 (*) 4.0 - 10.5 (K/uL)   RBC 3.26 (*) 3.87 - 5.11 (MIL/uL)   Hemoglobin 9.9 (*) 12.0 - 15.0 (g/dL)   HCT 16.1 (*) 09.6 - 46.0 (%)   MCV 92.9  78.0 - 100.0 (fL)   MCH 30.4  26.0 - 34.0 (pg)   MCHC 32.7  30.0 - 36.0 (g/dL)   RDW 04.5  40.9 - 81.1 (%)   Platelets 165  150 - 400 (K/uL)  HEPARIN LEVEL (UNFRACTIONATED)     Status: Normal   Collection Time   02/16/12  4:30 AM      Component Value Range   Heparin Unfractionated 0.33  0.30 - 0.70 (IU/mL)  BASIC METABOLIC PANEL     Status: Abnormal   Collection Time   02/16/12  4:30 AM      Component Value Range   Sodium 137  135 - 145 (mEq/L)   Potassium 3.5  3.5 - 5.1 (mEq/L)   Chloride 103  96 - 112 (mEq/L)   CO2 24  19 - 32 (mEq/L)   Glucose, Bld 106 (*) 70 - 99 (mg/dL)   BUN 20  6 - 23 (mg/dL)   Creatinine, Ser 9.14  0.50 - 1.10 (mg/dL)   Calcium 8.3 (*) 8.4 - 10.5 (mg/dL)   GFR calc non Af Amer 53 (*) >90 (mL/min)   GFR calc Af Amer 62  (*) >90 (mL/min)     Micro Results: Recent Results (from the past 240 hour(s))  MRSA PCR SCREENING     Status: Normal   Collection Time   02/15/12  7:00 PM      Component Value Range Status Comment   MRSA by PCR NEGATIVE  NEGATIVE  Final     Studies/Results: Dg Chest 2 View  02/15/2012  *RADIOLOGY REPORT*  Clinical Data: Short of breath and dizziness.  CHEST - 2 VIEW  Comparison: 07/11/2011  Findings: Cardiac enlargement with AICD which is unchanged. Mild vascular congestion.  Prominent interstitial markings may represent mild interstitial edema.  Small left pleural effusion.  Negative for pneumonia.  IMPRESSION: Cardiac enlargement with mild interstitial edema due to fluid overload.  Original Report Authenticated By: Camelia Phenes, M.D.   Medications: Scheduled Meds:   . allopurinol  200 mg Oral Daily  . aspirin  325 mg Oral Daily  . carvedilol  3.125 mg Oral BID WC  .  furosemide  40 mg Intravenous Once  . furosemide  40 mg Oral Daily  . losartan  50 mg Oral Daily  . polyethylene glycol  17 g Oral Daily  . potassium chloride SA  20 mEq Oral Daily  . rivaroxaban  15 mg Oral BID WC   Followed by  . rivaroxaban  20 mg Oral Q supper  . senna-docusate  2 tablet Oral BID  . sodium chloride  3 mL Intravenous Q12H  . DISCONTD: rivaroxaban  20 mg Oral Daily  . DISCONTD: rivaroxaban  15 mg Oral BID WC   Continuous Infusions:   . sodium chloride 10 mL/hr at 02/16/12 1000  . DISCONTD: heparin 1,250 Units/hr (02/15/12 2208)   PRN Meds:.acetaminophen, acetaminophen, ALPRAZolam, morphine injection, morphine injection, nitroGLYCERIN, ondansetron (ZOFRAN) IV, ondansetron, DISCONTD: droperidol, DISCONTD:  HYDROmorphone (DILAUDID) injection, DISCONTD: traMADol  Assessment/Plan: Patient Active Hospital Problem List: 1. Lower Limb Ischemia: s/p embolectomy yesterday. She is currently on IV Heparin and [place] Ramp will be started tonight.  Venous doppler came positive for DVT and IV heparin and  Xeralto for anticoagulation.    2.Atrial Fibrillation : rate controlled. On IV heparin. 3. Chronic Systolic Heart Failure: Repeat ECHO  Showed worsening of the left ventricular EF. She has ICD. Continue with the lasix and ARB and coreg. Appreciate Cardiology input.    4. Hypertension: not well controlled. Losartan increased and coreg added. 5. Stage 2 CKD: Continue to monitor.  6. Abdominal pain: probably secondary to constipation. Stool softners and miralax ordered and an acute abd series.   DVT prophylaxis.    LOS: 2 days   [name] 02/16/2012, 6:08 PM

## 2012-02-16 NOTE — Progress Notes (Signed)
VASCULAR PROGRESS NOTE  SUBJECTIVE: Comfortable  PHYSICAL EXAM: Filed Vitals:   02/16/12 0230 02/16/12 0300 02/16/12 0400 02/16/12 0500  BP:      Pulse: 88 102 101 100  Temp:   97.8 F (36.6 C)   TempSrc:   Oral   Resp: 18 28 19 19   Height:      Weight:      SpO2: 99% 98% 99% 99%   Lungs: clear to auscultation Palpable left posterior tibial pulse. Foot warm and well-perfused JP drain and 55 cc.  LABS: Lab Results  Component Value Date   WBC 11.5* 02/16/2012   HGB 9.9* 02/16/2012   HCT 30.3* 02/16/2012   MCV 92.9 02/16/2012   PLT 165 02/16/2012   Lab Results  Component Value Date   CREATININE 1.04 02/16/2012   Lab Results  Component Value Date   INR 1.06 02/15/2012   CBG (last 3)   Basename 02/15/12 1930  GLUCAP 91   ASSESSMENT/PLAN: 1. 1 Day Post-Op s/p: Left popliteal and tibial embolectomy with bovine pericardial patch angioplasty of popliteal and posterior tibial artery. 2. As per Dr. Jenel Lucks note, patient is being considered for Pradaxa to prevent further embolization. From my standpoint, this could be started today if the primary service agrees. 3. Discontinued JP drain when drainage less than 30 cc for 8 hours. 4. Okay to ambulate from my standpoint.  Waverly Ferrari, MD, FACS Beeper: [phone] 02/16/2012

## 2012-02-16 NOTE — Progress Notes (Signed)
[facility] Resident Note  Subjective:  Underwent embolectomy yesterday and states she is feeling better today.  Leg pain is 4/10 but better then it was when she came in.  No numbness or tingling in the foot and leg.  States her breathing is bad when she lays flat but if she sits up she feels fine.    Objective:  Vital Signs in the last 24 hours: Filed Vitals:   02/16/12 0230 02/16/12 0300 02/16/12 0400 02/16/12 0500  BP:      Pulse: 88 102 101 100  Temp:   97.8 F (36.6 C)   TempSrc:   Oral   Resp: 18 28 19 19   Height:      Weight:      SpO2: 99% 98% 99% 99%   Intake/Output from previous day: 05/19 0701 - 05/20 0700 In: 3395 [P.O.:450; I.V.:2195; IV Piggyback:750] Out: 935 [Urine:380; Drains:55; Blood:500]  24-hour weight change: Weight change: 10 lb (4.536 kg)  Weight trends: Filed Weights   02/14/12 1908 02/15/12 0452 02/15/12 1930  Weight: 171 lb (77.565 kg) 181 lb 7 oz (82.3 kg) 181 lb (82.101 kg)   Physical Exam: Vitals reviewed. General: resting in bed, NAD HEENT: PERRL, EOMI, no scleral icterus Cardiac: irregularly irregular rhythm, 3/6 early diastolic murmur, no rubs, or gallops Pulm: clear to auscultation bilaterally, no wheezes, rales, or rhonchi Abd: soft, nontender, nondistended, BS present Ext: warm and well perfused, no pedal edema, bandage in place on left leg.  CDI. Neuro: alert and oriented X3, cranial nerves II-XII grossly intact, strength and sensation to light touch equal in bilateral upper and lower extremities  Lab Results:  Basename 02/16/12 0430 02/15/12 2000  WBC 11.5* 10.3  HGB 9.9* 10.8*  PLT 165 188    Basename 02/16/12 0430 02/15/12 0502  NA 137 141  K 3.5 3.7  CL 103 104  CO2 24 22  GLUCOSE 106* 86  BUN 20 29*  CREATININE 1.04 1.17*    Basename 02/15/12 1230 02/15/12 0502  TROPONINI <0.30 <0.30    Basename 02/15/12 1930  GLUCAP 91    2D echo: - Left ventricle: The cavity size was moderately  dilated. Wall thickness was normal. Systolic function was severely reduced. The estimated ejection fraction was 20%. Severe diffuse hypokinesis with absent systolic thickening of the inferior myocardium. - Aortic valve: Moderate to severe regurgitation. - Ascending aorta: The ascending aorta was mildly to moderately dilated. Linear echo in the ascending aorta likely artifact, but dissection cannot be unequivocally excluded. - Mitral valve: Mild regurgitation. - Left atrium: The atrium was moderately to severely dilated. - Right atrium: The atrium was moderately dilated. - Atrial septum: No defect or patent foramen ovale was identified. - Pulmonary arteries: PA peak pressure: 52mm Hg (S).  Impressions: - Compared to the previous study performed 12/23/10, LV and aortic root size have increased; further impairment of LV systolic function, worsening AI.  Tele: Reviewed Atrial fibrillation  Scheduled Meds:   . allopurinol  200 mg Oral Daily  . aspirin  325 mg Oral Daily  .  ceFAZolin (ANCEF) IV  1 g Intravenous On Call  . furosemide  40 mg Oral Daily  . heparin  2,000 Units Intravenous Once  . losartan  50 mg Oral Daily  . potassium chloride SA  20 mEq Oral Daily  . sodium chloride  3 mL Intravenous Q12H  . DISCONTD:  ceFAZolin (ANCEF) IV  1 g Intravenous On Call  . DISCONTD: losartan  25  mg Oral Daily   Continuous Infusions:   . sodium chloride 75 mL/hr at 02/15/12 1952  . heparin 1,250 Units/hr (02/15/12 2208)  . DISCONTD: heparin 1,250 Units/hr (02/15/12 0718)   PRN Meds:.acetaminophen, acetaminophen, ALPRAZolam, morphine injection, morphine injection, nitroGLYCERIN, ondansetron (ZOFRAN) IV, ondansetron, DISCONTD: 0.9 % irrigation (POUR BTL), DISCONTD: droperidol, DISCONTD: heparin 6000 unit irrigation, DISCONTD:  HYDROmorphone (DILAUDID) injection, DISCONTD: Surgifoam 100 with Thrombin 20,000 units (20 ml) topical solution  Imaging: Dg Chest 2 View  02/15/2012   *RADIOLOGY REPORT*  Clinical Data: Short of breath and dizziness.  CHEST - 2 VIEW  Comparison: 07/11/2011  Findings: Cardiac enlargement with AICD which is unchanged. Mild vascular congestion.  Prominent interstitial markings may represent mild interstitial edema.  Small left pleural effusion.  Negative for pneumonia.  IMPRESSION: Cardiac enlargement with mild interstitial edema due to fluid overload.  Original Report Authenticated By: Camelia Phenes, M.D.   Assessment/Plan:  1. Leg pain (likely embolic with afib): Dr Edilia Bo performed an embolectomy yesterday and retrieved a large clot from the popliteal artery this morning. She is still on heparin gtt until then.  Per Dr. Jenel Lucks recommendation the benefits of long term anticoagulation likely outweigh the risks of bleeding currently.  TEE has not been suggested secondary to the fact that it would not change the management in this case.  There is Significant risk reduction in stroke/ emboli in afib with pradaxa compared to warfarin as well as significant reduction in intercranial bleed with pradaxa, So the suggestion would be to start that about 5 hours after the procedure without heparin overlap.  150 mg BID.    2) CHF/AI: EF 20% per echo with severe diffuse hypokinesis throughout.  LA moderately to severely dilated, moderate to severe regurg. She likely will need diuresis likely secondary to fluid with surgery and her current orthopnea.    3) HTN: above goal. Increase losartan and watch especially with diuresis.  4) RI: Creatinine down some but her GFR is essentially unchanged.  Will continue to monitor.   Length of Stay: 2 days  Patient history and plan of care reviewed with attending, Dr. Daphane Shepherd, M.D. 02/16/2012, 6:46 AM As above, patient seen and examined; patient doing well s/p embolectomy; venous dopplers also reveal DVT on the left. Pradaxa to date has no indication for DVT. Therefore, continue heparin for now. When  OK with surgery, DC heparin and begin xeralto which has an indication for atrial fibrillation and DVT. DC asa once xeralto initiated. May need xeralto 15 mg daily if GFR is less than 50. Patient also noted to have reduced LV function on echo (worse compared to previous), moderate to severe AI and dilated aortic root. She is mildly volume overloaded on exam; will give lasix 40 mg IV x1 and follow renal function. Continue ARB. Add coreg 3.125 mg po BID (watch HR). Once she recovers from embolic event, she will need CTA or MRA to evaluate aortic root and consideration of AVR (can be done as outpatient with Dr Patty Sermons). Olga Millers

## 2012-02-16 NOTE — Progress Notes (Signed)
ANTICOAGULATION CONSULT NOTE - Follow Up Consult  Pharmacy Consult for Heparin Indication: DVT s/p L poptileal and tibial embolectomy  Allergies  Allergen Reactions  . Latex Rash  . Neomycin-Bacitracin Zn-Polymyx Rash    Patient Measurements: Height: 5\' 2"  (157.5 cm) Weight: 181 lb (82.101 kg) IBW/kg (Calculated) : 50.1  Heparin Dosing Weight: 67.1kg  Vital Signs: Temp: 98.2 F (36.8 C) (05/20 0734) Temp src: Oral (05/20 0734) BP: 147/56 mmHg (05/20 0734) Pulse Rate: 91  (05/20 0800)  Labs:  Basename 02/16/12 0430 02/15/12 2000 02/15/12 1230 02/15/12 0630 02/15/12 0502 02/14/12 2057 02/14/12 1807 02/13/12 0919  HGB 9.9* 10.8* -- -- -- -- -- --  HCT 30.3* 33.1* -- 38.9 -- -- -- --  PLT 165 188 -- 207 -- -- -- --  APTT -- -- -- -- 29 -- 28 --  LABPROT -- -- -- -- 14.0 -- 13.8 --  INR -- -- -- -- 1.06 -- 1.04 --  HEPARINUNFRC 0.33 -- -- <0.10* -- -- -- --  CREATININE 1.04 -- -- -- 1.17* -- -- 1.39*  CKTOTAL -- -- 49 -- 48 51 -- --  CKMB -- -- 2.5 -- 2.4 2.1 -- --  TROPONINI -- -- <0.30 -- <0.30 <0.30 -- --    Estimated Creatinine Clearance: 50 ml/min (by C-G formula based on Cr of 1.04).  Assessment: 12 yof with DVT s/p left popliteal and tibial embolectomy 5/19. Pt to resume heparin post-op at 2200 5/19. 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 Per Dr. Antoine Poche, [facility] for full-dose heparin (with boluses).  H/H low but stable, no bleeding  Noted.  VSS.  Heparin drip 1250 uts/hr HL 0.33 at goal.  Conversion to po anticoagulation being dicussed.    Goal of Therapy:  Heparin level 0.3-0.7 units/ml Monitor platelets by anticoagulation protocol: Yes   Plan:  1. Continue heparin drip 1250 units/hr  2. F/u a.m. Heparin level and CBC    Leota Sauers Pharm.D. CPP, BCPS Clinical Pharmacist [phone] 02/16/2012 9:26 AM

## 2012-02-17 ENCOUNTER — Inpatient Hospital Stay (HOSPITAL_COMMUNITY): Payer: Medicare Other

## 2012-02-17 DIAGNOSIS — D649 Anemia, unspecified: Secondary | ICD-10-CM

## 2012-02-17 DIAGNOSIS — I10 Essential (primary) hypertension: Secondary | ICD-10-CM

## 2012-02-17 DIAGNOSIS — K5900 Constipation, unspecified: Secondary | ICD-10-CM | POA: Diagnosis not present

## 2012-02-17 DIAGNOSIS — I739 Peripheral vascular disease, unspecified: Secondary | ICD-10-CM

## 2012-02-17 DIAGNOSIS — I5022 Chronic systolic (congestive) heart failure: Secondary | ICD-10-CM

## 2012-02-17 LAB — BASIC METABOLIC PANEL
BUN: 19 mg/dL (ref 6–23)
CO2: 26 mEq/L (ref 19–32)
Calcium: 8.4 mg/dL (ref 8.4–10.5)
Chloride: 102 mEq/L (ref 96–112)
Creatinine, Ser: 1.11 mg/dL — ABNORMAL HIGH (ref 0.50–1.10)
Glucose, Bld: 95 mg/dL (ref 70–99)

## 2012-02-17 LAB — CBC
HCT: 29.3 % — ABNORMAL LOW (ref 36.0–46.0)
MCH: 30.7 pg (ref 26.0–34.0)
MCV: 93.6 fL (ref 78.0–100.0)
Platelets: 163 10*3/uL (ref 150–400)
RBC: 3.13 MIL/uL — ABNORMAL LOW (ref 3.87–5.11)

## 2012-02-17 MED ORDER — FUROSEMIDE 10 MG/ML IJ SOLN
40.0000 mg | Freq: Once | INTRAMUSCULAR | Status: AC
  Administered 2012-02-17: 40 mg via INTRAVENOUS
  Filled 2012-02-17: qty 4

## 2012-02-17 MED ORDER — FLEET ENEMA 7-19 GM/118ML RE ENEM
1.0000 | ENEMA | Freq: Once | RECTAL | Status: AC
  Administered 2012-02-17: 1 via RECTAL
  Filled 2012-02-17: qty 1

## 2012-02-17 NOTE — Progress Notes (Signed)
VASCULAR PROGRESS NOTE  SUBJECTIVE: No specific complaints  PHYSICAL EXAM: Filed Vitals:   02/17/12 0000 02/17/12 0523 02/17/12 0800 02/17/12 0827  BP:  124/46 126/48 126/48  Pulse: 71 72 77 74  Temp: 98.2 F (36.8 C) 97.6 F (36.4 C) 97.4 F (36.3 C)   TempSrc: Oral Oral Oral   Resp: 19 18 20    Height:      Weight:      SpO2: 100% 100% 100%    Lungs: clear to auscultation Palpable left posterior tibial pulse. JP 45 cc last shift.  LABS: Lab Results  Component Value Date   WBC 8.9 02/17/2012   HGB 9.6* 02/17/2012   HCT 29.3* 02/17/2012   MCV 93.6 02/17/2012   PLT 163 02/17/2012   Lab Results  Component Value Date   CREATININE 1.11* 02/17/2012   Lab Results  Component Value Date   INR 1.06 02/15/2012   CBG (last 3)   Basename 02/15/12 1930  GLUCAP 91     ASSESSMENT/PLAN: 1. 2 Days Post-Op s/p: left popliteal and tibial embolectomy. 2. Discontinue JP when drainage less than 30 cc for 8 hours. 3. Ambulate 4. On IV heparin and Xarelto.    Waverly Ferrari, MD, FACS Beeper: [phone] 02/17/2012

## 2012-02-17 NOTE — Progress Notes (Signed)
[name] Resident Note  Subjective:  Pain better today but slept awful so is very tired today.  Did not get up and walk yesterday.  No numbness or tingling in the foot or leg. Breathing is better today with mild diuresis yesterday.  Negative 1L yesterday, still +1.5L since admission.  Objective:  Vital Signs in the last 24 hours: Filed Vitals:   02/16/12 1950 02/16/12 2336 02/17/12 0000 02/17/12 0523  BP: 116/41 108/37  124/46  Pulse: 74 73 71 72  Temp: 98.1 F (36.7 C)  98.2 F (36.8 C) 97.6 F (36.4 C)  TempSrc: Oral  Oral Oral  Resp: 20 20 19 18   Height:      Weight:      SpO2: 100% 100% 100% 100%   Intake/Output from previous day: 05/20 0701 - 05/21 0700 In: 951.5 [P.O.:700; I.V.:247.5; IV Piggyback:4] Out: 1890 [Urine:1835; Drains:55]  24-hour weight change: Weight change:   Weight trends: Filed Weights   02/14/12 1908 02/15/12 0452 02/15/12 1930  Weight: 171 lb (77.565 kg) 181 lb 7 oz (82.3 kg) 181 lb (82.101 kg)   Physical Exam: Vitals reviewed. General: resting in bed, NAD HEENT: PERRL, EOMI, no scleral icterus Cardiac: irregularly irregular rhythm, 3/6 diastolic rumble, 3/6 systolic murmur, no rubs, or gallops Pulm: clear to auscultation bilaterally, no wheezes, rales, or rhonchi Abd: soft, nontender, nondistended, BS present Ext: warm and well perfused, 1+ pedal edema Neuro: alert and oriented X3, cranial nerves II-XII grossly intact, strength and sensation to light touch equal in bilateral upper and lower extremities  Lab Results:  Basename 02/17/12 0557 02/16/12 0430  WBC 8.9 11.5*  HGB 9.6* 9.9*  PLT 163 165    Basename 02/17/12 0557 02/16/12 0430  NA 138 137  K 3.5 3.5  CL 102 103  CO2 26 24  GLUCOSE 95 106*  BUN 19 20  CREATININE 1.11* 1.04    Basename 02/15/12 1230 02/15/12 0502  TROPONINI <0.30 <0.30    Basename 02/15/12 1930  GLUCAP 91   2D echo:  - Left ventricle: The cavity size was moderately  dilated. [name] Puls thickness was normal. Systolic function was severely reduced. The estimated ejection fraction was 20%. Severe diffuse hypokinesis with absent systolic thickening of the inferior myocardium. - Aortic valve: Moderate to severe regurgitation. - Ascending aorta: The ascending aorta was mildly to moderately dilated. Linear echo in the ascending aorta likely artifact, but dissection cannot be unequivocally excluded. - Mitral valve: Mild regurgitation. - Left atrium: The atrium was moderately to severely dilated. - Right atrium: The atrium was moderately dilated. - Atrial septum: No defect or patent foramen ovale was identified. - Pulmonary arteries: PA peak pressure: 52mm Hg (S).  Impressions: - Compared to the previous study performed 12/23/10, LV and aortic root size have increased; further impairment of LV systolic function, worsening AI.  Tele: Reviewed, atrial fibrillation with controlled rate  Scheduled Meds:   . allopurinol  200 mg Oral Daily  . aspirin  325 mg Oral Daily  . carvedilol  3.125 mg Oral BID WC  . furosemide  40 mg Intravenous Once  . furosemide  40 mg Oral Daily  . losartan  50 mg Oral Daily  . magic mouthwash  5 mL Oral QID  . polyethylene glycol  17 g Oral Daily  . potassium chloride SA  20 mEq Oral Daily  . rivaroxaban  15 mg Oral BID WC   Followed by  . rivaroxaban  20 mg Oral Q supper  .  senna-docusate  2 tablet Oral BID  . sodium chloride  3 mL Intravenous Q12H  . DISCONTD: rivaroxaban  20 mg Oral Daily  . DISCONTD: rivaroxaban  15 mg Oral BID WC   Continuous Infusions:   . sodium chloride 10 mL/hr at 02/16/12 1000  . DISCONTD: heparin 1,250 Units/hr (02/15/12 2208)   PRN Meds:.acetaminophen, acetaminophen, ALPRAZolam, benzocaine, morphine injection, morphine injection, nitroGLYCERIN, ondansetron (ZOFRAN) IV, ondansetron, DISCONTD: traMADol  Imaging: Dg Chest 2 View  02/15/2012  *RADIOLOGY REPORT*  Clinical Data: Short of breath  and dizziness.  CHEST - 2 VIEW  Comparison: 07/11/2011  Findings: Cardiac enlargement with AICD which is unchanged. Mild vascular congestion.  Prominent interstitial markings may represent mild interstitial edema.  Small left pleural effusion.  Negative for pneumonia.  IMPRESSION: Cardiac enlargement with mild interstitial edema due to fluid overload.  Original Report Authenticated By: Camelia Phenes, M.D.   Dg Abd Acute W/chest  02/16/2012  *RADIOLOGY REPORT*  Clinical Data: Abdominal pain, constipation.  ACUTE ABDOMEN SERIES (ABDOMEN 2 VIEW & CHEST 1 VIEW)  Comparison: 02/15/2012 chest radiograph  Findings: Retrocardiac opacity.  Cardiomegaly.  Aortic arch atherosclerosis.  Left chest Madalynne Gutmann battery pack with single lead projecting over the right ventricle.  Coarse interstitial markings.  Mild gaseous distension of small and large bowel loops in a nonspecific pattern however no overt evidence for obstruction.  IVC filter projects over the right margin of the L4-5 vertebral bodies/disc space. Decubitus radiographs demonstrate no definitive evidence for free intraperitoneal air.  IMPRESSION: Nonspecific bowel gas pattern without evidence for obstruction or free intraperitoneal air.  Cardiomegaly with retrocardiac opacity, keeping with a pleural effusion with associate consolidation; atelectasis versus infiltrate.  Original Report Authenticated By: Waneta Martins, M.D.   Assessment/Plan:  1. Acute Popliteal artery thrombus: Dr Edilia Bo performed an embolectomy yesterday and retrieved a large clot from the popliteal artery. She was initially managed on heparin drip.  TEE has not been suggested secondary to the fact that it would not change the management in this case. There is Significant risk reduction in stroke/ emboli in afib with the novel anticoagulation medications compared to warfarin as well as significant reduction in intercranial bleed.  With the DVT as well Xarelto would be indicated because it has  indications for both at this time.  15 mg BID for 21 days then change to 20 mg daily PO.  2. Lower extremity DVT: Noted on dopplers.  Management as per #1  3. ACS CHF/AI: EF 20% per echo with severe diffuse hypokinesis throughout. LA moderately to severely dilated, moderate to severe regurg. Diuresis yesterday helped her breathing and would suggest one more day of diuresis today with 40 mg of Lasix today.    4. HTN: better controlled today. Increase losartan and watch especially with diuresis.   5. RI: Creatinine down some but her GFR is essentially unchanged. Will continue to monitor.   Length of Stay: 3 days  Patient history and plan of care reviewed with attending, Dr. Jennelle Human, M.D. 02/17/2012, 6:59 AM Patient examined and agree except changes made.  Valera Castle, MD 02/17/2012 8:25 AM  .  Jesse Sans. Daleen Squibb, MD, [facility] Pager:  [phone]

## 2012-02-17 NOTE — Evaluation (Signed)
Physical Therapy Evaluation Patient Details Name: [name] MRN: [id number] DOB: [birth year] Today's Date: 02/17/2012 Time: 4696-2952 PT Time Calculation (min): 32 min  PT Assessment / Plan / Recommendation Clinical Impression  pt s/p politeal embolectomy on L LE.  Mobility limited by pain and bil weakness, premorbid and due to pain.  Recommend SNF if pt does not make a quick recovery as she will not have assistance days.  HHPT if she can get assistance at home.    PT Assessment  Patient needs continued PT services    Follow Up Recommendations  Home health PT;Skilled nursing facility;Supervision for mobility/OOB;Supervision - Intermittent    Barriers to Discharge Decreased caregiver support (daughter works days and may not be able to take off.)      lEquipment Recommendations  None recommended by PT    Recommendations for Other Services     Frequency Min 3X/week    Precautions / Restrictions Restrictions Weight Bearing Restrictions: No   Pertinent Vitals/Pain       Mobility  Bed Mobility Bed Mobility: Supine to Sit;Sitting - Scoot to Edge of Bed Supine to Sit: 4: Min assist Sitting - Scoot to [place] of Bed: 4: Min guard Details for Bed Mobility Assistance: vc's for best technique ie bridging to EOB and up; minimal assist to sit Transfers Transfers: Sit to Stand;Stand to [employer] Transfers Sit to Stand: 4: Min guard Stand to Sit: 4: Min assist Stand Pivot Transfers: 3: Mod assist Details for Transfer Assistance: vc's for hand placement; control of descent; maneuvering assist for RW and supporting assist during pivot and WB on L LE Ambulation/Gait Stairs: No Wheelchair Mobility Wheelchair Mobility: No    Exercises     PT Diagnosis: Difficulty walking;Acute pain  PT Problem List: Decreased strength;Decreased activity tolerance;Decreased mobility;Decreased knowledge of use of DME;Cardiopulmonary status limiting activity;Pain PT Treatment Interventions: DME  instruction;Gait training;Stair training;Functional mobility training;Therapeutic activities;Patient/family education   PT Goals Acute Rehab PT Goals PT Goal Formulation: With patient Time For Goal Achievement: 02/24/12 Potential to Achieve Goals: Good Pt will go Supine/Side to Sit: with modified independence PT Goal: Supine/Side to Sit - Progress: Goal set today Pt will go Sit to Stand: with modified independence PT Goal: Sit to Stand - Progress: Goal set today Pt will Transfer Bed to Chair/Chair to Bed: with modified independence PT Transfer Goal: Bed to Chair/Chair to Bed - Progress: Goal set today Pt will Ambulate: 16 - 50 feet;with modified independence;with least restrictive assistive device;with rolling walker PT Goal: Ambulate - Progress: Goal set today Pt will Go Up / Down Stairs: 3-5 stairs;with supervision;with rail(s);with least restrictive assistive device;Other (comment) (if to go directly home only) PT Goal: Up/Down Stairs - Progress: Goal set today  Visit Information  Last PT Received On: 02/17/12 Assistance Needed: +2 (safety)    Subjective Data  Subjective: I am so tired.. I haven't been able to get any sleep Patient Stated Goal: home independent   Prior Functioning  Home Living Lives With: Daughter Available Help at Discharge: Family Type of Home: House Home Access: Stairs to enter Secretary/administrator of Steps: 3 Entrance Stairs-Rails: Right;Left Home Layout: Multi-level Alternate Level Stairs-Number of Steps: 3 Alternate Level Stairs-Rails: Right;Left Bathroom Shower/Tub: Health visitor: Standard Home Adaptive Equipment: Shower chair without back;Walker - rolling;Walker - standard;Bedside commode/3-in-1 Prior Function Level of Independence: Independent Able to Take Stairs?: Yes Driving: Yes Communication Communication: No difficulties    Cognition  Overall Cognitive Status: Appears within functional limits for tasks  assessed/performed  Arousal/Alertness: Awake/alert Orientation Level: Oriented X4 / Intact Behavior During Session: WFL for tasks performed    Extremity/Trunk Assessment Right Upper Extremity Assessment RUE ROM/Strength/Tone: Within functional levels Left Upper Extremity Assessment LUE ROM/Strength/Tone: Within functional levels Right Lower Extremity Assessment RLE ROM/Strength/Tone: WFL for tasks assessed Left Lower Extremity Assessment LLE ROM/Strength/Tone: Deficits LLE ROM/Strength/Tone Deficits: AAROM to ~60 degrees flexion; grossly >3/5 and painful   Balance    End of Session PT - End of Session Activity Tolerance: Patient limited by pain Patient left: in chair;with nursing in room Nurse Communication: Mobility status   Jaevion Goto, Eliseo Gum 02/17/2012, 5:37 PM  02/17/2012  [place], PT [phone] (pager)

## 2012-02-17 NOTE — Progress Notes (Signed)
Subjective: Pt looks exhausted. Her main concern is constipation not relieved by miralax or colace senna.  Her leg pain is tolerable. Arterial line was removed yesterday.  Appetite is not great.    Objective: Weight change:   Intake/Output Summary (Last 24 hours) at 02/17/12 1153 Last data filed at 02/17/12 1025  Gross per 24 hour  Intake  880.5 ml  Output   1890 ml  Net -1009.5 ml    Filed Vitals:   02/17/12 1000  BP: 109/43  Pulse: 72  Temp:   Resp: 17    Physical Exam HEENT: PERRL, EOMI, no scleral icterus  Cardiac: irregularly irregular rhythm, 3/6 early diastolic murmur, no rubs, or gallops  Pulm: clear to auscultation bilaterally, no wheezes, rales, or rhonchi  Abd: soft, mildly diffusely tender, nondistended, BS present  Ext: warm ,  Mild tenderness of the left lower extremity. no pedal edema, bandage in place on left leg. CDI.  Neuro: alert and oriented X3, cranial nerves II-XII grossly intact, strength and sensation to light touch equal in bilateral upper and lower extremities      Lab Results: Results for orders placed during the hospital encounter of 02/14/12 (from the past 24 hour(s))  CBC     Status: Abnormal   Collection Time   02/17/12  5:57 AM      Component Value Range   WBC 8.9  4.0 - 10.5 (K/uL)   RBC 3.13 (*) 3.87 - 5.11 (MIL/uL)   Hemoglobin 9.6 (*) 12.0 - 15.0 (g/dL)   HCT 16.1 (*) 09.6 - 46.0 (%)   MCV 93.6  78.0 - 100.0 (fL)   MCH 30.7  26.0 - 34.0 (pg)   MCHC 32.8  30.0 - 36.0 (g/dL)   RDW 04.5  40.9 - 81.1 (%)   Platelets 163  150 - 400 (K/uL)  BASIC METABOLIC PANEL     Status: Abnormal   Collection Time   02/17/12  5:57 AM      Component Value Range   Sodium 138  135 - 145 (mEq/L)   Potassium 3.5  3.5 - 5.1 (mEq/L)   Chloride 102  96 - 112 (mEq/L)   CO2 26  19 - 32 (mEq/L)   Glucose, Bld 95  70 - 99 (mg/dL)   BUN 19  6 - 23 (mg/dL)   Creatinine, Ser 9.14 (*) 0.50 - 1.10 (mg/dL)   Calcium 8.4  8.4 - 78.2 (mg/dL)   GFR calc non Af  Amer 49 (*) >90 (mL/min)   GFR calc Af Amer 57 (*) >90 (mL/min)     Micro Results: Recent Results (from the past 240 hour(s))  MRSA PCR SCREENING     Status: Normal   Collection Time   02/15/12  7:00 PM      Component Value Range Status Comment   MRSA by PCR NEGATIVE  NEGATIVE  Final     Studies/Results: Dg Chest 2 View  02/15/2012  *RADIOLOGY REPORT*  Clinical Data: Short of breath and dizziness.  CHEST - 2 VIEW  Comparison: 07/11/2011  Findings: Cardiac enlargement with AICD which is unchanged. Mild vascular congestion.  Prominent interstitial markings may represent mild interstitial edema.  Small left pleural effusion.  Negative for pneumonia.  IMPRESSION: Cardiac enlargement with mild interstitial edema due to fluid overload.  Original Report Authenticated By: Camelia Phenes, M.D.   Dg Abd Acute W/chest  02/16/2012  *RADIOLOGY REPORT*  Clinical Data: Abdominal pain, constipation.  ACUTE ABDOMEN SERIES (ABDOMEN 2 VIEW & CHEST 1 VIEW)  Comparison: 02/15/2012 chest radiograph  Findings: Retrocardiac opacity.  Cardiomegaly.  Aortic arch atherosclerosis.  Left chest wall battery pack with single lead projecting over the right ventricle.  Coarse interstitial markings.  Mild gaseous distension of small and large bowel loops in a nonspecific pattern however no overt evidence for obstruction.  IVC filter projects over the right margin of the L4-5 vertebral bodies/disc space. Decubitus radiographs demonstrate no definitive evidence for free intraperitoneal air.  IMPRESSION: Nonspecific bowel gas pattern without evidence for obstruction or free intraperitoneal air.  Cardiomegaly with retrocardiac opacity, keeping with a pleural effusion with associate consolidation; atelectasis versus infiltrate.  Original Report Authenticated By: Waneta Martins, M.D.   Medications: Scheduled Meds:   . allopurinol  200 mg Oral Daily  . carvedilol  3.125 mg Oral BID WC  . furosemide  40 mg Intravenous Once  .  furosemide  40 mg Oral Daily  . losartan  50 mg Oral Daily  . magic mouthwash  5 mL Oral QID  . polyethylene glycol  17 g Oral Daily  . potassium chloride SA  20 mEq Oral Daily  . rivaroxaban  15 mg Oral BID WC   Followed by  . rivaroxaban  20 mg Oral Q supper  . senna-docusate  2 tablet Oral BID  . sodium chloride  3 mL Intravenous Q12H  . sodium phosphate  1 enema Rectal Once  . DISCONTD: aspirin  325 mg Oral Daily  . DISCONTD: rivaroxaban  20 mg Oral Daily  . DISCONTD: rivaroxaban  15 mg Oral BID WC   Continuous Infusions:   . sodium chloride 10 mL/hr at 02/16/12 1000  . 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 She was evaluated in ED, vascular surgery was called, suggested she might have had an embolic event to the left leg from the Atrial fibrillation and recommended IV anticoagulation. At first she was reluctant to start IV anticoagulation, with a prior history of Hemorrhagic stroke from the coumadin 13 years ago, but eventually agreed.  Cardiology consult was obtained for management of Heart failure and anticoagulation.  On 02/15/2012, she underwent left popliteal and tibial embolectomy with  Bovine pericardial patch angioplasty of the popliteal and posterior tibial artery by vascular and was transferred to 2902 for closer monitoring. Her venous dopplers showed a partially occlusive DVT in the mid PTV of the LLE.  2D echo showered worsening of the LV EF to 20%, with diffuse  hypokinesis. she has an ICD placed from before. Cardiology is managing her heart failure.   Assessment/Plan: Patient Active Hospital Problem List:  Left lower Extremity Embolus: s/p  Left popliteal and tibial embolectomy with angioplasty. -Post op Day 2. Physical therapy ordered and plan for ambulation as tolerated.  -JP Drain can be removed when the drainage is less than 30 ml.  -Continue with IV anticoagulation and 15 mg of Xarelto daily.   Left lower Extremity DVT: -on IV heparin and Xarelto daily.  Acute on Chronic Systolic Heart Failure: - on oral lasix 40mg  daily with potassium supplementation -continue with coreg, cozaar. - Further recommendations  as per Cardiology.  Hypertension:  -better controlled. - continue with cozaar and coreg.  Stage 2 CKD:  - Slightly worse today probably from the iv diuresis the last two days.   Atrial Fibrillation:  rate controlled. On IV heparin and Xarelto.   Abdominal Pain - probably from constipation - No bowel movement in 3 days, she is passing gas, with good bowel sounds on exam. - KUB ordered to evaluate for obstruction  -fleet enema after obstruction is ruled out - continue with miralax and stool softners.  - continue to monitor.  Anemia:  -normocytic, probably anemia  From chronic diseases.  Anemia panel ordered.  DVT prophylaxis. Transfer Pt to Telemetry.     LOS: 3 days   Falicia Lizotte 02/17/2012, 11:53 AM

## 2012-02-18 DIAGNOSIS — I10 Essential (primary) hypertension: Secondary | ICD-10-CM

## 2012-02-18 DIAGNOSIS — I739 Peripheral vascular disease, unspecified: Secondary | ICD-10-CM

## 2012-02-18 DIAGNOSIS — I5022 Chronic systolic (congestive) heart failure: Secondary | ICD-10-CM

## 2012-02-18 DIAGNOSIS — D649 Anemia, unspecified: Secondary | ICD-10-CM

## 2012-02-18 LAB — FOLATE: Folate: 16.3 ng/mL

## 2012-02-18 LAB — FERRITIN: Ferritin: 84 ng/mL (ref 10–291)

## 2012-02-18 LAB — BASIC METABOLIC PANEL
BUN: 18 mg/dL (ref 6–23)
Creatinine, Ser: 0.99 mg/dL (ref 0.50–1.10)
GFR calc Af Amer: 65 mL/min — ABNORMAL LOW (ref >90)
GFR calc non Af Amer: 56 mL/min — ABNORMAL LOW (ref >90)
Potassium: 3.5 mEq/L (ref 3.5–5.1)

## 2012-02-18 LAB — IRON AND TIBC: UIBC: 281 ug/dL (ref 125–400)

## 2012-02-18 LAB — CBC
HCT: 29.3 % — ABNORMAL LOW (ref 36.0–46.0)
MCHC: 32.8 g/dL (ref 30.0–36.0)
RDW: 14.8 % (ref 11.5–15.5)

## 2012-02-18 LAB — RETICULOCYTES
RBC.: 3.14 MIL/uL — ABNORMAL LOW (ref 3.87–5.11)
Retic Count, Absolute: 69.1 10*3/uL (ref 19.0–186.0)
Retic Ct Pct: 2.2 % (ref 0.4–3.1)

## 2012-02-18 NOTE — Progress Notes (Signed)
Subjective: Feels much better. LLE pain is less. Feels a bit "tired".  Objective: Vital signs in last 24 hours: Temp:  [97.6 F (36.4 C)-98.2 F (36.8 C)] 97.9 F (36.6 C) (05/22 0400) Pulse Rate:  [67-91] 67  (05/22 0400) Resp:  [18-20] 20  (05/22 0400) BP: (109-151)/(41-83) 131/70 mmHg (05/22 0400) SpO2:  [89 %-100 %] 100 % (05/22 0400) Weight change:  Last BM Date: 02/17/12  Intake/Output from previous day: 05/21 0701 - 05/22 0700 In: 486 [P.O.:480; I.V.:6] Out: 885 [Urine:870; Drains:15] Total I/O In: -  Out: 10 [Drains:10]   Physical Exam: General: Comfortable, alert, communicative, fully oriented, not short of breath at rest.  HEENT:  Mild clinical pallor, no jaundice, no conjunctival injection or discharge. NECK:  Supple, JVP not seen, no carotid bruits, no palpable lymphadenopathy, no palpable goiter. CHEST:  Clinically clear to auscultation, no wheezes, no crackles. HEART:  Sounds 1 and 2 heard, normal, irregular, no murmurs. ABDOMEN:  Moderately obese, soft, non-tender, no palpable organomegaly, no palpable masses, normal bowel sounds. GENITALIA:  Not examioned. LOWER EXTREMITIES:  Mild pitting edema, palpable peripheral pulses. Surgical scars on left leg, appear to be healing well.  MUSCULOSKELETAL SYSTEM:  Generalized osteoarthritic changes, otherwise, normal. CENTRAL NERVOUS SYSTEM:  No focal neurologic deficit on gross examination.  Lab Results:  [facility] 02/18/12 0705 02/17/12 0557  WBC 8.1 8.9  HGB 9.6* 9.6*  HCT 29.3* 29.3*  PLT 182 163    Basename 02/18/12 0705 02/17/12 0557  NA 139 138  K 3.5 3.5  CL 102 102  CO2 25 26  GLUCOSE 99 95  BUN 18 19  CREATININE 0.99 1.11*  CALCIUM 8.9 8.4   Recent Results (from the past 240 hour(s))  MRSA PCR SCREENING     Status: Normal   Collection Time   02/15/12  7:00 PM      Component Value Range Status Comment   MRSA by PCR NEGATIVE  NEGATIVE  Final      Studies/Results: Dg Abd Acute  W/chest  02/16/2012  *RADIOLOGY REPORT*  Clinical Data: Abdominal pain, constipation.  ACUTE ABDOMEN SERIES (ABDOMEN 2 VIEW & CHEST 1 VIEW)  Comparison: 02/15/2012 chest radiograph  Findings: Retrocardiac opacity.  Cardiomegaly.  Aortic arch atherosclerosis.  Left chest wall battery pack with single lead projecting over the right ventricle.  Coarse interstitial markings.  Mild gaseous distension of small and large bowel loops in a nonspecific pattern however no overt evidence for obstruction.  IVC filter projects over the right margin of the L4-5 vertebral bodies/disc space. Decubitus radiographs demonstrate no definitive evidence for free intraperitoneal air.  IMPRESSION: Nonspecific bowel gas pattern without evidence for obstruction or free intraperitoneal air.  Cardiomegaly with retrocardiac opacity, keeping with a pleural effusion with associate consolidation; atelectasis versus infiltrate.  Original Report Authenticated By: Waneta Martins, M.D.   Dg Abd Portable 1v  02/17/2012  *RADIOLOGY REPORT*  Clinical Data: Abdominal pain  PORTABLE ABDOMEN - 1 VIEW  Comparison: 02/16/2012  Findings: Mild distention of small and large  bowel loops is present.  IVC filter is stable.  No obvious free intraperitoneal gas.  Vascular calcifications noted.  IMPRESSION: Nonspecific distention of both small and large bowel.  Original Report Authenticated By: Donavan Burnet, M.D.    Medications: Scheduled Meds:   . allopurinol  200 mg Oral Daily  . carvedilol  3.125 mg Oral BID WC  . furosemide  40 mg Oral Daily  . losartan  50 mg Oral Daily  . magic mouthwash  5 mL Oral QID  . polyethylene glycol  17 g Oral Daily  . potassium chloride SA  20 mEq Oral Daily  . rivaroxaban  15 mg Oral BID WC   Followed by  . rivaroxaban  20 mg Oral Q supper  . senna-docusate  2 tablet Oral BID  . sodium chloride  3 mL Intravenous Q12H  . sodium phosphate  1 enema Rectal Once   Continuous Infusions:   . sodium chloride 10  mL/hr at 02/16/12 1000   PRN Meds:.acetaminophen, acetaminophen, ALPRAZolam, benzocaine, morphine injection, morphine injection, nitroGLYCERIN, ondansetron (ZOFRAN) IV, ondansetron  Assessment/Plan: 1. Left lower Extremity Embolus:  Patient was admitted with a one week history of left lower leg pain since 1 week. On suspicion of LLE ischemia, she was commenced on iv Heparin and 15 mg of Xarelto daily. Vascular surgery in put was requested, and consultation was provided by Dr Waverly Ferrari, who performed left popliteal and tibial embolectomy with bovine pericardial patch angioplasty of the popliteal and posterior tibial artery non 02/15/12. Patient is now on POD# 3, feels considerably better, and has been okayed for DC from vascular surgical viewpoint, once ambulant. Awaiting PT/OT.  2. Left lower Extremity DVT:  Patient underwent venous doppler on 02/15/12, which showed findings consistent with partially occlusive acute deep vein thrombosis involving the mid posterior tibial vein of the left lower extremity. Incidentally, there was thrombus noted in the mid to distal popliteal artery. Dampened monophasic waveforms noted in the PTA. She has been managed with IV heparin and Xarelto, as described above, but will be transitioned to monotherapy with Xarelto, on discharge. 3. Acute on Chronic Systolic Heart Failure:  Known history of chronic systolic congestive heart failure, NYHA class 3, which appears to be compensated at this time. Patient is s/p AICD. 2D echocardiogram of 02/15/12, showed moderately dilated left ventricle, ejection fraction of 20% and severe diffuse hypokinesis with absent systolic thickening of the inferior myocardium. There was moderate to severe regurgitation,the ascending aorta was mildly to moderately dilated, mild mitral; regurgitation, moderately to severely dilated left atrium, moderately dilated right atrium and PA peak pressure: 52mm Hg (S). She was managed with Lasix, Coreg and  ARB during this hospitalization, and has remained stable. Cardiology consultation was provided by Dr Rollene Rotunda. 4. Hypertension:  Controlled with Cozaar, Coreg and diuretics.  5. Stage 2 CKD:  Patient has known CKD 2. This has remained stable.  6. Atrial Fibrillation:  Patient's Atrial fibrillation, has remained rate-controlled.  7. Abdominal Pain:  Patient did have non-specific abdominal pain, due to constipation. Apparently, she had no bowel movement in the preceding 3 days. KUB of 02/16/12, showed nonspecific bowel gas pattern without evidence for obstruction or free intraperitoneal air. She responded to bowel regimen, with Miralax, stool softeners and Fleets enema.  - continue to monitor.  8. Anemia:  Normocytic, of chronic diseases. Hemoglobin has remained stable at 9.6-10.8.  Disp: Aim discharge in day or two. Await PT/OT recommendations.   LOS: 4 days   [name],CHRISTOPHER 02/18/2012, 11:50 AM

## 2012-02-18 NOTE — Progress Notes (Signed)
VASCULAR PROGRESS NOTE  SUBJECTIVE: No complaints  PHYSICAL EXAM: Filed Vitals:   02/17/12 1720 02/17/12 1735 02/17/12 1951 02/18/12 0400  BP:  151/83 122/67 131/70  Pulse:  91 82 67  Temp:  97.8 F (36.6 C) 98.2 F (36.8 C) 97.9 F (36.6 C)  TempSrc:  Oral Oral Oral  Resp:  18 20 20   Height:      Weight:      SpO2: 89% 100% 98% 100%   Left leg incision looks fine. Palpable left PT pulse JP = 15 cc last shift  LABS: Lab Results  Component Value Date   WBC 8.1 02/18/2012   HGB 9.6* 02/18/2012   HCT 29.3* 02/18/2012   MCV 93.3 02/18/2012   PLT 182 02/18/2012   Lab Results  Component Value Date   CREATININE 1.11* 02/17/2012   Lab Results  Component Value Date   INR 1.06 02/15/2012   CBG (last 3)   Basename 02/17/12 2053 02/15/12 1930  GLUCAP 138* 91   ASSESSMENT/PLAN: 1. 3 Days Post-Op s/p: Left popliteal and tibial embolectomy 2. D/C JP 3. On Xarelto 4. OK for D/C from Vascular standpoint once ambulating.  Waverly Ferrari, MD, FACS Beeper: [phone] 02/18/2012

## 2012-02-18 NOTE — Progress Notes (Signed)
ICD remote with ICM 

## 2012-02-19 DIAGNOSIS — D649 Anemia, unspecified: Secondary | ICD-10-CM

## 2012-02-19 DIAGNOSIS — R5381 Other malaise: Secondary | ICD-10-CM

## 2012-02-19 DIAGNOSIS — I5022 Chronic systolic (congestive) heart failure: Secondary | ICD-10-CM

## 2012-02-19 DIAGNOSIS — I5023 Acute on chronic systolic (congestive) heart failure: Secondary | ICD-10-CM

## 2012-02-19 DIAGNOSIS — I10 Essential (primary) hypertension: Secondary | ICD-10-CM

## 2012-02-19 DIAGNOSIS — I739 Peripheral vascular disease, unspecified: Secondary | ICD-10-CM

## 2012-02-19 LAB — CBC
MCH: 30.4 pg (ref 26.0–34.0)
MCHC: 32.1 g/dL (ref 30.0–36.0)
Platelets: 201 10*3/uL (ref 150–400)
RDW: 14.9 % (ref 11.5–15.5)

## 2012-02-19 LAB — BASIC METABOLIC PANEL
Calcium: 8.9 mg/dL (ref 8.4–10.5)
GFR calc non Af Amer: 54 mL/min — ABNORMAL LOW (ref >90)
Sodium: 140 mEq/L (ref 135–145)

## 2012-02-19 MED ORDER — OXYCODONE-ACETAMINOPHEN 5-325 MG PO TABS: 1.0000 | ORAL_TABLET | ORAL | Status: DC | PRN

## 2012-02-19 NOTE — Clinical Social Work Psychosocial (Signed)
     Clinical Social Work Department BRIEF PSYCHOSOCIAL ASSESSMENT 02/19/2012  Patient:  [name], [name]     Account Number:  [id number]     Admit date:  02/14/2012  Clinical Social Worker:  Robin Searing  Date/Time:  02/19/2012 03:00 PM  Referred by:  Physician  Date Referred:  02/19/2012 Referred for  SNF Placement   Other Referral:   Interview type:  Patient Other interview type:   SPoke with daughter by phone    PSYCHOSOCIAL DATA Living Status:  FAMILY Admitted from facility:   Level of care:   Primary support name:  DAUGHTER Primary support relationship to patient:  FAMILY Degree of support available:   Jones Skene- daughter [phone]    CURRENT CONCERNS Current Concerns  Post-Acute Placement   Other Concerns:    SOCIAL WORK ASSESSMENT / PLAN Patient and daughter are agreeable to plans- daughter works and so she will need to be independent and safe to be alone.   Assessment/plan status:  Other - See comment Other assessment/ plan:   FL2 and Pasarr completion   Information/referral to community resources:    PATIENTS/FAMILYS RESPONSE TO PLAN OF CARE: Patient and daughter are agreeable to this plan. Will proceed with SNF search, Blue Medicare auth. and advise.

## 2012-02-19 NOTE — Progress Notes (Signed)
Referral rec'd for ?SNF. Spoke with patient who states she plans to d/c home and not wanting to pursue SNF. RNCM advised of this and CSW will sign off- please reconsult if needs arise.  Reece Levy, MSW, Theresia Majors [phone]

## 2012-02-19 NOTE — Progress Notes (Signed)
Subjective: No new issues. Ambulating.  Objective: Vital signs in last 24 hours: Temp:  [97.3 F (36.3 C)-98.7 F (37.1 C)] 98.7 F (37.1 C) (05/23 0643) Pulse Rate:  [67-84] 67  (05/23 0643) Resp:  [18] 18  (05/23 0643) BP: (99-125)/(64-74) 125/66 mmHg (05/23 0643) SpO2:  [100 %] 100 % (05/23 1610) Weight change:  Last BM Date: 02/18/12  Intake/Output from previous day: 05/22 0701 - 05/23 0700 In: -  Out: 510 [Urine:500; Drains:10] Total I/O In: 440 [P.O.:440] Out: -    Physical Exam: General: Comfortable, alert, communicative, fully oriented, not short of breath at rest.  HEENT:  Mild clinical pallor, no jaundice, no conjunctival injection or discharge. NECK:  Supple, JVP not seen, no carotid bruits, no palpable lymphadenopathy, no palpable goiter. CHEST:  Clinically clear to auscultation, no wheezes, no crackles. HEART:  Sounds 1 and 2 heard, normal, irregular, no murmurs. ABDOMEN:  Moderately obese, soft, non-tender, no palpable organomegaly, no palpable masses, normal bowel sounds. GENITALIA:  Not examioned. LOWER EXTREMITIES:  Mild pitting edema, palpable peripheral pulses. Surgical scars on left leg, appear to be healing well.  MUSCULOSKELETAL SYSTEM:  Generalized osteoarthritic changes, otherwise, normal. CENTRAL NERVOUS SYSTEM:  No focal neurologic deficit on gross examination.  Lab Results:  Basename 02/19/12 0555 02/18/12 0705  WBC 6.9 8.1  HGB 9.3* 9.6*  HCT 29.0* 29.3*  PLT 201 182    Basename 02/19/12 0555 02/18/12 0705  NA 140 139  K 3.8 3.5  CL 102 102  CO2 27 25  GLUCOSE 85 99  BUN 21 18  CREATININE 1.03 0.99  CALCIUM 8.9 8.9   Recent Results (from the past 240 hour(s))  MRSA PCR SCREENING     Status: Normal   Collection Time   02/15/12  7:00 PM      Component Value Range Status Comment   MRSA by PCR NEGATIVE  NEGATIVE  Final      Studies/Results: Dg Abd Portable 1v  02/17/2012  *RADIOLOGY REPORT*  Clinical Data: Abdominal pain   PORTABLE ABDOMEN - 1 VIEW  Comparison: 02/16/2012  Findings: Mild distention of small and large  bowel loops is present.  IVC filter is stable.  No obvious free intraperitoneal gas.  Vascular calcifications noted.  IMPRESSION: Nonspecific distention of both small and large bowel.  Original Report Authenticated By: Donavan Burnet, M.D.    Medications: Scheduled Meds:    . allopurinol  200 mg Oral Daily  . carvedilol  3.125 mg Oral BID WC  . furosemide  40 mg Oral Daily  . losartan  50 mg Oral Daily  . magic mouthwash  5 mL Oral QID  . polyethylene glycol  17 g Oral Daily  . potassium chloride SA  20 mEq Oral Daily  . rivaroxaban  15 mg Oral BID WC   Followed by  . rivaroxaban  20 mg Oral Q supper  . senna-docusate  2 tablet Oral BID  . sodium chloride  3 mL Intravenous Q12H   Continuous Infusions:    . sodium chloride 10 mL/hr at 02/16/12 1000   PRN Meds:.acetaminophen, acetaminophen, ALPRAZolam, benzocaine, morphine injection, morphine injection, nitroGLYCERIN, ondansetron (ZOFRAN) IV, ondansetron  Assessment/Plan: 1. Left lower Extremity Embolus:  Patient was admitted with a one week history of left lower leg pain. On suspicion of LLE ischemia, she was commenced on iv Heparin and daily Xarelto. Vascular surgery in put was requested, and consultation was provided by Dr Waverly Ferrari, who performed left popliteal and tibial embolectomy with bovine  pericardial patch angioplasty of the popliteal and posterior tibial artery on 02/15/12. Patient is now on POD# 4, feels considerably better, and has been okayed for DC from vascular surgical viewpoint. PT/OT has evaluated patient, and have recommended SNF rehab. Patient appears reluctant.  2. Left lower Extremity DVT:  Patient underwent venous doppler on 02/15/12, which showed findings consistent with partially occlusive acute deep vein thrombosis involving the mid posterior tibial vein of the left lower extremity. Incidentally, there was  thrombus noted in the mid to distal popliteal artery. Dampened monophasic waveforms noted in the PTA. She was initially managed with IV heparin and Xarelto, as described above, but has now been transitioned to monotherapy with Xarelto, preparatory to discharge. 3. Acute on Chronic Systolic Heart Failure:  Known history of chronic systolic congestive heart failure, NYHA class 3, which appears to be compensated at this time. Patient is s/p AICD. 2D echocardiogram of 02/15/12, showed moderately dilated left ventricle, ejection fraction of 20% and severe diffuse hypokinesis with absent systolic thickening of the inferior myocardium. There was moderate to severe regurgitation,the ascending aorta was mildly to moderately dilated, mild mitral; regurgitation, moderately to severely dilated left atrium, moderately dilated right atrium and PA peak pressure: 52mm Hg (S). She was managed with Lasix, Coreg and ARB during this hospitalization, and has remained stable. Cardiology consultation was provided by Dr Rollene Rotunda. 4. Hypertension:  Controlled with Cozaar, Coreg and diuretics.  5. Stage 2 CKD:  Patient has known CKD 2. This has remained stable.  6. Atrial Fibrillation:  Patient's Atrial fibrillation, has remained rate-controlled.  7. Abdominal Pain:  Patient did have non-specific abdominal pain, due to constipation. Apparently, she had no bowel movement in the preceding 3 days. KUB of 02/16/12, showed nonspecific bowel gas pattern without evidence for obstruction or free intraperitoneal air. She responded to bowel regimen, with Miralax, stool softeners and Fleets enema.  8. Anemia:  Normocytic, of chronic diseases. Hemoglobin has remained stable at 9.6-10.8.  Disp: Aim discharge in day or two. Will place rehab consult for possible CIR..   LOS: 5 days   Shawnie Nicole,CHRISTOPHER 02/19/2012, 12:37 PM

## 2012-02-19 NOTE — Progress Notes (Addendum)
[facility] Progress Note  02/19/2012 7:44 AM POD 4  Subjective:  No complaints-increased ambulation some yesterday; no complaints of pain.  Afebrile x 24 hours  VSS Filed Vitals:   02/19/12 0643  BP: 125/66  Pulse: 67  Temp: 98.7 F (37.1 C)  Resp: 18    Physical Exam: Incisions:  C/d/i; drain site with minimal drainage Extremities:  +palpable PT left; LLE warm and well perfused.  CBC    Component Value Date/Time   WBC 6.9 02/19/2012 0555   RBC 3.06* 02/19/2012 0555   HGB 9.3* 02/19/2012 0555   HCT 29.0* 02/19/2012 0555   PLT 201 02/19/2012 0555   MCV 94.8 02/19/2012 0555   MCH 30.4 02/19/2012 0555   MCHC 32.1 02/19/2012 0555   RDW 14.9 02/19/2012 0555   LYMPHSABS 2.2 02/15/2012 0502   MONOABS 0.8 02/15/2012 0502   EOSABS 0.2 02/15/2012 0502   BASOSABS 0.0 02/15/2012 0502    BMET    Component Value Date/Time   NA 140 02/19/2012 0555   K 3.8 02/19/2012 0555   CL 102 02/19/2012 0555   CO2 27 02/19/2012 0555   GLUCOSE 85 02/19/2012 0555   BUN 21 02/19/2012 0555   CREATININE 1.03 02/19/2012 0555   CREATININE 1.39* 02/13/2012 0919   CALCIUM 8.9 02/19/2012 0555   GFRNONAA 54* 02/19/2012 0555   GFRAA 62* 02/19/2012 0555    INR    Component Value Date/Time   INR 1.06 02/15/2012 0502     Intake/Output Summary (Last 24 hours) at 02/19/12 0744 Last data filed at 02/18/12 1237  Gross per 24 hour  Intake      0 ml  Output    510 ml  Net   -510 ml     Assessment/Plan:  71 y.o. female is s/p Left popliteal and tibial embolectomy  POD 4 -Hgb stable-pt tolerating. -pt doing well from vascular standpoint.   -continue care per primary service  Doreatha Massed, PA-C [facility] [phone] 02/19/2012 7:44 AM  Palpable left PT pulse. Incision healing well.  Di Kindle. Edilia Bo, MD, FACS Beeper [phone] 02/19/2012

## 2012-02-19 NOTE — Progress Notes (Signed)
Physical Therapy Treatment Patient Details Name: [name] MRN: [id number] DOB: [birth year] Today's Date: 02/19/2012 Time: 4098-1191 PT Time Calculation (min): 31 min  PT Assessment / Plan / Recommendation Comments on Treatment Session  Pt s/p LLE embolectomy progressing with ambulation and activity but still recommend SNF. Discussed with pt discharge options and pt more understanding and agreeable to prospect of SNF due to decreased mobility and lack of 24hr care at home. Encouraged continued ambulation, OOB for meals, and HEP. Will follow    Follow Up Recommendations       Barriers to Discharge        Equipment Recommendations       Recommendations for Other Services    Frequency     Plan Discharge plan remains appropriate;Frequency remains appropriate    Precautions / Restrictions     Pertinent Vitals/Pain 2/10 soreness, surgical site Vitals stable    Mobility  Bed Mobility Bed Mobility: Supine to Sit;Sitting - Scoot to Edge of Bed Supine to Sit: 6: Modified independent (Device/Increase time);HOB elevated;With rails (HOB 15degrees) Sitting - Scoot to Edge of Bed: 6: Modified independent (Device/Increase time) Transfers Transfers: Sit to Stand;Stand to Sit Sit to Stand: 5: Supervision;From bed;From toilet Stand to Sit: To toilet;To chair/3-in-1;5: Supervision Details for Transfer Assistance: cueing for hand placement, sequence, safety and to control descent to low chair surface (pt plops) Ambulation/Gait Ambulation/Gait Assistance: 5: Supervision Ambulation Distance (Feet): 80 Feet Assistive device: Rolling walker Ambulation/Gait Assistance Details: pt maintains flexed posture with RW too far anterior to pt cueing throughout for stepping into RW, increased weight bearing through bil UE to ease gait and assist with posture Gait Pattern: Step-to pattern;Decreased stance time - left;Trunk flexed Gait velocity: decreased Stairs: No    Exercises General Exercises -  Lower Extremity Long Arc Quad: AROM;Both;15 reps;Seated Hip Flexion/Marching: AROM;Both;10 reps;Seated   PT Diagnosis:    PT Problem List:   PT Treatment Interventions:     PT Goals Acute Rehab PT Goals Pt will go Supine/Side to Sit: with modified independence;with HOB 0 degrees;Other (comment) (no rail) PT Goal: Supine/Side to Sit - Progress: Updated due to goal met PT Goal: Sit to Stand - Progress: Progressing toward goal PT Transfer Goal: Bed to Chair/Chair to Bed - Progress: Progressing toward goal Pt will Ambulate: 51 - 150 feet;with modified independence;with least restrictive assistive device PT Goal: Ambulate - Progress: Updated due to goal met PT Goal: Up/Down Stairs - Progress: Progressing toward goal  Visit Information  Last PT Received On: 02/19/12 Assistance Needed: +1    Subjective Data  Subjective: I have already walked 2x today   Cognition  Overall Cognitive Status: Appears within functional limits for tasks assessed/performed Arousal/Alertness: Awake/alert Orientation Level: Appears intact for tasks assessed Behavior During Session: [facility] for tasks performed    Balance     End of Session PT - End of Session Activity Tolerance: Patient tolerated treatment well Patient left: in chair;with call bell/phone within reach Nurse Communication: Mobility status    Delorse Lek 02/19/2012, 4:58 PM Delaney Meigs, PT [phone]

## 2012-02-19 NOTE — Clinical Social Work Placement (Signed)
     Clinical Social Work Department CLINICAL SOCIAL WORK PLACEMENT NOTE 02/20/2012  Patient:  [name], [name]  Account Number:  [id number] Admit date:  02/14/2012  Clinical Social Worker:  Robin Searing  Date/time:  02/19/2012 03:08 PM  Clinical Social Work is seeking post-discharge placement for this patient at the following level of care:   SKILLED NURSING   (*CSW will update this form in Epic as items are completed)   02/19/2012  Patient/family provided with [facility] Department of Clinical Social Works list of facilities offering this level of care within the geographic area requested by the patient (or if unable, by the patients family).  02/19/2012  Patient/family informed of their freedom to choose among providers that offer the needed level of care, that participate in Medicare, Medicaid or managed care program needed by the patient, have an available bed and are willing to accept the patient.  02/19/2012  Patient/family informed of [facility] ownership interest in [facility], as well as of the fact that they are under no obligation to receive care at this facility.  PASARR submitted to E[employer] on 02/19/2012 PASARR number received from [employer] on 02/20/2012  FL2 transmitted to all facilities in geographic area requested by pt/family on  02/19/2012 FL2 transmitted to all facilities within larger geographic area on   Patient informed that his/her managed care company has contracts with or will negotiate with  certain facilities, including the following:   PENDING BLUE MEDICARE AUTH     Patient/family informed of bed offers received:  02/20/2012 Patient chooses bed at [facility] Physician recommends and patient chooses bed at    Patient to be transferred to [facility] on  02/20/2012 Patient to be transferred to facility by EMS  The following physician request were entered in Epic:   Additional Comments:

## 2012-02-19 NOTE — Consult Note (Signed)
Physical Medicine and Rehabilitation Consult Reason for Consult: Deconditioning/left popliteal and tibial embolectomy Referring Phsyician: [name] is an 71 y.o. female.   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 She independent prior to admission and driving. Admitted 02/14/2012 with left lower extremity pain x1 week. She denied any chest pain palpitations or syncope. Venous Doppler studies showed findings of partially occlusive acute DVT involving the mid posterior tibial vein of the left lower extremity as well as thrombus mid to distal popliteal artery. Echocardiogram with ejection fraction of 20% severe diffuse hypokinesis. Underwent left popliteal and tibial embolectomy angioplasty of the popliteal and posterior tibial artery 02/15/2012 per Dr. Edilia Bo. Placed on xarelto 20 mg daily and monitored. Physical therapy ongoing M.D. as requested physical medicine rehabilitation consult to consider inpatient rehabilitation services Patient was up with physical therapy this afternoon. She ambulated without any physical contact. She went to the bathroom with a walker with physical therapy supervision Review of Systems  Cardiovascular: Positive for palpitations and leg swelling.  Musculoskeletal: Positive for joint pain.  All other systems reviewed and are negative.   Past Medical History  Diagnosis Date  . History of ventricular tachycardia   . DOE (dyspnea on exertion)   . Peripheral edema   . Aortic insufficiency     Moderate 2012  . 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 History of gout   . Exogenous obesity   . History of CHF (congestive heart failure)     EF 45% 2012, nonischemic (Normal coronaries 2002)  . Renal insufficiency   . Automatic implantable cardiac defibrillator  single-chamber-Medtronic 11/04/2011  . Hypertension   . Stroke    Past Surgical History  Procedure Date  . Cardiac defibrillator placement   . Cataract extraction   . Embolectomy 02/15/2012    Procedure: EMBOLECTOMY;  Surgeon: [name], MD;  Location: [facility] OR;  Service: Vascular;  Laterality: Left;  left popliteal embolectomy with drain placement   Family History  Problem Relation Age of Onset  . Hypertension Father   . Cancer Father   . Coronary artery disease Father 36   Social History:  reports that she has never smoked. She has never used smokeless tobacco. She reports that she does not drink alcohol or use illicit drugs. Allergies:  Allergies  Allergen Reactions  . Latex Rash  . Neomycin-Bacitracin Zn-Polymyx Rash   Medications Prior to Admission  Medication Sig Dispense Refill  . allopurinol (ZYLOPRIM) 100 MG tablet Take 100 mg by mouth as directed. 2 tablets daily       . aspirin 325 MG tablet Take 325 mg by mouth daily.        [name] BIOTIN PO Take 1 capsule by mouth daily.      . furosemide (LASIX) 40 MG tablet Take one tablet by mouth daily, except every third day take two tablets once daily  100 tablet  3  . losartan (COZAAR) 25 MG tablet Take 1 tablet (25 mg total) by mouth daily.  90 tablet  3  . potassium chloride SA (K-DUR,KLOR-CON) 20 MEQ tablet Take 1 tablet (20 mEq total) by mouth daily.  90 tablet  3  . nitroGLYCERIN (NITROSTAT) 0.4 MG SL tablet Place 1 tablet (0.4 mg total) under the tongue every 5 (five) minutes as needed.  25 tablet  prn  Home: Home Living Lives With: Daughter Available Help at Discharge: Family Type of Home: House Home Access: Stairs to enter Secretary/administrator of Steps: 3 Entrance Stairs-Rails: Right;Left Home Layout: Multi-level Alternate Level Stairs-Number of Steps: 3 Alternate Level Stairs-Rails: Right;Left Bathroom Shower/Tub: Health visitor: Standard Home Adaptive Equipment: Shower chair without  back;Walker - rolling;Walker - standard;Bedside commode/3-in-1  Functional History: Prior Function Able to Take Stairs?: Yes Driving: Yes Functional Status:  Mobility: Bed Mobility Bed Mobility: Supine to Sit;Sitting - Scoot to Edge of Bed Supine to Sit: 4: Min assist Sitting - Scoot to [place] of Bed: 4: Min guard Transfers Transfers: Sit to Stand;Stand to [employer] Transfers Sit to Stand: 4: Min guard Stand to Sit: 4: Min Multimedia programmer Transfers: 3: Mod assist Ambulation/Gait Stairs: No Wheelchair Mobility Wheelchair Mobility: No  ADL:    Cognition: Cognition Arousal/Alertness: Awake/alert Orientation Level: Oriented X4 Cognition Overall Cognitive Status: Appears within functional limits for tasks assessed/performed Arousal/Alertness: Awake/alert Orientation Level: Oriented X4 / Intact Behavior During Session: [facility] for tasks performed  Blood pressure 125/66, pulse 67, temperature 98.7 F (37.1 C), temperature source Oral, resp. rate 18, height 5\' 2"  (1.575 m), weight 82.101 kg (181 lb), SpO2 100.00%. Physical Exam  Vitals reviewed. Constitutional: She is oriented to person, place, and time. She appears well-developed.  HENT:  Head: Normocephalic.  Neck: Neck supple. No thyromegaly present.  Cardiovascular:       Cardiac rate control  Abdominal: She exhibits no distension. There is no tenderness.  Neurological: She is alert and oriented to person, place, and time.  Skin:       Surgical site clean and dry  Psychiatric: She has a normal mood and affect.    Results for orders placed during the hospital encounter of 02/14/12 (from the past 24 hour(s))  CBC     Status: Abnormal   Collection Time   02/19/12  5:55 AM      Component Value Range   WBC 6.9  4.0 - 10.5 (K/uL)   RBC 3.06 (*) 3.87 - 5.11 (MIL/uL)   Hemoglobin 9.3 (*) 12.0 - 15.0 (g/dL)   HCT 16.1 (*) 09.6 - 46.0 (%)   MCV 94.8  78.0 - 100.0 (fL)   MCH 30.4  26.0 - 34.0 (pg)   MCHC 32.1  30.0 -  36.0 (g/dL)   RDW 04.5  40.9 - 81.1 (%)   Platelets 201  150 - 400 (K/uL)  BASIC METABOLIC PANEL     Status: Abnormal   Collection Time   02/19/12  5:55 AM      Component Value Range   Sodium 140  135 - 145 (mEq/L)   Potassium 3.8  3.5 - 5.1 (mEq/L)   Chloride 102  96 - 112 (mEq/L)   CO2 27  19 - 32 (mEq/L)   Glucose, Bld 85  70 - 99 (mg/dL)   BUN 21  6 - 23 (mg/dL)   Creatinine, Ser 9.14  0.50 - 1.10 (mg/dL)   Calcium 8.9  8.4 - 78.2 (mg/dL)   GFR calc non Af Amer 54 (*) >90 (mL/min)   GFR calc Af Amer 62 (*) >90 (mL/min)   Dg Abd Portable 1v  02/17/2012  *RADIOLOGY REPORT*  Clinical Data: Abdominal pain  PORTABLE ABDOMEN - 1 VIEW  Comparison: 02/16/2012  Findings: Mild distention of small and large  bowel loops is present.  IVC filter is stable.  No obvious free intraperitoneal gas.  Vascular calcifications noted.  IMPRESSION: Nonspecific distention of  both small and large bowel.  Original Report Authenticated By: Donavan Burnet, M.D.    Assessment/Plan: Diagnosis: deconditioning do to acute DVT 1. Does the need for close, 24 hr/day medical supervision in concert with the patient's rehab needs make it unreasonable for this patient to be served in a less intensive setting? No 2. Co-Morbidities requiring supervision/potential complications: atrial fibrillation, congestive heart failure, chronic renal insufficiency 3. Due to safety, does the patient require 24 hr/day rehab nursing? No 4. Does the patient require coordinated care of a physician, rehab nurse, not applicable to address physical and functional deficits in the context of the above medical diagnosis(es)? No Addressing deficits in the following areas: not applicable 5. Can the patient actively participate in an intensive therapy program of at least 3 hrs of therapy per day at least 5 days per week? Yes 6. The potential for patient to make measurable gains while on inpatient rehab is fair 7. Anticipated functional outcomes upon  discharge from inpatient rehab are not applicable with PT, not applicable with OT, not applicable with SLP. 8. Estimated rehab length of stay to reach the above functional goals is: not applicable 9. Does the patient have adequate social supports to accommodate these discharge functional goals? Potentially 10. Anticipated D/C setting: Home 11. Anticipated post D/C treatments: HH therapy 12. Overall Rehab/Functional Prognosis: good  RECOMMENDATIONS: This patient's condition is appropriate for continued rehabilitative care in the following setting: [facility] Patient has agreed to participate in recommended program. Potentially Note that insurance prior authorization may be required for reimbursement for recommended care.  Comment:the patient and family are not comfortable with intermittent supervision level skilled nursing facility short-term may be an option. Patient is too high level for inpatient rehabilitation   ANGIULLI,DANIEL J. 02/19/2012

## 2012-02-19 NOTE — Progress Notes (Signed)
Subjective:  General malaise; no chest pain or dyspnea; no abdominal pain. Objective:  Vital Signs in the last 24 hours: Filed Vitals:   02/18/12 0400 02/18/12 1329 02/18/12 2144 02/19/12 0643  BP: 131/70 99/64 125/74 125/66  Pulse: 67 84 70 67  Temp: 97.9 F (36.6 C) 97.3 F (36.3 C) 97.5 F (36.4 C) 98.7 F (37.1 C)  TempSrc: Oral Oral Oral Oral  Resp: 20 18 18 18   Height:      Weight:      SpO2: 100% 100% 100% 100%   Intake/Output from previous day: 05/22 0701 - 05/23 0700 In: -  Out: 510 [Urine:500; Drains:10]  24-hour weight change: Weight change:   Weight trends: Filed Weights   02/14/12 1908 02/15/12 0452 02/15/12 1930  Weight: 77.565 kg (171 lb) 82.3 kg (181 lb 7 oz) 82.101 kg (181 lb)   Physical Exam: Vitals reviewed. General: resting in bed, NAD HEENT: normal Cardiac: irregularly irregular rhythm, 3/6 diastolic rumble, 3/6 systolic murmur, no rubs, or gallops Pulm: clear to auscultation bilaterally, no wheezes, rales, or rhonchi Abd: soft, nontender, nondistended, BS present Ext: warm and well perfused, 1+ pedal edema Neuro: grossly intact  Lab Results:  Basename 02/19/12 0555 02/18/12 0705  WBC 6.9 8.1  HGB 9.3* 9.6*  PLT 201 182    Basename 02/19/12 0555 02/18/12 0705  NA 140 139  K 3.8 3.5  CL 102 102  CO2 27 25  GLUCOSE 85 99  BUN 21 18  CREATININE 1.03 0.99    Basename 02/17/12 2053  GLUCAP 138*   2D echo:  - Left ventricle: The cavity size was moderately dilated. Wall thickness was normal. Systolic function was severely reduced. The estimated ejection fraction was 20%. Severe diffuse hypokinesis with absent systolic thickening of the inferior myocardium. - Aortic valve: Moderate to severe regurgitation. - Ascending aorta: The ascending aorta was mildly to moderately dilated. Linear echo in the ascending aorta likely artifact, but dissection cannot be unequivocally excluded. - Mitral valve: Mild regurgitation. - Left  atrium: The atrium was moderately to severely dilated. - Right atrium: The atrium was moderately dilated. - Atrial septum: No defect or patent foramen ovale was identified. - Pulmonary arteries: PA peak pressure: 52mm Hg (S).  Impressions: - Compared to the previous study performed 12/23/10, LV and aortic root size have increased; further impairment of LV systolic function, worsening AI.  Tele: Reviewed, atrial fibrillation with controlled rate  Scheduled Meds:    . allopurinol  200 mg Oral Daily  . carvedilol  3.125 mg Oral BID WC  . furosemide  40 mg Oral Daily  . losartan  50 mg Oral Daily  . magic mouthwash  5 mL Oral QID  . polyethylene glycol  17 g Oral Daily  . potassium chloride SA  20 mEq Oral Daily  . rivaroxaban  15 mg Oral BID WC   Followed by  . rivaroxaban  20 mg Oral Q supper  . senna-docusate  2 tablet Oral BID  . sodium chloride  3 mL Intravenous Q12H   Continuous Infusions:    . sodium chloride 10 mL/hr at 02/16/12 1000   PRN Meds:.acetaminophen, acetaminophen, ALPRAZolam, benzocaine, morphine injection, morphine injection, nitroGLYCERIN, ondansetron (ZOFRAN) IV, ondansetron  Imaging: Dg Abd Portable 1v  02/17/2012  *RADIOLOGY REPORT*  Clinical Data: Abdominal pain  PORTABLE ABDOMEN - 1 VIEW  Comparison: 02/16/2012  Findings: Mild distention of small and large  bowel loops is present.  IVC filter is stable.  No  obvious free intraperitoneal gas.  Vascular calcifications noted.  IMPRESSION: Nonspecific distention of both small and large bowel.  Original Report Authenticated By: Donavan Burnet, M.D.   Assessment/Plan:  1. Acute Popliteal artery thrombus: Dr Edilia Bo performed an embolectomy and retrieved a large clot from the popliteal artery. She was initially managed on heparin drip. Now on Xarelto 15 mg BID for 21 days (treating DVT as well) then change to 20 mg daily PO.  2. Lower extremity DVT: Noted on dopplers.  Management as per #1  3. ACS CHF/AI:  EF 20% per echo with severe diffuse hypokinesis throughout. LA moderately to severely dilated, moderate to severe regurg. Continue ARB, coreg and present dose of lasix.  4. HTN: controlled.  5. AI/aortic root dilatation: Patient will need CTA or MRA of aortic root after she recovers from surgery with consideration of AVR (FU with Dr Patty Sermons after DC for this). She will also need BMET and CBC 2 weeks after DC.  6. Atrial fibrillation: continue xeralto and coreg.  Length of Stay: 5 days  Olga Millers, M.D. 02/19/2012, 7:12 AM   .

## 2012-02-20 ENCOUNTER — Encounter: Payer: Medicare Other | Admitting: Internal Medicine

## 2012-02-20 DIAGNOSIS — I5022 Chronic systolic (congestive) heart failure: Secondary | ICD-10-CM

## 2012-02-20 DIAGNOSIS — I739 Peripheral vascular disease, unspecified: Secondary | ICD-10-CM

## 2012-02-20 DIAGNOSIS — D649 Anemia, unspecified: Secondary | ICD-10-CM

## 2012-02-20 DIAGNOSIS — I10 Essential (primary) hypertension: Secondary | ICD-10-CM

## 2012-02-20 LAB — CBC
Hemoglobin: 8.9 g/dL — ABNORMAL LOW (ref 12.0–15.0)
MCH: 30.2 pg (ref 26.0–34.0)
MCHC: 32.5 g/dL (ref 30.0–36.0)
RDW: 14.6 % (ref 11.5–15.5)

## 2012-02-20 LAB — BASIC METABOLIC PANEL
Calcium: 8.8 mg/dL (ref 8.4–10.5)
GFR calc Af Amer: 63 mL/min — ABNORMAL LOW (ref >90)
GFR calc non Af Amer: 54 mL/min — ABNORMAL LOW (ref >90)
Glucose, Bld: 96 mg/dL (ref 70–99)
Sodium: 140 mEq/L (ref 135–145)

## 2012-02-20 MED ORDER — ACETAMINOPHEN 325 MG PO TABS: 650.0000 mg | ORAL_TABLET | Freq: Four times a day (QID) | ORAL | Status: DC | PRN

## 2012-02-20 MED ORDER — OXYCODONE-ACETAMINOPHEN 5-325 MG PO TABS: 1.0000 | ORAL_TABLET | ORAL | Status: AC | PRN

## 2012-02-20 MED ORDER — ASPIRIN 81 MG PO TABS: 81.0000 mg | ORAL_TABLET | Freq: Every day | ORAL | Status: DC

## 2012-02-20 MED ORDER — ALPRAZOLAM 0.5 MG PO TABS: 0.5000 mg | ORAL_TABLET | Freq: Three times a day (TID) | ORAL | Status: AC | PRN

## 2012-02-20 MED ORDER — RIVAROXABAN 15 MG PO TABS: 15.0000 mg | ORAL_TABLET | Freq: Two times a day (BID) | ORAL | Status: DC

## 2012-02-20 MED ORDER — RIVAROXABAN 20 MG PO TABS: 20.0000 mg | ORAL_TABLET | Freq: Every day | ORAL | Status: DC

## 2012-02-20 MED ORDER — LOSARTAN POTASSIUM 25 MG PO TABS: 50.0000 mg | ORAL_TABLET | Freq: Every day | ORAL | Status: DC

## 2012-02-20 MED ORDER — CARVEDILOL 3.125 MG PO TABS: 3.1250 mg | ORAL_TABLET | Freq: Two times a day (BID) | ORAL | Status: DC

## 2012-02-20 MED ORDER — FUROSEMIDE 40 MG PO TABS: 40.0000 mg | ORAL_TABLET | Freq: Every day | ORAL | Status: DC

## 2012-02-20 MED ORDER — POLYETHYLENE GLYCOL 3350 17 G PO PACK: 17.0000 g | PACK | Freq: Every day | ORAL | Status: AC

## 2012-02-20 NOTE — Discharge Summary (Signed)
Physician Discharge Summary  Patient ID: [name] MRN: [id number] DOB/AGE: 1941/06/05 71 y.o.  Admit date: 02/14/2012 Discharge date: 02/20/2012  Primary Care Physician:  [name], [name], MD, MD   Discharge Diagnoses:    Patient Active Problem List  Diagnoses  . HYPERLIPIDEMIA  . GOUT  . HYPERKALEMIA  . HYPERTENSION  . Atrial fibrillation  . CONGESTIVE HEART FAILURE  . VENOUS INSUFFICIENCY  . BACK PAIN, LUMBAR  . MEMORY LOSS  . EDEMA  . FOOT PAIN, BILATERAL  . COMBINED HEART FAILURE, ACUTE ON CHRONIC  . SHORTNESS OF BREATH  . ALKALINE PHOSPHATASE, ELEVATED  . Aortic insufficiency and aortic stenosis  . Acute on chronic renal insufficiency  . Cardiac arrest  . Automatic implantable cardiac defibrillator single-chamber-Medtronic  . Alopecia  . Lower limb ischemia  . Acute on chronic systolic congestive heart failure, NYHA class 3  . Acute on chronic kidney disease, stage 3  . Constipation  . Anemia    Medication List  As of 02/20/2012  3:07 PM   TAKE these medications         acetaminophen 325 MG tablet   Commonly known as: TYLENOL   Take 2 tablets (650 mg total) by mouth every 6 (six) hours as needed (or Fever >/= 101).      allopurinol 100 MG tablet   Commonly known as: ZYLOPRIM   Take 100 mg by mouth as directed. 2 tablets daily      ALPRAZolam 0.5 MG tablet   Commonly known as: XANAX   Take 1 tablet (0.5 mg total) by mouth 3 (three) times daily as needed for anxiety.      aspirin 81 MG tablet   Take 1 tablet (81 mg total) by mouth daily.      BIOTIN PO   Take 1 capsule by mouth daily.      carvedilol 3.125 MG tablet   Commonly known as: COREG   Take 1 tablet (3.125 mg total) by mouth 2 (two) times daily with a meal.      furosemide 40 MG tablet   Commonly known as: LASIX   Take 1 tablet (40 mg total) by mouth daily.      losartan 25 MG tablet   Commonly known as: COZAAR   Take 2 tablets (50 mg total) by mouth daily.     nitroGLYCERIN 0.4 MG SL tablet   Commonly known as: NITROSTAT   Place 1 tablet (0.4 mg total) under the tongue every 5 (five) minutes as needed.      oxyCODONE-acetaminophen 5-325 MG per tablet   Commonly known as: PERCOCET   Take 1 tablet by mouth every 4 (four) hours as needed.      polyethylene glycol packet   Commonly known as: MIRALAX / GLYCOLAX   Take 17 g by mouth daily.      potassium chloride SA 20 MEQ tablet   Commonly known as: K-DUR,KLOR-CON   Take 1 tablet (20 mEq total) by mouth daily.      Rivaroxaban 15 MG Tabs tablet   Commonly known as: XARELTO   Take 1 tablet (15 mg total) by mouth 2 (two) times daily with a meal.      Rivaroxaban 20 MG Tabs   Take 20 mg by mouth daily with supper.   Start taking on: 03/08/2012             Disposition and Follow-up:  Follow up with primary MD, follow up with [facility]iology, and also with  Dr Waverly Ferrari, vascular surgeon.  Consults:  cardiology, rehabilitation medicine and vascular surgery Dr Waverly Ferrari, Vascular surgeon. Dr. Rollene Rotunda, Cardiologist. Dr Claudette Laws, Physical Medicine/Rehab.  Significant Diagnostic Studies:  Dg Chest 2 View  02/15/2012  *RADIOLOGY REPORT*  Clinical Data: Short of breath and dizziness.  CHEST - 2 VIEW  Comparison: 07/11/2011  Findings: Cardiac enlargement with AICD which is unchanged. Mild vascular congestion.  Prominent interstitial markings may represent mild interstitial edema.  Small left pleural effusion.  Negative for pneumonia.  IMPRESSION: Cardiac enlargement with mild interstitial edema due to fluid overload.  Original Report Authenticated By: Camelia Phenes, M.D.    Brief H and P: For complete details, refer to admission H and P. 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 She was admitted for further evaluation, investigation and management, on suspicion of left lower extremity ischemia.  Physical Exam: On 02/20/12.  General: Comfortable, alert, communicative, fully oriented, not short of breath at rest.  HEENT: Mild clinical pallor, no jaundice, no conjunctival injection or discharge.  NECK: Supple, JVP not seen, no carotid bruits, no palpable lymphadenopathy, no palpable goiter.  CHEST: Clinically clear to auscultation, no wheezes, no crackles.  HEART: Sounds 1 and 2 heard, normal, irregular, no murmurs.  ABDOMEN: Moderately obese, soft, non-tender, no palpable organomegaly, no palpable masses, normal bowel sounds.  GENITALIA: Not examioned.  LOWER EXTREMITIES: Mild pitting edema, palpable peripheral pulses. Surgical scars on left leg, appear to be healing well.  MUSCULOSKELETAL SYSTEM: Generalized osteoarthritic changes, otherwise, normal.  CENTRAL NERVOUS SYSTEM: No focal neurologic deficit on gross examination.  Hospital Course:  1. Left lower Extremity Embolus: Patient was admitted with a one week history of left lower leg pain. On suspicion of LLE ischemia, she was commenced on iv Heparin and daily Xarelto. Vascular surgery input was requested, and consultation was provided by Dr Waverly Ferrari, who performed left popliteal and tibial embolectomy with bovine pericardial patch angioplasty of the popliteal and posterior tibial artery on 02/15/12. Patient is now on POD# 5, as of 02/20/12, feels considerably better, has no appreciable pain, and is ambulating with assistance. She was evaluated by Dr Claudette Laws, Rehab MD, and level of functioning is too high for CIR,. SNF rehab, has been recommended. 2. Left lower Extremity DVT:  Patient underwent venous doppler on 02/15/12, which showed findings consistent with partially occlusive acute deep vein thrombosis involving the mid posterior tibial vein of the left  lower extremity. Incidentally, there was thrombus noted in the mid to distal popliteal artery. Dampened monophasic waveforms noted in the PTA. She was initially managed with IV heparin and Xarelto, as described above, but has now been transitioned to monotherapy with Xarelto, preparatory to discharge.  3. Acute on Chronic Systolic Heart Failure:  Known history of chronic systolic congestive heart failure, NYHA class 3, which appears to be compensated at this time. Patient is s/p AICD. 2D echocardiogram of 02/15/12, showed moderately dilated left ventricle, ejection fraction of 20% and severe diffuse hypokinesis with absent systolic thickening of the inferior myocardium. There was moderate to severe regurgitation,the ascending aorta was mildly to moderately dilated, mild mitral; regurgitation, moderately to severely dilated left atrium, moderately dilated right atrium and PA peak pressure: 52mm Hg (S). She was managed with Lasix, Coreg and ARB  during this hospitalization, and has remained stable, with no overt clinical evidence of decompensation. Cardiology consultation was provided by Dr Rollene Rotunda.  4. Hypertension:  Controlled with Cozaar, Coreg and diuretics.  5. Stage 2 CKD:  Patient has known CKD 2. This has remained stable. Creatinine was 1.02, on 02/20/12. 6. Atrial Fibrillation:  Patient's Atrial fibrillation, has remained rate-controlled.  7. Abdominal Pain:  Patient did have non-specific abdominal pain, due to constipation, in the initial few days of hospitalization. Apparently, she had no bowel movement in the preceding 3 days. KUB of 02/16/12, showed nonspecific bowel gas pattern without evidence for obstruction or free intraperitoneal air. She responded to bowel regimen, with Miralax, stool softeners and Fleets enema.  8. Anemia:  Normocytic, of chronic disease. Hemoglobin has remained stable at 8.9-10.8.   Comment: Patient is stable for discharge to SNF.  Time spent on Discharge: 40  mins.  Signed: Malonie Tatum,CHRISTOPHER 02/20/2012, 3:07 PM

## 2012-02-20 NOTE — Progress Notes (Signed)
Following up after Rehab MD consult. Patient is too high level for CIR and patient really wants to go home, but discussed with her and her dtr at bedside regarding safety and not having help once home during the day. Patient agreeable to SNF at this time. Thanks- CIR signing off. Toni Amend adm coordinator [phone]

## 2012-02-20 NOTE — Progress Notes (Signed)
Subjective:  no chest pain or dyspnea; no abdominal pain; LLE pain improving Objective:  Vital Signs in the last 24 hours: Filed Vitals:   02/19/12 0643 02/19/12 1414 02/19/12 1953 02/20/12 0519  BP: 125/66 122/61 127/71 111/66  Pulse: 67 125 89 84  Temp: 98.7 F (37.1 C) 98.7 F (37.1 C) 98.4 F (36.9 C) 97.5 F (36.4 C)  TempSrc: Oral  Oral Oral  Resp: 18 18 20 20   Height:      Weight:      SpO2: 100% 100% 96% 94%   Intake/Output from previous day: 05/23 0701 - 05/24 0700 In: 930 [P.O.:930] Out: 200 [Urine:200]  24-hour weight change: Weight change:   Weight trends: Filed Weights   02/14/12 1908 02/15/12 0452 02/15/12 1930  Weight: 77.565 kg (171 lb) 82.3 kg (181 lb 7 oz) 82.101 kg (181 lb)   Physical Exam: Vitals reviewed. General: resting in bed, NAD HEENT: normal Cardiac: irregularly irregular rhythm, 3/6 diastolic rumble, 3/6 systolic murmur, no rubs, or gallops Pulm: clear to auscultation bilaterally, no wheezes, rales, or rhonchi Abd: soft, nontender, nondistended, BS present Ext: warm and well perfused, trace pedal edema, s/p LLE surgery. Neuro: grossly intact  Lab Results:  Basename 02/20/12 0617 02/19/12 0555  WBC 7.0 6.9  HGB 8.9* 9.3*  PLT 226 201    Basename 02/19/12 0555 02/18/12 0705  NA 140 139  K 3.8 3.5  CL 102 102  CO2 27 25  GLUCOSE 85 99  BUN 21 18  CREATININE 1.03 0.99    Basename 02/17/12 2053  GLUCAP 138*   2D echo:  - Left ventricle: The cavity size was moderately dilated. Wall thickness was normal. Systolic function was severely reduced. The estimated ejection fraction was 20%. Severe diffuse hypokinesis with absent systolic thickening of the inferior myocardium. - Aortic valve: Moderate to severe regurgitation. - Ascending aorta: The ascending aorta was mildly to moderately dilated. Linear echo in the ascending aorta likely artifact, but dissection cannot be unequivocally excluded. - Mitral valve: Mild  regurgitation. - Left atrium: The atrium was moderately to severely dilated. - Right atrium: The atrium was moderately dilated. - Atrial septum: No defect or patent foramen ovale was identified. - Pulmonary arteries: PA peak pressure: 52mm Hg (S).  Impressions: - Compared to the previous study performed 12/23/10, LV and aortic root size have increased; further impairment of LV systolic function, worsening AI.  Tele: Reviewed, atrial fibrillation with controlled rate  Scheduled Meds:    . allopurinol  200 mg Oral Daily  . carvedilol  3.125 mg Oral BID WC  . furosemide  40 mg Oral Daily  . losartan  50 mg Oral Daily  . magic mouthwash  5 mL Oral QID  . polyethylene glycol  17 g Oral Daily  . potassium chloride SA  20 mEq Oral Daily  . rivaroxaban  15 mg Oral BID WC   Followed by  . rivaroxaban  20 mg Oral Q supper  . senna-docusate  2 tablet Oral BID  . sodium chloride  3 mL Intravenous Q12H   Continuous Infusions:    . sodium chloride 10 mL/hr at 02/16/12 1000   PRN Meds:.acetaminophen, acetaminophen, ALPRAZolam, benzocaine, morphine injection, morphine injection, nitroGLYCERIN, ondansetron (ZOFRAN) IV, ondansetron, oxyCODONE-acetaminophen  Imaging: No results found. Assessment/Plan:  1. Acute Popliteal artery thrombus: Dr Edilia Bo performed an embolectomy and retrieved a large clot from the popliteal artery. She was initially managed on heparin drip. Now on Xarelto 15 mg BID for 21  days (treating DVT as well) then change to 20 mg daily PO.  2. Lower extremity DVT: Noted on dopplers.  Management as per #1  3. ACS CHF/AI: EF 20% per echo with severe diffuse hypokinesis throughout. LA moderately to severely dilated, moderate to severe regurg. Continue ARB, coreg and present dose of lasix.  4. HTN: controlled.  5. AI/aortic root dilatation: Patient will need CTA or MRA of aortic root after she recovers from surgery with consideration of AVR (FU with Dr Patty Sermons after DC  for this). She will also need BMET and CBC 2 weeks after DC.  6. Atrial fibrillation: continue xeralto and coreg.  Length of Stay: 6 days  [name], M.D. 02/20/2012, 7:33 AM   .

## 2012-02-20 NOTE — Progress Notes (Signed)
VASCULAR PROGRESS NOTE  SUBJECTIVE: No complaints  PHYSICAL EXAM: Filed Vitals:   02/19/12 0643 02/19/12 1414 02/19/12 1953 02/20/12 0519  BP: 125/66 122/61 127/71 111/66  Pulse: 67 125 89 84  Temp: 98.7 F (37.1 C) 98.7 F (37.1 C) 98.4 F (36.9 C) 97.5 F (36.4 C)  TempSrc: Oral  Oral Oral  Resp: 18 18 20 20   Height:      Weight:      SpO2: 100% 100% 96% 94%    Palpable left PT pulse  LABS: Lab Results  Component Value Date   WBC 7.0 02/20/2012   HGB 8.9* 02/20/2012   HCT 27.4* 02/20/2012   MCV 92.9 02/20/2012   PLT 226 02/20/2012   Lab Results  Component Value Date   CREATININE 1.02 02/20/2012   Lab Results  Component Value Date   INR 1.06 02/15/2012   CBG (last 3)   Basename 02/17/12 2053  GLUCAP 138*     ASSESSMENT/PLAN: 1. 5 Days Post-Op 2. Doing well  Waverly Ferrari, MD, FACS Beeper: [phone] 02/20/2012

## 2012-02-20 NOTE — Progress Notes (Signed)
Patient has selected SNF bed at [facility]- awaiting callback from [facility] authorization for SNF approval. Patient and daughter agreeable to these plans.  Will advise- Reece Levy, MSW, Theresia Majors [phone]

## 2012-02-20 NOTE — Progress Notes (Signed)
SNF auth rec'd for [facility] transfer today.Patient and daughter agreeable to plans. Plan for d/c today via daughter's vehicle. Reece Levy, MSW, Theresia Majors [phone]

## 2012-02-20 NOTE — Evaluation (Signed)
Occupational Therapy Evaluation Patient Details Name: [name] MRN: [id number] DOB: 1941-04-23 Today's Date: 02/20/2012 Time: 4098-1191 OT Time Calculation (min): 13 min  OT Assessment / Plan / Recommendation Clinical Impression  Pt s/p LLE embolectomy and would benefit from skilled OT to maximize independence with BADLs to mod I level in prep for d/c to next venue of care or home with 24/7 supervision and HHOT.    OT Assessment  Patient needs continued OT Services    Follow Up Recommendations  Skilled nursing facility;Supervision/Assistance - 24 hour    Barriers to Discharge Decreased caregiver support    Equipment Recommendations  None recommended by OT    Recommendations for Other Services    Frequency  Min 2X/week    Precautions / Restrictions Precautions Precautions: Fall   Pertinent Vitals/Pain     ADL  Grooming: Performed;Wash/dry hands;Min guard Where Assessed - Grooming: Supported standing Upper Body Bathing: Simulated;Set up Where Assessed - Upper Body Bathing: Unsupported sitting Lower Body Bathing: Simulated;Minimal assistance Where Assessed - Lower Body Bathing: Supported sit to stand Upper Body Dressing: Simulated;Set up Where Assessed - Upper Body Dressing: Unsupported sitting Lower Body Dressing: Simulated;Moderate assistance Where Assessed - Lower Body Dressing: Sopported sit to stand Toilet Transfer: Performed;Min guard Statistician Method: Sit to Barista: Raised toilet seat with arms (or 3-in-1 over toilet) Toileting - Clothing Manipulation and Hygiene: Performed;Min guard Where Assessed - Toileting Clothing Manipulation and Hygiene: Sit to stand from 3-in-1 or toilet Transfers/Ambulation Related to ADLs: Pt ambulated to the bathroom with minguard A. Fatigues quickly.    OT Diagnosis: Generalized weakness  OT Problem List: Impaired balance (sitting and/or standing);Decreased activity tolerance;Decreased safety  awareness;Decreased knowledge of use of DME or AE OT Treatment Interventions: Self-care/ADL training;Therapeutic activities;DME and/or AE instruction;Patient/family education;Balance training   OT Goals Acute Rehab OT Goals OT Goal Formulation: With patient Time For Goal Achievement: 03/05/12 Potential to Achieve Goals: Good ADL Goals Pt Will Perform Grooming: with modified independence;Standing at sink;Unsupported ADL Goal: Grooming - Progress: Goal set today Pt Will Perform Lower Body Bathing: with modified independence;Sit to stand from chair;Sit to stand from bed;with adaptive equipment ADL Goal: Lower Body Bathing - Progress: Goal set today Pt Will Perform Lower Body Dressing: with modified independence;Sit to stand from bed;Sit to stand from chair;with adaptive equipment ADL Goal: Lower Body Dressing - Progress: Goal set today Pt Will Transfer to Toilet: with modified independence;3-in-1;Comfort height toilet;Ambulation ADL Goal: Toilet Transfer - Progress: Goal set today Pt Will Perform Toileting - Clothing Manipulation: with modified independence;Standing ADL Goal: Toileting - Clothing Manipulation - Progress: Goal set today Pt Will Perform Toileting - Hygiene: with modified independence;Sit to stand from 3-in-1/toilet ADL Goal: Toileting - Hygiene - Progress: Goal set today  Visit Information  Last OT Received On: 02/20/12 Assistance Needed: +1    Subjective Data  Subjective: Do I really have to get up again? Patient Stated Goal: Return home with daughter.   Prior Functioning  Home Living Lives With: Daughter Available Help at Discharge: Family;Available PRN/intermittently Type of Home: House Home Access: Stairs to enter [employer] of Steps: 3 Entrance Stairs-Rails: Right;Left Home Layout: Multi-level Alternate Level Stairs-Number of Steps: 3 Alternate Level Stairs-Rails: Right;Left Bathroom Shower/Tub: Health visitor: Standard Home  Adaptive Equipment: Shower chair without back;Walker - rolling;Walker - standard;Bedside commode/3-in-1 Prior Function Level of Independence: Independent Able to Take Stairs?: Yes Driving: Yes Vocation: Retired Musician: No difficulties Dominant Hand: Right    Cognition  Overall  Cognitive Status: Appears within functional limits for tasks assessed/performed Arousal/Alertness: Awake/alert Orientation Level: Appears intact for tasks assessed Behavior During Session: [facility] for tasks performed    Extremity/Trunk Assessment Right Upper Extremity Assessment RUE ROM/Strength/Tone: Within functional levels Left Upper Extremity Assessment LUE ROM/Strength/Tone: Within functional levels   Mobility Bed Mobility Bed Mobility: Sit to Supine Supine to Sit: With rails;HOB flat;5: Supervision Sitting - Scoot to Edge of Bed: 6: Modified independent (Device/Increase time) Sit to Supine: 5: Supervision;HOB flat;With rail Details for Bed Mobility Assistance: min VCs for technique Transfers Sit to Stand: 4: Min guard;With upper extremity assist;From bed;From chair/3-in-1 Stand to Sit: 4: Min guard;Without upper extremity assist;With armrests;To bed;To chair/3-in-1 Details for Transfer Assistance: min VCs for hand placement.   Exercise   Balance Balance Balance Assessed: Yes Static Sitting Balance Static Sitting - Balance Support: No upper extremity supported;Feet supported Static Sitting - Level of Assistance: 5: Stand by assistance Static Standing Balance Static Standing - Balance Support: No upper extremity supported;During functional activity Static Standing - Level of Assistance: 5: Stand by assistance  End of Session OT - End of Session Activity Tolerance: Patient tolerated treatment well Patient left: in bed;with call bell/phone within reach   Monte Zinni A OTR/L [phone] 02/20/2012, 1:19 PM

## 2012-02-20 NOTE — Progress Notes (Signed)
Physical Therapy Treatment Patient Details Name: [name] MRN: [id number] DOB: [birth year] Today's Date: 02/20/2012 Time: 0926-1009 PT Time Calculation (min): 43 min  PT Assessment / Plan / Recommendation Comments on Treatment Session  Pt s/p LLE embolectomy continuing to progress well with cueing for sequence and posture with gait. Pt required assist with pericare after BM and discussed with pt and dgtr D/C options . Again encouraged OOB for meals, HEP and ambulation    Follow Up Recommendations       Barriers to Discharge        Equipment Recommendations       Recommendations for Other Services    Frequency     Plan Discharge plan remains appropriate;Frequency remains appropriate    Precautions / Restrictions Precautions Precautions: Fall   Pertinent Vitals/Pain No pain    Mobility  Bed Mobility Bed Mobility: Supine to Sit Supine to Sit: HOB flat;With rails;6: Modified independent (Device/Increase time) Sitting - Scoot to Edge of Bed: 6: Modified independent (Device/Increase time) Transfers Transfers: Sit to Stand;Stand to Sit Sit to Stand: 5: Supervision;From bed;From chair/3-in-1 Stand to Sit: 6: Modified independent (Device/Increase time);To chair/3-in-1;With armrests Details for Transfer Assistance: cueing for hand placement Ambulation/Gait Ambulation/Gait Assistance: 5: Supervision Ambulation Distance (Feet): 90 Feet (10', 90') Assistive device: Rolling walker Ambulation/Gait Assistance Details: cueing for increased trunk extension and use of bil UE with gait, step to pattern leading with LLE  Gait Pattern: Step-to pattern;Trunk flexed Gait velocity: decreased    Exercises General Exercises - Lower Extremity Long Arc Quad: AROM;Both;15 reps;Seated Hip Flexion/Marching: AROM;Both;15 reps;Seated   PT Diagnosis:    PT Problem List:   PT Treatment Interventions:     PT Goals Acute Rehab PT Goals PT Goal: Supine/Side to Sit - Progress: Progressing toward  goal PT Goal: Sit to Stand - Progress: Progressing toward goal PT Transfer Goal: Bed to Chair/Chair to Bed - Progress: Progressing toward goal PT Goal: Ambulate - Progress: Progressing toward goal PT Goal: Up/Down Stairs - Progress: Progressing toward goal  Visit Information  Last PT Received On: 02/20/12 Assistance Needed: +1    Subjective Data  Subjective: I'm worn out   Cognition  Overall Cognitive Status: Appears within functional limits for tasks assessed/performed Arousal/Alertness: Awake/alert Orientation Level: Appears intact for tasks assessed Behavior During Session: [facility] for tasks performed    Balance     End of Session PT - End of Session Activity Tolerance: Patient tolerated treatment well Patient left: in chair;with call bell/phone within reach;with family/visitor present Nurse Communication: Mobility status    Delorse Lek 02/20/2012, 10:12 AM Delaney Meigs, PT [phone]

## 2012-02-25 ENCOUNTER — Other Ambulatory Visit: Payer: Self-pay | Admitting: *Deleted

## 2012-02-25 DIAGNOSIS — I743 Embolism and thrombosis of arteries of the lower extremities: Secondary | ICD-10-CM

## 2012-02-25 DIAGNOSIS — Z48812 Encounter for surgical aftercare following surgery on the circulatory system: Secondary | ICD-10-CM

## 2012-03-02 ENCOUNTER — Encounter: Payer: Self-pay | Admitting: Vascular Surgery

## 2012-03-03 ENCOUNTER — Ambulatory Visit (INDEPENDENT_AMBULATORY_CARE_PROVIDER_SITE_OTHER): Payer: Medicare Other | Admitting: Vascular Surgery

## 2012-03-03 ENCOUNTER — Encounter: Payer: Self-pay | Admitting: Vascular Surgery

## 2012-03-03 ENCOUNTER — Telehealth: Payer: Self-pay | Admitting: Internal Medicine

## 2012-03-03 VITALS — BP 117/50 | HR 79 | Temp 97.4°F | Resp 18 | Ht 62.0 in | Wt 179.0 lb

## 2012-03-03 DIAGNOSIS — I70219 Atherosclerosis of native arteries of extremities with intermittent claudication, unspecified extremity: Secondary | ICD-10-CM | POA: Insufficient documentation

## 2012-03-03 NOTE — Telephone Encounter (Signed)
Call placed to Theresa Duty with [facility] at [phone], no answer. A detailed voice message was left providing verbal order informing  per Dr  Rodena Medin ok to proceed with PT as stated. Voice message advised to call if any questions with direct phone number provided.

## 2012-03-03 NOTE — Progress Notes (Signed)
[facility] [place]  Patient name: [name] MRN: [id number] DOB: 03-21-1941 Sex: female  REASON FOR VISIT: follow up after left popliteal artery and tibial embolectomy  HPI: [name] is a 71 y.o. female he developed the sudden onset of paresthesias in her left foot on 02/15/2012. She has a history of atrial fibrillation but has not been on anticoagulation because of a remote history of an intracranial bleed. She was taken to the operating room and underwent left popliteal and tibial embolectomy with bovine pericardial patch angioplasty of the popliteal and posterior tibial artery. She comes in for her first outpatient visit. She is being anticoagulated with Xarelto. Only complaint is some mild left leg swelling.   REVIEW OF SYSTEMS: Arly.Keller ] denotes positive finding; [  ] denotes negative finding  CARDIOVASCULAR:  [ ]  chest pain   [ ]  dyspnea on exertion    CONSTITUTIONAL:  [ ]  fever   [ ]  chills  PHYSICAL EXAM: Filed Vitals:   03/03/12 0913  BP: 117/50  Pulse: 79  Temp: 97.4 F (36.3 C)  TempSrc: Oral  Resp: 18  Height: 5\' 2"  (1.575 m)  Weight: 179 lb (81.194 kg)   Body mass index is 32.74 kg/(m^2). GENERAL: The patient is a well-nourished female, in no acute distress. The vital signs are documented above. CARDIOVASCULAR: There is a regular rate and rhythm  PULMONARY: There is good air exchange bilaterally without wheezing or rales. She has brisk Doppler signals in her left foot. Her left leg incision is healing nicely.  MEDICAL ISSUES: Overall pleased with her progress and her foot appears well perfused. Incisions healing nicely. I'll see her back as needed.  Grisel Blumenstock S [facility] [place] Beeper: [phone]

## 2012-03-03 NOTE — Telephone Encounter (Signed)
Voice message received from Theresa Duty- physical therapist with [facility], requesting a extension on Home PT orders for 2 times per week for 5 weeks for ambulation, strengthening, balance, and home exercising.  His message states there has been an interruption of services due to hospital admissions.

## 2012-03-03 NOTE — Telephone Encounter (Signed)
ok 

## 2012-03-12 ENCOUNTER — Encounter: Payer: Self-pay | Admitting: Cardiology

## 2012-03-12 ENCOUNTER — Ambulatory Visit (INDEPENDENT_AMBULATORY_CARE_PROVIDER_SITE_OTHER): Payer: Medicare Other | Admitting: Cardiology

## 2012-03-12 VITALS — BP 131/74 | HR 80 | Ht 62.0 in | Wt 178.0 lb

## 2012-03-12 DIAGNOSIS — N179 Acute kidney failure, unspecified: Secondary | ICD-10-CM

## 2012-03-12 DIAGNOSIS — I4891 Unspecified atrial fibrillation: Secondary | ICD-10-CM

## 2012-03-12 DIAGNOSIS — I352 Nonrheumatic aortic (valve) stenosis with insufficiency: Secondary | ICD-10-CM

## 2012-03-12 DIAGNOSIS — I359 Nonrheumatic aortic valve disorder, unspecified: Secondary | ICD-10-CM

## 2012-03-12 MED ORDER — RIVAROXABAN 15 MG PO TABS: 15.0000 mg | ORAL_TABLET | Freq: Every day | ORAL | Status: DC

## 2012-03-12 MED ORDER — FERROUS SULFATE 325 (65 FE) MG PO TABS: 325.0000 mg | ORAL_TABLET | Freq: Every day | ORAL | Status: DC

## 2012-03-12 NOTE — Assessment & Plan Note (Signed)
The patient has a history of chronic atrial fibrillation.  She is now on Xarelto 15 mg daily.  She has had no further thromboembolic episodes.  She is not having any problems from her anticoagulation.  She is chronically anemic with a mildly decreased serum iron and we are starting iron therapy on her.  She has not been aware of any hematochezia or melena.

## 2012-03-12 NOTE — Progress Notes (Signed)
[name] Date of Birth:  1941/04/08 [facility] [address] [place], [place]  [address] [phone]         Fax   [phone]  History of Present Illness: This pleasant 71 year old woman is seen for a post hospital office visit.  She was recently hospitalized acutely for a left popliteal embolus which was secondary to her atrial fibrillation.  She required urgent vascular surgery by Dr. [name].  She had not previously been on anticoagulation despite her known atrial fibrillation because of a remote history of an intracerebral hemorrhage.  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  Patient has had a long history of known aortic insufficiency with compensated congestive heart failure.  Her echocardiogram done showing a recent hospitalization showed moderate to severe aortic insufficiency, increasing dilatation of her aortic root, and poor LV function with an ejection fraction of 20%.  Of note is the fact that on 10/17/99 she had a 2 day adenosine Cardiolite stress test which showed an ejection fraction of 51% and no ischemia.  She had a cardiac catheterization in 2002 which did not show any coronary artery disease.  She had a echocardiogram in 10/17/09 at which time her ejection fraction was 45-50%.  The recent echo done in May 2013 at [facility] therefore showed a significant decline in her left ventricular function.  With this in mind, the patient is inquiring about possible surgical correction of her descending aortic dilatation and her significant aortic valve regurgitation.  Also of note in her history is the fact that she has a defibrillator in place because of a prior history of cardiac arrest secondary to ventricular tachycardia.  Current Outpatient Prescriptions  Medication Sig Dispense Refill  . acetaminophen (TYLENOL) 325 MG tablet Take 2 tablets (650 mg total) by mouth  every 6 (six) hours as needed (or Fever >/= 101).  40 tablet  0  . allopurinol (ZYLOPRIM) 100 MG tablet Take 100 mg by mouth as directed. 2 tablets daily       . ALPRAZolam (XANAX) 0.5 MG tablet Take 1 tablet (0.5 mg total) by mouth 3 (three) times daily as needed for anxiety.  30 tablet  0  . BIOTIN PO Take 1 capsule by mouth daily.      . carvedilol (COREG) 3.125 MG tablet Take 1 tablet (3.125 mg total) by mouth 2 (two) times daily with a meal.  60 tablet  0  . furosemide (LASIX) 40 MG tablet Take 40 mg by mouth daily. Taking 2 tablets every 3 days      . losartan (COZAAR) 25 MG tablet Take 2 tablets (50 mg total) by mouth daily.  90 tablet  3  . nitroGLYCERIN (NITROSTAT) 0.4 MG SL tablet Place 1 tablet (0.4 mg total) under the tongue every 5 (five) minutes as needed.  25 tablet  prn  . polyethylene glycol (MIRALAX / GLYCOLAX) packet Take 17 g by mouth daily.      . potassium chloride SA (K-DUR,KLOR-CON) 20 MEQ tablet Take 1 tablet (20 mEq total) by mouth daily.  90 tablet  3  . DISCONTD: furosemide (LASIX) 40 MG tablet Take 1 tablet (40 mg total) by mouth daily.  30 tablet  0  . ferrous sulfate 325 (65 FE) MG tablet Take 1 tablet (325 mg total) by mouth daily with breakfast.  30 tablet  11  . Rivaroxaban (XARELTO) 15 MG TABS tablet  Take 1 tablet (15 mg total) by mouth daily.  30 tablet  5  . DISCONTD: allopurinol (ZYLOPRIM) 100 MG tablet Take 1 tablet (100 mg total) by mouth daily.  90 tablet  3  . DISCONTD: Rivaroxaban (XARELTO) 15 MG TABS tablet Take 1 tablet (15 mg total) by mouth 2 (two) times daily with a meal.  30 tablet  0  . DISCONTD: rivaroxaban 20 MG TABS Take 20 mg by mouth daily with supper.  30 tablet  0  . DISCONTD: Rivaroxaban 20 MG TABS Take 15 mg by mouth daily with supper.        Allergies  Allergen Reactions  . Latex Rash  . Neomycin-Bacitracin Zn-Polymyx Rash    Patient Active Problem List  Diagnosis  . HYPERLIPIDEMIA  . GOUT  . HYPERKALEMIA  . HYPERTENSION  .  Atrial fibrillation  . CONGESTIVE HEART FAILURE  . VENOUS INSUFFICIENCY  . BACK PAIN, LUMBAR  . MEMORY LOSS  . EDEMA  . FOOT PAIN, BILATERAL  . COMBINED HEART FAILURE, ACUTE ON CHRONIC  . SHORTNESS OF BREATH  . ALKALINE PHOSPHATASE, ELEVATED  . Aortic insufficiency and aortic stenosis  . Acute on chronic renal insufficiency  . Cardiac arrest  . Automatic implantable cardiac defibrillator single-chamber-[employer]  . Alopecia  . Lower limb ischemia  . Acute on chronic systolic congestive heart failure, NYHA class 3  . Acute on chronic kidney disease, stage 3  . Constipation  . Anemia  . Atherosclerosis of native arteries of the extremities with intermittent claudication    History  Smoking status  . Never Smoker   Smokeless tobacco  . Never Used    History  Alcohol Use No    Family History  Problem Relation Age of Onset  . Hypertension Father   . Cancer Father   . Coronary artery disease Father 51    Review of Systems: Constitutional: no fever chills diaphoresis or fatigue or change in weight.  Head and neck: no hearing loss, no epistaxis, no photophobia or visual disturbance. Respiratory: No cough, shortness of breath or wheezing. Cardiovascular: No chest pain peripheral edema, palpitations. Gastrointestinal: No abdominal distention, no abdominal pain, no change in bowel habits hematochezia or melena. Genitourinary: No dysuria, no frequency, no urgency, no nocturia. Musculoskeletal:No arthralgias, no back pain, no gait disturbance or myalgias. Neurological: No dizziness, no headaches, no numbness, no seizures, no syncope, no weakness, no tremors. Hematologic: No lymphadenopathy, no easy bruising. Psychiatric: No confusion, no hallucinations, no sleep disturbance.    Physical Exam: Filed Vitals:   03/12/12 1123  BP: 131/74  Pulse: 80   general appearance reveals a pleasant middle-aged woman in no acute distress.  She is slightly chronically pale.Pupils equal  and reactive.   Extraocular Movements are full.  There is no scleral icterus.  The mouth and pharynx are normal.  The neck is supple.  The carotids reveal no bruits.  The jugular venous pressure is normal.  The thyroid is not enlarged.  There is no lymphadenopathy.  The chest is clear to percussion and auscultation. There are no rales or rhonchi. Expansion of the chest is symmetrical.  The heart reveals a grade 3/6 decrescendo murmur of aortic insufficiency along the left sternal edge and over the sternum.  Rhythm is irregular.  No gallop.The abdomen is soft and nontender. Bowel sounds are normal. The liver and spleen are not enlarged. There Are no abdominal masses. There are no bruits.  Extremities reveal trace edema.  The left popliteal incision is healing  well.   Assessment / Plan: In terms of her atrial fibrillation we are committed to long-term anticoagulation now that she has had a significant arterial embolic event.  In terms of her valvular heart disease and her dilated aortic root she is interested in talking with a heart surgeon about possible repair.  We will arrange for her to have an office visit with Dr. [name] to see if she would be a possible candidate for surgery.  I am concerned about her ejection fraction having fallen to 20% from the previous level of 45-50% in January 2011.  If it looks like there is a possibility of surgery we could then proceed to arrange for cardiac catheterization etc. I will plan to see the patient back for a followup office visit in about 6 weeks.

## 2012-03-12 NOTE — Assessment & Plan Note (Signed)
The patient has had chronic exertional dyspnea.  She has not been aware of any significant worsening of her symptoms.  In fact she was out having a good time shopping on the afternoon that she had her sudden left leg pain from her arterial embolus.

## 2012-03-12 NOTE — Patient Instructions (Addendum)
Your physician recommends that you schedule a follow-up appointment in: 6 weeks  Start Xarelto 15 mg daily and Ferrous Sulfate 325 mg daily  Stop Aspirin 2 days after starting Xarelto   Tylenol only for fever/discomfort

## 2012-03-12 NOTE — Assessment & Plan Note (Signed)
The patient's GFR ranges between 45 and 50 and associates on the renal dose of Xarelto.  In view of her past history of intracerebral hemorrhage we would prefer that she be on the lower rather than the higher dosage.

## 2012-03-18 ENCOUNTER — Encounter: Payer: Self-pay | Admitting: Cardiothoracic Surgery

## 2012-03-18 ENCOUNTER — Institutional Professional Consult (permissible substitution) (INDEPENDENT_AMBULATORY_CARE_PROVIDER_SITE_OTHER): Payer: Medicare Other | Admitting: Cardiothoracic Surgery

## 2012-03-18 VITALS — BP 135/58 | HR 71 | Resp 16 | Ht 64.0 in | Wt 180.0 lb

## 2012-03-18 DIAGNOSIS — I352 Nonrheumatic aortic (valve) stenosis with insufficiency: Secondary | ICD-10-CM

## 2012-03-18 DIAGNOSIS — D649 Anemia, unspecified: Secondary | ICD-10-CM

## 2012-03-18 DIAGNOSIS — I712 Thoracic aortic aneurysm, without rupture: Secondary | ICD-10-CM

## 2012-03-18 DIAGNOSIS — I4891 Unspecified atrial fibrillation: Secondary | ICD-10-CM

## 2012-03-18 DIAGNOSIS — I359 Nonrheumatic aortic valve disorder, unspecified: Secondary | ICD-10-CM

## 2012-03-18 NOTE — Patient Instructions (Signed)
Aortic Insufficiency    Aortic insufficiency (AI) is a condition where the valve between the heart and the aorta (the big vessel that pumps blood to the entire body) does not close well enough. This means the heart has to work harder to pump the same amount of blood than it would if the valve closed tightly. Every time the heart beats, some of the blood leaks back into the heart. This would be like bailing out a boat with a leaky bucket. Over time, this condition leads to high blood pressure and eventually causes the heart to fail.  CAUSES    Anything that weakens the aortic valve can cause AI. Examples include:   Rheumatic fever.   Congenital (present at birth) valve abnormalities.   Aortic aneurysm (a ballooning of a weak spot in the vessel wall).   Syphilis.   Some collagen diseases and genetic problems.  AI also can be caused by infection or injury or can develop following the repair of aortic stenosis (a narrowing of the valve that does not let enough blood through).  SYMPTOMS   Weakness and fatigue.   Shortness of breath.   Needing to sleep on 2 or more pillows at night to breathe better.   Chest discomfort.   Head bobbing.  DIAGNOSIS    The diagnosis of AI can usually be made with a physical exam and an echocardiogram. An echocardiogram is a test that uses ultrasound to examine the heart. Other tests may also be done to confirm the diagnosis.  TREATMENT     If there are mild to no symptoms, only observation may be needed. With severe problems, hospitalization may be necessary.   Medications may be used to prevent an infection forming on the valves, to keep symptoms from getting worse or to delay surgery for 2 or 3 years. Some medications commonly used for AI help the heart work better.   Surgery to repair or replace the valve is usually reserved for last. Surgery results in better outcomes if not delayed too long.   Sudden onset of AI may require urgent surgery to replace the valve.   HOME CARE INSTRUCTIONS     Echocardiograms (a sound wave picture of the heart and great vessels) are done periodically to monitor treatment.   Notify the health care provider or dentist about any history of heart valve disease before treatment for any condition. Any dental work, including cleaning, and any invasive procedure can introduce bacteria into the bloodstream. Bacteria can infect a weakened valve causing endocarditis.   Take any medications as prescribed.  SEEK MEDICAL CARE IF:   Chest or breathing problems that get worse occur.   You notice irregular heartbeats.   You have unexplained fevers.  SEEK IMMEDIATE MEDICAL CARE IF:     You have new or severe shortness of breath.   You experience lightheadedness.   New or severe chest pains occur.   There are rapid or irregular heartbeats related to the above symptoms.  MAKE SURE YOU:     Understand these instructions.   Will watch your condition.   Will get help right away if you are not doing well or get worse.  Document Released: 03/22/2003 Document Revised: 09/04/2011 Document Reviewed: 08/04/2007  [employer] Patient Information 2012 [employer]

## 2012-03-18 NOTE — Progress Notes (Signed)
[address]            [place] [address]          [phone]      [name] [facility] Medical Record #[id number] Date of Birth: 22-Aug-1941  Referring: [name], MD Primary Care: [name], [name], MD  Chief Complaint:    Chief Complaint  Patient presents with  . Aortic Insuffiency    eval and treat  . Aortic Insuffiency  . Atrial Fibrillation  . Thoracic Aortic Aneurysm    History of Present Illness:   Patient is 71 year old female with long history of aortic insufficiency. She required urgent vascular surgery by Dr. [name] several weeks ago because of acute arterial embolus embolus to her left lower leg. She had not previously been on anticoagulation despite her known atrial fibrillation because of a remote history of an intracerebral hemorrhage. 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 Patient has had a long history of known aortic insufficiency with compensated congestive heart failure. Her echocardiogram done showing a recent hospitalization showed moderate to severe aortic insufficiency, increasing dilatation of her aortic root, and poor LV function with an ejection fraction of 20%. She recent echo done in May 2013 at [facility] therefore showed a significant decline in her left ventricular function. With this in mind, the patient is inquiring about possible surgical correction of her acending ( 4 cm by echo, not ct) aortic dilatation and her significant aortic valve regurgitation. Also of note in her history is the fact that she has a defibrillator in place because of a prior history of cardiac arrest secondary to ventricular tachycardia.  The patient's overall physical ability to get around is very limited her daughter does all the grocery shopping she is unable to walk even a quarter of mile. She does have nocturnal dyspnea one to 2  pillow orthopnea. She's had chronic edema in both lower strandy 3 years. She's at least class 3-4 heart failure.  Current Activity/ Functional Status: Patient is not independent with mobility/ambulation, transfers, ADL's, IADL's. Patient spent a 2 weeks and nurse skilled nursing facility to recover from the left leg embolus, she's had general decline in her ability to get around especially up and down stairs her daughter yesterday was looking for other living arrangements for her.   Past Medical History  Diagnosis Date  . History of ventricular tachycardia   . DOE (dyspnea on exertion)   . Peripheral edema   . Aortic insufficiency     Moderate 2012  . 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 History of gout   . Exogenous obesity   . History of CHF (congestive heart failure)     EF 45% 2012, nonischemic (Normal coronaries 2002)  . Renal insufficiency   . 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 Hypertension   . Stroke     Past Surgical History  Procedure Date  . Cardiac defibrillator placement   . Cataract extraction   . Embolectomy 02/15/2012    Procedure: EMBOLECTOMY;  Surgeon: [name], MD;  Location: [facility] OR;  Service: Vascular;  Laterality: Left;  left popliteal embolectomy with drain placement   patient has a vena caval filter in place put in in 2002, she has a  card documenting this but is not sure why it was placed  Family History  Problem Relation Age of Onset  . Hypertension Father   . Cancer Father   . Coronary artery disease Father 26    History   Social History  . Marital Status: Divorced    Spouse Name: N/A    Number of Children: N/A  . Years of Education: N/A   Occupational History  . Not on file.   Social History Main Topics  . Smoking status: Never Smoker   . Smokeless tobacco: Never Used  . Alcohol Use: No  . Drug Use: No  . Sexually Active: Not on file   Other Topics Concern    . Not on file   Social History Narrative   Lives with daughter.      History  Smoking status  . Never Smoker   Smokeless tobacco  . Never Used    History  Alcohol Use No     Allergies  Allergen Reactions  . Latex Rash  . Neomycin-Bacitracin Zn-Polymyx Rash    Current Outpatient Prescriptions  Medication Sig Dispense Refill  . acetaminophen (TYLENOL) 325 MG tablet Take 2 tablets (650 mg total) by mouth every 6 (six) hours as needed (or Fever >/= 101).  40 tablet  0  . allopurinol (ZYLOPRIM) 100 MG tablet Take 100 mg by mouth as directed. 2 tablets daily       . ALPRAZolam (XANAX) 0.5 MG tablet Take 1 tablet (0.5 mg total) by mouth 3 (three) times daily as needed for anxiety.  30 tablet  0  . BIOTIN PO Take 1 capsule by mouth daily.      . carvedilol (COREG) 3.125 MG tablet Take 1 tablet (3.125 mg total) by mouth 2 (two) times daily with a meal.  60 tablet  0  . ferrous sulfate 325 (65 FE) MG tablet Take 1 tablet (325 mg total) by mouth daily with breakfast.  30 tablet  11  . furosemide (LASIX) 40 MG tablet Take 40 mg by mouth daily. Taking 2 tablets every 3 days      . losartan (COZAAR) 25 MG tablet Take 25 mg by mouth 2 (two) times daily.      [name] oxyCODONE-acetaminophen (PERCOCET) 5-325 MG per tablet Take 1 tablet by mouth every 6 (six) hours as needed.      . polyethylene glycol (MIRALAX / GLYCOLAX) packet Take 17 g by mouth daily.      . potassium chloride SA (K-DUR,KLOR-CON) 20 MEQ tablet Take 1 tablet (20 mEq total) by mouth daily.  90 tablet  3  . Rivaroxaban (XARELTO) 15 MG TABS tablet Take 1 tablet (15 mg total) by mouth daily.  30 tablet  5  . DISCONTD: losartan (COZAAR) 25 MG tablet Take 2 tablets (50 mg total) by mouth daily.  90 tablet  3  . nitroGLYCERIN (NITROSTAT) 0.4 MG SL tablet Place 1 tablet (0.4 mg total) under the tongue every 5 (five) minutes as needed.  25 tablet  prn  . DISCONTD: allopurinol (ZYLOPRIM) 100 MG tablet Take 1 tablet (100 mg total) by mouth  daily.  90 tablet  3       Review of Systems:     Cardiac Review of Systems: Y or N  Chest Pain [  n  ]  Resting SOB [ n  ] Exertional SOB  Cove.Etienne  ]  Orthopnea Cove.Etienne  ]   Pedal Edema [ y  ]    Palpitations [  n ] Syncope  [n  ]   Presyncope [ y  ]  General Review of Systems: [Y] = yes [  ]=no Constitional: recent weight change [ n ]; anorexia [  n]; fatigue [ y ]; nausea [n  ]; night sweats [ y ]; fever [n  ]; or chills [ n ];                                                                                                                                          Dental: poor dentition[ n ]; Last Dentist visit:   Eye : blurred vision [  ]; diplopia [   ]; vision changes [  ];  Amaurosis fugax[  ]; Resp: cough [ n ];  wheezing[ n ];  hemoptysis[ n ]; shortness of breath[ y ]; paroxysmal nocturnal dyspnea[ y ]; dyspnea on exertion[ y ]; or orthopnea[ y ];  GI:  gallstones[  ], vomiting[  ];  dysphagia[  ]; melena[  ];  hematochezia [  ]; heartburn[  ];   Hx of  Colonoscopy[  ]; GU: kidney stones [  ]; hematuria[  ];   dysuria [n  ];  nocturia[  ];  history of     obstruction [n  ];             Skin: rash, swelling[  ];, hair loss[  ];  peripheral edema[  ];  or itching[  ]; Musculosketetal: myalgias[  ];  joint swelling[y  ];  joint erythema[  ];  joint pain[y  ];  back pain[y  ];  Heme/Lymph: bruising[severe  ];  bleeding[ n ];  anemia[ chronic ];  Neuro: TIA[  ];  headaches[  ];  stroke[  ];  vertigo[  ];  seizures[  ];   paresthesias[  ];  difficulty walking[  ];  Psych:depression[  ]; anxiety[  ];  Endocrine: diabetes[  ];  thyroid dysfunction[  ];  Immunizations: Flu [ ? ]; Pneumococcal[  ?];  Other:  Physical Exam: BP 135/58  Pulse 71  Resp 16  Ht 5\' 4"  (1.626 m)  Wt 180 lb (81.647 kg)  BMI 30.90 kg/m2  SpO2 98%  General appearance: alert, cooperative, appears older than stated age, fatigued and moderately obese Neurologic: intact Heart: prominent apical impulse and diastolic  murmur: holodiastolic 3/6, blowing throughout the precordium Lungs: clear to auscultation bilaterally and normal percussion bilaterally Abdomen: soft, non-tender; bowel sounds normal; no masses,  no organomegaly Extremities: edema Both lower extremities left greater than right, varicose veins noted and venous stasis dermatitis noted Wound: The incision in her left leg is intact and healing from her recent embolectomy She has no carotid bruits  Diagnostic Studies & Laboratory data:   Transthoracic Echocardiography  Patient: Neyla, Gauntt MR #: [id number] Study Date: 02/15/2012 Gender: F Age: 71 Height: 157.5cm Weight: 82.3kg BSA: 1.33m^2 Pt. Status: Room: 4704  PERFORMING [place], [facility] SONOGRAPHER [place], [facility] ADMITTING [place], Vijaya ATTENDING Kathlen Mody Ola Spurr, [place] cc:  ------------------------------------------------------------ LV EF: 20%  ------------------------------------------------------------ Indications: Aortic insufficiency 424.1. Atrial fibrillation - 427.31.  ------------------------------------------------------------ History: PMH: Evaluate for embolus. Dyspnea. Congestive heart failure. Risk factors: Hypertension. Dyslipidemia.  ------------------------------------------------------------ Study Conclusions  - Left ventricle: The cavity size was moderately dilated. Wall thickness was normal. Systolic function was severely reduced. The estimated ejection fraction was 20%. Severe diffuse hypokinesis with absent systolic thickening of the inferior myocardium. - Aortic valve: Moderate to severe regurgitation. - Ascending aorta: The ascending aorta was mildly to moderately dilated. Linear echo in the ascending aorta likely artifact, but dissection cannot be unequivocally excluded. - Mitral valve: Mild regurgitation. - Left atrium: The atrium was moderately to severely dilated. - Right atrium: The atrium was moderately  dilated. - Atrial septum: No defect or patent foramen ovale was identified. - Pulmonary arteries: PA peak pressure: 52mm Hg (S). Impressions:  - Compared to the previous study performed 12/23/10, LV and aortic root size have increased; further impairment of LV systolic function, worsening AI. Transthoracic echocardiography. M-mode, complete 2D, spectral Doppler, and color Doppler. Height: Height: 157.5cm. Height: 62in. Weight: Weight: 82.3kg. Weight: 181.1lb. Body mass index: BMI: 33.2kg/m^2. Body surface area: BSA: 1.32m^2. Blood pressure: 154/49. Patient status: Inpatient. Location: Bedside.  ------------------------------------------------------------  ------------------------------------------------------------ Left ventricle: The cavity size was moderately dilated. Wall thickness was normal. Systolic function was severely reduced. The estimated ejection fraction was 20%. Severe diffuse hypokinesis with absent systolic thickening of the inferior myocardium.  ------------------------------------------------------------ Aortic valve: Trileaflet; mildly thickened leaflets. Doppler: Transvalvular velocity was increased. There was no stenosis. Moderate to severe regurgitation. Peak velocity ratio of LVOT to aortic valve: 0.78. Mean gradient: 11mm Hg (S). Peak gradient: 22mm Hg (S).  ------------------------------------------------------------ Aorta: Aortic root: The aortic root was mildly dilated. Ascending aorta: The ascending aorta was mildly to moderately dilated. Linear echo in the ascending aorta likely artifact, but dissection cannot be unequivocally excluded. Descending aorta: The descending aorta was normal in size. No atheroma apparent.  ------------------------------------------------------------ Mitral valve: Mildly thickened leaflets . Leaflet separation was normal. Doppler: Transvalvular velocity was within the normal range. There was no evidence for  stenosis. Mild regurgitation. Mean gradient: 5mm Hg (D). Peak gradient: 12mm Hg (D).  ------------------------------------------------------------ Left atrium: The atrium was moderately to severely dilated.  ------------------------------------------------------------ Atrial septum: No defect or patent foramen ovale was identified.  ------------------------------------------------------------ Right ventricle: The cavity size was normal. Wall thickness was normal. Pacer wire or catheter noted in right ventricle. Systolic function was normal.  ------------------------------------------------------------ Pulmonic valve: Structurally normal valve. Cusp separation was normal. Doppler: Transvalvular velocity was within the normal range. No regurgitation.  ------------------------------------------------------------ Tricuspid valve: Structurally normal valve. Leaflet separation was normal. Doppler: Transvalvular velocity was within the normal range. Trivial regurgitation.  ------------------------------------------------------------ Pulmonary artery: The main pulmonary artery was normal-sized. Systolic pressure could not be accurately estimated.  ------------------------------------------------------------ Right atrium: The atrium was moderately dilated.  ------------------------------------------------------------ Pericardium: There was no pericardial effusion.  ------------------------------------------------------------ Systemic veins: Inferior vena cava: The vessel was mildly dilated; the respirophasic diameter changes were in the normal range (= 50%).  ------------------------------------------------------------  2D measurements Normal Doppler measurements Normal Left ventricle Main pulmonary LVID ED, 68.9 mm 43-52 artery chord, Pressure, S 52 mm =30 PLAX Hg LVID ES, 57.9 mm 23-38 LVOT chord, Peak vel, S 184 cm/s ------ PLAX Peak 14 mm ------ FS, chord, 16 % >29  gradient, S Hg PLAX Aortic valve LVPW, ED 10 mm ------ Peak  vel, S 236 cm/s ------ IVS/LVPW 0.91 <1.3 Mean vel, S 153 cm/s ------ ratio, ED VTI, S 41.7 cm ------ Ventricular septum Mean 11 mm ------ IVS, ED 9.05 mm ------ gradient, S Hg Aortic valve Peak 22 mm ------ Leaflet 16 mm 15-26 gradient, S Hg separation Peak vel 0.78 ------ Aorta ratio, Root diam, 40 mm ------ LVOT/AV ED Regurg vel, 308 cm/s ------ Left atrium ED AP dim 36 mm ------ Regurg PHT 290 ms ------ AP dim 1.86 cm/m^2 <2.2 Regurg 38 mm ------ index gradient, ED Hg Mitral valve M-mode measurements Normal Peak E vel 153 cm/s ------ Aortic valve Mean vel, D 99.7 cm/s ------ Leaflet 16 mm 15-26 Mean 5 mm ------ separation gradient, D Hg Peak 12 mm ------ gradient, D Hg Annulus VTI 29 cm ------ Tricuspid valve Regurg peak 303 cm/s ------ vel Peak RV-RA 37 mm ------ gradient, S Hg Systemic veins Estimated 15 mm ------ CVP Hg Right ventricle Pressure, S 52 mm <30 Hg Sa vel, lat 10.4 cm/s ------ ann, tiss DP  ------------------------------------------------------------ Prepared and Electronically Authenticated by  [place] Bing, MD 2013-05-19T13:51:19.390   Recent Radiology Findings:   She has no CT findings to evaluate the size of her aorta  Recent Lab Findings: Lab Results  Component Value Date   WBC 7.0 02/20/2012   HGB 8.9* 02/20/2012   HCT 27.4* 02/20/2012   PLT 226 02/20/2012   GLUCOSE 96 02/20/2012   CHOL 193 02/13/2012   TRIG 92 02/13/2012   HDL 59 02/13/2012   LDLDIRECT 152.6 09/01/2011   LDLCALC 116* 02/13/2012   ALT 11 02/15/2012   AST 19 02/15/2012   NA 140 02/20/2012   K 3.8 02/20/2012   CL 102 02/20/2012   CREATININE 1.02 02/20/2012   BUN 21 02/20/2012   CO2 28 02/20/2012   TSH 2.263 02/15/2012   INR 1.06 02/15/2012      Assessment / Plan:    #1 moderate to severe aortic insufficiency with 20% ejection fraction left ventricular end-diastolic dimension of 69 mm 4 cm descending aorta by  echo #2 inferior severe diffuse hypokinesis with absent systolic thickening of the inferior myocardium, coronary artery status is unknown #3 atrial fibrillation with recent left leg arterial embolus #4 generalized deconditioning #5 history of stroke in the past affecting the right side #6 limited renal reserve, stage III renal insufficiency  Patient with severe aortic insufficiency which is now with a end-diastolic dimension of 69 mm up from 61 approximately one year ago and a 20% ejection fraction with class IV congestive heart failure makes the patient in a very high risk category for aortic valve replacement, the degree of LV function return maybe limited. These findings do not preclude aortic valve replacement but does limit what benefit she may gain from it. I discussed the risks and options of surgery with the patient and her daughter. If we proceeded with consideration of surgery she will need a cardiac catheterization, and CT of the chest to evaluate the size of her descending aorta.  She would like to think over her surgical options options, is currently looking for living options because of her increasing limited ability to get around. I've made her appointment to see me back in 2 weeks at which time she can bring the daughter who she lives with also to the appointment.      Delight Ovens MD  Beeper [phone] Office [phone] 03/18/2012 1:00 PM

## 2012-03-23 ENCOUNTER — Encounter: Payer: Self-pay | Admitting: Vascular Surgery

## 2012-03-24 ENCOUNTER — Ambulatory Visit: Payer: Medicare Other | Admitting: Vascular Surgery

## 2012-03-29 HISTORY — PX: CARDIAC CATHETERIZATION: SHX172

## 2012-03-30 ENCOUNTER — Ambulatory Visit (INDEPENDENT_AMBULATORY_CARE_PROVIDER_SITE_OTHER): Payer: Medicare Other | Admitting: Cardiology

## 2012-03-30 ENCOUNTER — Encounter: Payer: Self-pay | Admitting: Cardiology

## 2012-03-30 ENCOUNTER — Telehealth: Payer: Self-pay | Admitting: *Deleted

## 2012-03-30 VITALS — BP 147/74 | HR 95 | Wt 180.0 lb

## 2012-03-30 DIAGNOSIS — D649 Anemia, unspecified: Secondary | ICD-10-CM

## 2012-03-30 DIAGNOSIS — I359 Nonrheumatic aortic valve disorder, unspecified: Secondary | ICD-10-CM

## 2012-03-30 DIAGNOSIS — Z8739 Personal history of other diseases of the musculoskeletal system and connective tissue: Secondary | ICD-10-CM

## 2012-03-30 DIAGNOSIS — I351 Nonrheumatic aortic (valve) insufficiency: Secondary | ICD-10-CM

## 2012-03-30 DIAGNOSIS — Z862 Personal history of diseases of the blood and blood-forming organs and certain disorders involving the immune mechanism: Secondary | ICD-10-CM

## 2012-03-30 DIAGNOSIS — I10 Essential (primary) hypertension: Secondary | ICD-10-CM

## 2012-03-30 DIAGNOSIS — I119 Hypertensive heart disease without heart failure: Secondary | ICD-10-CM

## 2012-03-30 DIAGNOSIS — I352 Nonrheumatic aortic (valve) stenosis with insufficiency: Secondary | ICD-10-CM

## 2012-03-30 LAB — BASIC METABOLIC PANEL
Calcium: 8.9 mg/dL (ref 8.4–10.5)
GFR: 45.86 mL/min — ABNORMAL LOW (ref >60.00)
Sodium: 140 mEq/L (ref 135–145)

## 2012-03-30 LAB — CBC WITH DIFFERENTIAL/PLATELET
Basophils Absolute: 0 10*3/uL (ref 0.0–0.1)
Basophils Relative: 0.5 % (ref 0.0–3.0)
Eosinophils Absolute: 0.5 10*3/uL (ref 0.0–0.7)
Lymphocytes Relative: 21 % (ref 12.0–46.0)
MCHC: 31.9 g/dL (ref 30.0–36.0)
Neutrophils Relative %: 58.4 % (ref 43.0–77.0)
RBC: 3.43 Mil/uL — ABNORMAL LOW (ref 3.87–5.11)
RDW: 18.6 % — ABNORMAL HIGH (ref 11.5–14.6)

## 2012-03-30 LAB — LIPID PANEL
HDL: 57.3 mg/dL (ref >39.00)
Triglycerides: 82 mg/dL (ref 0.0–149.0)

## 2012-03-30 LAB — HEPATIC FUNCTION PANEL
ALT: 12 U/L (ref 0–35)
AST: 18 U/L (ref 0–37)
Albumin: 3.7 g/dL (ref 3.5–5.2)
Total Protein: 7.4 g/dL (ref 6.0–8.3)

## 2012-03-30 NOTE — Telephone Encounter (Signed)
Message copied by Burnell Blanks on Tue Mar 30, 2012  2:42 PM ------      Message from: [name]      Created: Tue Mar 30, 2012  2:16 PM       Please report to patient.  The recent labs are stable. Continue same medication and careful diet. Hgb is slightly better so she can decrease FeSO4 to QOD since the side effects bother her so much.

## 2012-03-30 NOTE — Progress Notes (Addendum)
[name] Date of Birth:  [birth year] [facility] [address] [place], [place]  [address] [phone]         Fax   [phone]  History of Present Illness: This pleasant 71 year old woman has a history of chronic atrial fibrillation and was recently hospitalized acutely for a left popliteal embolus requiring urgent vascular surgery by Dr. [name].  At the time of her event she had not been on anticoagulation because of a remote history of intracerebral hemorrhage.  She is now on long-term Xarelto 15 mg daily.  She has had no further TIA symptoms.  No further embolic events.  The patient does have severe aortic insufficiency and her recent hospital echocardiogram showed an ejection fraction of only 20%.  Previous ejection fractions by echo in January 2011 had shown an ejection fraction of 45-50%.  Current Outpatient Prescriptions  Medication Sig Dispense Refill  . allopurinol (ZYLOPRIM) 100 MG tablet Take 100 mg by mouth as directed. 2 tablets daily       . BIOTIN PO Take 1 capsule by mouth daily.      . carvedilol (COREG) 3.125 MG tablet Take 1 tablet (3.125 mg total) by mouth 2 (two) times daily with a meal.  60 tablet  0  . ferrous sulfate 325 (65 FE) MG tablet Take 1 tablet (325 mg total) by mouth daily with breakfast.  30 tablet  11  . furosemide (LASIX) 40 MG tablet Take 40 mg by mouth daily. Taking 2 tablets every 3 days      . losartan (COZAAR) 25 MG tablet Take 25 mg by mouth 2 (two) times daily.      . nitroGLYCERIN (NITROSTAT) 0.4 MG SL tablet Place 1 tablet (0.4 mg total) under the tongue every 5 (five) minutes as needed.  25 tablet  prn  . potassium chloride SA (K-DUR,KLOR-CON) 20 MEQ tablet Take 1 tablet (20 mEq total) by mouth daily.  90 tablet  3  . Rivaroxaban (XARELTO) 20 MG TABS Take 20 mg by mouth daily.      . Rivaroxaban (XARELTO) 15 MG TABS tablet Take 1 tablet (15 mg total) by mouth daily.  30 tablet  5  . DISCONTD: allopurinol  (ZYLOPRIM) 100 MG tablet Take 1 tablet (100 mg total) by mouth daily.  90 tablet  3    Allergies  Allergen Reactions  . Latex Rash  . Neomycin-Bacitracin Zn-Polymyx Rash    Patient Active Problem List  Diagnosis  . HYPERLIPIDEMIA  . GOUT  . HYPERKALEMIA  . HYPERTENSION  . Atrial fibrillation  . CONGESTIVE HEART FAILURE  . VENOUS INSUFFICIENCY  . BACK PAIN, LUMBAR  . MEMORY LOSS  . EDEMA  . FOOT PAIN, BILATERAL  . COMBINED HEART FAILURE, ACUTE ON CHRONIC  . SHORTNESS OF BREATH  . ALKALINE PHOSPHATASE, ELEVATED  . Aortic insufficiency and aortic stenosis  . Acute on chronic renal insufficiency  . Cardiac arrest  . Automatic implantable cardiac defibrillator single-chamber-Medtronic  . Alopecia  . Lower limb ischemia  . Acute on chronic systolic congestive heart failure, NYHA class 3  . Acute on chronic kidney disease, stage 3  . Constipation  . Anemia  . Atherosclerosis of native arteries of the extremities with intermittent claudication    History  Smoking status  . Never Smoker   Smokeless tobacco  . Never Used    History  Alcohol Use No    Family History  Problem Relation Age of Onset  . Hypertension Father   .  Cancer Father   . Coronary artery disease Father 12    Review of Systems: Constitutional: no fever chills diaphoresis or fatigue or change in weight.  Head and neck: no hearing loss, no epistaxis, no photophobia or visual disturbance. Respiratory: No cough, shortness of breath or wheezing. Cardiovascular: No chest pain peripheral edema, palpitations. Gastrointestinal: No abdominal distention, no abdominal pain, no change in bowel habits hematochezia or melena. Genitourinary: No dysuria, no frequency, no urgency, no nocturia. Musculoskeletal:No arthralgias, no back pain, no gait disturbance or myalgias. Neurological: No dizziness, no headaches, no numbness, no seizures, no syncope, no weakness, no tremors. Hematologic: No lymphadenopathy, no  easy bruising. Psychiatric: No confusion, no hallucinations, no sleep disturbance.    Physical Exam: Filed Vitals:   03/30/12 0947  BP: 147/74  Pulse: 95   the general appearance reveals a elderly woman in no acute distress.Pupils equal and reactive.   Extraocular Movements are full.  There is no scleral icterus.  The mouth and pharynx are normal.  The neck is supple.  The carotids reveal no bruits.  The jugular venous pressure is normal.  The thyroid is not enlarged.  There is no lymphadenopathy.  Chest reveals a few basilar rales bilaterally.  The heart reveals a regular rhythm and a grade 3/6 decrescendo murmur of aortic insufficiency.The abdomen is soft and nontender. Bowel sounds are normal. The liver and spleen are not enlarged. There Are no abdominal masses. There are no bruits.  Extremities reveal trace edema.  She does have easy bruising.   Assessment / Plan: I have encouraged her to proceed with plans for possible aortic valve replacement.  With her ejection fraction dropping to 20% her prognosis is poor without surgery and without correction of her severe aortic insufficiency.  He has an appointment to see Dr. [name] again tomorrow.  If she is agreeable to proceeding then we could arrange for cardiac catheterization with Dr. [place]. Continue same medication except stop baby aspirin because of excessive bruising.  We are checking a CBC today.  Recheck for followup visit here in 2 months. She is having a difficult time resting at night because of dyspnea and difficulty changing position in bed and the need to sleep with her head elevated.  I gave her a written prescription for an electric hospital bed.

## 2012-03-30 NOTE — Patient Instructions (Signed)
STOP ASPIRIN  Will obtain labs today and call you with the results  Your physician recommends that you schedule a follow-up appointment in: 2 months

## 2012-03-30 NOTE — Telephone Encounter (Signed)
Advised patient of lab results and medication change  

## 2012-03-30 NOTE — Progress Notes (Signed)
Quick Note:  Please report to patient. The recent labs are stable. Continue same medication and careful diet. Hgb is slightly better so she can decrease FeSO4 to QOD since the side effects bother her so much. ______

## 2012-03-30 NOTE — Assessment & Plan Note (Signed)
The patient has a past history of severe aortic insufficiency.  She has been experiencing symptoms of worsening congestive heart failure.  She has 2 pillow orthopnea and is having paroxysmal nocturnal dyspnea.  She has noted increasing exertional dyspnea walking on level ground from the parking lot into a restaurant.  She is being seen by Dr. [name] in regard to possible aortic valve replacement.

## 2012-03-30 NOTE — Assessment & Plan Note (Signed)
Blood pressure has been remaining in the satisfactory range since last visit.  No headaches.  No dizzy spells or syncope.

## 2012-03-31 ENCOUNTER — Ambulatory Visit (INDEPENDENT_AMBULATORY_CARE_PROVIDER_SITE_OTHER): Payer: Medicare Other | Admitting: Cardiothoracic Surgery

## 2012-03-31 ENCOUNTER — Encounter: Payer: Self-pay | Admitting: Cardiothoracic Surgery

## 2012-03-31 VITALS — BP 160/69 | HR 85 | Resp 18 | Ht 62.0 in | Wt 171.0 lb

## 2012-03-31 DIAGNOSIS — I712 Thoracic aortic aneurysm, without rupture: Secondary | ICD-10-CM

## 2012-03-31 DIAGNOSIS — I359 Nonrheumatic aortic valve disorder, unspecified: Secondary | ICD-10-CM

## 2012-03-31 DIAGNOSIS — I4891 Unspecified atrial fibrillation: Secondary | ICD-10-CM

## 2012-03-31 DIAGNOSIS — I351 Nonrheumatic aortic (valve) insufficiency: Secondary | ICD-10-CM

## 2012-03-31 NOTE — Progress Notes (Signed)
[address]            [place] [address]          [phone]        [name] [facility] Medical Record #[id number] Date of Birth: Jan 04, 1941  Referring: [name], MD Primary Care: [name], [name], MD  Chief Complaint:    Chief Complaint  Patient presents with  . Aortic Insuffiency    Further discuss surgery with daughter present  . Atrial Fibrillation  . Thoracic Aortic Aneurysm    History of Present Illness:   Patient is 71 year old female with long history of aortic insufficiency. She required urgent vascular surgery by Dr. [name] several weeks ago because of acute arterial embolus embolus to her left lower leg. She had not previously been on anticoagulation despite her known atrial fibrillation because of a remote history of an intracerebral hemorrhage. 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 Patient has had a long history of known aortic insufficiency with compensated congestive heart failure. Her echocardiogram done showing a recent hospitalization showed moderate to severe aortic insufficiency, increasing dilatation of her aortic root, and poor LV function with an ejection fraction of 20%. She recent echo done in May 2013 at [facility] therefore showed a significant decline in her left ventricular function. With this in mind, the patient is inquiring about possible surgical correction of her acending ( 4 cm by echo, not ct) aortic dilatation and her significant aortic valve regurgitation. Also of note in her history is the fact that she has a defibrillator in place because of a prior history of cardiac arrest secondary to ventricular tachycardia.  The patient's overall physical ability to get around is very limited her daughter does all the grocery shopping she is unable to walk even a quarter of mile. She does have  nocturnal dyspnea one to 2 pillow orthopnea. She's had chronic edema in both lower strandy 3 years. She's at least class 3-4 heart failure.  The patient returns today with her daughter to further discuss her options. Since seen 2 weeks ago she appears to be slightly more short of breath than she was even 2 weeks ago.  Current Activity/ Functional Status: Patient is not independent with mobility/ambulation, transfers, ADL's, IADL's. Patient spent a 2 weeks and nurse skilled nursing facility to recover from the left leg embolus, she's had general decline in her ability to get around especially up and down stairs her daughter yesterday was looking for other living arrangements for her.   Past Medical History  Diagnosis Date  . History of ventricular tachycardia   . DOE (dyspnea on exertion)   . Peripheral edema   . Aortic insufficiency     Moderate 2012  . 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 History of gout   . Exogenous obesity   . History of CHF (congestive heart failure)     EF 45% 2012, nonischemic (Normal coronaries 2002)  . Renal insufficiency   . 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 Hypertension   . Stroke     Past Surgical History  Procedure Date  . Cardiac defibrillator placement   . Cataract  extraction   . Embolectomy 02/15/2012    Procedure: EMBOLECTOMY;  Surgeon: [name], MD;  Location: [facility] OR;  Service: Vascular;  Laterality: Left;  left popliteal embolectomy with drain placement   patient has a vena caval filter in place put in in 2002, she has a card documenting this but is not sure why it was placed  Family History  Problem Relation Age of Onset  . Hypertension Father   . Cancer Father   . Coronary artery disease Father 69    History   Social History  . Marital Status: Divorced    Spouse Name: N/A    Number of Children: N/A  . Years of Education: N/A   Occupational History  .  Not on file.   Social History Main Topics  . Smoking status: Never Smoker   . Smokeless tobacco: Never Used  . Alcohol Use: No  . Drug Use: No  . Sexually Active: Not on file   Other Topics Concern  . Not on file   Social History Narrative   Lives with daughter.      History  Smoking status  . Never Smoker   Smokeless tobacco  . Never Used    History  Alcohol Use No     Allergies  Allergen Reactions  . Latex Rash  . Neomycin-Bacitracin Zn-Polymyx Rash    Current Outpatient Prescriptions  Medication Sig Dispense Refill  . allopurinol (ZYLOPRIM) 100 MG tablet Take 100 mg by mouth as directed. 2 tablets daily       . BIOTIN PO Take 1 capsule by mouth daily.      . carvedilol (COREG) 3.125 MG tablet Take 1 tablet (3.125 mg total) by mouth 2 (two) times daily with a meal.  60 tablet  0  . ferrous sulfate 325 (65 FE) MG tablet Take 325 mg by mouth as directed. Every other day      . furosemide (LASIX) 40 MG tablet Take 40 mg by mouth daily. Taking 2 tablets every 3 days      . losartan (COZAAR) 25 MG tablet Take 25 mg by mouth 2 (two) times daily.      . nitroGLYCERIN (NITROSTAT) 0.4 MG SL tablet Place 1 tablet (0.4 mg total) under the tongue every 5 (five) minutes as needed.  25 tablet  prn  . potassium chloride SA (K-DUR,KLOR-CON) 20 MEQ tablet Take 1 tablet (20 mEq total) by mouth daily.  90 tablet  3  . Rivaroxaban (XARELTO) 20 MG TABS Take 20 mg by mouth daily.      . Rivaroxaban (XARELTO) 15 MG TABS tablet Take 1 tablet (15 mg total) by mouth daily.  30 tablet  5  . DISCONTD: allopurinol (ZYLOPRIM) 100 MG tablet Take 1 tablet (100 mg total) by mouth daily.  90 tablet  3       Review of Systems:     Cardiac Review of Systems: Y or N  Chest Pain [  n  ]  Resting SOB [ n  ] Exertional SOB  Cove.Etienne  ]  Orthopnea Cove.Etienne  ]   Pedal Edema [ y  ]    Palpitations [ n ] Syncope  [n  ]   Presyncope [ y  ]  General Review of Systems: [Y] = yes [  ]=no Constitional: recent weight  change [ n ]; anorexia [  n]; fatigue [ y ]; nausea [n  ]; night sweats [ y ]; fever [n  ]; or chills [  n ];                                                                                                                                          Dental: poor dentition[ n ]; Last Dentist visit:   Eye : blurred vision [  ]; diplopia [   ]; vision changes [  ];  Amaurosis fugax[  ]; Resp: cough [ n ];  wheezing[ n ];  hemoptysis[ n ]; shortness of breath[ y ]; paroxysmal nocturnal dyspnea[ y ]; dyspnea on exertion[ y ]; or orthopnea[ y ];  GI:  gallstones[  ], vomiting[  ];  dysphagia[  ]; melena[  ];  hematochezia [  ]; heartburn[  ];   Hx of  Colonoscopy[  ]; GU: kidney stones [  ]; hematuria[  ];   dysuria [n  ];  nocturia[  ];  history of     obstruction [n  ];             Skin: rash, swelling[  ];, hair loss[  ];  peripheral edema[  ];  or itching[  ]; Musculosketetal: myalgias[  ];  joint swelling[y  ];  joint erythema[  ];  joint pain[y  ];  back pain[y  ];  Heme/Lymph: bruising[severe  ];  bleeding[ n ];  anemia[ chronic ];  Neuro: TIA[  ];  headaches[  ];  stroke[  ];  vertigo[  ];  seizures[  ];   paresthesias[  ];  difficulty walking[  ];  Psych:depression[  ]; anxiety[  ];  Endocrine: diabetes[  ];  thyroid dysfunction[  ];  Immunizations: Flu [ ? ]; Pneumococcal[  ?];  Other:  Physical Exam: BP 160/69  Pulse 85  Resp 18  Ht 5\' 2"  (1.575 m)  Wt 171 lb (77.565 kg)  BMI 31.28 kg/m2  SpO2 95%  General appearance: alert, cooperative, appears older than stated age, fatigued and moderately obese Neurologic: intact Heart: prominent apical impulse and diastolic murmur: holodiastolic 3/6, blowing throughout the precordium Lungs: clear to auscultation bilaterally and normal percussion bilaterally Abdomen: soft, non-tender; bowel sounds normal; no masses,  no organomegaly Extremities: edema Both lower extremities left greater than right, varicose veins noted and venous stasis dermatitis  noted Wound: The incision in her left leg is intact and healing from her recent embolectomy She has no carotid bruits  Diagnostic Studies & Laboratory data:   Transthoracic Echocardiography  Patient: [name], [name] MR #: [id number] Study Date: 02/15/2012 Gender: F Age: 89 Height: 157.5cm Weight: 82.3kg BSA: 1.44m^2 Pt. Status: Room: 4704  PERFORMING , [facility] SONOGRAPHER Silvano Bilis, RCS ADMITTING Rose Bud, Vijaya ATTENDING Kathlen Mody Ola Spurr, [place] cc:  ------------------------------------------------------------ LV EF: 20%  ------------------------------------------------------------ Indications: Aortic insufficiency 424.1. Atrial fibrillation - 427.31.  ------------------------------------------------------------ History: PMH: Evaluate for embolus. Dyspnea. Congestive heart failure. Risk factors: Hypertension. Dyslipidemia.  ------------------------------------------------------------ Study Conclusions  - Left ventricle:  The cavity size was moderately dilated. Wall thickness was normal. Systolic function was severely reduced. The estimated ejection fraction was 20%. Severe diffuse hypokinesis with absent systolic thickening of the inferior myocardium. - Aortic valve: Moderate to severe regurgitation. - Ascending aorta: The ascending aorta was mildly to moderately dilated. Linear echo in the ascending aorta likely artifact, but dissection cannot be unequivocally excluded. - Mitral valve: Mild regurgitation. - Left atrium: The atrium was moderately to severely dilated. - Right atrium: The atrium was moderately dilated. - Atrial septum: No defect or patent foramen ovale was identified. - Pulmonary arteries: PA peak pressure: 52mm Hg (S). Impressions:  - Compared to the previous study performed 12/23/10, LV and aortic root size have increased; further impairment of LV systolic function, worsening AI. Transthoracic echocardiography.  M-mode, complete 2D, spectral Doppler, and color Doppler. Height: Height: 157.5cm. Height: 62in. Weight: Weight: 82.3kg. Weight: 181.1lb. Body mass index: BMI: 33.2kg/m^2. Body surface area: BSA: 1.73m^2. Blood pressure: 154/49. Patient status: Inpatient. Location: Bedside.  ------------------------------------------------------------  ------------------------------------------------------------ Left ventricle: The cavity size was moderately dilated. Wall thickness was normal. Systolic function was severely reduced. The estimated ejection fraction was 20%. Severe diffuse hypokinesis with absent systolic thickening of the inferior myocardium.  ------------------------------------------------------------ Aortic valve: Trileaflet; mildly thickened leaflets. Doppler: Transvalvular velocity was increased. There was no stenosis. Moderate to severe regurgitation. Peak velocity ratio of LVOT to aortic valve: 0.78. Mean gradient: 11mm Hg (S). Peak gradient: 22mm Hg (S).  ------------------------------------------------------------ Aorta: Aortic root: The aortic root was mildly dilated. Ascending aorta: The ascending aorta was mildly to moderately dilated. Linear echo in the ascending aorta likely artifact, but dissection cannot be unequivocally excluded. Descending aorta: The descending aorta was normal in size. No atheroma apparent.  ------------------------------------------------------------ Mitral valve: Mildly thickened leaflets . Leaflet separation was normal. Doppler: Transvalvular velocity was within the normal range. There was no evidence for stenosis. Mild regurgitation. Mean gradient: 5mm Hg (D). Peak gradient: 12mm Hg (D).  ------------------------------------------------------------ Left atrium: The atrium was moderately to severely dilated.  ------------------------------------------------------------ Atrial septum: No defect or patent foramen ovale  was identified.  ------------------------------------------------------------ Right ventricle: The cavity size was normal. Wall thickness was normal. Pacer wire or catheter noted in right ventricle. Systolic function was normal.  ------------------------------------------------------------ Pulmonic valve: Structurally normal valve. Cusp separation was normal. Doppler: Transvalvular velocity was within the normal range. No regurgitation.  ------------------------------------------------------------ Tricuspid valve: Structurally normal valve. Leaflet separation was normal. Doppler: Transvalvular velocity was within the normal range. Trivial regurgitation.  ------------------------------------------------------------ Pulmonary artery: The main pulmonary artery was normal-sized. Systolic pressure could not be accurately estimated.  ------------------------------------------------------------ Right atrium: The atrium was moderately dilated.  ------------------------------------------------------------ Pericardium: There was no pericardial effusion.  ------------------------------------------------------------ Systemic veins: Inferior vena cava: The vessel was mildly dilated; the respirophasic diameter changes were in the normal range (= 50%).  ------------------------------------------------------------  2D measurements Normal Doppler measurements Normal Left ventricle Main pulmonary LVID ED, 68.9 mm 43-52 artery chord, Pressure, S 52 mm =30 PLAX Hg LVID ES, 57.9 mm 23-38 LVOT chord, Peak vel, S 184 cm/s ------ PLAX Peak 14 mm ------ FS, chord, 16 % >29 gradient, S Hg PLAX Aortic valve LVPW, ED 10 mm ------ Peak vel, S 236 cm/s ------ IVS/LVPW 0.91 <1.3 Mean vel, S 153 cm/s ------ ratio, ED VTI, S 41.7 cm ------ Ventricular septum Mean 11 mm ------ IVS, ED 9.05 mm ------ gradient, S Hg Aortic valve Peak 22 mm ------ Leaflet 16 mm 15-26 gradient, S Hg separation Peak  vel 0.78 ------ Aorta ratio,  Root diam, 40 mm ------ LVOT/AV ED Regurg vel, 308 cm/s ------ Left atrium ED AP dim 36 mm ------ Regurg PHT 290 ms ------ AP dim 1.86 cm/m^2 <2.2 Regurg 38 mm ------ index gradient, ED Hg Mitral valve M-mode measurements Normal Peak E vel 153 cm/s ------ Aortic valve Mean vel, D 99.7 cm/s ------ Leaflet 16 mm 15-26 Mean 5 mm ------ separation gradient, D Hg Peak 12 mm ------ gradient, D Hg Annulus VTI 29 cm ------ Tricuspid valve Regurg peak 303 cm/s ------ vel Peak RV-RA 37 mm ------ gradient, S Hg Systemic veins Estimated 15 mm ------ CVP Hg Right ventricle Pressure, S 52 mm <30 Hg Sa vel, lat 10.4 cm/s ------ ann, tiss DP  ------------------------------------------------------------ Prepared and Electronically Authenticated by  [place] Bing, MD 2013-05-19T13:[id number]   Recent Radiology Findings:   She has no CT findings to evaluate the size of her aorta  Recent Lab Findings: Lab Results  Component Value Date   WBC 6.9 03/30/2012   HGB 10.5* 03/30/2012   HCT 33.0* 03/30/2012   PLT 226.0 03/30/2012   GLUCOSE 105* 03/30/2012   CHOL 195 03/30/2012   TRIG 82.0 03/30/2012   HDL 57.30 03/30/2012   LDLDIRECT 152.6 09/01/2011   LDLCALC 121* 03/30/2012   ALT 12 03/30/2012   AST 18 03/30/2012   NA 140 03/30/2012   K 4.0 03/30/2012   CL 106 03/30/2012   CREATININE 1.2 03/30/2012   BUN 22 03/30/2012   CO2 24 03/30/2012   TSH 2.263 02/15/2012   INR 1.06 02/15/2012      Assessment / Plan:    #1 moderate to severe aortic insufficiency with 20% ejection fraction left ventricular end-diastolic dimension of 69 mm 4 cm descending aorta by echo #2 inferior severe diffuse hypokinesis with absent systolic thickening of the inferior myocardium, coronary artery status is unknown #3 atrial fibrillation with recent left leg arterial embolus #4 generalized deconditioning #5 history of stroke in the past affecting the right side #6 limited renal reserve, stage III renal  insufficiency  Patient with severe aortic insufficiency which is now with a end-diastolic dimension of 69 mm up from 61 approximately one year ago and a 20% ejection fraction with class IV congestive heart failure makes the patient in a very high risk category for aortic valve replacement, the degree of LV function return maybe limited. These findings do not preclude aortic valve replacement but does limit what benefit she may gain from it. I discussed the risks and options of surgery with the patient and her daughter. She would like to proceeded with  Surgery. She will need a cardiac catheterization and CT of the chest to evaluate the size of her descending aorta.  Would consider proceeding with surgery on July 15 Monday, giving cardiology time the week before to get her admitted stabilized for cardiac catheterization and CT scan of the chest and off her anticoagulation, and ensure her renal function is stabilized prior to surgery.  We contacted Dr. Yevonne Pax office to arrange for admission and cardiac catheterization.     Delight Ovens MD  Beeper [phone] Office [phone] 03/31/2012 5:32 PM

## 2012-04-02 ENCOUNTER — Telehealth: Payer: Self-pay | Admitting: Cardiology

## 2012-04-02 ENCOUNTER — Telehealth: Payer: Self-pay | Admitting: *Deleted

## 2012-04-02 NOTE — Telephone Encounter (Signed)
Spoke with daughter/ she will contact the other sister to update her/ Dr Patty Sermons will direct admit pt tue 04/06/12 for valve surgery with Dr gerhardt. Family verbalized understanding.

## 2012-04-02 NOTE — Telephone Encounter (Signed)
New msg Pt's daughter called and wanted more info about procedure she is having. Please call

## 2012-04-02 NOTE — Telephone Encounter (Signed)
F/U   Patient daughter  [name] calling,  cath done on 7/9. Please advise when should blood thinner should stop.

## 2012-04-02 NOTE — Telephone Encounter (Signed)
Again, informed her that her mother is to stay on Xarelto over weekend till Tuesday when she is going to be direct admitted into the hospital by Dr Patty Sermons and then the hospital will stop xarelto when time appropriate. Daughter verbalized understanding.

## 2012-04-02 NOTE — Telephone Encounter (Signed)
Told pt to continue xarelto till goes to hospital Tuesday 7/9 and med will be addressed then. Family agreed to plan.

## 2012-04-06 ENCOUNTER — Encounter (HOSPITAL_COMMUNITY): Payer: Self-pay | Admitting: Pharmacy Technician

## 2012-04-06 ENCOUNTER — Other Ambulatory Visit: Payer: Self-pay | Admitting: Cardiology

## 2012-04-06 ENCOUNTER — Encounter (HOSPITAL_COMMUNITY): Payer: Self-pay

## 2012-04-06 ENCOUNTER — Inpatient Hospital Stay (HOSPITAL_COMMUNITY): Payer: Medicare Other

## 2012-04-06 ENCOUNTER — Inpatient Hospital Stay (HOSPITAL_COMMUNITY)
Admission: AD | Admit: 2012-04-06 | Discharge: 2012-04-22 | DRG: 216 | Disposition: A | Discharge status: Home / Self Care | Payer: Medicare Other | Source: Ambulatory Visit | Attending: Cardiothoracic Surgery | Admitting: Cardiothoracic Surgery

## 2012-04-06 ENCOUNTER — Other Ambulatory Visit: Payer: Self-pay | Admitting: *Deleted

## 2012-04-06 ENCOUNTER — Telehealth: Payer: Self-pay | Admitting: Cardiology

## 2012-04-06 DIAGNOSIS — Z86718 Personal history of other venous thrombosis and embolism: Secondary | ICD-10-CM

## 2012-04-06 DIAGNOSIS — I509 Heart failure, unspecified: Secondary | ICD-10-CM | POA: Diagnosis present

## 2012-04-06 DIAGNOSIS — I352 Nonrheumatic aortic (valve) stenosis with insufficiency: Secondary | ICD-10-CM | POA: Diagnosis present

## 2012-04-06 DIAGNOSIS — I351 Nonrheumatic aortic (valve) insufficiency: Secondary | ICD-10-CM

## 2012-04-06 DIAGNOSIS — R5381 Other malaise: Secondary | ICD-10-CM | POA: Diagnosis present

## 2012-04-06 DIAGNOSIS — N170 Acute kidney failure with tubular necrosis: Secondary | ICD-10-CM | POA: Diagnosis not present

## 2012-04-06 DIAGNOSIS — I5023 Acute on chronic systolic (congestive) heart failure: Secondary | ICD-10-CM

## 2012-04-06 DIAGNOSIS — I2789 Other specified pulmonary heart diseases: Secondary | ICD-10-CM | POA: Diagnosis present

## 2012-04-06 DIAGNOSIS — Z8673 Personal history of transient ischemic attack (TIA), and cerebral infarction without residual deficits: Secondary | ICD-10-CM

## 2012-04-06 DIAGNOSIS — D62 Acute posthemorrhagic anemia: Secondary | ICD-10-CM | POA: Diagnosis not present

## 2012-04-06 DIAGNOSIS — I4891 Unspecified atrial fibrillation: Secondary | ICD-10-CM | POA: Diagnosis present

## 2012-04-06 DIAGNOSIS — I10 Essential (primary) hypertension: Secondary | ICD-10-CM | POA: Diagnosis present

## 2012-04-06 DIAGNOSIS — I252 Old myocardial infarction: Secondary | ICD-10-CM

## 2012-04-06 DIAGNOSIS — E876 Hypokalemia: Secondary | ICD-10-CM

## 2012-04-06 DIAGNOSIS — I359 Nonrheumatic aortic valve disorder, unspecified: Secondary | ICD-10-CM | POA: Diagnosis present

## 2012-04-06 DIAGNOSIS — Z95810 Presence of automatic (implantable) cardiac defibrillator: Secondary | ICD-10-CM

## 2012-04-06 DIAGNOSIS — I5022 Chronic systolic (congestive) heart failure: Secondary | ICD-10-CM | POA: Diagnosis present

## 2012-04-06 DIAGNOSIS — E669 Obesity, unspecified: Secondary | ICD-10-CM | POA: Diagnosis present

## 2012-04-06 DIAGNOSIS — Z952 Presence of prosthetic heart valve: Secondary | ICD-10-CM

## 2012-04-06 HISTORY — DX: Anemia, unspecified: D64.9

## 2012-04-06 HISTORY — DX: Acute myocardial infarction, unspecified: I21.9

## 2012-04-06 HISTORY — DX: Presence of other vascular implants and grafts: Z95.828

## 2012-04-06 HISTORY — DX: Anxiety disorder, unspecified: F41.9

## 2012-04-06 HISTORY — DX: Cardiac murmur, unspecified: R01.1

## 2012-04-06 LAB — CBC
MCV: 97.2 fL (ref 78.0–100.0)
Platelets: 242 10*3/uL (ref 150–400)
RBC: 3.54 MIL/uL — ABNORMAL LOW (ref 3.87–5.11)
WBC: 5.5 10*3/uL (ref 4.0–10.5)

## 2012-04-06 LAB — COMPREHENSIVE METABOLIC PANEL
ALT: 12 U/L (ref 0–35)
AST: 23 U/L (ref 0–37)
Alkaline Phosphatase: 94 U/L (ref 39–117)
CO2: 29 mEq/L (ref 19–32)
Chloride: 105 mEq/L (ref 96–112)
GFR calc non Af Amer: 36 mL/min — ABNORMAL LOW (ref >90)
Potassium: 3.7 mEq/L (ref 3.5–5.1)
Sodium: 144 mEq/L (ref 135–145)
Total Bilirubin: 0.3 mg/dL (ref 0.3–1.2)

## 2012-04-06 LAB — CARDIAC PANEL(CRET KIN+CKTOT+MB+TROPI)
CK, MB: 1.8 ng/mL (ref 0.3–4.0)
Relative Index: INVALID (ref 0.0–2.5)
Troponin I: <0.3 ng/mL (ref <0.30)

## 2012-04-06 LAB — DIFFERENTIAL
Basophils Absolute: 0.1 10*3/uL (ref 0.0–0.1)
Basophils Relative: 1 % (ref 0–1)
Eosinophils Relative: 9 % — ABNORMAL HIGH (ref 0–5)
Monocytes Absolute: 0.9 10*3/uL (ref 0.1–1.0)
Monocytes Relative: 16 % — ABNORMAL HIGH (ref 3–12)

## 2012-04-06 MED ORDER — DIAZEPAM 5 MG PO TABS: 5.0000 mg | ORAL_TABLET | ORAL | Status: AC

## 2012-04-06 MED ORDER — SODIUM CHLORIDE 0.9 % IJ SOLN: 3.0000 mL | INTRAMUSCULAR | Status: DC | PRN

## 2012-04-06 MED ORDER — ACETAMINOPHEN 325 MG PO TABS
650.0000 mg | ORAL_TABLET | ORAL | Status: DC | PRN
  Administered 2012-04-08 – 2012-04-11 (×4): 650 mg via ORAL
  Filled 2012-04-06 (×3): qty 2

## 2012-04-06 MED ORDER — ONDANSETRON HCL 4 MG/2ML IJ SOLN: 4.0000 mg | Freq: Four times a day (QID) | INTRAMUSCULAR | Status: DC | PRN

## 2012-04-06 MED ORDER — SODIUM CHLORIDE 0.9 % IV SOLN
250.0000 mL | INTRAVENOUS | Status: DC | PRN
  Administered 2012-04-11: 250 mL via INTRAVENOUS

## 2012-04-06 MED ORDER — LOSARTAN POTASSIUM 25 MG PO TABS
25.0000 mg | ORAL_TABLET | Freq: Two times a day (BID) | ORAL | Status: DC
  Administered 2012-04-06 – 2012-04-11 (×10): 25 mg via ORAL
  Filled 2012-04-06 (×11): qty 1

## 2012-04-06 MED ORDER — SODIUM CHLORIDE 0.9 % IV SOLN: 250.0000 mL | INTRAVENOUS | Status: DC | PRN

## 2012-04-06 MED ORDER — ASPIRIN 81 MG PO CHEW: 324.0000 mg | CHEWABLE_TABLET | ORAL | Status: DC

## 2012-04-06 MED ORDER — SODIUM CHLORIDE 0.9 % IJ SOLN
3.0000 mL | Freq: Two times a day (BID) | INTRAMUSCULAR | Status: DC
  Administered 2012-04-06 – 2012-04-11 (×10): 3 mL via INTRAVENOUS

## 2012-04-06 MED ORDER — SODIUM CHLORIDE 0.9 % IV SOLN
1.0000 mL/kg/h | INTRAVENOUS | Status: DC
  Administered 2012-04-07 – 2012-04-08 (×3): 1 mL/kg/h via INTRAVENOUS

## 2012-04-06 MED ORDER — ALLOPURINOL 100 MG PO TABS
100.0000 mg | ORAL_TABLET | Freq: Every day | ORAL | Status: DC
  Administered 2012-04-06 – 2012-04-11 (×6): 100 mg via ORAL
  Filled 2012-04-06 (×7): qty 1

## 2012-04-06 MED ORDER — NITROGLYCERIN 0.4 MG/SPRAY TL SOLN
1.0000 | Status: DC | PRN
  Filled 2012-04-06: qty 4.9

## 2012-04-06 MED ORDER — FUROSEMIDE 40 MG PO TABS
40.0000 mg | ORAL_TABLET | Freq: Every day | ORAL | Status: DC
  Administered 2012-04-06 – 2012-04-09 (×3): 40 mg via ORAL
  Filled 2012-04-06 (×4): qty 1

## 2012-04-06 MED ORDER — SODIUM CHLORIDE 0.9 % IV SOLN: 200.0000 mg | Freq: Every day | INTRAVENOUS | Status: DC

## 2012-04-06 MED ORDER — SODIUM CHLORIDE 0.9 % IJ SOLN: 3.0000 mL | Freq: Two times a day (BID) | INTRAMUSCULAR | Status: DC

## 2012-04-06 MED ORDER — POTASSIUM CHLORIDE CRYS ER 20 MEQ PO TBCR
20.0000 meq | EXTENDED_RELEASE_TABLET | Freq: Every day | ORAL | Status: DC
  Administered 2012-04-07 – 2012-04-11 (×5): 20 meq via ORAL
  Filled 2012-04-06 (×6): qty 1

## 2012-04-06 MED ORDER — FERROUS SULFATE 325 (65 FE) MG PO TABS
325.0000 mg | ORAL_TABLET | Freq: Every day | ORAL | Status: DC
  Administered 2012-04-07 – 2012-04-11 (×4): 325 mg via ORAL
  Filled 2012-04-06 (×7): qty 1

## 2012-04-06 MED ORDER — ASPIRIN 81 MG PO CHEW
324.0000 mg | CHEWABLE_TABLET | ORAL | Status: AC
  Administered 2012-04-08: 324 mg via ORAL
  Filled 2012-04-06: qty 4

## 2012-04-06 MED ORDER — DIAZEPAM 5 MG PO TABS
5.0000 mg | ORAL_TABLET | ORAL | Status: AC
  Administered 2012-04-08: 5 mg via ORAL
  Filled 2012-04-06 (×2): qty 1

## 2012-04-06 MED ORDER — CARVEDILOL 3.125 MG PO TABS
3.1250 mg | ORAL_TABLET | Freq: Two times a day (BID) | ORAL | Status: DC
  Administered 2012-04-06 – 2012-04-11 (×11): 3.125 mg via ORAL
  Filled 2012-04-06 (×14): qty 1

## 2012-04-06 NOTE — Telephone Encounter (Signed)
Pt is to have a heart cath on Thursday the 11th, was to be admitted today but when she called the hospital they have no record of it, pls call pt with info

## 2012-04-06 NOTE — Telephone Encounter (Signed)
Fu call Pt calling back again and is confused please call

## 2012-04-06 NOTE — Telephone Encounter (Signed)
Called daughter and she will take her mother to the hospital for admission

## 2012-04-06 NOTE — H&P (Signed)
[name] is an 71 y.o. female.   Chief Complaint: Exertional dyspnea HPI: This 71 year old woman has a history of chronic atrial fibrillation.  She was recently hospitalized acutely for a left popliteal embolus requiring urgent vascular surgery by Dr. [name].  Prior to her embolus she had not been on long-term anticoagulation because of a remote history of intracerebral hemorrhage.  Following her popliteal embolus she has been on Xarelto 15 mg daily.  She has had no further embolic events.  Recent echocardiogram showed that her ejection fraction had decreased to 20% from a previous ejection fraction in January 2011 of 45-50%.  She has had known severe aortic insufficiency.  Recently her symptoms of exertional dyspnea and fatigue have increased.  She has reached the point that she is willing to proceed with preparation for aortic valve replacement.  He was recently seen by Dr. [name] on 03/31/12.  The patient also has a history of a thoracic aortic aneurysm.  This has not been evaluated recently.  The patient is being admitted now to undergo cardiac catheterization and further preparation for anticipated aortic valve replacement by Dr. [name] on Monday 04/12/12.  Of note is the fact that the patient has a past history of an ICD in place because of the previous history of ventricular tachycardia.  The patient also has a history of renal insufficiency hypertension prior intracerebral stroke and doubt  Past Medical History  Diagnosis Date  . History of ventricular tachycardia   . DOE (dyspnea on exertion)   . Peripheral edema   . Aortic insufficiency     Moderate 2012  . 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 History of gout   . Exogenous obesity   . History of CHF (congestive heart failure)     EF 45% 2012, nonischemic (Normal coronaries 2002)  . Renal insufficiency   . 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  Hypertension   . Stroke     Past Surgical History  Procedure Date  . Cardiac defibrillator placement   . Cataract extraction   . Embolectomy 02/15/2012    Procedure: EMBOLECTOMY;  Surgeon: [name], MD;  Location: [facility] OR;  Service: Vascular;  Laterality: Left;  left popliteal embolectomy with drain placement    Family History  Problem Relation Age of Onset  . Hypertension Father   . Cancer Father   . Coronary artery disease Father 30   Social History:  reports that she has never smoked. She has never used smokeless tobacco. She reports that she does not drink alcohol or use illicit drugs.  Allergies:  Allergies  Allergen Reactions  . Latex Rash  . Neomycin-Bacitracin Zn-Polymyx Rash         Review of systems: The patient has a history of easy bruising.  She has been on recent Xarelto.  She has not been on aspirin because of easy bruising.  She has a remote history of deep vein thrombosis and a vena cava filter was placed in 2002 because she was not felt to be a candidate for Coumadin at that time because of her prior intracerebral hemorrhage.  There were no vitals taken for this visit. The general appearance reveals a well-developed mildly obese woman in no acute distress.Pupils equal and reactive.   Extraocular Movements are full.  There is no scleral icterus.  The mouth and pharynx are normal.  The neck is supple.  The carotids reveal no bruits.  The jugular venous pressure is normal.  The thyroid is not enlarged.  There is no lymphadenopathy.  The chest reveals bibasilar inspiratory rales.  The heart reveals an irregular rhythm and atrial fibrillation and a grade 3/6 prominent decrescendo murmur of aortic insufficiency at the left sternal edge.The abdomen is soft and nontender. Bowel sounds are normal. The liver and spleen are not enlarged. There Are no abdominal masses. There are no bruits.  Extremities reveal trace edema.  Pedal pulses are present.  She does  have easy bruising.  Integument reveals no skin rashStrength is normal and symmetrical in all extremities.  There is no lateralizing weakness.  There are no sensory deficits.    Assessment/Plan 1.  Acute on chronic systolic congestive heart failure secondary to severe aortic insufficiency. 2.  Chronic atrial fibrillation with recent left popliteal embolus.  Now on Xarelto 15 mg daily. 3.  Remote history of inferior vena cava filter in place since 2002 for a prior history of deep vein thrombosis. 4.  History of intracerebral hemorrhage remotely. 5.  History of gout 6.  History of essential hypertension 7.  History of dyslipidemia but is intolerant of statin therapy 8.  Chronic renal insufficiency 9.  History of venous insufficiency and remote history of deep vein thrombosis lower extremities  Plan: Stop Xarelto.  Prepare for left heart cardiac catheterization on 04/08/12 after optimization of renal function.  During this hospitalization we will also wish to obtain a CT scan with contrast of her thoracic aorta.  We will need to watch her hydration up closely cause of her renal insufficiency.  [name] 04/06/2012, 1:41 PM

## 2012-04-07 ENCOUNTER — Inpatient Hospital Stay (HOSPITAL_COMMUNITY): Payer: Medicare Other

## 2012-04-07 DIAGNOSIS — Z01810 Encounter for preprocedural cardiovascular examination: Secondary | ICD-10-CM

## 2012-04-07 DIAGNOSIS — I359 Nonrheumatic aortic valve disorder, unspecified: Secondary | ICD-10-CM

## 2012-04-07 LAB — BASIC METABOLIC PANEL
BUN: 24 mg/dL — ABNORMAL HIGH (ref 6–23)
CO2: 28 mEq/L (ref 19–32)
Chloride: 104 mEq/L (ref 96–112)
Creatinine, Ser: 1.29 mg/dL — ABNORMAL HIGH (ref 0.50–1.10)
Potassium: 3.3 mEq/L — ABNORMAL LOW (ref 3.5–5.1)

## 2012-04-07 LAB — PRO B NATRIURETIC PEPTIDE: Pro B Natriuretic peptide (BNP): 2714 pg/mL — ABNORMAL HIGH (ref 0–125)

## 2012-04-07 LAB — RETICULOCYTES
Retic Count, Absolute: 88.9 10*3/uL (ref 19.0–186.0)
Retic Ct Pct: 2.6 % (ref 0.4–3.1)

## 2012-04-07 LAB — PULMONARY FUNCTION TEST

## 2012-04-07 LAB — PROTIME-INR
INR: 1.41 (ref 0.00–1.49)
Prothrombin Time: 17.5 seconds — ABNORMAL HIGH (ref 11.6–15.2)

## 2012-04-07 LAB — FERRITIN: Ferritin: 42 ng/mL (ref 10–291)

## 2012-04-07 MED ORDER — ALBUTEROL SULFATE (5 MG/ML) 0.5% IN NEBU
2.5000 mg | INHALATION_SOLUTION | Freq: Once | RESPIRATORY_TRACT | Status: AC
  Administered 2012-04-07: 2.5 mg via RESPIRATORY_TRACT

## 2012-04-07 NOTE — Progress Notes (Signed)
Subjective:  Patient had a good night. No chest pain or increased dyspnea.  Rhythm atrial fib with controlled ventricular response.  Objective:  Vital Signs in the last 24 hours: Temp:  [97.4 F (36.3 C)] 97.4 F (36.3 C) (07/10 0429) Pulse Rate:  [70-89] 71  (07/10 0429) Resp:  [16-18] 18  (07/10 0429) BP: (128-152)/(63-76) 152/76 mmHg (07/10 0429) SpO2:  [95 %-97 %] 95 % (07/10 0429) Weight:  [178 lb 9.6 oz (81.012 kg)-178 lb 12.7 oz (81.1 kg)] 178 lb 12.7 oz (81.1 kg) (07/10 0429)  Intake/Output from previous day: 07/09 0701 - 07/10 0700 In: 240 [P.O.:240] Out: 1151 [Urine:1150; Stool:1] Intake/Output from this shift:       . allopurinol  100 mg Oral Daily  . aspirin  324 mg Oral Pre-Cath  . carvedilol  3.125 mg Oral BID WC  . diazepam  5 mg Oral On Call  . diazepam  5 mg Oral On Call  . ferrous sulfate  325 mg Oral Q breakfast  . furosemide  40 mg Oral Daily  . losartan  25 mg Oral BID  . potassium chloride  20 mEq Oral Daily  . sodium chloride  3 mL Intravenous Q12H  . DISCONTD: allopurinol (ZYLOPRIM) IVPB  200 mg Intravenous Daily  . DISCONTD: aspirin  324 mg Oral Pre-Cath  . DISCONTD: sodium chloride  3 mL Intravenous Q12H      . sodium chloride 1 mL/kg/hr (04/07/12 0411)    Physical Exam: The patient appears to be in no distress.  Head and neck exam reveals that the pupils are equal and reactive.  The extraocular movements are full.  There is no scleral icterus.  Mouth and pharynx are benign.  No lymphadenopathy.  No carotid bruits.  The jugular venous pressure is normal.  Thyroid is not enlarged or tender.  Chest reveals mild bibasilar rales.  Heart reveals no abnormal lift or heave.  First and second heart sounds are normal.  Grade 3/6 decrescendo AI murmur along LSE.  The abdomen is soft and nontender.  Bowel sounds are normoactive.  There is no hepatosplenomegaly or mass.  There are no abdominal bruits.  Extremities reveal no trace edema right, 1+  on left. Pedal pulses are good.  There is no cyanosis or clubbing.  Neurologic exam is normal strength and no lateralizing weakness.  No sensory deficits.  Integument reveals no rash  Lab Results:  Basename 04/06/12 1730  WBC 5.5  HGB 10.8*  PLT 242    Basename 04/07/12 0510 04/06/12 1730  NA 144 144  K 3.3* 3.7  CL 104 105  CO2 28 29  GLUCOSE 90 95  BUN 24* 26*  CREATININE 1.29* 1.44*    Basename 04/06/12 1731  TROPONINI <0.30   Hepatic Function Panel  Basename 04/06/12 1730  PROT 7.2  ALBUMIN 3.6  AST 23  ALT 12  ALKPHOS 94  BILITOT 0.3  BILIDIR --  IBILI --   No results found for this basename: CHOL in the last 72 hours No results found for this basename: PROTIME in the last 72 hours  Imaging: Dg Chest 2 View  04/07/2012  *RADIOLOGY REPORT*  Clinical Data: Shortness of breath.  CHEST - 2 VIEW  Comparison: Chest radiograph performed 02/15/2012  Findings: The lungs are well-aerated.  Persistent vascular congestion is noted.  There is no evidence of focal opacification, pleural effusion or pneumothorax.  The heart is mildly enlarged.  An AICD is noted at the left chest wall, with  a single lead ending at the right ventricle.  No acute osseous abnormalities are seen.  IMPRESSION: Persistent vascular congestion and mild cardiomegaly noted; no acute cardiopulmonary process seen.  Original Report Authenticated By: Tonia Ghent, M.D.    Cardiac Studies:  Assessment/Plan:  Patient Active Hospital Problem List: Aortic insufficiency and aortic stenosis (01/01/2011)   Assessment: Chest xray stable. Renal function shows creat 1.29, okay for cath tomorrow.   Plan: Left heart cath and probable right heart cath in a.m. (She has prior IVC filter so right heart cath would have to be done from IJ) Acute on chronic systolic congestive heart failure, NYHA class 3 (02/14/2012)   Assessment: Stable on  Low dose lasix.   Plan:Daily BMET Chronic atrial fib    Xarelto on hold.  After  cath will need IV heparin until aortic valve surgery Monday.  Dilated aortic root     Will plan CT of chest Friday, use contrast if kidneys okay after cath.   LOS: 1 day    [name] 04/07/2012, 9:00 AM

## 2012-04-07 NOTE — Progress Notes (Signed)
Patient ID: [name], female   DOB: Mar 22, 1941, 71 y.o.   MRN: [id number] Aware of Patient in Hospital, for cath tomorrow and CT of chest Friday, needs to bridge off of anticoagulation because of recent arterial embolus to left leg.  I have seen and examined Vernie Ammons and agree with the  plan.  Delight Ovens MD Beeper [phone] Office [phone] 04/07/2012 9:42 PM

## 2012-04-07 NOTE — Clinical Social Work Psychosocial (Signed)
     Clinical Social Work Department BRIEF PSYCHOSOCIAL ASSESSMENT 04/07/2012  Patient:  [name], [name]     Account Number:  [id number]     Admit date:  04/06/2012  Clinical Social Worker:  Robin Searing  Date/Time:  04/07/2012 11:08 AM  Referred by:  RN  Date Referred:  04/07/2012 Referred for  SNF Placement   Other Referral:   Interview type:  Patient Other interview type:    PSYCHOSOCIAL DATA Living Status:  FAMILY Admitted from facility:   Level of care:   Primary support name:  daughterJoyce Gross Primary support relationship to patient:  FAMILY Degree of support available:   good    CURRENT CONCERNS Current Concerns  Post-Acute Placement   Other Concerns:    SOCIAL WORK ASSESSMENT / PLAN Familiar with patient fromprevious hospitalization and SNF placement in May. She is now back scheduled for AVR on Monday and is already planning for SNF. She would like to return to [facility] for SNF care- I will go ahead and begin this process in anticipation of this need next week.  Patient spoke openly with me about her fears related to the upcoming surgery- "I may not make it off the table". She states she has had frank conversations with the surgeon and her cardiologist regarding options to do procedure or not- she is confident in her decision to proceed with the surgery.   Assessment/plan status:  Other - See comment Other assessment/ plan:   Patient is in good spirits- she is optimisitic, has good support, and will plan for SNF at d/c. Will begin FL2 completion and SNF Search as appropriate.   Information/referral to community resources:   SNF list    PATIENTS/FAMILYS RESPONSE TO PLAN OF CARE: Patient appreciative and agreeable to plans as above- will continue to check in on patient and offer support and encouragement pre-op as well as proceed with SNF plans.

## 2012-04-07 NOTE — Progress Notes (Addendum)
Pre-op Cardiac Surgery  Carotid Findings: Bilateral no significant ICA stenosis with antegrade vertebral flow.  Upper Extremity Right Left  Brachial Pressures 197 168  Radial Waveforms Tri Tri  Ulnar Waveforms Tri Tri  Palmar Arch (Allen's Test) normal Waveform remains normal with radial compression and decreases >50% with ulnar compression   Farrel Demark, RDMS 04/07/2012 3:17 PM

## 2012-04-07 NOTE — Care Management Note (Unsigned)
    Page 1 of 2   04/21/2012     9:14:47 AM   CARE MANAGEMENT NOTE 04/21/2012  Patient:  [name], [name]   Account Number:  [id number]  Date Initiated:  04/07/2012  Documentation initiated by:  [name],Mical Kicklighter  Subjective/Objective Assessment:   ADMITTED WITH CHF/ AI; LIVES AT HOME WITH DAUGHTER.     Action/Plan:   DISCHARGE PLANNING- WANTS TO GO TO [facility] AFTER SURGERY.   Anticipated DC Date:  04/21/2012   Anticipated DC Plan:  HOME W HOME HEALTH SERVICES  In-house referral  Clinical Social Worker      DC Planning Services  CM consult      Choice offered to / List presented to:  C-1 Patient        HH arranged  HH-1 RN  HH-2 PT      [facility] agency  [facility]   Status of service:  In process, will continue to follow Medicare Important Message given?   (If response is "NO", the following Medicare IM given date fields will be blank) Date Medicare IM given:   Date Additional Medicare IM given:    Discharge Disposition:    Per UR Regulation:  Reviewed for med. necessity/level of care/duration of stay  If discussed at Long Length of Stay Meetings, dates discussed:   04/20/2012    Comments:  04/21/12  0912  Heather Perry SIMMONS RN, BSN [phone] PT RECOMMENDS HHPT; DISCUSSED WITH PT AT BEDSIDE; REFERRAL PLACED TO [facility] WITH [facility] PER CHOICE; SHE HAS ALL NECESSARY DME ALREADY AT HOME;  MD/ PA- PLEASE ORDER HHRN/PT AR DISCHARGE.  04/14/12 Heather AMERSON,RN,BSN 1106 PT S/P AVR ON 04/12/12.  PTA, PT LIVES WITH DAUGHTER, BUT HAS MADE PLANS TO DC TO [facility] AT DISCHARGE. CLINICAL SOCIAL WORKER FOLLOWING.  WILL FOLLOW TO ASSIST WITH DC PLANNING.  04/07/12  1050  Heather Benko SIMMONS RN, BSN [phone] CSW FOLLOWING.

## 2012-04-08 ENCOUNTER — Encounter: Payer: Medicare Other | Admitting: Internal Medicine

## 2012-04-08 ENCOUNTER — Encounter (HOSPITAL_COMMUNITY)
Admission: AD | Disposition: A | Discharge status: Home / Self Care | Payer: Self-pay | Source: Ambulatory Visit | Attending: Cardiothoracic Surgery

## 2012-04-08 DIAGNOSIS — I2510 Atherosclerotic heart disease of native coronary artery without angina pectoris: Secondary | ICD-10-CM

## 2012-04-08 DIAGNOSIS — I509 Heart failure, unspecified: Secondary | ICD-10-CM

## 2012-04-08 DIAGNOSIS — I359 Nonrheumatic aortic valve disorder, unspecified: Secondary | ICD-10-CM

## 2012-04-08 HISTORY — PX: LEFT HEART CATHETERIZATION WITH CORONARY ANGIOGRAM: SHX5451

## 2012-04-08 LAB — POCT I-STAT 3, ART BLOOD GAS (G3+)
Acid-Base Excess: 2 mmol/L (ref 0.0–2.0)
Bicarbonate: 26.6 mEq/L — ABNORMAL HIGH (ref 20.0–24.0)
O2 Saturation: 98 %
TCO2: 28 mmol/L (ref 0–100)
pO2, Arterial: 111 mmHg — ABNORMAL HIGH (ref 80.0–100.0)

## 2012-04-08 LAB — POCT I-STAT 3, VENOUS BLOOD GAS (G3P V)
Bicarbonate: 26.4 mEq/L — ABNORMAL HIGH (ref 20.0–24.0)
O2 Saturation: 60 %
TCO2: 28 mmol/L (ref 0–100)

## 2012-04-08 LAB — BASIC METABOLIC PANEL
Calcium: 8.8 mg/dL (ref 8.4–10.5)
Creatinine, Ser: 1.08 mg/dL (ref 0.50–1.10)
GFR calc Af Amer: 59 mL/min — ABNORMAL LOW (ref >90)

## 2012-04-08 LAB — PROTIME-INR: Prothrombin Time: 14.4 seconds (ref 11.6–15.2)

## 2012-04-08 SURGERY — LEFT HEART CATHETERIZATION WITH CORONARY ANGIOGRAM
Anesthesia: LOCAL

## 2012-04-08 MED ORDER — FENTANYL CITRATE 0.05 MG/ML IJ SOLN
INTRAMUSCULAR | Status: AC
  Filled 2012-04-08: qty 2

## 2012-04-08 MED ORDER — LIDOCAINE HCL (PF) 1 % IJ SOLN
INTRAMUSCULAR | Status: AC
  Filled 2012-04-08: qty 30

## 2012-04-08 MED ORDER — MIDAZOLAM HCL 2 MG/2ML IJ SOLN
INTRAMUSCULAR | Status: AC
  Filled 2012-04-08: qty 2

## 2012-04-08 MED ORDER — POTASSIUM CHLORIDE CRYS ER 20 MEQ PO TBCR
20.0000 meq | EXTENDED_RELEASE_TABLET | Freq: Once | ORAL | Status: AC
  Administered 2012-04-08: 20 meq via ORAL
  Filled 2012-04-08: qty 1

## 2012-04-08 MED ORDER — HEPARIN (PORCINE) IN NACL 2-0.9 UNIT/ML-% IJ SOLN
INTRAMUSCULAR | Status: AC
  Filled 2012-04-08: qty 2000

## 2012-04-08 MED ORDER — HEPARIN (PORCINE) IN NACL 100-0.45 UNIT/ML-% IJ SOLN
1200.0000 [IU]/h | INTRAMUSCULAR | Status: DC
  Administered 2012-04-09: 1050 [IU]/h via INTRAVENOUS
  Administered 2012-04-10 (×2): 1200 [IU]/h via INTRAVENOUS
  Filled 2012-04-08 (×3): qty 250

## 2012-04-08 MED ORDER — NITROGLYCERIN 0.2 MG/ML ON CALL CATH LAB
INTRAVENOUS | Status: AC
  Filled 2012-04-08: qty 1

## 2012-04-08 MED ORDER — ACETAMINOPHEN 325 MG PO TABS
ORAL_TABLET | ORAL | Status: AC
  Filled 2012-04-08: qty 2

## 2012-04-08 MED ORDER — ONDANSETRON HCL 4 MG/2ML IJ SOLN: 4.0000 mg | Freq: Four times a day (QID) | INTRAMUSCULAR | Status: DC | PRN

## 2012-04-08 MED ORDER — SODIUM CHLORIDE 0.9 % IV SOLN: INTRAVENOUS | Status: AC

## 2012-04-08 MED ORDER — ACETAMINOPHEN 325 MG PO TABS: 650.0000 mg | ORAL_TABLET | ORAL | Status: DC | PRN

## 2012-04-08 NOTE — CV Procedure (Signed)
   Cardiac Catheterization Procedure Note  Name: [name] MRN: [id number] DOB: October 01, 1940  Procedure: Right Heart Cath, Left Heart Cath, Selective Coronary Angiography, aortic root aortogram  Indication: Severe aortic insufficiency with heart failure, pre-cardiac surgery evaluation.   Procedural Details: The right groin was prepped, draped, and anesthetized with 1% lidocaine. Using the modified Seldinger technique a 5 French sheath was placed in the right femoral artery and a 6 French sheath was placed in the right femoral vein. A 6 French multipurpose catheter was used for the right heart catheterization. The patient has an IVC filter and the catheter was guided under fluoroscopy so that there was no trauma to the filter. Standard protocol was followed for recording of right heart pressures and sampling of oxygen saturations. Fick cardiac output was calculated. Standard Judkins catheters were used for selective coronary angiography and aortic root angiography. For the left coronary artery, a JL 5 catheter was used. There were no immediate procedural complications. The patient was transferred to the post catheterization recovery area for further monitoring.  Procedural Findings: Hemodynamics RA 17 RV 68/14 PA 69/31 with a mean of 48 PCWP 28 LV 165/29 AO 171/69 with a mean of 108  Oxygen saturations: PA 60% AO 98%  Cardiac Output (Fick) 4.3 L per  Cardiac Index (Fick) 2.4 L per minute per meters   Coronary angiography: Coronary dominance: right  Left mainstem: The left mainstem is patent. There is no obstructive disease.  Left anterior descending (LAD): The LAD is patent to the left ventricular apex. The vessel wraps around the apex. There are minor irregularities but no significant stenoses throughout. The first diagonal is a large caliber vessel without significant disease  Left circumflex (LCx): The left circumflex is patent. There is a large first OM with no significant  disease. The proximal circumflex has 30% stenosis.  Right coronary artery (RCA): The RCA is dominant. This is a large vessels. It is smooth throughout its course. There is no obstructive disease present. He gives off a PDA and 2 posterolateral branches without significant disease.  Aortic root angiography: There is severe aortic insufficiency. The left ventricle is opacified and appears to have severe diffuse hypokinesis.  Final Conclusions:   1. Mild nonobstructive diffuse coronary artery disease. 2. Severe aortic insufficiency with known severe LV dysfunction 3. Elevated left heart and right heart pressures with severe pulmonary hypertension and elevated left ventricular filling pressure.   the patient is pending cardiac surgery early next week. considering her atrial fibrillation and history of arterial embolus within the past 6 months. She will be started on intravenous unfractionated heparin   Tonny Bollman 04/08/2012, 11:06 AM

## 2012-04-08 NOTE — Progress Notes (Signed)
ANTICOAGULATION CONSULT NOTE - Initial Consult  Pharmacy Consult for heparin Indication: atrial fibrillation/arterial thrombus  Allergies  Allergen Reactions  . Latex Rash  . Neomycin-Bacitracin Zn-Polymyx Rash    Patient Measurements: Height: 5\' 2"  (157.5 cm) Weight: 177 lb 14.6 oz (80.7 kg) IBW/kg (Calculated) : 50.1  Heparin Dosing Weight: 68  Vital Signs: Temp: 97 F (36.1 C) (07/11 1330) Temp src: Oral (07/11 1330) BP: 140/50 mmHg (07/11 1330) Pulse Rate: 76  (07/11 1330)  Labs:  Basename 04/08/12 0545 04/07/12 0510 04/06/12 1731 04/06/12 1730  HGB -- -- -- 10.8*  HCT -- -- -- 34.4*  PLT -- -- -- 242  APTT -- -- -- 40*  LABPROT 14.4 17.5* -- 22.9*  INR 1.10 1.41 -- 1.99*  HEPARINUNFRC -- -- -- --  CREATININE 1.08 1.29* -- 1.44*  CKTOTAL -- -- 38 --  CKMB -- -- 1.8 --  TROPONINI -- -- <0.30 --    Estimated Creatinine Clearance: 47.7 ml/min (by C-G formula based on Cr of 1.08).   Medical History: Past Medical History  Diagnosis Date  . History of ventricular tachycardia   . Peripheral edema   . Aortic insufficiency     Moderate 2012  . History of gout   . Exogenous obesity   . History of CHF (congestive heart failure)     EF 45% 2012, nonischemic (Normal coronaries 2002)  . 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 Hypertension   . 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 ICD (implantable cardiac defibrillator) in place 2012  . DOE (dyspnea on exertion)   . Stroke 2000  . Renal insufficiency   . Myocardial infarction 2000  . Anxiety   . Heart murmur   . Anemia   . Presence of IVC filter 2000    Medications:  Scheduled:    . allopurinol  100 mg Oral Daily  . aspirin  324 mg Oral Pre-Cath  . carvedilol  3.125 mg Oral BID WC  . diazepam  5 mg Oral On Call  . fentaNYL      . ferrous sulfate  325 mg Oral Q breakfast  . furosemide  40 mg Oral Daily  . heparin      . lidocaine      .  losartan  25 mg Oral BID  . midazolam      . nitroGLYCERIN      . potassium chloride  20 mEq Oral Daily  . potassium chloride  20 mEq Oral Once  . sodium chloride  3 mL Intravenous Q12H    Assessment: 71 yo with a hx of afib and arterial thrombus. She was admitted for aortic valve replacement on 7/15. She has been on Xarelto for anticoagulation. Last dose was on 7/8. She is s/p cath now. Plan to bridge with IV heparin until surgery.   Goal of Therapy:  Heparin level 0.3-0.7 units/ml Monitor platelets by anticoagulation protocol: Yes   Plan:  1. Heparin drip at 900 units/hr 2. F/u with 8 hr heparin level 3. Daily heparin level and cbc  Ulyses Southward [place] 04/08/2012,1:45 PM

## 2012-04-08 NOTE — H&P (View-Only) (Signed)
Subjective:  Patient had a good night. No chest pain or increased dyspnea.  Rhythm atrial fib with controlled ventricular response. For cath today. K slightly low 3.4 so will give extra K now.  Objective:  Vital Signs in the last 24 hours: Temp:  [96.7 F (35.9 C)-98.7 F (37.1 C)] 96.7 F (35.9 C) (07/11 0617) Pulse Rate:  [68-80] 78  (07/11 0617) Resp:  [18] 18  (07/11 0617) BP: (129-149)/(66-76) 148/76 mmHg (07/11 0617) SpO2:  [96 %-99 %] 97 % (07/11 0617) Weight:  [177 lb 14.6 oz (80.7 kg)] 177 lb 14.6 oz (80.7 kg) (07/11 0617)  Intake/Output from previous day: 07/10 0701 - 07/11 0700 In: 2045.8 [P.O.:120; I.V.:1925.8] Out: 950 [Urine:950] Intake/Output from this shift:       . albuterol  2.5 mg Nebulization Once  . allopurinol  100 mg Oral Daily  . aspirin  324 mg Oral Pre-Cath  . carvedilol  3.125 mg Oral BID WC  . diazepam  5 mg Oral On Call  . ferrous sulfate  325 mg Oral Q breakfast  . furosemide  40 mg Oral Daily  . losartan  25 mg Oral BID  . potassium chloride  20 mEq Oral Daily  . potassium chloride  20 mEq Oral Once  . sodium chloride  3 mL Intravenous Q12H      . sodium chloride 1 mL/kg/hr (04/08/12 0500)    Physical Exam: The patient appears to be in no distress.  Head and neck exam reveals that the pupils are equal and reactive.  The extraocular movements are full.  There is no scleral icterus.  Mouth and pharynx are benign.  No lymphadenopathy.  No carotid bruits.  The jugular venous pressure is normal.  Thyroid is not enlarged or tender.  Chest reveals mild bibasilar rales.  Heart reveals no abnormal lift or heave.  First and second heart sounds are normal.  Grade 3/6 decrescendo AI murmur along LSE.  The abdomen is soft and nontender.  Bowel sounds are normoactive.  There is no hepatosplenomegaly or mass.  There are no abdominal bruits.  Extremities reveal no trace edema right, 1+ on left. Pedal pulses are good.  There is no cyanosis or  clubbing.  Neurologic exam is normal strength and no lateralizing weakness.  No sensory deficits.  Integument reveals no rash  Lab Results:  Basename 04/06/12 1730  WBC 5.5  HGB 10.8*  PLT 242    Basename 04/08/12 0545 04/07/12 0510  NA 144 144  K 3.4* 3.3*  CL 108 104  CO2 25 28  GLUCOSE 92 90  BUN 17 24*  CREATININE 1.08 1.29*    Basename 04/06/12 1731  TROPONINI <0.30   Hepatic Function Panel  Basename 04/06/12 1730  PROT 7.2  ALBUMIN 3.6  AST 23  ALT 12  ALKPHOS 94  BILITOT 0.3  BILIDIR --  IBILI --   No results found for this basename: CHOL in the last 72 hours No results found for this basename: PROTIME in the last 72 hours  Imaging: Dg Chest 2 View  04/07/2012  *RADIOLOGY REPORT*  Clinical Data: Shortness of breath.  CHEST - 2 VIEW  Comparison: Chest radiograph performed 02/15/2012  Findings: The lungs are well-aerated.  Persistent vascular congestion is noted.  There is no evidence of focal opacification, pleural effusion or pneumothorax.  The heart is mildly enlarged.  An AICD is noted at the left chest wall, with a single lead ending at the right ventricle.  No acute  osseous abnormalities are seen.  IMPRESSION: Persistent vascular congestion and mild cardiomegaly noted; no acute cardiopulmonary process seen.  Original Report Authenticated By: Tonia Ghent, M.D.    Cardiac Studies:  Assessment/Plan:  Patient Active Hospital Problem List: Aortic insufficiency and aortic stenosis (01/01/2011)     No change in murmur.  For right and left cath today.   Acute on chronic systolic congestive heart failure, NYHA class 3 (02/14/2012)   Assessment: Stable on  Low dose lasix.   Plan:Daily BMET.  Replete K+.   Chronic atrial fib    Xarelto on hold.  After cath will need IV heparin until aortic valve surgery Monday.  Dilated aortic root     Will plan CT of chest Friday, use contrast if kidneys okay after cath. Anemia   Serum iron low. Continue iron. Check  stools.  LOS: 2 days    [name] 04/08/2012, 7:29 AM

## 2012-04-08 NOTE — Plan of Care (Signed)
Problem: Phase I Progression Outcomes Goal: Vascular site scale level 0 - I Vascular Site Scale Level 0: No bruising/bleeding/hematoma Level I (Mild): Bruising/Ecchymosis, minimal bleeding/ooozing, palpable hematoma < 3 cm Level II (Moderate): Bleeding not affecting hemodynamic parameters, pseudoaneurysm, palpable hematoma > 3 cm  Outcome: Completed/Met Date Met:  04/08/12 Level 0

## 2012-04-08 NOTE — Progress Notes (Signed)
Patient evaluated for long-term disease management services with [facility] Care Management Program as a benefit of her [facility] insurance plan. Noted discharge plan is SNF when medically stable.   Raiford Noble, MSN-Ed, RN, BSN [facility] Liaison [phone]

## 2012-04-08 NOTE — Progress Notes (Signed)
 Subjective:  Patient had a good night. No chest pain or increased dyspnea.  Rhythm atrial fib with controlled ventricular response. For cath today. K slightly low 3.4 so will give extra K now.  Objective:  Vital Signs in the last 24 hours: Temp:  [96.7 F (35.9 C)-98.7 F (37.1 C)] 96.7 F (35.9 C) (07/11 0617) Pulse Rate:  [68-80] 78  (07/11 0617) Resp:  [18] 18  (07/11 0617) BP: (129-149)/(66-76) 148/76 mmHg (07/11 0617) SpO2:  [96 %-99 %] 97 % (07/11 0617) Weight:  [177 lb 14.6 oz (80.7 kg)] 177 lb 14.6 oz (80.7 kg) (07/11 0617)  Intake/Output from previous day: 07/10 0701 - 07/11 0700 In: 2045.8 [P.O.:120; I.V.:1925.8] Out: 950 [Urine:950] Intake/Output from this shift:       . albuterol  2.5 mg Nebulization Once  . allopurinol  100 mg Oral Daily  . aspirin  324 mg Oral Pre-Cath  . carvedilol  3.125 mg Oral BID WC  . diazepam  5 mg Oral On Call  . ferrous sulfate  325 mg Oral Q breakfast  . furosemide  40 mg Oral Daily  . losartan  25 mg Oral BID  . potassium chloride  20 mEq Oral Daily  . potassium chloride  20 mEq Oral Once  . sodium chloride  3 mL Intravenous Q12H      . sodium chloride 1 mL/kg/hr (04/08/12 0500)    Physical Exam: The patient appears to be in no distress.  Head and neck exam reveals that the pupils are equal and reactive.  The extraocular movements are full.  There is no scleral icterus.  Mouth and pharynx are benign.  No lymphadenopathy.  No carotid bruits.  The jugular venous pressure is normal.  Thyroid is not enlarged or tender.  Chest reveals mild bibasilar rales.  Heart reveals no abnormal lift or heave.  First and second heart sounds are normal.  Grade 3/6 decrescendo AI murmur along LSE.  The abdomen is soft and nontender.  Bowel sounds are normoactive.  There is no hepatosplenomegaly or mass.  There are no abdominal bruits.  Extremities reveal no trace edema right, 1+ on left. Pedal pulses are good.  There is no cyanosis or  clubbing.  Neurologic exam is normal strength and no lateralizing weakness.  No sensory deficits.  Integument reveals no rash  Lab Results:  Basename 04/06/12 1730  WBC 5.5  HGB 10.8*  PLT 242    Basename 04/08/12 0545 04/07/12 0510  NA 144 144  K 3.4* 3.3*  CL 108 104  CO2 25 28  GLUCOSE 92 90  BUN 17 24*  CREATININE 1.08 1.29*    Basename 04/06/12 1731  TROPONINI <0.30   Hepatic Function Panel  Basename 04/06/12 1730  PROT 7.2  ALBUMIN 3.6  AST 23  ALT 12  ALKPHOS 94  BILITOT 0.3  BILIDIR --  IBILI --   No results found for this basename: CHOL in the last 72 hours No results found for this basename: PROTIME in the last 72 hours  Imaging: Dg Chest 2 View  04/07/2012  *RADIOLOGY REPORT*  Clinical Data: Shortness of breath.  CHEST - 2 VIEW  Comparison: Chest radiograph performed 02/15/2012  Findings: The lungs are well-aerated.  Persistent vascular congestion is noted.  There is no evidence of focal opacification, pleural effusion or pneumothorax.  The heart is mildly enlarged.  An AICD is noted at the left chest wall, with a single lead ending at the right ventricle.  No acute   osseous abnormalities are seen.  IMPRESSION: Persistent vascular congestion and mild cardiomegaly noted; no acute cardiopulmonary process seen.  Original Report Authenticated By: JEFFREY CHANG, M.D.    Cardiac Studies:  Assessment/Plan:  Patient Active Hospital Problem List: Aortic insufficiency and aortic stenosis (01/01/2011)     No change in murmur.  For right and left cath today.   Acute on chronic systolic congestive heart failure, NYHA class 3 (02/14/2012)   Assessment: Stable on  Low dose lasix.   Plan:Daily BMET.  Replete K+.   Chronic atrial fib    Xarelto on hold.  After cath will need IV heparin until aortic valve surgery Monday.  Dilated aortic root     Will plan CT of chest Friday, use contrast if kidneys okay after cath. Anemia   Serum iron low. Continue iron. Check  stools.  LOS: 2 days    Kaiden Pech 04/08/2012, 7:29 AM    

## 2012-04-08 NOTE — Interval H&P Note (Signed)
History and Physical Interval Note:  04/08/2012 10:12 AM  [name]  has presented today for surgery, with the diagnosis of ai  The various methods of treatment have been discussed with the patient and family. After consideration of risks, benefits and other options for treatment, the patient has consented to  Procedure(s) (LRB): LEFT HEART CATHETERIZATION WITH CORONARY ANGIOGRAM (N/A) as a surgical intervention .  The patient's history has been reviewed, patient examined, no change in status, stable for surgery.  I have reviewed the patients' chart and labs.  Questions were answered to the patient's satisfaction.     Tonny Bollman  04/08/2012 10:12 AM

## 2012-04-09 ENCOUNTER — Inpatient Hospital Stay (HOSPITAL_COMMUNITY): Payer: Medicare Other

## 2012-04-09 ENCOUNTER — Encounter: Payer: Medicare Other | Admitting: Internal Medicine

## 2012-04-09 DIAGNOSIS — I359 Nonrheumatic aortic valve disorder, unspecified: Secondary | ICD-10-CM

## 2012-04-09 LAB — CBC WITH DIFFERENTIAL/PLATELET
Eosinophils Absolute: 0.5 10*3/uL (ref 0.0–0.7)
Eosinophils Relative: 8 % — ABNORMAL HIGH (ref 0–5)
Hemoglobin: 10.4 g/dL — ABNORMAL LOW (ref 12.0–15.0)
Lymphs Abs: 1.8 10*3/uL (ref 0.7–4.0)
MCH: 30.2 pg (ref 26.0–34.0)
MCV: 96.8 fL (ref 78.0–100.0)
Monocytes Relative: 13 % — ABNORMAL HIGH (ref 3–12)
RBC: 3.44 MIL/uL — ABNORMAL LOW (ref 3.87–5.11)

## 2012-04-09 LAB — BASIC METABOLIC PANEL
BUN: 17 mg/dL (ref 6–23)
CO2: 22 mEq/L (ref 19–32)
Calcium: 9.2 mg/dL (ref 8.4–10.5)
Glucose, Bld: 91 mg/dL (ref 70–99)
Potassium: 4.2 mEq/L (ref 3.5–5.1)
Sodium: 140 mEq/L (ref 135–145)

## 2012-04-09 MED ORDER — FUROSEMIDE 40 MG PO TABS
40.0000 mg | ORAL_TABLET | Freq: Two times a day (BID) | ORAL | Status: DC
  Administered 2012-04-09 – 2012-04-11 (×4): 40 mg via ORAL
  Filled 2012-04-09 (×6): qty 1

## 2012-04-09 MED ORDER — IOHEXOL 350 MG/ML SOLN: 100.0000 mL | Freq: Once | INTRAVENOUS | Status: AC | PRN

## 2012-04-09 MED ORDER — IOHEXOL 350 MG/ML SOLN
100.0000 mL | Freq: Once | INTRAVENOUS | Status: AC | PRN
  Administered 2012-04-09: 100 mL via INTRAVENOUS

## 2012-04-09 NOTE — Progress Notes (Signed)
Patient for AVR on Monday- planning for SNF at d/c at [facility]. Patient and family agreeable to this plan. Will need PT/OT consults post op for insurance auth for SNF. Reece Levy, MSW, Theresia Majors [phone]

## 2012-04-09 NOTE — Progress Notes (Signed)
Physical Therapy NOte: order received, chart reviewed, began speaking with pt for eval. RN entered and stated pt had to be in bed for her to start IV then was going to CT and deferred eval at this time. Will attempt next date. Thanks Delaney Meigs, PT [phone]

## 2012-04-09 NOTE — Progress Notes (Signed)
ANTICOAGULATION CONSULT NOTE - Follow-Up Consult  Pharmacy Consult for heparin Indication: atrial fibrillation/arterial thrombus  Allergies  Allergen Reactions  . Latex Rash  . Neomycin-Bacitracin Zn-Polymyx Rash    Patient Measurements: Height: 5\' 2"  (157.5 cm) Weight: 177 lb 14.6 oz (80.7 kg) IBW/kg (Calculated) : 50.1  Heparin Dosing Weight: 68  Vital Signs: Temp: 97.7 F (36.5 C) (07/12 0500) Temp src: Oral (07/12 0453) BP: 150/80 mmHg (07/12 0453) Pulse Rate: 83  (07/12 0453)  Labs:  Basename 04/09/12 0535 04/08/12 0545 04/07/12 0510 04/06/12 1731 04/06/12 1730  HGB 10.4* -- -- -- 10.8*  HCT 33.3* -- -- -- 34.4*  PLT 208 -- -- -- 242  APTT -- -- -- -- 40*  LABPROT -- 14.4 17.5* -- 22.9*  INR -- 1.10 1.41 -- 1.99*  HEPARINUNFRC 0.28* -- -- -- --  CREATININE -- 1.08 1.29* -- 1.44*  CKTOTAL -- -- -- 38 --  CKMB -- -- -- 1.8 --  TROPONINI -- -- -- <0.30 --    Estimated Creatinine Clearance: 47.7 ml/min (by C-G formula based on Cr of 1.08).   Medical History: Past Medical History  Diagnosis Date  . History of ventricular tachycardia   . Peripheral edema   . Aortic insufficiency     Moderate 2012  . History of gout   . Exogenous obesity   . History of CHF (congestive heart failure)     EF 45% 2012, nonischemic (Normal coronaries 2002)  . 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 Hypertension   . 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 ICD (implantable cardiac defibrillator) in place 2012  . DOE (dyspnea on exertion)   . Stroke 2000  . Renal insufficiency   . Myocardial infarction 2000  . Anxiety   . Heart murmur   . Anemia   . Presence of IVC filter 2000   Assessment: 71 yo with a hx of afib and arterial thrombus. She was admitted for aortic valve replacement on 7/15. She has been on Xarelto for anticoagulation. Last dose was on 7/8. She is s/p cath now. Plan to bridge with IV heparin until  surgery. Heparin level subtherapeutic on 900 units/hr.  Goal of Therapy:  Heparin level 0.3-0.7 units/ml Monitor platelets by anticoagulation protocol: Yes   Plan:  1. Increase heparin gtt to 1050 units/hr 2. F/u with 8 hr heparin level  Christoper Fabian, PharmD, BCPS Clinical pharmacist, pager [phone] 04/09/2012,6:51 AM

## 2012-04-09 NOTE — Progress Notes (Signed)
ANTICOAGULATION CONSULT NOTE - Follow-Up Consult  Pharmacy Consult for heparin Indication: atrial fibrillation/hx of arterial thrombus  Assessment: 71 yo with a hx of afib and arterial thrombus. She was admitted for aortic valve replacement on 7/15. She has been on Xarelto for anticoagulation. Last dose was on 7/8, now bridging with IV heparin until surgery. Heparin level (0.4) is therapeutic on 1050 units/hr.  Goal of Therapy:  Heparin level 0.3-0.7 units/ml Monitor platelets by anticoagulation protocol: Yes   Plan:  1. Continue heparin gtt at 1050 units/hr 2. F/u heparin level and cbc tomorrow morning.  Bayard Hugger, PharmD, BCPS  Clinical Pharmacist  Pager: [phone]  04/09/2012,3:32 PM   Allergies  Allergen Reactions  . Latex Rash  . Neomycin-Bacitracin Zn-Polymyx Rash    Patient Measurements: Height: 5\' 2"  (157.5 cm) Weight: 181 lb 4.8 oz (82.237 kg) IBW/kg (Calculated) : 50.1  Heparin Dosing Weight: 68  Vital Signs: Temp: 97.6 F (36.4 C) (07/12 1316) Temp src: Oral (07/12 1316) BP: 172/75 mmHg (07/12 1316) Pulse Rate: 85  (07/12 1316)  Labs:  Basename 04/09/12 1438 04/09/12 0535 04/08/12 0545 04/07/12 0510 04/06/12 1731 04/06/12 1730  HGB -- 10.4* -- -- -- 10.8*  HCT -- 33.3* -- -- -- 34.4*  PLT -- 208 -- -- -- 242  APTT -- -- -- -- -- 40*  LABPROT -- -- 14.4 17.5* -- 22.9*  INR -- -- 1.10 1.41 -- 1.99*  HEPARINUNFRC 0.40 0.28* -- -- -- --  CREATININE -- 1.09 1.08 1.29* -- --  CKTOTAL -- -- -- -- 38 --  CKMB -- -- -- -- 1.8 --  TROPONINI -- -- -- -- <0.30 --    Estimated Creatinine Clearance: 47.7 ml/min (by C-G formula based on Cr of 1.09).   Medical History: Past Medical History  Diagnosis Date  . History of ventricular tachycardia   . Peripheral edema   . Aortic insufficiency     Moderate 2012  . History of gout   . Exogenous obesity   . History of CHF (congestive heart failure)     EF 45% 2012, nonischemic (Normal coronaries 2002)  .  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 Hypertension   . 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 ICD (implantable cardiac defibrillator) in place 2012  . DOE (dyspnea on exertion)   . Stroke 2000  . Renal insufficiency   . Myocardial infarction 2000  . Anxiety   . Heart murmur   . Anemia   . Presence of IVC filter 2000

## 2012-04-09 NOTE — Progress Notes (Signed)
Subjective:  Remains dyspneic with minimal activity. Cath yesterday tolerated well. Renal function okay this am so will proceed with CT of chest with contrast to look at ascending aorta size. Weight up 4 lb so will increase lasix.  Objective:  Vital Signs in the last 24 hours: Temp:  [97 F (36.1 C)-97.7 F (36.5 C)] 97.7 F (36.5 C) (07/12 0500) Pulse Rate:  [58-97] 83  (07/12 0453) Resp:  [16-18] 16  (07/12 0453) BP: (73-150)/(44-80) 150/80 mmHg (07/12 0453) SpO2:  [96 %-98 %] 96 % (07/12 0453) Weight:  [181 lb 4.8 oz (82.237 kg)] 181 lb 4.8 oz (82.237 kg) (07/12 0500)  Intake/Output from previous day: 07/11 0701 - 07/12 0700 In: 480 [P.O.:480] Out: 450 [Urine:450] Intake/Output from this shift:       . allopurinol  100 mg Oral Daily  . carvedilol  3.125 mg Oral BID WC  . diazepam  5 mg Oral On Call  . fentaNYL      . ferrous sulfate  325 mg Oral Q breakfast  . furosemide  40 mg Oral BID  . heparin      . lidocaine      . losartan  25 mg Oral BID  . midazolam      . nitroGLYCERIN      . potassium chloride  20 mEq Oral Daily  . sodium chloride  3 mL Intravenous Q12H  . DISCONTD: furosemide  40 mg Oral Daily      . sodium chloride    . heparin 1,050 Units/hr (04/09/12 0654)  . DISCONTD: sodium chloride 1 mL/kg/hr (04/08/12 0500)    Physical Exam: The patient appears to be in no distress.  Head and neck exam reveals that the pupils are equal and reactive.  The extraocular movements are full.  There is no scleral icterus.  Mouth and pharynx are benign.  No lymphadenopathy.  No carotid bruits.  The jugular venous pressure is normal.  Thyroid is not enlarged or tender.  Chest is clear to percussion and auscultation.  No rales or rhonchi.  Expansion of the chest is symmetrical.  Heart reveals no abnormal lift or heave.  First and second heart sounds are normal.  There is no murmur gallop rub or click.  The abdomen is soft and nontender.  Bowel sounds are  normoactive.  There is no hepatosplenomegaly or mass.  There are no abdominal bruits.  Extremities reveal no phlebitis or edema.  Pedal pulses are good.  There is no cyanosis or clubbing.  Neurologic exam is normal strength and no lateralizing weakness.  No sensory deficits.  Integument reveals no rash  Lab Results:  Basename 04/09/12 0535 04/06/12 1730  WBC 6.2 5.5  HGB 10.4* 10.8*  PLT 208 242    Basename 04/09/12 0535 04/08/12 0545  NA 140 144  K 4.2 3.4*  CL 107 108  CO2 22 25  GLUCOSE 91 92  BUN 17 17  CREATININE 1.09 1.08    Basename 04/06/12 1731  TROPONINI <0.30   Hepatic Function Panel  Basename 04/06/12 1730  PROT 7.2  ALBUMIN 3.6  AST 23  ALT 12  ALKPHOS 94  BILITOT 0.3  BILIDIR --  IBILI --   No results found for this basename: CHOL in the last 72 hours No results found for this basename: PROTIME in the last 72 hours  Imaging: Imaging results have been reviewed  Cardiac Studies:  Assessment/Plan:  Patient Active Hospital Problem List: Aortic insufficiency and aortic stenosis (01/01/2011)  Assessment: Cath yesterday showed no significant CAD.  Significant pulmonary hypertension.   Plan: Aortic valve replacement Monday by Dr. [name].  CT chest today. Acute on chronic systolic congestive heart failure, NYHA class 3 (02/14/2012)   Assessment: More dyspneic today.  Weight up. BUN stable   Plan: Increase Lasix to BID.  Follow BMET   LOS: 3 days    [name] 04/09/2012, 8:48 AM

## 2012-04-10 DIAGNOSIS — I509 Heart failure, unspecified: Secondary | ICD-10-CM

## 2012-04-10 DIAGNOSIS — I5023 Acute on chronic systolic (congestive) heart failure: Principal | ICD-10-CM

## 2012-04-10 LAB — CBC WITH DIFFERENTIAL/PLATELET
Basophils Absolute: 0 10*3/uL (ref 0.0–0.1)
Eosinophils Absolute: 0.4 10*3/uL (ref 0.0–0.7)
Eosinophils Relative: 5 % (ref 0–5)
HCT: 32.8 % — ABNORMAL LOW (ref 36.0–46.0)
Lymphocytes Relative: 24 % (ref 12–46)
MCH: 30.8 pg (ref 26.0–34.0)
MCHC: 32 g/dL (ref 30.0–36.0)
MCV: 96.2 fL (ref 78.0–100.0)
Monocytes Absolute: 1.1 10*3/uL — ABNORMAL HIGH (ref 0.1–1.0)
Platelets: 204 10*3/uL (ref 150–400)
RDW: 16.1 % — ABNORMAL HIGH (ref 11.5–15.5)
WBC: 6.4 10*3/uL (ref 4.0–10.5)

## 2012-04-10 NOTE — Plan of Care (Signed)
Problem: Phase II Progression Outcomes Goal: Walk in hall or up in chair TID Outcome: Not Met (add Reason) Inc sob with any acticity  Comments:  Inc sob

## 2012-04-10 NOTE — Progress Notes (Signed)
Patient ID: [name], female   DOB: 12-21-1940, 71 y.o.   MRN: [id number]   SUBJECTIVE:  Patient is feeling well today. She had her chest CT yesterday. It has already been reviewed by Dr. [name]. She is stable and looking forward to surgery on Monday.   Filed Vitals:   04/09/12 2105 04/10/12 0300 04/10/12 0435 04/10/12 0900  BP: 147/70  126/62 121/74  Pulse: 80  83 76  Temp: 97.3 F (36.3 C)  97.9 F (36.6 C)   TempSrc: Oral  Oral   Resp: 18  18   Height:      Weight:  177 lb 4 oz (80.4 kg)    SpO2: 96%  91%     Intake/Output Summary (Last 24 hours) at 04/10/12 1040 Last data filed at 04/10/12 0959  Gross per 24 hour  Intake    729 ml  Output   3450 ml  Net  -2721 ml    LABS: Basic Metabolic Panel:  Basename 04/09/12 0535 04/08/12 0545  NA 140 144  K 4.2 3.4*  CL 107 108  CO2 22 25  GLUCOSE 91 92  BUN 17 17  CREATININE 1.09 1.08  CALCIUM 9.2 8.8  MG -- --  PHOS -- --   Liver Function Tests: No results found for this basename: AST:2,ALT:2,ALKPHOS:2,BILITOT:2,PROT:2,ALBUMIN:2 in the last 72 hours No results found for this basename: LIPASE:2,AMYLASE:2 in the last 72 hours CBC:  Basename 04/10/12 0610 04/09/12 0535  WBC 6.4 6.2  NEUTROABS 3.4 3.1  HGB 10.5* 10.4*  HCT 32.8* 33.3*  MCV 96.2 96.8  PLT 204 208   Cardiac Enzymes: No results found for this basename: CKTOTAL:3,CKMB:3,CKMBINDEX:3,TROPONINI:3 in the last 72 hours BNP: No components found with this basename: POCBNP:3 D-Dimer: No results found for this basename: DDIMER:2 in the last 72 hours Hemoglobin A1C: No results found for this basename: HGBA1C in the last 72 hours Fasting Lipid Panel: No results found for this basename: CHOL,HDL,LDLCALC,TRIG,CHOLHDL,LDLDIRECT in the last 72 hours Thyroid Function Tests: No results found for this basename: TSH,T4TOTAL,FREET3,T3FREE,THYROIDAB in the last 72 hours  RADIOLOGY: Dg Chest 2 View  04/07/2012  *RADIOLOGY REPORT*  Clinical Data: Shortness  of breath.  CHEST - 2 VIEW  Comparison: Chest radiograph performed 02/15/2012  Findings: The lungs are well-aerated.  Persistent vascular congestion is noted.  There is no evidence of focal opacification, pleural effusion or pneumothorax.  The heart is mildly enlarged.  An AICD is noted at the left chest wall, with a single lead ending at the right ventricle.  No acute osseous abnormalities are seen.  IMPRESSION: Persistent vascular congestion and mild cardiomegaly noted; no acute cardiopulmonary process seen.  Original Report Authenticated By: Tonia Ghent, M.D.   Ct Angio Chest W/cm &/or Wo Cm  04/09/2012  *RADIOLOGY REPORT*  Clinical Data: Preop.  Severe aortic insufficiency.  CT ANGIOGRAPHY CHEST  Technique:  Multidetector CT imaging of the chest using the standard protocol during bolus administration of intravenous contrast. Multiplanar reconstructed images including MIPs were obtained and reviewed to evaluate the vascular anatomy.  Contrast: OMNIPAQUE IOHEXOL 350 MG/ML SOLN  Comparison: None.  Findings: The present examination was performed as a pulmonary embolus protocol and not during aortic phase imaging limiting evaluation.  Taking this limitation into account, the ascending thoracic aorta 4.6 x 5 cm. Calcified aortic arch, descending thoracic aorta, upper abdominal aorta and origin of the great vessels.  Cardiomegaly.  Left ventricular hypertrophy may be present.  AICD is in place with the tip in  the region of the right atrium. Coronary artery calcifications.  No pulmonary embolus.  There may be increased right heart pressure.  Small pleural effusions.  Septal thickening.  This may reflect pulmonary vascular congestion/mild congestive heart failure.  Subcarinal adenopathy measuring up to 2.9 x 2.2 cm transverse dimension.  Etiology indeterminate.  Although this may be reactive origin, this will require follow-up.  Additionally, left base lobulated 1.4 x 1.3 x 0.7 cm nodule.  Right renal 4 cm  cyst.  Degenerative changes thoracic spine.  IMPRESSION: Ascending thoracic aorta measures 4.6 x 5 cm maximal transverse dimension.  Atherosclerotic type changes remainder of visualized arterial structures as noted above.  Cardiomegaly.  AICD is in place.  No pulmonary embolus.  Subcarinal adenopathy measuring up to 2.9 x 2.2 cm transverse dimension.  Additionally, left base lobulated 1.4 x 1.3 x 0.7 cm nodule. This will require follow-up.  This can be addressed on the patient's postoperative exam or CT chest in 3 months.  Examination with reviewed with Dr. [name]  04/09/2038 7:45 p.m. Repeat imaging will not be performed preoperatively after this reviewer as dissection is not of concern and adequate measurements of the ascending thoracic aorta were obtained on the present exam.  Original Report Authenticated By: Fuller Canada, M.D.    PHYSICAL EXAM   Patient is oriented to person time and place. Affect is normal. There is no jugulovenous distention. Lungs reveal a few scattered rhonchi. There is no respiratory distress. Cardiac exam reveals S1 and S2. There is a crescendo decrescendo systolic murmur and a murmur of aortic insufficiency. Abdomen is soft. There is no significant peripheral edema.   TELEMETRY: I have personally reviewed telemetry today April 10, 2012. There is atrial fibrillation. The rate is controlled.   ASSESSMENT AND PLAN:   Atrial fibrillation    HR fibrillation rate is controlled. Patient is on heparin.   Aortic insufficiency and aortic stenosis     The plan is for surgery on Monday.  Acute on chronic systolic congestive heart failure, NYHA class 3    The patient diuresis yesterday. She'll be kept on the same meds. We will follow her volume status carefully.   Willa Rough 04/10/2012 10:40 AM

## 2012-04-10 NOTE — Plan of Care (Signed)
Problem: Phase II Progression Outcomes Goal: Walk in hall or up in chair TID Outcome: Progressing Ambulated length of hall and back with PT SOB but tol. Well with good recovery time. Pt. Up in chair x2 today

## 2012-04-10 NOTE — Evaluation (Signed)
Physical Therapy Evaluation Patient Details Name: [name] MRN: [id number] DOB: 1941/03/15 Today's Date: 04/10/2012 Time: 4098-1191 PT Time Calculation (min): 27 min  PT Assessment / Plan / Recommendation Clinical Impression  Pt admitted with aortic insufficiency and Afib with planned AVR 7/15. Pt very familiar from prior admission and plans to discharge to SNF due to dgtr works and no 24hr supervision available. Pt educated for sternal precaution pre-op and for goals. Will follow pt post-op once cleared by MD with re-eval after surgery. Pt encouraged to ambulate with nursing throughout the day to maintain strength pre-op.     PT Assessment  Patient needs continued PT services    Follow Up Recommendations  Skilled nursing facility    Barriers to Discharge Decreased caregiver support dgtr works    Equipment Recommendations  None recommended by PT    Recommendations for Other Services     Frequency Min 3X/week    Precautions / Restrictions Precautions Precautions: None Restrictions Weight Bearing Restrictions: No   Pertinent Vitals/Pain No pain      Mobility  Bed Mobility Bed Mobility: Not assessed Transfers Transfers: Sit to Stand;Stand to Sit Sit to Stand: 6: Modified independent (Device/Increase time);From chair/3-in-1;4: Min guard Stand to Sit: 6: Modified independent (Device/Increase time);To chair/3-in-1;4: Min guard Details for Transfer Assistance: Pt stood from chair with armrests without assist but educated for upcoming sternal precautions and attempted without armrests requiring guarding due to partial LoB Ambulation/Gait Ambulation/Gait Assistance: 6: Modified independent (Device/Increase time) Ambulation Distance (Feet): 450 Feet Assistive device: Rolling walker Ambulation/Gait Assistance Details: cueing to step into RW x 2, pt able to ambulate slowly while talking the entire time with 1/4 dyspnea Gait Pattern: Decreased stride length;Step-through  pattern Gait velocity: decreased Stairs: No    Exercises     PT Diagnosis: Difficulty walking  PT Problem List: Decreased mobility;Decreased activity tolerance;Decreased knowledge of use of DME PT Treatment Interventions: Gait training;DME instruction;Functional mobility training;Therapeutic activities;Patient/family education;Therapeutic exercise   PT Goals Acute Rehab PT Goals PT Goal Formulation: With patient Time For Goal Achievement: 04/24/12 Potential to Achieve Goals: Good Pt will go Supine/Side to Sit: with supervision PT Goal: Supine/Side to Sit - Progress: Goal set today Pt will go Sit to Supine/Side: with supervision PT Goal: Sit to Supine/Side - Progress: Goal set today Pt will go Sit to Stand: with modified independence PT Goal: Sit to Stand - Progress: Goal set today Pt will go Stand to Sit: with modified independence PT Goal: Stand to Sit - Progress: Goal set today PT Transfer Goal: Bed to Chair/Chair to Bed - Progress: Discontinued (comment) (goal from prior admission) Pt will Ambulate: >150 feet;with modified independence PT Goal: Ambulate - Progress: Goal set today PT Goal: Up/Down Stairs - Progress: Discontinued (comment) (from prior admission)  Visit Information  Last PT Received On: 04/10/12 Assistance Needed: +1    Subjective Data  Subjective: I hope I don't have to stay in a SNF permanently after surgery Patient Stated Goal: have surgery and go home   Prior Functioning  Home Living Lives With: Daughter Available Help at Discharge: Friend(s);Available PRN/intermittently Type of Home: House Home Access: Stairs to enter [employer] of Steps: 3 Entrance Stairs-Rails: Right;Left Home Layout: Multi-level Alternate Level Stairs-Number of Steps: 13 Alternate Level Stairs-Rails: Right;Left Bathroom Shower/Tub: Health visitor: Standard Home Adaptive Equipment: Shower chair without back;Walker - rolling;Walker - standard;Bedside  commode/3-in-1 Prior Function Level of Independence: Independent Able to Take Stairs?: Yes Driving: Yes Vocation: Retired Comments: 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 out with her lunch group each week Communication Communication: No difficulties Dominant Hand: Right    Cognition  Overall Cognitive Status: Appears within functional limits for tasks assessed/performed Arousal/Alertness: Awake/alert Orientation Level: Appears intact for tasks assessed Behavior During Session: [facility] for tasks performed    Extremity/Trunk Assessment Right Upper Extremity Assessment RUE ROM/Strength/Tone: [facility] for tasks assessed Left Upper Extremity Assessment LUE ROM/Strength/Tone: [facility] for tasks assessed Right Lower Extremity Assessment RLE ROM/Strength/Tone: [facility] for tasks assessed Left Lower Extremity Assessment LLE ROM/Strength/Tone: [facility] for tasks assessed   Balance    End of Session PT - End of Session Equipment Utilized During Treatment: Gait belt Activity Tolerance: Patient tolerated treatment well Patient left: in chair;with call bell/phone within reach Nurse Communication: Mobility status  GP     Delorse Lek 04/10/2012, 10:34 AM  Delaney Meigs, PT [phone]

## 2012-04-10 NOTE — Progress Notes (Signed)
ANTICOAGULATION CONSULT NOTE - Follow-Up Consult  Pharmacy Consult for heparin Indication: atrial fibrillation/hx of arterial thrombus  Assessment: 71 yo with a hx of afib and arterial thrombus. She was admitted for aortic valve replacement on 7/15. She has been on Xarelto for anticoagulation. Last dose was on 7/8, now bridging with IV heparin until surgery. Heparin level (0.24) subtherapeutic this morning on 1050 units/hr.  Goal of Therapy:  Heparin level 0.3-0.7 units/ml Monitor platelets by anticoagulation protocol: Yes   Plan:  1. Increase heparin gtt to 1200 units/hr 2. F/u heparin level 8 hrs post rate change.  Bayard Hugger, PharmD, BCPS  Clinical Pharmacist  Pager: [phone]  04/09/2012,3:32 PM   Allergies  Allergen Reactions  . Latex Rash  . Neomycin-Bacitracin Zn-Polymyx Rash    Patient Measurements: Height: 5\' 2"  (157.5 cm) Weight: 177 lb 4 oz (80.4 kg) IBW/kg (Calculated) : 50.1  Heparin Dosing Weight: 68  Vital Signs: Temp: 97.9 F (36.6 C) (07/13 0435) Temp src: Oral (07/13 0435) BP: 121/74 mmHg (07/13 0900) Pulse Rate: 76  (07/13 0900)  Labs:  Basename 04/10/12 0610 04/09/12 1438 04/09/12 0535 04/08/12 0545  HGB 10.5* -- 10.4* --  HCT 32.8* -- 33.3* --  PLT 204 -- 208 --  APTT -- -- -- --  LABPROT -- -- -- 14.4  INR -- -- -- 1.10  HEPARINUNFRC 0.24* 0.40 0.28* --  CREATININE -- -- 1.09 1.08  CKTOTAL -- -- -- --  CKMB -- -- -- --  TROPONINI -- -- -- --    Estimated Creatinine Clearance: 47.2 ml/min (by C-G formula based on Cr of 1.09).   Medical History: Past Medical History  Diagnosis Date  . History of ventricular tachycardia   . Peripheral edema   . Aortic insufficiency     Moderate 2012  . History of gout   . Exogenous obesity   . History of CHF (congestive heart failure)     EF 45% 2012, nonischemic (Normal coronaries 2002)  . 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 Hypertension   . 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 ICD (implantable cardiac defibrillator) in place 2012  . DOE (dyspnea on exertion)   . Stroke 2000  . Renal insufficiency   . Myocardial infarction 2000  . Anxiety   . Heart murmur   . Anemia   . Presence of IVC filter 2000

## 2012-04-10 NOTE — Progress Notes (Signed)
ANTICOAGULATION CONSULT NOTE - Follow-Up Consult  Pharmacy Consult for heparin Indication: atrial fibrillation/hx of arterial thrombus  Assessment: 71 yo with a hx of afib and arterial thrombus. She was admitted for aortic valve replacement on 7/15. She has been on Xarelto for anticoagulation. Last dose was on 7/8, now bridging with IV heparin until surgery. Heparin level (0.24) subtherapeutic this morning on 1050 units/hr.  Heparin drip rate was increased to 1200 uts/hr and now HL 0.48.  No bleeding noted.  Goal of Therapy:  Heparin level 0.3-0.7 units/ml Monitor platelets by anticoagulation protocol: Yes   Plan:   Continue heparin 1200 uts/hr  Daily heparin levels   [name] Pharm.D. CPP, BCPS Clinical Pharmacist [phone] 04/10/2012 7:30 PM     Allergies  Allergen Reactions  . Latex Rash  . Neomycin-Bacitracin Zn-Polymyx Rash    Patient Measurements: Height: 5\' 2"  (157.5 cm) Weight: 177 lb 4 oz (80.4 kg) IBW/kg (Calculated) : 50.1  Heparin Dosing Weight: 68  Vital Signs: Temp: 97.6 F (36.4 C) (07/13 1425) Temp src: Oral (07/13 1425) BP: 121/74 mmHg (07/13 0900) Pulse Rate: 84  (07/13 1425)  Labs:  Basename 04/10/12 1821 04/10/12 0610 04/09/12 1438 04/09/12 0535 04/08/12 0545  HGB -- 10.5* -- 10.4* --  HCT -- 32.8* -- 33.3* --  PLT -- 204 -- 208 --  APTT -- -- -- -- --  LABPROT -- -- -- -- 14.4  INR -- -- -- -- 1.10  HEPARINUNFRC 0.48 0.24* 0.40 -- --  CREATININE -- -- -- 1.09 1.08  CKTOTAL -- -- -- -- --  CKMB -- -- -- -- --  TROPONINI -- -- -- -- --    Estimated Creatinine Clearance: 47.2 ml/min (by C-G formula based on Cr of 1.09).   Medical History: Past Medical History  Diagnosis Date  . History of ventricular tachycardia   . Peripheral edema   . Aortic insufficiency     Moderate 2012  . History of gout   . Exogenous obesity   . History of CHF (congestive heart failure)     EF 45% 2012, nonischemic (Normal coronaries 2002)  . Automatic  implantable cardiac defibrillator single-chamber-Medtronic 11/04/2011  . Hypertension   . 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 ICD (implantable cardiac defibrillator) in place 2012  . DOE (dyspnea on exertion)   . Stroke 2000  . Renal insufficiency   . Myocardial infarction 2000  . Anxiety   . Heart murmur   . Anemia   . Presence of IVC filter 2000

## 2012-04-11 DIAGNOSIS — E876 Hypokalemia: Secondary | ICD-10-CM

## 2012-04-11 LAB — BASIC METABOLIC PANEL
CO2: 26 mEq/L (ref 19–32)
Calcium: 9 mg/dL (ref 8.4–10.5)
Creatinine, Ser: 1.14 mg/dL — ABNORMAL HIGH (ref 0.50–1.10)
Glucose, Bld: 90 mg/dL (ref 70–99)

## 2012-04-11 LAB — CBC WITH DIFFERENTIAL/PLATELET
Basophils Relative: 1 % (ref 0–1)
Eosinophils Absolute: 0.5 10*3/uL (ref 0.0–0.7)
Eosinophils Relative: 7 % — ABNORMAL HIGH (ref 0–5)
Hemoglobin: 10.4 g/dL — ABNORMAL LOW (ref 12.0–15.0)
Lymphs Abs: 1.9 10*3/uL (ref 0.7–4.0)
MCH: 30.3 pg (ref 26.0–34.0)
MCHC: 32 g/dL (ref 30.0–36.0)
MCV: 94.8 fL (ref 78.0–100.0)
Monocytes Relative: 16 % — ABNORMAL HIGH (ref 3–12)
Platelets: 208 10*3/uL (ref 150–400)
RBC: 3.43 MIL/uL — ABNORMAL LOW (ref 3.87–5.11)

## 2012-04-11 LAB — SURGICAL PCR SCREEN: MRSA, PCR: NEGATIVE

## 2012-04-11 MED ORDER — TRANEXAMIC ACID (OHS) BOLUS VIA INFUSION
15.0000 mg/kg | INTRAVENOUS | Status: AC
  Administered 2012-04-12: 1188 mg via INTRAVENOUS
  Filled 2012-04-11: qty 1188

## 2012-04-11 MED ORDER — METOPROLOL TARTRATE 12.5 MG HALF TABLET
12.5000 mg | ORAL_TABLET | Freq: Once | ORAL | Status: AC
  Administered 2012-04-12: 12.5 mg via ORAL
  Filled 2012-04-11: qty 1

## 2012-04-11 MED ORDER — DEXMEDETOMIDINE HCL IN NACL 400 MCG/100ML IV SOLN
0.1000 ug/kg/h | INTRAVENOUS | Status: AC
  Administered 2012-04-12: .3 ug/kg/h via INTRAVENOUS
  Filled 2012-04-11: qty 100

## 2012-04-11 MED ORDER — BISACODYL 5 MG PO TBEC
5.0000 mg | DELAYED_RELEASE_TABLET | Freq: Once | ORAL | Status: AC
  Administered 2012-04-11: 5 mg via ORAL
  Filled 2012-04-11: qty 1

## 2012-04-11 MED ORDER — TRANEXAMIC ACID 100 MG/ML IV SOLN
1.5000 mg/kg/h | INTRAVENOUS | Status: DC
  Filled 2012-04-11: qty 25

## 2012-04-11 MED ORDER — EPINEPHRINE HCL 1 MG/ML IJ SOLN
0.5000 ug/min | INTRAVENOUS | Status: DC
  Filled 2012-04-11: qty 4

## 2012-04-11 MED ORDER — CHLORHEXIDINE GLUCONATE 4 % EX LIQD
60.0000 mL | Freq: Once | CUTANEOUS | Status: AC
  Administered 2012-04-12: 4 via TOPICAL
  Filled 2012-04-11 (×2): qty 60

## 2012-04-11 MED ORDER — MAGNESIUM SULFATE 50 % IJ SOLN
40.0000 meq | INTRAMUSCULAR | Status: DC
  Filled 2012-04-11: qty 10

## 2012-04-11 MED ORDER — VANCOMYCIN HCL 1000 MG IV SOLR
1250.0000 mg | INTRAVENOUS | Status: AC
  Administered 2012-04-12: 1250 mg via INTRAVENOUS
  Filled 2012-04-11: qty 1250

## 2012-04-11 MED ORDER — PHENYLEPHRINE HCL 10 MG/ML IJ SOLN
30.0000 ug/min | INTRAVENOUS | Status: AC
  Administered 2012-04-12: 25 ug/min via INTRAVENOUS
  Filled 2012-04-11: qty 2

## 2012-04-11 MED ORDER — SODIUM BICARBONATE 8.4 % IV SOLN
INTRAVENOUS | Status: DC
  Filled 2012-04-11: qty 2.5

## 2012-04-11 MED ORDER — TRANEXAMIC ACID (OHS) PUMP PRIME SOLUTION
2.0000 mg/kg | INTRAVENOUS | Status: DC
  Filled 2012-04-11: qty 1.58

## 2012-04-11 MED ORDER — POTASSIUM CHLORIDE 2 MEQ/ML IV SOLN
80.0000 meq | INTRAVENOUS | Status: DC
  Filled 2012-04-11: qty 40

## 2012-04-11 MED ORDER — DEXTROSE 5 % IV SOLN
750.0000 mg | INTRAVENOUS | Status: DC
  Filled 2012-04-11: qty 750

## 2012-04-11 MED ORDER — DEXTROSE 5 % IV SOLN
1.5000 g | INTRAVENOUS | Status: AC
  Administered 2012-04-12: 1.5 g via INTRAVENOUS
  Administered 2012-04-12: .75 g via INTRAVENOUS
  Filled 2012-04-11: qty 1.5

## 2012-04-11 MED ORDER — HEPARIN (PORCINE) IN NACL 100-0.45 UNIT/ML-% IJ SOLN
1150.0000 [IU]/h | INTRAMUSCULAR | Status: DC
  Administered 2012-04-11 (×2): 1150 [IU]/h via INTRAVENOUS
  Filled 2012-04-11: qty 250

## 2012-04-11 MED ORDER — NITROGLYCERIN IN D5W 200-5 MCG/ML-% IV SOLN
2.0000 ug/min | INTRAVENOUS | Status: DC
  Filled 2012-04-11: qty 250

## 2012-04-11 MED ORDER — DOPAMINE-DEXTROSE 3.2-5 MG/ML-% IV SOLN
2.0000 ug/kg/min | INTRAVENOUS | Status: DC
  Filled 2012-04-11: qty 250

## 2012-04-11 MED ORDER — POTASSIUM CHLORIDE CRYS ER 20 MEQ PO TBCR
40.0000 meq | EXTENDED_RELEASE_TABLET | Freq: Once | ORAL | Status: AC
  Administered 2012-04-11: 40 meq via ORAL
  Filled 2012-04-11: qty 2

## 2012-04-11 MED ORDER — SODIUM CHLORIDE 0.9 % IV SOLN
INTRAVENOUS | Status: AC
  Administered 2012-04-12: 1.1 [IU]/h via INTRAVENOUS
  Filled 2012-04-11: qty 1

## 2012-04-11 NOTE — Progress Notes (Signed)
Pt ambulated with rolling walker approx 500 feet. Pt was SOB during walk but able to hold a conversation for most of the walk. Towards the end of the walk the pt was much more SOB.

## 2012-04-11 NOTE — Progress Notes (Signed)
ANTICOAGULATION CONSULT NOTE - Follow-Up Consult  Pharmacy Consult for heparin Indication: atrial fibrillation/hx of arterial thrombus  Assessment: 71 yo with a hx of afib and arterial thrombus. She was admitted for aortic valve replacement on 7/15. She has been on Xarelto for anticoagulation for afib. Last dose was on 7/8, now bridging with IV heparin until surgery. Heparin level is slightly elevated at 0.73. No bleeding noted. CBC is stable  Goal of Therapy:  Heparin level 0.3-0.7 units/ml Monitor platelets by anticoagulation protocol: Yes   Plan:  1. Decrease heparin to 1150units/hr 2. Check an 8 hour heparin level  Lysle Pearl, PharmD, BCPS Pager # [phone] 04/11/2012 7:29 AM    Allergies  Allergen Reactions  . Latex Rash  . Neomycin-Bacitracin Zn-Polymyx Rash    Patient Measurements: Height: 5\' 2"  (157.5 cm) Weight: 174 lb 9.7 oz (79.2 kg) IBW/kg (Calculated) : 50.1  Heparin Dosing Weight: 68  Vital Signs: Temp: 97.6 F (36.4 C) (07/14 0440) Temp src: Oral (07/14 0440) BP: 141/75 mmHg (07/14 0440) Pulse Rate: 87  (07/14 0440)  Labs:  Basename 04/11/12 0540 04/10/12 1821 04/10/12 0610 04/09/12 0535  HGB 10.4* -- 10.5* --  HCT 32.5* -- 32.8* 33.3*  PLT 208 -- 204 208  APTT -- -- -- --  LABPROT -- -- -- --  INR -- -- -- --  HEPARINUNFRC 0.73* 0.48 0.24* --  CREATININE 1.14* -- -- 1.09  CKTOTAL -- -- -- --  CKMB -- -- -- --  TROPONINI -- -- -- --    Estimated Creatinine Clearance: 44.7 ml/min (by C-G formula based on Cr of 1.14).   Medical History: Past Medical History  Diagnosis Date  . History of ventricular tachycardia   . Peripheral edema   . Aortic insufficiency     Moderate 2012  . History of gout   . Exogenous obesity   . History of CHF (congestive heart failure)     EF 45% 2012, nonischemic (Normal coronaries 2002)  . 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 Hypertension   . 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 ICD (implantable cardiac defibrillator) in place 2012  . DOE (dyspnea on exertion)   . Stroke 2000  . Renal insufficiency   . Myocardial infarction 2000  . Anxiety   . Heart murmur   . Anemia   . Presence of IVC filter 2000

## 2012-04-11 NOTE — Consult Note (Signed)
[address]            [place] [address]          [phone]          [address] [place]            [place] [id number]          [phone]        [name] Medical Record #[id number] Date of Birth: [birth year]  Referring: No ref. provider found Primary Care: [name], [name], MD  Chief Complaint:    No chief complaint on file.   History of Present Illness:   Patient is 71 year old female with long history of aortic insufficiency. She required urgent vascular surgery by Dr. [name] several weeks ago because of acute arterial embolus embolus to her left lower leg. She had not previously been on anticoagulation despite her known atrial fibrillation because of a remote history of an intracerebral hemorrhage. 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 Patient has had a long history of known aortic insufficiency with compensated congestive heart failure. Her echocardiogram done showing a recent hospitalization showed moderate to severe aortic insufficiency, increasing dilatation of her aortic root, and poor LV function with an ejection fraction of 20%. She recent echo done in May 2013 at [facility] therefore showed a significant decline in her left ventricular function. With this in mind, the patient is inquiring about possible surgical correction of her acending ( 4 cm by echo, not ct) aortic dilatation and her significant aortic valve regurgitation. Also of note in her history is the fact that she has a defibrillator in place because of a prior history of cardiac arrest secondary to ventricular tachycardia.  The patient's overall physical ability to get around is very limited her daughter does all the grocery shopping she is unable to walk even a quarter of mile. She does have nocturnal dyspnea one to 2  pillow orthopnea. She's had chronic edema in both lower [place] 3 years. She's at least class 3-4 heart failure.  The patient returns today with her daughter to further discuss her options. Since seen 2 weeks ago she appears to be slightly more short of breath than she was even 2 weeks ago.  Current Activity/ Functional Status: Patient is not independent with mobility/ambulation, transfers, ADL's, IADL's. Patient spent a 2 weeks and nurse skilled nursing facility to recover from the left leg embolus, she's had general decline in her ability to get around especially up and down stairs her daughter yesterday was looking for other living arrangements for her.   Past Medical History  Diagnosis Date  . History of ventricular tachycardia   . Peripheral edema   . Aortic insufficiency     Moderate 2012  . History of gout   . Exogenous obesity   . History of CHF (congestive heart failure)     EF 45% 2012, nonischemic (Normal coronaries 2002)  . 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 Hypertension   . 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 ICD (implantable cardiac defibrillator) in place 2012  .  DOE (dyspnea on exertion)   . Stroke 2000  . Renal insufficiency   . Myocardial infarction 2000  . Anxiety   . Heart murmur   . Anemia   . Presence of IVC filter 2000    Past Surgical History  Procedure Date  . Cardiac defibrillator placement   . Cataract extraction   . Embolectomy 02/15/2012    Procedure: EMBOLECTOMY;  Surgeon: [name], MD;  Location: [facility] OR;  Service: Vascular;  Laterality: Left;  left popliteal embolectomy with drain placement  . Insert / replace / remove pacemaker   . Eye surgery 2012    removal of cataracts bilaterally  . Cardiac catheterization   . Tonsillectomy    patient has a vena caval filter in place put in in 2002, she has a card documenting this but is not sure why it was placed  Family  History  Problem Relation Age of Onset  . Hypertension Father   . Cancer Father   . Coronary artery disease Father 35    History   Social History  . Marital Status: Divorced    Spouse Name: N/A    Number of Children: N/A  . Years of Education: N/A   Occupational History  . Not on file.   Social History Main Topics  . Smoking status: Never Smoker   . Smokeless tobacco: Never Used  . Alcohol Use: No  . Drug Use: No  . Sexually Active: No   Other Topics Concern  . Not on file   Social History Narrative   Lives with daughter.      History  Smoking status  . Never Smoker   Smokeless tobacco  . Never Used    History  Alcohol Use No     Allergies  Allergen Reactions  . Latex Rash  . Neomycin-Bacitracin Zn-Polymyx Rash    Current Facility-Administered Medications  Medication Dose Route Frequency Provider Last Rate Last Dose  . 0.9 %  sodium chloride infusion  250 mL Intravenous PRN [name], MD 10 mL/hr at 04/11/12 0633 250 mL at 04/11/12 4098  . 0.9 %  sodium chloride infusion  250 mL Intravenous PRN [name], MD      . acetaminophen (TYLENOL) tablet 650 mg  650 mg Oral Q4H PRN [name], MD   650 mg at 04/09/12 1453  . allopurinol (ZYLOPRIM) tablet 100 mg  100 mg Oral Daily Jessica A Hope, PA-C   100 mg at 04/11/12 0953  . bisacodyl (DULCOLAX) EC tablet 5 mg  5 mg Oral Once Delight Ovens, MD      . carvedilol (COREG) tablet 3.125 mg  3.125 mg Oral BID WC [name], MD   3.125 mg at 04/11/12 0630  . chlorhexidine (HIBICLENS) 4 % liquid 4 application  60 mL Topical Once Delight Ovens, MD      . ferrous sulfate tablet 325 mg  325 mg Oral Q breakfast [name], MD   325 mg at 04/11/12 0626  . heparin ADULT infusion 100 units/mL (25000 units/250 mL)  1,150 Units/hr Intravenous Continuous Delight Ovens, MD 11.5 mL/hr at 04/11/12 0755 1,150 Units/hr at 04/11/12 0755  . losartan (COZAAR) tablet 25 mg  25 mg Oral BID [name], MD   25 mg at 04/11/12 0953  . metoprolol tartrate (LOPRESSOR) tablet 12.5 mg  12.5 mg Oral Once Delight Ovens, MD      . nitroGLYCERIN (NITROLINGUAL) 0.4 MG/SPRAY spray 1 spray  1 spray Sublingual  Q5 Min x 3 PRN [name], MD      . ondansetron [facility]) injection 4 mg  4 mg Intravenous Q6H PRN [name], MD      . potassium chloride SA (K-DUR,KLOR-CON) CR tablet 20 mEq  20 mEq Oral Daily [name], MD   20 mEq at 04/11/12 0953  . potassium chloride SA (K-DUR,KLOR-CON) CR tablet 40 mEq  40 mEq Oral Once Luis Abed, MD      . sodium chloride 0.9 % injection 3 mL  3 mL Intravenous Q12H [name], MD   3 mL at 04/10/12 2200  . sodium chloride 0.9 % injection 3 mL  3 mL Intravenous PRN [name], MD      . DISCONTD: furosemide (LASIX) tablet 40 mg  40 mg Oral BID [name], MD   40 mg at 04/11/12 0754  . DISCONTD: heparin ADULT infusion 100 units/mL (25000 units/250 mL)  1,200 Units/hr Intravenous Continuous [name], MD 12 mL/hr at 04/10/12 2123 1,200 Units/hr at 04/10/12 2123       Review of Systems:     Cardiac Review of Systems: Y or N  Chest Pain [  n  ]  Resting SOB [ n  ] Exertional SOB  Cove.Etienne  ]  Orthopnea Cove.Etienne  ]   Pedal Edema [ y  ]    Palpitations [ n ] Syncope  [n  ]   Presyncope [ y  ]  General Review of Systems: [Y] = yes [  ]=no Constitional: recent weight change [ n ]; anorexia [  n]; fatigue [ y ]; nausea [n  ]; night sweats [ y ]; fever [n  ]; or chills [ n ];                                                                                                                                          Dental: poor dentition[ n ]; Last Dentist visit:   Eye : blurred vision [  ]; diplopia [   ]; vision changes [  ];  Amaurosis fugax[  ]; Resp: cough [ n ];  wheezing[ n ];  hemoptysis[ n ]; shortness of breath[ y ]; paroxysmal nocturnal dyspnea[ y ]; dyspnea on exertion[ y ]; or orthopnea[ y ];  GI:  gallstones[  ],  vomiting[  ];  dysphagia[  ]; melena[  ];  hematochezia [  ]; heartburn[  ];   Hx of  Colonoscopy[  ]; GU: kidney stones [  ]; hematuria[  ];   dysuria [n  ];  nocturia[  ];  history of     obstruction [n  ];             Skin: rash, swelling[  ];, hair loss[  ];  peripheral edema[  ];  or itching[  ]; Musculosketetal: myalgias[  ];  joint swelling[y  ];  joint  erythema[  ];  joint pain[y  ];  back pain[y  ];  Heme/Lymph: bruising[severe  ];  bleeding[ n ];  anemia[ chronic ];  Neuro: TIA[  ];  headaches[  ];  stroke[  ];  vertigo[  ];  seizures[  ];   paresthesias[  ];  difficulty walking[  ];  Psych:depression[  ]; anxiety[  ];  Endocrine: diabetes[  ];  thyroid dysfunction[  ];  Immunizations: Flu [ ? ]; Pneumococcal[  ?];  Other:  Physical Exam: BP 141/75  Pulse 87  Temp 97.6 F (36.4 C) (Oral)  Resp 18  Ht 5\' 2"  (1.575 m)  Wt 174 lb 9.7 oz (79.2 kg)  BMI 31.94 kg/m2  SpO2 97%  General appearance: alert, cooperative, appears older than stated age, fatigued and moderately obese Neurologic: intact Heart: prominent apical impulse and diastolic murmur: holodiastolic 3/6, blowing throughout the precordium Lungs: clear to auscultation bilaterally and normal percussion bilaterally Abdomen: soft, non-tender; bowel sounds normal; no masses,  no organomegaly Extremities: edema Both lower extremities left greater than right, varicose veins noted and venous stasis dermatitis noted Wound: The incision in her left leg is intact and healing from her recent embolectomy She has no carotid bruits  Diagnostic Studies & Laboratory data:   Transthoracic Echocardiography  Patient: Jahliyah, Trice MR #: [id number] Study Date: 02/15/2012 Gender: F Age: 71 Height: 157.5cm Weight: 82.3kg BSA: 1.91m^2 Pt. Status: Room: 4704  PERFORMING [place] SONOGRAPHER Silvano Bilis, RCS ADMITTING Ranger, Vijaya ATTENDING Kathlen Mody Ola Spurr,  [place] cc:  ------------------------------------------------------------ LV EF: 20%  ------------------------------------------------------------ Indications: Aortic insufficiency 424.1. Atrial fibrillation - 427.31.  ------------------------------------------------------------ History: PMH: Evaluate for embolus. Dyspnea. Congestive heart failure. Risk factors: Hypertension. Dyslipidemia.  ------------------------------------------------------------ Study Conclusions  - Left ventricle: The cavity size was moderately dilated. Wall thickness was normal. Systolic function was severely reduced. The estimated ejection fraction was 20%. Severe diffuse hypokinesis with absent systolic thickening of the inferior myocardium. - Aortic valve: Moderate to severe regurgitation. - Ascending aorta: The ascending aorta was mildly to moderately dilated. Linear echo in the ascending aorta likely artifact, but dissection cannot be unequivocally excluded. - Mitral valve: Mild regurgitation. - Left atrium: The atrium was moderately to severely dilated. - Right atrium: The atrium was moderately dilated. - Atrial septum: No defect or patent foramen ovale was identified. - Pulmonary arteries: PA peak pressure: 52mm Hg (S). Impressions:  - Compared to the previous study performed 12/23/10, LV and aortic root size have increased; further impairment of LV systolic function, worsening AI. Transthoracic echocardiography. M-mode, complete 2D, spectral Doppler, and color Doppler. Height: Height: 157.5cm. Height: 62in. Weight: Weight: 82.3kg. Weight: 181.1lb. Body mass index: BMI: 33.2kg/m^2. Body surface area: BSA: 1.33m^2. Blood pressure: 154/49. Patient status: Inpatient. Location: Bedside.  ------------------------------------------------------------  ------------------------------------------------------------ Left ventricle: The cavity size was moderately dilated. Wall thickness was normal.  Systolic function was severely reduced. The estimated ejection fraction was 20%. Severe diffuse hypokinesis with absent systolic thickening of the inferior myocardium.  ------------------------------------------------------------ Aortic valve: Trileaflet; mildly thickened leaflets. Doppler: Transvalvular velocity was increased. There was no stenosis. Moderate to severe regurgitation. Peak velocity ratio of LVOT to aortic valve: 0.78. Mean gradient: 11mm Hg (S). Peak gradient: 22mm Hg (S).  ------------------------------------------------------------ Aorta: Aortic root: The aortic root was mildly dilated. Ascending aorta: The ascending aorta was mildly to moderately dilated. Linear echo in the ascending aorta likely artifact, but dissection cannot be unequivocally excluded. Descending aorta: The descending aorta was normal in size. No atheroma apparent.  ------------------------------------------------------------  Mitral valve: Mildly thickened leaflets . Leaflet separation was normal. Doppler: Transvalvular velocity was within the normal range. There was no evidence for stenosis. Mild regurgitation. Mean gradient: 5mm Hg (D). Peak gradient: 12mm Hg (D).  ------------------------------------------------------------ Left atrium: The atrium was moderately to severely dilated.  ------------------------------------------------------------ Atrial septum: No defect or patent foramen ovale was identified.  ------------------------------------------------------------ Right ventricle: The cavity size was normal. Wall thickness was normal. Pacer wire or catheter noted in right ventricle. Systolic function was normal.  ------------------------------------------------------------ Pulmonic valve: Structurally normal valve. Cusp separation was normal. Doppler: Transvalvular velocity was within the normal range. No  regurgitation.  ------------------------------------------------------------ Tricuspid valve: Structurally normal valve. Leaflet separation was normal. Doppler: Transvalvular velocity was within the normal range. Trivial regurgitation.  ------------------------------------------------------------ Pulmonary artery: The main pulmonary artery was normal-sized. Systolic pressure could not be accurately estimated.  ------------------------------------------------------------ Right atrium: The atrium was moderately dilated.  ------------------------------------------------------------ Pericardium: There was no pericardial effusion.  ------------------------------------------------------------ Systemic veins: Inferior vena cava: The vessel was mildly dilated; the respirophasic diameter changes were in the normal range (= 50%).  ------------------------------------------------------------  2D measurements Normal Doppler measurements Normal Left ventricle Main pulmonary LVID ED, 68.9 mm 43-52 artery chord, Pressure, S 52 mm =30 PLAX Hg LVID ES, 57.9 mm 23-38 LVOT chord, Peak vel, S 184 cm/s ------ PLAX Peak 14 mm ------ FS, chord, 16 % >29 gradient, S Hg PLAX Aortic valve LVPW, ED 10 mm ------ Peak vel, S 236 cm/s ------ IVS/LVPW 0.91 <1.3 Mean vel, S 153 cm/s ------ ratio, ED VTI, S 41.7 cm ------ Ventricular septum Mean 11 mm ------ IVS, ED 9.05 mm ------ gradient, S Hg Aortic valve Peak 22 mm ------ Leaflet 16 mm 15-26 gradient, S Hg separation Peak vel 0.78 ------ Aorta ratio, Root diam, 40 mm ------ LVOT/AV ED Regurg vel, 308 cm/s ------ Left atrium ED AP dim 36 mm ------ Regurg PHT 290 ms ------ AP dim 1.86 cm/m^2 <2.2 Regurg 38 mm ------ index gradient, ED Hg Mitral valve M-mode measurements Normal Peak E vel 153 cm/s ------ Aortic valve Mean vel, D 99.7 cm/s ------ Leaflet 16 mm 15-26 Mean 5 mm ------ separation gradient, D Hg Peak 12 mm ------ gradient, D  Hg Annulus VTI 29 cm ------ Tricuspid valve Regurg peak 303 cm/s ------ vel Peak RV-RA 37 mm ------ gradient, S Hg Systemic veins Estimated 15 mm ------ CVP Hg Right ventricle Pressure, S 52 mm <30 Hg Sa vel, lat 10.4 cm/s ------ ann, tiss DP  ------------------------------------------------------------ Prepared and Electronically Authenticated by  [place] Bing, MD 2013-05-19T13:51:[id number]   Cath Report:   RCardiac Catheterization Procedure Note  Name: [name]  MRN: [id number]  DOB: 02/12/1941  Procedure: Right Heart Cath, Left Heart Cath, Selective Coronary Angiography, aortic root aortogram  Indication: Severe aortic insufficiency with heart failure, pre-cardiac surgery evaluation.  Procedural Details: The right groin was prepped, draped, and anesthetized with 1% lidocaine. Using the modified Seldinger technique a 5 French sheath was placed in the right femoral artery and a 6 French sheath was placed in the right femoral vein. A 6 French multipurpose catheter was used for the right heart catheterization. The patient has an IVC filter and the catheter was guided under fluoroscopy so that there was no trauma to the filter. Standard protocol was followed for recording of right heart pressures and sampling of oxygen saturations. Fick cardiac output was calculated. Standard Judkins catheters were used for selective coronary angiography and aortic root angiography. For the left coronary artery, a JL 5 catheter  was used. There were no immediate procedural complications. The patient was transferred to the post catheterization recovery area for further monitoring.  Procedural Findings:  Hemodynamics  RA 17  RV 68/14  PA 69/31 with a mean of 48  PCWP 28  LV 165/29  AO 171/69 with a mean of 108  Oxygen saturations:  PA 60%  AO 98%  Cardiac Output (Fick) 4.3 L per  Cardiac Index (Fick) 2.4 L per minute per meters  Coronary angiography:  Coronary dominance: right  Left  mainstem: The left mainstem is patent. There is no obstructive disease.  Left anterior descending (LAD): The LAD is patent to the left ventricular apex. The vessel wraps around the apex. There are minor irregularities but no significant stenoses throughout. The first diagonal is a large caliber vessel without significant disease  Left circumflex (LCx): The left circumflex is patent. There is a large first OM with no significant disease. The proximal circumflex has 30% stenosis.  Right coronary artery (RCA): The RCA is dominant. This is a large vessels. It is smooth throughout its course. There is no obstructive disease present. He gives off a PDA and 2 posterolateral branches without significant disease.  Aortic root angiography: There is severe aortic insufficiency. The left ventricle is opacified and appears to have severe diffuse hypokinesis.  Final Conclusions:  1. Mild nonobstructive diffuse coronary artery disease.  2. Severe aortic insufficiency with known severe LV dysfunction  3. Elevated left heart and right heart pressures with severe pulmonary hypertension and elevated left ventricular filling pressure.  the patient is pending cardiac surgery early next week. considering her atrial fibrillation and history of arterial embolus within the past 6 months. She will be started on intravenous unfractionated heparin  Tonny Bollman  Recent Radiology Findings:   Ct Angio Chest W/cm &/or Wo Cm  04/09/2012  *RADIOLOGY REPORT*  Clinical Data: Preop.  Severe aortic insufficiency.  CT ANGIOGRAPHY CHEST  Technique:  Multidetector CT imaging of the chest using the standard protocol during bolus administration of intravenous contrast. Multiplanar reconstructed images including MIPs were obtained and reviewed to evaluate the vascular anatomy.  Contrast: OMNIPAQUE IOHEXOL 350 MG/ML SOLN  Comparison: None.  Findings: The present examination was performed as a pulmonary embolus protocol and not during  aortic phase imaging limiting evaluation.  Taking this limitation into account, the ascending thoracic aorta 4.6 x 5 cm. Calcified aortic arch, descending thoracic aorta, upper abdominal aorta and origin of the great vessels.  Cardiomegaly.  Left ventricular hypertrophy may be present.  AICD is in place with the tip in the region of the right atrium. Coronary artery calcifications.  No pulmonary embolus.  There may be increased right heart pressure.  Small pleural effusions.  Septal thickening.  This may reflect pulmonary vascular congestion/mild congestive heart failure.  Subcarinal adenopathy measuring up to 2.9 x 2.2 cm transverse dimension.  Etiology indeterminate.  Although this may be reactive origin, this will require follow-up.  Additionally, left base lobulated 1.4 x 1.3 x 0.7 cm nodule.  Right renal 4 cm cyst.  Degenerative changes thoracic spine.  IMPRESSION: Ascending thoracic aorta measures 4.6 x 5 cm maximal transverse dimension.  Atherosclerotic type changes remainder of visualized arterial structures as noted above.  Cardiomegaly.  AICD is in place.  No pulmonary embolus.  Subcarinal adenopathy measuring up to 2.9 x 2.2 cm transverse dimension.  Additionally, left base lobulated 1.4 x 1.3 x 0.7 cm nodule. This will require follow-up.  This can be addressed on the  patient's postoperative exam or CT chest in 3 months.  Examination with reviewed with Dr. [name]  04/09/2038 7:45 p.m. Repeat imaging will not be performed preoperatively after this reviewer as dissection is not of concern and adequate measurements of the ascending thoracic aorta were obtained on the present exam.  Original Report Authenticated By: Fuller Canada, M.D.    She has no CT findings to evaluate the size of her aorta  Recent Lab Findings: Lab Results  Component Value Date   WBC 6.4 04/11/2012   HGB 10.4* 04/11/2012   HCT 32.5* 04/11/2012   PLT 208 04/11/2012   GLUCOSE 90 04/11/2012   CHOL 195 03/30/2012   TRIG 82.0  03/30/2012   HDL 57.30 03/30/2012   LDLDIRECT 152.6 09/01/2011   LDLCALC 121* 03/30/2012   ALT 12 04/06/2012   AST 23 04/06/2012   NA 141 04/11/2012   K 3.5 04/11/2012   CL 103 04/11/2012   CREATININE 1.14* 04/11/2012   BUN 15 04/11/2012   CO2 26 04/11/2012   TSH 1.786 04/06/2012   INR 1.10 04/08/2012      Assessment / Plan:    #1 moderate to severe aortic insufficiency with 20% ejection fraction left ventricular end-diastolic dimension of 69 mm 4 cm descending aorta by echo #2 inferior severe diffuse hypokinesis with absent systolic thickening of the inferior myocardium, coronary artery status per cath report #3 atrial fibrillation with recent left leg arterial embolus #4 generalized deconditioning #5 history of stroke in the past affecting the right side #6 limited renal reserve, stage III renal insufficiency  Patient with severe aortic insufficiency which is now with a end-diastolic dimension of 69 mm up from 61 approximately one year ago and a 20% ejection fraction with class IV congestive heart failure makes the patient in a very high risk category for aortic valve replacement, the degree of LV function return maybe limited. These findings do not preclude aortic valve replacement but does limit what benefit she may gain from it. I discussed the risks and options of surgery with the patient and her daughter. She would like to proceeded with  Surgery. Will proceed with surgery on July 15 Monday, replacing her aortic valve, poss ascending aorta and atrial clip.The goals risks and alternatives of the planned surgical procedure  AVR, poss ascending  aortic replacement atrial clip have been discussed with the patient in detail. The risks of the procedure including death, infection, stroke, myocardial infarction, bleeding, blood transfusion , heart block and need for pacer have all been discussed specifically.  I have quoted [employer] a 25 % of perioperative mortality and a complication rate as high as   60 %. The patient's questions have been answered.Earlyne R Gatti is willing  to proceed with the planned procedure    Delight Ovens MD  Beeper [phone] Office [phone]

## 2012-04-11 NOTE — Progress Notes (Signed)
ANTICOAGULATION CONSULT NOTE - Follow-Up Consult  Pharmacy Consult for heparin Indication: atrial fibrillation/hx of arterial thrombus  Assessment: 71 yo with a hx of afib and arterial thrombus. She was admitted for aortic valve replacement on 7/15. She has been on Xarelto for anticoagulation for afib. Last dose was on 7/8, now bridging with IV heparin until surgery. Heparin level is at goal at 0.54. No bleeding noted. CBC is stable. Plan for surgery tomorrow and heparin will be turned off at 0600.  Goal of Therapy:  Heparin level 0.3-0.7 units/ml Monitor platelets by anticoagulation protocol: Yes   Plan:  1. Continue heparin at 1150units/hr 2. F/u AM heparin level  Lysle Pearl, PharmD, BCPS Pager # [phone] 04/11/2012 5:30 PM    Allergies  Allergen Reactions  . Latex Rash  . Neomycin-Bacitracin Zn-Polymyx Rash    Patient Measurements: Height: 5\' 2"  (157.5 cm) Weight: 174 lb 9.7 oz (79.2 kg) IBW/kg (Calculated) : 50.1  Heparin Dosing Weight: 68  Vital Signs: Temp: 97.8 F (36.6 C) (07/14 1500) Temp src: Oral (07/14 1500) BP: 138/79 mmHg (07/14 1500) Pulse Rate: 88  (07/14 1500)  Labs:  Basename 04/11/12 1625 04/11/12 0540 04/10/12 1821 04/10/12 0610 04/09/12 0535  HGB -- 10.4* -- 10.5* --  HCT -- 32.5* -- 32.8* 33.3*  PLT -- 208 -- 204 208  APTT -- -- -- -- --  LABPROT -- -- -- -- --  INR -- -- -- -- --  HEPARINUNFRC 0.54 0.73* 0.48 -- --  CREATININE -- 1.14* -- -- 1.09  CKTOTAL -- -- -- -- --  CKMB -- -- -- -- --  TROPONINI -- -- -- -- --    Estimated Creatinine Clearance: 44.7 ml/min (by C-G formula based on Cr of 1.14).   Medical History: Past Medical History  Diagnosis Date  . History of ventricular tachycardia   . Peripheral edema   . Aortic insufficiency     Moderate 2012  . History of gout   . Exogenous obesity   . History of CHF (congestive heart failure)     EF 45% 2012, nonischemic (Normal coronaries 2002)  . Automatic implantable  cardiac defibrillator single-chamber-Medtronic 11/04/2011  . Hypertension   . 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 ICD (implantable cardiac defibrillator) in place 2012  . DOE (dyspnea on exertion)   . Stroke 2000  . Renal insufficiency   . Myocardial infarction 2000  . Anxiety   . Heart murmur   . Anemia   . Presence of IVC filter 2000

## 2012-04-11 NOTE — Progress Notes (Signed)
Patient ID: [name], female   DOB: [birth year], 72 y.o.   MRN: [id number]   SUBJECTIVE:  Patient feels good this morning. She did not sleep well because she was up urinating. However she's had a good diuretic response and she is lying flat in bed with no shortness of breath today. Renal function is stable.   Filed Vitals:   04/10/12 1425 04/10/12 2029 04/11/12 0440 04/11/12 0629  BP:  137/76 141/75   Pulse: 84 89 87   Temp: 97.6 F (36.4 C) 97.5 F (36.4 C) 97.6 F (36.4 C)   TempSrc: Oral Oral Oral   Resp: 16 18 18    Height:      Weight:    174 lb 9.7 oz (79.2 kg)  SpO2: 96% 96% 97%     Intake/Output Summary (Last 24 hours) at 04/11/12 0808 Last data filed at 04/11/12 0314  Gross per 24 hour  Intake 607.73 ml  Output   1350 ml  Net -742.27 ml    LABS: Basic Metabolic Panel:  Basename 04/11/12 0540 04/09/12 0535  NA 141 140  K 3.5 4.2  CL 103 107  CO2 26 22  GLUCOSE 90 91  BUN 15 17  CREATININE 1.14* 1.09  CALCIUM 9.0 9.2  MG -- --  PHOS -- --   Liver Function Tests: No results found for this basename: AST:2,ALT:2,ALKPHOS:2,BILITOT:2,PROT:2,ALBUMIN:2 in the last 72 hours No results found for this basename: LIPASE:2,AMYLASE:2 in the last 72 hours CBC:  Basename 04/11/12 0540 04/10/12 0610  WBC 6.4 6.4  NEUTROABS 3.0 3.4  HGB 10.4* 10.5*  HCT 32.5* 32.8*  MCV 94.8 96.2  PLT 208 204   Cardiac Enzymes: No results found for this basename: CKTOTAL:3,CKMB:3,CKMBINDEX:3,TROPONINI:3 in the last 72 hours BNP: No components found with this basename: POCBNP:3 D-Dimer: No results found for this basename: DDIMER:2 in the last 72 hours Hemoglobin A1C: No results found for this basename: HGBA1C in the last 72 hours Fasting Lipid Panel: No results found for this basename: CHOL,HDL,LDLCALC,TRIG,CHOLHDL,LDLDIRECT in the last 72 hours Thyroid Function Tests: No results found for this basename: TSH,T4TOTAL,FREET3,T3FREE,THYROIDAB in the last 72  hours  RADIOLOGY: Dg Chest 2 View  04/07/2012  *RADIOLOGY REPORT*  Clinical Data: Shortness of breath.  CHEST - 2 VIEW  Comparison: Chest radiograph performed 02/15/2012  Findings: The lungs are well-aerated.  Persistent vascular congestion is noted.  There is no evidence of focal opacification, pleural effusion or pneumothorax.  The heart is mildly enlarged.  An AICD is noted at the left chest wall, with a single lead ending at the right ventricle.  No acute osseous abnormalities are seen.  IMPRESSION: Persistent vascular congestion and mild cardiomegaly noted; no acute cardiopulmonary process seen.  Original Report Authenticated By: Tonia Ghent, M.D.   Ct Angio Chest W/cm &/or Wo Cm  04/09/2012  *RADIOLOGY REPORT*  Clinical Data: Preop.  Severe aortic insufficiency.  CT ANGIOGRAPHY CHEST  Technique:  Multidetector CT imaging of the chest using the standard protocol during bolus administration of intravenous contrast. Multiplanar reconstructed images including MIPs were obtained and reviewed to evaluate the vascular anatomy.  Contrast: OMNIPAQUE IOHEXOL 350 MG/ML SOLN  Comparison: None.  Findings: The present examination was performed as a pulmonary embolus protocol and not during aortic phase imaging limiting evaluation.  Taking this limitation into account, the ascending thoracic aorta 4.6 x 5 cm. Calcified aortic arch, descending thoracic aorta, upper abdominal aorta and origin of the great vessels.  Cardiomegaly.  Left ventricular hypertrophy may be present.  AICD is in place with the tip in the region of the right atrium. Coronary artery calcifications.  No pulmonary embolus.  There may be increased right heart pressure.  Small pleural effusions.  Septal thickening.  This may reflect pulmonary vascular congestion/mild congestive heart failure.  Subcarinal adenopathy measuring up to 2.9 x 2.2 cm transverse dimension.  Etiology indeterminate.  Although this may be reactive origin, this will  require follow-up.  Additionally, left base lobulated 1.4 x 1.3 x 0.7 cm nodule.  Right renal 4 cm cyst.  Degenerative changes thoracic spine.  IMPRESSION: Ascending thoracic aorta measures 4.6 x 5 cm maximal transverse dimension.  Atherosclerotic type changes remainder of visualized arterial structures as noted above.  Cardiomegaly.  AICD is in place.  No pulmonary embolus.  Subcarinal adenopathy measuring up to 2.9 x 2.2 cm transverse dimension.  Additionally, left base lobulated 1.4 x 1.3 x 0.7 cm nodule. This will require follow-up.  This can be addressed on the patient's postoperative exam or CT chest in 3 months.  Examination with reviewed with Dr. [name]  04/09/2038 7:45 p.m. Repeat imaging will not be performed preoperatively after this reviewer as dissection is not of concern and adequate measurements of the ascending thoracic aorta were obtained on the present exam.  Original Report Authenticated By: Fuller Canada, M.D.    PHYSICAL EXAM  Patient is oriented to person time and place. Affect is normal. There is no jugulovenous distention. Lungs reveal a few scattered rhonchi. Cardiac exam reveals her murmur of AS and AI. Abdomen is soft. There is no peripheral edema.   TELEMETRY:  I have reviewed telemetry today April 11, 2012. There is atrial fibrillation with a controlled rate. There is one episode of 7 beats with increased heart rate. The QRS is the same as baseline in one of the 2 leads.   ASSESSMENT AND PLAN:   Atrial fibrillation     Atrial fibrillation controlled. No change in therapy.   Aortic insufficiency and aortic stenosis     Patient is scheduled for surgery tomorrow.   Acute on chronic systolic congestive heart failure, NYHA class 3     Patient is diaries nicely. Her renal function is stable. I have chosen to stop her diuretic today to be sure that she does not become volume depleted going into surgery.  Hypokalemia    Potassium is down to 3.5. This will be  treated.  Willa Rough 04/11/2012 8:08 AM

## 2012-04-12 ENCOUNTER — Encounter (HOSPITAL_COMMUNITY)
Admission: AD | Disposition: A | Discharge status: Home / Self Care | Payer: Self-pay | Source: Ambulatory Visit | Attending: Cardiothoracic Surgery

## 2012-04-12 ENCOUNTER — Inpatient Hospital Stay (HOSPITAL_COMMUNITY): Payer: Medicare Other

## 2012-04-12 ENCOUNTER — Encounter (HOSPITAL_COMMUNITY): Payer: Self-pay | Admitting: Anesthesiology

## 2012-04-12 ENCOUNTER — Inpatient Hospital Stay (HOSPITAL_COMMUNITY): Payer: Medicare Other | Admitting: Anesthesiology

## 2012-04-12 DIAGNOSIS — I712 Thoracic aortic aneurysm, without rupture, unspecified: Secondary | ICD-10-CM

## 2012-04-12 DIAGNOSIS — I359 Nonrheumatic aortic valve disorder, unspecified: Secondary | ICD-10-CM

## 2012-04-12 HISTORY — PX: AORTIC VALVE REPLACEMENT: SHX41

## 2012-04-12 HISTORY — PX: THORACIC AORTIC ANEURYSM REPAIR: SHX799

## 2012-04-12 LAB — BASIC METABOLIC PANEL
BUN: 16 mg/dL (ref 6–23)
CO2: 23 mEq/L (ref 19–32)
Calcium: 9.2 mg/dL (ref 8.4–10.5)
Chloride: 104 mEq/L (ref 96–112)
Creatinine, Ser: 1.23 mg/dL — ABNORMAL HIGH (ref 0.50–1.10)
GFR calc Af Amer: 50 mL/min — ABNORMAL LOW (ref >90)
GFR calc non Af Amer: 43 mL/min — ABNORMAL LOW (ref >90)
Glucose, Bld: 89 mg/dL (ref 70–99)
Potassium: 3.7 mEq/L (ref 3.5–5.1)
Sodium: 140 mEq/L (ref 135–145)

## 2012-04-12 LAB — URINALYSIS, ROUTINE W REFLEX MICROSCOPIC
Bilirubin Urine: NEGATIVE
Glucose, UA: NEGATIVE mg/dL
Hgb urine dipstick: NEGATIVE
Ketones, ur: NEGATIVE mg/dL
Protein, ur: NEGATIVE mg/dL

## 2012-04-12 LAB — CBC
HCT: 34.2 % — ABNORMAL LOW (ref 36.0–46.0)
HCT: 29.7 % — ABNORMAL LOW (ref 36.0–46.0)
Hemoglobin: 10.9 g/dL — ABNORMAL LOW (ref 12.0–15.0)
Hemoglobin: 9.8 g/dL — ABNORMAL LOW (ref 12.0–15.0)
MCH: 30.7 pg (ref 26.0–34.0)
MCH: 31.3 pg (ref 26.0–34.0)
MCHC: 31.9 g/dL (ref 30.0–36.0)
MCHC: 33 g/dL (ref 30.0–36.0)
MCV: 96.3 fL (ref 78.0–100.0)
MCV: 94.9 fL (ref 78.0–100.0)
Platelets: 211 10*3/uL (ref 150–400)
Platelets: 108 10*3/uL — ABNORMAL LOW (ref 150–400)
Platelets: 105 10*3/uL — ABNORMAL LOW (ref 150–400)
RBC: 3.55 MIL/uL — ABNORMAL LOW (ref 3.87–5.11)
RBC: 3.13 MIL/uL — ABNORMAL LOW (ref 3.87–5.11)
RDW: 16 % — ABNORMAL HIGH (ref 11.5–15.5)
RDW: 15.8 % — ABNORMAL HIGH (ref 11.5–15.5)
RDW: 15.9 % — ABNORMAL HIGH (ref 11.5–15.5)
WBC: 6.2 10*3/uL (ref 4.0–10.5)
WBC: 16.9 10*3/uL — ABNORMAL HIGH (ref 4.0–10.5)
WBC: 16 10*3/uL — ABNORMAL HIGH (ref 4.0–10.5)

## 2012-04-12 LAB — HEMOGLOBIN AND HEMATOCRIT, BLOOD
HCT: 24.3 % — ABNORMAL LOW (ref 36.0–46.0)
Hemoglobin: 8 g/dL — ABNORMAL LOW (ref 12.0–15.0)

## 2012-04-12 LAB — POCT I-STAT 4, (NA,K, GLUC, HGB,HCT)
Glucose, Bld: 85 mg/dL (ref 70–99)
Glucose, Bld: 96 mg/dL (ref 70–99)
Glucose, Bld: 126 mg/dL — ABNORMAL HIGH (ref 70–99)
HCT: 20 % — ABNORMAL LOW (ref 36.0–46.0)
HCT: 28 % — ABNORMAL LOW (ref 36.0–46.0)
HCT: 25 % — ABNORMAL LOW (ref 36.0–46.0)
HCT: 26 % — ABNORMAL LOW (ref 36.0–46.0)
HCT: 34 % — ABNORMAL LOW (ref 36.0–46.0)
Hemoglobin: 6.8 g/dL — CL (ref 12.0–15.0)
Hemoglobin: 9.5 g/dL — ABNORMAL LOW (ref 12.0–15.0)
Hemoglobin: 8.5 g/dL — ABNORMAL LOW (ref 12.0–15.0)
Hemoglobin: 8.8 g/dL — ABNORMAL LOW (ref 12.0–15.0)
Hemoglobin: 11.6 g/dL — ABNORMAL LOW (ref 12.0–15.0)
Potassium: 3.5 mEq/L (ref 3.5–5.1)
Potassium: 3.3 mEq/L — ABNORMAL LOW (ref 3.5–5.1)
Potassium: 3.9 mEq/L (ref 3.5–5.1)
Potassium: 3.7 mEq/L (ref 3.5–5.1)
Sodium: 141 mEq/L (ref 135–145)
Sodium: 138 mEq/L (ref 135–145)
Sodium: 140 mEq/L (ref 135–145)

## 2012-04-12 LAB — POCT I-STAT 3, ART BLOOD GAS (G3+)
Acid-Base Excess: 2 mmol/L (ref 0.0–2.0)
Acid-base deficit: 4 mmol/L — ABNORMAL HIGH (ref 0.0–2.0)
Bicarbonate: 25.1 mEq/L — ABNORMAL HIGH (ref 20.0–24.0)
O2 Saturation: 100 %
O2 Saturation: 98 %
Patient temperature: 34.4
TCO2: 26 mmol/L (ref 0–100)
pCO2 arterial: 33.4 mmHg — ABNORMAL LOW (ref 35.0–45.0)
pCO2 arterial: 41.7 mmHg (ref 35.0–45.0)
pH, Arterial: 7.483 — ABNORMAL HIGH (ref 7.350–7.450)
pO2, Arterial: 242 mmHg — ABNORMAL HIGH (ref 80.0–100.0)
pO2, Arterial: 262 mmHg — ABNORMAL HIGH (ref 80.0–100.0)

## 2012-04-12 LAB — URINE MICROSCOPIC-ADD ON

## 2012-04-12 LAB — CREATININE, SERUM
Creatinine, Ser: 1.01 mg/dL (ref 0.50–1.10)
GFR calc Af Amer: 64 mL/min — ABNORMAL LOW (ref >90)
GFR calc non Af Amer: 55 mL/min — ABNORMAL LOW (ref >90)

## 2012-04-12 LAB — PROTIME-INR
INR: 1.78 — ABNORMAL HIGH (ref 0.00–1.49)
Prothrombin Time: 21 seconds — ABNORMAL HIGH (ref 11.6–15.2)

## 2012-04-12 LAB — POCT I-STAT, CHEM 8
BUN: 12 mg/dL (ref 6–23)
Chloride: 105 mEq/L (ref 96–112)
Sodium: 138 mEq/L (ref 135–145)

## 2012-04-12 LAB — APTT: aPTT: 45 seconds — ABNORMAL HIGH (ref 24–37)

## 2012-04-12 LAB — MAGNESIUM: Magnesium: 3.2 mg/dL — ABNORMAL HIGH (ref 1.5–2.5)

## 2012-04-12 LAB — PLATELET COUNT: Platelets: 106 10*3/uL — ABNORMAL LOW (ref 150–400)

## 2012-04-12 SURGERY — REPLACEMENT, AORTIC VALVE, OPEN
Anesthesia: General | Site: Chest

## 2012-04-12 MED ORDER — AMIODARONE HCL IN DEXTROSE 360-4.14 MG/200ML-% IV SOLN
INTRAVENOUS | Status: DC | PRN
  Administered 2012-04-12: 60 mg/h via INTRAVENOUS

## 2012-04-12 MED ORDER — HEPARIN SODIUM (PORCINE) 1000 UNIT/ML IJ SOLN
INTRAMUSCULAR | Status: AC
  Filled 2012-04-12: qty 1

## 2012-04-12 MED ORDER — HEPARIN SODIUM (PORCINE) 1000 UNIT/ML IJ SOLN
INTRAMUSCULAR | Status: DC | PRN
  Administered 2012-04-12: 29000 [IU] via INTRAVENOUS

## 2012-04-12 MED ORDER — POTASSIUM CHLORIDE 10 MEQ/50ML IV SOLN
10.0000 meq | INTRAVENOUS | Status: AC
  Administered 2012-04-12 (×3): 10 meq via INTRAVENOUS

## 2012-04-12 MED ORDER — SODIUM CHLORIDE 0.9 % IJ SOLN
3.0000 mL | Freq: Two times a day (BID) | INTRAMUSCULAR | Status: DC
  Administered 2012-04-13 – 2012-04-16 (×4): 3 mL via INTRAVENOUS

## 2012-04-12 MED ORDER — ONDANSETRON HCL 4 MG/2ML IJ SOLN: 4.0000 mg | Freq: Four times a day (QID) | INTRAMUSCULAR | Status: DC | PRN

## 2012-04-12 MED ORDER — MORPHINE SULFATE 2 MG/ML IJ SOLN
2.0000 mg | INTRAMUSCULAR | Status: DC | PRN
  Administered 2012-04-13: 2 mg via INTRAVENOUS
  Filled 2012-04-12: qty 1

## 2012-04-12 MED ORDER — ACETAMINOPHEN 160 MG/5ML PO SOLN
975.0000 mg | Freq: Four times a day (QID) | ORAL | Status: AC
  Administered 2012-04-12: 975 mg

## 2012-04-12 MED ORDER — BISACODYL 5 MG PO TBEC
10.0000 mg | DELAYED_RELEASE_TABLET | Freq: Every day | ORAL | Status: DC
  Administered 2012-04-13 – 2012-04-15 (×3): 10 mg via ORAL
  Filled 2012-04-12 (×3): qty 2

## 2012-04-12 MED ORDER — HEMOSTATIC AGENTS (NO CHARGE) OPTIME
TOPICAL | Status: DC | PRN
  Administered 2012-04-12: 1 via TOPICAL

## 2012-04-12 MED ORDER — PANTOPRAZOLE SODIUM 40 MG PO TBEC
40.0000 mg | DELAYED_RELEASE_TABLET | Freq: Every day | ORAL | Status: DC
  Administered 2012-04-14 – 2012-04-21 (×8): 40 mg via ORAL
  Filled 2012-04-12 (×8): qty 1

## 2012-04-12 MED ORDER — DOPAMINE-DEXTROSE 3.2-5 MG/ML-% IV SOLN
2.5000 ug/kg/min | INTRAVENOUS | Status: DC
  Administered 2012-04-13: 5 ug/kg/min via INTRAVENOUS
  Filled 2012-04-12: qty 250

## 2012-04-12 MED ORDER — SODIUM CHLORIDE 0.9 % IJ SOLN
OROMUCOSAL | Status: DC | PRN
  Administered 2012-04-12: 10:00:00 via TOPICAL

## 2012-04-12 MED ORDER — SODIUM CHLORIDE 0.9 % IV SOLN
INTRAVENOUS | Status: DC | PRN
  Administered 2012-04-12: 13:00:00 via INTRAVENOUS

## 2012-04-12 MED ORDER — ETOMIDATE 2 MG/ML IV SOLN
INTRAVENOUS | Status: DC | PRN
  Administered 2012-04-12: 10 mg via INTRAVENOUS

## 2012-04-12 MED ORDER — 0.9 % SODIUM CHLORIDE (POUR BTL) OPTIME
TOPICAL | Status: DC | PRN
  Administered 2012-04-12: 6000 mL

## 2012-04-12 MED ORDER — NITROGLYCERIN IN D5W 200-5 MCG/ML-% IV SOLN
INTRAVENOUS | Status: DC | PRN
  Administered 2012-04-12: 5 ug/min via INTRAVENOUS

## 2012-04-12 MED ORDER — ROCURONIUM BROMIDE 100 MG/10ML IV SOLN
INTRAVENOUS | Status: DC | PRN
  Administered 2012-04-12: 50 mg via INTRAVENOUS

## 2012-04-12 MED ORDER — ASPIRIN 81 MG PO CHEW: 324.0000 mg | CHEWABLE_TABLET | Freq: Every day | ORAL | Status: DC

## 2012-04-12 MED ORDER — VANCOMYCIN HCL IN DEXTROSE 1-5 GM/200ML-% IV SOLN
1000.0000 mg | Freq: Once | INTRAVENOUS | Status: AC
  Administered 2012-04-12: 1000 mg via INTRAVENOUS
  Filled 2012-04-12: qty 200

## 2012-04-12 MED ORDER — INSULIN REGULAR BOLUS VIA INFUSION
0.0000 [IU] | Freq: Three times a day (TID) | INTRAVENOUS | Status: DC
  Filled 2012-04-12: qty 10

## 2012-04-12 MED ORDER — FAMOTIDINE IN NACL 20-0.9 MG/50ML-% IV SOLN
20.0000 mg | Freq: Two times a day (BID) | INTRAVENOUS | Status: AC
  Administered 2012-04-12: 20 mg via INTRAVENOUS

## 2012-04-12 MED ORDER — ACETAMINOPHEN 650 MG RE SUPP
650.0000 mg | RECTAL | Status: AC
  Administered 2012-04-12: 650 mg via RECTAL

## 2012-04-12 MED ORDER — SODIUM CHLORIDE 0.9 % IV SOLN
INTRAVENOUS | Status: DC
  Administered 2012-04-12: 2.5 [IU]/h via INTRAVENOUS
  Administered 2012-04-12: 0.8 [IU]/h via INTRAVENOUS
  Filled 2012-04-12: qty 1

## 2012-04-12 MED ORDER — DOCUSATE SODIUM 100 MG PO CAPS
200.0000 mg | ORAL_CAPSULE | Freq: Every day | ORAL | Status: DC
  Administered 2012-04-13 – 2012-04-22 (×6): 200 mg via ORAL
  Filled 2012-04-12 (×8): qty 2

## 2012-04-12 MED ORDER — SODIUM CHLORIDE 0.9 % IV SOLN
250.0000 mL | INTRAVENOUS | Status: DC
  Administered 2012-04-14: 250 mL via INTRAVENOUS

## 2012-04-12 MED ORDER — LACTATED RINGERS IV SOLN: 500.0000 mL | Freq: Once | INTRAVENOUS | Status: AC | PRN

## 2012-04-12 MED ORDER — SODIUM CHLORIDE 0.45 % IV SOLN
INTRAVENOUS | Status: DC
  Administered 2012-04-12: 18:00:00 via INTRAVENOUS

## 2012-04-12 MED ORDER — ACETAMINOPHEN 160 MG/5ML PO SOLN: 650.0000 mg | ORAL | Status: AC

## 2012-04-12 MED ORDER — ASPIRIN EC 325 MG PO TBEC
325.0000 mg | DELAYED_RELEASE_TABLET | Freq: Every day | ORAL | Status: DC
  Administered 2012-04-13 – 2012-04-15 (×3): 325 mg via ORAL
  Filled 2012-04-12 (×4): qty 1

## 2012-04-12 MED ORDER — OXYCODONE HCL 5 MG PO TABS
5.0000 mg | ORAL_TABLET | ORAL | Status: DC | PRN
  Administered 2012-04-13 – 2012-04-21 (×8): 5 mg via ORAL
  Filled 2012-04-12 (×8): qty 1

## 2012-04-12 MED ORDER — DOPAMINE-DEXTROSE 1.6-5 MG/ML-% IV SOLN
INTRAVENOUS | Status: DC | PRN
  Administered 2012-04-12: 3 ug/kg/min via INTRAVENOUS

## 2012-04-12 MED ORDER — MIDAZOLAM HCL 2 MG/2ML IJ SOLN: 2.0000 mg | INTRAMUSCULAR | Status: DC | PRN

## 2012-04-12 MED ORDER — VECURONIUM BROMIDE 10 MG IV SOLR
INTRAVENOUS | Status: DC | PRN
  Administered 2012-04-12: 5 mg via INTRAVENOUS
  Administered 2012-04-12: 3 mg via INTRAVENOUS
  Administered 2012-04-12 (×2): 5 mg via INTRAVENOUS

## 2012-04-12 MED ORDER — TRANEXAMIC ACID 100 MG/ML IV SOLN
2500.0000 mg | INTRAVENOUS | Status: DC | PRN
  Administered 2012-04-12: 1.5 mg/kg/h via INTRAVENOUS

## 2012-04-12 MED ORDER — DEXMEDETOMIDINE HCL IN NACL 200 MCG/50ML IV SOLN
0.1000 ug/kg/h | INTRAVENOUS | Status: DC
  Administered 2012-04-12: 0.7 ug/kg/h via INTRAVENOUS
  Administered 2012-04-12: 0.4 ug/kg/h via INTRAVENOUS
  Filled 2012-04-12 (×3): qty 50

## 2012-04-12 MED ORDER — DEXMEDETOMIDINE HCL IN NACL 200 MCG/50ML IV SOLN
0.4000 ug/kg/h | INTRAVENOUS | Status: DC
  Filled 2012-04-12: qty 50

## 2012-04-12 MED ORDER — PHENYLEPHRINE HCL 10 MG/ML IJ SOLN
0.0000 ug/min | INTRAVENOUS | Status: DC
  Administered 2012-04-12: 80 ug/min via INTRAVENOUS
  Administered 2012-04-12: 70 ug/min via INTRAVENOUS
  Administered 2012-04-13: 75 ug/min via INTRAVENOUS
  Administered 2012-04-13: 10 ug/min via INTRAVENOUS
  Filled 2012-04-12 (×6): qty 2

## 2012-04-12 MED ORDER — AMIODARONE HCL IN DEXTROSE 360-4.14 MG/200ML-% IV SOLN
60.0000 mg/h | INTRAVENOUS | Status: AC
  Administered 2012-04-12: 33.3333 mL/h via INTRAVENOUS
  Filled 2012-04-12 (×2): qty 200

## 2012-04-12 MED ORDER — NITROGLYCERIN IN D5W 200-5 MCG/ML-% IV SOLN: 0.0000 ug/min | INTRAVENOUS | Status: DC

## 2012-04-12 MED ORDER — MIDAZOLAM HCL 5 MG/5ML IJ SOLN
INTRAMUSCULAR | Status: DC | PRN
  Administered 2012-04-12: 2 mg via INTRAVENOUS
  Administered 2012-04-12: 3 mg via INTRAVENOUS
  Administered 2012-04-12: 2 mg via INTRAVENOUS
  Administered 2012-04-12: 3 mg via INTRAVENOUS

## 2012-04-12 MED ORDER — MILRINONE IN DEXTROSE 200-5 MCG/ML-% IV SOLN
0.1250 ug/kg/min | INTRAVENOUS | Status: DC
  Administered 2012-04-12: 0.3 ug/kg/min via INTRAVENOUS
  Filled 2012-04-12: qty 100

## 2012-04-12 MED ORDER — SODIUM CHLORIDE 0.9 % IV SOLN
INTRAVENOUS | Status: DC
  Administered 2012-04-12: 21:00:00 via INTRAVENOUS
  Administered 2012-04-17: 20 mL via INTRAVENOUS

## 2012-04-12 MED ORDER — METOPROLOL TARTRATE 25 MG/10 ML ORAL SUSPENSION
12.5000 mg | Freq: Two times a day (BID) | ORAL | Status: DC
  Administered 2012-04-13: 12.5 mg
  Filled 2012-04-12 (×9): qty 5

## 2012-04-12 MED ORDER — LACTATED RINGERS IV SOLN
INTRAVENOUS | Status: DC | PRN
  Administered 2012-04-12: 07:00:00 via INTRAVENOUS

## 2012-04-12 MED ORDER — MILRINONE IN DEXTROSE 200-5 MCG/ML-% IV SOLN
INTRAVENOUS | Status: DC | PRN
  Administered 2012-04-12: .3 ug/kg/min via INTRAVENOUS

## 2012-04-12 MED ORDER — FENTANYL CITRATE 0.05 MG/ML IJ SOLN
INTRAMUSCULAR | Status: DC | PRN
  Administered 2012-04-12: 1000 ug via INTRAVENOUS
  Administered 2012-04-12 (×2): 100 ug via INTRAVENOUS
  Administered 2012-04-12: 150 ug via INTRAVENOUS

## 2012-04-12 MED ORDER — METOPROLOL TARTRATE 1 MG/ML IV SOLN: 2.5000 mg | INTRAVENOUS | Status: DC | PRN

## 2012-04-12 MED ORDER — AMIODARONE HCL IN DEXTROSE 360-4.14 MG/200ML-% IV SOLN
INTRAVENOUS | Status: DC | PRN
  Administered 2012-04-12: 150 mg via INTRAVENOUS

## 2012-04-12 MED ORDER — ALBUMIN HUMAN 5 % IV SOLN
INTRAVENOUS | Status: AC
  Filled 2012-04-12: qty 250

## 2012-04-12 MED ORDER — AMIODARONE HCL IN DEXTROSE 360-4.14 MG/200ML-% IV SOLN
30.0000 mg/h | INTRAVENOUS | Status: DC
  Administered 2012-04-12 – 2012-04-15 (×4): 30 mg/h via INTRAVENOUS
  Filled 2012-04-12 (×11): qty 200

## 2012-04-12 MED ORDER — ALBUMIN HUMAN 5 % IV SOLN
250.0000 mL | INTRAVENOUS | Status: AC | PRN
  Administered 2012-04-12 (×4): 250 mL via INTRAVENOUS
  Filled 2012-04-12: qty 500

## 2012-04-12 MED ORDER — BISACODYL 10 MG RE SUPP: 10.0000 mg | Freq: Every day | RECTAL | Status: DC

## 2012-04-12 MED ORDER — METOPROLOL TARTRATE 12.5 MG HALF TABLET
12.5000 mg | ORAL_TABLET | Freq: Two times a day (BID) | ORAL | Status: DC
  Administered 2012-04-14 – 2012-04-15 (×4): 12.5 mg via ORAL
  Filled 2012-04-12 (×9): qty 1

## 2012-04-12 MED ORDER — PROTAMINE SULFATE 10 MG/ML IV SOLN
INTRAVENOUS | Status: DC | PRN
  Administered 2012-04-12 (×5): 50 mg via INTRAVENOUS

## 2012-04-12 MED ORDER — ACETAMINOPHEN 500 MG PO TABS
1000.0000 mg | ORAL_TABLET | Freq: Four times a day (QID) | ORAL | Status: AC
  Administered 2012-04-13: 1000 mg via ORAL
  Administered 2012-04-13: 500 mg via ORAL
  Administered 2012-04-13 – 2012-04-14 (×4): 1000 mg via ORAL
  Administered 2012-04-14: 500 mg via ORAL
  Administered 2012-04-15 – 2012-04-17 (×11): 1000 mg via ORAL
  Filled 2012-04-12 (×10): qty 2
  Filled 2012-04-12: qty 1
  Filled 2012-04-12 (×10): qty 2

## 2012-04-12 MED ORDER — ALLOPURINOL 100 MG PO TABS
100.0000 mg | ORAL_TABLET | Freq: Every day | ORAL | Status: DC
  Administered 2012-04-13 – 2012-04-22 (×10): 100 mg via ORAL
  Filled 2012-04-12 (×10): qty 1

## 2012-04-12 MED ORDER — MAGNESIUM SULFATE 40 MG/ML IJ SOLN
4.0000 g | Freq: Once | INTRAMUSCULAR | Status: AC
  Administered 2012-04-12: 4 g via INTRAVENOUS
  Filled 2012-04-12: qty 100

## 2012-04-12 MED ORDER — SODIUM CHLORIDE 0.9 % IJ SOLN: 3.0000 mL | INTRAMUSCULAR | Status: DC | PRN

## 2012-04-12 MED ORDER — LIDOCAINE HCL (CARDIAC) 20 MG/ML IV SOLN
INTRAVENOUS | Status: AC
  Filled 2012-04-12: qty 5

## 2012-04-12 MED ORDER — MORPHINE SULFATE 2 MG/ML IJ SOLN: 1.0000 mg | INTRAMUSCULAR | Status: AC | PRN

## 2012-04-12 MED ORDER — ALBUMIN HUMAN 5 % IV SOLN
INTRAVENOUS | Status: DC | PRN
  Administered 2012-04-12 (×3): via INTRAVENOUS

## 2012-04-12 MED ORDER — DEXTROSE 5 % IV SOLN
1.5000 g | Freq: Two times a day (BID) | INTRAVENOUS | Status: AC
  Administered 2012-04-13 – 2012-04-14 (×4): 1.5 g via INTRAVENOUS
  Filled 2012-04-12 (×4): qty 1.5

## 2012-04-12 MED ORDER — LACTATED RINGERS IV SOLN
INTRAVENOUS | Status: DC
  Administered 2012-04-12: 21:00:00 via INTRAVENOUS

## 2012-04-12 SURGICAL SUPPLY — 149 items
ADAPTER CARDIO PERF ANTE/RETRO (ADAPTER) ×2 IMPLANT
ADH SRG 12 PREFL SYR 3 SPRDR (MISCELLANEOUS)
ADPR PRFSN 84XANTGRD RTRGD (ADAPTER) ×1
APL SKNCLS STERI-STRIP NONHPOA (GAUZE/BANDAGES/DRESSINGS)
APPLICATOR COTTON TIP 6IN STRL (MISCELLANEOUS) IMPLANT
ATRICLIP ×1 IMPLANT
ATTRACTOMAT 16X20 MAGNETIC DRP (DRAPES) ×2 IMPLANT
BAG DECANTER FOR FLEXI CONT (MISCELLANEOUS) ×2 IMPLANT
BENZOIN TINCTURE PRP APPL 2/3 (GAUZE/BANDAGES/DRESSINGS) IMPLANT
BLADE STERNUM SYSTEM 6 (BLADE) ×2 IMPLANT
BLADE SURG 15 STRL LF DISP TIS (BLADE) ×1 IMPLANT
BLADE SURG 15 STRL SS (BLADE) ×2
BLADE SURG ROTATE 9660 (MISCELLANEOUS) IMPLANT
BOOT SUTURE AID YELLOW STND (SUTURE) ×2 IMPLANT
CANISTER SUCTION 2500CC (MISCELLANEOUS) ×2 IMPLANT
CANN PRFSN .5XCNCT 15X34-48 (MISCELLANEOUS)
CANNULA AORTIC HI-FLOW 6.5M20F (CANNULA) ×2 IMPLANT
CANNULA ARTERIAL 007325 (MISCELLANEOUS) IMPLANT
CANNULA ARTERIAL 14F 007324 (MISCELLANEOUS) IMPLANT
CANNULA ARTERIAL 18F 007308 (MISCELLANEOUS) IMPLANT
CANNULA ARTERIAL 20F L7309 (MISCELLANEOUS) IMPLANT
CANNULA ARTERIAL 22F 007310 (MISCELLANEOUS) IMPLANT
CANNULA ARTERIAL 24F 007311 (MISCELLANEOUS) IMPLANT
CANNULA GUNDRY RCSP 15FR (MISCELLANEOUS) ×1 IMPLANT
CANNULA PRFSN .5XCNCT 15X34-48 (MISCELLANEOUS) IMPLANT
CANNULA VEN 2 STAGE (MISCELLANEOUS) ×1 IMPLANT
CANNULA VENOUS DUAL 32/40 (CANNULA) IMPLANT
CATH CPB KIT GERHARDT (MISCELLANEOUS) ×1 IMPLANT
CATH HEART VENT LEFT (CATHETERS) ×1 IMPLANT
CATH RETROPLEGIA CORONARY 14FR (CATHETERS) ×1 IMPLANT
CATH THORACIC 28FR (CATHETERS) ×2 IMPLANT
CATH/SQUID NICHOLS JEHLE COR (CATHETERS) ×1 IMPLANT
CLIP TI MEDIUM 6 (CLIP) ×2 IMPLANT
CLIP TI WIDE RED SMALL 24 (CLIP) IMPLANT
CLIP TI WIDE RED SMALL 6 (CLIP) IMPLANT
CLOTH BEACON ORANGE TIMEOUT ST (SAFETY) ×2 IMPLANT
CONN 1/2X1/2X1/2  BEN (MISCELLANEOUS)
CONN 1/2X1/2X1/2 BEN (MISCELLANEOUS) IMPLANT
CONN 3/8X3/8 GISH STERILE (MISCELLANEOUS) IMPLANT
CONN Y 3/8X3/8X3/8  BEN (MISCELLANEOUS)
CONN Y 3/8X3/8X3/8 BEN (MISCELLANEOUS) IMPLANT
CONT SPEC 4OZ CLIKSEAL STRL BL (MISCELLANEOUS) ×2 IMPLANT
COVER SURGICAL LIGHT HANDLE (MISCELLANEOUS) ×4 IMPLANT
CRADLE DONUT ADULT HEAD (MISCELLANEOUS) ×2 IMPLANT
DRAIN CHANNEL 15F RND FF W/TCR (WOUND CARE) IMPLANT
DRAIN CHANNEL 19F RND (DRAIN) IMPLANT
DRAIN CHANNEL 28F RND 3/8 FF (WOUND CARE) ×1 IMPLANT
DRAIN CHANNEL 32F RND 10.7 FF (WOUND CARE) ×1 IMPLANT
DRAIN SNY 10X20 3/4 PERF (WOUND CARE) IMPLANT
DRAIN WOUND SNY 15 RND (WOUND CARE) IMPLANT
DRAPE CARDIOVASCULAR INCISE (DRAPES) ×2
DRAPE SLUSH MACHINE 52X66 (DRAPES) IMPLANT
DRAPE SLUSH/WARMER DISC (DRAPES) ×1 IMPLANT
DRAPE SRG 135X102X78XABS (DRAPES) ×1 IMPLANT
DRSG COVADERM 4X10 (GAUZE/BANDAGES/DRESSINGS) ×1 IMPLANT
DRSG COVADERM 4X14 (GAUZE/BANDAGES/DRESSINGS) ×2 IMPLANT
ELECT BLADE 4.0 EZ CLEAN MEGAD (MISCELLANEOUS) ×2
ELECT CAUTERY BLADE 6.4 (BLADE) ×2 IMPLANT
ELECT REM PT RETURN 9FT ADLT (ELECTROSURGICAL) ×4
ELECTRODE BLDE 4.0 EZ CLN MEGD (MISCELLANEOUS) ×1 IMPLANT
ELECTRODE REM PT RTRN 9FT ADLT (ELECTROSURGICAL) ×2 IMPLANT
EVACUATOR SILICONE 100CC (DRAIN) IMPLANT
FELT TEFLON 6X6 (MISCELLANEOUS) IMPLANT
GLOVE BIO SURGEON STRL SZ 6.5 (GLOVE) ×3 IMPLANT
GLOVE BIOGEL PI IND STRL 6 (GLOVE) IMPLANT
GLOVE BIOGEL PI INDICATOR 6 (GLOVE) ×5
GLOVE SURG SS PI 6.0 STRL IVOR (GLOVE) ×5 IMPLANT
GLOVE SURG SS PI 6.5 STRL IVOR (GLOVE) ×2 IMPLANT
GLOVE SURG SS PI 7.5 STRL IVOR (GLOVE) ×3 IMPLANT
GOWN STRL NON-REIN LRG LVL3 (GOWN DISPOSABLE) ×10 IMPLANT
GRAFT WOVEN D/V 32DX30L (Vascular Products) ×1 IMPLANT
HEMOSTAT POWDER SURGIFOAM 1G (HEMOSTASIS) ×6 IMPLANT
HEMOSTAT SURGICEL 2X14 (HEMOSTASIS) ×2 IMPLANT
INSERT FOGARTY SM (MISCELLANEOUS) ×2 IMPLANT
INSERT FOGARTY XLG (MISCELLANEOUS) ×1 IMPLANT
KIT BASIN OR (CUSTOM PROCEDURE TRAY) ×2 IMPLANT
KIT PAIN CUSTOM (MISCELLANEOUS) IMPLANT
KIT ROOM TURNOVER OR (KITS) ×2 IMPLANT
KIT SUCTION CATH 14FR (SUCTIONS) ×4 IMPLANT
LEAD PACING MYOCARDI (MISCELLANEOUS) ×2 IMPLANT
LINE VENT (MISCELLANEOUS) ×1 IMPLANT
NDL 18GX1X1/2 (RX/OR ONLY) (NEEDLE) ×1 IMPLANT
NEEDLE 18GX1X1/2 (RX/OR ONLY) (NEEDLE) ×2 IMPLANT
NEEDLE AORTIC AIR ASPIRATING (NEEDLE) IMPLANT
NS IRRIG 1000ML POUR BTL (IV SOLUTION) ×12 IMPLANT
PACK OPEN HEART (CUSTOM PROCEDURE TRAY) ×2 IMPLANT
PAD ARMBOARD 7.5X6 YLW CONV (MISCELLANEOUS) ×4 IMPLANT
PAD DEFIB STAT PADZ MULTI (MISCELLANEOUS) ×1 IMPLANT
SEALANT SURG COSEAL 8ML (VASCULAR PRODUCTS) ×2 IMPLANT
SET CARDIOPLEGIA MPS 5001102 (MISCELLANEOUS) ×1 IMPLANT
SPONGE GAUZE 4X4 12PLY (GAUZE/BANDAGES/DRESSINGS) ×3 IMPLANT
SPONGE LAP 18X18 X RAY DECT (DISPOSABLE) IMPLANT
SPONGE LAP 4X18 X RAY DECT (DISPOSABLE) IMPLANT
STAPLER VISISTAT 35W (STAPLE) IMPLANT
STOPCOCK 4 WAY LG BORE MALE ST (IV SETS) IMPLANT
STRIP CLOSURE SKIN 1/2X4 (GAUZE/BANDAGES/DRESSINGS) IMPLANT
SURGIFLO W/THROMBIN 8M KIT (HEMOSTASIS) ×1 IMPLANT
SUT BONE WAX W31G (SUTURE) ×2 IMPLANT
SUT ETHIBON 2 0 V 52N 30 (SUTURE) ×4 IMPLANT
SUT ETHIBOND 2 0 SH (SUTURE) ×8
SUT ETHIBOND 2 0 SH 36X2 (SUTURE) ×4 IMPLANT
SUT MNCRL AB 3-0 PS2 18 (SUTURE) IMPLANT
SUT PROLENE 3 0 RB 1 (SUTURE) ×2 IMPLANT
SUT PROLENE 3 0 SH 1 (SUTURE) ×6 IMPLANT
SUT PROLENE 3 0 SH DA (SUTURE) ×2 IMPLANT
SUT PROLENE 4 0 RB 1 (SUTURE) ×10
SUT PROLENE 4 0 SH DA (SUTURE) IMPLANT
SUT PROLENE 4 0 TF (SUTURE) ×4 IMPLANT
SUT PROLENE 4-0 RB1 .5 CRCL 36 (SUTURE) ×1 IMPLANT
SUT PROLENE 5 0 C 1 36 (SUTURE) ×5 IMPLANT
SUT PROLENE 6 0 CC (SUTURE) IMPLANT
SUT PROLENE 7 0 BV 1 (SUTURE) IMPLANT
SUT PROLENE 7 0 BV1 MDA (SUTURE) IMPLANT
SUT SILK 1 TIES 10X30 (SUTURE) ×2 IMPLANT
SUT SILK 2 0 SH CR/8 (SUTURE) ×4 IMPLANT
SUT SILK 2 0 TIES 17X18 (SUTURE) ×2
SUT SILK 2-0 18XBRD TIE BLK (SUTURE) ×1 IMPLANT
SUT SILK 3 0 SH CR/8 (SUTURE) ×2 IMPLANT
SUT SILK 4 0 TIES 17X18 (SUTURE) ×2 IMPLANT
SUT STEEL 6MS V (SUTURE) ×2 IMPLANT
SUT STEEL STERNAL CCS#1 18IN (SUTURE) IMPLANT
SUT STEEL SZ 6 DBL 3X14 BALL (SUTURE) ×2 IMPLANT
SUT TEM PAC WIRE 2 0 SH (SUTURE) ×4 IMPLANT
SUT VIC AB 1 CT1 18XCR BRD 8 (SUTURE) IMPLANT
SUT VIC AB 1 CT1 8-18 (SUTURE)
SUT VIC AB 1 CTX 18 (SUTURE) ×4 IMPLANT
SUT VIC AB 1 CTX 27 (SUTURE) ×4 IMPLANT
SUT VIC AB 2-0 CT1 27 (SUTURE)
SUT VIC AB 2-0 CT1 TAPERPNT 27 (SUTURE) IMPLANT
SUT VIC AB 2-0 CTX 36 (SUTURE) ×2 IMPLANT
SUT VIC AB 3-0 SH 27 (SUTURE)
SUT VIC AB 3-0 SH 27X BRD (SUTURE) IMPLANT
SUT VIC AB 3-0 X1 27 (SUTURE) IMPLANT
SUT VICRYL 4-0 PS2 18IN ABS (SUTURE) IMPLANT
SUTURE E-PAK OPEN HEART (SUTURE) ×4 IMPLANT
SYR 10ML KIT SKIN ADHESIVE (MISCELLANEOUS) IMPLANT
SYSTEM SAHARA CHEST DRAIN ATS (WOUND CARE) ×2 IMPLANT
TAPE CLOTH SOFT 2X10 (GAUZE/BANDAGES/DRESSINGS) ×1 IMPLANT
TAPE CLOTH SURG 4X10 WHT LF (GAUZE/BANDAGES/DRESSINGS) ×1 IMPLANT
TOWEL OR 17X24 6PK STRL BLUE (TOWEL DISPOSABLE) ×4 IMPLANT
TOWEL OR 17X26 10 PK STRL BLUE (TOWEL DISPOSABLE) ×4 IMPLANT
TRAY CATH LUMEN 1 20CM STRL (SET/KITS/TRAYS/PACK) IMPLANT
TRAY FOLEY IC TEMP SENS 14FR (CATHETERS) ×2 IMPLANT
TUBE FEEDING 8FR 16IN STR KANG (MISCELLANEOUS) ×2 IMPLANT
TUBE SUCT INTRACARD DLP 20F (MISCELLANEOUS) ×2 IMPLANT
UNDERPAD 30X30 INCONTINENT (UNDERPADS AND DIAPERS) ×2 IMPLANT
VALVE MAGNA EASE 21MM (Prosthesis & Implant Heart) ×1 IMPLANT
VENT LEFT HEART 12002 (CATHETERS) ×2
WATER STERILE IRR 1000ML POUR (IV SOLUTION) ×4 IMPLANT

## 2012-04-12 NOTE — Anesthesia Preprocedure Evaluation (Addendum)
Anesthesia Evaluation  Patient identified by MRN, date of birth, ID band Patient awake    Reviewed: Allergy & Precautions, H&P , NPO status , Patient's Chart, lab work & pertinent test results, reviewed documented beta blocker date and time   History of Anesthesia Complications Negative for: history of anesthetic complications  Airway Mallampati: III TM Distance: <3 FB Neck ROM: Full    Dental No notable dental hx. (+) Teeth Intact, Dental Advisory Given and Caps   Pulmonary shortness of breath and at rest,  breath sounds clear to auscultation  Pulmonary exam normal       Cardiovascular hypertension, Pt. on medications and Pt. on home beta blockers + Past MI (cath mild non-obstructive ASCADz, EF 20%, severe AI), + Peripheral Vascular Disease (h/o LE embolus), +CHF and + DOE + dysrhythmias Atrial Fibrillation + pacemaker + Cardiac Defibrillator + Valvular Problems/Murmurs AI Rhythm:Regular Rate:Normal + Systolic murmurs pacemaker   Neuro/Psych Anxiety Right sided weakeness CVA (hemorrhagic with mild R weakness), Residual Symptoms    GI/Hepatic negative GI ROS, Neg liver ROS,   Endo/Other  Morbid obesity  Renal/GU negative Renal ROS     Musculoskeletal negative musculoskeletal ROS (+)   Abdominal (+) + obese,   Peds  Hematology   Anesthesia Other Findings   Reproductive/Obstetrics                          Anesthesia Physical Anesthesia Plan  ASA: IV  Anesthesia Plan: General   Post-op Pain Management:    Induction: Intravenous  Airway Management Planned: Oral ETT  Additional Equipment: Arterial line, PA Cath and TEE  Intra-op Plan:   Post-operative Plan: Post-operative intubation/ventilation  Informed Consent: I have reviewed the patients History and Physical, chart, labs and discussed the procedure including the risks, benefits and alternatives for the proposed anesthesia with the  patient or authorized representative who has indicated his/her understanding and acceptance.     Plan Discussed with: Surgeon and CRNA  Anesthesia Plan Comments: (Plan routine monitors, A-line, PA catheter, GETA with TEE and possible hypothermic circulatory arrest.  Patient will remain intubated post-op.)        Anesthesia Quick Evaluation

## 2012-04-12 NOTE — Progress Notes (Signed)
Reduced nitric oxide to 5 ppm.  Patient tolerating well.

## 2012-04-12 NOTE — Progress Notes (Signed)
Patient NO weaned to 20 ppm.  Patient tolerating well.

## 2012-04-12 NOTE — Brief Op Note (Addendum)
                   [address]            [place] [address]          [phone]    04/06/2012 - 04/12/2012  12:20 PM  PATIENT:  [name] R Kimery  71 y.o. female  PRE-OPERATIVE DIAGNOSIS:  Aortic insufficiency; ascending aortic aneurysm  POST-OPERATIVE DIAGNOSIS:Same  PROCEDURE:  Procedure(s): AORTIC VALVE REPLACEMENT (AVR)#21 MAGNA EASE  THORACIC ASCENDING ANEURYSM REPAIR #32 HEMASHIELD Placement of left Atrial Clip Fluro to  untangle and position  SG cath  SURGEON:  Surgeon(s): Delight Ovens, MD  PHYSICIAN ASSISTANT: WAYNE GOLD PA-C  ANESTHESIA:   general  PATIENT CONDITION:  ICU - intubated and hemodynamically stable.  PRE-OPERATIVE WEIGHT: 79.5kg  COMPLICATIONS: NO KNOWN No blood given in OR

## 2012-04-12 NOTE — Progress Notes (Signed)
Reduced nitric oxide to 10 ppm.  Patient tolerating well.

## 2012-04-12 NOTE — Progress Notes (Signed)
Reduced nitric oxide to 15 ppm.  Patient tolerating well.

## 2012-04-12 NOTE — Anesthesia Procedure Notes (Addendum)
Procedure Name: Intubation Date/Time: 04/12/2012 8:03 AM Performed by: Margaree Mackintosh Pre-anesthesia Checklist: Patient identified, Emergency Drugs available, Suction available, Patient being monitored and Timeout performed Patient Re-evaluated:Patient Re-evaluated prior to inductionOxygen Delivery Method: Circle system utilized Preoxygenation: Pre-oxygenation with 100% oxygen Intubation Type: IV induction Ventilation: Two handed mask ventilation required Laryngoscope Size: Mac and 3 Grade View: Grade I Tube type: Oral Tube size: 8.0 mm Number of attempts: 1 Airway Equipment and Method: Stylet Placement Confirmation: ETT inserted through vocal cords under direct vision,  positive ETCO2 and breath sounds checked- equal and bilateral Secured at: 23 cm Tube secured with: Tape Dental Injury: Teeth and Oropharynx as per pre-operative assessment

## 2012-04-12 NOTE — Progress Notes (Signed)
INO weaning started at 17. RT Will continue to monitor.

## 2012-04-12 NOTE — Progress Notes (Signed)
  Echocardiogram Echocardiogram Transesophageal has been performed.  [name] 04/12/2012, 9:01 AM

## 2012-04-12 NOTE — Progress Notes (Signed)
[facility] BRIEF SICU PROGRESS NOTE  Day of Surgery  S/P Procedure(s) (LRB): AORTIC VALVE REPLACEMENT (AVR) (N/A) THORACIC ASCENDING ANEURYSM REPAIR (AAA) (N/A)   Sedated on vent AV paced BP stable on fairly high dose Neo + dopamine @ 5 PA 42/29 on nitric oxide @ 20ppm and milrinone C.I. 2.2 Chest tube output low UOP 50-75 mL/hr  Plan: Continue current plan Wean nitric slowly Consider vent wean later if stable off nitric  OWEN,CLARENCE H 04/12/2012 7:54 PM

## 2012-04-12 NOTE — Preoperative (Signed)
Beta Blockers   Reason not to administer Beta Blockers:Not Applicable 

## 2012-04-12 NOTE — Transfer of Care (Signed)
Immediate Anesthesia Transfer of Care Note  Patient: [name]  Procedure(s) Performed: Procedure(s) (LRB): AORTIC VALVE REPLACEMENT (AVR) (N/A) THORACIC ASCENDING ANEURYSM REPAIR (AAA) (N/A)  Patient Location: 2312  Anesthesia Type: General  Level of Consciousness: sedated  Airway & Oxygen Therapy: Patient remains intubated per anesthesia plan and Patient placed on Ventilator (see vital sign flow sheet for setting)  Post-op Assessment: Post -op Vital signs reviewed and stable and report given to [facility], RN  Post vital signs: Reviewed and stable  Complications: No apparent anesthesia complications

## 2012-04-13 ENCOUNTER — Inpatient Hospital Stay (HOSPITAL_COMMUNITY): Payer: Medicare Other

## 2012-04-13 LAB — CBC
HCT: 27.5 % — ABNORMAL LOW (ref 36.0–46.0)
HCT: 27.5 % — ABNORMAL LOW (ref 36.0–46.0)
Hemoglobin: 9.1 g/dL — ABNORMAL LOW (ref 12.0–15.0)
Hemoglobin: 8.9 g/dL — ABNORMAL LOW (ref 12.0–15.0)
MCH: 30.8 pg (ref 26.0–34.0)
MCHC: 33.1 g/dL (ref 30.0–36.0)
RDW: 15.6 % — ABNORMAL HIGH (ref 11.5–15.5)
WBC: 16.2 10*3/uL — ABNORMAL HIGH (ref 4.0–10.5)

## 2012-04-13 LAB — GLUCOSE, CAPILLARY
Glucose-Capillary: 107 mg/dL — ABNORMAL HIGH (ref 70–99)
Glucose-Capillary: 111 mg/dL — ABNORMAL HIGH (ref 70–99)
Glucose-Capillary: 112 mg/dL — ABNORMAL HIGH (ref 70–99)
Glucose-Capillary: 116 mg/dL — ABNORMAL HIGH (ref 70–99)
Glucose-Capillary: 125 mg/dL — ABNORMAL HIGH (ref 70–99)
Glucose-Capillary: 132 mg/dL — ABNORMAL HIGH (ref 70–99)
Glucose-Capillary: 144 mg/dL — ABNORMAL HIGH (ref 70–99)
Glucose-Capillary: 145 mg/dL — ABNORMAL HIGH (ref 70–99)
Glucose-Capillary: 83 mg/dL (ref 70–99)
Glucose-Capillary: 84 mg/dL (ref 70–99)

## 2012-04-13 LAB — POCT I-STAT, CHEM 8
BUN: 13 mg/dL (ref 6–23)
Chloride: 104 mEq/L (ref 96–112)
HCT: 27 % — ABNORMAL LOW (ref 36.0–46.0)
Potassium: 4.2 mEq/L (ref 3.5–5.1)

## 2012-04-13 LAB — POCT I-STAT 3, ART BLOOD GAS (G3+)
Acid-base deficit: 4 mmol/L — ABNORMAL HIGH (ref 0.0–2.0)
Acid-base deficit: 5 mmol/L — ABNORMAL HIGH (ref 0.0–2.0)
Acid-base deficit: 3 mmol/L — ABNORMAL HIGH (ref 0.0–2.0)
Bicarbonate: 19.5 meq/L — ABNORMAL LOW (ref 20.0–24.0)
Bicarbonate: 21.2 meq/L (ref 20.0–24.0)
O2 Saturation: 98 %
O2 Saturation: 98 %
Patient temperature: 36.8
Patient temperature: 36.9
TCO2: 21 mmol/L (ref 0–100)
TCO2: 22 mmol/L (ref 0–100)
pCO2 arterial: 37 mmHg (ref 35.0–45.0)
pCO2 arterial: 33.5 mmHg — ABNORMAL LOW (ref 35.0–45.0)
pCO2 arterial: 34.5 mmHg — ABNORMAL LOW (ref 35.0–45.0)
pH, Arterial: 7.373 (ref 7.350–7.450)
pH, Arterial: 7.396 (ref 7.350–7.450)
pO2, Arterial: 119 mmHg — ABNORMAL HIGH (ref 80.0–100.0)
pO2, Arterial: 112 mmHg — ABNORMAL HIGH (ref 80.0–100.0)
pO2, Arterial: 98 mmHg (ref 80.0–100.0)

## 2012-04-13 LAB — CREATININE, SERUM
GFR calc Af Amer: 50 mL/min — ABNORMAL LOW (ref >90)
GFR calc non Af Amer: 43 mL/min — ABNORMAL LOW (ref >90)

## 2012-04-13 LAB — MAGNESIUM
Magnesium: 2.8 mg/dL — ABNORMAL HIGH (ref 1.5–2.5)
Magnesium: 2.7 mg/dL — ABNORMAL HIGH (ref 1.5–2.5)

## 2012-04-13 LAB — BASIC METABOLIC PANEL
CO2: 21 mEq/L (ref 19–32)
Chloride: 103 mEq/L (ref 96–112)
Potassium: 4 mEq/L (ref 3.5–5.1)

## 2012-04-13 MED ORDER — INSULIN ASPART 100 UNIT/ML ~~LOC~~ SOLN: 0.0000 [IU] | SUBCUTANEOUS | Status: DC

## 2012-04-13 MED ORDER — FUROSEMIDE 10 MG/ML IJ SOLN
40.0000 mg | Freq: Once | INTRAMUSCULAR | Status: AC
  Administered 2012-04-13: 40 mg via INTRAVENOUS
  Filled 2012-04-13: qty 4

## 2012-04-13 MED ORDER — ALBUMIN HUMAN 5 % IV SOLN
INTRAVENOUS | Status: AC
  Administered 2012-04-13: 12.5 g via INTRAVENOUS
  Filled 2012-04-13: qty 250

## 2012-04-13 MED ORDER — SODIUM BICARBONATE 8.4 % IV SOLN
25.0000 meq | Freq: Once | INTRAVENOUS | Status: AC
  Filled 2012-04-13: qty 25

## 2012-04-13 MED ORDER — ALBUMIN HUMAN 5 % IV SOLN
12.5000 g | INTRAVENOUS | Status: AC | PRN
  Administered 2012-04-13 (×2): 12.5 g via INTRAVENOUS
  Filled 2012-04-13: qty 250

## 2012-04-13 MED ORDER — INSULIN ASPART 100 UNIT/ML ~~LOC~~ SOLN: 0.0000 [IU] | SUBCUTANEOUS | Status: AC

## 2012-04-13 MED FILL — Calcium Chloride Inj 10%: INTRAVENOUS | Qty: 10 | Status: AC

## 2012-04-13 MED FILL — Electrolyte-R (PH 7.4) Solution: INTRAVENOUS | Qty: 5000 | Status: AC

## 2012-04-13 MED FILL — Potassium Chloride Inj 2 mEq/ML: INTRAVENOUS | Qty: 40 | Status: AC

## 2012-04-13 MED FILL — Sodium Chloride Irrigation Soln 0.9%: Qty: 3000 | Status: AC

## 2012-04-13 MED FILL — Mannitol IV Soln 20%: INTRAVENOUS | Qty: 500 | Status: AC

## 2012-04-13 MED FILL — Sodium Chloride IV Soln 0.9%: INTRAVENOUS | Qty: 1000 | Status: AC

## 2012-04-13 MED FILL — Magnesium Sulfate Inj 50%: INTRAMUSCULAR | Qty: 10 | Status: AC

## 2012-04-13 MED FILL — Sodium Bicarbonate IV Soln 8.4%: INTRAVENOUS | Qty: 50 | Status: AC

## 2012-04-13 MED FILL — Heparin Sodium (Porcine) Inj 1000 Unit/ML: INTRAMUSCULAR | Qty: 10 | Status: AC

## 2012-04-13 MED FILL — Lidocaine HCl IV Inj 20 MG/ML: INTRAVENOUS | Qty: 5 | Status: AC

## 2012-04-13 NOTE — Progress Notes (Signed)
Patient ID: [name], female   DOB: August 04, 1941, 71 y.o.   MRN: [id number]                   [address]            [place] [address]          [phone]     1 Day Post-Op Procedure(s) (LRB): AORTIC VALVE REPLACEMENT (AVR) (N/A) THORACIC ASCENDING ANEURYSM REPAIR (AAA) (N/A)  Total Length of Stay:  LOS: 7 days  BP 111/58  Pulse 95  Temp 98.3 F (36.8 C) (Axillary)  Resp 22  Ht 5\' 2"  (1.575 m)  Wt 199 lb 11.8 oz (90.6 kg)  BMI 36.53 kg/m2  SpO2 99%     . sodium chloride 20 mL/hr at 04/13/12 0800  . sodium chloride 0 mL/hr at 04/12/12 2030  . sodium chloride    . amiodarone (NEXTERONE PREMIX) 360 mg/200 mL dextrose 30 mg/hr (04/13/12 1500)  . dexmedetomidine Stopped (04/13/12 0500)  . DOPamine 5 mcg/kg/min (04/13/12 1500)  . insulin (NOVOLIN-R) infusion Stopped (04/12/12 2201)  . lactated ringers 20 mL/hr at 04/13/12 0800  . milrinone Stopped (04/13/12 1000)  . nitroGLYCERIN Stopped (04/12/12 1500)  . phenylephrine (NEO-SYNEPHRINE) Adult infusion 5 mcg/min (04/13/12 1500)     Lab Results  Component Value Date   WBC 16.6* 04/13/2012   HGB 9.2* 04/13/2012   HCT 27.0* 04/13/2012   PLT 98* 04/13/2012   GLUCOSE 124* 04/13/2012   CHOL 195 03/30/2012   TRIG 82.0 03/30/2012   HDL 57.30 03/30/2012   LDLDIRECT 152.6 09/01/2011   LDLCALC 121* 03/30/2012   ALT 12 04/06/2012   AST 23 04/06/2012   NA 135 04/13/2012   K 4.2 04/13/2012   CL 104 04/13/2012   CREATININE 1.30* 04/13/2012   BUN 13 04/13/2012   CO2 21 04/13/2012   TSH 1.786 04/06/2012   INR 1.78* 04/12/2012   HGBA1C 5.2 04/12/2012   milrinone and neo weaned off Stable off vent  Delight Ovens MD  Beeper [phone] Office [phone] 04/13/2012 5:31 PM

## 2012-04-13 NOTE — Procedures (Signed)
Extubation Procedure Note  Patient Details:   Name: [name] DOB: [birth year] MRN: [id number]   Airway Documentation:     Evaluation  O2 sats: stable throughout Complications: No apparent complications Patient did tolerate procedure well. Bilateral Breath Sounds: Clear;Diminished   Yes  Pt tolerated SICU rapid wean and was able to achieve around on VC and -25 on NIF. Pt was extubated to 5lpm  without incidence and demonstrated ability to perform IS achieving around x2. Pt resting comfortably at this time and is in no obvious respiratory distress. RT will continue to monitor.   Parke Poisson 04/13/2012, 10:47 AM

## 2012-04-13 NOTE — Progress Notes (Signed)
Decreased nitric oxide to 2 ppm.  Patient tolerating well.

## 2012-04-13 NOTE — Progress Notes (Signed)
Attempted to wean patient per SICU.  Patient unable to tolerate wean due to SVsT and V tack.

## 2012-04-13 NOTE — Progress Notes (Signed)
[name] has been stable off NO for over an hour. She is now able to hold head off bed, follow commands and is breathing over ventilator at this time with a RR 18-20. Does meet criteria for rapid wean at this point.

## 2012-04-13 NOTE — Progress Notes (Signed)
Placed back on full support d/t SVT after few minutes of attempted weaning.

## 2012-04-13 NOTE — Progress Notes (Signed)
Reduced patient's nitric oxide to 3 ppm.  Patient tolerated well.

## 2012-04-13 NOTE — Op Note (Signed)
[name], [name] NO.:  [id number]  MEDICAL RECORD NO.:  [id number]  LOCATION:  2312                         FACILITY:  [facility]  PHYSICIAN:  Sheliah Plane, MD    DATE OF BIRTH:  03-07-1941  DATE OF PROCEDURE:  04/12/2012 DATE OF DISCHARGE:                              OPERATIVE REPORT   PREOPERATIVE DIAGNOSES:  Severe aortic insufficiency with severely depressed left ventricular function and dilated ascending aorta and history of atrial fibrillation.  POSTOPERATIVE DIAGNOSES:  Severe aortic insufficiency with severely depressed left ventricular function and dilated ascending aorta and history of atrial fibrillation.  SURGICAL PROCEDURE:  Aortic valve replacement with a 21-mm pericardial tissue valve, [employer] model 3300TFX, serial number [id number]. Replacement of ascending aorta, supracoronary with a 32-mm Hemashield graft and placement of 30-mm atrial clip.  Fluoroscopy for repositioning of Swan-Ganz catheter.  SURGEON:  Sheliah Plane, MD  FIRST ASSISTANT:  Rowe Clack, PA-C  BRIEF HISTORY:  The patient is a 71 year old female, who has developed progressive severe class III to IV aortic congestive heart failure.  A defibrillator was then placed in the past.  The patient presents with worsening symptoms.  Echo shows severe aortic insufficiency.  CT scan of the chest shows a dilated ascending aorta at approximately 5 cm.  The patient recently was admitted with acute arterial embolus to the left leg.  With the patient's worsening symptoms, she is referred for consideration of aortic valve replacement.  With the patient's depressed LV function and dilated ventricle, she has had a greater risk with aortic valve replacement, but currently her heart failure symptoms cannot be controlled.  The patient was admitted to the hospital to stabilize her renal function, heart failure, and for cardiac catheterization and then to proceed with aortic  valve replacement. Risks and options have been discussed with the patient and her family in detail.  DESCRIPTION OF PROCEDURE:  With Swan-Ganz and arterial line monitors in place, the patient was brought to the operating room.  In the operating room, it became apparent that her Swan-Ganz was not functioning properly.  The fluoroscopy was brought to the room, and the Swan-Ganz was coiled and tied around the AICD lead.  With fluoroscopy, the Swan- Ganz was manipulated, uncoiled, pulled back, and repositioned with proper functioning.  We then proceeded with prepping and draping in the usual sterile manner.  TEE probe was placed and described the severe central aortic insufficiency.  Median sternotomy was performed. Pericardium was opened.  As noted on CT scan, the patient did have a dilated ascending aorta, so a crossclamp could be applied.  The patient was systemically heparinized.  Ascending aorta was cannulated.  High on the ascending aorta, a retrograde cardioplegia catheter was placed.  A dual-stage venous cannula was placed.  The patient was then placed on cardiopulmonary bypass 2.4 L/min/m2.  Right superior pulmonary vein vent was placed.  The patient's body temperature was cooled to 32 degrees. Aortic crossclamp was applied and 500 mL of retrograde cardioplegia was administered initially.  The aorta was then opened and additional antegrade cardioplegia was administered directly into the coronary ostium.  At the sinotubular ridge, the aorta was transected given good exposure  of a trileaflet aortic valve with severely rolled and retracted edges on all 3 leaflets.  The leaflets were excised and the annulus was sized for a 21 pericardial tissue valve.  #2 Tycron pledgeted sutures circumferentially were placed with the pledgets on the ventricular surface and used to secure a 21-mm pericardial tissue valve in place. The valve seated well without impingement on the coronary arteries.   A 32-mm Hemashield graft was then selected and a running 3-0 Prolene with felt strips was used to attach the proximal anastomosis to this just at the sinotubular ridge.  The patient's root was slightly dilated but with her age and severe LV dysfunction, it was decided not to replace her entire root.  The aorta was then trimmed up to where the crossclamp had been applied, and the distal anastomosis was performed in a similar fashion with a running 3-0 Prolene with felt strips.  Prior to completion, the heart was elevated and the atrial appendage, which was relatively small, was measured for a 30-mm AtriCure atrial clip, which was applied without difficulty.  De-airing maneuvers were performed, and the distal anastomosis was completed.  A __________ needle was placed into the ascending aorta for further de-airing of the heart.  Crossclamp was removed with total crossclamp time of 107 minutes.  The patient spontaneously converted to a sinus rhythm that was slow.  She was atrially paced.  Nitric oxide was started as we started ventilating. Atrial and ventricular pacing wires were applied.  She was atrially paced to increased rate.  Dopamine and milrinone infusions had been started.  With the patient's body temperature rewarmed, she was then ventilated and weaned from cardiopulmonary bypass without difficulty. She remained hemodynamically stable with nitric oxide at 30 parts per million.  PA pressures were approximately a third of her systemic pressure.  Two Blake mediastinal drains were left in place.  Pericardium was loosely reapproximated.  Sternum was closed with #6 stainless steel wire.  Fascia was closed with interrupted 0 Vicryl, running 3-0 Vicryl in subcutaneous tissue, and 4-0 subcuticular stitch in skin edges.  Dry dressings were applied.  Sponge and needle count was reported as correct at the completion, and the patient was transferred to surgical intensive care unit for further  postoperative care.  Total pump time was 141 minutes.  The patient did not require any blood bank blood products during the operative procedure.     Sheliah Plane, MD     EG/MEDQ  D:  04/13/2012  T:  04/13/2012  Job:  [id number]

## 2012-04-13 NOTE — Progress Notes (Signed)
CSW continues to follow patient for support and for d/c planning- patient is interested in SNF at d/c at [facility]. She has [employer] which will require PT/OT and authorization for SNF. CSW to continue process for SNF at d/c. Report given to CSW covering unit 2300. Reece Levy, MSW, Theresia Majors [phone]

## 2012-04-13 NOTE — Progress Notes (Signed)
Patient ID: [name], female   DOB: 29-Aug-1941, 71 y.o.   MRN: [id number] TCTS DAILY PROGRESS NOTE                   [address]            [place] [address]          [phone]      1 Day Post-Op Procedure(s) (LRB): AORTIC VALVE REPLACEMENT (AVR) (N/A) THORACIC ASCENDING ANEURYSM REPAIR (AAA) (N/A)  Total Length of Stay:  LOS: 7 days   Subjective: Remains on vent, follows commands and moses all extremiries  Objective: Vital signs in last 24 hours: Temp:  [93.6 F (34.2 C)-98.2 F (36.8 C)] 98.2 F (36.8 C) (07/16 0830) Pulse Rate:  [50-120] 90  (07/16 0830) Cardiac Rhythm:  [-] Normal sinus rhythm (07/16 0800) Resp:  [10-25] 16  (07/16 0830) BP: (67-118)/(41-66) 118/62 mmHg (07/16 0822) SpO2:  [98 %-100 %] 99 % (07/16 0830) Arterial Line BP: (86-122)/(48-74) 117/61 mmHg (07/16 0830) FiO2 (%):  [40 %-60 %] 40 % (07/16 0822) Weight:  [199 lb 11.8 oz (90.6 kg)] 199 lb 11.8 oz (90.6 kg) (07/16 0645)  Filed Weights   04/11/12 0629 04/12/12 0500 04/13/12 0645  Weight: 174 lb 9.7 oz (79.2 kg) 175 lb 4.8 oz (79.516 kg) 199 lb 11.8 oz (90.6 kg)    Weight change: 24 lb 7 oz (11.084 kg)   Hemodynamic parameters for last 24 hours: PAP: (31-56)/(19-29) 56/29 mmHg CO:  [1.8 L/min-5.7 L/min] 5.7 L/min CI:  [1.7 L/min/m2-3.3 L/min/m2] 3.2 L/min/m2  Intake/Output from previous day: 07/15 0701 - 07/16 0700 In: 10395.5 [I.V.:6800.5; Blood:955; NG/GT:90; IV Piggyback:2550] Out: 4235 [Urine:1375; Blood:2500; Chest Tube:360]  Intake/Output this shift: Total I/O In: 20 [I.V.:20] Out: -   Current Meds: Scheduled Meds:   . acetaminophen (TYLENOL) oral liquid 160 mg/5 mL  650 mg Per Tube NOW   Or  . acetaminophen  650 mg Rectal NOW  . acetaminophen  1,000 mg Oral Q6H   Or  . acetaminophen (TYLENOL) oral liquid 160 mg/5 mL  975 mg Per Tube Q6H  . allopurinol  100 mg Oral Daily  . amiodarone (NEXTERONE PREMIX) 360 mg/200 mL dextrose  60 mg/hr  Intravenous To OR  . aspirin EC  325 mg Oral Daily   Or  . aspirin  324 mg Per Tube Daily  . bisacodyl  10 mg Oral Daily   Or  . bisacodyl  10 mg Rectal Daily  . cefUROXime (ZINACEF)  IV  1.5 g Intravenous To OR  . cefUROXime (ZINACEF)  IV  1.5 g Intravenous Q12H  . dexmedetomidine  0.1-0.7 mcg/kg/hr Intravenous To OR  . docusate sodium  200 mg Oral Daily  . famotidine (PEPCID) IV  20 mg Intravenous Q12H  . insulin aspart  0-24 Units Subcutaneous Q2H   Followed by  . insulin aspart  0-24 Units Subcutaneous Q4H  . insulin regular  0-10 Units Intravenous TID WC  . magnesium sulfate  4 g Intravenous Once  . metoprolol tartrate  12.5 mg Oral BID   Or  . metoprolol tartrate  12.5 mg Per Tube BID  . pantoprazole  40 mg Oral Q1200  . phenylephrine (NEO-SYNEPHRINE) Adult infusion  30-200 mcg/min Intravenous To OR  . potassium chloride  10 mEq Intravenous Q1 Hr x 3  . sodium chloride  3 mL Intravenous Q12H  . tranexamic acid  15 mg/kg Intravenous To OR  . vancomycin  1,000 mg  Intravenous Once  . DISCONTD: allopurinol  100 mg Oral Daily  . DISCONTD: carvedilol  3.125 mg Oral BID WC  . DISCONTD: cefUROXime (ZINACEF)  IV  750 mg Intravenous To OR  . DISCONTD: dexmedetomidine  0.4-1.2 mcg/kg/hr Intravenous To OR  . DISCONTD: DOPamine  2-20 mcg/kg/min Intravenous To OR  . DISCONTD: epinephrine  0.5-20 mcg/min Intravenous To OR  . DISCONTD: ferrous sulfate  325 mg Oral Q breakfast  . DISCONTD: magnesium sulfate  40 mEq Other To OR  . DISCONTD: nitroGLYCERIN  2-200 mcg/min Intravenous To OR  . DISCONTD: nitroglycerin-nicardipine-HEPARIN-sodium bicarbonate irrigation for artery spasm   Irrigation To OR  . DISCONTD: potassium chloride  80 mEq Other To OR  . DISCONTD: potassium chloride  20 mEq Oral Daily  . DISCONTD: sodium chloride  3 mL Intravenous Q12H  . DISCONTD: tranexamic acid  2 mg/kg Intracatheter To OR  . DISCONTD: tranexamic acid (CYKLOKAPRON) infusion ([facility])  1.5 mg/kg/hr Intravenous  To OR   Continuous Infusions:   . sodium chloride 20 mL/hr at 04/13/12 0800  . sodium chloride 0 mL/hr at 04/12/12 2030  . sodium chloride    . amiodarone (NEXTERONE PREMIX) 360 mg/200 mL dextrose 30 mg/hr (04/13/12 0700)  . dexmedetomidine Stopped (04/13/12 0500)  . DOPamine 5 mcg/kg/min (04/13/12 0700)  . insulin (NOVOLIN-R) infusion Stopped (04/12/12 2201)  . lactated ringers 30 mL/hr at 04/13/12 0700  . milrinone 0.3 mcg/kg/min (04/13/12 0700)  . nitroGLYCERIN Stopped (04/12/12 1500)  . phenylephrine (NEO-SYNEPHRINE) Adult infusion 90 mcg/min (04/13/12 0700)  . DISCONTD: heparin 1,150 Units/hr (04/11/12 2135)   PRN Meds:.albumin human, albumin human, lactated ringers, metoprolol, midazolam, morphine injection, morphine injection, ondansetron (ZOFRAN) IV, oxyCODONE, sodium chloride, DISCONTD: sodium chloride, DISCONTD: sodium chloride, DISCONTD: 0.9 % irrigation (POUR BTL), DISCONTD: acetaminophen, DISCONTD: hemostatic agents, DISCONTD: hemostatic agents, DISCONTD: hemostatic agents, DISCONTD: hemostatic agents, DISCONTD: nitroGLYCERIN DISCONTD: ondansetron (ZOFRAN) IV, DISCONTD: sodium chloride, DISCONTD: Surgifoam 1 Gm with 0.9% sodium chloride (4 ml) topical solution  General appearance: alert, cooperative and no distress Neurologic: intact Heart: regular rate and rhythm, S1, S2 normal, no murmur, click, rub or gallop and normal apical impulse Lungs: diminished breath sounds bibasilar  Lab Results: CBC: Basename 04/13/12 0416 04/12/12 2012 04/12/12 2010  WBC 16.6* -- 16.0*  HGB 9.1* 9.9* --  HCT 27.5* 29.0* --  PLT 98* -- 105*   BMET:  Basename 04/13/12 0416 04/12/12 2012 04/12/12 0600  NA 135 138 --  K 4.0 3.9 --  CL 103 105 --  CO2 21 -- 23  GLUCOSE 148* 146* --  BUN 13 12 --  CREATININE 1.14* 1.10 --  CALCIUM 8.3* -- 9.2    PT/INR:  Basename 04/12/12 1436  LABPROT 21.0*  INR 1.78*   Radiology: Dg Chest Portable 1 View In Am  04/13/2012  *RADIOLOGY REPORT*   Clinical Data: Postoperative examination (thoracic aortic aneurysm repair  PORTABLE CHEST - 1 VIEW  Comparison: 04/12/2012; 04/06/2012; chest CT - 04/09/2012  Findings:  Grossly unchanged borderline enlarged cardiac silhouette and mediastinal contours post median sternotomy and aortic valve replacement.  Atherosclerotic calcifications within the aortic arch.  Stable positioning of support apparatus including midline mediastinal drain.  No definite pneumothorax.  Worsening bibasilar heterogeneous opacities, left greater than right.  Query small left- sided effusion.  Pulmonary vasculature remains indistinct with cephalization of flow.  Unchanged bones.  IMPRESSION: 1.  Stable positioning of support apparatus.  No pneumothorax. 2.  Worsening bibasilar opacities, left greater than right, possibly atelectasis. 3.  Mild pulmonary  edema is suspected.  Original Report Authenticated By: Waynard Reeds, M.D.   Dg Chest Portable 1 View  04/12/2012  *RADIOLOGY REPORT*  Clinical Data: Postop aortic valve replacement.  PORTABLE CHEST - 1 VIEW  Comparison: CT chest 04/09/2012 and chest radiograph 04/06/2012.  Findings: Endotracheal tube terminates approximately 1.1 cm above the carina.  Nasogastric tube is followed into the stomach.  Right IJ Swan-Ganz catheter tip projects over the proximal right pulmonary artery.  Mediastinal drains are in place.  Seven intact sternotomy wires.  Epicardial pacer wires are noted.  ICD lead tip is stable in position.  Heart is enlarged, stable.  Thoracic aorta is calcified.  Lungs are somewhat low in volume with mild interstitial prominence and indistinctness.  Hilar regions are prominent, which may be due to postoperative state.  Small left pleural effusion.  No definite pneumothorax.  IMPRESSION:  1.  Endotracheal tube is somewhat low lying.  Pulling back 2-3 cm would better position the tip above the carina. 2.  Low lung volumes with vascular crowding versus mild edema. 3.  Small left  pleural effusion.  Original Report Authenticated By: Reyes Ivan, M.D.   Dg C-arm 1-60 Min-no Report  04/12/2012  CLINICAL DATA: swan catheter adjustment   C-ARM 1-60 MINUTES  Fluoroscopy was utilized by the requesting physician.  No radiographic  interpretation.       Assessment/Plan: S/P Procedure(s) (LRB): AORTIC VALVE REPLACEMENT (AVR) (N/A) THORACIC ASCENDING ANEURYSM REPAIR (AAA) (N/A) Mobilize Diuresis Continue foley due to diuresing patient, strict I&O, patient critically ill, patient in ICU and urinary output monitoring wean milrinone, then neo Wean vent as tolerated Expected Acute  Blood - loss Anemia, no transfusion so far AICD back on    Delight Ovens MD  Beeper [phone] Office [phone] 04/13/2012 8:40 AM

## 2012-04-13 NOTE — Progress Notes (Signed)
Reduced nitric oxide to 1 ppm.  Patient tolerating well.

## 2012-04-14 ENCOUNTER — Inpatient Hospital Stay (HOSPITAL_COMMUNITY): Payer: Medicare Other

## 2012-04-14 ENCOUNTER — Encounter: Payer: Self-pay | Admitting: Cardiothoracic Surgery

## 2012-04-14 LAB — CBC
HCT: 27.6 % — ABNORMAL LOW (ref 36.0–46.0)
Hemoglobin: 9 g/dL — ABNORMAL LOW (ref 12.0–15.0)
MCH: 30.3 pg (ref 26.0–34.0)
MCHC: 32.6 g/dL (ref 30.0–36.0)
MCV: 92.9 fL (ref 78.0–100.0)
Platelets: 78 10*3/uL — ABNORMAL LOW (ref 150–400)
RBC: 2.97 MIL/uL — ABNORMAL LOW (ref 3.87–5.11)
RDW: 15.9 % — ABNORMAL HIGH (ref 11.5–15.5)
WBC: 17.5 10*3/uL — ABNORMAL HIGH (ref 4.0–10.5)

## 2012-04-14 LAB — PROTIME-INR
INR: 1.53 — ABNORMAL HIGH (ref 0.00–1.49)
Prothrombin Time: 18.7 seconds — ABNORMAL HIGH (ref 11.6–15.2)

## 2012-04-14 LAB — GLUCOSE, CAPILLARY
Glucose-Capillary: 113 mg/dL — ABNORMAL HIGH (ref 70–99)
Glucose-Capillary: 98 mg/dL (ref 70–99)
Glucose-Capillary: 104 mg/dL — ABNORMAL HIGH (ref 70–99)

## 2012-04-14 LAB — BASIC METABOLIC PANEL
BUN: 16 mg/dL (ref 6–23)
CO2: 22 mEq/L (ref 19–32)
Calcium: 8.5 mg/dL (ref 8.4–10.5)
Chloride: 99 mEq/L (ref 96–112)
Creatinine, Ser: 1.43 mg/dL — ABNORMAL HIGH (ref 0.50–1.10)
GFR calc Af Amer: 42 mL/min — ABNORMAL LOW (ref >90)
GFR calc non Af Amer: 36 mL/min — ABNORMAL LOW (ref >90)
Glucose, Bld: 117 mg/dL — ABNORMAL HIGH (ref 70–99)
Potassium: 4.2 mEq/L (ref 3.5–5.1)
Sodium: 133 mEq/L — ABNORMAL LOW (ref 135–145)

## 2012-04-14 MED ORDER — FUROSEMIDE 10 MG/ML IJ SOLN
80.0000 mg | Freq: Two times a day (BID) | INTRAMUSCULAR | Status: AC
  Administered 2012-04-14 (×2): 80 mg via INTRAVENOUS
  Filled 2012-04-14 (×2): qty 8

## 2012-04-14 NOTE — Progress Notes (Signed)
POD #2 AVR, repair ascending aneurysm  Resting comfortably  BP 108/70  Pulse 77  Temp 97.8 F (36.6 C) (Oral)  Resp 13  Ht 5\' 2"  (1.575 m)  Wt 199 lb 8.3 oz (90.5 kg)  BMI 36.49 kg/m2  SpO2 100%   Intake/Output Summary (Last 24 hours) at 04/14/12 1740 Last data filed at 04/14/12 1500  Gross per 24 hour  Intake 1825.92 ml  Output   1220 ml  Net 605.92 ml    Good response to lasix this AM, has another dose ordered tonight  Creatinine 1.43 this AM- on dopamine @ 2.5 mcg/kg/min

## 2012-04-14 NOTE — Progress Notes (Signed)
Clinical Social Work-CSW continuing to follow and has confirmed bed with [facility]-CSW will facilitate d/c to SNF when pt medically stable- Jodean Lima, [phone]

## 2012-04-14 NOTE — Progress Notes (Signed)
Patient ID: [name], female   DOB: Oct 24, 1940, 71 y.o.   MRN: [id number] TCTS DAILY PROGRESS NOTE                   [address]            [place] [address]          [phone]      2 Days Post-Op Procedure(s) (LRB): AORTIC VALVE REPLACEMENT (AVR) (N/A) THORACIC ASCENDING ANEURYSM REPAIR (AAA) (N/A)  Total Length of Stay:  LOS: 8 days   Subjective: Awake and alert, no complaints, good pain control  Objective: Vital signs in last 24 hours: Temp:  [97.3 F (36.3 C)-98.8 F (37.1 C)] 97.4 F (36.3 C) (07/17 0722) Pulse Rate:  [75-102] 91  (07/17 0700) Cardiac Rhythm:  [-] Normal sinus rhythm (07/16 2000) Resp:  [16-34] 22  (07/17 0700) BP: (83-122)/(52-80) 119/78 mmHg (07/17 0700) SpO2:  [94 %-100 %] 98 % (07/17 0700) Arterial Line BP: (95-130)/(48-74) 128/74 mmHg (07/17 0700) FiO2 (%):  [40 %] 40 % (07/16 1000) Weight:  [199 lb 8.3 oz (90.5 kg)] 199 lb 8.3 oz (90.5 kg) (07/17 0700)  Filed Weights   04/12/12 0500 04/13/12 0645 04/14/12 0700  Weight: 175 lb 4.8 oz (79.516 kg) 199 lb 11.8 oz (90.6 kg) 199 lb 8.3 oz (90.5 kg)    Weight change: -3.5 oz (-0.1 kg)   Hemodynamic parameters for last 24 hours: PAP: (48-56)/(24-30) 53/27 mmHg CO:  [5.7 L/min] 5.7 L/min CI:  [3.2 L/min/m2] 3.2 L/min/m2  Intake/Output from previous day: 07/16 0701 - 07/17 0700 In: 2021.8 [I.V.:1917.8; IV Piggyback:104] Out: 1305 [Urine:955; Emesis/NG output:200; Chest Tube:150]  Intake/Output this shift:    Current Meds: Scheduled Meds:   . acetaminophen  1,000 mg Oral Q6H   Or  . acetaminophen (TYLENOL) oral liquid 160 mg/5 mL  975 mg Per Tube Q6H  . allopurinol  100 mg Oral Daily  . aspirin EC  325 mg Oral Daily   Or  . aspirin  324 mg Per Tube Daily  . bisacodyl  10 mg Oral Daily   Or  . bisacodyl  10 mg Rectal Daily  . cefUROXime (ZINACEF)  IV  1.5 g Intravenous Q12H  . docusate sodium  200 mg Oral Daily  . famotidine (PEPCID) IV  20 mg Intravenous  Q12H  . furosemide  40 mg Intravenous Once  . furosemide  80 mg Intravenous Q12H  . insulin aspart  0-24 Units Subcutaneous Q4H  . metoprolol tartrate  12.5 mg Oral BID   Or  . metoprolol tartrate  12.5 mg Per Tube BID  . pantoprazole  40 mg Oral Q1200  . sodium bicarbonate  25 mEq Intravenous Once  . sodium chloride  3 mL Intravenous Q12H  . DISCONTD: insulin aspart  0-24 Units Subcutaneous Q4H  . DISCONTD: insulin regular  0-10 Units Intravenous TID WC   Continuous Infusions:   . sodium chloride 20 mL/hr at 04/13/12 0800  . sodium chloride 0 mL/hr at 04/12/12 2030  . sodium chloride    . amiodarone (NEXTERONE PREMIX) 360 mg/200 mL dextrose 30 mg/hr (04/14/12 0700)  . dexmedetomidine Stopped (04/13/12 0500)  . DOPamine 5 mcg/kg/min (04/14/12 0700)  . insulin (NOVOLIN-R) infusion Stopped (04/12/12 2201)  . lactated ringers 40 mL/hr at 04/14/12 0700  . milrinone Stopped (04/13/12 1000)  . nitroGLYCERIN Stopped (04/12/12 1500)  . phenylephrine (NEO-SYNEPHRINE) Adult infusion Stopped (04/13/12 1600)   PRN Meds:.metoprolol, midazolam, morphine injection, ondansetron (  ZOFRAN) IV, oxyCODONE, sodium chloride  General appearance: alert, cooperative, fatigued and no distress Neurologic: intact Heart: regular rate and rhythm, S1, S2 normal, no murmur, click, rub or gallop and normal apical impulse Lungs: diminished breath sounds bibasilar Abdomen: soft, non-tender; bowel sounds normal; no masses,  no organomegaly Extremities: extremities normal, atraumatic, no cyanosis or edema and Homans sign is negative, no sign of DVT Wound: sternum stable  Lab Results: CBC: Basename 04/14/12 0335 04/13/12 1710 04/13/12 1700  WBC 17.5* -- 16.2*  HGB 9.0* 9.2* --  HCT 27.6* 27.0* --  PLT 78* -- 81*   BMET:  Basename 04/14/12 0335 04/13/12 1710 04/13/12 0416  NA 133* 135 --  K 4.2 4.2 --  CL 99 104 --  CO2 22 -- 21  GLUCOSE 117* 124* --  BUN 16 13 --  CREATININE 1.43* 1.30* --  CALCIUM  8.5 -- 8.3*    PT/INR:  Basename 04/14/12 0335  LABPROT 18.7*  INR 1.53*   Radiology:  Dg Chest Portable 1 View In Am  04/13/2012  *RADIOLOGY REPORT*  Clinical Data: Postoperative examination (thoracic aortic aneurysm repair  PORTABLE CHEST - 1 VIEW  Comparison: 04/12/2012; 04/06/2012; chest CT - 04/09/2012  Findings:  Grossly unchanged borderline enlarged cardiac silhouette and mediastinal contours post median sternotomy and aortic valve replacement.  Atherosclerotic calcifications within the aortic arch.  Stable positioning of support apparatus including midline mediastinal drain.  No definite pneumothorax.  Worsening bibasilar heterogeneous opacities, left greater than right.  Query small left- sided effusion.  Pulmonary vasculature remains indistinct with cephalization of flow.  Unchanged bones.  IMPRESSION: 1.  Stable positioning of support apparatus.  No pneumothorax. 2.  Worsening bibasilar opacities, left greater than right, possibly atelectasis. 3.  Mild pulmonary edema is suspected.  Original Report Authenticated By: Waynard Reeds, M.D.     Assessment/Plan: S/P Procedure(s) (LRB): AORTIC VALVE REPLACEMENT (AVR) (N/A) THORACIC ASCENDING ANEURYSM REPAIR (AAA) (N/A) Leave foley to monitor i/o and diuresis Mobilize Monitor renal function, class iii insufficiency preop     Delight Ovens MD  Beeper [phone] Office [phone] 04/14/2012 7:52 AM

## 2012-04-14 NOTE — Progress Notes (Signed)
Physical Therapy Re-Evaluation Patient Details Name: [name] MRN: [id number] DOB: 1941-01-20 Today's Date: 04/14/2012 Time: 9629-5284 PT Time Calculation (min): 32 min  PT Assessment / Plan / Recommendation Comments on Treatment Session  Pt admitted and evaluated 04/10/12 by PT prior to AVR on 04/12/12.  Pt now re-evaluated s/p AVR and is appropriately weaker as expected from surgery requiring further PT acutely as well as at STSNF at d/c.  Pt motivated and able to ambulate 60 feet this am.  Goals still appropriate for this episode of care.  Will follow.    Follow Up Recommendations  Skilled nursing facility    Barriers to Discharge        Equipment Recommendations  Defer to next venue    Recommendations for Other Services    Frequency Min 3X/week   Plan Discharge plan remains appropriate;Frequency remains appropriate    Precautions / Restrictions Precautions Precautions: Sternal;Fall Restrictions Weight Bearing Restrictions: No Other Position/Activity Restrictions: Educated on sternal precautions.   Pertinent Vitals/Pain None    Mobility  Bed Mobility Bed Mobility: Supine to Sit Supine to Sit: 4: Min assist;HOB elevated (HOB 30 degrees.) Details for Bed Mobility Assistance: Assist for trunk to translate anterior over BOS while limiting use of bilateral UEs due to sternal precautions.  Pt able to move bilateral LEs to EOB without assist.  Cues for sequence. Transfers Transfers: Sit to Stand;Stand to Sit Sit to Stand: 4: Min assist;Without upper extremity assist;From bed Stand to Sit: 4: Min assist;Without upper extremity assist;To chair/3-in-1 Details for Transfer Assistance: Assist for balance and due to bilateral LE weakness with cues to limit use of bilateral UEs in order to maintain sternal precautions.  Cues for sequence. Ambulation/Gait Ambulation/Gait Assistance: 1: +2 Total assist Ambulation/Gait: Patient Percentage: 70% Ambulation Distance (Feet): 60  Feet Assistive device: Other (Comment) (Pushing w/c due to increased lines/leads.) Ambulation/Gait Assistance Details: Assist for balance with cues for tall posture and to stay close to w/c for safety.  Chair follow due to fatigue. Gait Pattern: Decreased stride length;Trunk flexed;Narrow base of support Gait velocity: decreased Stairs: No Wheelchair Mobility Wheelchair Mobility: No    Exercises     PT Diagnosis:    PT Problem List:   PT Treatment Interventions:     PT Goals Acute Rehab PT Goals PT Goal Formulation: With patient Time For Goal Achievement: 04/24/12 Potential to Achieve Goals: Good PT Goal: Supine/Side to Sit - Progress: Progressing toward goal PT Goal: Sit to Stand - Progress: Progressing toward goal PT Goal: Stand to Sit - Progress: Progressing toward goal PT Goal: Ambulate - Progress: Progressing toward goal  Visit Information  Last PT Received On: 04/14/12 Assistance Needed: +2 (Lines/leads.)    Subjective Data  Subjective: "I am glad that I get to get up." Patient Stated Goal: have surgery and go home   Cognition  Overall Cognitive Status: Appears within functional limits for tasks assessed/performed Arousal/Alertness: Awake/alert Orientation Level: Appears intact for tasks assessed Behavior During Session: [facility] for tasks performed    Balance  Balance Balance Assessed: No  End of Session PT - End of Session Equipment Utilized During Treatment: Gait belt Activity Tolerance: Patient tolerated treatment well Patient left: in chair;with call bell/phone within reach Nurse Communication: Mobility status   GP     Cephus Shelling 04/14/2012, 1:48 PM  04/14/2012 Cephus Shelling, PT, DPT [phone]

## 2012-04-15 ENCOUNTER — Inpatient Hospital Stay (HOSPITAL_COMMUNITY): Payer: Medicare Other

## 2012-04-15 LAB — TYPE AND SCREEN
ABO/RH(D): A POS
Antibody Screen: NEGATIVE
Unit division: 0
Unit division: 0
Unit division: 0
Unit division: 0
Unit division: 0
Unit division: 0

## 2012-04-15 LAB — CBC
HCT: 26.9 % — ABNORMAL LOW (ref 36.0–46.0)
Hemoglobin: 8.9 g/dL — ABNORMAL LOW (ref 12.0–15.0)
MCH: 30.5 pg (ref 26.0–34.0)
MCHC: 33.1 g/dL (ref 30.0–36.0)
MCV: 92.1 fL (ref 78.0–100.0)
Platelets: 101 10*3/uL — ABNORMAL LOW (ref 150–400)
RBC: 2.92 MIL/uL — ABNORMAL LOW (ref 3.87–5.11)
RDW: 15.8 % — ABNORMAL HIGH (ref 11.5–15.5)
WBC: 14.2 10*3/uL — ABNORMAL HIGH (ref 4.0–10.5)

## 2012-04-15 LAB — GLUCOSE, CAPILLARY
Glucose-Capillary: 87 mg/dL (ref 70–99)
Glucose-Capillary: 87 mg/dL (ref 70–99)
Glucose-Capillary: 99 mg/dL (ref 70–99)
Glucose-Capillary: 99 mg/dL (ref 70–99)

## 2012-04-15 LAB — BASIC METABOLIC PANEL
BUN: 25 mg/dL — ABNORMAL HIGH (ref 6–23)
CO2: 23 mEq/L (ref 19–32)
Calcium: 8.4 mg/dL (ref 8.4–10.5)
Chloride: 101 mEq/L (ref 96–112)
Creatinine, Ser: 1.7 mg/dL — ABNORMAL HIGH (ref 0.50–1.10)
GFR calc Af Amer: 34 mL/min — ABNORMAL LOW (ref >90)
GFR calc non Af Amer: 29 mL/min — ABNORMAL LOW (ref >90)
Glucose, Bld: 97 mg/dL (ref 70–99)
Potassium: 3.9 mEq/L (ref 3.5–5.1)
Sodium: 137 mEq/L (ref 135–145)

## 2012-04-15 MED ORDER — PRO-STAT SUGAR FREE PO LIQD
30.0000 mL | Freq: Three times a day (TID) | ORAL | Status: DC
  Administered 2012-04-15 – 2012-04-22 (×18): 30 mL via ORAL
  Filled 2012-04-15 (×23): qty 30

## 2012-04-15 MED ORDER — BOOST / RESOURCE BREEZE PO LIQD
1.0000 | Freq: Three times a day (TID) | ORAL | Status: DC
  Administered 2012-04-15 – 2012-04-22 (×19): 1 via ORAL

## 2012-04-15 MED ORDER — AMIODARONE HCL 200 MG PO TABS
200.0000 mg | ORAL_TABLET | Freq: Two times a day (BID) | ORAL | Status: AC
  Administered 2012-04-15 – 2012-04-21 (×14): 200 mg via ORAL
  Filled 2012-04-15 (×15): qty 1

## 2012-04-15 MED ORDER — AMIODARONE HCL 200 MG PO TABS
200.0000 mg | ORAL_TABLET | Freq: Every day | ORAL | Status: DC
  Administered 2012-04-22: 200 mg via ORAL
  Filled 2012-04-15: qty 1

## 2012-04-15 NOTE — Consult Note (Signed)
Pharmacy note: Amiodarone Drug Interactions  There are > 700 drugs that are known to interact with Amiodarone.    The clinically significant interactions with Amiodarone include Zocor, Lasix (increased risk for hypokalemia), Coumadin, Quinolones (Levofloxacin, Moxifloxacin, but NOT as much Ciprofloxacin), Azithromycin, Digoxin, Loratidine.  Other drugs are not as common, or less likely to have clinically significant interactions.  There are numerous lists available with variable risks when given concurrently with Amiodarone.  Other useful references address QTc prolongation and the added risks for Torsades.  (www.azcert.org).    Grapefruit juice may increase the blood levels and side effects of various drug metabolized by the cytochrome-P450 system - this includes dihydropyridine calcium channel blockers (verapamil, plendil, adalat, procardia, norvasc and others), some HMG Co-A reductase inhibitors (Zocor, Lipitor, Mevacor but Not Pravachol or Lescol).  Crestor has a lower risk for adverse interactions with Amiodarone.  Recommendations: 1.  Avoid Zocor (simvastatin).  Crestor has the lowest risk for adverse interactions. 2.  Avoid the quinolones (concomitant blockade of cardiac K+ channels).  Cipro has lower risk for QTc prolongation than other quinolones.  PCN's and      Cephalosporins may be alternative choice. 3.  Azithromycin and Erythromycin both may enhance QTc prolongation and should be avoided or used cautiously. 4.  Digoxin levels may be increased by interaction with Amiodarone. 5.  Coumadin and Amiodarone's interaction is a commonly recognized one.  Monitor INR's closely. 6.  Loratidine has dose-related effects on QTc prolongation.  Amiodarone can increase loratidine levels and increase the risk of Torsades.  Would suggest other antihistamines. 7.  Lasix may reduce K+ and Mg++ concentrations and increase the risk of ventricular arrhythmias.  (Also includes any K+-wasting diuretics, Ampho B,  Kayexalate, and excessive stimulant laxatives). 8.  Avoid Grapefruit juice.     Your patient currently does not have any of the above interacting medications.  We will continue to monitor.   If you have any specific questions or issues, please ask.   Thank you.   Doris Cheadle, PharmD Clinical Pharmacist Pager: [phone] Phone: [phone] 04/15/2012 10:25 AM

## 2012-04-15 NOTE — Anesthesia Postprocedure Evaluation (Signed)
  Anesthesia Post-op Note  Patient: [name]  Procedure(s) Performed: Procedure(s) (LRB): AORTIC VALVE REPLACEMENT (AVR) (N/A) THORACIC ASCENDING ANEURYSM REPAIR (AAA) (N/A)  Patient Location: SICU  Anesthesia Type: General  Level of Consciousness: awake, alert  and oriented  Airway and Oxygen Therapy: Patient Spontanous Breathing and Patient connected to face mask oxygen  Post-op Pain: moderate  Post-op Assessment: Post-op Vital signs reviewed, Patient's Cardiovascular Status Stable, Respiratory Function Stable, Patent Airway, No signs of Nausea or vomiting, Adequate PO intake and Pain level controlled  Post-op Vital Signs: Reviewed and stable  Complications: No apparent anesthesia complications

## 2012-04-15 NOTE — Progress Notes (Signed)
Physical Therapy Treatment Patient Details Name: [name] MRN: [id number] DOB: 1941/02/15 Today's Date: 04/15/2012 Time: 4098-1191 PT Time Calculation (min): 24 min  PT Assessment / Plan / Recommendation Comments on Treatment Session  Pt admitted s/p AVR and continues to progress.  Pt is very motivated and able to tolerate increased ambulation distance this am.  Will follow.    Follow Up Recommendations  Skilled nursing facility    Barriers to Discharge        Equipment Recommendations  Defer to next venue    Recommendations for Other Services    Frequency Min 3X/week   Plan Discharge plan remains appropriate;Frequency remains appropriate    Precautions / Restrictions Precautions Precautions: Sternal;Fall Precaution Comments: Re-educated on sternal precautions. Restrictions Weight Bearing Restrictions: No   Pertinent Vitals/Pain None    Mobility  Bed Mobility Bed Mobility: Not assessed Transfers Transfers: Sit to Stand;Stand to Sit Sit to Stand: 4: Min assist;Without upper extremity assist;From chair/3-in-1 Stand to Sit: 4: Min assist;Without upper extremity assist;To chair/3-in-1 Details for Transfer Assistance: Assist to translate trunk anterior over BOS due to pt maintaining sternal precautions by limiting use of bilateral UEs. Cues for sequence. Ambulation/Gait Ambulation/Gait Assistance: 1: +2 Total assist Ambulation/Gait: Patient Percentage: 80% Ambulation Distance (Feet): 100 Feet Assistive device: Other (Comment) (Pushing w/c due to increased lines/leads.) Ambulation/Gait Assistance Details: Assist for balance with chair follow due to fatigue.  Cues for tall posture extending at hips and trunk. Gait Pattern: Decreased stride length;Trunk flexed;Narrow base of support Stairs: No Wheelchair Mobility Wheelchair Mobility: No    Exercises     PT Diagnosis:    PT Problem List:   PT Treatment Interventions:     PT Goals Acute Rehab PT Goals PT Goal  Formulation: With patient Time For Goal Achievement: 04/24/12 Potential to Achieve Goals: Good PT Goal: Sit to Stand - Progress: Progressing toward goal PT Goal: Stand to Sit - Progress: Progressing toward goal PT Goal: Ambulate - Progress: Progressing toward goal  Visit Information  Last PT Received On: 04/15/12 Assistance Needed: +2 (Lines and leads.)    Subjective Data  Subjective: "I would love to walk with you." Patient Stated Goal: have surgery and go home   Cognition  Overall Cognitive Status: Appears within functional limits for tasks assessed/performed Arousal/Alertness: Awake/alert Orientation Level: Appears intact for tasks assessed Behavior During Session: [facility] for tasks performed    Balance  Balance Balance Assessed: No  End of Session PT - End of Session Equipment Utilized During Treatment: Gait belt Activity Tolerance: Patient tolerated treatment well Patient left: in chair;with call bell/phone within reach Nurse Communication: Mobility status   GP     Cephus Shelling 04/15/2012, 1:11 PM  04/15/2012 Cephus Shelling, PT, DPT [phone]

## 2012-04-15 NOTE — Progress Notes (Signed)
Patient ID: [name], female   DOB: [birth year], 71 y.o.   MRN: [id number]   Filed Vitals:   04/15/12 1600 04/15/12 1638 04/15/12 1700 04/15/12 1800  BP: 116/74  136/83 118/79  Pulse: 83  86 82  Temp:  97.6 F (36.4 C)    TempSrc:  Oral    Resp: 16  22 24   Height:      Weight:      SpO2: 96%  97% 98%   Foley is out. Has only voided 50cc so far.  Dopamine stopped and lasix stopped today.  Ambulated a little.  Continue present course.  Check bmet in am.

## 2012-04-15 NOTE — Progress Notes (Signed)
Patient ID: [name], female   DOB: [birth year], 71 y.o.   MRN: [id number] TCTS DAILY PROGRESS NOTE                   [address]            [place] [address]          [phone]      3 Days Post-Op Procedure(s) (LRB): AORTIC VALVE REPLACEMENT (AVR) (N/A) THORACIC ASCENDING ANEURYSM REPAIR (AAA) (N/A)  Total Length of Stay:  LOS: 9 days   Subjective: Feels better today, PT has seen  Objective: Vital signs in last 24 hours: Temp:  [97.6 F (36.4 C)-98.4 F (36.9 C)] 98.1 F (36.7 C) (07/18 0809) Pulse Rate:  [72-92] 92  (07/18 0700) Cardiac Rhythm:  [-] Atrial fibrillation (07/18 0752) Resp:  [13-30] 19  (07/18 0700) BP: (88-126)/(50-103) 114/75 mmHg (07/18 0700) SpO2:  [94 %-100 %] 94 % (07/18 0700) Weight:  [198 lb 10.2 oz (90.1 kg)] 198 lb 10.2 oz (90.1 kg) (07/18 0500)  Filed Weights   04/13/12 0645 04/14/12 0700 04/15/12 0500  Weight: 199 lb 11.8 oz (90.6 kg) 199 lb 8.3 oz (90.5 kg) 198 lb 10.2 oz (90.1 kg)    Weight change: -14.1 oz (-0.4 kg)   Hemodynamic parameters for last 24 hours:    Intake/Output from previous day: 07/17 0701 - 07/18 0700 In: 2018 [P.O.:480; I.V.:1522; IV Piggyback:16] Out: 1770 [Urine:1770]  Intake/Output this shift:    Current Meds: Scheduled Meds:   . acetaminophen  1,000 mg Oral Q6H   Or  . acetaminophen (TYLENOL) oral liquid 160 mg/5 mL  975 mg Per Tube Q6H  . allopurinol  100 mg Oral Daily  . aspirin EC  325 mg Oral Daily   Or  . aspirin  324 mg Per Tube Daily  . bisacodyl  10 mg Oral Daily   Or  . bisacodyl  10 mg Rectal Daily  . cefUROXime (ZINACEF)  IV  1.5 g Intravenous Q12H  . docusate sodium  200 mg Oral Daily  . furosemide  80 mg Intravenous Q12H  . insulin aspart  0-24 Units Subcutaneous Q4H  . metoprolol tartrate  12.5 mg Oral BID   Or  . metoprolol tartrate  12.5 mg Per Tube BID  . pantoprazole  40 mg Oral Q1200  . sodium chloride  3 mL Intravenous Q12H   Continuous Infusions:     . sodium chloride 20 mL/hr at 04/13/12 0800  . sodium chloride 0 mL/hr at 04/12/12 2030  . sodium chloride 250 mL (04/14/12 1530)  . amiodarone (NEXTERONE PREMIX) 360 mg/200 mL dextrose 30 mg/hr (04/15/12 0700)  . dexmedetomidine Stopped (04/13/12 0500)  . DOPamine 2.5 mcg/kg/min (04/15/12 0700)  . lactated ringers 40 mL/hr at 04/14/12 0700  . milrinone Stopped (04/13/12 1000)  . nitroGLYCERIN Stopped (04/12/12 1500)  . phenylephrine (NEO-SYNEPHRINE) Adult infusion Stopped (04/13/12 1600)  . DISCONTD: insulin (NOVOLIN-R) infusion Stopped (04/12/12 2201)   PRN Meds:.metoprolol, midazolam, morphine injection, ondansetron (ZOFRAN) IV, oxyCODONE, sodium chloride  General appearance: alert, cooperative and no distress Neurologic: intact Heart: regular rate and rhythm, S1, S2 normal, no murmur, click, rub or gallop and normal apical impulse Lungs: diminished breath sounds bibasilar Abdomen: soft, non-tender; bowel sounds normal; no masses,  no organomegaly Extremities: extremities normal, atraumatic, no cyanosis or edema and Homans sign is negative, no sign of DVT Wound: stable sternum  Lab Results: CBC: Basename 04/15/12 0410 04/14/12 0335  WBC 14.2*  17.5*  HGB 8.9* 9.0*  HCT 26.9* 27.6*  PLT 101* 78*   BMET:  Basename 04/15/12 0410 04/14/12 0335  NA 137 133*  K 3.9 4.2  CL 101 99  CO2 23 22  GLUCOSE 97 117*  BUN 25* 16  CREATININE 1.70* 1.43*  CALCIUM 8.4 8.5    PT/INR:  Basename 04/14/12 0335  LABPROT 18.7*  INR 1.53*   Radiology: Dg Chest Port 1 View  04/15/2012  *RADIOLOGY REPORT*  Clinical Data: Postop aorta repair.  Chest tube.  PORTABLE CHEST - 1 VIEW  Comparison: 04/14/2012.  Findings: Trachea is midline.  Right IJ catheter sheath projects over the SVC.  Cardiomediastinal silhouette is grossly stable in size and appearance.  Thoracic aorta is calcified.  There are seven intact sternotomy wires, unchanged in position.  ICD lead tip projects over the right  ventricle. Epicardial pacer wires are in place.  Lungs are somewhat low in volume with mild diffuse bilateral air space disease, minimally improved from 04/14/2012. Left lower lobe collapse/consolidation persists.  No definite pneumothorax.  Right pleural effusion is less apparent than on 04/14/2012.  IMPRESSION:  1.  Mild edema, minimally improved from 04/14/2012. 2.  Right pleural effusion is less apparent. 3.  Persistent left lower lobe collapse/consolidation. 4.  No definite pneumothorax.  Original Report Authenticated By: Reyes Ivan, M.D.   Dg Chest Portable 1 View In Am  04/14/2012  *RADIOLOGY REPORT*  Clinical Data: Left base atelectasis.  Aortic valve replacement and thoracic ascending aortic aneurysm repair.  PORTABLE CHEST - 1 VIEW  Comparison: 04/13/2012  Findings: Endotracheal tube and Swan-Ganz catheter have been removed.  Right jugular venous sheath remains with the tip in the superior vena cava.  Transvenous defibrillator is unchanged.  The patient has a new small right effusion.  Persistent consolidation/atelectasis in the left lower lobe with probable left effusion. No pneumothorax.  IMPRESSION:  1.  New small right pleural effusion. 2.  No other significant change other than removal of the ET tube and Swan-Ganz catheter.  Original Report Authenticated By: Gwynn Burly, M.D.     Assessment/Plan: S/P Procedure(s) (LRB): AORTIC VALVE REPLACEMENT (AVR) (N/A) THORACIC ASCENDING ANEURYSM REPAIR (AAA) (N/A) Mobilize Diuresis cr increased to 1.7 , hold lasix today Wean dopamine off Convert to po cordarone Monitor UOP and CR     Delight Ovens MD  Beeper [phone] Office [phone] 04/15/2012 8:23 AM

## 2012-04-15 NOTE — Progress Notes (Signed)
INITIAL ADULT NUTRITION ASSESSMENT Date: 04/15/2012   Time: 10:57 AM  INTERVENTION:  Resource Breeze supplement 3 times daily between meals (250 kcals, 9 gm protein per 8 fl oz carton)  Prostat liquid protein 30 ml PO 3 times daily with meals (100 kcals, 15 gm protein per dose) RD to follow for nutrition care plan  Reason for Assessment: poor PO intake  ASSESSMENT: Female 71 y.o.  Dx: Severe aortic insufficiency with severely depressed left ventricular function & dilated ascending aorta   Hx:  Past Medical History  Diagnosis Date  . History of ventricular tachycardia   . Peripheral edema   . Aortic insufficiency     Moderate 2012  . History of gout   . Exogenous obesity   . History of CHF (congestive heart failure)     EF 45% 2012, nonischemic (Normal coronaries 2002)  . 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 Hypertension   . 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 ICD (implantable cardiac defibrillator) in place 2012  . DOE (dyspnea on exertion)   . Stroke 2000  . Renal insufficiency   . Myocardial infarction 2000  . Anxiety   . Heart murmur   . Anemia   . Presence of IVC filter 2000    Related Meds:     . acetaminophen  1,000 mg Oral Q6H   Or  . acetaminophen (TYLENOL) oral liquid 160 mg/5 mL  975 mg Per Tube Q6H  . allopurinol  100 mg Oral Daily  . amiodarone  200 mg Oral Q12H   Followed by  . amiodarone  200 mg Oral Daily  . aspirin EC  325 mg Oral Daily   Or  . aspirin  324 mg Per Tube Daily  . bisacodyl  10 mg Oral Daily   Or  . bisacodyl  10 mg Rectal Daily  . docusate sodium  200 mg Oral Daily  . furosemide  80 mg Intravenous Q12H  . insulin aspart  0-24 Units Subcutaneous Q4H  . metoprolol tartrate  12.5 mg Oral BID   Or  . metoprolol tartrate  12.5 mg Per Tube BID  . pantoprazole  40 mg Oral Q1200  . sodium chloride  3 mL Intravenous Q12H    Ht: 5\' 2"  (157.5 cm)  Wt: 198 lb  (90.1 kg)  Ideal Wt: 50 kg % Ideal Wt: 180%  Usual Wt: 178 lb -- per office visit record June 2013 % Usual Wt: 111%  Body mass index is 36.33 kg/(m^2).  Food/Nutrition Related Hx: no triggers per admission nutrition screen  Labs:  CMP     Component Value Date/Time   NA 137 04/15/2012 0410   K 3.9 04/15/2012 0410   CL 101 04/15/2012 0410   CO2 23 04/15/2012 0410   GLUCOSE 97 04/15/2012 0410   BUN 25* 04/15/2012 0410   CREATININE 1.70* 04/15/2012 0410   CREATININE 1.39* 02/13/2012 0919   CALCIUM 8.4 04/15/2012 0410   PROT 7.2 04/06/2012 1730   ALBUMIN 3.6 04/06/2012 1730   AST 23 04/06/2012 1730   ALT 12 04/06/2012 1730   ALKPHOS 94 04/06/2012 1730   BILITOT 0.3 04/06/2012 1730   GFRNONAA 29* 04/15/2012 0410   GFRAA 34* 04/15/2012 0410     Intake/Output Summary (Last 24 hours) at 04/15/12 1436 Last data filed at 04/15/12 1300  Gross per 24 hour  Intake 1515.65 ml  Output   1495 ml  Net  20.65 ml  CBG (last 3)   Basename 04/15/12 1207 04/15/12 0807 04/15/12 0401  GLUCAP 99 91 99    Diet Order: Carbohydrate Modified Medium Calorie  Supplements/Tube Feeding: N/A  IVF:    sodium chloride Last Rate: 20 mL/hr at 04/13/12 0800  sodium chloride Last Rate: 0 mL/hr at 04/12/12 2030  sodium chloride Last Rate: 250 mL (04/14/12 1530)  lactated ringers Last Rate: 40 mL/hr at 04/14/12 0700  DISCONTD: amiodarone (NEXTERONE PREMIX) 360 mg/200 mL dextrose Last Rate: 30 mg/hr (04/15/12 0700)  DISCONTD: dexmedetomidine Last Rate: Stopped (04/13/12 0500)  DISCONTD: DOPamine Last Rate: 1.5 mcg/kg/min (04/15/12 0900)  DISCONTD: insulin (NOVOLIN-R) infusion Last Rate: Stopped (04/12/12 2201)  DISCONTD: milrinone Last Rate: Stopped (04/13/12 1000)  DISCONTD: nitroGLYCERIN Last Rate: Stopped (04/12/12 1500)  DISCONTD: phenylephrine (NEO-SYNEPHRINE) Adult infusion Last Rate: Stopped (04/13/12 1600)    Estimated Nutritional Needs:   Kcal: 1800-2000 Protein: 90-100 gm Fluid: 1.8-2.0 L  Patient  admitted 7/9 for cardiac catheterization and further preparation for surgery; s/p aortic valve replacement 7/16; reports her appetite is rather poor; PO intake at 25-40% per flowsheet records; states she's gained weight given fluid; + large chest incision; would benefit from addition of nutrition supplement -- amenable to [employer] -- RD to order.  NUTRITION DIAGNOSIS: -Inadequate oral intake (NI-2.1).  Status: Ongoing  RELATED TO: poor appetite  AS EVIDENCE BY: PO intake 25-40%  MONITORING/EVALUATION(Goals): Goal: Oral intake with meals & supplements to meet >/= 90% of estimated nutrition needs Monitor: PO intake, supplement acceptance, weight, labs, I/O's  EDUCATION NEEDS: -No education needs identified at this time  Dietitian #: [phone]  DOCUMENTATION CODES Per approved criteria  -Obesity Unspecified    Alger Memos 04/15/2012, 10:57 AM

## 2012-04-16 ENCOUNTER — Inpatient Hospital Stay (HOSPITAL_COMMUNITY): Payer: Medicare Other

## 2012-04-16 ENCOUNTER — Encounter (HOSPITAL_COMMUNITY): Payer: Self-pay | Admitting: Cardiothoracic Surgery

## 2012-04-16 LAB — CBC
HCT: 25.4 % — ABNORMAL LOW (ref 36.0–46.0)
Hemoglobin: 8.7 g/dL — ABNORMAL LOW (ref 12.0–15.0)
MCH: 30.9 pg (ref 26.0–34.0)
MCHC: 34.3 g/dL (ref 30.0–36.0)
MCV: 90.1 fL (ref 78.0–100.0)
Platelets: 111 10*3/uL — ABNORMAL LOW (ref 150–400)
RBC: 2.82 MIL/uL — ABNORMAL LOW (ref 3.87–5.11)
RDW: 15.6 % — ABNORMAL HIGH (ref 11.5–15.5)
WBC: 8.9 10*3/uL (ref 4.0–10.5)

## 2012-04-16 LAB — BASIC METABOLIC PANEL
BUN: 39 mg/dL — ABNORMAL HIGH (ref 6–23)
CO2: 22 mEq/L (ref 19–32)
Calcium: 8.1 mg/dL — ABNORMAL LOW (ref 8.4–10.5)
Chloride: 101 mEq/L (ref 96–112)
Creatinine, Ser: 1.97 mg/dL — ABNORMAL HIGH (ref 0.50–1.10)
GFR calc Af Amer: 28 mL/min — ABNORMAL LOW (ref >90)
GFR calc non Af Amer: 25 mL/min — ABNORMAL LOW (ref >90)
Glucose, Bld: 90 mg/dL (ref 70–99)
Potassium: 3.5 mEq/L (ref 3.5–5.1)
Sodium: 135 mEq/L (ref 135–145)

## 2012-04-16 LAB — PROTIME-INR
INR: 1.4 (ref 0.00–1.49)
Prothrombin Time: 17.4 seconds — ABNORMAL HIGH (ref 11.6–15.2)

## 2012-04-16 MED ORDER — CARVEDILOL 3.125 MG PO TABS
3.1250 mg | ORAL_TABLET | Freq: Two times a day (BID) | ORAL | Status: DC
  Administered 2012-04-16 – 2012-04-22 (×12): 3.125 mg via ORAL
  Filled 2012-04-16 (×15): qty 1

## 2012-04-16 MED ORDER — SODIUM CHLORIDE 0.9 % IJ SOLN
10.0000 mL | INTRAMUSCULAR | Status: DC | PRN
  Administered 2012-04-16 – 2012-04-17 (×2): 10 mL

## 2012-04-16 MED ORDER — FUROSEMIDE 10 MG/ML IJ SOLN
80.0000 mg | Freq: Two times a day (BID) | INTRAMUSCULAR | Status: DC
  Administered 2012-04-16 (×2): 80 mg via INTRAVENOUS
  Filled 2012-04-16 (×4): qty 8

## 2012-04-16 MED ORDER — ASPIRIN EC 81 MG PO TBEC
81.0000 mg | DELAYED_RELEASE_TABLET | Freq: Every day | ORAL | Status: DC
  Administered 2012-04-16 – 2012-04-22 (×7): 81 mg via ORAL
  Filled 2012-04-16 (×7): qty 1

## 2012-04-16 MED ORDER — ASPIRIN 81 MG PO CHEW: 324.0000 mg | CHEWABLE_TABLET | Freq: Every day | ORAL | Status: DC

## 2012-04-16 MED ORDER — WARFARIN SODIUM 2.5 MG PO TABS
2.5000 mg | ORAL_TABLET | Freq: Once | ORAL | Status: AC
  Administered 2012-04-16: 2.5 mg via ORAL
  Filled 2012-04-16: qty 1

## 2012-04-16 MED ORDER — WARFARIN - PHYSICIAN DOSING INPATIENT
Freq: Every day | Status: DC
  Administered 2012-04-16 – 2012-04-20 (×2)

## 2012-04-16 MED ORDER — SODIUM CHLORIDE 0.9 % IJ SOLN
10.0000 mL | Freq: Two times a day (BID) | INTRAMUSCULAR | Status: DC
  Administered 2012-04-16: 10 mL
  Administered 2012-04-17 – 2012-04-18 (×2): 20 mL

## 2012-04-16 NOTE — Progress Notes (Addendum)
[address]            [place] [address]          [phone]     4 Days Post-Op Procedure(s) (LRB): AORTIC VALVE REPLACEMENT (AVR) (N/A) THORACIC ASCENDING ANEURYSM REPAIR (AAA) (N/A)  Subjective: OOB in chair.  Feels well, incision feels "tight".  Objective: Vital signs in last 24 hours: Patient Vitals for the past 24 hrs:  BP Temp Temp src Pulse Resp SpO2  04/16/12 0800 116/81 mmHg - - 60  15  97 %  04/16/12 0735 - 98 F (36.7 C) Oral - - -  04/16/12 0700 125/97 mmHg - - 74  22  96 %  04/16/12 0600 115/87 mmHg - - 70  14  97 %  04/16/12 0500 - - - 76  19  98 %  04/16/12 0400 109/87 mmHg 97.5 F (36.4 C) Oral 75  14  96 %  04/16/12 0300 108/83 mmHg - - 71  15  97 %  04/16/12 0200 99/74 mmHg - - 72  19  97 %  04/16/12 0100 91/65 mmHg - - 68  15  97 %  04/16/12 0000 105/67 mmHg 97.9 F (36.6 C) Oral 50  16  97 %  04/15/12 2300 116/90 mmHg - - 71  20  94 %  04/15/12 2200 118/73 mmHg - - 46  22  97 %  04/15/12 2100 107/74 mmHg - - 72  23  97 %  04/15/12 2006 108/73 mmHg - - 74  20  97 %  04/15/12 1956 - 97.5 F (36.4 C) Axillary - - -  04/15/12 1900 - - - 86  21  97 %  04/15/12 1800 118/79 mmHg - - 82  24  98 %  04/15/12 1700 136/83 mmHg - - 86  22  97 %  04/15/12 1638 - 97.6 F (36.4 C) Oral - - -  04/15/12 1600 116/74 mmHg - - 83  16  96 %  04/15/12 1500 111/83 mmHg - - 86  18  96 %  04/15/12 1421 - - - 87  24  96 %  04/15/12 1400 136/88 mmHg - - 85  24  97 %  04/15/12 1345 129/84 mmHg - - 86  26  97 %  04/15/12 1300 106/78 mmHg - - 82  18  96 %  04/15/12 1209 - 97.2 F (36.2 C) Oral - - -  04/15/12 1200 105/76 mmHg - - 83  29  95 %  04/15/12 1100 113/72 mmHg - - 85  29  97 %  04/15/12 1045 103/73 mmHg - - 86  31  97 %  04/15/12 1015 105/73 mmHg - - 88  21  96 %  04/15/12 1000 102/66 mmHg - - 88  25  95 %  04/15/12 0945 98/70 mmHg - - 90  24  94 %  04/15/12 0930 117/74 mmHg - - 90  23  95 %  04/15/12 0915 109/78 mmHg - -  92  23  95 %  04/15/12 0900 128/102 mmHg - - 96  27  92 %  04/15/12 0830 111/73 mmHg - - 57  17  97 %   Current Weight  04/15/12 198 lb 10.2 oz (90.1 kg)  PRE-OPERATIVE WEIGHT: 79.5kg    Intake/Output from previous day: 07/18 0701 - 07/19 0700 In: 1640.5 [P.O.:1060; I.V.:580.5]  Out: 977 [Urine:975; Stool:2]    PHYSICAL EXAM:  Heart: Irr irr Lungs: clear Wound: clean and dry Extremities: +LE edema   Lab Results: CBC: Basename 04/16/12 0500 04/15/12 0410  WBC 8.9 14.2*  HGB 8.7* 8.9*  HCT 25.4* 26.9*  PLT 111* 101*   BMET:  Basename 04/16/12 0500 04/15/12 0410  NA 135 137  K 3.5 3.9  CL 101 101  CO2 22 23  GLUCOSE 90 97  BUN 39* 25*  CREATININE 1.97* 1.70*  CALCIUM 8.1* 8.4    PT/INR:  Basename 04/16/12 0500  LABPROT 17.4*  INR 1.40   CXR:  Findings:  Right jugular catheter with tip projecting over SVC.  Left subclavian AICD lead projects over right ventricle.  Enlargement of cardiac silhouette post median sternotomy and AVR.  Numerous cardiac monitoring lines project over chest.  Left basilar atelectasis versus consolidation.  Minimal right base atelectasis.  Remaining lungs clear.  Dense atherosclerotic calcification aorta.  No pneumothorax.  IMPRESSION:  No interval change.    Assessment/Plan: S/P Procedure(s) (LRB): AORTIC VALVE REPLACEMENT (AVR) (N/A) THORACIC ASCENDING ANEURYSM REPAIR (AAA) (N/A)  CV- AF, rate controlled.  Continue Amio, Lopressor.    Acute renal insufficiency- Cr trending up, UOP down.  Lasix d/c'ed and off Dopamine. Wt up and edematous.  Looks like she might need further diuresis.  Deconditioning- Continue PT, ambulation.  ?Tx stepdown today.  LOS: 10 days    COLLINS,GINA H 04/16/2012   cr increased 1.9, 975 ml 24 hour uop Place pic line Continue PT aterila clip applied at time of surgery Was on xarlito preop with renal insufficiency will put back on coumadin for a fib I have seen and examined [employer] and agree with the above assessment  and plan.  Delight Ovens MD Beeper [phone] Office [phone] 04/16/2012 9:51 AM

## 2012-04-16 NOTE — Progress Notes (Signed)
Peripherally Inserted Central Catheter/Midline Placement  The IV Nurse has discussed with the patient and/or persons authorized to consent for the patient, the purpose of this procedure and the potential benefits and risks involved with this procedure.  The benefits include less needle sticks, lab draws from the catheter and patient may be discharged home with the catheter.  Risks include, but not limited to, infection, bleeding, blood clot (thrombus formation), and puncture of an artery; nerve damage and irregular heat beat.  Alternatives to this procedure were also discussed.  PICC/Midline Placement Documentation        [name] 04/16/2012, 2:51 PM

## 2012-04-16 NOTE — Progress Notes (Signed)
PT Cancellation Note  Treatment cancelled today due to politely declined PT this am due to increased fatigue and having already walked with RN.  To follow.  Earnest Mcgillis M 04/16/2012, 12:36 PM  04/16/2012 Cephus Shelling, PT, DPT [phone]

## 2012-04-16 NOTE — Progress Notes (Signed)
Patient ID: [name], female   DOB: October 31, 1940, 71 y.o.   MRN: [id number] POD # 4 AVR, ascending aneurysm repair  BP 116/87  Pulse 57  Temp 97.5 F (36.4 C) (Oral)  Resp 34  Ht 5\' 2"  (1.575 m)  Wt 198 lb 10.2 oz (90.1 kg)  BMI 36.33 kg/m2  SpO2 97%   Intake/Output Summary (Last 24 hours) at 04/16/12 1649 Last data filed at 04/16/12 1500  Gross per 24 hour  Intake   1468 ml  Output   1253 ml  Net    215 ml    Moderate response to lasix PICC in place  Continue current care

## 2012-04-16 NOTE — Progress Notes (Signed)
PROGRESS NOTE  Subjective:   Pt is s/p AVR and thoracic ascending aneurysm repair.  Hx of chronic AF.  Remote hx of intracerebral hemorrhage.  She is resting comfortable in the chair this am.   Objective:    Vital Signs:   Temp:  [97.2 F (36.2 C)-98.1 F (36.7 C)] 97.5 F (36.4 C) (07/19 0400) Pulse Rate:  [46-96] 74  (07/19 0700) Resp:  [14-31] 22  (07/19 0700) BP: (91-136)/(65-102) 125/97 mmHg (07/19 0700) SpO2:  [92 %-98 %] 96 % (07/19 0700)  Last BM Date: 04/10/12   24-hour weight change: Weight change:   Weight trends: Filed Weights   04/13/12 0645 April 16, 2012 0700 04/15/12 0500  Weight: 199 lb 11.8 oz (90.6 kg) 199 lb 8.3 oz (90.5 kg) 198 lb 10.2 oz (90.1 kg)    Intake/Output:  07/18 0701 - 07/19 0700 In: 1640.5 [P.O.:1060; I.V.:580.5] Out: 977 [Urine:975; Stool:2]     Physical Exam: BP 125/97  Pulse 74  Temp 97.5 F (36.4 C) (Oral)  Resp 22  Ht 5\' 2"  (1.575 m)  Wt 198 lb 10.2 oz (90.1 kg)  BMI 36.33 kg/m2  SpO2 96%  General: Vital signs reviewed and noted. Well-developed, well-nourished, in no acute distress; alert, appropriate and cooperative   sleepy.  Head: Normocephalic, atraumatic.  Eyes: conjunctivae/corneas clear.  EOM's intact.   Throat: normal  Neck: Supple. Normal carotids. No JVD  Lungs:  Clear to auscultation anteriorly  Heart: Irregularly irregular  Abdomen:  Soft, non-tender, non-distended with normoactive bowel sounds. No hepatomegaly. No rebound/guarding. No abdominal masses.  Extremities:  .  No edema.    Neurologic: A&O X3, CN II - XII are grossly intact. Motor strength is 5/5 in the all 4 extremities.  Psych: Responds to questions appropriately with normal affect.    Labs: BMET:  Basename 04/16/12 0500 04/15/12 0410 04/13/12 1700  NA 135 137 --  K 3.5 3.9 --  CL 101 101 --  CO2 22 23 --  GLUCOSE 90 97 --  BUN 39* 25* --  CREATININE 1.97* 1.70* --  CALCIUM 8.1* 8.4 --  MG -- -- 2.7*  PHOS -- -- --    Liver function  tests: No results found for this basename: AST:2,ALT:2,ALKPHOS:2,BILITOT:2,PROT:2,ALBUMIN:2 in the last 72 hours No results found for this basename: LIPASE:2,AMYLASE:2 in the last 72 hours  CBC:  Basename 04/16/12 0500 04/15/12 0410  WBC 8.9 14.2*  NEUTROABS -- --  HGB 8.7* 8.9*  HCT 25.4* 26.9*  MCV 90.1 92.1  PLT 111* 101*    Cardiac Enzymes: No results found for this basename: CKTOTAL:4,CKMB:4,TROPONINI:4 in the last 72 hours  Coagulation Studies:  Basename 04/16/12 0500 04/16/12 0335  LABPROT 17.4* 18.7*  INR 1.40 1.53*    Other: No components found with this basename: POCBNP:3 No results found for this basename: DDIMER in the last 72 hours No results found for this basename: HGBA1C in the last 72 hours No results found for this basename: CHOL,HDL,LDLCALC,TRIG,CHOLHDL in the last 72 hours No results found for this basename: TSH,T4TOTAL,FREET3,T3FREE,THYROIDAB in the last 72 hours No results found for this basename: VITAMINB12,FOLATE,FERRITIN,TIBC,IRON,RETICCTPCT in the last 72 hours    Tele:  A-Fib  Medications:    Infusions:    . sodium chloride 20 mL/hr at 04/13/12 0800  . sodium chloride 0 mL/hr at 04/12/12 2030  . sodium chloride 250 mL (16-Apr-2012 1530)  . lactated ringers 40 mL/hr at 04-16-12 0700  . DISCONTD: amiodarone (NEXTERONE PREMIX) 360 mg/200 mL dextrose 30 mg/hr (04/15/12 0700)  .  DISCONTD: dexmedetomidine Stopped (04/13/12 0500)  . DISCONTD: DOPamine 1.5 mcg/kg/min (04/15/12 0900)  . DISCONTD: milrinone Stopped (04/13/12 1000)  . DISCONTD: nitroGLYCERIN Stopped (04/12/12 1500)  . DISCONTD: phenylephrine (NEO-SYNEPHRINE) Adult infusion Stopped (04/13/12 1600)    Scheduled Medications:    . acetaminophen  1,000 mg Oral Q6H   Or  . acetaminophen (TYLENOL) oral liquid 160 mg/5 mL  975 mg Per Tube Q6H  . allopurinol  100 mg Oral Daily  . amiodarone  200 mg Oral Q12H   Followed by  . amiodarone  200 mg Oral Daily  . aspirin EC  325 mg Oral  Daily   Or  . aspirin  324 mg Per Tube Daily  . bisacodyl  10 mg Oral Daily   Or  . bisacodyl  10 mg Rectal Daily  . docusate sodium  200 mg Oral Daily  . feeding supplement  30 mL Oral TID WC  . feeding supplement  1 Container Oral TID BM  . metoprolol tartrate  12.5 mg Oral BID   Or  . metoprolol tartrate  12.5 mg Per Tube BID  . pantoprazole  40 mg Oral Q1200  . sodium chloride  3 mL Intravenous Q12H  . DISCONTD: insulin aspart  0-24 Units Subcutaneous Q4H    Assessment/ Plan:    Atrial fibrillation (01/02/2009) staqble  S/p AVR and repair of thoracic aortic aneurism.  Doing well  Disposition: continue current meds.  Hopefully will be able to transfer out of unit today. Length of Stay: 10  Vesta Mixer, Montez Hageman., MD, [facility] 04/16/2012, 7:24 AM Office [phone] Pager [phone]

## 2012-04-17 LAB — CBC
HCT: 25.8 % — ABNORMAL LOW (ref 36.0–46.0)
Hemoglobin: 8.7 g/dL — ABNORMAL LOW (ref 12.0–15.0)
MCH: 29.9 pg (ref 26.0–34.0)
MCHC: 33.7 g/dL (ref 30.0–36.0)
MCV: 88.7 fL (ref 78.0–100.0)
Platelets: 151 10*3/uL (ref 150–400)
RBC: 2.91 MIL/uL — ABNORMAL LOW (ref 3.87–5.11)
RDW: 15.4 % (ref 11.5–15.5)
WBC: 7.4 10*3/uL (ref 4.0–10.5)

## 2012-04-17 LAB — PROTIME-INR
INR: 1.61 — ABNORMAL HIGH (ref 0.00–1.49)
Prothrombin Time: 19.4 seconds — ABNORMAL HIGH (ref 11.6–15.2)

## 2012-04-17 LAB — BASIC METABOLIC PANEL
BUN: 47 mg/dL — ABNORMAL HIGH (ref 6–23)
CO2: 25 mEq/L (ref 19–32)
Calcium: 8.2 mg/dL — ABNORMAL LOW (ref 8.4–10.5)
Chloride: 101 mEq/L (ref 96–112)
Creatinine, Ser: 1.86 mg/dL — ABNORMAL HIGH (ref 0.50–1.10)
GFR calc Af Amer: 31 mL/min — ABNORMAL LOW (ref >90)
GFR calc non Af Amer: 26 mL/min — ABNORMAL LOW (ref >90)
Glucose, Bld: 98 mg/dL (ref 70–99)
Potassium: 3.1 mEq/L — ABNORMAL LOW (ref 3.5–5.1)
Sodium: 138 mEq/L (ref 135–145)

## 2012-04-17 MED ORDER — POTASSIUM CHLORIDE 10 MEQ/50ML IV SOLN
10.0000 meq | INTRAVENOUS | Status: AC
  Administered 2012-04-17 (×2): 10 meq via INTRAVENOUS

## 2012-04-17 MED ORDER — POTASSIUM CHLORIDE CRYS ER 20 MEQ PO TBCR
20.0000 meq | EXTENDED_RELEASE_TABLET | Freq: Once | ORAL | Status: AC
  Administered 2012-04-17: 20 meq via ORAL
  Filled 2012-04-17: qty 1

## 2012-04-17 MED ORDER — WARFARIN SODIUM 2.5 MG PO TABS
2.5000 mg | ORAL_TABLET | Freq: Once | ORAL | Status: AC
  Administered 2012-04-17: 2.5 mg via ORAL
  Filled 2012-04-17: qty 1

## 2012-04-17 MED ORDER — FUROSEMIDE 10 MG/ML IJ SOLN
80.0000 mg | Freq: Two times a day (BID) | INTRAMUSCULAR | Status: DC
  Administered 2012-04-17 – 2012-04-18 (×3): 80 mg via INTRAVENOUS
  Filled 2012-04-17 (×5): qty 8

## 2012-04-17 NOTE — Progress Notes (Signed)
K+= 3.1 and creat= 1.86 w/ urine o/p > 30cc/hr; TCTS KCL protocol initiated. 2 runs of will be infused and then referred to the surgeon on-call d/t the elevated creat. and CrCl of 27.

## 2012-04-17 NOTE — Progress Notes (Signed)
5 Days Post-Op Procedure(s) (LRB): AORTIC VALVE REPLACEMENT (AVR) (N/A) THORACIC ASCENDING ANEURYSM REPAIR (AAA) (N/A) Subjective: C/o lack of sleep due to frequent urination overnight Did well with ambulation this AM  Objective: Vital signs in last 24 hours: Temp:  [97.5 F (36.4 C)-97.8 F (36.6 C)] 97.8 F (36.6 C) (07/20 0803) Pulse Rate:  [57-86] 80  (07/20 0800) Cardiac Rhythm:  [-] Atrial fibrillation (07/20 0800) Resp:  [15-36] 25  (07/20 0800) BP: (91-169)/(63-98) 123/81 mmHg (07/20 0800) SpO2:  [92 %-100 %] 99 % (07/20 0800) Weight:  [194 lb 7.1 oz (88.2 kg)] 194 lb 7.1 oz (88.2 kg) (07/20 0400)  Hemodynamic parameters for last 24 hours:    Intake/Output from previous day: 07/19 0701 - 07/20 0700 In: 1216 [P.O.:960; I.V.:140; IV Piggyback:116] Out: 2401 [Urine:2400; Stool:1] Intake/Output this shift:    General appearance: alert and no distress Neurologic: intact Heart: irregularly irregular rhythm Lungs: clear to auscultation bilaterally Extremities: edema 1+  Lab Results:  Basename 04/17/12 0336 04/16/12 0500  WBC 7.4 8.9  HGB 8.7* 8.7*  HCT 25.8* 25.4*  PLT 151 111*   BMET:  Basename 04/17/12 0336 04/16/12 0500  NA 138 135  K 3.1* 3.5  CL 101 101  CO2 25 22  GLUCOSE 98 90  BUN 47* 39*  CREATININE 1.86* 1.97*  CALCIUM 8.2* 8.1*    PT/INR:  Basename 04/17/12 0336  LABPROT 19.4*  INR 1.61*   ABG    Component Value Date/Time   PHART 7.396 04/13/2012 0951   HCO3 21.2 04/13/2012 0951   TCO2 20 04/13/2012 1710   ACIDBASEDEF 3.0* 04/13/2012 0951   O2SAT 98.0 04/13/2012 0951   CBG (last 3)   Basename 04/15/12 1636 04/15/12 1207 04/15/12 0807  GLUCAP 87 99 91    Assessment/Plan: S/P Procedure(s) (LRB): AORTIC VALVE REPLACEMENT (AVR) (N/A) THORACIC ASCENDING ANEURYSM REPAIR (AAA) (N/A) - CV- remains in rate controlled A fib. INR up to 1.6 continue coumadin  RESP- stable  RENAL UO up and creatinine down overnight, c/w resolving  ATN  Change timing of lasix  Hypokalemia- replete K  ENDO- CBG well controlled  Continue ambulation   LOS: 11 days    Ameerah Huffstetler C 04/17/2012

## 2012-04-17 NOTE — Progress Notes (Signed)
Comfortable  BP 103/78  Pulse 80  Temp 97.5 F (36.4 C) (Oral)  Resp 23  Ht 5\' 2"  (1.575 m)  Wt 194 lb 7.1 oz (88.2 kg)  BMI 35.56 kg/m2  SpO2 100%   Intake/Output Summary (Last 24 hours) at 04/17/12 1727 Last data filed at 04/17/12 1400  Gross per 24 hour  Intake   1128 ml  Output   2600 ml  Net  -1472 ml    Diuresing well

## 2012-04-18 LAB — CBC
Hemoglobin: 8.5 g/dL — ABNORMAL LOW (ref 12.0–15.0)
MCH: 29.7 pg (ref 26.0–34.0)
MCV: 89.2 fL (ref 78.0–100.0)
RBC: 2.86 MIL/uL — ABNORMAL LOW (ref 3.87–5.11)

## 2012-04-18 LAB — BASIC METABOLIC PANEL
BUN: 51 mg/dL — ABNORMAL HIGH (ref 6–23)
CO2: 28 mEq/L (ref 19–32)
Chloride: 103 mEq/L (ref 96–112)
Creatinine, Ser: 1.49 mg/dL — ABNORMAL HIGH (ref 0.50–1.10)

## 2012-04-18 MED ORDER — ALPRAZOLAM 0.5 MG PO TABS: 0.5000 mg | ORAL_TABLET | Freq: Three times a day (TID) | ORAL | Status: DC | PRN

## 2012-04-18 MED ORDER — GUAIFENESIN-DM 100-10 MG/5ML PO SYRP: 15.0000 mL | ORAL_SOLUTION | ORAL | Status: DC | PRN

## 2012-04-18 MED ORDER — MAGNESIUM HYDROXIDE 400 MG/5ML PO SUSP
30.0000 mL | Freq: Every day | ORAL | Status: DC | PRN
  Administered 2012-04-21: 30 mL via ORAL
  Filled 2012-04-18 (×2): qty 30

## 2012-04-18 MED ORDER — POTASSIUM CHLORIDE CRYS ER 20 MEQ PO TBCR
40.0000 meq | EXTENDED_RELEASE_TABLET | Freq: Two times a day (BID) | ORAL | Status: AC
  Administered 2012-04-18: 40 meq via ORAL
  Filled 2012-04-18 (×2): qty 2

## 2012-04-18 MED ORDER — ALUM & MAG HYDROXIDE-SIMETH 200-200-20 MG/5ML PO SUSP: 15.0000 mL | ORAL | Status: DC | PRN

## 2012-04-18 MED ORDER — FUROSEMIDE 40 MG PO TABS
40.0000 mg | ORAL_TABLET | Freq: Every day | ORAL | Status: DC
  Filled 2012-04-18 (×2): qty 1

## 2012-04-18 MED ORDER — SODIUM CHLORIDE 0.9 % IV SOLN: 250.0000 mL | INTRAVENOUS | Status: DC | PRN

## 2012-04-18 MED ORDER — SODIUM CHLORIDE 0.9 % IJ SOLN: 3.0000 mL | INTRAMUSCULAR | Status: DC | PRN

## 2012-04-18 MED ORDER — WARFARIN SODIUM 2.5 MG PO TABS
2.5000 mg | ORAL_TABLET | Freq: Every day | ORAL | Status: DC
  Filled 2012-04-18 (×2): qty 1

## 2012-04-18 MED ORDER — POTASSIUM CHLORIDE 10 MEQ/50ML IV SOLN
INTRAVENOUS | Status: AC
  Administered 2012-04-18: 07:00:00
  Filled 2012-04-18: qty 50

## 2012-04-18 MED ORDER — SODIUM CHLORIDE 0.9 % IJ SOLN
3.0000 mL | Freq: Two times a day (BID) | INTRAMUSCULAR | Status: DC
  Administered 2012-04-19 – 2012-04-21 (×3): 3 mL via INTRAVENOUS

## 2012-04-18 MED ORDER — MOVING RIGHT ALONG BOOK
Freq: Once | Status: DC
  Filled 2012-04-18: qty 1

## 2012-04-18 NOTE — Progress Notes (Signed)
6 Days Post-Op Procedure(s) (LRB): AORTIC VALVE REPLACEMENT (AVR) (N/A) THORACIC ASCENDING ANEURYSM REPAIR (AAA) (N/A) Subjective: No complaints this AM  Objective: Vital signs in last 24 hours: Temp:  [97.5 F (36.4 C)-98.2 F (36.8 C)] 97.9 F (36.6 C) (07/21 0823) Pulse Rate:  [32-114] 80  (07/21 0800) Cardiac Rhythm:  [-] Atrial fibrillation (07/21 0800) Resp:  [15-35] 20  (07/21 0800) BP: (95-152)/(65-99) 152/89 mmHg (07/21 0800) SpO2:  [93 %-100 %] 93 % (07/21 0800) Weight:  [188 lb 7.9 oz (85.5 kg)] 188 lb 7.9 oz (85.5 kg) (07/21 0500)  Hemodynamic parameters for last 24 hours:    Intake/Output from previous day: 07/20 0701 - 07/21 0700 In: 860 [P.O.:840; I.V.:20] Out: 3651 [Urine:3650; Stool:1] Intake/Output this shift: Total I/O In: 120 [P.O.:120] Out: 200 [Urine:200]  General appearance: alert and no distress Neurologic: intact Heart: irregularly irregular rhythm Lungs: clear to auscultation bilaterally  Lab Results:  Basename 04/18/12 0430 04/17/12 0336  WBC 6.2 7.4  HGB 8.5* 8.7*  HCT 25.5* 25.8*  PLT 167 151   BMET:  Basename 04/18/12 0430 04/17/12 0336  NA 142 138  K 3.2* 3.1*  CL 103 101  CO2 28 25  GLUCOSE 99 98  BUN 51* 47*  CREATININE 1.49* 1.86*  CALCIUM 8.2* 8.2*    PT/INR:  Basename 04/18/12 0430  LABPROT 19.5*  INR 1.62*   ABG    Component Value Date/Time   PHART 7.396 04/13/2012 0951   HCO3 21.2 04/13/2012 0951   TCO2 20 04/13/2012 1710   ACIDBASEDEF 3.0* 04/13/2012 0951   O2SAT 98.0 04/13/2012 0951   CBG (last 3)   Basename 04/15/12 1636 04/15/12 1207  GLUCAP 87 99    Assessment/Plan: S/P Procedure(s) (LRB): AORTIC VALVE REPLACEMENT (AVR) (N/A) THORACIC ASCENDING ANEURYSM REPAIR (AAA) (N/A) Plan for transfer to step-down: see transfer orders CV- remains in rate controlled a fib  RESP - continue IS  RENAL- creatinine continues to improve and is diuresing well  Hypokalemia- supplement K  ENDO CBG well controlled-  d/c CBG/ SSI   LOS: 12 days    HENDRICKSON,STEVEN C 04/18/2012

## 2012-04-19 LAB — CBC
Hemoglobin: 8.5 g/dL — ABNORMAL LOW (ref 12.0–15.0)
MCH: 30.5 pg (ref 26.0–34.0)
MCHC: 33.3 g/dL (ref 30.0–36.0)
MCV: 91.4 fL (ref 78.0–100.0)
Platelets: 211 10*3/uL (ref 150–400)
RBC: 2.79 MIL/uL — ABNORMAL LOW (ref 3.87–5.11)

## 2012-04-19 LAB — BASIC METABOLIC PANEL
CO2: 30 mEq/L (ref 19–32)
Calcium: 8.4 mg/dL (ref 8.4–10.5)
Creatinine, Ser: 1.36 mg/dL — ABNORMAL HIGH (ref 0.50–1.10)
GFR calc non Af Amer: 38 mL/min — ABNORMAL LOW (ref >90)
Glucose, Bld: 103 mg/dL — ABNORMAL HIGH (ref 70–99)

## 2012-04-19 LAB — PROTIME-INR: Prothrombin Time: 16.8 seconds — ABNORMAL HIGH (ref 11.6–15.2)

## 2012-04-19 MED ORDER — FUROSEMIDE 40 MG PO TABS
40.0000 mg | ORAL_TABLET | Freq: Two times a day (BID) | ORAL | Status: DC
  Administered 2012-04-19 – 2012-04-22 (×7): 40 mg via ORAL
  Filled 2012-04-19 (×9): qty 1

## 2012-04-19 MED ORDER — WARFARIN SODIUM 5 MG PO TABS
5.0000 mg | ORAL_TABLET | Freq: Every day | ORAL | Status: DC
  Administered 2012-04-19 – 2012-04-20 (×2): 5 mg via ORAL
  Filled 2012-04-19 (×3): qty 1

## 2012-04-19 NOTE — Progress Notes (Signed)
Physical Therapy Treatment Patient Details Name: [name] MRN: [id number] DOB: 1941/05/18 Today's Date: 04/19/2012 Time: 4098-1191 PT Time Calculation (min): 14 min  PT Assessment / Plan / Recommendation Comments on Treatment Session  Pt making excellent progress.      Follow Up Recommendations  Home health PT;Supervision - Intermittent    Barriers to Discharge        Equipment Recommendations  None recommended by PT    Recommendations for Other Services    Frequency Min 3X/week   Plan Frequency remains appropriate;Discharge plan needs to be updated    Precautions / Restrictions Precautions Precautions: Sternal;Fall Precaution Comments: Re-educated on sternal precautions. Restrictions Weight Bearing Restrictions: No   Pertinent Vitals/Pain No c/o pain    Mobility  Bed Mobility Bed Mobility: Not assessed Transfers Transfers: Sit to Stand;Stand to Sit Sit to Stand: 5: Supervision;From bed;With upper extremity assist Stand to Sit: 5: Supervision;To chair/3-in-1;With upper extremity assist Stand Pivot Transfers: Not tested (comment) Details for Transfer Assistance: Supervision for safety. Pt demonstrating safe techniques.  Ambulation/Gait Ambulation/Gait Assistance: 5: Supervision Ambulation Distance (Feet): 150 Feet Assistive device: Rolling walker Ambulation/Gait Assistance Details: Cues to increase gait speed and avoid pushing down on UEs secondary to sternal precautions.  Gait Pattern: Decreased stride length;Trunk flexed Gait velocity: decreased Stairs: No Wheelchair Mobility Wheelchair Mobility: No    Exercises General Exercises - Lower Extremity Long Arc Quad: AROM;Both;15 reps;Seated Hip Flexion/Marching: AROM;Both;15 reps;Seated   PT Diagnosis:    PT Problem List:   PT Treatment Interventions:     PT Goals Acute Rehab PT Goals PT Goal Formulation: With patient Time For Goal Achievement: 04/24/12 Pt will go Supine/Side to Sit: with  supervision Pt will go Sit to Stand: with modified independence PT Goal: Sit to Stand - Progress: Progressing toward goal Pt will go Stand to Sit: with modified independence PT Goal: Stand to Sit - Progress: Progressing toward goal Pt will Transfer Bed to Chair/Chair to Bed: with modified independence PT Transfer Goal: Bed to Chair/Chair to Bed - Progress: Progressing toward goal Pt will Ambulate: >150 feet;with modified independence PT Goal: Ambulate - Progress: Progressing toward goal  Visit Information  Last PT Received On: 04/19/12 Assistance Needed: +1    Subjective Data  Subjective: I plan to go home. My daughters will be with me as much as I need them to. Patient Stated Goal: return to home.   Cognition  Overall Cognitive Status: Appears within functional limits for tasks assessed/performed Arousal/Alertness: Awake/alert Orientation Level: Appears intact for tasks assessed Behavior During Session: [facility] for tasks performed    Balance  Balance Balance Assessed: No  End of Session PT - End of Session Equipment Utilized During Treatment: Gait belt Activity Tolerance: Patient tolerated treatment well Patient left: in chair;with call bell/phone within reach Nurse Communication: Mobility status   GP     Anikka Marsan 04/19/2012, 2:59 PM Camiya Vinal L. Wassim Kirksey DPT [phone]

## 2012-04-19 NOTE — Progress Notes (Signed)
Clinical Social Worker reviewed chart and noted pt required a [employer].  CSW phoned [facility] and submitted appropriate paperwork.  CSW phoned [facility] who stated they were waiting on paperwork, CSW submitted appropriate paperwork.  CSW to handoff to Unit CSW.  Unit CSW to continue to follow.   Angelia Mould (Covering for [phone]

## 2012-04-19 NOTE — Progress Notes (Signed)
7 Days Post-Op Procedure(s) (LRB): AORTIC VALVE REPLACEMENT (AVR) (N/A) THORACIC ASCENDING ANEURYSM REPAIR (AAA) (N/A) Subjective: Feels well this AM  Objective: Vital signs in last 24 hours: Temp:  [97.4 F (36.3 C)-97.5 F (36.4 C)] 97.5 F (36.4 C) (07/22 0421) Pulse Rate:  [79-94] 79  (07/22 0421) Cardiac Rhythm:  [-]  Resp:  [14-23] 20  (07/22 0421) BP: (115-116)/(49-80) 115/80 mmHg (07/21 1332) SpO2:  [92 %-98 %] 95 % (07/22 0421) Weight:  [186 lb 6.4 oz (84.55 kg)] 186 lb 6.4 oz (84.55 kg) (07/22 0421)  Hemodynamic parameters for last 24 hours:    Intake/Output from previous day: 07/21 0701 - 07/22 0700 In: 120 [P.O.:120] Out: 925 [Urine:925] Intake/Output this shift:    General appearance: alert and no distress Heart: irregularly irregular rhythm Lungs: clear to auscultation bilaterally Extremities: edema 1+ bilateral Wound: clean and dry  Lab Results:  [name] 04/19/12 0615 04/18/12 0430  WBC 6.2 6.2  HGB 8.5* 8.5*  HCT 25.5* 25.5*  PLT 211 167   BMET:  Basename 04/19/12 0615 04/18/12 0430  NA 142 142  K 3.9 3.2*  CL 104 103  CO2 30 28  GLUCOSE 103* 99  BUN 52* 51*  CREATININE 1.36* 1.49*  CALCIUM 8.4 8.2*    PT/INR:  Basename 04/19/12 0615  LABPROT 16.8*  INR 1.34   ABG    Component Value Date/Time   PHART 7.396 04/13/2012 0951   HCO3 21.2 04/13/2012 0951   TCO2 20 04/13/2012 1710   ACIDBASEDEF 3.0* 04/13/2012 0951   O2SAT 98.0 04/13/2012 0951   CBG (last 3)  No results found for this basename: GLUCAP:3 in the last 72 hours  Assessment/Plan: S/P Procedure(s) (LRB): AORTIC VALVE REPLACEMENT (AVR) (N/A) THORACIC ASCENDING ANEURYSM REPAIR (AAA) (N/A) - Continues to improve INR still subtherapeutic- increase coumadin to 5 mg Still volume overloaded- change to BID lasix Acute tubular necrosis resolving, creatinine continues to decrease   LOS: 13 days    [name] C 04/19/2012

## 2012-04-19 NOTE — Progress Notes (Signed)
CARDIAC REHAB PHASE I   PRE:  Rate/Rhythm: 80 afib    BP: sitting 100/64    SaO2: 97 RA  MODE:  Ambulation: 350 ft   POST:  Rate/Rhythm: 104    BP: sitting 120/80     SaO2: 95 RA  Tolerated well with min assist x2 using RW. Seemed SOB, SaO2 good. To bed with feet elevated after walk. Will fu as x1. No major c/o. [id number]  Harriet Masson CES, ACSM

## 2012-04-20 MED ORDER — SODIUM CHLORIDE 0.9 % IJ SOLN
10.0000 mL | INTRAMUSCULAR | Status: DC | PRN
  Administered 2012-04-20: 20 mL
  Administered 2012-04-21: 10 mL
  Administered 2012-04-22: 20 mL

## 2012-04-20 MED ORDER — POLYSACCHARIDE IRON COMPLEX 150 MG PO CAPS
150.0000 mg | ORAL_CAPSULE | Freq: Every day | ORAL | Status: DC
  Administered 2012-04-20 – 2012-04-22 (×3): 150 mg via ORAL
  Filled 2012-04-20 (×3): qty 1

## 2012-04-20 MED FILL — Warfarin Sodium Tab 2.5 MG: ORAL | Qty: 1 | Status: AC

## 2012-04-20 MED FILL — Potassium Chloride Microencapsulated Crys ER Tab 20 mEq: ORAL | Qty: 2 | Status: AC

## 2012-04-20 MED FILL — Nutritional Supplement Liquid: ORAL | Qty: 1 | Status: AC

## 2012-04-20 MED FILL — Amiodarone HCl Tab 200 MG: ORAL | Qty: 1 | Status: AC

## 2012-04-20 NOTE — Progress Notes (Signed)
Physical Therapy Treatment Patient Details Name: [name] MRN: [id number] DOB: 15-Jan-1941 Today's Date: 04/20/2012 Time: 8469-6295 PT Time Calculation (min): 27 min  PT Assessment / Plan / Recommendation Comments on Treatment Session  Pt adm for AVR and aortic aneurysm repair.  Pt continues to make excellent progress and should be able to return home with family.    Follow Up Recommendations  Home health PT;Supervision - Intermittent    Barriers to Discharge        Equipment Recommendations  None recommended by PT    Recommendations for Other Services    Frequency Min 3X/week   Plan Discharge plan remains appropriate;Frequency remains appropriate    Precautions / Restrictions Precautions Precautions: Sternal;Fall   Pertinent Vitals/Pain VSS    Mobility  Transfers Sit to Stand: From chair/3-in-1;Without upper extremity assist;6: Modified independent (Device/Increase time) Stand to Sit: 6: Modified independent (Device/Increase time);Without upper extremity assist;To chair/3-in-1 Details for Transfer Assistance: Used heart pillow for transfer. Ambulation/Gait Ambulation/Gait Assistance: 5: Supervision Ambulation Distance (Feet): 500 Feet Assistive device: Rolling walker Gait Pattern: Decreased stride length;Step-through pattern    Exercises     PT Diagnosis:    PT Problem List:   PT Treatment Interventions:     PT Goals Acute Rehab PT Goals PT Goal: Sit to Stand - Progress: Met PT Goal: Stand to Sit - Progress: Met PT Goal: Ambulate - Progress: Progressing toward goal  Visit Information  Last PT Received On: 04/20/12 Assistance Needed: +1    Subjective Data  Subjective: Pt states she was glad her nephew got to come see her.   Cognition  Overall Cognitive Status: Appears within functional limits for tasks assessed/performed Arousal/Alertness: Awake/alert Orientation Level: Appears intact for tasks assessed Behavior During Session: [facility] for tasks  performed    Balance  Static Standing Balance Static Standing - Balance Support: No upper extremity supported;During functional activity Static Standing - Level of Assistance: 6: Modified independent (Device/Increase time)  End of Session PT - End of Session Activity Tolerance: Patient tolerated treatment well Patient left: in chair;with call bell/phone within reach Nurse Communication: Mobility status   GP     [facility] 04/20/2012, 4:39 PM  [facility] PT [phone]

## 2012-04-20 NOTE — Progress Notes (Addendum)
8 Days Post-Op Procedure(s) (LRB): AORTIC VALVE REPLACEMENT (AVR) (N/A) THORACIC ASCENDING ANEURYSM REPAIR (AAA) (N/A)  Subjective: Patient without complaints this am.  Objective: Vital signs in last 24 hours: Patient Vitals for the past 24 hrs:  BP Temp Temp src Pulse Resp SpO2 Weight  04/20/12 0506 146/83 mmHg 97.4 F (36.3 C) Oral 80  20  96 % 183 lb 10.3 oz (83.3 kg)  04/19/12 2023 125/88 mmHg 97.2 F (36.2 C) Oral 85  21  92 % -  04/19/12 1338 108/77 mmHg 97.6 F (36.4 C) Oral 79  26  97 % -   Pre op weight 79.5  kg Current Weight  04/20/12 183 lb 10.3 oz (83.3 kg)      Intake/Output from previous day: 07/22 0701 - 07/23 0700 In: 240 [P.O.:240] Out: -    Physical Exam:  Cardiovascular: Crecencio Mc. Pulmonary: Clear to auscultation bilaterally; no rales, wheezes, or rhonchi. Abdomen: Soft, non tender, bowel sounds present. Extremities: Mild bilateral lower extremity edema. Wounds: Clean and dry.  No erythema or signs of infection.  Lab Results: CBC: Basename 04/19/12 0615 04/18/12 0430  WBC 6.2 6.2  HGB 8.5* 8.5*  HCT 25.5* 25.5*  PLT 211 167   BMET:  Basename 04/19/12 0615 04/18/12 0430  NA 142 142  K 3.9 3.2*  CL 104 103  CO2 30 28  GLUCOSE 103* 99  BUN 52* 51*  CREATININE 1.36* 1.49*  CALCIUM 8.4 8.2*    PT/INR:  Lab Results  Component Value Date   INR 1.40 04/20/2012   INR 1.34 04/19/2012   INR 1.62* 04/18/2012   ABG:  INR: Will add last result for INR, ABG once components are confirmed Will add last 4 CBG results once components are confirmed  Assessment/Plan:  1. CV - Post op Afib.Continue Amiodarone 200 bid, Coreg 3.125 bid, and Coumadin. 2.  Pulmonary - Encourage incentive spirometer. 3. Volume Overload - Continue with bid diuresis. 4.  Acute blood loss anemia - Last H and H stable at 8.5 and 25.5. Start Nu Iron. 5.ATN resolving-last creatinine down to 1.36. Will check in am. 6. Remove EPW in am.  ZIMMERMAN,DONIELLE  MPA-C 04/20/2012  Patient seen and examined. Agree with above. Will go ahead and d/c pacing wires today before INR bumps Continue 5 mg coumadin Probably home Thursday

## 2012-04-20 NOTE — Progress Notes (Signed)
CARDIAC REHAB PHASE I   PRE:  Rate/Rhythm: 80 afib    BP: sitting 94/50    SaO2:   MODE:  Ambulation: 550 ft   POST:  Rate/Rhythm: 104    BP: sitting 104/60     SaO2:   Steady with RW, increased distance. SOB and talking most of walk. Return to bed. Encouraged sitting up later and more walking. Will f/u. [id number]  Harriet Masson CES, ACSM

## 2012-04-20 NOTE — Progress Notes (Signed)
Patient doing well- post op- actually is hoping she may do well enough to go home instead of SNF however is not certain as of today. I am still coordinating with [facility] and [employer] in case this remains the same and will advise RNCM of possible d/c home for HH/DME arrangements. Patient very positive and relieved to have the surgery behind her- appreciative of care by all while here. Reece Levy, MSW, Theresia Majors [phone]

## 2012-04-21 LAB — BASIC METABOLIC PANEL
BUN: 45 mg/dL — ABNORMAL HIGH (ref 6–23)
CO2: 32 mEq/L (ref 19–32)
Glucose, Bld: 96 mg/dL (ref 70–99)
Potassium: 3.5 mEq/L (ref 3.5–5.1)
Sodium: 146 mEq/L — ABNORMAL HIGH (ref 135–145)

## 2012-04-21 MED ORDER — POLYETHYLENE GLYCOL 3350 17 G PO PACK
17.0000 g | PACK | Freq: Once | ORAL | Status: AC
  Administered 2012-04-21: 17 g via ORAL
  Filled 2012-04-21: qty 1

## 2012-04-21 MED ORDER — WARFARIN SODIUM 7.5 MG PO TABS
7.5000 mg | ORAL_TABLET | Freq: Once | ORAL | Status: AC
  Administered 2012-04-21: 7.5 mg via ORAL
  Filled 2012-04-21: qty 1

## 2012-04-21 NOTE — Progress Notes (Signed)
9 Days Post-Op Procedure(s) (LRB): AORTIC VALVE REPLACEMENT (AVR) (N/A) THORACIC ASCENDING ANEURYSM REPAIR (AAA) (N/A) Subjective: Uncomfortable last night due to constipation, had small BM this AM  Objective: Vital signs in last 24 hours: Temp:  [96.8 F (36 C)-97.2 F (36.2 C)] 96.8 F (36 C) (07/24 0508) Pulse Rate:  [75-82] 75  (07/24 0508) Cardiac Rhythm:  [-] Atrial fibrillation (07/24 0833) Resp:  [20] 20  (07/24 0508) BP: (99-115)/(60-82) 109/72 mmHg (07/24 0508) SpO2:  [95 %-98 %] 95 % (07/24 0508) Weight:  [183 lb 10.3 oz (83.3 kg)] 183 lb 10.3 oz (83.3 kg) (07/24 0100)  Hemodynamic parameters for last 24 hours:    Intake/Output from previous day: 07/23 0701 - 07/24 0700 In: 120 [P.O.:120] Out: -  Intake/Output this shift:    General appearance: alert and no distress Heart: regular rate and rhythm Lungs: diminished breath sounds bibasilar Wound: clean and dry  Lab Results:  [facility] 04/19/12 0615  WBC 6.2  HGB 8.5*  HCT 25.5*  PLT 211   BMET:  Basename 04/21/12 0523 04/19/12 0615  NA 146* 142  K 3.5 3.9  CL 103 104  CO2 32 30  GLUCOSE 96 103*  BUN 45* 52*  CREATININE 1.09 1.36*  CALCIUM 8.7 8.4    PT/INR:  Basename 04/21/12 0523  LABPROT 18.4*  INR 1.50*   ABG    Component Value Date/Time   PHART 7.396 04/13/2012 0951   HCO3 21.2 04/13/2012 0951   TCO2 20 04/13/2012 1710   ACIDBASEDEF 3.0* 04/13/2012 0951   O2SAT 98.0 04/13/2012 0951   CBG (last 3)  No results found for this basename: GLUCAP:3 in the last 72 hours  Assessment/Plan: S/P Procedure(s) (LRB): AORTIC VALVE REPLACEMENT (AVR) (N/A) THORACIC ASCENDING ANEURYSM REPAIR (AAA) (N/A) - Will give miralax this AM D/c EPW- not sure why order was cancelled yesterday INR slow to increase- will give 7.5 mg coumadin today Plan for d/c home tomorrow   LOS: 15 days    [name] C 04/21/2012

## 2012-04-21 NOTE — Progress Notes (Signed)
PT Cancellation Note  Treatment cancelled today due to Pt c/o fatigue. Pt ambulated with cardiac rehab about 10 minutes prior to PT arrival.  Will check on pt later today.  [name]  Samie Barclift 04/21/2012, 5:01 PM Benno Brensinger L. Fordyce Lepak DPT [phone]

## 2012-04-21 NOTE — Progress Notes (Signed)
Removed pt's EPW, ends intact and no bleeding noted. Pt tolerated well. Pt in chronic AFib MD aware. Pt set up to frequent vitals and on bedrest for one hour.

## 2012-04-21 NOTE — Progress Notes (Signed)
CARDIAC REHAB PHASE I   PRE:  Rate/Rhythm: 83afib  BP:  Supine:   Sitting: 110/70  Standing:    SaO2: would not register  MODE:  Ambulation: 550 ft   POST:  Rate/Rhythem: 86afib  BP:  Supine:   Sitting: 100/70  Standing:    SaO2: 97%RA 1320-1404 Pt walked 550 ft on RA with steady gait with rolling walker. Tired by end of walk. Did not feel she could increase distance. To recliner after walk. Call bell in reach.  Duanne Limerick

## 2012-04-22 LAB — PROTIME-INR: INR: 1.81 — ABNORMAL HIGH (ref 0.00–1.49)

## 2012-04-22 MED ORDER — ASPIRIN 81 MG PO TBEC: 81.0000 mg | DELAYED_RELEASE_TABLET | Freq: Every day | ORAL | Status: DC

## 2012-04-22 MED ORDER — OXYCODONE HCL 5 MG PO TABS: 5.0000 mg | ORAL_TABLET | ORAL | Status: AC | PRN

## 2012-04-22 MED ORDER — WARFARIN SODIUM 5 MG PO TABS: 5.0000 mg | ORAL_TABLET | Freq: Every day | ORAL | Status: DC

## 2012-04-22 MED ORDER — AMIODARONE HCL 200 MG PO TABS: 200.0000 mg | ORAL_TABLET | Freq: Every day | ORAL | Status: DC

## 2012-04-22 MED ORDER — FUROSEMIDE 40 MG PO TABS: 40.0000 mg | ORAL_TABLET | Freq: Every day | ORAL | Status: DC

## 2012-04-22 NOTE — Progress Notes (Signed)
Ed completed and pt requests her name be sent to [facility].  [id number] Ethelda Chick CES, ACSM

## 2012-04-22 NOTE — Progress Notes (Signed)
Removed pt's chest tube sutures and applied steri strips with benzoin.

## 2012-04-22 NOTE — Progress Notes (Signed)
Discharge instructions given to pt and pt's dtr. Pt ready for discharge. Called IV nurse to remove PICC line.

## 2012-04-22 NOTE — Progress Notes (Signed)
[address]            [place] [address]          [phone]     10 Days Post-Op  Procedure(s) (LRB): AORTIC VALVE REPLACEMENT (AVR) (N/A) THORACIC ASCENDING ANEURYSM REPAIR (AAA) (N/A) Subjective: conts to feel well  Objective  Telemetry afib with CVR  Temp:  [97 F (36.1 C)-97.8 F (36.6 C)] 97.8 F (36.6 C) (07/25 0452) Pulse Rate:  [71-84] 75  (07/25 0452) Resp:  [18] 18  (07/25 0452) BP: (103-123)/(57-86) 122/86 mmHg (07/25 0716) SpO2:  [94 %-100 %] 100 % (07/25 0452) Weight:  [183 lb 3.2 oz (83.1 kg)] 183 lb 3.2 oz (83.1 kg) (07/25 0452)   Intake/Output Summary (Last 24 hours) at 04/22/12 0738 Last data filed at 04/21/12 2149  Gross per 24 hour  Intake    420 ml  Output    250 ml  Net    170 ml       General appearance: alert, cooperative and no distress Heart: irregularly irregular rhythm Lungs: clear to auscultation bilaterally Abdomen: benign Extremities: no edema, redness or tenderness in the calves or thighs Wound: incisions healing well  Lab Results:  Basename 04/21/12 0523  NA 146*  K 3.5  CL 103  CO2 32  GLUCOSE 96  BUN 45*  CREATININE 1.09  CALCIUM 8.7  MG --  PHOS --   No results found for this basename: AST:2,ALT:2,ALKPHOS:2,BILITOT:2,PROT:2,ALBUMIN:2 in the last 72 hours No results found for this basename: LIPASE:2,AMYLASE:2 in the last 72 hours No results found for this basename: WBC:2,NEUTROABS:2,HGB:2,HCT:2,MCV:2,PLT:2 in the last 72 hours No results found for this basename: CKTOTAL:4,CKMB:4,TROPONINI:4 in the last 72 hours No components found with this basename: POCBNP:3 No results found for this basename: DDIMER in the last 72 hours No results found for this basename: HGBA1C in the last 72 hours No results found for this basename: CHOL,HDL,LDLCALC,TRIG,CHOLHDL in the last 72 hours No results found for this basename: TSH,T4TOTAL,FREET3,T3FREE,THYROIDAB in the last 72 hours No results found  for this basename: VITAMINB12,FOLATE,FERRITIN,TIBC,IRON,RETICCTPCT in the last 72 hours  Medications: Scheduled    . allopurinol  100 mg Oral Daily  . amiodarone  200 mg Oral Q12H   Followed by  . amiodarone  200 mg Oral Daily  . aspirin EC  81 mg Oral Daily  . bisacodyl  10 mg Oral Daily   Or  . bisacodyl  10 mg Rectal Daily  . carvedilol  3.125 mg Oral BID WC  . docusate sodium  200 mg Oral Daily  . feeding supplement  30 mL Oral TID WC  . feeding supplement  1 Container Oral TID BM  . furosemide  40 mg Oral BID  . iron polysaccharides  150 mg Oral Daily  . moving right along book   Does not apply Once  . pantoprazole  40 mg Oral Q1200  . polyethylene glycol  17 g Oral Once  . sodium chloride  3 mL Intravenous Q12H  . warfarin  7.5 mg Oral ONCE-1800  . Warfarin - Physician Dosing Inpatient   Does not apply q1800  . DISCONTD: warfarin  5 mg Oral q1800     Radiology/Studies:  No results found.  INR: Will add last result for INR, ABG once components are confirmed Will add last 4 CBG results once components are confirmed  Assessment/Plan: S/P Procedure(s) (LRB): AORTIC VALVE REPLACEMENT (  AVR) (N/A) THORACIC ASCENDING ANEURYSM REPAIR (AAA) (N/A) Plan for discharge: see discharge orders   LOS: 16 days    GOLD,WAYNE E 7/25/20137:38 AM

## 2012-04-25 NOTE — Care Management (Signed)
[facility] aware of pt's d/c home. Isidoro Donning RN CCM Case Mgmt

## 2012-04-26 ENCOUNTER — Other Ambulatory Visit: Payer: Self-pay | Admitting: *Deleted

## 2012-04-26 ENCOUNTER — Telehealth: Payer: Self-pay | Admitting: Cardiology

## 2012-04-26 DIAGNOSIS — I10 Essential (primary) hypertension: Secondary | ICD-10-CM

## 2012-04-26 NOTE — Telephone Encounter (Signed)
HH nurse states pt's bp is 150/94 today and pt feels tired.  Per Norma Fredrickson, increase coreg to 6.25mg  BID and draw BMET while in office tomorrow for coumadin clinic.

## 2012-04-26 NOTE — Telephone Encounter (Signed)
[facility] nurse will check pt's b/p and call us back.

## 2012-04-26 NOTE — Telephone Encounter (Signed)
New PRrblem:    Called in because the patients BP readings were concerning and now she is going out to do an assessment and would like to know what we would like for her to evaluate when she arrives.  Please call back.

## 2012-04-27 ENCOUNTER — Ambulatory Visit (INDEPENDENT_AMBULATORY_CARE_PROVIDER_SITE_OTHER): Payer: Medicare Other | Admitting: Pharmacist

## 2012-04-27 ENCOUNTER — Other Ambulatory Visit (INDEPENDENT_AMBULATORY_CARE_PROVIDER_SITE_OTHER): Payer: Medicare Other

## 2012-04-27 ENCOUNTER — Telehealth: Payer: Self-pay | Admitting: Cardiology

## 2012-04-27 DIAGNOSIS — I4891 Unspecified atrial fibrillation: Secondary | ICD-10-CM

## 2012-04-27 DIAGNOSIS — I352 Nonrheumatic aortic (valve) stenosis with insufficiency: Secondary | ICD-10-CM

## 2012-04-27 DIAGNOSIS — I359 Nonrheumatic aortic valve disorder, unspecified: Secondary | ICD-10-CM

## 2012-04-27 DIAGNOSIS — Z954 Presence of other heart-valve replacement: Secondary | ICD-10-CM

## 2012-04-27 DIAGNOSIS — I10 Essential (primary) hypertension: Secondary | ICD-10-CM

## 2012-04-27 DIAGNOSIS — Z952 Presence of prosthetic heart valve: Secondary | ICD-10-CM | POA: Insufficient documentation

## 2012-04-27 LAB — BASIC METABOLIC PANEL
BUN: 18 mg/dL (ref 6–23)
CO2: 30 mEq/L (ref 19–32)
Calcium: 9 mg/dL (ref 8.4–10.5)
Chloride: 99 mEq/L (ref 96–112)
Creatinine, Ser: 1.1 mg/dL (ref 0.4–1.2)
GFR: 50.05 mL/min — ABNORMAL LOW (ref >60.00)
Glucose, Bld: 84 mg/dL (ref 70–99)
Potassium: 4.5 mEq/L (ref 3.5–5.1)
Sodium: 139 mEq/L (ref 135–145)

## 2012-04-27 LAB — POCT INR: INR: 4.2

## 2012-04-27 NOTE — Telephone Encounter (Signed)
New msg CHF nurse called about pt. She has lost 7 pounds since Friday. She said she is feeling dizzy. She said she has appt with coumadin clinic this morning. She said maybe can check on her while she is here

## 2012-04-27 NOTE — Telephone Encounter (Signed)
Spoke with daughter and she stated patient seems to be doing pretty good.  Advised of labs. Home health nurse coming out to check patient tomorrow.  Will continue same meds and monitoring blood pressure.  Will call back if any problems

## 2012-04-27 NOTE — Telephone Encounter (Signed)
Message copied by Burnell Blanks on Tue Apr 27, 2012  5:40 PM ------      Message from: Rosalio Macadamia      Created: Tue Apr 27, 2012 12:57 PM       Ok to report. Labs are satisfactory. Renal function back to normal. We increased her Coreg over the phone yesterday. Needs BP log over the next week. Could restart ARB if needed.

## 2012-04-28 ENCOUNTER — Telehealth: Payer: Self-pay | Admitting: Cardiology

## 2012-04-28 ENCOUNTER — Other Ambulatory Visit: Payer: Self-pay | Admitting: Cardiology

## 2012-04-28 MED ORDER — CARVEDILOL 3.125 MG PO TABS: 3.1250 mg | ORAL_TABLET | Freq: Two times a day (BID) | ORAL | Status: DC

## 2012-04-28 MED ORDER — CARVEDILOL 6.25 MG PO TABS: 6.2500 mg | ORAL_TABLET | Freq: Two times a day (BID) | ORAL | Status: DC

## 2012-04-28 NOTE — Telephone Encounter (Signed)
Fax Received. Refill Completed. [name] (R.M.A)   

## 2012-04-28 NOTE — Telephone Encounter (Signed)
Patients Carvedilol was increase by Dawayne Patricia. NP to 6.25 mg bid, new Rx phoned in with current dose to [facility]

## 2012-04-28 NOTE — Telephone Encounter (Signed)
Fu call °Pt's daughter calling back again °

## 2012-04-28 NOTE — Telephone Encounter (Signed)
New problem:  Need more pills due to dosage mg.

## 2012-05-03 ENCOUNTER — Ambulatory Visit: Payer: Self-pay | Admitting: Cardiology

## 2012-05-03 DIAGNOSIS — I352 Nonrheumatic aortic (valve) stenosis with insufficiency: Secondary | ICD-10-CM

## 2012-05-03 DIAGNOSIS — I4891 Unspecified atrial fibrillation: Secondary | ICD-10-CM

## 2012-05-03 DIAGNOSIS — Z954 Presence of other heart-valve replacement: Secondary | ICD-10-CM

## 2012-05-03 LAB — POCT INR: INR: 6

## 2012-05-04 ENCOUNTER — Ambulatory Visit: Payer: Self-pay | Admitting: Cardiovascular Disease

## 2012-05-04 DIAGNOSIS — I4891 Unspecified atrial fibrillation: Secondary | ICD-10-CM

## 2012-05-04 DIAGNOSIS — I352 Nonrheumatic aortic (valve) stenosis with insufficiency: Secondary | ICD-10-CM

## 2012-05-04 DIAGNOSIS — Z954 Presence of other heart-valve replacement: Secondary | ICD-10-CM

## 2012-05-04 NOTE — Discharge Summary (Signed)
[address]            [place] [address]          [phone]      CHYANNA FLOCK 07-14-1941 71 y.o. [id number]  04/06/2012 04/22/2012  No att. providers found  aortic stenosis; ascending aortic aneurysm ai  History of Present Illness:  Patient is 71 year old female with long history of aortic insufficiency.  She required urgent vascular surgery by Dr. [name] several weeks ago because of acute arterial embolus embolus to her left lower leg. She had not previously been on anticoagulation despite her known atrial fibrillation because of a remote history of an intracerebral hemorrhage. 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 Patient has had a long history of known aortic insufficiency with compensated congestive heart failure. Her echocardiogram done showing a recent hospitalization showed moderate to severe aortic insufficiency, increasing dilatation of her aortic root, and poor LV function with an ejection fraction of 20%. She recent echo done in May 2013 at [facility] therefore showed a significant decline in her left ventricular function. With this in mind, the patient is inquiring about possible surgical correction of her acending ( 4 cm by echo, not ct) aortic dilatation and her significant aortic valve regurgitation. Also of note in her history is the fact that she has a defibrillator in place because of a prior history of cardiac arrest secondary to ventricular tachycardia.  The patient's overall physical ability to get around is very limited her daughter does all the grocery shopping she is unable to walk even a quarter of mile. She does have nocturnal dyspnea one to 2 pillow orthopnea. She's had chronic edema in both lower [place] 3 years. She's at least class 3-4 heart failure.  The patient returns today with her daughter to further discuss her options. Since seen  2 weeks ago she appears to be slightly more short of breath than she was even 2 weeks ago. She was referred to Sheliah Plane M.D. for surgical consultation. The patient was admitted for cardiac catheterization and a CT scan of the chest for assisting with decision making for planned surgery.   Past Medical History   Diagnosis  Date   .  History of ventricular tachycardia    .  Peripheral edema    .  Aortic insufficiency      Moderate 2012   .  History of gout    .  Exogenous obesity    .  History of CHF (congestive heart failure)      EF 45% 2012, nonischemic (Normal coronaries 2002)   .  Automatic implantable cardiac defibrillator single-chamber-Medtronic  11/04/2011   .  Hypertension    .  A-fib      Not on coumadin due to intracebral hemorrhage   .  ICD (implantable cardiac defibrillator) in place  2012   .  DOE (dyspnea on exertion)    .  Stroke  2000   .  Renal insufficiency    .  Myocardial infarction  2000   .  Anxiety    .  Heart murmur    .  Anemia    .  Presence of IVC filter  2000    Past Surgical History   Procedure  Date   .  Cardiac defibrillator placement    .  Cataract extraction    .  Embolectomy  02/15/2012     Procedure: EMBOLECTOMY; Surgeon: [name], MD; Location: [facility] OR; Service: Vascular; Laterality: Left; left popliteal embolectomy with drain placement   .  Insert / replace / remove pacemaker    .  Eye surgery  2012     removal of cataracts bilaterally   .  Cardiac catheterization    .  Tonsillectomy    patient has a vena caval filter in place put in in 2002, she has a card documenting this but is not sure why it was placed  Family History   Problem  Relation  Age of Onset   .  Hypertension  Father    .  Cancer  Father    .  Coronary artery disease  Father  51    History    Social History   .  Marital Status:  Divorced     Spouse Name:  N/A     Number of Children:  N/A   .  Years of Education:  N/A    Occupational History   .   Not on file.    Social History Main Topics   .  Smoking status:  Never Smoker   .  Smokeless tobacco:  Never Used   .  Alcohol Use:  No   .  Drug Use:  No   .  Sexually Active:  No    Other Topics  Concern   .  Not on file    Social History Narrative    Lives with daughter.    History   Smoking status   .  Never Smoker   Smokeless tobacco   .  Never Used    History   Alcohol Use  No    Allergies   Allergen  Reactions   .  Latex  Rash   .  Neomycin-Bacitracin Zn-Polymyx  Rash     Diagnostic Studies & Laboratory data:  Transthoracic Echocardiography  Patient: Vicenta, Olds MR #: [id number] Study Date: 02/15/2012 Gender: F Age: 54 Height: 157.5cm Weight: 82.3kg BSA: 1.71m^2 Pt. Status: Room: 4704  PERFORMING [place], [facility] SONOGRAPHER Silvano Bilis, RCS ADMITTING Newell, Vijaya ATTENDING Kathlen Mody Ola Spurr, [place] cc:  ------------------------------------------------------------ LV EF: 20%  ------------------------------------------------------------ Indications: Aortic insufficiency 424.1. Atrial fibrillation - 427.31.  ------------------------------------------------------------ History: PMH: Evaluate for embolus. Dyspnea. Congestive heart failure. Risk factors: Hypertension. Dyslipidemia.  ------------------------------------------------------------ Study Conclusions  - Left ventricle: The cavity size was moderately dilated. Wall thickness was normal. Systolic function was severely reduced. The estimated ejection fraction was 20%. Severe diffuse hypokinesis with absent systolic thickening of the inferior myocardium. - Aortic valve: Moderate to severe regurgitation. - Ascending aorta: The ascending aorta was mildly to moderately dilated. Linear echo in the ascending aorta likely artifact, but dissection cannot be unequivocally excluded. - Mitral valve: Mild regurgitation. - Left atrium: The atrium was moderately to  severely dilated. - Right atrium: The atrium was moderately dilated. - Atrial septum: No defect or patent foramen ovale was identified. - Pulmonary arteries: PA peak pressure: 52mm Hg (S). Impressions:  - Compared to the previous study performed 12/23/10, LV and aortic root size have increased; further impairment of LV systolic function, worsening AI. Transthoracic echocardiography. M-mode, complete 2D, spectral Doppler, and color Doppler. Height: Height: 157.5cm. Height: 62in. Weight: Weight: 82.3kg. Weight: 181.1lb. Body mass index: BMI: 33.2kg/m^2. Body surface area: BSA: 1.86m^2. Blood pressure: 154/49. Patient status: Inpatient. Location: Bedside.  ------------------------------------------------------------  ------------------------------------------------------------ Left ventricle:  The cavity size was moderately dilated. Wall thickness was normal. Systolic function was severely reduced. The estimated ejection fraction was 20%. Severe diffuse hypokinesis with absent systolic thickening of the inferior myocardium.  ------------------------------------------------------------ Aortic valve: Trileaflet; mildly thickened leaflets. Doppler: Transvalvular velocity was increased. There was no stenosis. Moderate to severe regurgitation. Peak velocity ratio of LVOT to aortic valve: 0.78. Mean gradient: 11mm Hg (S). Peak gradient: 22mm Hg (S).  ------------------------------------------------------------ Aorta: Aortic root: The aortic root was mildly dilated. Ascending aorta: The ascending aorta was mildly to moderately dilated. Linear echo in the ascending aorta likely artifact, but dissection cannot be unequivocally excluded. Descending aorta: The descending aorta was normal in size. No atheroma apparent.  ------------------------------------------------------------ Mitral valve: Mildly thickened leaflets . Leaflet separation was normal. Doppler: Transvalvular velocity  was within the normal range. There was no evidence for stenosis. Mild regurgitation. Mean gradient: 5mm Hg (D). Peak gradient: 12mm Hg (D).  ------------------------------------------------------------ Left atrium: The atrium was moderately to severely dilated.  ------------------------------------------------------------ Atrial septum: No defect or patent foramen ovale was identified.  ------------------------------------------------------------ Right ventricle: The cavity size was normal. Wall thickness was normal. Pacer wire or catheter noted in right ventricle. Systolic function was normal.  ------------------------------------------------------------ Pulmonic valve: Structurally normal valve. Cusp separation was normal. Doppler: Transvalvular velocity was within the normal range. No regurgitation.  ------------------------------------------------------------ Tricuspid valve: Structurally normal valve. Leaflet separation was normal. Doppler: Transvalvular velocity was within the normal range. Trivial regurgitation.  ------------------------------------------------------------ Pulmonary artery: The main pulmonary artery was normal-sized. Systolic pressure could not be accurately estimated.  ------------------------------------------------------------ Right atrium: The atrium was moderately dilated.  ------------------------------------------------------------ Pericardium: There was no pericardial effusion.  ------------------------------------------------------------ Systemic veins: Inferior vena cava: The vessel was mildly dilated; the respirophasic diameter changes were in the normal range (= 50%).  ------------------------------------------------------------  2D measurements Normal Doppler measurements Normal Left ventricle Main pulmonary LVID ED, 68.9 mm 43-52 artery chord, Pressure, S 52 mm =30 PLAX Hg LVID ES, 57.9 mm 23-38 LVOT chord, Peak vel, S 184 cm/s  ------ PLAX Peak 14 mm ------ FS, chord, 16 % >29 gradient, S Hg PLAX Aortic valve LVPW, ED 10 mm ------ Peak vel, S 236 cm/s ------ IVS/LVPW 0.91 <1.3 Mean vel, S 153 cm/s ------ ratio, ED VTI, S 41.7 cm ------ Ventricular septum Mean 11 mm ------ IVS, ED 9.05 mm ------ gradient, S Hg Aortic valve Peak 22 mm ------ Leaflet 16 mm 15-26 gradient, S Hg separation Peak vel 0.78 ------ Aorta ratio, Root diam, 40 mm ------ LVOT/AV ED Regurg vel, 308 cm/s ------ Left atrium ED AP dim 36 mm ------ Regurg PHT 290 ms ------ AP dim 1.86 cm/m^2 <2.2 Regurg 38 mm ------ index gradient, ED Hg Mitral valve M-mode measurements Normal Peak E vel 153 cm/s ------ Aortic valve Mean vel, D 99.7 cm/s ------ Leaflet 16 mm 15-26 Mean 5 mm ------ separation gradient, D Hg Peak 12 mm ------ gradient, D Hg Annulus VTI 29 cm ------ Tricuspid valve Regurg peak 303 cm/s ------ vel Peak RV-RA 37 mm ------ gradient, S Hg Systemic veins Estimated 15 mm ------ CVP Hg Right ventricle Pressure, S 52 mm <30 Hg Sa vel, lat 10.4 cm/s ------ ann, tiss DP  ------------------------------------------------------------ Prepared and Electronically Authenticated by  [place] Bing, MD 2013-05-19T13:51:[id number]  Cath Report:  RCardiac Catheterization Procedure Note  Name: IZELA ALTIER  MRN: [id number]  DOB: 12-26-1940  Procedure: Right Heart Cath, Left Heart Cath, Selective Coronary Angiography, aortic root aortogram  Indication: Severe aortic insufficiency with heart failure, pre-cardiac surgery  evaluation.  Procedural Details: The right groin was prepped, draped, and anesthetized with 1% lidocaine. Using the modified Seldinger technique a 5 French sheath was placed in the right femoral artery and a 6 French sheath was placed in the right femoral vein. A 6 French multipurpose catheter was used for the right heart catheterization. The patient has an IVC filter and the catheter was guided under fluoroscopy so  that there was no trauma to the filter. Standard protocol was followed for recording of right heart pressures and sampling of oxygen saturations. Fick cardiac output was calculated. Standard Judkins catheters were used for selective coronary angiography and aortic root angiography. For the left coronary artery, a JL 5 catheter was used. There were no immediate procedural complications. The patient was transferred to the post catheterization recovery area for further monitoring.  Procedural Findings:  Hemodynamics  RA 17  RV 68/14  PA 69/31 with a mean of 48  PCWP 28  LV 165/29  AO 171/69 with a mean of 108  Oxygen saturations:  PA 60%  AO 98%  Cardiac Output (Fick) 4.3 L per  Cardiac Index (Fick) 2.4 L per minute per meters  Coronary angiography:  Coronary dominance: right  Left mainstem: The left mainstem is patent. There is no obstructive disease.  Left anterior descending (LAD): The LAD is patent to the left ventricular apex. The vessel wraps around the apex. There are minor irregularities but no significant stenoses throughout. The first diagonal is a large caliber vessel without significant disease  Left circumflex (LCx): The left circumflex is patent. There is a large first OM with no significant disease. The proximal circumflex has 30% stenosis.  Right coronary artery (RCA): The RCA is dominant. This is a large vessels. It is smooth throughout its course. There is no obstructive disease present. He gives off a PDA and 2 posterolateral branches without significant disease.  Aortic root angiography: There is severe aortic insufficiency. The left ventricle is opacified and appears to have severe diffuse hypokinesis.  Final Conclusions:  1. Mild nonobstructive diffuse coronary artery disease.  2. Severe aortic insufficiency with known severe LV dysfunction  3. Elevated left heart and right heart pressures with severe pulmonary hypertension and elevated left ventricular filling pressure.   the patient is pending cardiac surgery early next week. considering her atrial fibrillation and history of arterial embolus within the past 6 months. She will be started on intravenous unfractionated heparin  Tonny Bollman  Recent Radiology Findings:  Ct Angio Chest W/cm &/or Wo Cm  04/09/2012 *RADIOLOGY REPORT* Clinical Data: Preop. Severe aortic insufficiency. CT ANGIOGRAPHY CHEST Technique: Multidetector CT imaging of the chest using the standard protocol during bolus administration of intravenous contrast. Multiplanar reconstructed images including MIPs were obtained and reviewed to evaluate the vascular anatomy. Contrast: OMNIPAQUE IOHEXOL 350 MG/ML SOLN Comparison: None. Findings: The present examination was performed as a pulmonary embolus protocol and not during aortic phase imaging limiting evaluation. Taking this limitation into account, the ascending thoracic aorta 4.6 x 5 cm. Calcified aortic arch, descending thoracic aorta, upper abdominal aorta and origin of the great vessels. Cardiomegaly. Left ventricular hypertrophy may be present. AICD is in place with the tip in the region of the right atrium. Coronary artery calcifications. No pulmonary embolus. There may be increased right heart pressure. Small pleural effusions. Septal thickening. This may reflect pulmonary vascular congestion/mild congestive heart failure. Subcarinal adenopathy measuring up to 2.9 x 2.2 cm transverse dimension. Etiology indeterminate. Although this may be reactive origin, this  will require follow-up. Additionally, left base lobulated 1.4 x 1.3 x 0.7 cm nodule. Right renal 4 cm cyst. Degenerative changes thoracic spine. IMPRESSION: Ascending thoracic aorta measures 4.6 x 5 cm maximal transverse dimension. Atherosclerotic type changes remainder of visualized arterial structures as noted above. Cardiomegaly. AICD is in place. No pulmonary embolus. Subcarinal adenopathy measuring up to 2.9 x 2.2 cm transverse  dimension. Additionally, left base lobulated 1.4 x 1.3 x 0.7 cm nodule. This will require follow-up. This can be addressed on the patient's postoperative exam or CT chest in 3 months. Examination with reviewed with Dr. [name] 04/09/2038 7:45 p.m. Repeat imaging will not be performed preoperatively after this reviewer as dissection is not of concern and adequate measurements of the ascending thoracic aorta were obtained on the present exam. Original Report Authenticated By: Fuller Canada, M.D.    Hospital Course: The patient underwent her studies and surgery was scheduled. His alternatives above. She remained medically stable and on 04/12/2012 she was taken to the operating room where she underwent the following procedure:  OPERATIVE REPORT  PREOPERATIVE DIAGNOSES: Severe aortic insufficiency with severely  depressed left ventricular function and dilated ascending aorta and  history of atrial fibrillation.  POSTOPERATIVE DIAGNOSES: Severe aortic insufficiency with severely  depressed left ventricular function and dilated ascending aorta and  history of atrial fibrillation.  SURGICAL PROCEDURE: Aortic valve replacement with a 21-mm pericardial  tissue valve, [employer] model 3300TFX, serial number [id number].  Replacement of ascending aorta, supracoronary with a 32-mm Hemashield  graft and placement of 30-mm atrial clip. Fluoroscopy for repositioning  of Swan-Ganz catheter.  SURGEON: Sheliah Plane, MD  FIRST ASSISTANT: Rowe Clack, PA-C The patient was then transferred to the surgical intensive care unit in stable condition.  Postoperative hospital course: She overall progressed nicely. She did initially require some inotropic support and in addition nitric oxide was used. These were weaned without significant difficulty as was the ventilator. She remained neurologically intact. She did have an expected acute blood loss anemia. Her values stabilized. He did have some acute  exacerbation of some chronic renal insufficiency but additionally her BUN and creatinine also improved with time. Renal dosed dopamine was used for several days initially. But he was weaned off without difficulty. She maintained good urine output throughout. Postoperative she had atrial fibrillation but rate was controlled with amiodarone and Lopressor. She was started on Coumadin. All routine lines, monitors and drainage devices were discontinued in the standard fashion. Blood sugars were well controlled throughout hospitalization. She is tolerating gradually increasing activities using standard cardiac rehabilitation protocols. She additionally was assisted by physical therapy. Initially there were some plans for a skilled nursing facility however she did decide to return home with home health arrangements. She was felt to be stable for discharge on 04/22/2012.   No results found for this basename: NA:2,K:2,CL:2,CO2:2,GLUCOSE:2,BUN:2,CRATININE:2,CALCIUM:2 in the last 72 hours No results found for this basename: WBC:2,HGB:2,HCT:2,PLT:2 in the last 72 hours    Discharge Instructions:  The patient is discharged to home with extensive instructions on wound care and progressive ambulation.  They are instructed not to drive or perform any heavy lifting until returning to see the physician in his office. Discharge Diagnosis:  aortic stenosis; ascending aortic aneurysm ai  Secondary Diagnosis: Patient Active Problem List  Diagnosis  . HYPERLIPIDEMIA  . GOUT  . HYPERKALEMIA  . HYPERTENSION  . Atrial fibrillation  . CONGESTIVE HEART FAILURE  . VENOUS INSUFFICIENCY  . BACK PAIN, LUMBAR  . MEMORY LOSS  .  EDEMA  . FOOT PAIN, BILATERAL  . COMBINED HEART FAILURE, ACUTE ON CHRONIC  . SHORTNESS OF BREATH  . ALKALINE PHOSPHATASE, ELEVATED  . Aortic insufficiency and aortic stenosis  . Acute on chronic renal insufficiency  . Cardiac arrest  . Automatic implantable cardiac defibrillator  single-chamber-Medtronic  . Alopecia  . Lower limb ischemia  . Acute on chronic systolic congestive heart failure, NYHA class 3  . Acute on chronic kidney disease, stage 3  . Constipation  . Anemia  . Atherosclerosis of native arteries of the extremities with intermittent claudication  . Hypokalemia  . Heart valve replaced by other means   Past Medical History  Diagnosis Date  . History of ventricular tachycardia   . Peripheral edema   . Aortic insufficiency     Moderate 2012  . History of gout   . Exogenous obesity   . History of CHF (congestive heart failure)     EF 45% 2012, nonischemic (Normal coronaries 2002)  . 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 Hypertension   . 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 ICD (implantable cardiac defibrillator) in place 2012  . DOE (dyspnea on exertion)   . Stroke 2000  . Renal insufficiency   . Myocardial infarction 2000  . Anxiety   . Heart murmur   . Anemia   . Presence of IVC filter 2000   Medication List at time of discharge      Next dose due Provider      amiodarone 200 MG tablet    Commonly known as: PACERONE    Take 1 tablet (200 mg total) by mouth daily.           Wayne E Gold        aspirin 81 MG EC tablet    Take 1 tablet (81 mg total) by mouth daily.           Wayne E Gold        warfarin 5 MG tablet    Commonly known as: COUMADIN    Take 1 tablet (5 mg total) by mouth daily.           Wayne E Gold                                CHANGE how you take these medications         Next dose due Provider      furosemide 40 MG tablet    Commonly known as: LASIX    Take 1 tablet (40 mg total) by mouth daily.    What changed: - dose - doctor's instructions           Wayne E Gold                                CONTINUE taking these medications         Next dose due Provider      allopurinol 100 MG tablet    Commonly known as:  ZYLOPRIM    Take 100 mg by mouth daily.           Thomas Brackbill        ALPRAZolam 0.5 MG tablet    Commonly known as: XANAX    Take 0.5 mg by mouth 3 (three) times daily as needed. For  anxiety.               Biotin 5000 MCG Caps    Take 1 capsule by mouth daily.               ferrous sulfate 325 (65 FE) MG tablet    Take 325 mg by mouth daily with breakfast.           [name]        potassium chloride SA 20 MEQ tablet    Commonly known as: K-DUR,KLOR-CON    Take 20 mEq by mouth daily.                                       STOP taking these medications       losartan 25 MG tablet             nitroGLYCERIN 0.4 MG SL tablet             XARELTO 20 MG Tabs         Disposition: Discharge home  Patient's condition is Good  Gershon Crane, PA-C 05/04/2012  12:56 PM

## 2012-05-05 ENCOUNTER — Ambulatory Visit: Payer: Medicare Other | Admitting: Cardiology

## 2012-05-07 ENCOUNTER — Ambulatory Visit (INDEPENDENT_AMBULATORY_CARE_PROVIDER_SITE_OTHER): Payer: Medicare Other | Admitting: Cardiology

## 2012-05-07 ENCOUNTER — Encounter: Payer: Self-pay | Admitting: Cardiology

## 2012-05-07 VITALS — BP 118/80 | HR 85 | Ht 62.0 in | Wt 167.0 lb

## 2012-05-07 DIAGNOSIS — I359 Nonrheumatic aortic valve disorder, unspecified: Secondary | ICD-10-CM

## 2012-05-07 DIAGNOSIS — I4891 Unspecified atrial fibrillation: Secondary | ICD-10-CM

## 2012-05-07 DIAGNOSIS — I10 Essential (primary) hypertension: Secondary | ICD-10-CM

## 2012-05-07 DIAGNOSIS — Z952 Presence of prosthetic heart valve: Secondary | ICD-10-CM

## 2012-05-07 DIAGNOSIS — I351 Nonrheumatic aortic (valve) insufficiency: Secondary | ICD-10-CM

## 2012-05-07 DIAGNOSIS — Z954 Presence of other heart-valve replacement: Secondary | ICD-10-CM

## 2012-05-07 DIAGNOSIS — Z95810 Presence of automatic (implantable) cardiac defibrillator: Secondary | ICD-10-CM

## 2012-05-07 NOTE — Assessment & Plan Note (Signed)
The patient has had a previous history of cardiac arrest and has had a ICD in place for many years.  She has had no shocks from the ICD

## 2012-05-07 NOTE — Assessment & Plan Note (Signed)
She is being followed closely in the Coumadin clinic.  She has had no TIA symptoms.

## 2012-05-07 NOTE — Progress Notes (Signed)
[name] Date of Birth:  1941/02/05 [facility] [address] [place], [place]  [address] [phone]         Fax   [phone]  History of Present Illness: This pleasant 71 year old woman is seen for a post hospital office visit.  She recently underwent replacement of her aortic valve and ascending aortic root by Dr. [name].  She has a bioprosthetic valve.  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  She also has a dilated left ventricle with marked decrease in left ventricular systolic function.  She is now on carvedilol 6.2 mg by mouth twice a day.  Her symptoms of congestive heart failure have improved significantly since surgery.  She is no longer having any ankle edema or significant orthopnea.  Her weight is down 13 pounds since preop.  She remains in chronic atrial fibrillation and is now on Coumadin.  Current Outpatient Prescriptions  Medication Sig Dispense Refill  . allopurinol (ZYLOPRIM) 100 MG tablet Take 100 mg by mouth daily.       [name] ALPRAZolam (XANAX) 0.5 MG tablet Take 0.5 mg by mouth 3 (three) times daily as needed. For anxiety.      [name] amiodarone (PACERONE) 200 MG tablet Take 1 tablet (200 mg total) by mouth daily.  30 tablet  1  . aspirin EC 81 MG EC tablet Take 1 tablet (81 mg total) by mouth daily.      . Biotin 5000 MCG CAPS Take 1 capsule by mouth daily.      . carvedilol (COREG) 6.25 MG tablet Take 1 tablet (6.25 mg total) by mouth 2 (two) times daily with a meal.  60 tablet  5  . ferrous sulfate 325 (65 FE) MG tablet Take 325 mg by mouth daily with breakfast.       . furosemide (LASIX) 40 MG tablet Take 1 tablet (40 mg total) by mouth daily.  30 tablet  1  . potassium chloride SA (K-DUR,KLOR-CON) 20 MEQ tablet Take 20 mEq by mouth daily.      [name] warfarin (COUMADIN) 5 MG tablet Take 1 tablet (5 mg total) by mouth daily.  100 tablet  1  . NITROSTAT 0.4 MG SL tablet as needed.      [name] oxyCODONE (OXY  IR/ROXICODONE) 5 MG immediate release tablet as needed.      [name] DISCONTD: allopurinol (ZYLOPRIM) 100 MG tablet Take 1 tablet (100 mg total) by mouth daily.  90 tablet  3    Allergies  Allergen Reactions  . Latex Rash  . Neomycin-Bacitracin Zn-Polymyx Rash    Patient Active Problem List  Diagnosis  . HYPERLIPIDEMIA  . GOUT  . HYPERKALEMIA  . HYPERTENSION  . Atrial fibrillation  . CONGESTIVE HEART FAILURE  . VENOUS INSUFFICIENCY  . BACK PAIN, LUMBAR  . MEMORY LOSS  . EDEMA  . FOOT PAIN, BILATERAL  . COMBINED HEART FAILURE, ACUTE ON CHRONIC  . SHORTNESS OF BREATH  . ALKALINE PHOSPHATASE, ELEVATED  . Aortic insufficiency and aortic stenosis  . Acute on chronic renal insufficiency  . Cardiac arrest  . Automatic implantable cardiac defibrillator single-chamber-Medtronic  . Alopecia  . Lower limb ischemia  . Acute on chronic systolic congestive heart failure, NYHA class 3  . Acute on chronic kidney disease, stage 3  . Constipation  . Anemia  . Atherosclerosis of native arteries of the extremities with intermittent claudication  . Hypokalemia  . Heart valve replaced by other means    History  Smoking status  . Never Smoker   Smokeless tobacco  . Never Used    History  Alcohol Use No    Family History  Problem Relation Age of Onset  . Hypertension Father   . Cancer Father   . Coronary artery disease Father 41    Review of Systems: Constitutional: no fever chills diaphoresis or fatigue or change in weight.  Head and neck: no hearing loss, no epistaxis, no photophobia or visual disturbance. Respiratory: No cough, shortness of breath or wheezing. Cardiovascular: No chest pain peripheral edema, palpitations. Gastrointestinal: No abdominal distention, no abdominal pain, no change in bowel habits hematochezia or melena. Genitourinary: No dysuria, no frequency, no urgency, no nocturia. Musculoskeletal:No arthralgias, no back pain, no gait disturbance or  myalgias. Neurological: No dizziness, no headaches, no numbness, no seizures, no syncope, no weakness, no tremors. Hematologic: No lymphadenopathy, no easy bruising. Psychiatric: No confusion, no hallucinations, no sleep disturbance.    Physical Exam: Filed Vitals:   05/07/12 1531  BP: 118/80  Pulse: 85   the general appearance reveals a pleasant elderly woman who is showing some evidence of a decreased memory.  She is not very aware of the medication that she is taking.Pupils equal and reactive.   Extraocular Movements are full.  There is no scleral icterus.  The mouth and pharynx are normal.  The neck is supple.  The carotids reveal no bruits.  The jugular venous pressure is normal.  The thyroid is not enlarged.  There is no lymphadenopathy.  The chest is clear to percussion and auscultation. There are no rales or rhonchi. Expansion of the chest is symmetrical.  The heart reveals no aortic insufficiency.The abdomen is soft and nontender. Bowel sounds are normal. The liver and spleen are not enlarged. There Are no abdominal masses. There are no bruits.  The pedal pulses are good.  There is no phlebitis or edema.  There is no cyanosis or clubbing. Strength is normal and symmetrical in all extremities.  There is no lateralizing weakness.  There are no sensory deficits.  The skin is warm and dry.  There is no rash.  EKG shows atrial fibrillation with left ventricular hypertrophy QRS widening and marked ST abnormality   Assessment / Plan: Plan: Continue same medication.  Recheck in 6 weeks for followup office visit CBC and basal metabolic panel.  We will review the indications for continuing amiodarone long-term at her next visit.  This was not something she was on prior to surgery.

## 2012-05-07 NOTE — Assessment & Plan Note (Signed)
Blood pressure is staying stable on current therapy 

## 2012-05-07 NOTE — Patient Instructions (Signed)
Your physician recommends that you continue on your current medications as directed. Please refer to the Current Medication list given to you today.  Your physician recommends that you schedule a follow-up appointment in: 6 weeks for ov and labs (cbc/bmet)

## 2012-05-11 ENCOUNTER — Other Ambulatory Visit: Payer: Self-pay | Admitting: Cardiothoracic Surgery

## 2012-05-11 ENCOUNTER — Ambulatory Visit: Payer: Self-pay | Admitting: Cardiovascular Disease

## 2012-05-11 DIAGNOSIS — Z954 Presence of other heart-valve replacement: Secondary | ICD-10-CM

## 2012-05-11 DIAGNOSIS — I352 Nonrheumatic aortic (valve) stenosis with insufficiency: Secondary | ICD-10-CM

## 2012-05-11 DIAGNOSIS — I712 Thoracic aortic aneurysm, without rupture: Secondary | ICD-10-CM

## 2012-05-11 DIAGNOSIS — I4891 Unspecified atrial fibrillation: Secondary | ICD-10-CM

## 2012-05-13 ENCOUNTER — Ambulatory Visit (INDEPENDENT_AMBULATORY_CARE_PROVIDER_SITE_OTHER): Payer: Medicare Other | Admitting: *Deleted

## 2012-05-13 ENCOUNTER — Ambulatory Visit
Admission: RE | Admit: 2012-05-13 | Discharge: 2012-05-13 | Disposition: A | Discharge status: Home / Self Care | Payer: Medicare Other | Source: Ambulatory Visit | Attending: Cardiothoracic Surgery | Admitting: Cardiothoracic Surgery

## 2012-05-13 ENCOUNTER — Ambulatory Visit (INDEPENDENT_AMBULATORY_CARE_PROVIDER_SITE_OTHER): Payer: Self-pay | Admitting: Cardiothoracic Surgery

## 2012-05-13 ENCOUNTER — Encounter: Payer: Self-pay | Admitting: Internal Medicine

## 2012-05-13 ENCOUNTER — Encounter: Payer: Self-pay | Admitting: Cardiothoracic Surgery

## 2012-05-13 VITALS — BP 131/92 | HR 62 | Resp 20 | Ht 62.0 in | Wt 164.0 lb

## 2012-05-13 DIAGNOSIS — I469 Cardiac arrest, cause unspecified: Secondary | ICD-10-CM

## 2012-05-13 DIAGNOSIS — Z954 Presence of other heart-valve replacement: Secondary | ICD-10-CM

## 2012-05-13 DIAGNOSIS — Z952 Presence of prosthetic heart valve: Secondary | ICD-10-CM

## 2012-05-13 DIAGNOSIS — Z95828 Presence of other vascular implants and grafts: Secondary | ICD-10-CM

## 2012-05-13 DIAGNOSIS — Z95810 Presence of automatic (implantable) cardiac defibrillator: Secondary | ICD-10-CM

## 2012-05-13 DIAGNOSIS — I712 Thoracic aortic aneurysm, without rupture: Secondary | ICD-10-CM

## 2012-05-13 DIAGNOSIS — Z9889 Other specified postprocedural states: Secondary | ICD-10-CM

## 2012-05-13 DIAGNOSIS — I351 Nonrheumatic aortic (valve) insufficiency: Secondary | ICD-10-CM

## 2012-05-13 DIAGNOSIS — I350 Nonrheumatic aortic (valve) stenosis: Secondary | ICD-10-CM

## 2012-05-13 DIAGNOSIS — I509 Heart failure, unspecified: Secondary | ICD-10-CM

## 2012-05-13 DIAGNOSIS — I5023 Acute on chronic systolic (congestive) heart failure: Secondary | ICD-10-CM

## 2012-05-13 DIAGNOSIS — I359 Nonrheumatic aortic valve disorder, unspecified: Secondary | ICD-10-CM

## 2012-05-13 NOTE — Progress Notes (Signed)
[address]            [place] [address]          [phone]       RONETTE HANK [facility] Medical Record #[id number] Date of Birth: 1940/12/19  Dr Patty Sermons, MD [name], [name], MD  Chief Complaint:   PostOp Follow Up Visit 04/12/2012  DATE OF DISCHARGE:  OPERATIVE REPORT  PREOPERATIVE DIAGNOSES: Severe aortic insufficiency with severely  depressed left ventricular function and dilated ascending aorta and  history of atrial fibrillation.  POSTOPERATIVE DIAGNOSES: Severe aortic insufficiency with severely  depressed left ventricular function and dilated ascending aorta and  history of atrial fibrillation.  SURGICAL PROCEDURE: Aortic valve replacement with a 21-mm pericardial  tissue valve, [employer] model 3300TFX, serial number [id number].  Replacement of ascending aorta, supracoronary with a 32-mm Hemashield  graft and placement of 30-mm atrial clip. Fluoroscopy for repositioning  of Swan-Ganz catheter.   History of Present Illness:      Doing better since surgery. Wounds healing well. Pt still coming to her home. She is slowly increasing her physical activity has some shortness of breath with exertion but overall this is improving.  She has had some mild pedal edema but this has not been bothering her. Still has some difficulty in regulating her Coumadin last INR is 52 days ago.        History  Smoking status  . Never Smoker   Smokeless tobacco  . Never Used       Allergies  Allergen Reactions  . Latex Rash  . Neomycin-Bacitracin Zn-Polymyx Rash    Current Outpatient Prescriptions  Medication Sig Dispense Refill  . allopurinol (ZYLOPRIM) 100 MG tablet Take 100 mg by mouth daily.       [name] ALPRAZolam (XANAX) 0.5 MG tablet Take 0.5 mg by mouth 3 (three) times daily as needed. For anxiety.      [name] amiodarone (PACERONE) 200 MG tablet Take 1 tablet (200 mg total) by mouth daily.  30 tablet  1  .  aspirin EC 81 MG EC tablet Take 1 tablet (81 mg total) by mouth daily.      . Biotin 5000 MCG CAPS Take 1 capsule by mouth daily.      . carvedilol (COREG) 6.25 MG tablet Take 1 tablet (6.25 mg total) by mouth 2 (two) times daily with a meal.  60 tablet  5  . ferrous sulfate 325 (65 FE) MG tablet Take 325 mg by mouth daily with breakfast.       . furosemide (LASIX) 40 MG tablet Take 1 tablet (40 mg total) by mouth daily.  30 tablet  1  . NITROSTAT 0.4 MG SL tablet as needed.      [name] oxyCODONE (OXY IR/ROXICODONE) 5 MG immediate release tablet as needed.      . potassium chloride SA (K-DUR,KLOR-CON) 20 MEQ tablet Take 20 mEq by mouth daily.      [name] warfarin (COUMADIN) 5 MG tablet Take 1 tablet (5 mg total) by mouth daily.  100 tablet  1  . DISCONTD: allopurinol (ZYLOPRIM) 100 MG tablet Take 1 tablet (100 mg total) by mouth daily.  90 tablet  3       Physical Exam: BP 131/92  Pulse 62  Resp 20  Ht 5\' 2"  (1.575 m)  Wt 164 lb (74.39 kg)  BMI 30.00  kg/m2  SpO2 98%  General appearance: alert and cooperative Neurologic: intact Heart: normal apical impulse and irregularly irregular rhythm Lungs: clear to auscultation bilaterally and normal percussion bilaterally Abdomen: soft, non-tender; bowel sounds normal; no masses,  no organomegaly Extremities: extremities normal, atraumatic, no cyanosis or edema, Homans sign is negative, no sign of DVT and mild pedal edema Wound: sternum intact and healing well   Diagnostic Studies & Laboratory data:         Recent Radiology Findings: Dg Chest 2 View  05/13/2012  *RADIOLOGY REPORT*  Clinical Data: 71 year old female - follow-up aortic valve replacement  CHEST - 2 VIEW  Comparison: 04/16/2012 and prior chest radiographs  Findings: Cardiomegaly, aortic valve replacement and left AICD again noted. Minimal left basilar atelectasis is noted, improved since prior study. There is no evidence of focal airspace disease, pulmonary edema, suspicious pulmonary  nodule/mass, pleural effusion, or pneumothorax. No acute bony abnormalities are identified.  IMPRESSION: Cardiomegaly and aortic valve replacement with minimal residual left basilar atelectasis.  Original Report Authenticated By: Rosendo Gros, M.D.      Recent Labs: Lab Results  Component Value Date   WBC 6.2 04/19/2012   HGB 8.5* 04/19/2012   HCT 25.5* 04/19/2012   PLT 211 04/19/2012   GLUCOSE 84 04/27/2012   CHOL 195 03/30/2012   TRIG 82.0 03/30/2012   HDL 57.30 03/30/2012   LDLDIRECT 152.6 09/01/2011   LDLCALC 121* 03/30/2012   ALT 12 04/06/2012   AST 23 04/06/2012   NA 139 04/27/2012   K 4.5 04/27/2012   CL 99 04/27/2012   CREATININE 1.1 04/27/2012   BUN 18 04/27/2012   CO2 30 04/27/2012   TSH 1.786 04/06/2012   INR 5.1 05/11/2012   HGBA1C 5.2 04/12/2012      Assessment / Plan:      Doing well following aortic valve and ascending aortic replacement for severe aortic insufficiency with LV dysfunction. She continues on Coumadin for atrial fibrillation. We did place an atrial clip at the time of surgery to decrease risk of atrial appendage clot. I plan to see her back in 8 weeks      Delight Ovens MD 05/13/2012 3:27 PM

## 2012-05-14 LAB — REMOTE ICD DEVICE
BRDY-0002RV: 50 {beats}/min
CHARGE TIME: 9.629 s
PACEART VT: 0
RV LEAD AMPLITUDE: 9.3 mv
TOT-0002: 0
TOT-0006: 20091020000000
TZAT-0011SLOWVT: 10 ms
TZAT-0011SLOWVT: 10 ms
TZAT-0012SLOWVT: 200 ms
TZAT-0012SLOWVT: 200 ms
TZAT-0018SLOWVT: NEGATIVE
TZAT-0018SLOWVT: NEGATIVE
TZAT-0019SLOWVT: 8 V
TZAT-0019SLOWVT: 8 V
TZAT-0020FASTVT: 1.5 ms
TZON-0003SLOWVT: 330 ms
TZON-0005SLOWVT: 12
TZST-0001FASTVT: 2
TZST-0001FASTVT: 3
TZST-0001SLOWVT: 4
TZST-0001SLOWVT: 6
TZST-0002FASTVT: NEGATIVE
TZST-0003SLOWVT: 15 J
TZST-0003SLOWVT: 35 J

## 2012-05-17 ENCOUNTER — Other Ambulatory Visit: Payer: Self-pay | Admitting: Cardiology

## 2012-05-18 ENCOUNTER — Ambulatory Visit: Payer: Self-pay | Admitting: Cardiology

## 2012-05-18 DIAGNOSIS — Z954 Presence of other heart-valve replacement: Secondary | ICD-10-CM

## 2012-05-18 DIAGNOSIS — I352 Nonrheumatic aortic (valve) stenosis with insufficiency: Secondary | ICD-10-CM

## 2012-05-18 DIAGNOSIS — I4891 Unspecified atrial fibrillation: Secondary | ICD-10-CM

## 2012-05-18 LAB — POCT INR: INR: 4.6

## 2012-05-25 ENCOUNTER — Ambulatory Visit: Payer: Self-pay | Admitting: Cardiology

## 2012-05-25 DIAGNOSIS — Z954 Presence of other heart-valve replacement: Secondary | ICD-10-CM

## 2012-05-25 DIAGNOSIS — I4891 Unspecified atrial fibrillation: Secondary | ICD-10-CM

## 2012-05-25 DIAGNOSIS — I352 Nonrheumatic aortic (valve) stenosis with insufficiency: Secondary | ICD-10-CM

## 2012-05-25 LAB — POCT INR: INR: 2.6

## 2012-05-27 ENCOUNTER — Encounter: Payer: Self-pay | Admitting: *Deleted

## 2012-06-01 ENCOUNTER — Encounter: Payer: Self-pay | Admitting: Cardiology

## 2012-06-01 ENCOUNTER — Ambulatory Visit (INDEPENDENT_AMBULATORY_CARE_PROVIDER_SITE_OTHER): Payer: Medicare Other | Admitting: Cardiology

## 2012-06-01 ENCOUNTER — Telehealth: Payer: Self-pay | Admitting: Cardiology

## 2012-06-01 VITALS — BP 139/101 | HR 75 | Ht 62.0 in | Wt 167.8 lb

## 2012-06-01 DIAGNOSIS — Z79899 Other long term (current) drug therapy: Secondary | ICD-10-CM

## 2012-06-01 DIAGNOSIS — M109 Gout, unspecified: Secondary | ICD-10-CM

## 2012-06-01 DIAGNOSIS — I509 Heart failure, unspecified: Secondary | ICD-10-CM

## 2012-06-01 DIAGNOSIS — Z8639 Personal history of other endocrine, nutritional and metabolic disease: Secondary | ICD-10-CM

## 2012-06-01 DIAGNOSIS — Z8739 Personal history of other diseases of the musculoskeletal system and connective tissue: Secondary | ICD-10-CM

## 2012-06-01 DIAGNOSIS — I4891 Unspecified atrial fibrillation: Secondary | ICD-10-CM

## 2012-06-01 LAB — CBC WITH DIFFERENTIAL/PLATELET
Basophils Relative: 0.7 % (ref 0.0–3.0)
Eosinophils Relative: 6.7 % — ABNORMAL HIGH (ref 0.0–5.0)
Hemoglobin: 11.8 g/dL — ABNORMAL LOW (ref 12.0–15.0)
Lymphocytes Relative: 27.3 % (ref 12.0–46.0)
MCHC: 31.7 g/dL (ref 30.0–36.0)
Monocytes Relative: 14 % — ABNORMAL HIGH (ref 3.0–12.0)
Neutro Abs: 3.5 10*3/uL (ref 1.4–7.7)
RBC: 4.03 Mil/uL (ref 3.87–5.11)
WBC: 6.8 10*3/uL (ref 4.5–10.5)

## 2012-06-01 LAB — BASIC METABOLIC PANEL
BUN: 30 mg/dL — ABNORMAL HIGH (ref 6–23)
CO2: 26 mEq/L (ref 19–32)
Chloride: 102 mEq/L (ref 96–112)
Creatinine, Ser: 1.9 mg/dL — ABNORMAL HIGH (ref 0.4–1.2)
Glucose, Bld: 77 mg/dL (ref 70–99)
Potassium: 3.8 mEq/L (ref 3.5–5.1)

## 2012-06-01 NOTE — Telephone Encounter (Signed)
Walk In Pt Form " Genevieve Norlander paper dropped Off By [employer]" Sent to  Melinda/Dr.Brackbill For Completion  06/01/12/KM

## 2012-06-01 NOTE — Progress Notes (Signed)
[name] Date of Birth:  1941/02/09 [facility] [address] [place], [place]  [address] [phone]         Fax   [phone]  History of Present Illness: This pleasant 71 year old woman is seen for a post hospital office visit. She recently underwent replacement of her aortic valve and ascending aortic root by Dr. [name]. 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 She also has a dilated left ventricle with marked decrease in left ventricular systolic function. She is now on carvedilol 6.2 mg by mouth twice a day. Her symptoms of congestive heart failure have improved significantly since surgery. She is no longer having any ankle edema or significant orthopnea. Her weight is down since preop. She remains in chronic atrial fibrillation and is now on Coumadin.   Current Outpatient Prescriptions  Medication Sig Dispense Refill  . allopurinol (ZYLOPRIM) 100 MG tablet Take 100 mg by mouth daily.       [name] ALPRAZolam (XANAX) 0.5 MG tablet Take 0.5 mg by mouth 3 (three) times daily as needed. For anxiety.      [name] aspirin EC 81 MG EC tablet Take 1 tablet (81 mg total) by mouth daily.      . Biotin 5000 MCG CAPS Take 1 capsule by mouth daily.      . carvedilol (COREG) 6.25 MG tablet Take 1 tablet (6.25 mg total) by mouth 2 (two) times daily with a meal.  60 tablet  5  . ferrous sulfate 325 (65 FE) MG tablet Take 325 mg by mouth daily with breakfast.       . furosemide (LASIX) 40 MG tablet Take 1 tablet (40 mg total) by mouth daily.  30 tablet  1  . NITROSTAT 0.4 MG SL tablet as needed.      [name] oxyCODONE (OXY IR/ROXICODONE) 5 MG immediate release tablet as needed.      . potassium chloride SA (K-DUR,KLOR-CON) 20 MEQ tablet Take 20 mEq by mouth daily.      [name] warfarin (COUMADIN) 5 MG tablet Take 1 tablet (5 mg total) by mouth daily.  100 tablet  1  . DISCONTD: allopurinol (ZYLOPRIM) 100 MG tablet Take 1 tablet  (100 mg total) by mouth daily.  90 tablet  3    Allergies  Allergen Reactions  . Latex Rash  . Neomycin-Bacitracin Zn-Polymyx Rash    Patient Active Problem List  Diagnosis  . HYPERLIPIDEMIA  . GOUT  . HYPERKALEMIA  . HYPERTENSION  . Atrial fibrillation  . CONGESTIVE HEART FAILURE  . VENOUS INSUFFICIENCY  . BACK PAIN, LUMBAR  . MEMORY LOSS  . EDEMA  . FOOT PAIN, BILATERAL  . COMBINED HEART FAILURE, ACUTE ON CHRONIC  . SHORTNESS OF BREATH  . ALKALINE PHOSPHATASE, ELEVATED  . Aortic insufficiency and aortic stenosis  . Acute on chronic renal insufficiency  . Cardiac arrest  . Automatic implantable cardiac defibrillator single-chamber-Medtronic  . Alopecia  . Lower limb ischemia  . Acute on chronic systolic congestive heart failure, NYHA class 3  . Acute on chronic kidney disease, stage 3  . Constipation  . Anemia  . Atherosclerosis of native arteries of the extremities with intermittent claudication  . Hypokalemia  . Heart valve replaced by other means    History  Smoking status  . Never Smoker   Smokeless tobacco  . Never Used    History  Alcohol Use No    Family History  Problem Relation  Age of Onset  . Hypertension Father   . Cancer Father   . Coronary artery disease Father 6    Review of Systems: Constitutional: no fever chills diaphoresis or fatigue or change in weight.  Head and neck: no hearing loss, no epistaxis, no photophobia or visual disturbance. Respiratory: No cough, shortness of breath or wheezing. Cardiovascular: No chest pain peripheral edema, palpitations. Gastrointestinal: No abdominal distention, no abdominal pain, no change in bowel habits hematochezia or melena. Genitourinary: No dysuria, no frequency, no urgency, no nocturia. Musculoskeletal:No arthralgias, no back pain, no gait disturbance or myalgias. Neurological: No dizziness, no headaches, no numbness, no seizures, no syncope, no weakness, no tremors. Hematologic: No  lymphadenopathy, no easy bruising. Psychiatric: No confusion, no hallucinations, no sleep disturbance.    Physical Exam: Filed Vitals:   06/01/12 1204  BP: 139/101  Pulse: 75   the general appearance reveals a well-developed well-nourished woman in no distress.  Repeat blood pressure is 132/94.Pupils equal and reactive.   Extraocular Movements are full.  There is no scleral icterus.  The mouth and pharynx are normal.  The neck is supple.  The carotids reveal no bruits.  The jugular venous pressure is normal.  The thyroid is not enlarged.  There is no lymphadenopathy.  The chest is clear to percussion and auscultation. There are no rales or rhonchi. Expansion of the chest is symmetrical.  Heart reveals a soft systolic ejection murmur across the prosthetic aortic valve.  No diastolic murmur.  No gallop or rub. She has a defibrillator positioned in the left upper chest. The abdomen is soft and nontender. Bowel sounds are normal. The liver and spleen are not enlarged. There Are no abdominal masses. There are no bruits.  The pedal pulses are good.  There is no phlebitis or edema.  There is no cyanosis or clubbing. Strength is normal and symmetrical in all extremities.  There is no lateralizing weakness.  There are no sensory deficits.  The skin is warm and dry.  There is no rash.   Assessment / Plan: The patient has established chronic atrial fibrillation.  We will stop her postoperative amiodarone at this point.  Continue other medicines as is.  The home health nurse will stop coming out next week and we will have the patient followup in our Coumadin clinic in about 3 weeks.  Return for followup office visit and EKG in 2 months.

## 2012-06-01 NOTE — Assessment & Plan Note (Signed)
The patient has had long-standing atrial fibrillation.  She is still in atrial fibrillation today with a controlled ventricular response.  We will stop her amiodarone at this point.

## 2012-06-01 NOTE — Assessment & Plan Note (Signed)
Her symptoms of congestive heart failure have resolved since her aortic valve replacement.  She is not having a peripheral edema now.  She is not having any significant exertional dyspnea.

## 2012-06-01 NOTE — Patient Instructions (Addendum)
Will obtain labs today and call you with the results (cbc/bmet/tsh)  Your physician recommends that you schedule a follow-up appointment in: 2 months/ekg  When home health nurse stops coming to your house will have you come here for coumadin checks  STOP AMIODARONE

## 2012-06-01 NOTE — Progress Notes (Signed)
Quick Note:  Please report to patient. The recent labs are stable. Continue same medication and careful diet. Anemia is better. Thyroid is normal. CSD. ______

## 2012-06-01 NOTE — Assessment & Plan Note (Signed)
The patient has not had any flareup of gout.  Remains on allopurinol 100 mg daily

## 2012-06-03 ENCOUNTER — Telehealth: Payer: Self-pay | Admitting: *Deleted

## 2012-06-03 NOTE — Telephone Encounter (Signed)
Advised patient of lab results  

## 2012-06-03 NOTE — Telephone Encounter (Signed)
Message copied by Burnell Blanks on Thu Jun 03, 2012  4:34 PM ------      Message from: [name]      Created: Tue Jun 01, 2012  9:17 PM       Please report to patient.  The recent labs are stable. Continue same medication and careful diet.  Anemia is better. Thyroid is normal. CSD.

## 2012-06-04 ENCOUNTER — Ambulatory Visit: Payer: Self-pay | Admitting: Cardiovascular Disease

## 2012-06-04 DIAGNOSIS — Z954 Presence of other heart-valve replacement: Secondary | ICD-10-CM

## 2012-06-04 DIAGNOSIS — I352 Nonrheumatic aortic (valve) stenosis with insufficiency: Secondary | ICD-10-CM

## 2012-06-04 DIAGNOSIS — I4891 Unspecified atrial fibrillation: Secondary | ICD-10-CM

## 2012-06-14 ENCOUNTER — Ambulatory Visit (INDEPENDENT_AMBULATORY_CARE_PROVIDER_SITE_OTHER): Payer: Medicare Other | Admitting: Cardiology

## 2012-06-14 DIAGNOSIS — I352 Nonrheumatic aortic (valve) stenosis with insufficiency: Secondary | ICD-10-CM

## 2012-06-14 DIAGNOSIS — I359 Nonrheumatic aortic valve disorder, unspecified: Secondary | ICD-10-CM

## 2012-06-14 DIAGNOSIS — Z954 Presence of other heart-valve replacement: Secondary | ICD-10-CM

## 2012-06-14 DIAGNOSIS — I4891 Unspecified atrial fibrillation: Secondary | ICD-10-CM

## 2012-06-18 ENCOUNTER — Other Ambulatory Visit: Payer: Self-pay | Admitting: *Deleted

## 2012-06-18 ENCOUNTER — Encounter: Payer: Self-pay | Admitting: Nurse Practitioner

## 2012-06-18 ENCOUNTER — Ambulatory Visit (INDEPENDENT_AMBULATORY_CARE_PROVIDER_SITE_OTHER): Payer: Medicare Other | Admitting: Nurse Practitioner

## 2012-06-18 ENCOUNTER — Other Ambulatory Visit (INDEPENDENT_AMBULATORY_CARE_PROVIDER_SITE_OTHER): Payer: Medicare Other

## 2012-06-18 VITALS — BP 136/94 | HR 84 | Ht 62.0 in | Wt 172.0 lb

## 2012-06-18 DIAGNOSIS — I359 Nonrheumatic aortic valve disorder, unspecified: Secondary | ICD-10-CM

## 2012-06-18 DIAGNOSIS — I4891 Unspecified atrial fibrillation: Secondary | ICD-10-CM

## 2012-06-18 DIAGNOSIS — Z952 Presence of prosthetic heart valve: Secondary | ICD-10-CM

## 2012-06-18 DIAGNOSIS — Z954 Presence of other heart-valve replacement: Secondary | ICD-10-CM

## 2012-06-18 LAB — CBC WITH DIFFERENTIAL/PLATELET
Eosinophils Relative: 6.7 % — ABNORMAL HIGH (ref 0.0–5.0)
HCT: 37.4 % (ref 36.0–46.0)
Hemoglobin: 12 g/dL (ref 12.0–15.0)
Lymphs Abs: 1.9 10*3/uL (ref 0.7–4.0)
MCV: 91.4 fl (ref 78.0–100.0)
Monocytes Absolute: 0.8 10*3/uL (ref 0.1–1.0)
Neutro Abs: 3.1 10*3/uL (ref 1.4–7.7)
Platelets: 316 10*3/uL (ref 150.0–400.0)
RDW: 16.9 % — ABNORMAL HIGH (ref 11.5–14.6)

## 2012-06-18 LAB — BASIC METABOLIC PANEL
BUN: 28 mg/dL — ABNORMAL HIGH (ref 6–23)
CO2: 28 mEq/L (ref 19–32)
Calcium: 9.1 mg/dL (ref 8.4–10.5)
Chloride: 103 mEq/L (ref 96–112)
Creatinine, Ser: 1.4 mg/dL — ABNORMAL HIGH (ref 0.4–1.2)
GFR: 38.21 mL/min — ABNORMAL LOW (ref >60.00)
Glucose, Bld: 83 mg/dL (ref 70–99)
Potassium: 4.1 mEq/L (ref 3.5–5.1)
Sodium: 139 mEq/L (ref 135–145)

## 2012-06-18 NOTE — Patient Instructions (Addendum)
We will get an echo in November with your next visit with Dr. Patty Sermons  Call Dr. Rodena Medin to see if he will monitor your coumadin  Stay on your current medicines  Activities are as you feel  Call the  Heart Care office at [phone] if you have any questions, problems or concerns.

## 2012-06-18 NOTE — Progress Notes (Signed)
[name] Date of Birth: [birth year] Medical Record #[id number]  History of Present Illness: [name] is seen today for a 3 week check. She is seen for Dr. Patty Sermons. She is a 71 year old female with multiple issues. She has most recently had AVR with placement of the ascending aortic root by Dr. [name]. She has a bioprosthetic valve. Her other problems include diastolic heart failure, VT with ICD in place, HTN, PVD, and chronic anticoagulation with coumadin. She has chronic atrial fibrillation and is managed with rate control and anticoagulation.  She comes in today. She is here with her grand daughter. She continues to do very well. Feeling stronger. Getting out and going shopping and to the movies. Still with some mild chest soreness. Not short of breath. BP has been good at home. Home health will be stopping as of next week. She would like to get her coumadin checked at Dr. Ilda Foil office due to issues with transportation. Overall, she feels like she continues to make good progress.   Current Outpatient Prescriptions on File Prior to Visit  Medication Sig Dispense Refill  . allopurinol (ZYLOPRIM) 100 MG tablet Take 100 mg by mouth daily.       [name] ALPRAZolam (XANAX) 0.5 MG tablet Take 0.5 mg by mouth 3 (three) times daily as needed. For anxiety.      [name] aspirin EC 81 MG EC tablet Take 1 tablet (81 mg total) by mouth daily.      . Biotin 5000 MCG CAPS Take 1 capsule by mouth daily.      . carvedilol (COREG) 6.25 MG tablet Take 1 tablet (6.25 mg total) by mouth 2 (two) times daily with a meal.  60 tablet  5  . ferrous sulfate 325 (65 FE) MG tablet Take 325 mg by mouth daily with breakfast.       . furosemide (LASIX) 40 MG tablet Take 1 tablet (40 mg total) by mouth daily.  30 tablet  1  . NITROSTAT 0.4 MG SL tablet as needed.      [name] oxyCODONE (OXY IR/ROXICODONE) 5 MG immediate release tablet as needed.      . potassium chloride SA (K-DUR,KLOR-CON) 20 MEQ tablet Take 20 mEq by mouth  daily.      [name] warfarin (COUMADIN) 5 MG tablet Take 1 tablet (5 mg total) by mouth daily.  100 tablet  1  . DISCONTD: allopurinol (ZYLOPRIM) 100 MG tablet Take 1 tablet (100 mg total) by mouth daily.  90 tablet  3    Allergies  Allergen Reactions  . Adhesive (Tape)   . Latex Rash  . Neomycin-Bacitracin Zn-Polymyx Rash    Past Medical History  Diagnosis Date  . History of ventricular tachycardia   . Peripheral edema   . Aortic insufficiency     Moderate 2012  . History of gout   . Exogenous obesity   . History of CHF (congestive heart failure)     EF 45% 2012, nonischemic (Normal coronaries 2002)  . 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 Hypertension   . 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 ICD (implantable cardiac defibrillator) in place 2012  . DOE (dyspnea on exertion)   . Stroke 2000  . Renal insufficiency   . Myocardial infarction 2000  . Anxiety   . 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 Anemia   . Presence of IVC  filter 2000    Past Surgical History  Procedure Date  . Cardiac defibrillator placement   . Cataract extraction   . Embolectomy 02/15/2012    Procedure: EMBOLECTOMY;  Surgeon: [name], MD;  Location: [facility] OR;  Service: Vascular;  Laterality: Left;  left popliteal embolectomy with drain placement  . Insert / replace / remove pacemaker   . Eye surgery 2012    removal of cataracts bilaterally  . Cardiac catheterization   . Tonsillectomy   . Aortic valve replacement 04/12/2012    Procedure: AORTIC VALVE REPLACEMENT (AVR);  Surgeon: Delight Ovens, MD;  Location: [facility] OR;  Service: Open Heart Surgery;  Laterality: N/A;  with nitric oxide  . Thoracic aortic aneurysm repair 04/12/2012    Procedure: THORACIC ASCENDING ANEURYSM REPAIR (AAA);  Surgeon: Delight Ovens, MD;  Location: [facility] OR;  Service: Open Heart Surgery;  Laterality: N/A;     History  Smoking status  . Never Smoker   Smokeless tobacco  . Never Used    History  Alcohol Use No    Family History  Problem Relation Age of Onset  . Hypertension Father   . Cancer Father   . Coronary artery disease Father 90    Review of Systems: The review of systems is per the HPI.  All other systems were reviewed and are negative.  Physical Exam: BP 136/94  Pulse 84  Ht 5\' 2"  (1.575 m)  Wt 172 lb (78.019 kg)  BMI 31.46 kg/m2 Patient is very pleasant and in no acute distress. Skin is warm and dry. Color is normal.  HEENT is unremarkable. Normocephalic/atraumatic. PERRL. Sclera are nonicteric. Neck is supple. No masses. No JVD. Lungs are clear. Sternum looks good. Cardiac exam shows an irregular rhythm. Rate is controlled. Abdomen is soft. Extremities are without edema. Gait and ROM are intact. No gross neurologic deficits noted.  LABORATORY DATA: BMET is pending.   Lab Results  Component Value Date   WBC 6.8 06/01/2012   HGB 11.8* 06/01/2012   HCT 37.1 06/01/2012   PLT 281.0 06/01/2012   GLUCOSE 77 06/01/2012   CHOL 195 03/30/2012   TRIG 82.0 03/30/2012   HDL 57.30 03/30/2012   LDLDIRECT 152.6 09/01/2011   LDLCALC 121* 03/30/2012   ALT 12 04/06/2012   AST 23 04/06/2012   NA 140 06/01/2012   K 3.8 06/01/2012   CL 102 06/01/2012   CREATININE 1.9* 06/01/2012   BUN 30* 06/01/2012   CO2 26 06/01/2012   TSH 2.78 06/01/2012   INR 2.2 06/14/2012   HGBA1C 5.2 04/12/2012   Lab Results  Component Value Date   INR 2.2 06/14/2012   INR 2.2 06/04/2012   INR 2.6 05/25/2012    Assessment / Plan: 1. S/P AVR and replacement of the ascending aortic root - She is progressing quite nicely. Will check an echo with her next visit with Dr. Patty Sermons  2. Atrial fib - her rate is controlled. Off amiodarone. She remains on her coumadin. No adverse effects noted. She is wanting to get this followed by her primary care if possible. She is driving just limited distances.  3. CRI - recheck BMET today.    Overall, she is felt to be doing well. Will see her back in about 6 weeks. Check echo with next visit. Labs today.   Patient is agreeable to this plan and will call if any problems develop in the interim.

## 2012-06-22 ENCOUNTER — Telehealth: Payer: Self-pay | Admitting: *Deleted

## 2012-06-22 NOTE — Telephone Encounter (Signed)
Advised patient of lab results  

## 2012-06-22 NOTE — Telephone Encounter (Signed)
Message copied by Burnell Blanks on Tue Jun 22, 2012  9:50 AM ------      Message from: Rosalio Macadamia      Created: Fri Jun 18, 2012  4:57 PM       Ok to report. Labs are satisfactory.

## 2012-06-22 NOTE — Telephone Encounter (Signed)
Message copied by Burnell Blanks on Tue Jun 22, 2012  9:49 AM ------      Message from: [name]      Created: Sun Jun 20, 2012  4:35 PM       Hgb is now back to normal. Doreatha Martin out her present supply of ferrous sulphate then she can stop it.

## 2012-06-23 ENCOUNTER — Encounter: Payer: Self-pay | Admitting: Cardiology

## 2012-06-24 ENCOUNTER — Other Ambulatory Visit: Payer: Self-pay | Admitting: Cardiology

## 2012-06-24 DIAGNOSIS — Z1231 Encounter for screening mammogram for malignant neoplasm of breast: Secondary | ICD-10-CM

## 2012-07-08 ENCOUNTER — Ambulatory Visit (INDEPENDENT_AMBULATORY_CARE_PROVIDER_SITE_OTHER): Payer: Medicare Other | Admitting: Internal Medicine

## 2012-07-08 ENCOUNTER — Encounter: Payer: Self-pay | Admitting: Internal Medicine

## 2012-07-08 VITALS — HR 78 | Temp 97.5°F | Resp 14 | Ht 62.0 in | Wt 172.1 lb

## 2012-07-08 DIAGNOSIS — R413 Other amnesia: Secondary | ICD-10-CM

## 2012-07-08 DIAGNOSIS — R739 Hyperglycemia, unspecified: Secondary | ICD-10-CM

## 2012-07-08 DIAGNOSIS — Z23 Encounter for immunization: Secondary | ICD-10-CM

## 2012-07-08 DIAGNOSIS — Z0000 Encounter for general adult medical examination without abnormal findings: Secondary | ICD-10-CM

## 2012-07-08 DIAGNOSIS — R7309 Other abnormal glucose: Secondary | ICD-10-CM

## 2012-07-08 DIAGNOSIS — Z79899 Other long term (current) drug therapy: Secondary | ICD-10-CM

## 2012-07-08 MED ORDER — HYDROCOD POLST-CHLORPHEN POLST 10-8 MG/5ML PO LQCR: 5.0000 mL | Freq: Two times a day (BID) | ORAL | Status: DC | PRN

## 2012-07-08 NOTE — Progress Notes (Signed)
Subjective:    Patient ID: [name], female    DOB: Jun 22, 1941, 71 y.o.   MRN: [id number]  HPI patient presents to clinic for annual physical exam. States she was advised by a case manager through insurance company she needed a physical. Has not had a mammogram in over a year but declines currently. Not interested in Zostavax. Notes mildly decreased memory but no red flag symptoms-accompanying family member agrees.   Past Medical History  Diagnosis Date  . History of ventricular tachycardia   . Peripheral edema   . Aortic insufficiency     Moderate 2012  . History of gout   . Exogenous obesity   . History of CHF (congestive heart failure)     EF 45% 2012, nonischemic (Normal coronaries 2002)  . 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 Hypertension   . 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 ICD (implantable cardiac defibrillator) in place 2012  . DOE (dyspnea on exertion)   . Stroke 2000  . Renal insufficiency   . Myocardial infarction 2000  . Anxiety   . 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 Anemia   . Presence of IVC filter 2000   Past Surgical History  Procedure Date  . Cardiac defibrillator placement   . Cataract extraction   . Embolectomy 02/15/2012    Procedure: EMBOLECTOMY;  Surgeon: [name], MD;  Location: [facility] OR;  Service: Vascular;  Laterality: Left;  left popliteal embolectomy with drain placement  . Insert / replace / remove pacemaker   . Eye surgery 2012    removal of cataracts bilaterally  . Cardiac catheterization   . Tonsillectomy   . Aortic valve replacement 04/12/2012    Procedure: AORTIC VALVE REPLACEMENT (AVR);  Surgeon: Delight Ovens, MD;  Location: [facility] OR;  Service: Open Heart Surgery;  Laterality: N/A;  with nitric oxide  . Thoracic aortic aneurysm repair 04/12/2012    Procedure: THORACIC ASCENDING ANEURYSM REPAIR (AAA);   Surgeon: Delight Ovens, MD;  Location: [facility] OR;  Service: Open Heart Surgery;  Laterality: N/A;  . Clot removal 5.2013    blood clot removed from the back of the left leg  . Aortic valve surgery 7.2013    reports that she has never smoked. She has never used smokeless tobacco. She reports that she does not drink alcohol or use illicit drugs. family history includes Cancer in her father; Coronary artery disease (age of onset:60) in her father; and Hypertension in her father. Allergies  Allergen Reactions  . Adhesive (Tape)   . Latex Rash  . Neomycin-Bacitracin Zn-Polymyx Rash     Review of Systems see hpi     Objective:   Physical Exam  Nursing note and vitals reviewed. Constitutional: She appears well-developed and well-nourished. No distress.  HENT:  Head: Normocephalic and atraumatic.  Right Ear: Tympanic membrane, external ear and ear canal normal.  Left Ear: Tympanic membrane, external ear and ear canal normal.  Nose: Nose normal.  Mouth/Throat: Oropharynx is clear and moist. No oropharyngeal exudate.  Eyes: Conjunctivae normal and EOM are normal. Pupils are equal, round, and reactive to light. No scleral icterus.  Neck: Neck supple. No thyromegaly present.  Cardiovascular: Normal rate, regular rhythm and normal heart sounds.   Pulmonary/Chest: Effort normal and breath sounds normal. No respiratory distress. She has no wheezes. She has no rales.  Abdominal: Soft. Normal appearance  and bowel sounds are normal. She exhibits no distension and no mass. There is no hepatosplenomegaly. There is no tenderness. There is no rebound and no guarding.  Lymphadenopathy:    She has no cervical adenopathy.  Neurological: She is alert.  Skin: Skin is warm and dry. She is not diaphoretic.  Psychiatric: She has a normal mood and affect.          Assessment & Plan:

## 2012-07-08 NOTE — Assessment & Plan Note (Signed)
Administer Pneumovax booster and influenza vaccine. Declines Zostavax and mammogram-call if reconsiders. Obtain appropriate CPE labs. Schedule followup.

## 2012-07-09 LAB — HEPATIC FUNCTION PANEL
ALT: 8 U/L (ref 0–35)
Albumin: 3.9 g/dL (ref 3.5–5.2)
Alkaline Phosphatase: 97 U/L (ref 39–117)
Indirect Bilirubin: 0.2 mg/dL (ref 0.0–0.9)
Total Protein: 7.1 g/dL (ref 6.0–8.3)

## 2012-07-09 LAB — VITAMIN B12: Vitamin B-12: 397 pg/mL (ref 211–911)

## 2012-07-13 ENCOUNTER — Ambulatory Visit (INDEPENDENT_AMBULATORY_CARE_PROVIDER_SITE_OTHER): Payer: Medicare Other | Admitting: Pharmacist

## 2012-07-13 DIAGNOSIS — I359 Nonrheumatic aortic valve disorder, unspecified: Secondary | ICD-10-CM

## 2012-07-13 DIAGNOSIS — I4891 Unspecified atrial fibrillation: Secondary | ICD-10-CM

## 2012-07-13 DIAGNOSIS — Z954 Presence of other heart-valve replacement: Secondary | ICD-10-CM

## 2012-07-13 DIAGNOSIS — I352 Nonrheumatic aortic (valve) stenosis with insufficiency: Secondary | ICD-10-CM

## 2012-07-13 LAB — POCT INR: INR: 1.7

## 2012-07-15 ENCOUNTER — Ambulatory Visit (INDEPENDENT_AMBULATORY_CARE_PROVIDER_SITE_OTHER): Payer: Medicare Other | Admitting: Cardiothoracic Surgery

## 2012-07-15 ENCOUNTER — Encounter: Payer: Self-pay | Admitting: Cardiothoracic Surgery

## 2012-07-15 VITALS — BP 156/96 | HR 76 | Resp 18 | Ht 62.0 in | Wt 172.0 lb

## 2012-07-15 DIAGNOSIS — I519 Heart disease, unspecified: Secondary | ICD-10-CM

## 2012-07-15 DIAGNOSIS — Z952 Presence of prosthetic heart valve: Secondary | ICD-10-CM

## 2012-07-15 DIAGNOSIS — I359 Nonrheumatic aortic valve disorder, unspecified: Secondary | ICD-10-CM

## 2012-07-15 DIAGNOSIS — Z95828 Presence of other vascular implants and grafts: Secondary | ICD-10-CM

## 2012-07-15 DIAGNOSIS — I351 Nonrheumatic aortic (valve) insufficiency: Secondary | ICD-10-CM

## 2012-07-15 DIAGNOSIS — Z954 Presence of other heart-valve replacement: Secondary | ICD-10-CM

## 2012-07-15 DIAGNOSIS — Z9889 Other specified postprocedural states: Secondary | ICD-10-CM

## 2012-07-15 NOTE — Patient Instructions (Signed)
Doing better Continue to increase activity level

## 2012-07-15 NOTE — Progress Notes (Signed)
[address]            [place] [address]          [phone]        [name] [facility] Medical Record #[id number] Date of Birth: 04/17/1941  Dr Patty Sermons, MD [name], [name], MD  Chief Complaint:   PostOp Follow Up Visit 04/12/2012  DATE OF DISCHARGE:  OPERATIVE REPORT  PREOPERATIVE DIAGNOSES: Severe aortic insufficiency with severely  depressed left ventricular function and dilated ascending aorta and  history of atrial fibrillation.  POSTOPERATIVE DIAGNOSES: Severe aortic insufficiency with severely  depressed left ventricular function and dilated ascending aorta and  history of atrial fibrillation.  SURGICAL PROCEDURE: Aortic valve replacement with a 21-mm pericardial  tissue valve, [employer] model 3300TFX, serial number [id number].  Replacement of ascending aorta, supracoronary with a 32-mm Hemashield  graft and placement of 30-mm atrial clip. Fluoroscopy for repositioning  of Swan-Ganz catheter.   History of Present Illness:      Doing better since surgery. Wounds healing well. Pt still coming to her home. She is slowly increasing her physical activity has some shortness of breath with exertion but overall this is improving.  She has had some mild pedal edema but this has not been bothering her. Now driving.         History  Smoking status  . Never Smoker   Smokeless tobacco  . Never Used       Allergies  Allergen Reactions  . Adhesive (Tape)   . Latex Rash  . Neomycin-Bacitracin Zn-Polymyx Rash    Current Outpatient Prescriptions  Medication Sig Dispense Refill  . ALPRAZolam (XANAX) 0.5 MG tablet Take 0.5 mg by mouth 3 (three) times daily as needed. For anxiety.      [name] aspirin EC 81 MG EC tablet Take 1 tablet (81 mg total) by mouth daily.      . Biotin 5000 MCG CAPS Take 1 capsule by mouth daily.      . carvedilol (COREG) 6.25 MG tablet Take 1 tablet (6.25 mg total)  by mouth 2 (two) times daily with a meal.  60 tablet  5  . chlorpheniramine-HYDROcodone (TUSSIONEX PENNKINETIC ER) 10-8 MG/5ML LQCR Take 5 mLs by mouth every 12 (twelve) hours as needed.  140 mL  0  . ferrous sulfate 325 (65 FE) MG tablet Take 325 mg by mouth daily with breakfast.       . furosemide (LASIX) 40 MG tablet Take 1 tablet (40 mg total) by mouth daily.  30 tablet  1  . NITROSTAT 0.4 MG SL tablet as needed.      . potassium chloride SA (K-DUR,KLOR-CON) 20 MEQ tablet Take 20 mEq by mouth daily.      [name] warfarin (COUMADIN) 5 MG tablet Take 5 mg by mouth daily. This will be managed by coumadin clinic      . allopurinol (ZYLOPRIM) 100 MG tablet Take 100 mg by mouth daily.            Physical Exam: BP 156/96  Pulse 76  Resp 18  Ht 5\' 2"  (1.575 m)  Wt 172 lb (78.019 kg)  BMI 31.46 kg/m2  SpO2 98%  General appearance: alert and cooperative Neurologic: intact Heart: normal apical impulse and irregularly  irregular rhythm Lungs: clear to auscultation bilaterally and normal percussion bilaterally Abdomen: soft, non-tender; bowel sounds normal; no masses,  no organomegaly Extremities: extremities normal, atraumatic, no cyanosis or edema, Homans sign is negative, no sign of DVT and mild pedal edema Wound: sternum intact and healing well   Diagnostic Studies & Laboratory data:         Recent Radiology Findings: No results found.    Recent Labs: Lab Results  Component Value Date   WBC 6.3 06/18/2012   HGB 12.0 06/18/2012   HCT 37.4 06/18/2012   PLT 316.0 06/18/2012   GLUCOSE 83 06/18/2012   CHOL 195 03/30/2012   TRIG 82.0 03/30/2012   HDL 57.30 03/30/2012   LDLDIRECT 152.6 09/01/2011   LDLCALC 121* 03/30/2012   ALT 8 07/08/2012   AST 13 07/08/2012   NA 139 06/18/2012   K 4.1 06/18/2012   CL 103 06/18/2012   CREATININE 1.4* 06/18/2012   BUN 28* 06/18/2012   CO2 28 06/18/2012   TSH 2.78 06/01/2012   INR 1.7 07/13/2012   HGBA1C 5.6 07/08/2012      Assessment / Plan:      Doing well  following aortic valve and ascending aortic replacement for severe aortic insufficiency with LV dysfunction. She continues on Coumadin for atrial fibrillation. We did place an atrial clip at the time of surgery to decrease risk of atrial appendage clot. Appears to have significant less heart failure symptoms. I plan to see her back prn      Delight Ovens MD 07/15/2012 9:57 AM

## 2012-07-23 ENCOUNTER — Other Ambulatory Visit: Payer: Self-pay | Admitting: Cardiology

## 2012-07-26 ENCOUNTER — Ambulatory Visit
Admission: RE | Admit: 2012-07-26 | Discharge: 2012-07-26 | Disposition: A | Discharge status: Home / Self Care | Payer: Medicare Other | Source: Ambulatory Visit | Attending: Cardiology | Admitting: Cardiology

## 2012-07-26 DIAGNOSIS — Z1231 Encounter for screening mammogram for malignant neoplasm of breast: Secondary | ICD-10-CM

## 2012-07-29 ENCOUNTER — Other Ambulatory Visit: Payer: Self-pay

## 2012-07-30 ENCOUNTER — Other Ambulatory Visit: Payer: Self-pay | Admitting: Cardiology

## 2012-08-03 ENCOUNTER — Ambulatory Visit (INDEPENDENT_AMBULATORY_CARE_PROVIDER_SITE_OTHER): Payer: Medicare Other | Admitting: Cardiology

## 2012-08-03 ENCOUNTER — Encounter: Payer: Self-pay | Admitting: Cardiology

## 2012-08-03 ENCOUNTER — Ambulatory Visit (INDEPENDENT_AMBULATORY_CARE_PROVIDER_SITE_OTHER): Payer: Medicare Other | Admitting: *Deleted

## 2012-08-03 VITALS — BP 116/84 | HR 91 | Resp 18 | Ht 62.0 in | Wt 172.0 lb

## 2012-08-03 DIAGNOSIS — I4891 Unspecified atrial fibrillation: Secondary | ICD-10-CM

## 2012-08-03 DIAGNOSIS — I5043 Acute on chronic combined systolic (congestive) and diastolic (congestive) heart failure: Secondary | ICD-10-CM

## 2012-08-03 DIAGNOSIS — I359 Nonrheumatic aortic valve disorder, unspecified: Secondary | ICD-10-CM

## 2012-08-03 DIAGNOSIS — Z952 Presence of prosthetic heart valve: Secondary | ICD-10-CM

## 2012-08-03 DIAGNOSIS — Z95810 Presence of automatic (implantable) cardiac defibrillator: Secondary | ICD-10-CM

## 2012-08-03 DIAGNOSIS — I10 Essential (primary) hypertension: Secondary | ICD-10-CM

## 2012-08-03 DIAGNOSIS — Z954 Presence of other heart-valve replacement: Secondary | ICD-10-CM

## 2012-08-03 DIAGNOSIS — I352 Nonrheumatic aortic (valve) stenosis with insufficiency: Secondary | ICD-10-CM

## 2012-08-03 NOTE — Progress Notes (Signed)
[name] Date of Birth:  10-10-1940 [facility] [address] [place], [place]  [address] [phone]         Fax   [phone]  History of Present Illness: This pleasant 71 year old woman is seen for a post hospital office visit. She recently underwent replacement of her aortic valve and ascending aortic root by Dr. [name]. 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 She also has a dilated left ventricle with marked decrease in left ventricular systolic function. She is now on carvedilol 6.25 mg by mouth twice a day. Her symptoms of congestive heart failure have improved significantly since surgery. She is no longer having any ankle edema or significant orthopnea. Her weight is down since preop. She remains in chronic atrial fibrillation and is now on Coumadin.   Current Outpatient Prescriptions  Medication Sig Dispense Refill  . allopurinol (ZYLOPRIM) 100 MG tablet take 2 tablets by mouth daily  60 tablet  11  . ALPRAZolam (XANAX) 0.5 MG tablet Take 0.5 mg by mouth 3 (three) times daily as needed. For anxiety.      [name] aspirin EC 81 MG EC tablet Take 1 tablet (81 mg total) by mouth daily.      . Biotin 5000 MCG CAPS Take 1 capsule by mouth daily.      . carvedilol (COREG) 6.25 MG tablet Take 1 tablet (6.25 mg total) by mouth 2 (two) times daily with a meal.  60 tablet  5  . chlorpheniramine-HYDROcodone (TUSSIONEX PENNKINETIC ER) 10-8 MG/5ML LQCR Take 5 mLs by mouth every 12 (twelve) hours as needed.  140 mL  0  . ferrous sulfate 325 (65 FE) MG tablet Take 325 mg by mouth daily with breakfast.       . furosemide (LASIX) 40 MG tablet Take 1 tablet (40 mg total) by mouth daily.  30 tablet  1  . NITROSTAT 0.4 MG SL tablet as needed.      . potassium chloride SA (K-DUR,KLOR-CON) 20 MEQ tablet Take 20 mEq by mouth daily.      [name] warfarin (COUMADIN) 5 MG tablet Take 5 mg by mouth daily. This will be  managed by coumadin clinic      . [DISCONTINUED] allopurinol (ZYLOPRIM) 100 MG tablet take 2 tablets by mouth daily  60 tablet  0    Allergies  Allergen Reactions  . Adhesive (Tape)   . Latex Rash  . Neomycin-Bacitracin Zn-Polymyx Rash    Patient Active Problem List  Diagnosis  . HYPERLIPIDEMIA  . GOUT  . HYPERKALEMIA  . HYPERTENSION  . Atrial fibrillation  . VENOUS INSUFFICIENCY  . BACK PAIN, LUMBAR  . MEMORY LOSS  . COMBINED HEART FAILURE, ACUTE ON CHRONIC  . ALKALINE PHOSPHATASE, ELEVATED  . Aortic insufficiency and aortic stenosis  . Acute on chronic renal insufficiency  . Cardiac arrest  . Automatic implantable cardiac defibrillator single-chamber-Medtronic  . Alopecia  . Lower limb ischemia  . Acute on chronic systolic congestive heart failure, NYHA class 3  . Acute on chronic kidney disease, stage 3  . Anemia  . Atherosclerosis of native arteries of the extremities with intermittent claudication  . Hypokalemia  . Heart valve replaced by other means  . Annual physical exam    History  Smoking status  . Never Smoker   Smokeless tobacco  . Never Used    History  Alcohol Use No    Family History  Problem Relation Age of  Onset  . Hypertension Father   . Cancer Father   . Coronary artery disease Father 26    Review of Systems: Constitutional: no fever chills diaphoresis or fatigue or change in weight.  Head and neck: no hearing loss, no epistaxis, no photophobia or visual disturbance. Respiratory: No cough, shortness of breath or wheezing. Cardiovascular: No chest pain peripheral edema, palpitations. Gastrointestinal: No abdominal distention, no abdominal pain, no change in bowel habits hematochezia or melena. Genitourinary: No dysuria, no frequency, no urgency, no nocturia. Musculoskeletal:No arthralgias, no back pain, no gait disturbance or myalgias. Neurological: No dizziness, no headaches, no numbness, no seizures, no syncope, no weakness, no  tremors. Hematologic: No lymphadenopathy, no easy bruising. Psychiatric: No confusion, no hallucinations, no sleep disturbance.    Physical Exam: Filed Vitals:   08/03/12 0918  BP: 116/84  Pulse: 91  Resp: 18   the general appearance reveals a bright elderly woman in no distress.The head and neck exam reveals pupils equal and reactive.  Extraocular movements are full.  There is no scleral icterus.  The mouth and pharynx are normal.  The neck is supple.  The carotids reveal no bruits.  The jugular venous pressure is normal.  The  thyroid is not enlarged.  There is no lymphadenopathy.  The chest is clear to percussion and auscultation.  There are no rales or rhonchi.  Expansion of the chest is symmetrical.  The precordium is quiet.  The first heart sound is normal.  The second heart sound is physiologically split.  There is a soft systolic ejection murmur across her prosthetic aortic valve.  There is no diastolic murmur.  There is no abnormal lift or heave.  The abdomen is soft and nontender.  The bowel sounds are normal.  The liver and spleen are not enlarged.  There are no abdominal masses.  There are no abdominal bruits.  Extremities reveal good pedal pulses.  There is no phlebitis or edema.  There is no cyanosis or clubbing.  Strength is normal and symmetrical in all extremities.  There is no lateralizing weakness.  There are no sensory deficits.  The skin is warm and dry.  There is no rash.     Assessment / Plan: The patient is to continue same medication.  She is scheduled to get a echocardiogram soon.  She will return in 4 months for followup office visit EKG and fasting lab work and a CBC.  She has a past history of anemia

## 2012-08-03 NOTE — Patient Instructions (Addendum)
Your physician recommends that you continue on your current medications as directed. Please refer to the Current Medication list given to you today.  Your physician recommends that you schedule a follow-up appointment in: 4 months with fasting labs (lp/bmet/hfp/cbc) and ekg   

## 2012-08-03 NOTE — Assessment & Plan Note (Signed)
The patient has not had any TIA or stroke symptoms. 

## 2012-08-03 NOTE — Assessment & Plan Note (Signed)
her cardiac performance has improved significantly since her aortic valve replacement.  She jokes that she would now like to try going back to work.  She enjoys going out shopping with her family etc. she's not having any chest pain or significant exertional dyspnea.

## 2012-08-03 NOTE — Assessment & Plan Note (Signed)
The patient has a remote history of V. tach cardiac arrest.  She has a defibrillator.  He has not been aware of any shocks or arrhythmias

## 2012-08-04 ENCOUNTER — Other Ambulatory Visit (HOSPITAL_COMMUNITY): Payer: Medicare Other

## 2012-08-08 ENCOUNTER — Inpatient Hospital Stay (HOSPITAL_COMMUNITY)
Admission: EM | Admit: 2012-08-08 | Discharge: 2012-08-10 | DRG: 689 | Disposition: A | Discharge status: Home / Self Care | Payer: Medicare Other | Attending: Internal Medicine | Admitting: Internal Medicine

## 2012-08-08 ENCOUNTER — Emergency Department (HOSPITAL_COMMUNITY): Payer: Medicare Other

## 2012-08-08 DIAGNOSIS — M545 Low back pain: Secondary | ICD-10-CM

## 2012-08-08 DIAGNOSIS — Z954 Presence of other heart-valve replacement: Secondary | ICD-10-CM

## 2012-08-08 DIAGNOSIS — I469 Cardiac arrest, cause unspecified: Secondary | ICD-10-CM

## 2012-08-08 DIAGNOSIS — F411 Generalized anxiety disorder: Secondary | ICD-10-CM | POA: Diagnosis present

## 2012-08-08 DIAGNOSIS — R531 Weakness: Secondary | ICD-10-CM

## 2012-08-08 DIAGNOSIS — E785 Hyperlipidemia, unspecified: Secondary | ICD-10-CM

## 2012-08-08 DIAGNOSIS — Z95810 Presence of automatic (implantable) cardiac defibrillator: Secondary | ICD-10-CM

## 2012-08-08 DIAGNOSIS — Z8249 Family history of ischemic heart disease and other diseases of the circulatory system: Secondary | ICD-10-CM

## 2012-08-08 DIAGNOSIS — N10 Acute tubulo-interstitial nephritis: Secondary | ICD-10-CM

## 2012-08-08 DIAGNOSIS — I10 Essential (primary) hypertension: Secondary | ICD-10-CM | POA: Diagnosis present

## 2012-08-08 DIAGNOSIS — E876 Hypokalemia: Secondary | ICD-10-CM | POA: Diagnosis present

## 2012-08-08 DIAGNOSIS — M109 Gout, unspecified: Secondary | ICD-10-CM

## 2012-08-08 DIAGNOSIS — I509 Heart failure, unspecified: Secondary | ICD-10-CM | POA: Diagnosis present

## 2012-08-08 DIAGNOSIS — E669 Obesity, unspecified: Secondary | ICD-10-CM | POA: Diagnosis present

## 2012-08-08 DIAGNOSIS — Z7901 Long term (current) use of anticoagulants: Secondary | ICD-10-CM

## 2012-08-08 DIAGNOSIS — I2510 Atherosclerotic heart disease of native coronary artery without angina pectoris: Secondary | ICD-10-CM | POA: Diagnosis present

## 2012-08-08 DIAGNOSIS — N183 Chronic kidney disease, stage 3 unspecified: Secondary | ICD-10-CM | POA: Diagnosis present

## 2012-08-08 DIAGNOSIS — I5023 Acute on chronic systolic (congestive) heart failure: Secondary | ICD-10-CM

## 2012-08-08 DIAGNOSIS — I872 Venous insufficiency (chronic) (peripheral): Secondary | ICD-10-CM

## 2012-08-08 DIAGNOSIS — I129 Hypertensive chronic kidney disease with stage 1 through stage 4 chronic kidney disease, or unspecified chronic kidney disease: Secondary | ICD-10-CM | POA: Diagnosis present

## 2012-08-08 DIAGNOSIS — Z7982 Long term (current) use of aspirin: Secondary | ICD-10-CM

## 2012-08-08 DIAGNOSIS — I998 Other disorder of circulatory system: Secondary | ICD-10-CM

## 2012-08-08 DIAGNOSIS — R413 Other amnesia: Secondary | ICD-10-CM

## 2012-08-08 DIAGNOSIS — R509 Fever, unspecified: Secondary | ICD-10-CM

## 2012-08-08 DIAGNOSIS — D649 Anemia, unspecified: Secondary | ICD-10-CM

## 2012-08-08 DIAGNOSIS — E875 Hyperkalemia: Secondary | ICD-10-CM

## 2012-08-08 DIAGNOSIS — I4891 Unspecified atrial fibrillation: Secondary | ICD-10-CM

## 2012-08-08 DIAGNOSIS — I252 Old myocardial infarction: Secondary | ICD-10-CM

## 2012-08-08 DIAGNOSIS — Z0000 Encounter for general adult medical examination without abnormal findings: Secondary | ICD-10-CM

## 2012-08-08 DIAGNOSIS — Z8673 Personal history of transient ischemic attack (TIA), and cerebral infarction without residual deficits: Secondary | ICD-10-CM

## 2012-08-08 DIAGNOSIS — Z951 Presence of aortocoronary bypass graft: Secondary | ICD-10-CM

## 2012-08-08 DIAGNOSIS — I5043 Acute on chronic combined systolic (congestive) and diastolic (congestive) heart failure: Secondary | ICD-10-CM | POA: Diagnosis present

## 2012-08-08 DIAGNOSIS — R748 Abnormal levels of other serum enzymes: Secondary | ICD-10-CM

## 2012-08-08 DIAGNOSIS — Z79899 Other long term (current) drug therapy: Secondary | ICD-10-CM

## 2012-08-08 DIAGNOSIS — N179 Acute kidney failure, unspecified: Secondary | ICD-10-CM | POA: Diagnosis present

## 2012-08-08 DIAGNOSIS — I352 Nonrheumatic aortic (valve) stenosis with insufficiency: Secondary | ICD-10-CM

## 2012-08-08 DIAGNOSIS — I70219 Atherosclerosis of native arteries of extremities with intermittent claudication, unspecified extremity: Secondary | ICD-10-CM

## 2012-08-08 DIAGNOSIS — N390 Urinary tract infection, site not specified: Secondary | ICD-10-CM

## 2012-08-08 DIAGNOSIS — N189 Chronic kidney disease, unspecified: Secondary | ICD-10-CM

## 2012-08-08 LAB — URINALYSIS, ROUTINE W REFLEX MICROSCOPIC
Bilirubin Urine: NEGATIVE
Glucose, UA: NEGATIVE mg/dL
Ketones, ur: NEGATIVE mg/dL
Protein, ur: 30 mg/dL — AB
Urobilinogen, UA: 1 mg/dL (ref 0.0–1.0)

## 2012-08-08 LAB — CBC WITH DIFFERENTIAL/PLATELET
Eosinophils Absolute: 0.2 10*3/uL (ref 0.0–0.7)
Eosinophils Relative: 2 % (ref 0–5)
HCT: 36.3 % (ref 36.0–46.0)
Lymphocytes Relative: 13 % (ref 12–46)
Lymphs Abs: 1.3 10*3/uL (ref 0.7–4.0)
MCH: 29.5 pg (ref 26.0–34.0)
MCV: 88.5 fL (ref 78.0–100.0)
Monocytes Absolute: 1.8 10*3/uL — ABNORMAL HIGH (ref 0.1–1.0)
Platelets: 166 10*3/uL (ref 150–400)
RBC: 4.1 MIL/uL (ref 3.87–5.11)
RDW: 16 % — ABNORMAL HIGH (ref 11.5–15.5)
WBC: 10.1 10*3/uL (ref 4.0–10.5)

## 2012-08-08 LAB — URINE MICROSCOPIC-ADD ON

## 2012-08-08 LAB — PROTIME-INR: INR: 1.77 — ABNORMAL HIGH (ref 0.00–1.49)

## 2012-08-08 LAB — BASIC METABOLIC PANEL
CO2: 26 mEq/L (ref 19–32)
Calcium: 8.6 mg/dL (ref 8.4–10.5)
Creatinine, Ser: 1.72 mg/dL — ABNORMAL HIGH (ref 0.50–1.10)
GFR calc non Af Amer: 29 mL/min — ABNORMAL LOW (ref >90)
Glucose, Bld: 123 mg/dL — ABNORMAL HIGH (ref 70–99)

## 2012-08-08 LAB — TROPONIN I: Troponin I: <0.3 ng/mL (ref <0.30)

## 2012-08-08 MED ORDER — HYDROMORPHONE HCL PF 1 MG/ML IJ SOLN: 0.5000 mg | INTRAMUSCULAR | Status: DC | PRN

## 2012-08-08 MED ORDER — SODIUM CHLORIDE 0.9 % IV SOLN: INTRAVENOUS | Status: DC

## 2012-08-08 MED ORDER — ZOLPIDEM TARTRATE 5 MG PO TABS: 5.0000 mg | ORAL_TABLET | Freq: Every evening | ORAL | Status: DC | PRN

## 2012-08-08 MED ORDER — ALUM & MAG HYDROXIDE-SIMETH 200-200-20 MG/5ML PO SUSP: 30.0000 mL | Freq: Four times a day (QID) | ORAL | Status: DC | PRN

## 2012-08-08 MED ORDER — ACETAMINOPHEN 325 MG PO TABS
650.0000 mg | ORAL_TABLET | Freq: Four times a day (QID) | ORAL | Status: DC | PRN
  Administered 2012-08-09: 650 mg via ORAL
  Filled 2012-08-08: qty 2

## 2012-08-08 MED ORDER — OXYCODONE HCL 5 MG PO TABS
5.0000 mg | ORAL_TABLET | ORAL | Status: DC | PRN
  Administered 2012-08-10: 5 mg via ORAL
  Filled 2012-08-08: qty 1

## 2012-08-08 MED ORDER — ONDANSETRON HCL 4 MG PO TABS: 4.0000 mg | ORAL_TABLET | Freq: Four times a day (QID) | ORAL | Status: DC | PRN

## 2012-08-08 MED ORDER — ONDANSETRON HCL 4 MG/2ML IJ SOLN: 4.0000 mg | Freq: Four times a day (QID) | INTRAMUSCULAR | Status: DC | PRN

## 2012-08-08 MED ORDER — ACETAMINOPHEN 325 MG PO TABS
650.0000 mg | ORAL_TABLET | Freq: Once | ORAL | Status: AC
  Administered 2012-08-08: 650 mg via ORAL
  Filled 2012-08-08: qty 2

## 2012-08-08 MED ORDER — ACETAMINOPHEN 650 MG RE SUPP: 650.0000 mg | Freq: Four times a day (QID) | RECTAL | Status: DC | PRN

## 2012-08-08 MED ORDER — DEXTROSE 5 % IV SOLN
1.0000 g | Freq: Once | INTRAVENOUS | Status: AC
  Administered 2012-08-08: 1 g via INTRAVENOUS
  Filled 2012-08-08: qty 10

## 2012-08-08 MED ORDER — SODIUM CHLORIDE 0.9 % IV SOLN
INTRAVENOUS | Status: DC
  Administered 2012-08-08: 22:00:00 via INTRAVENOUS

## 2012-08-08 MED ORDER — CEFTRIAXONE SODIUM 1 G IJ SOLR
1.0000 g | INTRAMUSCULAR | Status: DC
  Administered 2012-08-09: 1 g via INTRAVENOUS
  Filled 2012-08-08 (×2): qty 10

## 2012-08-08 MED ORDER — ENOXAPARIN SODIUM 40 MG/0.4ML ~~LOC~~ SOLN: 40.0000 mg | SUBCUTANEOUS | Status: DC

## 2012-08-08 NOTE — ED Notes (Signed)
EKG shown to the MD, Bernette Mayers

## 2012-08-08 NOTE — ED Notes (Signed)
Pt presented to ED with back pain but painfree at present.

## 2012-08-08 NOTE — ED Notes (Signed)
Pt transported to ED with back pain and feeling unwell.Pacemaker in situ.

## 2012-08-09 ENCOUNTER — Encounter (HOSPITAL_COMMUNITY): Payer: Self-pay | Admitting: Internal Medicine

## 2012-08-09 DIAGNOSIS — I4891 Unspecified atrial fibrillation: Secondary | ICD-10-CM

## 2012-08-09 DIAGNOSIS — I5043 Acute on chronic combined systolic (congestive) and diastolic (congestive) heart failure: Secondary | ICD-10-CM

## 2012-08-09 DIAGNOSIS — Z95810 Presence of automatic (implantable) cardiac defibrillator: Secondary | ICD-10-CM

## 2012-08-09 DIAGNOSIS — N10 Acute tubulo-interstitial nephritis: Principal | ICD-10-CM

## 2012-08-09 LAB — CBC
Hemoglobin: 11.3 g/dL — ABNORMAL LOW (ref 12.0–15.0)
Platelets: 151 10*3/uL (ref 150–400)
RBC: 3.87 MIL/uL (ref 3.87–5.11)

## 2012-08-09 LAB — BASIC METABOLIC PANEL
GFR calc Af Amer: 40 mL/min — ABNORMAL LOW (ref >90)
GFR calc non Af Amer: 34 mL/min — ABNORMAL LOW (ref >90)
Potassium: 3.3 mEq/L — ABNORMAL LOW (ref 3.5–5.1)
Sodium: 141 mEq/L (ref 135–145)

## 2012-08-09 LAB — PROTIME-INR
INR: 1.79 — ABNORMAL HIGH (ref 0.00–1.49)
Prothrombin Time: 20.2 seconds — ABNORMAL HIGH (ref 11.6–15.2)

## 2012-08-09 MED ORDER — WARFARIN SODIUM 5 MG PO TABS
5.0000 mg | ORAL_TABLET | ORAL | Status: AC
  Administered 2012-08-09: 5 mg via ORAL
  Filled 2012-08-09 (×2): qty 1

## 2012-08-09 MED ORDER — CARVEDILOL 6.25 MG PO TABS
6.2500 mg | ORAL_TABLET | Freq: Two times a day (BID) | ORAL | Status: DC
  Administered 2012-08-09 – 2012-08-10 (×2): 6.25 mg via ORAL
  Filled 2012-08-09 (×4): qty 1

## 2012-08-09 MED ORDER — FUROSEMIDE 40 MG PO TABS
40.0000 mg | ORAL_TABLET | Freq: Every day | ORAL | Status: DC
  Administered 2012-08-09 – 2012-08-10 (×2): 40 mg via ORAL
  Filled 2012-08-09 (×2): qty 1

## 2012-08-09 MED ORDER — WARFARIN - PHARMACIST DOSING INPATIENT: Freq: Every day | Status: DC

## 2012-08-09 MED ORDER — WARFARIN SODIUM 5 MG PO TABS
5.0000 mg | ORAL_TABLET | Freq: Every day | ORAL | Status: DC
  Filled 2012-08-09: qty 1

## 2012-08-09 MED ORDER — POTASSIUM CHLORIDE CRYS ER 20 MEQ PO TBCR
40.0000 meq | EXTENDED_RELEASE_TABLET | Freq: Once | ORAL | Status: AC
  Administered 2012-08-09: 40 meq via ORAL
  Filled 2012-08-09: qty 2

## 2012-08-09 NOTE — Progress Notes (Signed)
PT Cancellation Note  Patient Details Name: [name] MRN: [id number] DOB: 01/17/1941   Cancelled Treatment:   Pt reports she has had a bad day and will get up tomorrow. Pt known to me from previous admission.   [name] 08/09/2012, 2:09 PM

## 2012-08-09 NOTE — H&P (Signed)
[facility] History and Physical  [name] XLK:[id number] DOB: 1941-07-03 DOA: 08/08/2012  Referring physician: EDP PCP: [name], [name], MD  Specialists:   Chief Complaint: Weakness  HPI: [name] is a 71 y.o. female with Multiple Medical problems who presents to the ED with complaints of progressive weakness over the past week, and has been unable to walk today due to weakness.  She reports having back pain and fever and chills over there past 24 hours.  She denies and nausea or vomiting or diarrhea.  In the ED, she was found to have a Positive UA , and was administered IV rocephin and referred for admission.      Review of Systems: The patient denies anorexia, fever, weight loss, vision loss, decreased hearing, hoarseness, chest pain, syncope, dyspnea on exertion, peripheral edema, balance deficits, hemoptysis, melena, hematochezia, severe indigestion/heartburn, hematuria, incontinence, genital sores, suspicious skin lesions, transient blindness, difficulty walking, depression, unusual weight change, abnormal bleeding, enlarged lymph nodes, angioedema, and breast masses.    Past Medical History  Diagnosis Date  . History of ventricular tachycardia   . Peripheral edema   . Aortic insufficiency     Moderate 2012  . History of gout   . Exogenous obesity   . History of CHF (congestive heart failure)     EF 45% 2012, nonischemic (Normal coronaries 2002)  . 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 Hypertension   . 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 ICD (implantable cardiac defibrillator) in place 2012  . DOE (dyspnea on exertion)   . Stroke 2000  . Renal insufficiency   . Myocardial infarction 2000  . Anxiety   . 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 Anemia   . Presence of IVC filter 2000   Past Surgical History  Procedure Date  . Cardiac  defibrillator placement   . Cataract extraction   . Embolectomy 02/15/2012    Procedure: EMBOLECTOMY;  Surgeon: [name], MD;  Location: [facility] OR;  Service: Vascular;  Laterality: Left;  left popliteal embolectomy with drain placement  . Insert / replace / remove pacemaker   . Eye surgery 2012    removal of cataracts bilaterally  . Cardiac catheterization   . Tonsillectomy   . Aortic valve replacement 04/12/2012    Procedure: AORTIC VALVE REPLACEMENT (AVR);  Surgeon: Delight Ovens, MD;  Location: [facility] OR;  Service: Open Heart Surgery;  Laterality: N/A;  with nitric oxide  . Thoracic aortic aneurysm repair 04/12/2012    Procedure: THORACIC ASCENDING ANEURYSM REPAIR (AAA);  Surgeon: Delight Ovens, MD;  Location: [facility] OR;  Service: Open Heart Surgery;  Laterality: N/A;  . Clot removal 5.2013    blood clot removed from the back of the left leg  . Aortic valve surgery 7.2013    Medications:  HOME MEDS: Prior to Admission medications   Medication Sig Start Date End Date Taking? Authorizing Provider  allopurinol (ZYLOPRIM) 100 MG tablet Take 100 mg by mouth 2 (two) times daily.   Yes Historical Provider, MD  aspirin 325 MG EC tablet Take 325 mg by mouth every 6 (six) hours as needed. For pain   Yes Historical Provider, MD  aspirin EC 81 MG EC tablet Take 1 tablet (81 mg total) by mouth daily. 04/22/12 04/22/13 Yes Rowe Clack, PA  Biotin 5000 MCG CAPS Take 1 capsule by mouth  daily.   Yes Historical Provider, MD  carvedilol (COREG) 6.25 MG tablet Take 1 tablet (6.25 mg total) by mouth 2 (two) times daily with a meal. 04/28/12  Yes [name], MD  furosemide (LASIX) 40 MG tablet Take 1 tablet (40 mg total) by mouth daily. 04/22/12 04/22/13 Yes Wayne E Gold, PA  NITROSTAT 0.4 MG SL tablet Place 0.4 mg under the tongue every 5 (five) minutes as needed. For chest pain 02/29/12  Yes Historical Provider, MD  oxyCODONE (OXY IR/ROXICODONE) 5 MG immediate release tablet Take 5-10 mg by mouth  every 4 (four) hours as needed. For pain   Yes Historical Provider, MD  potassium chloride SA (K-DUR,KLOR-CON) 20 MEQ tablet Take 20 mEq by mouth daily.   Yes Historical Provider, MD  warfarin (COUMADIN) 5 MG tablet Take 5 mg by mouth every evening.  04/22/12 04/22/13 Yes Rowe Clack, PA     Allergies  Allergen Reactions  . Adhesive (Tape) Other (See Comments)    Redness and skin breaks down  . Latex Rash  . Neomycin-Bacitracin Zn-Polymyx Rash    Social History:  reports that she has never smoked. She has never used smokeless tobacco. She reports that she does not drink alcohol or use illicit drugs.   Family History  Problem Relation Age of Onset  . Hypertension Father   . Cancer Father   . Coronary artery disease Father 56      Physical Exam:  GEN:  Pleasant elderly 71 year old Obese Caucasian Female examined and in no acute distress; cooperative with exam Filed Vitals:   08/08/12 2115 08/08/12 2148 08/08/12 2200 08/08/12 2300  BP: 140/72 139/82 124/79 126/77  Pulse: 94  88 81  Temp:      TempSrc:      Resp:  21 20 21   SpO2: 95% 95% 95% 95%   Blood pressure 126/77, pulse 81, temperature 101 F (38.3 C), temperature source Rectal, resp. rate 21, SpO2 95.00%. PSYCH: She is alert and oriented x4; does not appear anxious does not appear depressed; affect is normal HEENT: Normocephalic and Atraumatic, Mucous membranes pink; PERRLA; EOM intact; Fundi:  Benign;  No scleral icterus, Nares: Patent, Oropharynx: Clear, Edentulous with Dentures present, Neck:  FROM, no cervical lymphadenopathy nor thyromegaly or carotid bruit; no JVD; Breasts:: Not examined CHEST WALL: No tenderness CHEST: Normal respiration, clear to auscultation bilaterally HEART: Regular rate and rhythm; no murmurs rubs or gallops BACK: No kyphosis or scoliosis; no CVA tenderness ABDOMEN: Positive Bowel Sounds, Obese, soft non-tender; no masses, no organomegaly.   Rectal Exam: Not done EXTREMITIES: No cyanosis,  clubbing or edema; no ulcerations. Genitalia: not examined PULSES: 2+ and symmetric   Labs on Admission:  Basic Metabolic Panel:  Lab 08/08/12 4098  NA 139  K 3.3*  CL 101  CO2 26  GLUCOSE 123*  BUN 42*  CREATININE 1.72*  CALCIUM 8.6  MG --  PHOS --   Liver Function Tests: No results found for this basename: AST:5,ALT:5,ALKPHOS:5,BILITOT:5,PROT:5,ALBUMIN:5 in the last 168 hours No results found for this basename: LIPASE:5,AMYLASE:5 in the last 168 hours No results found for this basename: AMMONIA:5 in the last 168 hours CBC:  Lab 08/08/12 2107  WBC 10.1  NEUTROABS 6.8  HGB 12.1  HCT 36.3  MCV 88.5  PLT 166   Cardiac Enzymes:  Lab 08/08/12 2108  CKTOTAL --  CKMB --  CKMBINDEX --  TROPONINI <0.30    BNP (last 3 results)  Basename 08/08/12 2108 04/07/12 0510  PROBNP  2404.0* 2714.0*   CBG: No results found for this basename: GLUCAP:5 in the last 168 hours  Radiological Exams on Admission: Dg Chest 2 View  08/08/2012  *RADIOLOGY REPORT*  Clinical Data: Fever, weakness, shortness of breath.  CHEST - 2 VIEW  Comparison: 05/13/2012  Findings: Left AICD remains in place, unchanged.  Prior median sternotomy and valve replacement.  Cardiomegaly.  No focal opacities, effusions or edema.  No acute bony abnormality.  IMPRESSION: Cardiomegaly.  No active disease.   Original Report Authenticated By: Charlett Nose, M.D.      Assessment/Plan Principal Problem:  *Acute pyelonephritis Active Problems:  Fever  Weakness generalized  HYPERTENSION  Atrial fibrillation  COMBINED HEART FAILURE, ACUTE ON CHRONIC  Acute on chronic kidney disease, stage 3  Hypokalemia    Plan:   Admit to Med/Surg Urine for C+S sent, Placed on IV Rocephin Gentle IVFs Reconcile Home Medication Monitor Electrolytes and Replete PRN Pharmacy adjustment of Coumadin therapy   Code Status: FULL CODE Family Communication: N/A Disposition Plan: Return to Home  Time spent: 37  Minutes   Ron Parker [facility] Pager [phone] If 7PM-7AM, please contact night-coverage [email or web address] Password [email or web address] 08/09/2012, 12:04 AM

## 2012-08-09 NOTE — Progress Notes (Signed)
[facility] PROGRESS NOTE  [name]:[id number] DOB: 1941/01/25 DOA: 08/08/2012 PCP: Estill Cotta, MD  Assessment/Plan:  Acute Pyelonephritis Culture pending On Rocephin Creatinine at baseline Was febrile on admit, currently non toxic looking and with no leukocytosis  H/O CHF, Afib, CABG with AVR this past summer. Stable and compensated Check weights Patient with chronic lower extremity swelling On Coumadin for Afib-pharmacy dosing Will resume beta blocker and diuretic therapy.  Hypokalemia Replete and recheck bmet  Weakness - unable to walk PT eval ordered.  Code Status: full code Family Communication:  Disposition Plan: to home when appropriate.    Antibiotics:  Rocephin 08/08/12  HPI/Subjective: Central lower back pain improved.  Objective: Filed Vitals:   08/08/12 2300 08/09/12 0000 08/09/12 0121 08/09/12 0519  BP: 126/77 120/63 144/83 135/85  Pulse: 81 78 67 86  Temp:   97.7 F (36.5 C) 97.9 F (36.6 C)  TempSrc:   Oral Oral  Resp: 21 17 17 17   Height:   5' 2.4" (1.585 m)   Weight:   79.7 kg (175 lb 11.3 oz)   SpO2: 95% 94% 94% 92%   No intake or output data in the 24 hours ending 08/09/12 0845 Filed Weights   08/09/12 0121  Weight: 79.7 kg (175 lb 11.3 oz)    Exam:   General:  A&O, NAD, nontoxic  Cardiovascular: RRR no obvious M/R/G  Respiratory: CTA no W/C/R  Abdomen: Soft, NT, ND, +BS, No masses  Extremities:  Bilateral Lower extremity swelling 1+  Data Reviewed: Basic Metabolic Panel:  Lab 08/09/12 8119 08/08/12 2107  NA 141 139  K 3.3* 3.3*  CL 104 101  CO2 27 26  GLUCOSE 85 123*  BUN 35* 42*  CREATININE 1.50* 1.72*  CALCIUM 8.6 8.6  MG 2.3 --  PHOS -- --   CBC:  Lab 08/09/12 0645 08/08/12 2107  WBC 7.8 10.1  NEUTROABS -- 6.8  HGB 11.3* 12.1  HCT 34.5* 36.3  MCV 89.1 88.5  PLT 151 166   Cardiac Enzymes:  Lab 08/08/12 2108  CKTOTAL --  CKMB --  CKMBINDEX --  TROPONINI <0.30    BNP (last 3 results)  Basename 08/08/12 2108 04/07/12 0510  PROBNP 2404.0* 2714.0*     Studies: Dg Chest 2 View  08/08/2012  *RADIOLOGY REPORT*  Clinical Data: Fever, weakness, shortness of breath.  CHEST - 2 VIEW  Comparison: 05/13/2012  Findings: Left AICD remains in place, unchanged.  Prior median sternotomy and valve replacement.  Cardiomegaly.  No focal opacities, effusions or edema.  No acute bony abnormality.  IMPRESSION: Cardiomegaly.  No active disease.   Original Report Authenticated By: Charlett Nose, M.D.     Scheduled Meds:   . [COMPLETED] acetaminophen  650 mg Oral Once  . [COMPLETED] cefTRIAXone (ROCEPHIN)  IV  1 g Intravenous Once  . cefTRIAXone (ROCEPHIN)  IV  1 g Intravenous Q24H  . potassium chloride  40 mEq Oral Once  . [COMPLETED] warfarin  5 mg Oral NOW  . warfarin  5 mg Oral q1800  . Warfarin - Pharmacist Dosing Inpatient   Does not apply q1800  . [DISCONTINUED] enoxaparin (LOVENOX) injection  40 mg Subcutaneous Q24H   Continuous Infusions:   . [DISCONTINUED] sodium chloride 125 mL/hr at 08/08/12 2215  . [DISCONTINUED] sodium chloride 75 mL/hr at 08/09/12 0140    Principal Problem:  *Acute pyelonephritis Active Problems:  HYPERTENSION  Atrial fibrillation  COMBINED HEART FAILURE, ACUTE ON CHRONIC  Acute on chronic kidney disease,  stage 3  Hypokalemia  Fever  Weakness generalized    Time spent: 20 min.    [name]  [facility] Pager [phone]. If 8PM-8AM, please contact night-coverage at [email or web address], password [email or web address] 08/09/2012, 8:45 AM  LOS: 1 day    Attending Patient seen and examined, admitted with Fever, weakness and Back pain, was febrile in the ED. Currently feels much more better. Presumably she has Pyelonephritis-c/w Rocephin, stop all IVF. Resume home meds. She is on coumadin for Afib, INR slightly subtherapeutic, she recently has had a CABG with AVR for severe AS.   [name]

## 2012-08-09 NOTE — Progress Notes (Signed)
ANTICOAGULATION CONSULT NOTE - Initial Consult  Pharmacy Consult for coumadin  Indication: atrial fibrillation  Allergies  Allergen Reactions  . Adhesive (Tape) Other (See Comments)    Redness and skin breaks down  . Latex Rash  . Neomycin-Bacitracin Zn-Polymyx Rash    Patient Measurements:   Heparin Dosing Weight:   Vital Signs: Temp: 101 F (38.3 C) (11/10 2110) Temp src: Rectal (11/10 2110) BP: 126/77 mmHg (11/10 2300) Pulse Rate: 81  (11/10 2300)  Labs:  [facility] 08/08/12 2108 08/08/12 2107  HGB -- 12.1  HCT -- 36.3  PLT -- 166  APTT -- 35  LABPROT -- 20.0*  INR -- 1.77*  HEPARINUNFRC -- --  CREATININE -- 1.72*  CKTOTAL -- --  CKMB -- --  TROPONINI <0.30 --    The CrCl is unknown because both a height and weight (above a minimum accepted value) are required for this calculation.   Medical History: Past Medical History  Diagnosis Date  . History of ventricular tachycardia   . Peripheral edema   . Aortic insufficiency     Moderate 2012  . History of gout   . Exogenous obesity   . History of CHF (congestive heart failure)     EF 45% 2012, nonischemic (Normal coronaries 2002)  . 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 Hypertension   . 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 ICD (implantable cardiac defibrillator) in place 2012  . DOE (dyspnea on exertion)   . Stroke 2000  . Renal insufficiency   . Myocardial infarction 2000  . Anxiety   . 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 Anemia   . Presence of IVC filter 2000    Medications:   (Not in a hospital admission)  Assessment: Coumadin for afib. INR 1.77 which is slightly below goal.  Goal of Therapy:  INR 2-3 Monitor platelets by anticoagulation protocol: Yes   Plan:  Continue home dose of coumadin 5mg  starting now as last dose was 11/9  and check inr daily.    Janice Coffin 08/09/2012,12:40 AM

## 2012-08-09 NOTE — ED Provider Notes (Addendum)
History     CSN: [id number]  Arrival date & time 08/08/12  2048   First MD Initiated Contact with Patient 08/08/12 2050      Chief Complaint  Patient presents with  . Shortness of Breath  . Weakness    (Consider location/radiation/quality/duration/timing/severity/associated sxs/prior treatment) HPI Pt with multiple medical problems reports increasing weakness over the last several days to the point of being unable to stand today. She denies fever, but has had some back pain. No CP, SOB. No dysuria or hematuria.   Past Medical History  Diagnosis Date  . History of ventricular tachycardia   . Peripheral edema   . Aortic insufficiency     Moderate 2012  . History of gout   . Exogenous obesity   . History of CHF (congestive heart failure)     EF 45% 2012, nonischemic (Normal coronaries 2002)  . 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 Hypertension   . 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 ICD (implantable cardiac defibrillator) in place 2012  . DOE (dyspnea on exertion)   . Stroke 2000  . Renal insufficiency   . Myocardial infarction 2000  . Anxiety   . 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 Anemia   . Presence of IVC filter 2000    Past Surgical History  Procedure Date  . Cardiac defibrillator placement   . Cataract extraction   . Embolectomy 02/15/2012    Procedure: EMBOLECTOMY;  Surgeon: [name], MD;  Location: [facility] OR;  Service: Vascular;  Laterality: Left;  left popliteal embolectomy with drain placement  . Insert / replace / remove pacemaker   . Eye surgery 2012    removal of cataracts bilaterally  . Cardiac catheterization   . Tonsillectomy   . Aortic valve replacement 04/12/2012    Procedure: AORTIC VALVE REPLACEMENT (AVR);  Surgeon: Delight Ovens, MD;  Location: [facility] OR;  Service: Open Heart Surgery;  Laterality: N/A;  with nitric oxide  .  Thoracic aortic aneurysm repair 04/12/2012    Procedure: THORACIC ASCENDING ANEURYSM REPAIR (AAA);  Surgeon: Delight Ovens, MD;  Location: [facility] OR;  Service: Open Heart Surgery;  Laterality: N/A;  . Clot removal 5.2013    blood clot removed from the back of the left leg  . Aortic valve surgery 7.2013    Family History  Problem Relation Age of Onset  . Hypertension Father   . Cancer Father   . Coronary artery disease Father 36    History  Substance Use Topics  . Smoking status: Never Smoker   . Smokeless tobacco: Never Used  . Alcohol Use: No    OB History    Grav Para Term Preterm Abortions TAB SAB Ect Mult Living                  Review of Systems All other systems reviewed and are negative except as noted in HPI.   Allergies  Adhesive; Latex; and Neomycin-bacitracin zn-polymyx  Home Medications   Current Outpatient Rx  Name  Route  Sig  Dispense  Refill  . ALLOPURINOL 100 MG PO TABS   Oral   Take 100 mg by mouth 2 (two) times daily.         . ASPIRIN 325 MG PO TBEC   Oral   Take 325 mg by mouth every 6 (six) hours as needed. For pain         .  ASPIRIN 81 MG PO TBEC   Oral   Take 1 tablet (81 mg total) by mouth daily.         [name] BIOTIN 5000 MCG PO CAPS   Oral   Take 1 capsule by mouth daily.         [name] CARVEDILOL 6.25 MG PO TABS   Oral   Take 1 tablet (6.25 mg total) by mouth 2 (two) times daily with a meal.   60 tablet   5   . FUROSEMIDE 40 MG PO TABS   Oral   Take 1 tablet (40 mg total) by mouth daily.   30 tablet   1   . NITROSTAT 0.4 MG SL SUBL   Sublingual   Place 0.4 mg under the tongue every 5 (five) minutes as needed. For chest pain         . OXYCODONE HCL 5 MG PO TABS   Oral   Take 5-10 mg by mouth every 4 (four) hours as needed. For pain         . POTASSIUM CHLORIDE CRYS ER 20 MEQ PO TBCR   Oral   Take 20 mEq by mouth daily.         . WARFARIN SODIUM 5 MG PO TABS   Oral   Take 5 mg by mouth every evening.              BP 126/77  Pulse 81  Temp 101 F (38.3 C) (Rectal)  Resp 21  SpO2 95%  Physical Exam  Nursing note and vitals reviewed. Constitutional: She is oriented to person, place, and time. She appears well-developed and well-nourished.  HENT:  Head: Normocephalic and atraumatic.  Eyes: EOM are normal. Pupils are equal, round, and reactive to light.  Neck: Normal range of motion. Neck supple.  Cardiovascular: Normal rate, normal heart sounds and intact distal pulses.   Pulmonary/Chest: Effort normal and breath sounds normal.  Abdominal: Bowel sounds are normal. She exhibits no distension. There is no tenderness.  Musculoskeletal: Normal range of motion. She exhibits no edema and no tenderness.  Neurological: She is alert and oriented to person, place, and time. She has normal strength. No cranial nerve deficit or sensory deficit.  Skin: Skin is warm and dry. No rash noted.  Psychiatric: She has a normal mood and affect.    ED Course  Procedures (including critical care time)  Labs Reviewed  CBC WITH DIFFERENTIAL - Abnormal; Notable for the following:    RDW 16.0 (*)     Monocytes Relative 18 (*)     Monocytes Absolute 1.8 (*)     All other components within normal limits  BASIC METABOLIC PANEL - Abnormal; Notable for the following:    Potassium 3.3 (*)     Glucose, Bld 123 (*)     BUN 42 (*)     Creatinine, Ser 1.72 (*)     GFR calc non Af Amer 29 (*)     GFR calc Af Amer 34 (*)     All other components within normal limits  URINALYSIS, ROUTINE W REFLEX MICROSCOPIC - Abnormal; Notable for the following:    APPearance CLOUDY (*)     Hgb urine dipstick SMALL (*)     Protein, ur 30 (*)     Leukocytes, UA MODERATE (*)     All other components within normal limits  PRO B NATRIURETIC PEPTIDE - Abnormal; Notable for the following:    Pro B Natriuretic peptide (BNP) 2404.0 (*)  All other components within normal limits  PROTIME-INR - Abnormal; Notable for the following:     Prothrombin Time 20.0 (*)     INR 1.77 (*)     All other components within normal limits  URINE MICROSCOPIC-ADD ON - Abnormal; Notable for the following:    Bacteria, UA MANY (*)     Casts HYALINE CASTS (*)     All other components within normal limits  TROPONIN I  APTT  URINE CULTURE  BASIC METABOLIC PANEL  CBC   Dg Chest 2 View  08/08/2012  *RADIOLOGY REPORT*  Clinical Data: Fever, weakness, shortness of breath.  CHEST - 2 VIEW  Comparison: 05/13/2012  Findings: Left AICD remains in place, unchanged.  Prior median sternotomy and valve replacement.  Cardiomegaly.  No focal opacities, effusions or edema.  No acute bony abnormality.  IMPRESSION: Cardiomegaly.  No active disease.   Original Report Authenticated By: Charlett Nose, M.D.      1. UTI (lower urinary tract infection)   2. General weakness       MDM   Date: 08/09/2012  Rate: 93  Rhythm: atrial fibrillation  QRS Axis: normal  Intervals: normal  ST/T Wave abnormalities: nonspecific ST/T changes  Conduction Disutrbances:nonspecific intraventricular conduction delay  Narrative Interpretation:   Old EKG Reviewed: unchanged   Admit for UTI and weakness. Spoke with Hospitalist who will see in the ED.        Lizzette Carbonell B. Bernette Mayers, MD 08/09/12 0010  Bonnita Levan. Bernette Mayers, MD 08/17/12 Windell Moment

## 2012-08-10 DIAGNOSIS — R5381 Other malaise: Secondary | ICD-10-CM

## 2012-08-10 DIAGNOSIS — I10 Essential (primary) hypertension: Secondary | ICD-10-CM

## 2012-08-10 LAB — BASIC METABOLIC PANEL
BUN: 29 mg/dL — ABNORMAL HIGH (ref 6–23)
CO2: 25 mEq/L (ref 19–32)
GFR calc non Af Amer: 39 mL/min — ABNORMAL LOW (ref >90)
Glucose, Bld: 83 mg/dL (ref 70–99)
Potassium: 3.5 mEq/L (ref 3.5–5.1)

## 2012-08-10 LAB — URINE CULTURE: Colony Count: 60000

## 2012-08-10 MED ORDER — CIPROFLOXACIN HCL 500 MG PO TABS: 500.0000 mg | ORAL_TABLET | Freq: Two times a day (BID) | ORAL | Status: DC

## 2012-08-10 MED ORDER — BISACODYL 5 MG PO TBEC
5.0000 mg | DELAYED_RELEASE_TABLET | Freq: Once | ORAL | Status: AC
  Administered 2012-08-10: 5 mg via ORAL
  Filled 2012-08-10: qty 1

## 2012-08-10 NOTE — Progress Notes (Signed)
Pt given discharge instructions and follow up appointment information. Patient is Alert and oriented with no change in status during the morning. She is planning to go home with her daughter in stable condition.

## 2012-08-10 NOTE — Progress Notes (Signed)
PT Cancellation Note  Patient Details Name: [name] MRN: [id number] DOB: 29-Dec-1940   Cancelled Treatment:    Reason Eval/Treat Not Completed: Other (comment) (Pt now independent in room and being dc'd home.)   [facility] 08/10/2012, 11:03 AM

## 2012-08-10 NOTE — Discharge Summary (Signed)
PATIENT DETAILS Name: [name] Age: 71 y.o. Sex: female Date of Birth: [birth year] MRN: [id number]. Admit Date: 08/08/2012 Admitting Physician: Ron Parker, MD JYN:[id number] Karene Fry, MD  Recommendations for Outpatient Follow-up:  1. Close PT/INR follow up-as INR subtherapeutic  PRIMARY DISCHARGE DIAGNOSIS:  Principal Problem:  *Acute pyelonephritis Active Problems:  HYPERTENSION  Atrial fibrillation  COMBINED HEART FAILURE, ACUTE ON CHRONIC  Acute on chronic kidney disease, stage 3  Hypokalemia  Fever  Weakness generalized      PAST MEDICAL HISTORY: Past Medical History  Diagnosis Date  . History of ventricular tachycardia   . Peripheral edema   . Aortic insufficiency     Moderate 2012  . History of gout   . Exogenous obesity   . History of CHF (congestive heart failure)     EF 45% 2012, nonischemic (Normal coronaries 2002)  . 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 Hypertension   . 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 ICD (implantable cardiac defibrillator) in place 2012  . DOE (dyspnea on exertion)   . Stroke 2000  . Renal insufficiency   . Myocardial infarction 2000  . Anxiety   . 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 Anemia   . Presence of IVC filter 2000    DISCHARGE MEDICATIONS:   Medication List     As of 08/10/2012 10:00 AM    TAKE these medications         allopurinol 100 MG tablet   Commonly known as: ZYLOPRIM   Take 100 mg by mouth 2 (two) times daily.      aspirin 81 MG EC tablet   Take 1 tablet (81 mg total) by mouth daily.      aspirin 325 MG EC tablet   Take 325 mg by mouth every 6 (six) hours as needed. For pain      Biotin 5000 MCG Caps   Take 1 capsule by mouth daily.      carvedilol 6.25 MG tablet   Commonly known as: COREG   Take 1 tablet (6.25 mg total) by mouth 2 (two) times daily with a meal.      ciprofloxacin 500 MG tablet   Commonly known as: CIPRO   Take 1 tablet (500 mg total) by mouth 2 (two) times daily.      furosemide 40 MG tablet   Commonly known as: LASIX   Take 1 tablet (40 mg total) by mouth daily.      NITROSTAT 0.4 MG SL tablet   Generic drug: nitroGLYCERIN   Place 0.4 mg under the tongue every 5 (five) minutes as needed. For chest pain      oxyCODONE 5 MG immediate release tablet   Commonly known as: Oxy IR/ROXICODONE   Take 5-10 mg by mouth every 4 (four) hours as needed. For pain      potassium chloride SA 20 MEQ tablet   Commonly known as: K-DUR,KLOR-CON   Take 20 mEq by mouth daily.      warfarin 5 MG tablet   Commonly known as: COUMADIN   Take 5 mg by mouth every evening.           BRIEF HPI:  See H&P, Labs, Consult and Test reports for all details in brief, [name] is a 71 y.o. female with Multiple Medical problems who presents to the ED with complaints of progressive  weakness over the past week, and has been unable to walk today due to weakness. She reports having back pain and fever and chills over there past 24 hours.  CONSULTATIONS:  None  PERTINENT RADIOLOGIC STUDIES: Dg Chest 2 View  08/08/2012  *RADIOLOGY REPORT*  Clinical Data: Fever, weakness, shortness of breath.  CHEST - 2 VIEW  Comparison: 05/13/2012  Findings: Left AICD remains in place, unchanged.  Prior median sternotomy and valve replacement.  Cardiomegaly.  No focal opacities, effusions or edema.  No acute bony abnormality.  IMPRESSION: Cardiomegaly.  No active disease.   Original Report Authenticated By: Charlett Nose, M.D.    Mm Digital Screening  08/04/2012  *RADIOLOGY REPORT*  Clinical Data: Screening.  DIGITAL BILATERAL SCREENING MAMMOGRAM WITH CAD DIGITAL BREAST TOMOSYNTHESIS  Digital breast tomosynthesis images are acquired in two projections.  These images are reviewed in combination with the digital mammogram, confirming the findings below.  Comparison:   Previous exams.  Findings:  There are scattered fibroglandular densities. No suspicious masses, architectural distortion, or calcifications are present.  Images were processed with CAD.  IMPRESSION: No mammographic evidence of malignancy.  A result letter of this screening mammogram will be mailed directly to the patient.  RECOMMENDATION: Screening mammogram in one year. (Code:SM-B-01Y)  BI-RADS CATEGORY 1:  Negative.   Original Report Authenticated By: Norva Pavlov, M.D.      PERTINENT LAB RESULTS: CBC:  Basename 08/09/12 0645 08/08/12 2107  WBC 7.8 10.1  HGB 11.3* 12.1  HCT 34.5* 36.3  PLT 151 166   CMET CMP     Component Value Date/Time   NA 139 08/10/2012 0553   K 3.5 08/10/2012 0553   CL 102 08/10/2012 0553   CO2 25 08/10/2012 0553   GLUCOSE 83 08/10/2012 0553   BUN 29* 08/10/2012 0553   CREATININE 1.34* 08/10/2012 0553   CREATININE 1.39* 02/13/2012 0919   CALCIUM 8.7 08/10/2012 0553   PROT 7.1 07/08/2012 1420   ALBUMIN 3.9 07/08/2012 1420   AST 13 07/08/2012 1420   ALT 8 07/08/2012 1420   ALKPHOS 97 07/08/2012 1420   BILITOT 0.3 07/08/2012 1420   GFRNONAA 39* 08/10/2012 0553   GFRAA 45* 08/10/2012 0553    GFR Estimated Creatinine Clearance: 37.2 ml/min (by C-G formula based on Cr of 1.34). No results found for this basename: LIPASE:2,AMYLASE:2 in the last 72 hours  Basename 08/08/12 2108  CKTOTAL --  CKMB --  CKMBINDEX --  TROPONINI <0.30   No components found with this basename: POCBNP:3 No results found for this basename: DDIMER:2 in the last 72 hours No results found for this basename: HGBA1C:2 in the last 72 hours No results found for this basename: CHOL:2,HDL:2,LDLCALC:2,TRIG:2,CHOLHDL:2,LDLDIRECT:2 in the last 72 hours No results found for this basename: TSH,T4TOTAL,FREET3,T3FREE,THYROIDAB in the last 72 hours No results found for this basename: VITAMINB12:2,FOLATE:2,FERRITIN:2,TIBC:2,IRON:2,RETICCTPCT:2 in the last 72 hours Coags:  Basename  08/10/12 0553 08/09/12 0645  INR 1.64* 1.79*   Microbiology: Recent Results (from the past 240 hour(s))  URINE CULTURE     Status: Normal   Collection Time   08/08/12  9:09 PM      Component Value Range Status Comment   Specimen Description URINE, CATHETERIZED   Final    Special Requests NONE   Final    Culture  Setup Time 07/08/2012 22:02   Final    Colony Count 60,000 COLONIES/ML   Final    Culture ESCHERICHIA COLI   Final    Report Status 08/10/2012 FINAL   Final  Organism ID, Bacteria ESCHERICHIA COLI   Final      BRIEF HOSPITAL COURSE:   Principal Problem:  *Acute pyelonephritis -admitted with fever and b/l flank pain, admitted and started on IV Rocephin. Urine culture showing pan-sensitive E Coli. Afebrile for past 48 hours, pain almost resolved, she will be transitioned to Ciprofloxacin on discharge.  Active Problems:  HYPERTENSION -stable during this admit -c/w home meds on discharge   Atrial fibrillation -on chronic coumadin therapy-INR subtherapeutic, will not change her home dose of coumadin as she will be on Ciprofloxacin, she will follow up with the [facility] clinic for further monitoring on 11/15. -this was stable this admit   COMBINED HEART FAILURE, ACUTE ON CHRONIC -clinically compensated   Acute on chronic kidney disease, stage 3 -stable, and at close to baseline  Recent AVR -follow with primary cardiologist     TODAY-DAY OF DISCHARGE:  Subjective:   Haylo Bourdeau today has no headache,no chest abdominal pain,no new weakness tingling or numbness, feels much better wants to go home today.   Objective:   Blood pressure 142/85, pulse 71, temperature 97.8 F (36.6 C), temperature source Oral, resp. rate 16, height 5' 2.4" (1.585 m), weight 74.4 kg (164 lb 0.4 oz), SpO2 91.00%.  Intake/Output Summary (Last 24 hours) at 08/10/12 1000 Last data filed at 08/10/12 0900  Gross per 24 hour  Intake    570 ml  Output    900 ml  Net   -330 ml     Exam Awake Alert, Oriented *3, No new F.N deficits, Normal affect [place].AT,PERRAL Supple Neck,No JVD, No cervical lymphadenopathy appriciated.  Symmetrical Chest wall movement, Good air movement bilaterally, CTAB RRR,No Gallops,Rubs or new Murmurs, No Parasternal Heave +ve B.Sounds, Abd Soft, Non tender, No organomegaly appriciated, No rebound -guarding or rigidity.No CVA tenderness today No Cyanosis, Clubbing or edema, No new Rash or bruise  DISCHARGE CONDITION: Stable  DISPOSITION: HOME  DISCHARGE INSTRUCTIONS:    Activity:  As tolerated with Full fall precautions use walker/cane & assistance as needed  Diet recommendation: Heart Healthy diet  Follow-up Information    Follow up with [name], [name], MD. Schedule an appointment as soon as possible for a visit in 1 week.   Contact information:   [address] [place] [address] [phone]       Follow up with [facility]. On 08/13/2012. (appointment at 1:15 pm)    Contact information:   at [address] location         Total Time spent on discharge equals 45 minutes.  SignedJeoffrey Massed 08/10/2012 10:00 AM

## 2012-08-10 NOTE — Care Management Note (Signed)
    Page 1 of 1   08/10/2012     2:06:32 PM   CARE MANAGEMENT NOTE 08/10/2012  Patient:  [name], [name]   Account Number:  [id number]  Date Initiated:  08/10/2012  Documentation initiated by:  Letha Cape  Subjective/Objective Assessment:   dx acute pyelonephritis  admit     Action/Plan:   Anticipated DC Date:  08/10/2012   Anticipated DC Plan:  HOME/SELF CARE      DC Planning Services  CM consult      Choice offered to / List presented to:             Status of service:  Completed, signed off Medicare Important Message given?   (If response is "NO", the following Medicare IM given date fields will be blank) Date Medicare IM given:   Date Additional Medicare IM given:    Discharge Disposition:  HOME/SELF CARE  Per UR Regulation:  Reviewed for med. necessity/level of care/duration of stay  If discussed at Long Length of Stay Meetings, dates discussed:    Comments:  08/10/12 14:03 Letha Cape RN, BSN [phone] patient for dc today, no needs anticipated.

## 2012-08-13 ENCOUNTER — Ambulatory Visit (INDEPENDENT_AMBULATORY_CARE_PROVIDER_SITE_OTHER): Payer: Medicare Other

## 2012-08-13 DIAGNOSIS — Z954 Presence of other heart-valve replacement: Secondary | ICD-10-CM

## 2012-08-13 DIAGNOSIS — I4891 Unspecified atrial fibrillation: Secondary | ICD-10-CM

## 2012-08-13 DIAGNOSIS — I359 Nonrheumatic aortic valve disorder, unspecified: Secondary | ICD-10-CM

## 2012-08-13 DIAGNOSIS — I352 Nonrheumatic aortic (valve) stenosis with insufficiency: Secondary | ICD-10-CM

## 2012-08-16 ENCOUNTER — Encounter: Payer: Medicare Other | Admitting: *Deleted

## 2012-08-17 ENCOUNTER — Ambulatory Visit (HOSPITAL_COMMUNITY): Payer: Medicare Other | Attending: Cardiology | Admitting: Radiology

## 2012-08-17 DIAGNOSIS — I252 Old myocardial infarction: Secondary | ICD-10-CM | POA: Insufficient documentation

## 2012-08-17 DIAGNOSIS — I359 Nonrheumatic aortic valve disorder, unspecified: Secondary | ICD-10-CM

## 2012-08-17 DIAGNOSIS — Z8673 Personal history of transient ischemic attack (TIA), and cerebral infarction without residual deficits: Secondary | ICD-10-CM | POA: Insufficient documentation

## 2012-08-17 DIAGNOSIS — I4891 Unspecified atrial fibrillation: Secondary | ICD-10-CM | POA: Insufficient documentation

## 2012-08-17 DIAGNOSIS — Z952 Presence of prosthetic heart valve: Secondary | ICD-10-CM

## 2012-08-17 NOTE — Progress Notes (Signed)
Echocardiogram performed.  

## 2012-08-23 ENCOUNTER — Telehealth: Payer: Self-pay | Admitting: *Deleted

## 2012-08-23 NOTE — Telephone Encounter (Signed)
Advised patient and left message on Kay's voicemail

## 2012-08-23 NOTE — Telephone Encounter (Signed)
Message copied by Burnell Blanks on Mon Aug 23, 2012  1:55 PM ------      Message from: [name]      Created: Wed Aug 18, 2012  4:03 PM       Please report.  The echocardiogram shows good function of the aortic prosthetic valve.  Since she has had her new prosthetic valve, her left ventricular ejection fraction has improved also.  Continue same medication.

## 2012-08-24 ENCOUNTER — Ambulatory Visit (INDEPENDENT_AMBULATORY_CARE_PROVIDER_SITE_OTHER): Payer: Medicare Other | Admitting: *Deleted

## 2012-08-24 ENCOUNTER — Encounter: Payer: Self-pay | Admitting: *Deleted

## 2012-08-24 DIAGNOSIS — Z954 Presence of other heart-valve replacement: Secondary | ICD-10-CM

## 2012-08-24 DIAGNOSIS — I359 Nonrheumatic aortic valve disorder, unspecified: Secondary | ICD-10-CM

## 2012-08-24 DIAGNOSIS — I352 Nonrheumatic aortic (valve) stenosis with insufficiency: Secondary | ICD-10-CM

## 2012-08-24 DIAGNOSIS — I4891 Unspecified atrial fibrillation: Secondary | ICD-10-CM

## 2012-08-24 LAB — POCT INR: INR: 2.3

## 2012-09-01 ENCOUNTER — Other Ambulatory Visit: Payer: Self-pay

## 2012-09-01 MED ORDER — POTASSIUM CHLORIDE CRYS ER 20 MEQ PO TBCR: 20.0000 meq | EXTENDED_RELEASE_TABLET | Freq: Every day | ORAL | Status: DC

## 2012-09-02 ENCOUNTER — Encounter: Payer: Self-pay | Admitting: Internal Medicine

## 2012-09-02 ENCOUNTER — Ambulatory Visit (INDEPENDENT_AMBULATORY_CARE_PROVIDER_SITE_OTHER): Payer: Medicare Other | Admitting: Internal Medicine

## 2012-09-02 VITALS — HR 73 | Temp 98.0°F | Resp 16 | Ht 62.0 in | Wt 173.1 lb

## 2012-09-02 DIAGNOSIS — N10 Acute tubulo-interstitial nephritis: Secondary | ICD-10-CM

## 2012-09-02 DIAGNOSIS — M545 Low back pain, unspecified: Secondary | ICD-10-CM

## 2012-09-02 DIAGNOSIS — E785 Hyperlipidemia, unspecified: Secondary | ICD-10-CM

## 2012-09-02 DIAGNOSIS — R21 Rash and other nonspecific skin eruption: Secondary | ICD-10-CM

## 2012-09-02 DIAGNOSIS — M549 Dorsalgia, unspecified: Secondary | ICD-10-CM

## 2012-09-02 DIAGNOSIS — N390 Urinary tract infection, site not specified: Secondary | ICD-10-CM

## 2012-09-02 LAB — LIPID PANEL
HDL: 74 mg/dL (ref >39)
Total CHOL/HDL Ratio: 3.6 Ratio
Triglycerides: 71 mg/dL (ref <150)

## 2012-09-02 MED ORDER — POTASSIUM CHLORIDE CRYS ER 20 MEQ PO TBCR: 20.0000 meq | EXTENDED_RELEASE_TABLET | Freq: Every day | ORAL | Status: DC

## 2012-09-02 MED ORDER — BIOTIN 5000 MCG PO CAPS: 1.0000 | ORAL_CAPSULE | Freq: Every day | ORAL | Status: DC

## 2012-09-02 MED ORDER — TRIAMCINOLONE ACETONIDE 0.1 % EX CREA: TOPICAL_CREAM | Freq: Two times a day (BID) | CUTANEOUS | Status: DC

## 2012-09-02 MED ORDER — OXYCODONE HCL 5 MG PO TABS: 5.0000 mg | ORAL_TABLET | Freq: Four times a day (QID) | ORAL | Status: DC | PRN

## 2012-09-03 LAB — URINALYSIS, ROUTINE W REFLEX MICROSCOPIC
Bilirubin Urine: NEGATIVE
Glucose, UA: NEGATIVE mg/dL
Hgb urine dipstick: NEGATIVE
Ketones, ur: NEGATIVE mg/dL
Protein, ur: NEGATIVE mg/dL
pH: 7 (ref 5.0–8.0)

## 2012-09-04 DIAGNOSIS — R21 Rash and other nonspecific skin eruption: Secondary | ICD-10-CM | POA: Insufficient documentation

## 2012-09-04 LAB — URINE CULTURE

## 2012-09-04 NOTE — Assessment & Plan Note (Signed)
Chronic intermittent. Schedule PT

## 2012-09-04 NOTE — Assessment & Plan Note (Signed)
Begin triamcinolone. Followup if no improvement or worsening.

## 2012-09-04 NOTE — Progress Notes (Signed)
  Subjective:    Patient ID: [name], female    DOB: 1941-07-05, 71 y.o.   MRN: [id number]  HPI Pt presents to clinic for followup of multiple medical problems. Reviewed recent hospitalization for pyelonephritis. Had associated weakness, fever and back pain. Now asx. Denies urinary sx's. Notes chronic intermittent lbp without radicular sx's. Taking oxycodone qd prn. Also notes bilateral le itchy rash that is not spreading. No obvious trigger.  Past Medical History  Diagnosis Date  . History of ventricular tachycardia   . Peripheral edema   . Aortic insufficiency     Moderate 2012  . History of gout   . Exogenous obesity   . History of CHF (congestive heart failure)     EF 45% 2012, nonischemic (Normal coronaries 2002)  . 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 Hypertension   . 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 ICD (implantable cardiac defibrillator) in place 2012  . DOE (dyspnea on exertion)   . Stroke 2000  . Renal insufficiency   . Myocardial infarction 2000  . Anxiety   . 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 Anemia   . Presence of IVC filter 2000   Past Surgical History  Procedure Date  . Cardiac defibrillator placement   . Cataract extraction   . Embolectomy 02/15/2012    Procedure: EMBOLECTOMY;  Surgeon: [name], MD;  Location: [facility] OR;  Service: Vascular;  Laterality: Left;  left popliteal embolectomy with drain placement  . Insert / replace / remove pacemaker   . Eye surgery 2012    removal of cataracts bilaterally  . Cardiac catheterization   . Tonsillectomy   . Aortic valve replacement 04/12/2012    Procedure: AORTIC VALVE REPLACEMENT (AVR);  Surgeon: Delight Ovens, MD;  Location: [facility] OR;  Service: Open Heart Surgery;  Laterality: N/A;  with nitric oxide  . Thoracic aortic aneurysm repair 04/12/2012    Procedure: THORACIC ASCENDING  ANEURYSM REPAIR (AAA);  Surgeon: Delight Ovens, MD;  Location: [facility] OR;  Service: Open Heart Surgery;  Laterality: N/A;  . Clot removal 5.2013    blood clot removed from the back of the left leg  . Aortic valve surgery 7.2013    reports that she has never smoked. She has never used smokeless tobacco. She reports that she does not drink alcohol or use illicit drugs. family history includes Cancer in her father; Coronary artery disease (age of onset:60) in her father; and Hypertension in her father. Allergies  Allergen Reactions  . Adhesive (Tape) Other (See Comments)    Redness and skin breaks down  . Latex Rash  . Neomycin-Bacitracin Zn-Polymyx Rash      Review of Systems see hpi     Objective:   Physical Exam  Nursing note and vitals reviewed. Constitutional: She appears well-developed and well-nourished. No distress.  HENT:  Head: Normocephalic and atraumatic.  Eyes: Conjunctivae normal are normal. No scleral icterus.  Cardiovascular: Normal rate, regular rhythm and normal heart sounds.   Pulmonary/Chest: Effort normal and breath sounds normal. No respiratory distress. She has no wheezes. She has no rales.  Neurological: She is alert.  Skin: She is not diaphoretic.  Psychiatric: She has a normal mood and affect.          Assessment & Plan:

## 2012-09-04 NOTE — Assessment & Plan Note (Signed)
Asx.obtain ua and cx.

## 2012-09-07 ENCOUNTER — Ambulatory Visit (INDEPENDENT_AMBULATORY_CARE_PROVIDER_SITE_OTHER): Payer: Medicare Other

## 2012-09-07 ENCOUNTER — Other Ambulatory Visit: Payer: Self-pay

## 2012-09-07 DIAGNOSIS — Z954 Presence of other heart-valve replacement: Secondary | ICD-10-CM

## 2012-09-07 DIAGNOSIS — I359 Nonrheumatic aortic valve disorder, unspecified: Secondary | ICD-10-CM

## 2012-09-07 DIAGNOSIS — I352 Nonrheumatic aortic (valve) stenosis with insufficiency: Secondary | ICD-10-CM

## 2012-09-07 DIAGNOSIS — I4891 Unspecified atrial fibrillation: Secondary | ICD-10-CM

## 2012-09-07 LAB — POCT INR: INR: 3.4

## 2012-09-07 MED ORDER — POTASSIUM CHLORIDE CRYS ER 20 MEQ PO TBCR: 20.0000 meq | EXTENDED_RELEASE_TABLET | Freq: Every day | ORAL | Status: DC

## 2012-09-08 ENCOUNTER — Other Ambulatory Visit: Payer: Self-pay | Admitting: *Deleted

## 2012-09-08 ENCOUNTER — Telehealth: Payer: Self-pay | Admitting: Cardiology

## 2012-09-08 DIAGNOSIS — E785 Hyperlipidemia, unspecified: Secondary | ICD-10-CM

## 2012-09-08 MED ORDER — ROSUVASTATIN CALCIUM 5 MG PO TABS: 5.0000 mg | ORAL_TABLET | Freq: Every day | ORAL | Status: DC

## 2012-09-08 NOTE — Telephone Encounter (Signed)
Will forward to  Dr. Brackbill for review 

## 2012-09-08 NOTE — Telephone Encounter (Signed)
New problem:    Went to Dr. Rodena Medin office today  Was told she have high cholesterol. Is this ok for patient to take crestor.

## 2012-09-08 NOTE — Addendum Note (Signed)
Addended by: Candie Echevaria L on: 09/08/2012 03:19 PM   Modules accepted: Orders

## 2012-09-08 NOTE — Telephone Encounter (Signed)
No answer

## 2012-09-08 NOTE — Telephone Encounter (Signed)
Okay with [place] if patient takes statin such as Crestor

## 2012-09-13 NOTE — Telephone Encounter (Signed)
Advised patient

## 2012-09-14 ENCOUNTER — Ambulatory Visit: Payer: Medicare Other | Admitting: Family

## 2012-09-28 ENCOUNTER — Ambulatory Visit (INDEPENDENT_AMBULATORY_CARE_PROVIDER_SITE_OTHER): Payer: Medicare Other | Admitting: Pharmacist

## 2012-09-28 DIAGNOSIS — I359 Nonrheumatic aortic valve disorder, unspecified: Secondary | ICD-10-CM

## 2012-09-28 DIAGNOSIS — Z954 Presence of other heart-valve replacement: Secondary | ICD-10-CM

## 2012-09-28 DIAGNOSIS — I4891 Unspecified atrial fibrillation: Secondary | ICD-10-CM

## 2012-09-28 DIAGNOSIS — I352 Nonrheumatic aortic (valve) stenosis with insufficiency: Secondary | ICD-10-CM

## 2012-09-28 LAB — POCT INR: INR: 2.1

## 2012-09-30 ENCOUNTER — Telehealth: Payer: Self-pay | Admitting: *Deleted

## 2012-09-30 MED ORDER — OXYCODONE HCL 5 MG PO TABS: 5.0000 mg | ORAL_TABLET | Freq: Four times a day (QID) | ORAL | Status: DC | PRN

## 2012-09-30 MED ORDER — TRIAMCINOLONE ACETONIDE 0.1 % EX CREA: TOPICAL_CREAM | Freq: Two times a day (BID) | CUTANEOUS | Status: DC

## 2012-09-30 NOTE — Telephone Encounter (Signed)
Pt has completed triamcinolone for itchy rash on her legs. Rash and itching are some better but not completely resolved. Wants to know if she should get a refill or try something different?  Also reports that she continues to have low back pain from her waist to her hip areas. Reports that she has been having difficulty with bowel movements and has been taking dulcolax as needed.  Usually has a BM every 3-4 days and has been this way for "years". Reports that if she doesn't take dulcolax she doesn't feel she will have a BM. She denies any changes in urinary habits. Reports that she was never contacted about setting up PT for her back and will only follow through with PT if her insurance will pay for it. She doesn't know if her insurance will cover this and states she thought we were going to find out for her? She is requesting a refill of oxycodone.  Please advise.

## 2012-09-30 NOTE — Telephone Encounter (Signed)
Notified pt and she voices understanding. Advised pt that per [facility] PT is covered at $35 copay for each visit. Pt states she will be unable to afford that and does not want to proceed with PT. Triamcinolone refilled, rx printed for oxycodone and forwarded to Provider for signature.

## 2012-09-30 NOTE — Telephone Encounter (Signed)
Retry triamcinolone longer. rf if necessary. Pt referral done 12/5-pls check with helen. Ok to refill oxycodone. Very common to have constipation with narcotic pain medication. Would take colace bid and benefiber daily to help

## 2012-10-01 NOTE — Telephone Encounter (Signed)
Pt informed Rx must be p/u at medical office, understood & will pick-up/SLS

## 2012-10-01 NOTE — Telephone Encounter (Signed)
Signed rx placed at front desk, attempted to notify pt and was unable to leave message on hm #. Left message on cell# and to call if any questions.

## 2012-10-22 ENCOUNTER — Telehealth: Payer: Self-pay | Admitting: *Deleted

## 2012-10-22 ENCOUNTER — Other Ambulatory Visit: Payer: Self-pay

## 2012-10-22 ENCOUNTER — Encounter: Payer: Self-pay | Admitting: *Deleted

## 2012-10-22 MED ORDER — ASPIRIN 81 MG PO TBEC: 81.0000 mg | DELAYED_RELEASE_TABLET | Freq: Every day | ORAL | Status: AC

## 2012-10-22 NOTE — Telephone Encounter (Signed)
Sent rx for ASA as requested

## 2012-10-25 ENCOUNTER — Ambulatory Visit (INDEPENDENT_AMBULATORY_CARE_PROVIDER_SITE_OTHER): Payer: Medicare Other | Admitting: *Deleted

## 2012-10-25 DIAGNOSIS — Z954 Presence of other heart-valve replacement: Secondary | ICD-10-CM

## 2012-10-25 DIAGNOSIS — I4891 Unspecified atrial fibrillation: Secondary | ICD-10-CM

## 2012-10-25 DIAGNOSIS — I352 Nonrheumatic aortic (valve) stenosis with insufficiency: Secondary | ICD-10-CM

## 2012-10-25 DIAGNOSIS — I359 Nonrheumatic aortic valve disorder, unspecified: Secondary | ICD-10-CM

## 2012-10-25 LAB — POCT INR: INR: 2.2

## 2012-10-29 ENCOUNTER — Encounter: Payer: Self-pay | Admitting: Family

## 2012-10-29 ENCOUNTER — Ambulatory Visit (INDEPENDENT_AMBULATORY_CARE_PROVIDER_SITE_OTHER): Payer: Medicare Other | Admitting: Family

## 2012-10-29 VITALS — HR 74 | Temp 97.4°F | Wt 173.0 lb

## 2012-10-29 DIAGNOSIS — H9202 Otalgia, left ear: Secondary | ICD-10-CM | POA: Insufficient documentation

## 2012-10-29 DIAGNOSIS — H9209 Otalgia, unspecified ear: Secondary | ICD-10-CM

## 2012-10-29 NOTE — Progress Notes (Signed)
  Subjective:    Patient ID: [name], female    DOB: March 05, 1941, 72 y.o.   MRN: [id number]  HPI  Reports that she has had some left sided ear pain since the weekend.  "popping with swallowing."  She had some associated irritation.  She denies associated cough, or sinus congestion.  Right now she is not having symptoms. She declines BP check today.  Review of Systems See HPI    Objective:   Physical Exam  Constitutional: She is oriented to person, place, and time. She appears well-developed and well-nourished. No distress.  HENT:  Head: Normocephalic and atraumatic.  Right Ear: Tympanic membrane and ear canal normal.  Left Ear: Tympanic membrane and ear canal normal.  Mouth/Throat: No oropharyngeal exudate, posterior oropharyngeal edema or posterior oropharyngeal erythema.  Neck: No tracheal deviation present. No thyromegaly present.  Cardiovascular: Normal rate and regular rhythm.   No murmur heard. Pulmonary/Chest: Effort normal and breath sounds normal. No respiratory distress. She has no wheezes. She has no rales. She exhibits no tenderness.  Lymphadenopathy:    She has no cervical adenopathy.  Neurological: She is alert and oriented to person, place, and time.  Psychiatric: She has a normal mood and affect. Her behavior is normal. Judgment and thought content normal.          Assessment & Plan:

## 2012-10-29 NOTE — Patient Instructions (Addendum)
Please follow up in April, soon if problems/concerns.

## 2012-10-29 NOTE — Assessment & Plan Note (Signed)
No pain today, normal exam.  Monitor.

## 2012-11-04 ENCOUNTER — Encounter: Payer: Medicare Other | Admitting: Internal Medicine

## 2012-11-10 ENCOUNTER — Encounter: Payer: Medicare Other | Admitting: Internal Medicine

## 2012-11-29 ENCOUNTER — Other Ambulatory Visit: Payer: Self-pay | Admitting: Internal Medicine

## 2012-12-01 ENCOUNTER — Encounter: Payer: Self-pay | Admitting: Internal Medicine

## 2012-12-01 ENCOUNTER — Ambulatory Visit (INDEPENDENT_AMBULATORY_CARE_PROVIDER_SITE_OTHER): Payer: Medicare Other | Admitting: *Deleted

## 2012-12-01 ENCOUNTER — Ambulatory Visit (INDEPENDENT_AMBULATORY_CARE_PROVIDER_SITE_OTHER): Payer: Medicare Other | Admitting: Internal Medicine

## 2012-12-01 ENCOUNTER — Other Ambulatory Visit: Payer: Self-pay | Admitting: *Deleted

## 2012-12-01 VITALS — BP 103/60 | HR 83 | Wt 181.2 lb

## 2012-12-01 DIAGNOSIS — I352 Nonrheumatic aortic (valve) stenosis with insufficiency: Secondary | ICD-10-CM

## 2012-12-01 DIAGNOSIS — Z954 Presence of other heart-valve replacement: Secondary | ICD-10-CM

## 2012-12-01 DIAGNOSIS — I509 Heart failure, unspecified: Secondary | ICD-10-CM

## 2012-12-01 DIAGNOSIS — I5023 Acute on chronic systolic (congestive) heart failure: Secondary | ICD-10-CM

## 2012-12-01 DIAGNOSIS — I4891 Unspecified atrial fibrillation: Secondary | ICD-10-CM

## 2012-12-01 DIAGNOSIS — M7502 Adhesive capsulitis of left shoulder: Secondary | ICD-10-CM

## 2012-12-01 DIAGNOSIS — Z95810 Presence of automatic (implantable) cardiac defibrillator: Secondary | ICD-10-CM

## 2012-12-01 DIAGNOSIS — I359 Nonrheumatic aortic valve disorder, unspecified: Secondary | ICD-10-CM

## 2012-12-01 DIAGNOSIS — M7500 Adhesive capsulitis of unspecified shoulder: Secondary | ICD-10-CM

## 2012-12-01 DIAGNOSIS — I469 Cardiac arrest, cause unspecified: Secondary | ICD-10-CM

## 2012-12-01 LAB — ICD DEVICE OBSERVATION
BATTERY VOLTAGE: 3.09 V
BRDY-0002RV: 50 {beats}/min
FVT: 0
PACEART VT: 0
RV LEAD IMPEDENCE ICD: 928 Ohm
RV LEAD THRESHOLD: 1 V
TOT-0002: 0
TZAT-0004SLOWVT: 8
TZAT-0004SLOWVT: 8
TZAT-0005SLOWVT: 81 pct
TZAT-0011SLOWVT: 10 ms
TZAT-0011SLOWVT: 10 ms
TZAT-0012FASTVT: 200 ms
TZAT-0012SLOWVT: 200 ms
TZAT-0012SLOWVT: 200 ms
TZAT-0020FASTVT: 1.5 ms
TZON-0003SLOWVT: 330 ms
TZON-0003VSLOWVT: 450 ms
TZST-0001FASTVT: 2
TZST-0001FASTVT: 6
TZST-0001SLOWVT: 4
TZST-0001SLOWVT: 5
TZST-0002FASTVT: NEGATIVE
TZST-0002FASTVT: NEGATIVE
TZST-0002FASTVT: NEGATIVE
TZST-0003SLOWVT: 15 J
TZST-0003SLOWVT: 35 J
TZST-0003SLOWVT: 35 J

## 2012-12-01 LAB — POCT INR: INR: 2.1

## 2012-12-01 MED ORDER — CARVEDILOL 6.25 MG PO TABS: 6.2500 mg | ORAL_TABLET | Freq: Two times a day (BID) | ORAL | Status: DC

## 2012-12-01 NOTE — Patient Instructions (Addendum)
Your physician recommends that you schedule a follow-up appointment in: 3 months with [facility]     

## 2012-12-01 NOTE — Assessment & Plan Note (Signed)
No intercurrent arrhythmia

## 2012-12-01 NOTE — Assessment & Plan Note (Signed)
I tried to find out a course of physical therapy evaluation for her. Hopefully when she sees Dr. Patty Sermons on Friday we will have a next step. She does not have an orthopedist whom she sees that she remembers.

## 2012-12-01 NOTE — Assessment & Plan Note (Signed)
The patient's device was interrogated.  The information was reviewed. No changes were made in the programming.    

## 2012-12-01 NOTE — Progress Notes (Signed)
HPI  [name] is a 72 y.o. female is seen in followup for aborted cardiac arrest s/p  implanted ICD. 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  She underwent aortic valve in a sitting replacement fall 2013. She is significant dilatation of her LV. This significant dilatation of the LV.  Her major complaint now is pain in her left shoulder and significant limitations which have evolved since her surgery   Chronic dyspnea persists  Past Medical History  Diagnosis Date  . History of ventricular tachycardia   . Peripheral edema   . Aortic insufficiency     Moderate 2012  . History of gout   . Exogenous obesity   . History of CHF (congestive heart failure)     EF 45% 2012, nonischemic (Normal coronaries 2002)  . 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 Hypertension   . 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 ICD (implantable cardiac defibrillator) in place 2012  . DOE (dyspnea on exertion)   . Stroke 2000  . Renal insufficiency   . Myocardial infarction 2000  . Anxiety   . 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 Anemia   . Presence of IVC filter 2000    Past Surgical History  Procedure Laterality Date  . Cardiac defibrillator placement    . Cataract extraction    . Embolectomy  02/15/2012    Procedure: EMBOLECTOMY;  Surgeon: [name], MD;  Location: [facility] OR;  Service: Vascular;  Laterality: Left;  left popliteal embolectomy with drain placement  . Insert / replace / remove pacemaker    . Eye surgery  2012    removal of cataracts bilaterally  . Cardiac catheterization    . Tonsillectomy    . Aortic valve replacement  04/12/2012    Procedure: AORTIC VALVE REPLACEMENT (AVR);  Surgeon: Delight Ovens, MD;  Location: [facility] OR;  Service: Open Heart Surgery;  Laterality: N/A;  with nitric  oxide  . Thoracic aortic aneurysm repair  04/12/2012    Procedure: THORACIC ASCENDING ANEURYSM REPAIR (AAA);  Surgeon: Delight Ovens, MD;  Location: [facility] OR;  Service: Open Heart Surgery;  Laterality: N/A;  . Clot removal  5.2013    blood clot removed from the back of the left leg  . Aortic valve surgery  7.2013    Current Outpatient Prescriptions  Medication Sig Dispense Refill  . allopurinol (ZYLOPRIM) 100 MG tablet Take 100 mg by mouth 2 (two) times daily.      [name] aspirin 81 MG EC tablet Take 1 tablet (81 mg total) by mouth daily.  100 tablet  prn  . Biotin 5000 MCG CAPS Take 1 capsule (5,000 mcg total) by mouth daily.  30 capsule  6  . carvedilol (COREG) 6.25 MG tablet Take 1 tablet (6.25 mg total) by mouth 2 (two) times daily with a meal.  60 tablet  5  . ciprofloxacin (CIPRO) 500 MG tablet Take 1 tablet (500 mg total) by mouth 2 (two) times daily.  14 tablet  0  . CRESTOR 5 MG tablet TAKE 1 TABLET (5 MG TOTAL) BY MOUTH DAILY.  30 tablet  2  . furosemide (LASIX) 40 MG tablet Take 1 tablet (40 mg total) by mouth daily.  30 tablet  1  . NITROSTAT 0.4 MG SL tablet Place 0.4 mg under the tongue  every 5 (five) minutes as needed. For chest pain      . oxyCODONE (OXY IR/ROXICODONE) 5 MG immediate release tablet Take 1 tablet (5 mg total) by mouth every 6 (six) hours as needed. For pain  30 tablet  0  . potassium chloride SA (K-DUR,KLOR-CON) 20 MEQ tablet Take 1 tablet (20 mEq total) by mouth daily.  30 tablet  5  . triamcinolone cream (KENALOG) 0.1 % Apply topically 2 (two) times daily.  60 g  0  . warfarin (COUMADIN) 5 MG tablet Take 5 mg by mouth every evening.        No current facility-administered medications for this visit.    Allergies  Allergen Reactions  . Adhesive (Tape) Other (See Comments)    Redness and skin breaks down  . Latex Rash  . Neomycin-Bacitracin Zn-Polymyx Rash    Review of Systems negative except from HPI and PMH  Physical Exam BP 103/60  Pulse 83  Wt 181  lb 3.2 oz (82.192 kg)  BMI 33.13 kg/m2 Well developed and well nourished in no acute distress HENT normal E scleral and icterus clear Neck Supple JVP flat; carotids brisk and full Clear to ausculation Device pocket well healed; without hematoma or erythema There is significant atrophy and downward migration of the device Regular rate and rhythm, no murmurs gallops or rub Soft with active bowel sounds No clubbing cyanosis Trace and nonpitting  Edema Marked restriction of L arm abduction Alert and oriented,of her memory is surprisingly limited grossly normal motor and sensory function Skin Warm and Dry   Assessment and  Plan

## 2012-12-03 ENCOUNTER — Other Ambulatory Visit: Payer: Medicare Other

## 2012-12-03 ENCOUNTER — Ambulatory Visit: Payer: Medicare Other | Admitting: Cardiology

## 2012-12-06 ENCOUNTER — Ambulatory Visit: Payer: Medicare Other | Admitting: Internal Medicine

## 2012-12-27 ENCOUNTER — Other Ambulatory Visit: Payer: Medicare Other

## 2012-12-27 ENCOUNTER — Ambulatory Visit: Payer: Medicare Other | Admitting: Cardiology

## 2012-12-29 ENCOUNTER — Other Ambulatory Visit (INDEPENDENT_AMBULATORY_CARE_PROVIDER_SITE_OTHER): Payer: Medicare Other

## 2012-12-29 ENCOUNTER — Encounter: Payer: Self-pay | Admitting: Cardiology

## 2012-12-29 ENCOUNTER — Ambulatory Visit (INDEPENDENT_AMBULATORY_CARE_PROVIDER_SITE_OTHER): Payer: Medicare Other | Admitting: Cardiology

## 2012-12-29 ENCOUNTER — Ambulatory Visit (INDEPENDENT_AMBULATORY_CARE_PROVIDER_SITE_OTHER): Payer: Medicare Other | Admitting: *Deleted

## 2012-12-29 VITALS — BP 140/72 | HR 69 | Ht 62.0 in | Wt 180.0 lb

## 2012-12-29 DIAGNOSIS — E7800 Pure hypercholesterolemia, unspecified: Secondary | ICD-10-CM

## 2012-12-29 DIAGNOSIS — E785 Hyperlipidemia, unspecified: Secondary | ICD-10-CM

## 2012-12-29 DIAGNOSIS — I4891 Unspecified atrial fibrillation: Secondary | ICD-10-CM

## 2012-12-29 DIAGNOSIS — I352 Nonrheumatic aortic (valve) stenosis with insufficiency: Secondary | ICD-10-CM

## 2012-12-29 DIAGNOSIS — I10 Essential (primary) hypertension: Secondary | ICD-10-CM

## 2012-12-29 DIAGNOSIS — Z954 Presence of other heart-valve replacement: Secondary | ICD-10-CM

## 2012-12-29 DIAGNOSIS — I359 Nonrheumatic aortic valve disorder, unspecified: Secondary | ICD-10-CM

## 2012-12-29 DIAGNOSIS — I5043 Acute on chronic combined systolic (congestive) and diastolic (congestive) heart failure: Secondary | ICD-10-CM

## 2012-12-29 LAB — POCT INR: INR: 2.4

## 2012-12-29 LAB — BASIC METABOLIC PANEL
BUN: 21 mg/dL (ref 6–23)
CO2: 25 mEq/L (ref 19–32)
Chloride: 104 mEq/L (ref 96–112)
Creatinine, Ser: 1.3 mg/dL — ABNORMAL HIGH (ref 0.4–1.2)
Potassium: 3.7 mEq/L (ref 3.5–5.1)

## 2012-12-29 LAB — CBC WITH DIFFERENTIAL/PLATELET
Basophils Relative: 0.8 % (ref 0.0–3.0)
Eosinophils Absolute: 0.3 10*3/uL (ref 0.0–0.7)
Eosinophils Relative: 4.9 % (ref 0.0–5.0)
HCT: 43.3 % (ref 36.0–46.0)
Lymphs Abs: 1.7 10*3/uL (ref 0.7–4.0)
MCHC: 33.4 g/dL (ref 30.0–36.0)
MCV: 92.7 fl (ref 78.0–100.0)
Monocytes Absolute: 0.7 10*3/uL (ref 0.1–1.0)
Neutrophils Relative %: 50.6 % (ref 43.0–77.0)
RBC: 4.68 Mil/uL (ref 3.87–5.11)
WBC: 5.4 10*3/uL (ref 4.5–10.5)

## 2012-12-29 LAB — LIPID PANEL
LDL Cholesterol: 90 mg/dL (ref 0–99)
Total CHOL/HDL Ratio: 3
VLDL: 21.6 mg/dL (ref 0.0–40.0)

## 2012-12-29 LAB — HEPATIC FUNCTION PANEL
Albumin: 4.1 g/dL (ref 3.5–5.2)
Alkaline Phosphatase: 92 U/L (ref 39–117)
Bilirubin, Direct: 0.1 mg/dL (ref 0.0–0.3)
Total Protein: 7.9 g/dL (ref 6.0–8.3)

## 2012-12-29 MED ORDER — CARVEDILOL 12.5 MG PO TABS: 12.5000 mg | ORAL_TABLET | Freq: Two times a day (BID) | ORAL | Status: DC

## 2012-12-29 NOTE — Progress Notes (Signed)
[name] Date of Birth:  March 24, 1941 [facility] [address] [place], [place]  [address] [phone]         Fax   [phone]  History of Present Illness: This pleasant 72 year old woman is seen for a post hospital office visit. 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 [name]. 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 She also has a dilated left ventricle with marked decrease in left ventricular systolic function. She is now on carvedilol 6.25 mg by mouth twice a day. Her symptoms of congestive heart failure have improved significantly since surgery. She is no longer having any ankle edema or significant orthopnea. Her weight is down since preop. She remains in chronic atrial fibrillation and is now on Coumadin.  Clinically she is much improved since her surgery.  She still has some residual soreness in her left shoulder and left arm which has been present since surgery but is gradually improving.   Current Outpatient Prescriptions  Medication Sig Dispense Refill  . allopurinol (ZYLOPRIM) 100 MG tablet Take 100 mg by mouth 2 (two) times daily.      [name] aspirin 81 MG EC tablet Take 1 tablet (81 mg total) by mouth daily.  100 tablet  prn  . Biotin 5000 MCG CAPS Take 1 capsule (5,000 mcg total) by mouth daily.  30 capsule  6  . carvedilol (COREG) 12.5 MG tablet Take 1 tablet (12.5 mg total) by mouth 2 (two) times daily with a meal.  60 tablet  5  . ciprofloxacin (CIPRO) 500 MG tablet Take 1 tablet (500 mg total) by mouth 2 (two) times daily.  14 tablet  0  . CRESTOR 5 MG tablet TAKE 1 TABLET (5 MG TOTAL) BY MOUTH DAILY.  30 tablet  2  . furosemide (LASIX) 40 MG tablet Take 1 tablet (40 mg total) by mouth daily.  30 tablet  1  . NITROSTAT 0.4 MG SL tablet Place 0.4 mg under the tongue every 5 (five) minutes as needed. For chest pain      . potassium chloride  SA (K-DUR,KLOR-CON) 20 MEQ tablet Take 1 tablet (20 mEq total) by mouth daily.  30 tablet  5  . triamcinolone cream (KENALOG) 0.1 % Apply topically 2 (two) times daily.  60 g  0  . warfarin (COUMADIN) 5 MG tablet Take 5 mg by mouth every evening.       [name] oxyCODONE (OXY IR/ROXICODONE) 5 MG immediate release tablet Take 1 tablet (5 mg total) by mouth every 6 (six) hours as needed. For pain  30 tablet  0   No current facility-administered medications for this visit.    Allergies  Allergen Reactions  . Adhesive (Tape) Other (See Comments)    Redness and skin breaks down  . Latex Rash  . Neomycin-Bacitracin Zn-Polymyx Rash    Patient Active Problem List  Diagnosis  . HYPERLIPIDEMIA  . GOUT  . HYPERKALEMIA  . HYPERTENSION  . Atrial fibrillation  . VENOUS INSUFFICIENCY  . BACK PAIN, LUMBAR  . MEMORY LOSS  . COMBINED HEART FAILURE, ACUTE ON CHRONIC  . ALKALINE PHOSPHATASE, ELEVATED  . Aortic insufficiency and aortic stenosis  . Acute on chronic renal insufficiency  . Cardiac arrest  . Implantable cardiac defibrillator single-chamber-Medtronic  . Alopecia  . Lower limb ischemia  . Acute on chronic systolic congestive heart failure, NYHA class 3  . Acute on  chronic kidney disease, stage 3  . Anemia  . Atherosclerosis of native arteries of the extremities with intermittent claudication  . Hypokalemia  . Heart valve replaced by other means  . Annual physical exam  . Acute pyelonephritis  . Fever  . Weakness generalized  . Rash  . Otalgia of left ear  . Frozen shoulder    History  Smoking status  . Never Smoker   Smokeless tobacco  . Never Used    History  Alcohol Use No    Family History  Problem Relation Age of Onset  . Hypertension Father   . Cancer Father   . Coronary artery disease Father 42    Review of Systems: Constitutional: no fever chills diaphoresis or fatigue or change in weight.  Head and neck: no hearing loss, no epistaxis, no photophobia or  visual disturbance. Respiratory: No cough, shortness of breath or wheezing. Cardiovascular: No chest pain peripheral edema, palpitations. Gastrointestinal: No abdominal distention, no abdominal pain, no change in bowel habits hematochezia or melena. Genitourinary: No dysuria, no frequency, no urgency, no nocturia. Musculoskeletal:No arthralgias, no back pain, no gait disturbance or myalgias. Neurological: No dizziness, no headaches, no numbness, no seizures, no syncope, no weakness, no tremors. Hematologic: No lymphadenopathy, no easy bruising. Psychiatric: No confusion, no hallucinations, no sleep disturbance.    Physical Exam: Filed Vitals:   12/29/12 1150  BP: 140/72  Pulse: 69   the general appearance reveals a well-developed well-nourished mildly obese woman in no acute distress.The head and neck exam reveals pupils equal and reactive.  Extraocular movements are full.  There is no scleral icterus.  The mouth and pharynx are normal.  The neck is supple.  The carotids reveal no bruits.  The jugular venous pressure is normal.  The  thyroid is not enlarged.  There is no lymphadenopathy.  The chest is clear to percussion and auscultation.  There are no rales or rhonchi.  Expansion of the chest is symmetrical.  The precordium is quiet.  The pulse is irregularly irregular .The first heart sound is normal.  The second heart sound is physiologically split.  There is no  gallop rub or click.  There is a soft systolic murmur across her prosthetic aortic valve.  There is no diastolic murmur. There is no abnormal lift or heave.  The abdomen is soft and nontender.  The bowel sounds are normal.  The liver and spleen are not enlarged.  There are no abdominal masses.  There are no abdominal bruits.  Extremities reveal good pedal pulses.  There is no phlebitis or edema.  There is no cyanosis or clubbing.  Strength is normal and symmetrical in all extremities.  There is no lateralizing weakness.  There are no  sensory deficits.  The skin is warm and dry.  There is no rash.     Assessment / Plan: Continue same medication except increase carvedilol to 12.5 mg twice a day. Recheck in 4 months for followup office visit EKG and fasting lipid panel hepatic function panel and basal metabolic panel

## 2012-12-29 NOTE — Assessment & Plan Note (Signed)
The patient remains in atrial fibrillation.  She still has a rapid ventricular response.  We will increase her carvedilol up to 12.5 mg twice a day.

## 2012-12-29 NOTE — Assessment & Plan Note (Signed)
The patient is not having any paroxysmal nocturnal dyspnea or significant pedal edema.  Her exercise tolerance is improving

## 2012-12-29 NOTE — Patient Instructions (Addendum)
Will obtain labs today and call you with the results (LP/BMET/HFP/CBC)  INCREASE YOUR CARVEDILOL TO 12.5 MG TWICE A DAY, RX SENT TO [facility]  Your physician wants you to follow-up in: 4 months with fasting labs (lp/bmet/hfp) AND EKG You will receive a reminder letter in the mail two months in advance. If you don't receive a letter, please call our office to schedule the follow-up appointment.

## 2012-12-29 NOTE — Progress Notes (Signed)
Quick Note:  Please report to patient. The recent labs are stable. Continue same medication and careful diet. ______ 

## 2012-12-29 NOTE — Assessment & Plan Note (Signed)
The patient is on Crestor for her dyslipidemia.  We're checking lab work today.  She is not having any myalgias from the Crestor

## 2012-12-30 ENCOUNTER — Telehealth: Payer: Self-pay | Admitting: *Deleted

## 2012-12-30 NOTE — Telephone Encounter (Signed)
Message copied by Burnell Blanks on Thu Dec 30, 2012 10:18 AM ------      Message from: [name]      Created: Wed Dec 29, 2012 10:10 PM       Please report to patient.  The recent labs are stable. Continue same medication and careful diet. ------

## 2012-12-30 NOTE — Telephone Encounter (Signed)
No answer mailed copy and highlighted  Dr. Yevonne Pax comments

## 2012-12-31 ENCOUNTER — Ambulatory Visit: Payer: Medicare Other | Admitting: Family

## 2013-01-04 ENCOUNTER — Telehealth: Payer: Self-pay | Admitting: Cardiology

## 2013-01-04 NOTE — Telephone Encounter (Signed)
Rx Refill

## 2013-02-03 ENCOUNTER — Other Ambulatory Visit: Payer: Self-pay | Admitting: Surgical

## 2013-02-04 ENCOUNTER — Ambulatory Visit (INDEPENDENT_AMBULATORY_CARE_PROVIDER_SITE_OTHER): Payer: Medicare Other | Admitting: *Deleted

## 2013-02-04 DIAGNOSIS — I4891 Unspecified atrial fibrillation: Secondary | ICD-10-CM

## 2013-02-04 DIAGNOSIS — I359 Nonrheumatic aortic valve disorder, unspecified: Secondary | ICD-10-CM

## 2013-02-04 DIAGNOSIS — I352 Nonrheumatic aortic (valve) stenosis with insufficiency: Secondary | ICD-10-CM

## 2013-02-04 DIAGNOSIS — Z954 Presence of other heart-valve replacement: Secondary | ICD-10-CM

## 2013-03-09 ENCOUNTER — Ambulatory Visit (INDEPENDENT_AMBULATORY_CARE_PROVIDER_SITE_OTHER): Payer: Medicare Other | Admitting: *Deleted

## 2013-03-09 DIAGNOSIS — I5043 Acute on chronic combined systolic (congestive) and diastolic (congestive) heart failure: Secondary | ICD-10-CM

## 2013-03-09 DIAGNOSIS — I4891 Unspecified atrial fibrillation: Secondary | ICD-10-CM

## 2013-03-09 DIAGNOSIS — Z954 Presence of other heart-valve replacement: Secondary | ICD-10-CM

## 2013-03-09 DIAGNOSIS — I359 Nonrheumatic aortic valve disorder, unspecified: Secondary | ICD-10-CM

## 2013-03-09 DIAGNOSIS — I352 Nonrheumatic aortic (valve) stenosis with insufficiency: Secondary | ICD-10-CM

## 2013-03-09 DIAGNOSIS — I469 Cardiac arrest, cause unspecified: Secondary | ICD-10-CM

## 2013-03-09 LAB — ICD DEVICE OBSERVATION
CHARGE TIME: 9.909 s
DEV-0020ICD: NEGATIVE
RV LEAD AMPLITUDE: 10.3168 mv
RV LEAD IMPEDENCE ICD: 808 Ohm
RV LEAD THRESHOLD: 1 V
TOT-0001: 0
TOT-0002: 0
TOT-0006: 20091020000000
TZAT-0001FASTVT: 1
TZAT-0004SLOWVT: 8
TZAT-0004SLOWVT: 8
TZAT-0005SLOWVT: 81 pct
TZAT-0005SLOWVT: 91 pct
TZAT-0012SLOWVT: 200 ms
TZAT-0012SLOWVT: 200 ms
TZAT-0013SLOWVT: 4
TZAT-0013SLOWVT: 4
TZAT-0018FASTVT: NEGATIVE
TZAT-0018SLOWVT: NEGATIVE
TZON-0003SLOWVT: 330 ms
TZON-0004SLOWVT: 32
TZON-0004VSLOWVT: 20
TZON-0005SLOWVT: 12
TZST-0001FASTVT: 2
TZST-0001FASTVT: 3
TZST-0001FASTVT: 4
TZST-0001FASTVT: 6
TZST-0001SLOWVT: 4
TZST-0001SLOWVT: 6
TZST-0002FASTVT: NEGATIVE
TZST-0003SLOWVT: 35 J
TZST-0003SLOWVT: 35 J

## 2013-03-09 NOTE — Progress Notes (Signed)
PPM check in office. 

## 2013-03-10 ENCOUNTER — Other Ambulatory Visit: Payer: Self-pay | Admitting: Family

## 2013-03-14 ENCOUNTER — Other Ambulatory Visit: Payer: Self-pay | Admitting: Surgical

## 2013-03-14 ENCOUNTER — Other Ambulatory Visit: Payer: Self-pay | Admitting: Family

## 2013-03-30 ENCOUNTER — Other Ambulatory Visit: Payer: Self-pay | Admitting: Surgical

## 2013-03-31 ENCOUNTER — Encounter: Payer: Self-pay | Admitting: Internal Medicine

## 2013-04-12 ENCOUNTER — Other Ambulatory Visit: Payer: Self-pay | Admitting: Cardiology

## 2013-04-13 ENCOUNTER — Ambulatory Visit (INDEPENDENT_AMBULATORY_CARE_PROVIDER_SITE_OTHER): Payer: Medicare Other | Admitting: *Deleted

## 2013-04-13 DIAGNOSIS — Z954 Presence of other heart-valve replacement: Secondary | ICD-10-CM

## 2013-04-13 DIAGNOSIS — I359 Nonrheumatic aortic valve disorder, unspecified: Secondary | ICD-10-CM

## 2013-04-13 DIAGNOSIS — I352 Nonrheumatic aortic (valve) stenosis with insufficiency: Secondary | ICD-10-CM

## 2013-04-13 DIAGNOSIS — I4891 Unspecified atrial fibrillation: Secondary | ICD-10-CM

## 2013-04-13 MED ORDER — WARFARIN SODIUM 5 MG PO TABS: 5.0000 mg | ORAL_TABLET | Freq: Every evening | ORAL | Status: DC

## 2013-04-28 IMAGING — CR DG CHEST 1V PORT
1 series · 1 of 1 positions shown · non-contrast
Comparison: CT chest 04/09/2012 and chest radiograph 04/06/2012.

CLINICAL DATA: Postop aortic valve replacement.

PORTABLE CHEST - 1 VIEW

[AP]
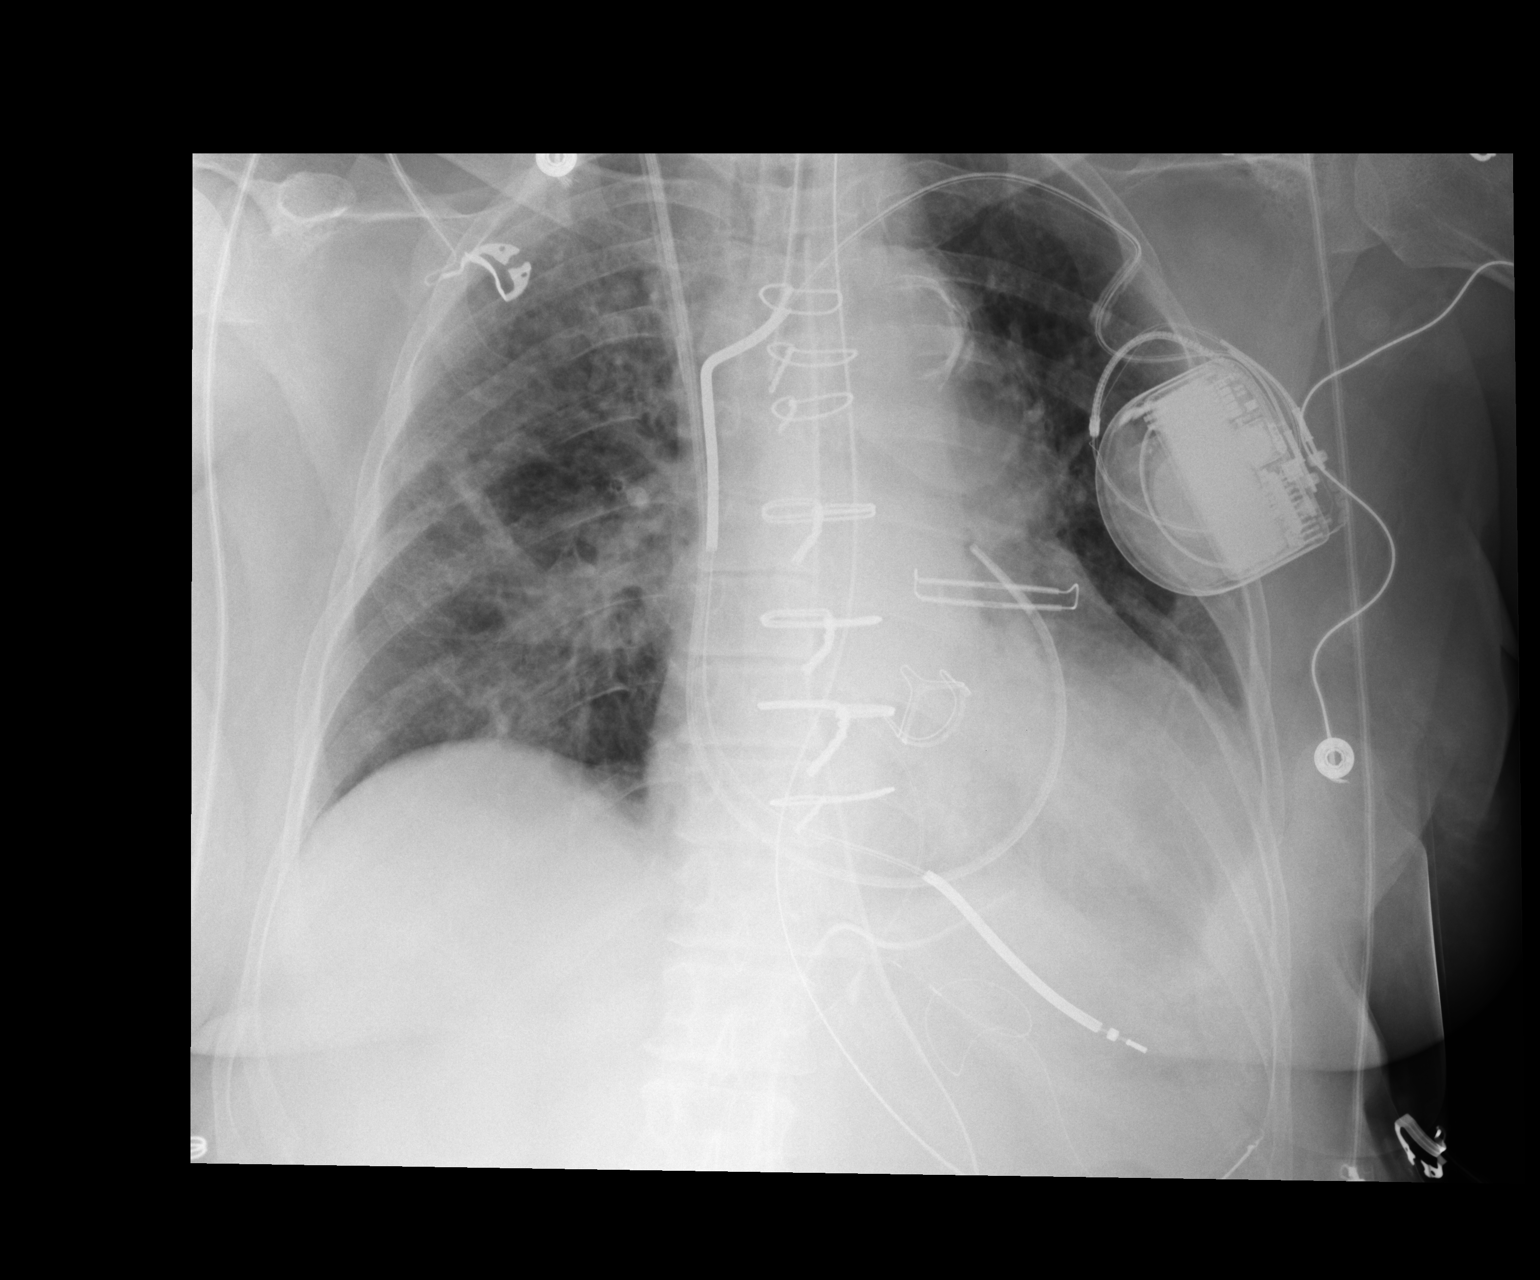

[1 of 1 positions shown; findings below may reference images not displayed]

FINDINGS: Endotracheal tube terminates approximately 1.1 cm above
the carina.  Nasogastric tube is followed into the stomach.  Right
IJ Swan-Ganz catheter tip projects over the proximal right
pulmonary artery.  Mediastinal drains are in place.  Seven intact
sternotomy wires.  Epicardial pacer wires are noted.  ICD lead tip
is stable in position.

Heart is enlarged, stable.  Thoracic aorta is calcified.  Lungs are
somewhat low in volume with mild interstitial prominence and
indistinctness.  Hilar regions are prominent, which may be due to
postoperative state.  Small left pleural effusion.  No definite
pneumothorax.
IMPRESSION: 1.  Endotracheal tube is somewhat low lying.  Pulling back 2-3 cm
would better position the tip above the carina.
2.  Low lung volumes with vascular crowding versus mild edema.
3.  Small left pleural effusion.

## 2013-05-02 ENCOUNTER — Encounter: Payer: Self-pay | Admitting: Cardiology

## 2013-05-02 ENCOUNTER — Ambulatory Visit (INDEPENDENT_AMBULATORY_CARE_PROVIDER_SITE_OTHER): Payer: Medicare Other | Admitting: Cardiology

## 2013-05-02 ENCOUNTER — Ambulatory Visit (INDEPENDENT_AMBULATORY_CARE_PROVIDER_SITE_OTHER): Payer: Medicare Other | Admitting: *Deleted

## 2013-05-02 VITALS — HR 75 | Ht 62.0 in | Wt 188.8 lb

## 2013-05-02 DIAGNOSIS — I10 Essential (primary) hypertension: Secondary | ICD-10-CM

## 2013-05-02 DIAGNOSIS — Z95810 Presence of automatic (implantable) cardiac defibrillator: Secondary | ICD-10-CM

## 2013-05-02 DIAGNOSIS — I4891 Unspecified atrial fibrillation: Secondary | ICD-10-CM

## 2013-05-02 DIAGNOSIS — Z954 Presence of other heart-valve replacement: Secondary | ICD-10-CM

## 2013-05-02 DIAGNOSIS — I5043 Acute on chronic combined systolic (congestive) and diastolic (congestive) heart failure: Secondary | ICD-10-CM

## 2013-05-02 DIAGNOSIS — Z952 Presence of prosthetic heart valve: Secondary | ICD-10-CM

## 2013-05-02 DIAGNOSIS — I352 Nonrheumatic aortic (valve) stenosis with insufficiency: Secondary | ICD-10-CM

## 2013-05-02 DIAGNOSIS — I359 Nonrheumatic aortic valve disorder, unspecified: Secondary | ICD-10-CM

## 2013-05-02 DIAGNOSIS — Z953 Presence of xenogenic heart valve: Secondary | ICD-10-CM

## 2013-05-02 LAB — POCT INR: INR: 2.1

## 2013-05-02 IMAGING — CR DG CHEST 1V PORT
1 series · 1 of 1 positions shown · non-contrast
Comparison: Portable exam 8788 hours compared to 04/15/2012

CLINICAL DATA: Post AVR and thoracic ascending aortic aneurysm
repair

PORTABLE CHEST - 1 VIEW

[AP]
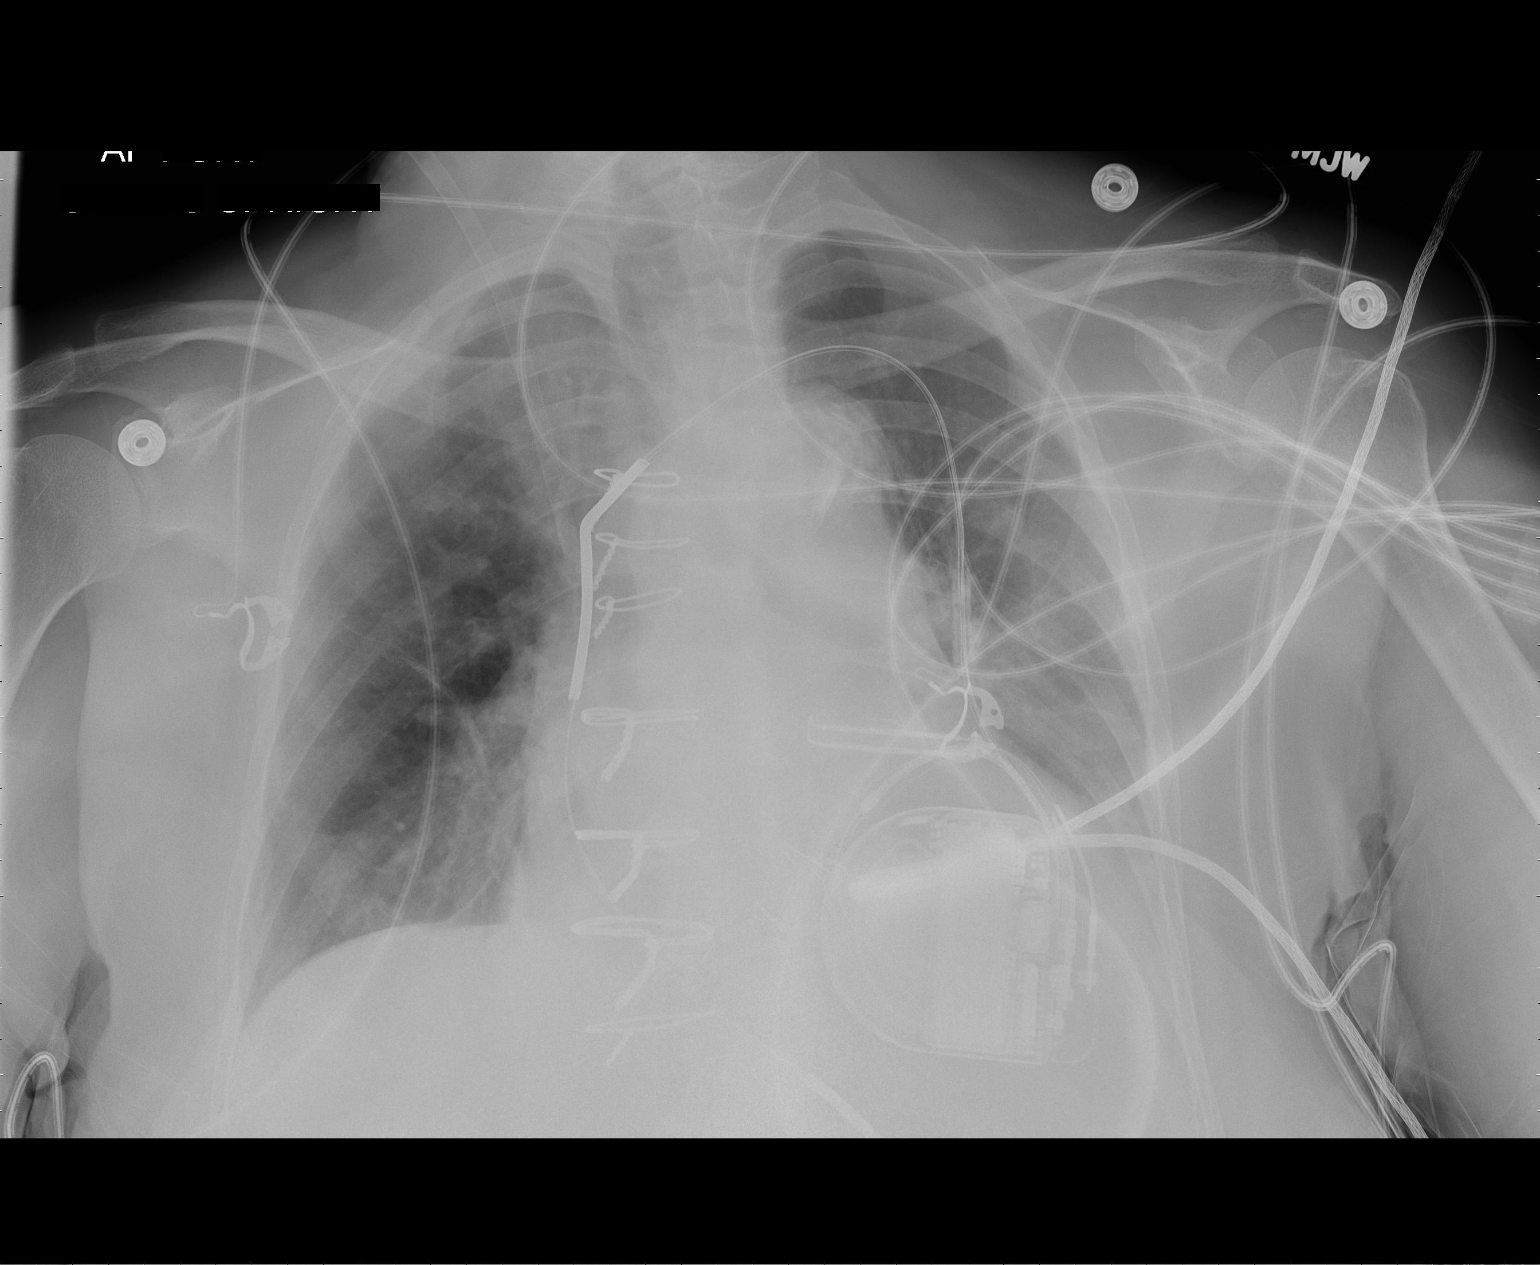

[1 of 1 positions shown; findings below may reference images not displayed]

FINDINGS: Right jugular catheter with tip projecting over SVC.
Left subclavian AICD lead projects over right ventricle.
Enlargement of cardiac silhouette post median sternotomy and AVR.
Numerous cardiac monitoring lines project over chest.
Left basilar atelectasis versus consolidation.
Minimal right base atelectasis.
Remaining lungs clear.
Dense atherosclerotic calcification aorta.
No pneumothorax.
IMPRESSION: No interval change.

## 2013-05-02 IMAGING — CR DG CHEST 1V PORT
1 series · 1 of 1 positions shown · non-contrast
Comparison: Portable exam 1022 hours compared to 04/16/2012

CLINICAL DATA: Line placement

PORTABLE CHEST - 1 VIEW

[AP]
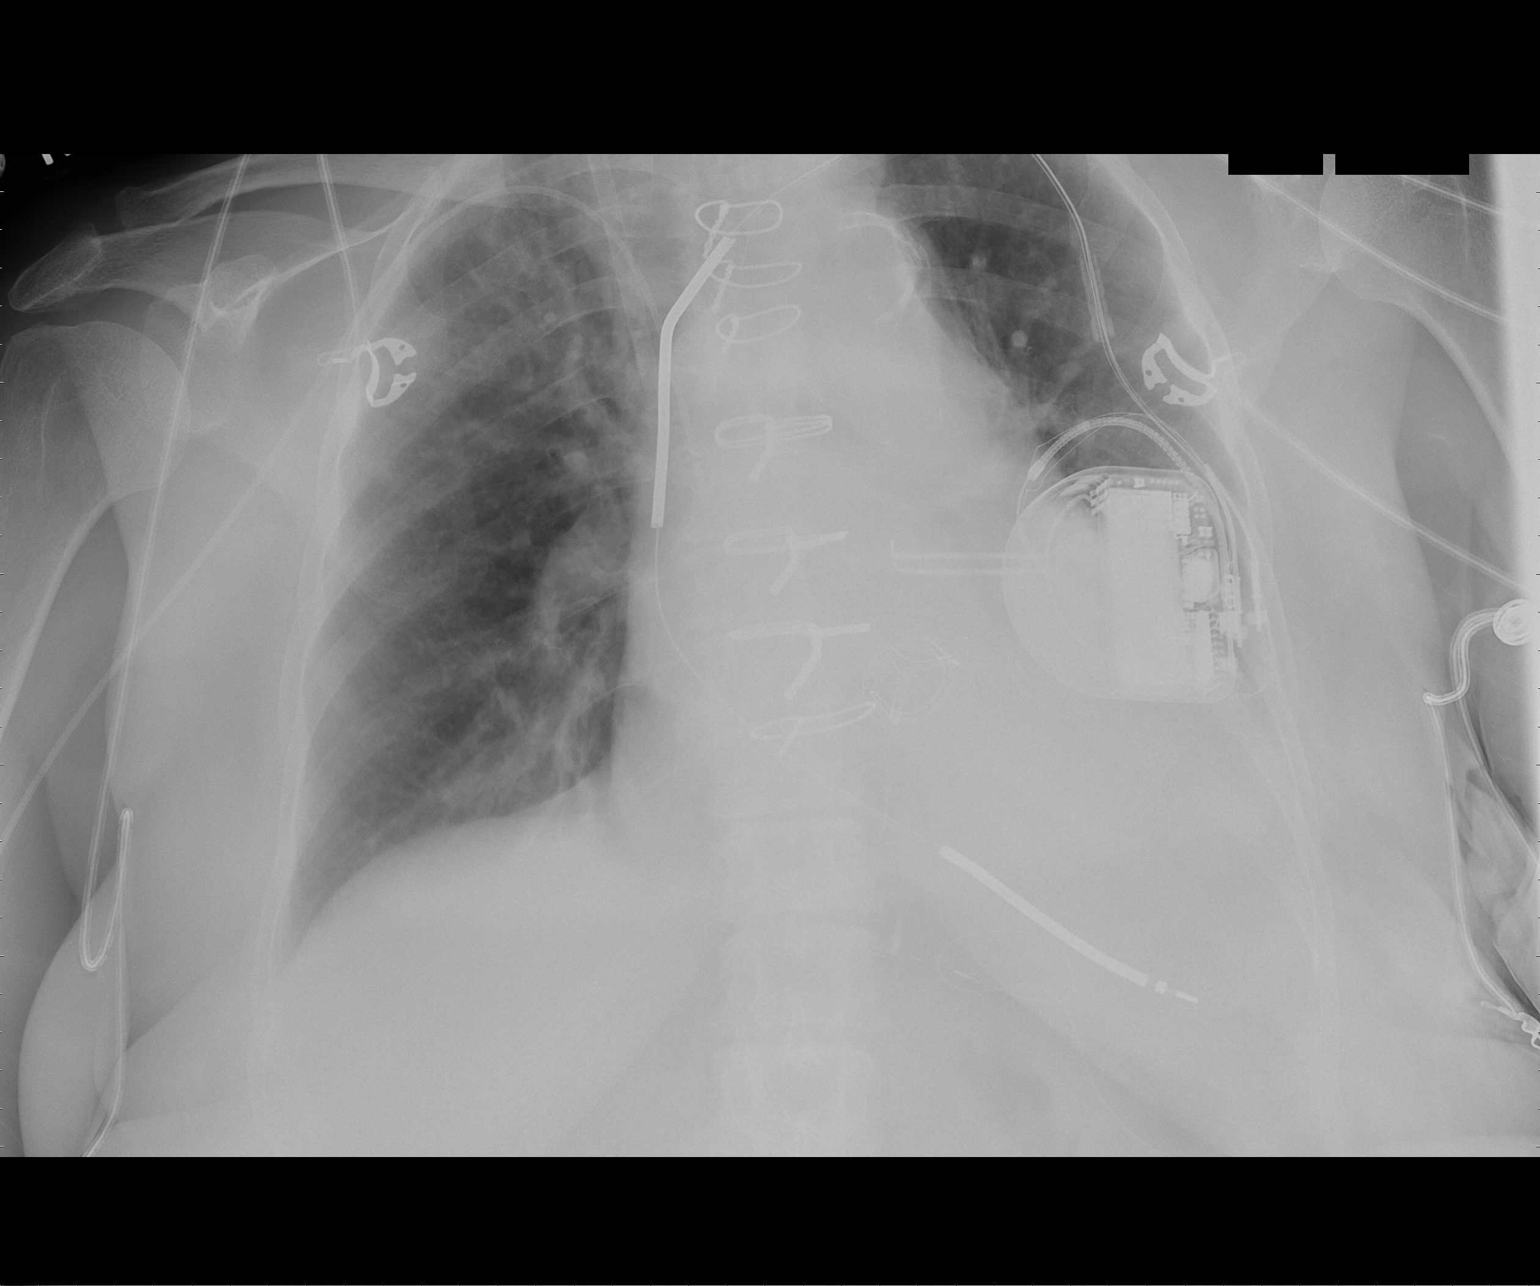

[1 of 1 positions shown; findings below may reference images not displayed]

FINDINGS: Right jugular central venous catheter with tip projecting over SVC.
New right arm PICC line with tip projecting over SVC.
Left subclavian AICD with lead projecting over right ventricle.
Epicardial pacing wires noted.
Enlargement of cardiac silhouette post median sternotomy and AVR.
Left atrial appendage occlusion clamp present.
Atherosclerotic calcification aorta.
Pulmonary vascular congestion.
Atelectasis versus infiltrate right base.
Persistent dense atelectasis versus consolidation in left lower
lobe.
No pneumothorax.
IMPRESSION: Tip of right arm PICC line projects over SVC.
Increased atelectasis versus consolidation in left lower lobe and
much less at right base.

## 2013-05-02 MED ORDER — FUROSEMIDE 40 MG PO TABS: 40.0000 mg | ORAL_TABLET | Freq: Every day | ORAL | Status: DC

## 2013-05-02 NOTE — Assessment & Plan Note (Signed)
She is not having any symptoms of congestive heart failure.  Exercise tolerance is improved.  No paroxysmal nocturnal dyspnea.  No peripheral edema and she remains on just 40 mg of Lasix daily

## 2013-05-02 NOTE — Assessment & Plan Note (Signed)
She has chronic established permanent atrial fibrillation.  She is on warfarin.  She has not had any TIA symptoms

## 2013-05-02 NOTE — Assessment & Plan Note (Signed)
She has had no shocks from her defibrillator

## 2013-05-02 NOTE — Progress Notes (Signed)
[name] Date of Birth:  1941/05/16 [facility] [address] [place], [place]  [address] [phone]         Fax   [phone]  History of Present Illness: This pleasant 72 year old woman is seen for a post hospital office visit. 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 [name]. 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 She also has a dilated left ventricle with marked decrease in left ventricular systolic function. She is now on carvedilol 6.25 mg by mouth twice a day. Her symptoms of congestive heart failure have improved significantly since surgery. She is no longer having any ankle edema or significant orthopnea. Her weight is up 8 pounds since last visit.  She has a good appetite.  She now lives with her daughter at the [place] [facility]. She remains in chronic atrial fibrillation and is now on Coumadin. Clinically she is much improved since her surgery. .   Current Outpatient Prescriptions  Medication Sig Dispense Refill  . allopurinol (ZYLOPRIM) 100 MG tablet Take 100 mg by mouth 2 (two) times daily.      [name] aspirin 81 MG EC tablet Take 1 tablet (81 mg total) by mouth daily.  100 tablet  prn  . Biotin 5000 MCG CAPS Take 1 capsule (5,000 mcg total) by mouth daily.  30 capsule  6  . carvedilol (COREG) 12.5 MG tablet Take 1 tablet (12.5 mg total) by mouth 2 (two) times daily with a meal.  60 tablet  5  . KLOR-CON M20 20 MEQ tablet TAKE 1 TABLET BY MOUTH EVERY DAY  30 tablet  2  . NITROSTAT 0.4 MG SL tablet Place 0.4 mg under the tongue every 5 (five) minutes as needed. For chest pain      . oxyCODONE (OXY IR/ROXICODONE) 5 MG immediate release tablet Take 1 tablet (5 mg total) by mouth every 6 (six) hours as needed. For pain  30 tablet  0  . potassium chloride SA (K-DUR,KLOR-CON) 20 MEQ tablet Take 1 tablet (20 mEq total) by mouth daily.  30 tablet  5   . rosuvastatin (CRESTOR) 5 MG tablet Take 1 tablet (5 mg total) by mouth daily.  30 tablet  2  . triamcinolone cream (KENALOG) 0.1 % Apply topically 2 (two) times daily.  60 g  0  . warfarin (COUMADIN) 5 MG tablet Take 1 tablet (5 mg total) by mouth every evening.  30 tablet  3  . furosemide (LASIX) 40 MG tablet Take 1 tablet (40 mg total) by mouth daily.  30 tablet  2   No current facility-administered medications for this visit.    Allergies  Allergen Reactions  . Adhesive (Tape) Other (See Comments)    Redness and skin breaks down  . Latex Rash  . Neomycin-Bacitracin Zn-Polymyx Rash    Patient Active Problem List   Diagnosis Date Noted  . Aortic insufficiency and aortic stenosis 01/01/2011    Priority: High  . Atrial fibrillation 01/02/2009    Priority: High  . Frozen shoulder 12/01/2012  . Otalgia of left ear 10/29/2012  . Rash 09/04/2012  . Acute pyelonephritis 08/08/2012  . Fever 08/08/2012  . Weakness generalized 08/08/2012  . Annual physical exam 07/08/2012  . Heart valve replaced by other means 04/27/2012  . Hypokalemia 04/11/2012  . Atherosclerosis of native arteries of the extremities with intermittent claudication 03/03/2012  . Anemia 02/17/2012  .  Lower limb ischemia 02/14/2012  . Acute on chronic systolic congestive heart failure, NYHA class 3 02/14/2012  . Acute on chronic kidney disease, stage 3 02/14/2012  . Cardiac arrest 11/04/2011  . Implantable cardiac defibrillator single-chamber-Medtronic 11/04/2011  . Alopecia 11/04/2011  . Acute on chronic renal insufficiency 07/14/2011  . ALKALINE PHOSPHATASE, ELEVATED 12/12/2010  . COMBINED HEART FAILURE, ACUTE ON CHRONIC 11/25/2010  . BACK PAIN, LUMBAR 12/19/2009  . MEMORY LOSS 09/05/2009  . HYPERKALEMIA 01/19/2009  . VENOUS INSUFFICIENCY 01/19/2009  . HYPERLIPIDEMIA 01/02/2009  . GOUT 01/02/2009  . HYPERTENSION 01/02/2009    History  Smoking status  . Never Smoker   Smokeless tobacco  . Never Used      History  Alcohol Use No    Family History  Problem Relation Age of Onset  . Hypertension Father   . Cancer Father   . Coronary artery disease Father 21    Review of Systems: Constitutional: no fever chills diaphoresis or fatigue or change in weight.  Head and neck: no hearing loss, no epistaxis, no photophobia or visual disturbance. Respiratory: No cough, shortness of breath or wheezing. Cardiovascular: No chest pain peripheral edema, palpitations. Gastrointestinal: No abdominal distention, no abdominal pain, no change in bowel habits hematochezia or melena. Genitourinary: No dysuria, no frequency, no urgency, no nocturia. Musculoskeletal:No arthralgias, no back pain, no gait disturbance or myalgias. Neurological: No dizziness, no headaches, no numbness, no seizures, no syncope, no weakness, no tremors. Hematologic: No lymphadenopathy, no easy bruising. Psychiatric: No confusion, no hallucinations, no sleep disturbance.    Physical Exam: Filed Vitals:   05/02/13 1332  Pulse: 75   the general appearance reveals a well-developed well-nourished elderly woman in no distress.The head and neck exam reveals pupils equal and reactive.  Extraocular movements are full.  There is no scleral icterus.  The mouth and pharynx are normal.  The neck is supple.  The carotids reveal no bruits.  The jugular venous pressure is normal.  The  thyroid is not enlarged.  There is no lymphadenopathy.  The chest is clear to percussion and auscultation.  There are no rales or rhonchi.  Expansion of the chest is symmetrical.  The pulse is irregularly irregular.  The ICD is in the left upper anterior chest. The precordium is quiet.  The first heart sound is normal.  The second heart sound is physiologically split.  There is a soft systolic flow murmur across the prosthetic aortic valve.  No diastolic murmur.  There is no abnormal lift or heave.  The abdomen is soft and nontender.  The bowel sounds are normal.   The liver and spleen are not enlarged.  There are no abdominal masses.  There are no abdominal bruits.  Extremities reveal good pedal pulses.  There is no phlebitis or edema.  There is no cyanosis or clubbing.  Strength is normal and symmetrical in all extremities.  There is no lateralizing weakness.  There are no sensory deficits.  The skin is warm and dry.  There is no rash.  EKG shows atrial fibrillation with left anterior fascicular block and lateral T-wave changes.  Since 08/08/12, no significant change  Assessment / Plan: Continue on same medication.  Needs to walk more for exercise.  Recheck in 4 months.

## 2013-05-02 NOTE — Patient Instructions (Signed)
Your physician discussed the importance of regular exercise and recommended that you start or continue a regular exercise program for good health.  Your physician wants you to follow-up in: 4 months You will receive a reminder letter in the mail two months in advance. If you don't receive a letter, please call our office to schedule the follow-up appointment.\

## 2013-05-02 NOTE — Assessment & Plan Note (Signed)
She would not let us take her blood pressure today.  She stated that the arm cuff is too painful for her.  She would not allow [place] to take her blood pressure above the wrist either. She feels like her blood pressure is okay.  She denies any headaches or dizzy spells.

## 2013-05-10 ENCOUNTER — Other Ambulatory Visit: Payer: Self-pay | Admitting: Family

## 2013-05-10 ENCOUNTER — Other Ambulatory Visit: Payer: Self-pay | Admitting: Cardiology

## 2013-05-31 ENCOUNTER — Ambulatory Visit (INDEPENDENT_AMBULATORY_CARE_PROVIDER_SITE_OTHER): Payer: Medicare Other | Admitting: *Deleted

## 2013-05-31 DIAGNOSIS — I4891 Unspecified atrial fibrillation: Secondary | ICD-10-CM

## 2013-05-31 DIAGNOSIS — I352 Nonrheumatic aortic (valve) stenosis with insufficiency: Secondary | ICD-10-CM

## 2013-05-31 DIAGNOSIS — I359 Nonrheumatic aortic valve disorder, unspecified: Secondary | ICD-10-CM

## 2013-05-31 DIAGNOSIS — Z954 Presence of other heart-valve replacement: Secondary | ICD-10-CM

## 2013-05-31 LAB — POCT INR: INR: 1.8

## 2013-06-09 ENCOUNTER — Ambulatory Visit (INDEPENDENT_AMBULATORY_CARE_PROVIDER_SITE_OTHER): Payer: Medicare Other | Admitting: *Deleted

## 2013-06-09 DIAGNOSIS — I469 Cardiac arrest, cause unspecified: Secondary | ICD-10-CM

## 2013-06-09 DIAGNOSIS — I509 Heart failure, unspecified: Secondary | ICD-10-CM

## 2013-06-09 DIAGNOSIS — I5023 Acute on chronic systolic (congestive) heart failure: Secondary | ICD-10-CM

## 2013-06-09 LAB — ICD DEVICE OBSERVATION
DEV-0020ICD: NEGATIVE
FVT: 0
PACEART VT: 0
TOT-0001: 0
TOT-0002: 0
TOT-0006: 20091020000000
TZAT-0001SLOWVT: 1
TZAT-0001SLOWVT: 2
TZAT-0002FASTVT: NEGATIVE
TZAT-0004SLOWVT: 8
TZAT-0004SLOWVT: 8
TZAT-0005SLOWVT: 81 pct
TZAT-0005SLOWVT: 91 pct
TZAT-0011SLOWVT: 10 ms
TZAT-0011SLOWVT: 10 ms
TZAT-0013SLOWVT: 4
TZAT-0018FASTVT: NEGATIVE
TZAT-0019FASTVT: 8 V
TZAT-0020FASTVT: 1.5 ms
TZON-0004SLOWVT: 32
TZON-0004VSLOWVT: 20
TZST-0001FASTVT: 2
TZST-0001FASTVT: 3
TZST-0001FASTVT: 6
TZST-0001SLOWVT: 3
TZST-0001SLOWVT: 6
TZST-0002FASTVT: NEGATIVE
TZST-0002FASTVT: NEGATIVE
TZST-0002FASTVT: NEGATIVE
TZST-0003SLOWVT: 35 J
VENTRICULAR PACING ICD: 4.6 pct

## 2013-06-09 NOTE — Progress Notes (Signed)
ICD check in office. 

## 2013-06-21 ENCOUNTER — Ambulatory Visit (INDEPENDENT_AMBULATORY_CARE_PROVIDER_SITE_OTHER): Payer: Medicare Other | Admitting: *Deleted

## 2013-06-21 DIAGNOSIS — I359 Nonrheumatic aortic valve disorder, unspecified: Secondary | ICD-10-CM

## 2013-06-21 DIAGNOSIS — I4891 Unspecified atrial fibrillation: Secondary | ICD-10-CM

## 2013-06-21 DIAGNOSIS — I352 Nonrheumatic aortic (valve) stenosis with insufficiency: Secondary | ICD-10-CM

## 2013-06-21 DIAGNOSIS — Z23 Encounter for immunization: Secondary | ICD-10-CM

## 2013-06-21 DIAGNOSIS — Z954 Presence of other heart-valve replacement: Secondary | ICD-10-CM

## 2013-06-21 LAB — POCT INR: INR: 2.2

## 2013-07-04 ENCOUNTER — Other Ambulatory Visit: Payer: Self-pay | Admitting: Cardiology

## 2013-07-07 ENCOUNTER — Encounter: Payer: Self-pay | Admitting: Internal Medicine

## 2013-07-11 ENCOUNTER — Ambulatory Visit (INDEPENDENT_AMBULATORY_CARE_PROVIDER_SITE_OTHER): Payer: Medicare Other | Admitting: Family

## 2013-07-11 ENCOUNTER — Encounter: Payer: Self-pay | Admitting: Family

## 2013-07-11 VITALS — BP 120/82 | HR 67 | Temp 97.8°F | Resp 16 | Ht 62.0 in | Wt 191.1 lb

## 2013-07-11 DIAGNOSIS — I5023 Acute on chronic systolic (congestive) heart failure: Secondary | ICD-10-CM

## 2013-07-11 DIAGNOSIS — Z0000 Encounter for general adult medical examination without abnormal findings: Secondary | ICD-10-CM

## 2013-07-11 DIAGNOSIS — I10 Essential (primary) hypertension: Secondary | ICD-10-CM

## 2013-07-11 DIAGNOSIS — Z1231 Encounter for screening mammogram for malignant neoplasm of breast: Secondary | ICD-10-CM

## 2013-07-11 DIAGNOSIS — Z13820 Encounter for screening for osteoporosis: Secondary | ICD-10-CM

## 2013-07-11 DIAGNOSIS — I5043 Acute on chronic combined systolic (congestive) and diastolic (congestive) heart failure: Secondary | ICD-10-CM

## 2013-07-11 DIAGNOSIS — I4891 Unspecified atrial fibrillation: Secondary | ICD-10-CM

## 2013-07-11 DIAGNOSIS — K5900 Constipation, unspecified: Secondary | ICD-10-CM

## 2013-07-11 DIAGNOSIS — Z5181 Encounter for therapeutic drug level monitoring: Secondary | ICD-10-CM

## 2013-07-11 DIAGNOSIS — N179 Acute kidney failure, unspecified: Secondary | ICD-10-CM

## 2013-07-11 DIAGNOSIS — R413 Other amnesia: Secondary | ICD-10-CM

## 2013-07-11 DIAGNOSIS — K5909 Other constipation: Secondary | ICD-10-CM | POA: Insufficient documentation

## 2013-07-11 DIAGNOSIS — E785 Hyperlipidemia, unspecified: Secondary | ICD-10-CM

## 2013-07-11 DIAGNOSIS — I509 Heart failure, unspecified: Secondary | ICD-10-CM

## 2013-07-11 DIAGNOSIS — I129 Hypertensive chronic kidney disease with stage 1 through stage 4 chronic kidney disease, or unspecified chronic kidney disease: Secondary | ICD-10-CM

## 2013-07-11 DIAGNOSIS — E2839 Other primary ovarian failure: Secondary | ICD-10-CM

## 2013-07-11 LAB — CBC WITH DIFFERENTIAL/PLATELET
Eosinophils Relative: 5 % (ref 0–5)
HCT: 43.8 % (ref 36.0–46.0)
Hemoglobin: 15 g/dL (ref 12.0–15.0)
Lymphocytes Relative: 30 % (ref 12–46)
Lymphs Abs: 1.7 10*3/uL (ref 0.7–4.0)
MCV: 90.7 fL (ref 78.0–100.0)
Monocytes Absolute: 0.7 10*3/uL (ref 0.1–1.0)
RBC: 4.83 MIL/uL (ref 3.87–5.11)
RDW: 14.2 % (ref 11.5–15.5)
WBC: 5.7 10*3/uL (ref 4.0–10.5)

## 2013-07-11 LAB — LIPID PANEL
HDL: 73 mg/dL (ref >39)
LDL Cholesterol: 83 mg/dL (ref 0–99)
Total CHOL/HDL Ratio: 2.5 Ratio

## 2013-07-11 LAB — BASIC METABOLIC PANEL WITH GFR
BUN: 24 mg/dL — ABNORMAL HIGH (ref 6–23)
CO2: 32 mEq/L (ref 19–32)
Chloride: 103 mEq/L (ref 96–112)
Creat: 1.67 mg/dL — ABNORMAL HIGH (ref 0.50–1.10)
GFR, Est African American: 35 mL/min — ABNORMAL LOW
Sodium: 142 mEq/L (ref 135–145)

## 2013-07-11 LAB — HEPATIC FUNCTION PANEL
AST: 18 U/L (ref 0–37)
Bilirubin, Direct: 0.1 mg/dL (ref 0.0–0.3)
Indirect Bilirubin: 0.5 mg/dL (ref 0.0–0.9)
Total Bilirubin: 0.6 mg/dL (ref 0.3–1.2)

## 2013-07-11 MED ORDER — ZOSTER VACCINE LIVE 19400 UNT/0.65ML ~~LOC~~ SOLR: 0.6500 mL | Freq: Once | SUBCUTANEOUS | Status: DC

## 2013-07-11 MED ORDER — HYDROCOD POLST-CHLORPHEN POLST 10-8 MG/5ML PO LQCR: 5.0000 mL | Freq: Two times a day (BID) | ORAL | Status: DC | PRN

## 2013-07-11 NOTE — Assessment & Plan Note (Signed)
Continues coumadin (followed by coumadin clinic).  Continue coreg. Rate is stable.

## 2013-07-11 NOTE — Progress Notes (Signed)
Subjective:    Patient ID: [name], female    DOB: 06/26/1941, 72 y.o.   MRN: [id number]  HPI  [name] is a 72 yr old female who presents today for medicare welllness exam.  Subjective:   Patient here for Medicare annual wellness visit and management of other chronic and acute problems.  CHF- pt is up 7 pounds since her last visit with Dr. Patty Sermons in August. She has in implantable defibrillator.  She reports increased LE edema.  Reports that when she takes the second one only if she is swelling.  AF- She is on coumadin.  She is on coreg.  CRI- Most recent creatinine was 1.3.  Hyperlipidemia- maintained on crestor 5mg .   Hypertension- maintained on coreg  She continues to be concerned about memory loss.  She thinks that her memory is getting worse and worse.    Pt here for fasting medicare wellness exam.  Up to date with tetanus and flu vaccine. Would like shingles vaccine. Up to date with mammogram. Doesn't remember last colonoscopy- thinks that her last colonscopy was >10 yrs ago.   Pt unsure of last DEXA .  Previous EKG in [facility] 04/2013.  Declines colo or ifob.    She reports that would like a refill on cough medication to have on hand.  Tussionex.    Reports bad constipation.  Has tried miralax.  Reports that the onl thing that works is the pink dulcolax.  She has to take one a night.  THen will have a BM the following day.  Reports that she develops some low ack pain   Declines pap smear.    Risk factors: Heart disease  Roster of Physicians Providing Medical Care to Patient: Dr. Patty Sermons- cardiology  Activities of Daily Living  In your present state of health, do you have any difficulty performing the following activities? Preparing food and eating?: No  Bathing yourself: No  Getting dressed: No  Using the toilet:No  Moving around from place to place: No  In the past year have you fallen or had a near fall?:No    Home Safety: Has smoke detector and wears  seat belts. No firearms. No excess sun exposure.  Diet and Exercise  Current exercise habits:  Dietary issues discussed: healthy diet   Depression Screen  (Note: if answer to either of the following is "Yes", then a more complete depression screening is indicated)  Q1: Over the past two weeks, have you felt down, depressed or hopeless?no  Q2: Over the past two weeks, have you felt little interest or pleasure in doing things? no   The following portions of the patient's history were reviewed and updated as appropriate: allergies, current medications, past family history, past medical history, past social history, past surgical history and problem list.   Objective:   Vision: see nursing Hearing: failed forced whisper at 6 feet.  Declines hearing referral Body mass index: Cognitive Impairment Assessment: cognition, memory and judgment appear normal.   Assessment:   Medicare wellness- obtain dexa scan. Rx provided for zostavax- she will have administered at pharmacy. Declines mammo and pap.  Obtain fasting labs.  Plan:    Patient Instructions (the written plan) was given to the patient.     Review of Systems  Respiratory: Negative for shortness of breath.   Cardiovascular: Negative for chest pain.  Gastrointestinal: Positive for constipation.  Musculoskeletal:       Occasional left shoulder soreness       Objective:  Physical Exam  Constitutional: She is oriented to person, place, and time. She appears well-developed and well-nourished. No distress.  HENT:  Head: Normocephalic and atraumatic.  Cardiovascular: Normal rate and regular rhythm.   No murmur heard. Pulmonary/Chest: Effort normal. No respiratory distress. She has no wheezes. She has rales in the left lower field. She exhibits no tenderness.  Few bibasilar crackles.    Musculoskeletal:  2+  Bilateral LE edema.   Neurological: She is alert and oriented to person, place, and time.  Psychiatric: She has a normal  mood and affect. Her behavior is normal. Judgment and thought content normal.          Assessment & Plan:

## 2013-07-11 NOTE — Assessment & Plan Note (Signed)
Continue crestor.  Obtain flp/lft.

## 2013-07-11 NOTE — Patient Instructions (Addendum)
Please start using metamucil wafers 2 wafers once a day. Please complete lab work prior to leaving. Increase furosemide to 40mg  twice daily. You will be contacted about your bone density and mammogram. Follow up in 2 weeks.

## 2013-07-11 NOTE — Assessment & Plan Note (Signed)
Recommended that she start metamucil wafers.

## 2013-07-11 NOTE — Assessment & Plan Note (Signed)
BP Readings from Last 3 Encounters:  07/11/13 120/82  12/29/12 140/72  12/01/12 103/60    BP is stable. Continue coreg.

## 2013-07-11 NOTE — Assessment & Plan Note (Signed)
She is concerned about memory loss.  Will have her return for MMSE.

## 2013-07-11 NOTE — Assessment & Plan Note (Signed)
+   edema, mild crackles at bases, + weight gain since last visit. Will increase furosemide to 40mg  bid.

## 2013-07-11 NOTE — Assessment & Plan Note (Signed)
Obtain BMET. 

## 2013-07-12 ENCOUNTER — Encounter: Payer: Self-pay | Admitting: Family

## 2013-07-12 ENCOUNTER — Telehealth: Payer: Self-pay | Admitting: *Deleted

## 2013-07-12 NOTE — Addendum Note (Signed)
Addended by: Sandford Craze on: 07/12/2013 12:26 PM   Modules accepted: Orders

## 2013-07-12 NOTE — Telephone Encounter (Signed)
[facility] stating insurance will not cover DEXA for the screening for osteoporosis code. They will cover estrogen deficiency (256.39), hyperparathyroidism, vertebral fracture or osteopenia. They are requesting order be changed with one of the covered diagnosis above.  Please advise.

## 2013-07-12 NOTE — Telephone Encounter (Signed)
Done

## 2013-07-19 ENCOUNTER — Ambulatory Visit (INDEPENDENT_AMBULATORY_CARE_PROVIDER_SITE_OTHER): Payer: Medicare Other | Admitting: Pharmacist

## 2013-07-19 DIAGNOSIS — I359 Nonrheumatic aortic valve disorder, unspecified: Secondary | ICD-10-CM

## 2013-07-19 DIAGNOSIS — Z954 Presence of other heart-valve replacement: Secondary | ICD-10-CM

## 2013-07-19 DIAGNOSIS — I4891 Unspecified atrial fibrillation: Secondary | ICD-10-CM

## 2013-07-19 DIAGNOSIS — I352 Nonrheumatic aortic (valve) stenosis with insufficiency: Secondary | ICD-10-CM

## 2013-07-19 LAB — POCT INR: INR: 2.5

## 2013-07-21 ENCOUNTER — Telehealth: Payer: Self-pay | Admitting: *Deleted

## 2013-07-21 NOTE — Telephone Encounter (Signed)
FYI:  Patient called stating that she has been gaining weight, even with the increase in Lasix and was told by her [employer] nurse that she needed to contact provider office because "she should be losing weight and it could be her kidneys"; asked patient if she had any dizziness, SOB, chest pain--negative just weight gain & edema in feet & ankles. Pt had not contacted cardiology "because she has appt with them on Nov 5th" and at first refused appt for tomorrow "because she already has appt on 10.27.14"; explained that with her Cardiac Hx & CKD, we would prefer to see her tomorrow,to act in the least risky manner in a situation in which one is uncertain about the consequences; pt understood & agreed and took appointment at 1:30p [declined morning appt]/SLS

## 2013-07-22 ENCOUNTER — Encounter: Payer: Self-pay | Admitting: Family

## 2013-07-22 ENCOUNTER — Ambulatory Visit (INDEPENDENT_AMBULATORY_CARE_PROVIDER_SITE_OTHER): Payer: Medicare Other | Admitting: Family

## 2013-07-22 VITALS — BP 120/70 | HR 64 | Resp 16 | Ht 62.0 in | Wt 193.0 lb

## 2013-07-22 DIAGNOSIS — K5900 Constipation, unspecified: Secondary | ICD-10-CM

## 2013-07-22 DIAGNOSIS — I5023 Acute on chronic systolic (congestive) heart failure: Secondary | ICD-10-CM

## 2013-07-22 DIAGNOSIS — M545 Low back pain: Secondary | ICD-10-CM

## 2013-07-22 DIAGNOSIS — I509 Heart failure, unspecified: Secondary | ICD-10-CM

## 2013-07-22 DIAGNOSIS — K5909 Other constipation: Secondary | ICD-10-CM

## 2013-07-22 MED ORDER — FUROSEMIDE 40 MG PO TABS: 40.0000 mg | ORAL_TABLET | Freq: Two times a day (BID) | ORAL | Status: DC

## 2013-07-22 NOTE — Assessment & Plan Note (Signed)
Unchanged.  She declines any other medication such as trial of an ibs medication, will continue to use laxatives prn.

## 2013-07-22 NOTE — Assessment & Plan Note (Signed)
Mild. Discussed likely arthritis. Advised occasional use of tylenol and possible referral to PT. Declines pt referral.  Will try tylenol.

## 2013-07-22 NOTE — Assessment & Plan Note (Signed)
Per our scale she is up 2 pounds. Clinically actually looks and sounds better today. Continue bid lasix, obtain follow up bmet, keep upcoming follow up with cardiology, call if symptoms worsen or if further weight gian.

## 2013-07-22 NOTE — Progress Notes (Signed)
Subjective:    Patient ID: [name], female    DOB: March 11, 1941, 72 y.o.   MRN: [id number]  HPI  Ms. [name] is a 72 yr old female who presents today at the direction of her home health nurse with 2 pound weight gain despite recent increase in lasix in 10/13.  She had been on lasix 40mg  once daily prior to last visit.  Lasix was increased to bid. She reports compliance with this regimen.  She according to the home heath RN her weight is up 5 pounds.  Constipation- reports that she has to take a laxative to have a bm.  Tried metamucil wafers without improvement.  Back pain- reports that today she used one of the topical heating pad which helped.   Reports that she started working 2-3 hours a day.    Review of Systems See HPI  Past Medical History  Diagnosis Date  . History of ventricular tachycardia   . Peripheral edema   . Aortic insufficiency     Moderate 2012  . History of gout   . Exogenous obesity   . History of CHF (congestive heart failure)     EF 45% 2012, nonischemic (Normal coronaries 2002)  . 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 Hypertension   . 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 ICD (implantable cardiac defibrillator) in place 2012  . DOE (dyspnea on exertion)   . Stroke 2000  . Renal insufficiency   . Myocardial infarction 2000  . Anxiety   . 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 Anemia   . Presence of IVC filter 2000    History   Social History  . Marital Status: Divorced    Spouse Name: N/A    Number of Children: N/A  . Years of Education: N/A   Occupational History  . Not on file.   Social History Main Topics  . Smoking status: Never Smoker   . Smokeless tobacco: Never Used  . Alcohol Use: No  . Drug Use: No  . Sexual Activity: No   Other Topics Concern  . Not on file   Social History Narrative   Lives with daughter.       Past Surgical History  Procedure Laterality Date  . Cardiac defibrillator placement    . Cataract extraction    . Embolectomy  02/15/2012    Procedure: EMBOLECTOMY;  Surgeon: [name], MD;  Location: [facility] OR;  Service: Vascular;  Laterality: Left;  left popliteal embolectomy with drain placement  . Insert / replace / remove pacemaker    . Eye surgery  2012    removal of cataracts bilaterally  . Cardiac catheterization    . Tonsillectomy    . Aortic valve replacement  04/12/2012    Procedure: AORTIC VALVE REPLACEMENT (AVR);  Surgeon: Delight Ovens, MD;  Location: [facility] OR;  Service: Open Heart Surgery;  Laterality: N/A;  with nitric oxide  . Thoracic aortic aneurysm repair  04/12/2012    Procedure: THORACIC ASCENDING ANEURYSM REPAIR (AAA);  Surgeon: Delight Ovens, MD;  Location: [facility] OR;  Service: Open Heart Surgery;  Laterality: N/A;  . Clot removal  5.2013    blood clot removed from the back of the left leg  . Aortic valve surgery  7.2013    Family History  Problem Relation Age of Onset  . Hypertension Father   .  Cancer Father   . Coronary artery disease Father 33  . Cancer Son     pancreas, spine    Allergies  Allergen Reactions  . Adhesive [Tape] Other (See Comments)    Redness and skin breaks down  . Latex Rash  . Neomycin-Bacitracin Zn-Polymyx Rash    Current Outpatient Prescriptions on File Prior to Visit  Medication Sig Dispense Refill  . allopurinol (ZYLOPRIM) 100 MG tablet Take 100 mg by mouth 2 (two) times daily.      [name] aspirin 81 MG EC tablet Take 1 tablet (81 mg total) by mouth daily.  100 tablet  prn  . Biotin 5000 MCG CAPS Take 1 capsule (5,000 mcg total) by mouth daily.  30 capsule  6  . Bisacodyl (DULCOLAX PO) Take 1 tablet by mouth daily.      . carvedilol (COREG) 12.5 MG tablet TAKE 1 TABLET BY MOUTH TWICE A DAY WITH MEALS  60 tablet  4  . chlorpheniramine-HYDROcodone (TUSSIONEX) 10-8 MG/5ML LQCR Take 5 mLs by mouth every 12 (twelve)  hours as needed.  155 mL  0  . furosemide (LASIX) 40 MG tablet Take 40 mg by mouth 2 (two) times daily.      [name] NITROSTAT 0.4 MG SL tablet Place 0.4 mg under the tongue every 5 (five) minutes as needed. For chest pain      . potassium chloride SA (KLOR-CON M20) 20 MEQ tablet one tablet PO bid      . rosuvastatin (CRESTOR) 5 MG tablet Take 5 mg by mouth daily. 1/2 po daily except 1 on Saturday      . warfarin (COUMADIN) 5 MG tablet As instructed.       No current facility-administered medications on file prior to visit.    BP 120/70  Pulse 64  Resp 16  Ht 5\' 2"  (1.575 m)  Wt 193 lb (87.544 kg)  BMI 35.29 kg/m2  SpO2 99%       Objective:   Physical Exam  Constitutional: She appears well-developed and well-nourished. No distress.  HENT:  Head: Normocephalic and atraumatic.  Cardiovascular: Normal rate and regular rhythm.   No murmur heard. Pulmonary/Chest: Effort normal and breath sounds normal. No respiratory distress. She has no wheezes. She has no rales. She exhibits no tenderness.  Musculoskeletal:  2+ bilateral LE edema  Skin: Skin is warm and dry.  Psychiatric: She has a normal mood and affect. Her behavior is normal. Judgment and thought content normal.          Assessment & Plan:

## 2013-07-22 NOTE — Patient Instructions (Addendum)
Please continue the lasix twice daily.  Continue to weigh yourself daily. Call if you develop any further weight gain. Follow up in 2 months. Keep upcoming appointment with Dr. Patty Sermons.

## 2013-07-23 LAB — BASIC METABOLIC PANEL
BUN: 27 mg/dL — ABNORMAL HIGH (ref 6–23)
CO2: 29 mEq/L (ref 19–32)
Calcium: 9.4 mg/dL (ref 8.4–10.5)
Creat: 1.47 mg/dL — ABNORMAL HIGH (ref 0.50–1.10)
Glucose, Bld: 67 mg/dL — ABNORMAL LOW (ref 70–99)
Potassium: 4 mEq/L (ref 3.5–5.3)

## 2013-07-25 ENCOUNTER — Ambulatory Visit: Payer: Medicare Other | Admitting: Family

## 2013-07-25 ENCOUNTER — Encounter: Payer: Self-pay | Admitting: Family

## 2013-08-01 ENCOUNTER — Other Ambulatory Visit: Payer: Self-pay | Admitting: Cardiology

## 2013-08-16 ENCOUNTER — Ambulatory Visit (INDEPENDENT_AMBULATORY_CARE_PROVIDER_SITE_OTHER): Payer: Medicare Other | Admitting: *Deleted

## 2013-08-16 DIAGNOSIS — I352 Nonrheumatic aortic (valve) stenosis with insufficiency: Secondary | ICD-10-CM

## 2013-08-16 DIAGNOSIS — I4891 Unspecified atrial fibrillation: Secondary | ICD-10-CM

## 2013-08-16 DIAGNOSIS — Z954 Presence of other heart-valve replacement: Secondary | ICD-10-CM

## 2013-08-16 DIAGNOSIS — I359 Nonrheumatic aortic valve disorder, unspecified: Secondary | ICD-10-CM

## 2013-08-30 ENCOUNTER — Ambulatory Visit
Admission: RE | Admit: 2013-08-30 | Discharge: 2013-08-30 | Disposition: A | Discharge status: Home / Self Care | Payer: Medicare Other | Source: Ambulatory Visit | Attending: Family | Admitting: Family

## 2013-08-30 ENCOUNTER — Other Ambulatory Visit: Payer: Self-pay | Admitting: Cardiology

## 2013-08-30 ENCOUNTER — Other Ambulatory Visit: Payer: Self-pay | Admitting: Family

## 2013-08-30 ENCOUNTER — Ambulatory Visit: Admission: RE | Admit: 2013-08-30 | Payer: Medicare Other | Source: Ambulatory Visit

## 2013-08-30 DIAGNOSIS — Z1231 Encounter for screening mammogram for malignant neoplasm of breast: Secondary | ICD-10-CM

## 2013-08-30 DIAGNOSIS — E2839 Other primary ovarian failure: Secondary | ICD-10-CM

## 2013-08-30 NOTE — Telephone Encounter (Signed)
Received refill request for Crestor 5mg  1 tablet daily.  Current med list stated that pt was taking 1 tablet a day except 1/2 tablet 1 day a week. Verified with pt that she is currently taking medication; 1 tablet every day.  Refill sent, med list updated.

## 2013-09-01 ENCOUNTER — Telehealth: Payer: Self-pay | Admitting: Family

## 2013-09-01 ENCOUNTER — Encounter: Payer: Self-pay | Admitting: Family

## 2013-09-01 DIAGNOSIS — M8580 Other specified disorders of bone density and structure, unspecified site: Secondary | ICD-10-CM

## 2013-09-01 HISTORY — DX: Other specified disorders of bone density and structure, unspecified site: M85.80

## 2013-09-01 NOTE — Telephone Encounter (Signed)
Please call pt and let her know that her bone density shows bone thinning.  Not quite severe enough to start fosamax, but I would recommend that she add caltrate 600mg  +D bid.  Also return to the lab for vitamin D testing. Dx osteopenia.  Plan repeat bone density in 2 yrs.

## 2013-09-02 ENCOUNTER — Ambulatory Visit (INDEPENDENT_AMBULATORY_CARE_PROVIDER_SITE_OTHER): Payer: Medicare Other | Admitting: Cardiology

## 2013-09-02 ENCOUNTER — Encounter: Payer: Self-pay | Admitting: Cardiology

## 2013-09-02 ENCOUNTER — Ambulatory Visit (INDEPENDENT_AMBULATORY_CARE_PROVIDER_SITE_OTHER): Payer: Medicare Other | Admitting: Pharmacist

## 2013-09-02 ENCOUNTER — Encounter (INDEPENDENT_AMBULATORY_CARE_PROVIDER_SITE_OTHER): Payer: Self-pay

## 2013-09-02 VITALS — BP 104/70 | HR 66 | Ht 61.0 in | Wt 196.0 lb

## 2013-09-02 DIAGNOSIS — I4891 Unspecified atrial fibrillation: Secondary | ICD-10-CM

## 2013-09-02 DIAGNOSIS — I5023 Acute on chronic systolic (congestive) heart failure: Secondary | ICD-10-CM

## 2013-09-02 DIAGNOSIS — I359 Nonrheumatic aortic valve disorder, unspecified: Secondary | ICD-10-CM

## 2013-09-02 DIAGNOSIS — Z953 Presence of xenogenic heart valve: Secondary | ICD-10-CM

## 2013-09-02 DIAGNOSIS — I352 Nonrheumatic aortic (valve) stenosis with insufficiency: Secondary | ICD-10-CM

## 2013-09-02 DIAGNOSIS — I5043 Acute on chronic combined systolic (congestive) and diastolic (congestive) heart failure: Secondary | ICD-10-CM

## 2013-09-02 DIAGNOSIS — I509 Heart failure, unspecified: Secondary | ICD-10-CM

## 2013-09-02 DIAGNOSIS — Z952 Presence of prosthetic heart valve: Secondary | ICD-10-CM

## 2013-09-02 DIAGNOSIS — Z954 Presence of other heart-valve replacement: Secondary | ICD-10-CM

## 2013-09-02 DIAGNOSIS — R413 Other amnesia: Secondary | ICD-10-CM

## 2013-09-02 LAB — POCT INR: INR: 1.8

## 2013-09-02 NOTE — Telephone Encounter (Signed)
Unable to reach pt at home #, no voicemail. Cell# is daughter's #; did not leave message. Letter mailed to pt.

## 2013-09-02 NOTE — Patient Instructions (Signed)
Work on diet and weight loss  Your physician recommends that you continue on your current medications as directed. Please refer to the Current Medication list given to you today.  Your physician wants you to follow-up in: 4 month ov/ekg You will receive a reminder letter in the mail two months in advance. If you don't receive a letter, please call our office to schedule the follow-up appointment.

## 2013-09-02 NOTE — Assessment & Plan Note (Signed)
The patient has had no TIA or stroke symptoms.  She remains on long-term Coumadin.  She also has a remote history of an intracerebral hemorrhage.  She has not been having any headaches or dizzy spells.

## 2013-09-02 NOTE — Assessment & Plan Note (Signed)
The patient has done well on her current dose of Lasix 40 mg twice a day.  Her dyspnea is improved and she is not having a significant ankle edema now.

## 2013-09-02 NOTE — Progress Notes (Signed)
[name] Date of Birth:  1941/05/09 [address] [place], [place]  [address] [phone]         Fax   [phone]  History of Present Illness: This pleasant 72 year old woman is seen for a post hospital office visit. 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 [name]. 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 She also has a dilated left ventricle with marked decrease in left ventricular systolic function. She is now on carvedilol 6.25 mg by mouth twice a day. Her symptoms of congestive heart failure have improved significantly since surgery. She is no longer having any ankle edema or significant orthopnea. Her weight is up 8 pounds since last visit.  She has a good appetite.  She now lives with her daughter at the [place]. She remains in chronic atrial fibrillation and is now on Coumadin. Clinically she is much improved since her surgery.  Since we last saw her she went back to work at [employer] for 30 days and she enjoyed the work.  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   Current Outpatient Prescriptions  Medication Sig Dispense Refill  . allopurinol (ZYLOPRIM) 100 MG tablet TAKE 2 TABLETS BY MOUTH EVERY DAY  60 tablet  3  . aspirin 81 MG EC tablet Take 1 tablet (81 mg total) by mouth daily.  100 tablet  prn  . Biotin 5000 MCG CAPS Take 1 capsule (5,000 mcg total) by mouth daily.  30 capsule  6  . Bisacodyl (DULCOLAX PO) Take 1 tablet by mouth daily.      . carvedilol (COREG) 12.5 MG tablet TAKE 1 TABLET BY MOUTH TWICE A DAY WITH MEALS  60 tablet  4  . CRESTOR 5 MG tablet TAKE 1 TABLET (5 MG TOTAL) BY MOUTH DAILY.  30 tablet  3  . furosemide (LASIX) 40 MG tablet Take 1 tablet (40 mg total) by mouth 2 (two) times daily.  60 tablet  3  . NITROSTAT 0.4 MG SL tablet Place 0.4 mg under the tongue  every 5 (five) minutes as needed. For chest pain      . potassium chloride SA (KLOR-CON M20) 20 MEQ tablet one tablet PO bid      . warfarin (COUMADIN) 5 MG tablet TAKE 1 TABLET EVERY EVENING  30 tablet  3  . Calcium Carbonate-Vit D-Min (CALTRATE 600+D PLUS MINERALS) 600-800 MG-UNIT TABS Take 1 tablet by mouth 2 (two) times daily.      . chlorpheniramine-HYDROcodone (TUSSIONEX) 10-8 MG/5ML LQCR Take 5 mLs by mouth every 12 (twelve) hours as needed.  155 mL  0   No current facility-administered medications for this visit.    Allergies  Allergen Reactions  . Adhesive [Tape] Other (See Comments)    Redness and skin breaks down  . Latex Rash  . Neomycin-Bacitracin Zn-Polymyx Rash    Patient Active Problem List   Diagnosis Date Noted  . Aortic insufficiency and aortic stenosis 01/01/2011    Priority: High  . Atrial fibrillation 01/02/2009    Priority: High  . Osteopenia 09/01/2013  . Osteopenia 09/01/2013  . Chronic constipation 07/11/2013  . Frozen shoulder 12/01/2012  . Annual physical exam 07/08/2012  . Heart valve replaced by other means 04/27/2012  . Atherosclerosis of native arteries of the extremities with intermittent claudication 03/03/2012  . Anemia 02/17/2012  .  Lower limb ischemia 02/14/2012  . Acute on chronic systolic congestive heart failure, NYHA class 3 02/14/2012  . Acute on chronic kidney disease, stage 3 02/14/2012  . Cardiac arrest 11/04/2011  . Implantable cardiac defibrillator single-chamber-Medtronic 11/04/2011  . Alopecia 11/04/2011  . ALKALINE PHOSPHATASE, ELEVATED 12/12/2010  . COMBINED HEART FAILURE, ACUTE ON CHRONIC 11/25/2010  . BACK PAIN, LUMBAR 12/19/2009  . Memory loss 09/05/2009  . VENOUS INSUFFICIENCY 01/19/2009  . HYPERLIPIDEMIA 01/02/2009  . GOUT 01/02/2009  . HYPERTENSION 01/02/2009    History  Smoking status  . Never Smoker   Smokeless tobacco  . Never Used    History  Alcohol Use No    Family History  Problem Relation Age  of Onset  . Hypertension Father   . Cancer Father   . Coronary artery disease Father 83  . Cancer Son     pancreas, spine    Review of Systems: Constitutional: no fever chills diaphoresis or fatigue or change in weight.  Head and neck: no hearing loss, no epistaxis, no photophobia or visual disturbance. Respiratory: No cough, shortness of breath or wheezing. Cardiovascular: No chest pain peripheral edema, palpitations. Gastrointestinal: No abdominal distention, no abdominal pain, no change in bowel habits hematochezia or melena. Genitourinary: No dysuria, no frequency, no urgency, no nocturia. Musculoskeletal:No arthralgias, no back pain, no gait disturbance or myalgias. Neurological: No dizziness, no headaches, no numbness, no seizures, no syncope, no weakness, no tremors. Hematologic: No lymphadenopathy, no easy bruising. Psychiatric: No confusion, no hallucinations, no sleep disturbance.    Physical Exam: Filed Vitals:   09/02/13 1349  BP: 104/70  Pulse: 66   the general appearance reveals a well-developed well-nourished elderly woman in no distress.The head and neck exam reveals pupils equal and reactive.  Extraocular movements are full.  There is no scleral icterus.  The mouth and pharynx are normal.  The neck is supple.  The carotids reveal no bruits.  The jugular venous pressure is normal.  The  thyroid is not enlarged.  There is no lymphadenopathy.  The chest is clear to percussion and auscultation.  There are no rales or rhonchi.  Expansion of the chest is symmetrical.  The pulse is irregularly irregular.  The ICD is in the left upper anterior chest. The precordium is quiet.  The first heart sound is normal.  The second heart sound is physiologically split.  There is a soft systolic flow murmur across the prosthetic aortic valve.  No diastolic murmur.  There is no abnormal lift or heave.  The abdomen is soft and nontender.  The bowel sounds are normal.  The liver and spleen are  not enlarged.  There are no abdominal masses.  There are no abdominal bruits.  Extremities reveal good pedal pulses.  There is no phlebitis or edema.  There is no cyanosis or clubbing.  Strength is normal and symmetrical in all extremities.  There is no lateralizing weakness.  There are no sensory deficits.  The skin is warm and dry.  There is no rash.    Assessment / Plan: Continue on same medication.  Work harder on losing weight.  Recheck in 4 months for office visit and EKG.

## 2013-09-02 NOTE — Assessment & Plan Note (Signed)
Memory loss problems appear to have stabilized since last visit.

## 2013-09-08 ENCOUNTER — Ambulatory Visit (INDEPENDENT_AMBULATORY_CARE_PROVIDER_SITE_OTHER): Payer: Medicare Other | Admitting: *Deleted

## 2013-09-08 DIAGNOSIS — I5043 Acute on chronic combined systolic (congestive) and diastolic (congestive) heart failure: Secondary | ICD-10-CM

## 2013-09-08 DIAGNOSIS — I469 Cardiac arrest, cause unspecified: Secondary | ICD-10-CM

## 2013-09-08 LAB — MDC_IDC_ENUM_SESS_TYPE_INCLINIC
Lead Channel Impedance Value: 768 Ohm
Lead Channel Pacing Threshold Amplitude: 1 V
Lead Channel Pacing Threshold Pulse Width: 0.6 ms
Lead Channel Setting Pacing Pulse Width: 0.6 ms
Zone Setting Detection Interval: 290 ms
Zone Setting Detection Interval: 330 ms
Zone Setting Detection Interval: 450 ms

## 2013-09-08 NOTE — Progress Notes (Signed)
ICD check in clinic. Normal device function. Threshold and sensing consistent with previous device measurements. Impedance trends stable over time. No evidence of any ventricular arrhythmias. Histogram distribution appropriate for patient and level of activity. No changes made this session. Device programmed at appropriate safety margins. Device programmed to optimize intrinsic conduction. Current batt voltage 3.06V (ERI 2.62V). Thoracic impedance has been below baseline since 11-12 and patient has been experiencing SOB x 2 weeks---(staff message sent to primary cardiologist). Patient education completed about avoiding foods rich in sodium. Pt will follow up with SK in 3 months. Patient educated about shock plan.

## 2013-09-08 NOTE — Progress Notes (Deleted)
ICD check in clinic. Normal device function. Threshold and sensing consistent with previous device measurements. Impedance trends stable over time. No evidence of any ventricular arrhythmias. Histogram distribution appropriate for patient and level of activity. No changes made this session. Device programmed at appropriate safety margins. Device programmed to optimize intrinsic conduction. Current batt voltage 3.06V (ERI 2.62V). Pt will follow up with SK in 3 months. Patient education completed including shock plan.

## 2013-09-09 ENCOUNTER — Telehealth: Payer: Self-pay | Admitting: *Deleted

## 2013-09-09 NOTE — Telephone Encounter (Signed)
Message copied by Burnell Blanks on Fri Sep 09, 2013  6:43 PM ------      Message from: [name]      Created: Thu Sep 08, 2013  4:51 PM      Regarding: FW: Patient optivol reading       Instruct patient to increase lasix to 80 mg in am and continue 40 mg in the pm.      TB      ----- Message -----         From: Sebastian Ache, CMA         Sent: 09/08/2013   1:02 PM           To: [name], MD      Subject: Patient optivol reading                                  Good afternoon Dr. Patty Sermons,                  This patient [name]) was seen in the device clinic for a routine defib interrogation today. She mentioned that she has noticed increased SOB x two weeks. Patient's Optivol (thoracic impedance measurement) is consistent with the complaints. Patient states that she does eat out quite often at cafeterias. I encouraged her to avoid salty foods and told her that if anything further needed to be done then she would be notified. She currently takes 40mg  BID of Lasix. Her last BUN: 27 (10/24), Creat: 1.47 (10/24), K+: 4.0 (10/24).            Thanks,            Kae Heller. Lelon Perla       ------

## 2013-09-09 NOTE — Telephone Encounter (Signed)
Left message to call back  

## 2013-09-13 NOTE — Telephone Encounter (Signed)
[name], verbalized understanding

## 2013-09-14 ENCOUNTER — Ambulatory Visit (INDEPENDENT_AMBULATORY_CARE_PROVIDER_SITE_OTHER): Payer: Medicare Other | Admitting: Family

## 2013-09-14 ENCOUNTER — Encounter: Payer: Self-pay | Admitting: Family

## 2013-09-14 VITALS — BP 110/60 | HR 67 | Resp 16 | Ht 62.0 in | Wt 192.0 lb

## 2013-09-14 DIAGNOSIS — N289 Disorder of kidney and ureter, unspecified: Secondary | ICD-10-CM

## 2013-09-14 DIAGNOSIS — Z23 Encounter for immunization: Secondary | ICD-10-CM

## 2013-09-14 DIAGNOSIS — R5381 Other malaise: Secondary | ICD-10-CM

## 2013-09-14 DIAGNOSIS — I5043 Acute on chronic combined systolic (congestive) and diastolic (congestive) heart failure: Secondary | ICD-10-CM

## 2013-09-14 DIAGNOSIS — Z5181 Encounter for therapeutic drug level monitoring: Secondary | ICD-10-CM

## 2013-09-14 LAB — CBC WITH DIFFERENTIAL/PLATELET
Basophils Absolute: 0.1 10*3/uL (ref 0.0–0.1)
Basophils Relative: 1 % (ref 0–1)
Eosinophils Absolute: 0.3 10*3/uL (ref 0.0–0.7)
Eosinophils Relative: 5 % (ref 0–5)
HCT: 44.1 % (ref 36.0–46.0)
Hemoglobin: 15 g/dL (ref 12.0–15.0)
MCH: 31.4 pg (ref 26.0–34.0)
MCHC: 34 g/dL (ref 30.0–36.0)
MCV: 92.5 fL (ref 78.0–100.0)
Monocytes Absolute: 0.8 10*3/uL (ref 0.1–1.0)
Monocytes Relative: 13 % — ABNORMAL HIGH (ref 3–12)

## 2013-09-14 LAB — BASIC METABOLIC PANEL
CO2: 30 mEq/L (ref 19–32)
Chloride: 101 mEq/L (ref 96–112)
Glucose, Bld: 86 mg/dL (ref 70–99)
Potassium: 4.1 mEq/L (ref 3.5–5.3)
Sodium: 142 mEq/L (ref 135–145)

## 2013-09-14 MED ORDER — HYDROCOD POLST-CHLORPHEN POLST 10-8 MG/5ML PO LQCR: 5.0000 mL | Freq: Two times a day (BID) | ORAL | Status: DC | PRN

## 2013-09-14 NOTE — Progress Notes (Signed)
Pre visit review using our clinic review tool, if applicable. No additional management support is needed unless otherwise documented below in the visit note. 

## 2013-09-14 NOTE — Progress Notes (Signed)
Subjective:    Patient ID: VAUDA [name], female    DOB: 08-25-1941, 72 y.o.   MRN: [id number]  HPI  [name] is a 72 yr old female who presents today for follow up of CHF.  She met with Dr. Patty Sermons her cardiologist on 12/5. She is maintained on furosemide.  She is down 1 pound since October.  She continues bid furosemide. She reports some mild SOB and fatigue for about 2 weeks.  She denies black or bloody stools.   Wt Readings from Last 3 Encounters:  09/14/13 192 lb (87.091 kg)  09/02/13 196 lb (88.905 kg)  07/22/13 193 lb (87.544 kg)    Review of Systems See HPI  Past Medical History  Diagnosis Date  . History of ventricular tachycardia   . Peripheral edema   . Aortic insufficiency     Moderate 2012  . History of gout   . Exogenous obesity   . History of CHF (congestive heart failure)     EF 45% 2012, nonischemic (Normal coronaries 2002)  . 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 Hypertension   . 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 ICD (implantable cardiac defibrillator) in place 2012  . DOE (dyspnea on exertion)   . Stroke 2000  . Renal insufficiency   . Myocardial infarction 2000  . Anxiety   . 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 Anemia   . Presence of IVC filter 2000  . Osteopenia 09/01/2013    History   Social History  . Marital Status: Divorced    Spouse Name: N/A    Number of Children: N/A  . Years of Education: N/A   Occupational History  . Not on file.   Social History Main Topics  . Smoking status: Never Smoker   . Smokeless tobacco: Never Used  . Alcohol Use: No  . Drug Use: No  . Sexual Activity: No   Other Topics Concern  . Not on file   Social History Narrative   Lives with daughter.      Past Surgical History  Procedure Laterality Date  . Cardiac defibrillator placement    . Cataract extraction    . Embolectomy   02/15/2012    Procedure: EMBOLECTOMY;  Surgeon: [name], MD;  Location[facility] OR;  Service: Vascular;  Laterality: Left;  left popliteal embolectomy with drain placement  . Insert / replace / remove pacemaker    . Eye surgery  2012    removal of cataracts bilaterally  . Cardiac catheterization    . Tonsillectomy    . Aortic valve replacement  04/12/2012    Procedure: AORTIC VALVE REPLACEMENT (AVR);  Surgeon: Delight Ovens, MD;  Location: [facility] OR;  Service: Open Heart Surgery;  Laterality: N/A;  with nitric oxide  . Thoracic aortic aneurysm repair  04/12/2012    Procedure: THORACIC ASCENDING ANEURYSM REPAIR (AAA);  Surgeon: Delight Ovens, MD;  Location: [facility] OR;  Service: Open Heart Surgery;  Laterality: N/A;  . Clot removal  5.2013    blood clot removed from the back of the left leg  . Aortic valve surgery  7.2013    Family History  Problem Relation Age of Onset  . Hypertension Father   . Cancer Father   . Coronary artery disease Father 29  . Cancer Son     pancreas, spine    Allergies  Allergen Reactions  . Adhesive [Tape] Other (See Comments)    Redness and skin breaks down  . Latex Rash  . Neomycin-Bacitracin Zn-Polymyx Rash    Current Outpatient Prescriptions on File Prior to Visit  Medication Sig Dispense Refill  . allopurinol (ZYLOPRIM) 100 MG tablet TAKE 2 TABLETS BY MOUTH EVERY DAY  60 tablet  3  . aspirin 81 MG EC tablet Take 1 tablet (81 mg total) by mouth daily.  100 tablet  prn  . Biotin 5000 MCG CAPS Take 1 capsule (5,000 mcg total) by mouth daily.  30 capsule  6  . Bisacodyl (DULCOLAX PO) Take 1 tablet by mouth daily.      . Calcium Carbonate-Vit D-Min (CALTRATE 600+D PLUS MINERALS) 600-800 MG-UNIT TABS Take 1 tablet by mouth 2 (two) times daily.      . carvedilol (COREG) 12.5 MG tablet TAKE 1 TABLET BY MOUTH TWICE A DAY WITH MEALS  60 tablet  4  . CRESTOR 5 MG tablet TAKE 1 TABLET (5 MG TOTAL) BY MOUTH DAILY.  30 tablet  3  . furosemide (LASIX) 40  MG tablet Take 40 mg by mouth as directed. 2 in the morning and 1 in the evening      . NITROSTAT 0.4 MG SL tablet Place 0.4 mg under the tongue every 5 (five) minutes as needed. For chest pain      . potassium chloride SA (KLOR-CON M20) 20 MEQ tablet one tablet PO bid      . warfarin (COUMADIN) 5 MG tablet TAKE 1 TABLET EVERY EVENING  30 tablet  3   No current facility-administered medications on file prior to visit.    BP 110/60  Pulse 67  Resp 16  Ht 5\' 2"  (1.575 m)  Wt 192 lb (87.091 kg)  BMI 35.11 kg/m2  SpO2 96%       Objective:   Physical Exam  Constitutional: She is oriented to person, place, and time. She appears well-developed and well-nourished. No distress.  HENT:  Head: Normocephalic and atraumatic.  Cardiovascular: Normal rate and regular rhythm.   No murmur heard. Pulmonary/Chest: Effort normal and breath sounds normal. No respiratory distress. She has no wheezes. She has no rales. She exhibits no tenderness.  Musculoskeletal:  1+ bilaterally LE edema  Neurological: She is alert and oriented to person, place, and time.  Skin: Skin is warm and dry.  Psychiatric: She has a normal mood and affect. Her behavior is normal. Judgment and thought content normal.          Assessment & Plan:

## 2013-09-14 NOTE — Patient Instructions (Signed)
Please complete lab work prior to leaving. Follow up in 3 months.  

## 2013-09-15 ENCOUNTER — Encounter: Payer: Self-pay | Admitting: Family

## 2013-09-16 NOTE — Assessment & Plan Note (Signed)
Hemodynamically stable.  Continue current dosing of furosemide. Renal function is stable.  CBC shows normal blood count and TSH normal.  No clear explanation for fatigue other than multfactorial due to deconditioning and cardiac history.  Monitor for now.  Continue current furosemide dosing.

## 2013-09-19 ENCOUNTER — Ambulatory Visit (INDEPENDENT_AMBULATORY_CARE_PROVIDER_SITE_OTHER): Payer: Medicare Other | Admitting: Family

## 2013-09-19 ENCOUNTER — Encounter: Payer: Self-pay | Admitting: Family

## 2013-09-19 VITALS — HR 70 | Temp 97.9°F | Resp 16 | Ht 62.0 in

## 2013-09-19 DIAGNOSIS — I509 Heart failure, unspecified: Secondary | ICD-10-CM

## 2013-09-19 DIAGNOSIS — M109 Gout, unspecified: Secondary | ICD-10-CM

## 2013-09-19 DIAGNOSIS — I5022 Chronic systolic (congestive) heart failure: Secondary | ICD-10-CM

## 2013-09-19 MED ORDER — COLCHICINE 0.6 MG PO TABS: ORAL_TABLET | ORAL | Status: DC

## 2013-09-19 NOTE — Progress Notes (Signed)
Subjective:    Patient ID: [name], female    DOB: 01/17/1941, 72 y.o.   MRN: [id number]  HPI  1) Foot pain- reports + soreness.  Started to hurt last night. Denies known injury.  2) Ears popping-  Denies pain.  She denies nasal congestion  3) Wheezing- intermittent, she does not hear wheezing but daughter notes + wheezing. Pt denis SOB. She notes that her wheezing was worst on the days that she stopped her fluid pill.     Review of Systems See HPI  Past Medical History  Diagnosis Date  . History of ventricular tachycardia   . Peripheral edema   . Aortic insufficiency     Moderate 2012  . History of gout   . Exogenous obesity   . History of CHF (congestive heart failure)     EF 45% 2012, nonischemic (Normal coronaries 2002)  . 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 Hypertension   . 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 ICD (implantable cardiac defibrillator) in place 2012  . DOE (dyspnea on exertion)   . Stroke 2000  . Renal insufficiency   . Myocardial infarction 2000  . Anxiety   . 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 Anemia   . Presence of IVC filter 2000  . Osteopenia 09/01/2013    History   Social History  . Marital Status: Divorced    Spouse Name: N/A    Number of Children: N/A  . Years of Education: N/A   Occupational History  . Not on file.   Social History Main Topics  . Smoking status: Never Smoker   . Smokeless tobacco: Never Used  . Alcohol Use: No  . Drug Use: No  . Sexual Activity: No   Other Topics Concern  . Not on file   Social History Narrative   Lives with daughter.      Past Surgical History  Procedure Laterality Date  . Cardiac defibrillator placement    . Cataract extraction    . Embolectomy  02/15/2012    Procedure: EMBOLECTOMY;  Surgeon: [name], MD;  Location: [facility] OR;  Service: Vascular;   Laterality: Left;  left popliteal embolectomy with drain placement  . Insert / replace / remove pacemaker    . Eye surgery  2012    removal of cataracts bilaterally  . Cardiac catheterization    . Tonsillectomy    . Aortic valve replacement  04/12/2012    Procedure: AORTIC VALVE REPLACEMENT (AVR);  Surgeon: Delight Ovens, MD;  Location: [facility] OR;  Service: Open Heart Surgery;  Laterality: N/A;  with nitric oxide  . Thoracic aortic aneurysm repair  04/12/2012    Procedure: THORACIC ASCENDING ANEURYSM REPAIR (AAA);  Surgeon: Delight Ovens, MD;  Location: [facility] OR;  Service: Open Heart Surgery;  Laterality: N/A;  . Clot removal  5.2013    blood clot removed from the back of the left leg  . Aortic valve surgery  7.2013    Family History  Problem Relation Age of Onset  . Hypertension Father   . Cancer Father   . Coronary artery disease Father 59  . Cancer Son     pancreas, spine    Allergies  Allergen Reactions  . Adhesive [Tape] Other (See Comments)    Redness and skin breaks down  . Latex Rash  . Neomycin-Bacitracin Zn-Polymyx Rash  Current Outpatient Prescriptions on File Prior to Visit  Medication Sig Dispense Refill  . allopurinol (ZYLOPRIM) 100 MG tablet TAKE 2 TABLETS BY MOUTH EVERY DAY  60 tablet  3  . aspirin 81 MG EC tablet Take 1 tablet (81 mg total) by mouth daily.  100 tablet  prn  . Biotin 5000 MCG CAPS Take 1 capsule (5,000 mcg total) by mouth daily.  30 capsule  6  . Bisacodyl (DULCOLAX PO) Take 1 tablet by mouth daily.      . Calcium Carbonate-Vit D-Min (CALTRATE 600+D PLUS MINERALS) 600-800 MG-UNIT TABS Take 1 tablet by mouth 2 (two) times daily.      . carvedilol (COREG) 12.5 MG tablet TAKE 1 TABLET BY MOUTH TWICE A DAY WITH MEALS  60 tablet  4  . chlorpheniramine-HYDROcodone (TUSSIONEX) 10-8 MG/5ML LQCR Take 5 mLs by mouth every 12 (twelve) hours as needed.  155 mL  0  . CRESTOR 5 MG tablet TAKE 1 TABLET (5 MG TOTAL) BY MOUTH DAILY.  30 tablet  3  .  furosemide (LASIX) 40 MG tablet Take 40 mg by mouth as directed. 2 in the morning and 1 in the evening      . NITROSTAT 0.4 MG SL tablet Place 0.4 mg under the tongue every 5 (five) minutes as needed. For chest pain      . potassium chloride SA (KLOR-CON M20) 20 MEQ tablet one tablet PO bid      . warfarin (COUMADIN) 5 MG tablet TAKE 1 TABLET EVERY EVENING  30 tablet  3   No current facility-administered medications on file prior to visit.    BP   Pulse 70  Temp(Src) 97.9 F (36.6 C) (Oral)  Resp 16  Ht 5\' 2"  (1.575 m)  SpO2 99%       Objective:   Physical Exam  Constitutional: She is oriented to person, place, and time. She appears well-developed and well-nourished. No distress.  HENT:  Head: Normocephalic and atraumatic.  Right Ear: Tympanic membrane and ear canal normal.  Left Ear: Tympanic membrane and ear canal normal.  Cardiovascular: Normal rate and regular rhythm.   No murmur heard. Pulmonary/Chest: Effort normal and breath sounds normal. No respiratory distress. She has no rales. She exhibits no tenderness.  + pseudowheeze noted during interview.  No wheezing noted on exam.   Musculoskeletal:  Left lateral foot is very tender to touch.  Mild erythema is present.  + warmth  Lymphadenopathy:    She has no cervical adenopathy.  Neurological: She is alert and oriented to person, place, and time.  Skin: Skin is warm and dry.  Psychiatric: She has a normal mood and affect. Her behavior is normal. Judgment and thought content normal.          Assessment & Plan:

## 2013-09-19 NOTE — Progress Notes (Signed)
Pre visit review using our clinic review tool, if applicable. No additional management support is needed unless otherwise documented below in the visit note. 

## 2013-09-19 NOTE — Assessment & Plan Note (Signed)
Will rx with colchicine.

## 2013-09-19 NOTE — Patient Instructions (Signed)
Hold crestor x 2 days due to use of colchicine for your gout. Call if increased pain, swelling, redness of foot or if you develop fever. Call if foot pain is not improved in 2-3 days.

## 2013-09-23 NOTE — Assessment & Plan Note (Signed)
Appears euvolemic today. Advised pt on importance of compliance with lasix.

## 2013-09-26 ENCOUNTER — Ambulatory Visit (INDEPENDENT_AMBULATORY_CARE_PROVIDER_SITE_OTHER): Payer: Medicare Other | Admitting: Pharmacist

## 2013-09-26 ENCOUNTER — Encounter: Payer: Self-pay | Admitting: Family

## 2013-09-26 DIAGNOSIS — I359 Nonrheumatic aortic valve disorder, unspecified: Secondary | ICD-10-CM

## 2013-09-26 DIAGNOSIS — I4891 Unspecified atrial fibrillation: Secondary | ICD-10-CM

## 2013-09-26 DIAGNOSIS — Z954 Presence of other heart-valve replacement: Secondary | ICD-10-CM

## 2013-09-26 DIAGNOSIS — I352 Nonrheumatic aortic (valve) stenosis with insufficiency: Secondary | ICD-10-CM

## 2013-09-26 LAB — POCT INR: INR: 1.8

## 2013-10-03 ENCOUNTER — Encounter: Payer: Self-pay | Admitting: Family

## 2013-10-17 ENCOUNTER — Encounter (INDEPENDENT_AMBULATORY_CARE_PROVIDER_SITE_OTHER): Payer: Self-pay

## 2013-10-17 ENCOUNTER — Ambulatory Visit (INDEPENDENT_AMBULATORY_CARE_PROVIDER_SITE_OTHER): Payer: Medicare Other | Admitting: *Deleted

## 2013-10-17 DIAGNOSIS — I4891 Unspecified atrial fibrillation: Secondary | ICD-10-CM

## 2013-10-17 DIAGNOSIS — I352 Nonrheumatic aortic (valve) stenosis with insufficiency: Secondary | ICD-10-CM

## 2013-10-17 DIAGNOSIS — I359 Nonrheumatic aortic valve disorder, unspecified: Secondary | ICD-10-CM

## 2013-10-17 DIAGNOSIS — Z954 Presence of other heart-valve replacement: Secondary | ICD-10-CM

## 2013-10-17 LAB — POCT INR: INR: 2.2

## 2013-10-24 ENCOUNTER — Telehealth: Payer: Self-pay | Admitting: Family

## 2013-10-24 DIAGNOSIS — M8580 Other specified disorders of bone density and structure, unspecified site: Secondary | ICD-10-CM

## 2013-10-24 NOTE — Telephone Encounter (Signed)
Please call pt and let her know that her bone density shows some osteopenia (bone thinning).  I would like her to add caltrate + D 600mg  twice daily and make sure to get regular exercise such as walking.  I would also like to check a vitamin D level please (dx is osteopenia)

## 2013-10-25 NOTE — Telephone Encounter (Signed)
Notified pt and entered the lab order.

## 2013-11-04 ENCOUNTER — Emergency Department (HOSPITAL_BASED_OUTPATIENT_CLINIC_OR_DEPARTMENT_OTHER): Payer: Medicare Other

## 2013-11-04 ENCOUNTER — Inpatient Hospital Stay (HOSPITAL_BASED_OUTPATIENT_CLINIC_OR_DEPARTMENT_OTHER)
Admission: EM | Admit: 2013-11-04 | Discharge: 2013-11-11 | DRG: 291 | Disposition: A | Discharge status: Home Health | Payer: Medicare Other | Attending: Cardiology | Admitting: Cardiology

## 2013-11-04 ENCOUNTER — Encounter (HOSPITAL_BASED_OUTPATIENT_CLINIC_OR_DEPARTMENT_OTHER): Payer: Self-pay | Admitting: Emergency Medicine

## 2013-11-04 DIAGNOSIS — I482 Chronic atrial fibrillation, unspecified: Secondary | ICD-10-CM | POA: Diagnosis present

## 2013-11-04 DIAGNOSIS — T380X5A Adverse effect of glucocorticoids and synthetic analogues, initial encounter: Secondary | ICD-10-CM | POA: Diagnosis present

## 2013-11-04 DIAGNOSIS — N179 Acute kidney failure, unspecified: Secondary | ICD-10-CM | POA: Diagnosis present

## 2013-11-04 DIAGNOSIS — I252 Old myocardial infarction: Secondary | ICD-10-CM

## 2013-11-04 DIAGNOSIS — K5900 Constipation, unspecified: Secondary | ICD-10-CM | POA: Diagnosis present

## 2013-11-04 DIAGNOSIS — Z952 Presence of prosthetic heart valve: Secondary | ICD-10-CM

## 2013-11-04 DIAGNOSIS — I70219 Atherosclerosis of native arteries of extremities with intermittent claudication, unspecified extremity: Secondary | ICD-10-CM

## 2013-11-04 DIAGNOSIS — Z954 Presence of other heart-valve replacement: Secondary | ICD-10-CM

## 2013-11-04 DIAGNOSIS — I5022 Chronic systolic (congestive) heart failure: Secondary | ICD-10-CM

## 2013-11-04 DIAGNOSIS — I4891 Unspecified atrial fibrillation: Secondary | ICD-10-CM

## 2013-11-04 DIAGNOSIS — E119 Type 2 diabetes mellitus without complications: Secondary | ICD-10-CM | POA: Diagnosis present

## 2013-11-04 DIAGNOSIS — I5023 Acute on chronic systolic (congestive) heart failure: Secondary | ICD-10-CM

## 2013-11-04 DIAGNOSIS — Z7901 Long term (current) use of anticoagulants: Secondary | ICD-10-CM

## 2013-11-04 DIAGNOSIS — N183 Chronic kidney disease, stage 3 unspecified: Secondary | ICD-10-CM

## 2013-11-04 DIAGNOSIS — I352 Nonrheumatic aortic (valve) stenosis with insufficiency: Secondary | ICD-10-CM

## 2013-11-04 DIAGNOSIS — J9600 Acute respiratory failure, unspecified whether with hypoxia or hypercapnia: Secondary | ICD-10-CM

## 2013-11-04 DIAGNOSIS — K5909 Other constipation: Secondary | ICD-10-CM

## 2013-11-04 DIAGNOSIS — I5043 Acute on chronic combined systolic (congestive) and diastolic (congestive) heart failure: Principal | ICD-10-CM

## 2013-11-04 DIAGNOSIS — M109 Gout, unspecified: Secondary | ICD-10-CM | POA: Diagnosis present

## 2013-11-04 DIAGNOSIS — I160 Hypertensive urgency: Secondary | ICD-10-CM

## 2013-11-04 DIAGNOSIS — Z8249 Family history of ischemic heart disease and other diseases of the circulatory system: Secondary | ICD-10-CM

## 2013-11-04 DIAGNOSIS — I10 Essential (primary) hypertension: Secondary | ICD-10-CM | POA: Diagnosis present

## 2013-11-04 DIAGNOSIS — I129 Hypertensive chronic kidney disease with stage 1 through stage 4 chronic kidney disease, or unspecified chronic kidney disease: Secondary | ICD-10-CM | POA: Diagnosis present

## 2013-11-04 DIAGNOSIS — I2510 Atherosclerotic heart disease of native coronary artery without angina pectoris: Secondary | ICD-10-CM | POA: Diagnosis present

## 2013-11-04 DIAGNOSIS — E669 Obesity, unspecified: Secondary | ICD-10-CM | POA: Diagnosis present

## 2013-11-04 DIAGNOSIS — Z9849 Cataract extraction status, unspecified eye: Secondary | ICD-10-CM

## 2013-11-04 DIAGNOSIS — I359 Nonrheumatic aortic valve disorder, unspecified: Secondary | ICD-10-CM

## 2013-11-04 DIAGNOSIS — J209 Acute bronchitis, unspecified: Secondary | ICD-10-CM

## 2013-11-04 DIAGNOSIS — Z95810 Presence of automatic (implantable) cardiac defibrillator: Secondary | ICD-10-CM

## 2013-11-04 DIAGNOSIS — I428 Other cardiomyopathies: Secondary | ICD-10-CM | POA: Diagnosis present

## 2013-11-04 DIAGNOSIS — Z951 Presence of aortocoronary bypass graft: Secondary | ICD-10-CM

## 2013-11-04 DIAGNOSIS — I509 Heart failure, unspecified: Secondary | ICD-10-CM | POA: Diagnosis present

## 2013-11-04 DIAGNOSIS — J440 Chronic obstructive pulmonary disease with acute lower respiratory infection: Secondary | ICD-10-CM | POA: Diagnosis present

## 2013-11-04 DIAGNOSIS — E785 Hyperlipidemia, unspecified: Secondary | ICD-10-CM | POA: Diagnosis present

## 2013-11-04 DIAGNOSIS — Z8673 Personal history of transient ischemic attack (TIA), and cerebral infarction without residual deficits: Secondary | ICD-10-CM

## 2013-11-04 HISTORY — DX: Atherosclerotic heart disease of native coronary artery without angina pectoris: I25.10

## 2013-11-04 LAB — CBC WITH DIFFERENTIAL/PLATELET
BASOS ABS: 0 10*3/uL (ref 0.0–0.1)
BASOS PCT: 1 % (ref 0–1)
EOS PCT: 4 % (ref 0–5)
Eosinophils Absolute: 0.3 10*3/uL (ref 0.0–0.7)
HEMATOCRIT: 44.4 % (ref 36.0–46.0)
Hemoglobin: 14.5 g/dL (ref 12.0–15.0)
Lymphocytes Relative: 14 % (ref 12–46)
Lymphs Abs: 1.1 10*3/uL (ref 0.7–4.0)
MCH: 31 pg (ref 26.0–34.0)
MCHC: 32.7 g/dL (ref 30.0–36.0)
MCV: 95.1 fL (ref 78.0–100.0)
Monocytes Absolute: 1.6 10*3/uL — ABNORMAL HIGH (ref 0.1–1.0)
Monocytes Relative: 21 % — ABNORMAL HIGH (ref 3–12)
NEUTROS ABS: 4.7 10*3/uL (ref 1.7–7.7)
Neutrophils Relative %: 61 % (ref 43–77)
Platelets: 156 10*3/uL (ref 150–400)
RBC: 4.67 MIL/uL (ref 3.87–5.11)
RDW: 13.9 % (ref 11.5–15.5)
WBC: 7.7 10*3/uL (ref 4.0–10.5)

## 2013-11-04 LAB — POCT I-STAT 3, ART BLOOD GAS (G3+)
Acid-Base Excess: 3 mmol/L — ABNORMAL HIGH (ref 0.0–2.0)
Bicarbonate: 27.6 mEq/L — ABNORMAL HIGH (ref 20.0–24.0)
O2 Saturation: 97 %
PCO2 ART: 43.1 mmHg (ref 35.0–45.0)
TCO2: 29 mmol/L (ref 0–100)
pH, Arterial: 7.414 (ref 7.350–7.450)
pO2, Arterial: 92 mmHg (ref 80.0–100.0)

## 2013-11-04 LAB — COMPREHENSIVE METABOLIC PANEL
ALBUMIN: 4 g/dL (ref 3.5–5.2)
ALT: 12 U/L (ref 0–35)
AST: 24 U/L (ref 0–37)
Alkaline Phosphatase: 86 U/L (ref 39–117)
BUN: 21 mg/dL (ref 6–23)
CALCIUM: 9.2 mg/dL (ref 8.4–10.5)
CHLORIDE: 99 meq/L (ref 96–112)
CO2: 24 mEq/L (ref 19–32)
CREATININE: 1.3 mg/dL — AB (ref 0.50–1.10)
GFR calc Af Amer: 47 mL/min — ABNORMAL LOW (ref >90)
GFR calc non Af Amer: 40 mL/min — ABNORMAL LOW (ref >90)
Glucose, Bld: 95 mg/dL (ref 70–99)
Potassium: 4.4 mEq/L (ref 3.7–5.3)
Sodium: 140 mEq/L (ref 137–147)
Total Bilirubin: 0.5 mg/dL (ref 0.3–1.2)
Total Protein: 8.1 g/dL (ref 6.0–8.3)

## 2013-11-04 LAB — PRO B NATRIURETIC PEPTIDE: Pro B Natriuretic peptide (BNP): 3503 pg/mL — ABNORMAL HIGH (ref 0–125)

## 2013-11-04 LAB — PROTIME-INR
INR: 1.66 — AB (ref 0.00–1.49)
PROTHROMBIN TIME: 19.1 s — AB (ref 11.6–15.2)

## 2013-11-04 LAB — TROPONIN I: Troponin I: <0.3 ng/mL (ref <0.30)

## 2013-11-04 LAB — MRSA PCR SCREENING: MRSA by PCR: NEGATIVE

## 2013-11-04 MED ORDER — SODIUM CHLORIDE 0.9 % IV SOLN
250.0000 mL | INTRAVENOUS | Status: DC | PRN
  Administered 2013-11-04: via INTRAVENOUS

## 2013-11-04 MED ORDER — WARFARIN SODIUM 4 MG PO TABS
4.0000 mg | ORAL_TABLET | Freq: Once | ORAL | Status: AC
  Administered 2013-11-05: 4 mg via ORAL
  Filled 2013-11-04: qty 1

## 2013-11-04 MED ORDER — LEVALBUTEROL HCL 1.25 MG/0.5ML IN NEBU
1.2500 mg | INHALATION_SOLUTION | Freq: Once | RESPIRATORY_TRACT | Status: AC
  Administered 2013-11-04: 1.25 mg via RESPIRATORY_TRACT
  Filled 2013-11-04: qty 0.5

## 2013-11-04 MED ORDER — ONDANSETRON HCL 4 MG/2ML IJ SOLN: 4.0000 mg | Freq: Four times a day (QID) | INTRAMUSCULAR | Status: DC | PRN

## 2013-11-04 MED ORDER — FUROSEMIDE 10 MG/ML IJ SOLN
80.0000 mg | Freq: Once | INTRAMUSCULAR | Status: AC
  Administered 2013-11-04: 80 mg via INTRAVENOUS
  Filled 2013-11-04: qty 8

## 2013-11-04 MED ORDER — ALBUTEROL SULFATE (2.5 MG/3ML) 0.083% IN NEBU
2.5000 mg | INHALATION_SOLUTION | RESPIRATORY_TRACT | Status: DC | PRN
  Administered 2013-11-06 – 2013-11-07 (×2): 2.5 mg via RESPIRATORY_TRACT
  Filled 2013-11-04 (×2): qty 3

## 2013-11-04 MED ORDER — ATORVASTATIN CALCIUM 10 MG PO TABS
10.0000 mg | ORAL_TABLET | Freq: Every day | ORAL | Status: DC
  Administered 2013-11-05 – 2013-11-10 (×6): 10 mg via ORAL
  Filled 2013-11-04 (×7): qty 1

## 2013-11-04 MED ORDER — WARFARIN - PHARMACIST DOSING INPATIENT
Freq: Every day | Status: DC
  Administered 2013-11-07 – 2013-11-08 (×2)

## 2013-11-04 MED ORDER — NITROGLYCERIN IN D5W 200-5 MCG/ML-% IV SOLN
2.0000 ug/min | INTRAVENOUS | Status: DC
Start: 2013-11-04 — End: 2013-11-06
  Administered 2013-11-04: 5 ug/min via INTRAVENOUS
  Filled 2013-11-04 (×2): qty 250

## 2013-11-04 MED ORDER — FUROSEMIDE 10 MG/ML IJ SOLN
80.0000 mg | Freq: Two times a day (BID) | INTRAMUSCULAR | Status: DC
  Administered 2013-11-05 – 2013-11-06 (×3): 80 mg via INTRAVENOUS
  Filled 2013-11-04 (×6): qty 8

## 2013-11-04 MED ORDER — ALBUTEROL SULFATE (2.5 MG/3ML) 0.083% IN NEBU
2.5000 mg | INHALATION_SOLUTION | RESPIRATORY_TRACT | Status: DC
Start: 2013-11-04 — End: 2013-11-04
  Filled 2013-11-04: qty 3

## 2013-11-04 MED ORDER — CARVEDILOL 12.5 MG PO TABS
12.5000 mg | ORAL_TABLET | Freq: Two times a day (BID) | ORAL | Status: DC
  Filled 2013-11-04: qty 1

## 2013-11-04 MED ORDER — IPRATROPIUM-ALBUTEROL 0.5-2.5 (3) MG/3ML IN SOLN
3.0000 mL | RESPIRATORY_TRACT | Status: DC
  Administered 2013-11-04 – 2013-11-06 (×8): 3 mL via RESPIRATORY_TRACT
  Filled 2013-11-04 (×8): qty 3

## 2013-11-04 MED ORDER — ALLOPURINOL 100 MG PO TABS
100.0000 mg | ORAL_TABLET | Freq: Two times a day (BID) | ORAL | Status: DC
  Administered 2013-11-05 – 2013-11-11 (×14): 100 mg via ORAL
  Filled 2013-11-04 (×15): qty 1

## 2013-11-04 MED ORDER — SODIUM CHLORIDE 0.9 % IJ SOLN: 3.0000 mL | INTRAMUSCULAR | Status: DC | PRN

## 2013-11-04 MED ORDER — ACETAMINOPHEN 325 MG PO TABS: 650.0000 mg | ORAL_TABLET | ORAL | Status: DC | PRN

## 2013-11-04 MED ORDER — METHYLPREDNISOLONE SODIUM SUCC 40 MG IJ SOLR
40.0000 mg | Freq: Three times a day (TID) | INTRAMUSCULAR | Status: DC
  Administered 2013-11-05 – 2013-11-06 (×4): 40 mg via INTRAVENOUS
  Filled 2013-11-04 (×8): qty 1

## 2013-11-04 MED ORDER — POTASSIUM CHLORIDE CRYS ER 20 MEQ PO TBCR
20.0000 meq | EXTENDED_RELEASE_TABLET | Freq: Two times a day (BID) | ORAL | Status: DC
  Administered 2013-11-05 (×3): 20 meq via ORAL
  Filled 2013-11-04 (×5): qty 1

## 2013-11-04 MED ORDER — IPRATROPIUM BROMIDE 0.02 % IN SOLN
0.5000 mg | RESPIRATORY_TRACT | Status: DC
  Filled 2013-11-04: qty 2.5

## 2013-11-04 MED ORDER — OSELTAMIVIR PHOSPHATE 30 MG PO CAPS
30.0000 mg | ORAL_CAPSULE | Freq: Two times a day (BID) | ORAL | Status: DC
  Administered 2013-11-05 (×3): 30 mg via ORAL
  Filled 2013-11-04 (×5): qty 1

## 2013-11-04 MED ORDER — SODIUM CHLORIDE 0.9 % IJ SOLN
3.0000 mL | Freq: Two times a day (BID) | INTRAMUSCULAR | Status: DC
  Administered 2013-11-04 – 2013-11-11 (×11): 3 mL via INTRAVENOUS

## 2013-11-04 NOTE — ED Notes (Signed)
Head cold and cough x2 days.   SOB today with wheezing.

## 2013-11-04 NOTE — ED Notes (Signed)
Patient transported to X-ray 

## 2013-11-04 NOTE — ED Provider Notes (Signed)
CSN: [id number]     Arrival date & time 11/04/13  1715 History   First MD Initiated Contact with Patient 11/04/13 1830     Chief Complaint  Patient presents with  . Shortness of Breath   (Consider location/radiation/quality/duration/timing/severity/associated sxs/prior Treatment) Patient is a 73 y.o. female presenting with shortness of breath. The history is provided by the patient and a relative. No language interpreter was used.  Shortness of Breath Severity:  Moderate Onset quality:  Gradual Duration:  2 days Timing:  Constant Progression:  Worsening Chronicity:  New Relieved by:  Nothing Worsened by:  Exertion and movement Ineffective treatments:  None tried Associated symptoms: abdominal pain, cough, fever and wheezing   Associated symptoms: no chest pain, no diaphoresis, no headaches, no neck pain, no sore throat, no sputum production, no syncope and no vomiting   Abdominal pain:    Location:  Generalized   Quality:  Dull   Severity:  Mild   Onset quality:  Unable to specify   Duration:  1 day   Timing:  Intermittent   Progression:  Waxing and waning   Chronicity:  New Cough:    Cough characteristics:  Non-productive   Sputum characteristics:  Nondescript   Severity:  Moderate   Onset quality:  Gradual   Duration:  2 days   Timing:  Constant   Progression:  Worsening   Chronicity:  New Fever:    Duration:  2 days   Timing:  Intermittent   Temp source:  Subjective   Progression:  Waxing and waning Wheezing:    Severity:  Moderate   Onset quality:  Gradual   Duration:  2 days   Timing:  Constant   Progression:  Worsening   Chronicity:  New Risk factors: obesity   Risk factors: no prolonged immobilization, no recent surgery and no tobacco use     Past Medical History  Diagnosis Date  . History of ventricular tachycardia   . Peripheral edema   . Aortic insufficiency     Moderate 2012  . History of gout   . Exogenous obesity   . History of CHF  (congestive heart failure)     EF 45% 2012, nonischemic (Normal coronaries 2002)  . 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 Hypertension   . 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 ICD (implantable cardiac defibrillator) in place 2012  . DOE (dyspnea on exertion)   . Stroke 2000  . Renal insufficiency   . Myocardial infarction 2000  . Anxiety   . 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 Anemia   . Presence of IVC filter 2000  . Osteopenia 09/01/2013   Past Surgical History  Procedure Laterality Date  . Cardiac defibrillator placement    . Cataract extraction    . Embolectomy  02/15/2012    Procedure: EMBOLECTOMY;  Surgeon: [name], MD;  Location: [facility] OR;  Service: Vascular;  Laterality: Left;  left popliteal embolectomy with drain placement  . Insert / replace / remove pacemaker    . Eye surgery  2012    removal of cataracts bilaterally  . Cardiac catheterization    . Tonsillectomy    . Aortic valve replacement  04/12/2012    Procedure: AORTIC VALVE REPLACEMENT (AVR);  Surgeon: Delight Ovens, MD;  Location: [facility] OR;  Service: Open Heart Surgery;  Laterality: N/A;  with nitric oxide  .  Thoracic aortic aneurysm repair  04/12/2012    Procedure: THORACIC ASCENDING ANEURYSM REPAIR (AAA);  Surgeon: Delight OvensEdward B Gerhardt, MD;  [facility];  Service: Open Heart Surgery;  Laterality: N/A;  . Clot removal  5.2013    blood clot removed from the back of the left leg  . Aortic valve surgery  7.2013   Family History  Problem Relation Age of Onset  . Hypertension Father   . Cancer Father   . Coronary artery disease Father [id number]  . Cancer Son     pancreas, spine   History  Substance Use Topics  . Smoking status: Never Smoker   . Smokeless tobacco: Never Used  . Alcohol Use: No   OB History   Grav Para Term Preterm Abortions TAB SAB Ect Mult Living                 Review  of Systems  Constitutional: Positive for fever. Negative for chills, diaphoresis, activity change, appetite change and fatigue.  HENT: Negative for congestion, facial swelling, rhinorrhea and sore throat.   Eyes: Negative for photophobia and discharge.  Respiratory: Positive for cough, shortness of breath and wheezing. Negative for sputum production and chest tightness.   Cardiovascular: Negative for chest pain, palpitations, leg swelling and syncope.  Gastrointestinal: Positive for abdominal pain. Negative for nausea, vomiting and diarrhea.  Endocrine: Negative for polydipsia and polyuria.  Genitourinary: Negative for dysuria, frequency, difficulty urinating and pelvic pain.  Musculoskeletal: Negative for arthralgias, back pain, neck pain and neck stiffness.  Skin: Negative for color change and wound.  Allergic/Immunologic: Negative for immunocompromised state.  Neurological: Negative for facial asymmetry, weakness, numbness and headaches.  Hematological: Does not bruise/bleed easily.  Psychiatric/Behavioral: Negative for confusion and agitation.    Allergies  Adhesive; Latex; and Neomycin-bacitracin zn-polymyx  Home Medications   Current Outpatient Rx  Name  Route  Sig  Dispense  Refill  . allopurinol (ZYLOPRIM) 100 MG tablet      TAKE 2 TABLETS BY MOUTH EVERY DAY   60 tablet   3     .[name].Patient needs to contact office to schedule  App ...   . Biotin 5000 MCG CAPS   Oral   Take 1 capsule (5,000 mcg total) by mouth daily.   30 capsule   6   . Bisacodyl (DULCOLAX PO)   Oral   Take 1 tablet by mouth daily.         . Calcium Carbonate-Vit D-Min (CALTRATE 600+D PLUS MINERALS) 600-800 MG-UNIT TABS   Oral   Take 1 tablet by mouth 2 (two) times daily.         . Calcium Carbonate-Vitamin D (CALTRATE 600+D) 600-400 MG-UNIT per tablet   Oral   Take 1 tablet by mouth 2 (two) times daily.         . carvedilol (COREG) 12.5 MG tablet      TAKE 1 TABLET BY MOUTH TWICE A DAY  WITH MEALS   60 tablet   4   . chlorpheniramine-HYDROcodone (TUSSIONEX) 10-8 MG/5ML LQCR   Oral   Take 5 mLs by mouth every 12 (twelve) hours as needed.   155 mL   0   . colchicine 0.6 MG tablet      Take 2 tabs by mouth and 1 tab one hour later.   6 tablet   0   . CRESTOR 5 MG tablet      TAKE 1 TABLET (5 MG TOTAL) BY MOUTH DAILY.   30 tablet  3   . furosemide (LASIX) 40 MG tablet   Oral   Take 40 mg by mouth as directed. 2 in the morning and 1 in the evening         . NITROSTAT 0.4 MG SL tablet   Sublingual   Place 0.4 mg under the tongue every 5 (five) minutes as needed. For chest pain         . potassium chloride SA (KLOR-CON M20) 20 MEQ tablet      one tablet PO bid         . warfarin (COUMADIN) 5 MG tablet      TAKE 1 TABLET EVERY EVENING   30 tablet   3    BP 176/115  Pulse 106  Temp(Src) 97.7 F (36.5 C) (Oral)  Resp 24  Ht 5\' 2"  (1.575 m)  Wt 190 lb (86.183 kg)  BMI 34.74 kg/m2  SpO2 98% Physical Exam  Constitutional: She is oriented to person, place, and time. She appears well-developed and well-nourished. No distress.  HENT:  Head: Normocephalic and atraumatic.  Mouth/Throat: No oropharyngeal exudate.  Eyes: Pupils are equal, round, and reactive to light.  Neck: Normal range of motion. Neck supple.  Cardiovascular: Normal rate, regular rhythm and normal heart sounds.  Exam reveals no gallop and no friction rub.   No murmur heard. Pulmonary/Chest: Effort normal and breath sounds normal. No respiratory distress. She has no wheezes. She has no rales.  Abdominal: Soft. Bowel sounds are normal. She exhibits no distension and no mass. There is no tenderness. There is no rebound and no guarding.  Musculoskeletal: Normal range of motion. She exhibits no edema and no tenderness.  Neurological: She is alert and oriented to person, place, and time.  Skin: Skin is warm and dry.  Psychiatric: She has a normal mood and affect.    ED Course   Procedures (including critical care time) Labs Review Labs Reviewed  CBC WITH DIFFERENTIAL - Abnormal; Notable for the following:    Monocytes Relative 21 (*)    Monocytes Absolute 1.6 (*)    All other components within normal limits  COMPREHENSIVE METABOLIC PANEL - Abnormal; Notable for the following:    Creatinine, Ser 1.30 (*)    GFR calc non Af Amer 40 (*)    GFR calc Af Amer 47 (*)    All other components within normal limits  PRO B NATRIURETIC PEPTIDE - Abnormal; Notable for the following:    Pro B Natriuretic peptide (BNP) 3503.0 (*)    All other components within normal limits  PROTIME-INR - Abnormal; Notable for the following:    Prothrombin Time 19.1 (*)    INR 1.66 (*)    All other components within normal limits  POCT I-STAT 3, BLOOD GAS (G3+) - Abnormal; Notable for the following:    Bicarbonate 27.6 (*)    Acid-Base Excess 3.0 (*)    All other components within normal limits  TROPONIN I   Imaging Review Dg Chest 2 View  11/04/2013   CLINICAL DATA:  Shortness of breath  EXAM: CHEST  2 VIEW  COMPARISON:  DG CHEST 2 VIEW dated 08/08/2012  FINDINGS: Low lung volumes. Patient is status post median sternotomy. A left chest wall AICD single chamber is appreciated. Lead tips project in the region of the right ventricle. A prosthetic aortic valve is identified. There is prominence of the interstitial markings, and a component of peribronchial cuffing. Increased bilateral perihilar densities appreciated. No focal regions of consolidation appreciated.  IMPRESSION: Bilateral interstitial infiltrates is may reflect sequela pulmonary edema. An infectious or inflammatory etiology is also a diagnostic consideration clinically appropriate. No focal regions consolidation appreciated.   Electronically Signed   By: Salome Holmes M.D.   On: 11/04/2013 17:52    EKG Interpretation    Date/Time:  Friday November 04 2013 18:12:54 EST Ventricular Rate:  90 PR Interval:    QRS  Duration: 114 QT Interval:  386 QTC Calculation: 472 R Axis:   -74 Text Interpretation:  Atrial fibrillation Left anterior fascicular block Septal infarct , age undetermined Lateral infarct , age undetermined Marked ST abnormality, possible inferior subendocardial injury Abnormal ECG Confirmed by DOCHERTY  MD, MEGAN ([id number]) on 11/04/2013 6:53:25 PM            MDM   1. Acute exacerbation of CHF (congestive heart failure)    Pt is a 73 y.o. female with Pmhx as above who presents with 2 days of cough, congestion, subjective fever, chills, SOB, wheezing, inc LE edema.  On PE, pt is tachypnemic, with inc WOB.  She denies CP, she has had mild epigastric pain. Can speak 1 sentence at a time.  She is in rate controlled afib, 100% on 2L by [place].  She has scattered wheezing and rhonchi throughout, mild BLLE edema.  CXR from triage w/ increases perihilar densities (pulm edema vs infection vs inflammation). EKG with ST depression in inferior leads. Infections cause of symptoms less likely given nml WBC and lack of fever.  Suspect CHF excerebration given elevated BNP, inc LE edema, O2 requirement.  80mg  IV lasix given. Pt will be transferred to step down at [facility], Dr. Wyline Mood.        Shanna Cisco, MD 11/04/13 2013

## 2013-11-04 NOTE — ED Notes (Signed)
MD at bedside. 

## 2013-11-04 NOTE — H&P (Signed)
Admit date: 11/04/2013 Referring Physician [facility] Primary Cardiologist Dr. 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 Tyrone SageGerhardt. 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 She also has a dilated left ventricle with marked decrease in left ventricular systolic function. She is now on carvedilol 6.25 mg by mouth twice a day. Her symptoms of congestive heart failure have improved significantly since surgery. She is no longer having any ankle edema or significant orthopnea.  She now [place] and her daughter was recently diagnosed with influenza.   She has a history of chronic atrial fibrillation and is now on Coumadin. She was in her USOH until about 3 days ago when she started having subjective fevers, chills and cough that was productive but she could not cough it up to see the color.  She then started developing SOB.  [facility] where a chest xray showed bilateral interstitial infiltrates felt secondary to pulmonary edema or an infectious or inflammatory etiology with no focal regions consolidation.  Her BNP was elevated at 3000.  [facility] is [facility].  She received albuterol nebs  In the ER.      PMH:    Past Medical History  Diagnosis Date  . History of ventricular tachycardia   . Peripheral edema   . Aortic insufficiency     Moderate 2012  . History of gout   . Exogenous obesity   . History of CHF (congestive heart failure)     EF 45% 2012, nonischemic (Normal coronaries 2002)  . 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 Hypertension   . A-fib     Now on coumadin with remote intracebral hemorrhage  . ICD (implantable cardiac  defibrillator) in place 2012  . DOE (dyspnea on exertion)   . Stroke 2000  . Renal insufficiency   . Anxiety   . 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 Anemia   . Presence of IVC filter 2000  . Osteopenia 09/01/2013  . Coronary artery disease   . Myocardial infarction 2000  . Pacemaker   . CHF (congestive heart failure)     PSH:    Past Surgical History  Procedure Laterality Date  . Cardiac defibrillator placement    . Cataract extraction    . Embolectomy  02/15/2012    Procedure: EMBOLECTOMY;  Surgeon: Chuck Hinthristopher S Dickson, MD;  [facility];  Service: Vascular;  Laterality: Left;  left popliteal embolectomy with drain placement  . Insert / replace / remove pacemaker    . Eye surgery  2012    removal of cataracts bilaterally  . Cardiac catheterization    . Tonsillectomy    . Aortic valve replacement  04/12/2012    Procedure: AORTIC VALVE REPLACEMENT (AVR);  Surgeon: Delight OvensEdward B Gerhardt, MD;  [facility];  Service: Open Heart Surgery;  Laterality: N/A;  with nitric oxide  . Thoracic aortic aneurysm repair  04/12/2012    Procedure: THORACIC ASCENDING ANEURYSM REPAIR (AAA);  Surgeon: Delight OvensEdward B Gerhardt, MD;  [facility];  Service: Open Heart Surgery;  Laterality: N/A;  . Clot removal  5.2013    blood clot removed from the back of  the left leg  . Aortic valve surgery  7.2013  . Cardiac valve replacement      ALLERGIES:   Adhesive; Latex; and Neomycin-bacitracin zn-polymyx  Prior to Admit Meds:   Prescriptions prior to admission  Medication Sig Dispense Refill  . allopurinol (ZYLOPRIM) 100 MG tablet TAKE 2 TABLETS BY MOUTH EVERY DAY  60 tablet  3  . Biotin 5000 MCG CAPS Take 1 capsule (5,000 mcg total) by mouth daily.  30 capsule  6  . Bisacodyl (DULCOLAX PO) Take 1 tablet by mouth daily.      . Calcium Carbonate-Vit D-Min (CALTRATE 600+D PLUS MINERALS) 600-800 MG-UNIT TABS Take 1 tablet by mouth 2 (two) times daily.      .  Calcium Carbonate-Vitamin D (CALTRATE 600+D) 600-400 MG-UNIT per tablet Take 1 tablet by mouth 2 (two) times daily.      . carvedilol (COREG) 12.5 MG tablet TAKE 1 TABLET BY MOUTH TWICE A DAY WITH MEALS  60 tablet  4  . chlorpheniramine-HYDROcodone (TUSSIONEX) 10-8 MG/5ML LQCR Take 5 mLs by mouth every 12 (twelve) hours as needed.  155 mL  0  . colchicine 0.6 MG tablet Take 2 tabs by mouth and 1 tab one hour later.  6 tablet  0  . CRESTOR 5 MG tablet TAKE 1 TABLET (5 MG TOTAL) BY MOUTH DAILY.  30 tablet  3  . furosemide (LASIX) 40 MG tablet Take 40 mg by mouth as directed. 2 in the morning and 1 in the evening      . NITROSTAT 0.4 MG SL tablet Place 0.4 mg under the tongue every 5 (five) minutes as needed. For chest pain      . potassium chloride SA (KLOR-CON M20) 20 MEQ tablet one tablet PO bid      . warfarin (COUMADIN) 5 MG tablet TAKE 1 TABLET EVERY EVENING  30 tablet  3   Family HX:    Family History  Problem Relation Age of Onset  . Hypertension Father   . Cancer Father   . Coronary artery disease Father 86  . Cancer Son     pancreas, spine   Social HX:    History   Social History  . Marital Status: Divorced    Spouse Name: N/A    Number of Children: N/A  . Years of Education: N/A   Occupational History  . Not on file.   Social History Main Topics  . Smoking status: Never Smoker   . Smokeless tobacco: Never Used  . Alcohol Use: No  . Drug Use: No  . Sexual Activity: No   Other Topics Concern  . Not on file   Social History Narrative   Lives with daughter.       ROS:  All 11 ROS were addressed and are negative except what is stated in the HPI  PHYSICAL EXAM Filed Vitals:   11/04/13 2230  BP: 182/129  Pulse: 84  Temp:   Resp: 29   General: Well developed, well nourished, in no acute distress Head: Eyes PERRLA, No xanthomas.   Normal cephalic and atramatic  Lungs:  Very harsh and productive cough with diffuse wheezing and rhonchi throughout Heart:   HRRR  S1 S2 Pulses are 2+ & equal.            No carotid bruit. No JVD.  No abdominal bruits. No femoral bruits. Abdomen: Bowel sounds are positive, abdomen soft and non-tender without masses Extremities:   No clubbing, cyanosis or edema.  DP +  1 Neuro: Alert and oriented X 3. Psych:  Good affect, responds appropriately   Labs:   Lab Results  Component Value Date   WBC 7.7 11/04/2013   HGB 14.5 11/04/2013   HCT 44.4 11/04/2013   MCV 95.1 11/04/2013   PLT 156 11/04/2013    Recent Labs Lab 11/04/13 1824  NA 140  K 4.4  CL 99  CO2 24  BUN 21  CREATININE 1.30*  CALCIUM 9.2  PROT 8.1  BILITOT 0.5  ALKPHOS 86  ALT 12  AST 24  GLUCOSE 95   Lab Results  Component Value Date   CKTOTAL 38 04/06/2012   CKMB 1.8 04/06/2012   TROPONINI <0.30 11/04/2013   No results found for this basename: PTT   Lab Results  Component Value Date   INR 1.66* 11/04/2013   INR 2.2 10/17/2013   INR 1.8 09/26/2013     Lab Results  Component Value Date   CHOL 182 07/11/2013   CHOL 174 12/29/2012   CHOL 265* 09/02/2012   Lab Results  Component Value Date   HDL 73 07/11/2013   HDL 62.20 12/29/2012   HDL 74 09/02/2012   Lab Results  Component Value Date   LDLCALC 83 07/11/2013   LDLCALC 90 12/29/2012   LDLCALC 177* 09/02/2012   Lab Results  Component Value Date   TRIG 132 07/11/2013   TRIG 108.0 12/29/2012   TRIG 71 09/02/2012   Lab Results  Component Value Date   CHOLHDL 2.5 07/11/2013   CHOLHDL 3 12/29/2012   CHOLHDL 3.6 09/02/2012   Lab Results  Component Value Date   LDLDIRECT 152.6 09/01/2011         Radiology:  CLINICAL DATA: Shortness of breath  EXAM:  CHEST 2 VIEW  COMPARISON: DG CHEST 2 VIEW dated 08/08/2012  FINDINGS:  Low lung volumes. Patient is status post median sternotomy. A left  chest wall AICD single chamber is appreciated. Lead tips project in  the region of the right ventricle. A prosthetic aortic valve is  identified. There is prominence of the interstitial markings, and a    component of peribronchial cuffing. Increased bilateral perihilar  densities appreciated. No focal regions of consolidation  appreciated.  IMPRESSION:  Bilateral interstitial infiltrates is may reflect sequela pulmonary  edema. An infectious or inflammatory etiology is also a diagnostic  consideration clinically appropriate. No focal regions consolidation  appreciated.    EKG:  Atrial fibrillation with septal infarct, LAFB, inferolatera ST/T wave abnormality  ASSESSMENT:  1.  Acute Respiratory Failure secondary to acute systolic CHF +/- influenza/PNA 2.  Acute on chronic systolic CHF possibly due to hypertensive urgency and stress of acute infectious pulmonary process 3.  ? Influenza vs PNA with 3 day history of subjective fever, chills, cough and body aches with chest xray showing ? Infectious process.  She has a bad cough that sounds congested but she cannot cough it up.  Her WBC is normal and Tmax 99.4.  She lives with her daughter who was diagnosed with the flu a few days ago. 4.  Hypertensive urgency 5.  Severe AI s/p bioprosthetic AVR 6.  Aortic aneurysm s/p repair 7.  Chronic atrial fibrillation - initially not on coumadin due to remote history of intracerebral bleed but then placed on anticoagulation after acute arterial embolus of LE 8.  DCM s/p AICD 9.  Mild nonobstructive ASCAD 10.  Chronic systemic anticoagulation with subtherapeutic INR  PLAN:   1.  Admit to stepdown unit 2.  Lasix  80mg  IV BID 3.  Continue current home meds 4.  Tamiflu 75mg  BID 5.  Check influenza PCR 6.  Hospitalist consult for acute pulmonary process 7.  Coumadin management per pharmacy 8.  Hold beta blocker due to active wheezing 9.  IV NTG for acute BP management until we can put her back on beta blockers.  Will not use Cardizem gtt for BP control due to history of severe LV dysfunction  Quintella Reichert, MD  11/04/2013  10:52 PM

## 2013-11-04 NOTE — Progress Notes (Signed)
ANTICOAGULATION CONSULT NOTE - Initial Consult  Pharmacy Consult for Warfarin  Indication: AVR, afib  Allergies  Allergen Reactions  . Adhesive [Tape] Other (See Comments)    Redness and skin breaks down  . Latex Rash  . Neomycin-Bacitracin Zn-Polymyx Rash   Patient Measurements: Height: 5\' 1"  (154.9 cm) Weight: 185 lb 3 oz (84 kg) IBW/kg (Calculated) : 47.8  Vital Signs: Temp: 98.1 F (36.7 C) (02/06 2130) Temp src: Oral (02/06 2130) BP: 182/129 mmHg (02/06 2230) Pulse Rate: 84 (02/06 2230)  Labs:  Recent Labs  11/04/13 1824  HGB 14.5  HCT 44.4  PLT 156  LABPROT 19.1*  INR 1.66*  CREATININE 1.30*  TROPONINI <0.30   Estimated Creatinine Clearance: 39 ml/min (by C-G formula based on Cr of 1.3).  Medical History: Past Medical History  Diagnosis Date  . History of ventricular tachycardia   . Peripheral edema   . Aortic insufficiency     Moderate 2012  . History of gout   . Exogenous obesity   . History of CHF (congestive heart failure)     EF 45% 2012, nonischemic (Normal coronaries 2002)  . 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 Hypertension   . 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 ICD (implantable cardiac defibrillator) in place 2012  . DOE (dyspnea on exertion)   . Stroke 2000  . Renal insufficiency   . Anxiety   . 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 Anemia   . Presence of IVC filter 2000  . Osteopenia 09/01/2013  . Coronary artery disease   . Myocardial infarction 2000  . Pacemaker   . CHF (congestive heart failure)    Medications:  Warfarin 5 mg on Tues/Sat, 2.5 mg all other days  Assessment: 73 y/o F here with pulmonary edema vs PNA, on chronic warfarin for afib/AVR, INR 1.66 on admit, other labs as above.   Goal of Therapy:  INR 2-3 Monitor platelets by anticoagulation protocol: Yes   Plan:  -Warfarin 4 mg PO x 1 NOW  -Daily  PT/INR -Monitor for bleeding  Abran Duke 11/04/2013,11:32 PM

## 2013-11-04 NOTE — ED Notes (Signed)
Pt used bedside commode.  Urine emptied.

## 2013-11-04 NOTE — Consult Note (Signed)
Patient's PCP: Lemont Fillers'SULLIVAN,MELISSA S., NP  Consulting physician: Dr. Mayford Knifeurner  Chief Complaint: Shortness of breath  History of Present Illness: [name] is a 73 y.o. 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 Tyrone SageGerhardt, bioprosthetic valve, A. fib on chronic anticoagulation, chronic systolic congestive heart failure EF of 25-30% based on a 2-D echocardiogram in 08/17/2012 with diffuse hypokinesis and history of gout who presents with the above complaints.  Patient reports that recently her daughter was diagnosed with flu, on 10/31/2013 and has been out of work till 11/02/2013.  Patient herself started feeling short of breath on 11/01/2013 with nonproductive cough, generalized malaise, and feeling feverish at home.  [facility] and was transferred to cardiology service for further evaluation.  Dr. Mayford Knifeurner, consulted hospitalist service for further evaluation.  Patient denies any chest pain, abdominal pain, diarrhea headaches or vision changes.  Review of Systems: All systems reviewed with the patient and positive as per history of present illness, otherwise all other systems are negative.  Past Medical History  Diagnosis Date  . History of ventricular tachycardia   . Peripheral edema   . Aortic insufficiency     Moderate 2012  . History of gout   . Exogenous obesity   . History of CHF (congestive heart failure)     EF 45% 2012, nonischemic (Normal coronaries 2002)  . 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 Hypertension   . 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 ICD (implantable cardiac defibrillator) in place 2012  . DOE (dyspnea on exertion)   . Stroke 2000  . Renal insufficiency   . Anxiety   . 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 Anemia   . Presence of IVC filter 2000   . Osteopenia 09/01/2013  . Coronary artery disease   . Myocardial infarction 2000  . Pacemaker   . CHF (congestive heart failure)    Past Surgical History  Procedure Laterality Date  . Cardiac defibrillator placement    . Cataract extraction    . Embolectomy  02/15/2012    Procedure: EMBOLECTOMY;  Surgeon: Chuck Hinthristopher S Dickson, MD;  [facility];  Service: Vascular;  Laterality: Left;  left popliteal embolectomy with drain placement  . Insert / replace / remove pacemaker    . Eye surgery  2012    removal of cataracts bilaterally  . Cardiac catheterization    . Tonsillectomy    . Aortic valve replacement  04/12/2012    Procedure: AORTIC VALVE REPLACEMENT (AVR);  Surgeon: Delight OvensEdward B Gerhardt, MD;  [facility];  Service: Open Heart Surgery;  Laterality: N/A;  with nitric oxide  . Thoracic aortic aneurysm repair  04/12/2012    Procedure: THORACIC ASCENDING ANEURYSM REPAIR (AAA);  Surgeon: Delight OvensEdward B Gerhardt, MD;  [facility];  Service: Open Heart Surgery;  Laterality: N/A;  . Clot removal  5.2013    blood clot removed from the back of the left leg  . Aortic valve surgery  7.2013  . Coronary artery bypass graft  2014  . Cardiac valve replacement     Family History  Problem Relation Age of Onset  . Hypertension Father   . Cancer Father   . Coronary artery disease Father [id number]  . Cancer Son     pancreas, spine   History  Social History  . Marital Status: Divorced    Spouse Name: N/A    Number of Children: N/A  . Years of Education: N/A   Occupational History  . Not on file.   Social History Main Topics  . Smoking status: Never Smoker   . Smokeless tobacco: Never Used  . Alcohol Use: No  . Drug Use: No  . Sexual Activity: No   Other Topics Concern  . Not on file   Social History Narrative   Lives with daughter.     Allergies: Adhesive; Latex; and Neomycin-bacitracin zn-polymyx  Home Meds: Prior to Admission medications   Medication Sig Start Date End Date  Taking? Authorizing Provider  allopurinol (ZYLOPRIM) 100 MG tablet TAKE 2 TABLETS BY MOUTH EVERY DAY 08/30/13   [name], MD  Biotin 5000 MCG CAPS Take 1 capsule (5,000 mcg total) by mouth daily. 09/02/12   Edwyna Perfect, MD  Bisacodyl (DULCOLAX PO) Take 1 tablet by mouth daily.    Historical Provider, MD  Calcium Carbonate-Vit D-Min (CALTRATE 600+D PLUS MINERALS) 600-800 MG-UNIT TABS Take 1 tablet by mouth 2 (two) times daily.    Historical Provider, MD  Calcium Carbonate-Vitamin D (CALTRATE 600+D) 600-400 MG-UNIT per tablet Take 1 tablet by mouth 2 (two) times daily.    Historical Provider, MD  carvedilol (COREG) 12.5 MG tablet TAKE 1 TABLET BY MOUTH TWICE A DAY WITH MEALS 07/04/13   [name], MD  chlorpheniramine-HYDROcodone (TUSSIONEX) 10-8 MG/5ML LQCR Take 5 mLs by mouth every 12 (twelve) hours as needed. 09/14/13   Sandford Craze, NP  colchicine 0.6 MG tablet Take 2 tabs by mouth and 1 tab one hour later. 09/19/13   Sandford Craze, NP  CRESTOR 5 MG tablet TAKE 1 TABLET (5 MG TOTAL) BY MOUTH DAILY. 08/30/13   Sandford Craze, NP  furosemide (LASIX) 40 MG tablet Take 40 mg by mouth as directed. 2 in the morning and 1 in the evening 07/22/13 07/22/14  Sandford Craze, NP  NITROSTAT 0.4 MG SL tablet Place 0.4 mg under the tongue every 5 (five) minutes as needed. For chest pain 02/29/12   Historical Provider, MD  potassium chloride SA (KLOR-CON M20) 20 MEQ tablet one tablet PO bid 04/12/13   [name], MD  warfarin (COUMADIN) 5 MG tablet TAKE 1 TABLET EVERY EVENING 08/01/13   [name], MD    Physical Exam: Blood pressure 182/129, pulse 84, temperature 98.1 F (36.7 C), temperature source Oral, resp. rate 29, height 5\' 1"  (1.549 m), weight 84 kg (185 lb 3 oz), SpO2 99.00%. General: Awake, Oriented x3, No acute distress. HEENT: EOMI, Moist mucous membranes Neck: Supple CV: S1 and S2 Lungs: Scattered in upper expiratory wheezing bilaterally, moderate air  movement, did not appreciate any crackles. Abdomen: Soft, Nontender, Nondistended, +bowel sounds. Ext: Good pulses. Trace edema. No clubbing or cyanosis noted. Neuro: Cranial Nerves II-XII grossly intact. Has 5/5 motor strength in upper and lower extremities.  Lab results:  Recent Labs  11/04/13 1824  NA 140  K 4.4  CL 99  CO2 24  GLUCOSE 95  BUN 21  CREATININE 1.30*  CALCIUM 9.2    Recent Labs  11/04/13 1824  AST 24  ALT 12  ALKPHOS 86  BILITOT 0.5  PROT 8.1  ALBUMIN 4.0   No results found for this basename: LIPASE, AMYLASE,  in the last 72 hours  Recent Labs  11/04/13 1824  WBC 7.7  NEUTROABS 4.7  HGB 14.5  HCT 44.4  MCV 95.1  PLT 156    Recent Labs  11/04/13 1824  TROPONINI <0.30   No components found with this basename: POCBNP,  No results found for this basename: DDIMER,  in the last 72 hours No results found for this basename: HGBA1C,  in the last 72 hours No results found for this basename: CHOL, HDL, LDLCALC, TRIG, CHOLHDL, LDLDIRECT,  in the last 72 hours No results found for this basename: TSH, T4TOTAL, FREET3, T3FREE, THYROIDAB,  in the last 72 hours No results found for this basename: VITAMINB12, FOLATE, FERRITIN, TIBC, IRON, RETICCTPCT,  in the last 72 hours Imaging results:  Dg Chest 2 View  11/04/2013   CLINICAL DATA:  Shortness of breath  EXAM: CHEST  2 VIEW  COMPARISON:  DG CHEST 2 VIEW dated 08/08/2012  FINDINGS: Low lung volumes. Patient is status post median sternotomy. A left chest wall AICD single chamber is appreciated. Lead tips project in the region of the right ventricle. A prosthetic aortic valve is identified. There is prominence of the interstitial markings, and a component of peribronchial cuffing. Increased bilateral perihilar densities appreciated. No focal regions of consolidation appreciated.  IMPRESSION: Bilateral interstitial infiltrates is may reflect sequela pulmonary edema. An infectious or inflammatory etiology is also a  diagnostic consideration clinically appropriate. No focal regions consolidation appreciated.   Electronically Signed   By: Salome Holmes M.D.   On: 11/04/2013 17:52   Other results: EKG: Atrial fibrillation, rate controlled.  Assessment & Plan by Problem: Acute respiratory failure Etiology unclear, suspect multifactorial due to acute on chronic systolic congestive heart failure and probably upper respiratory viral illness.  Patient's acute on chronic congestive heart failure is being managed by cardiology.  Given patient's recent exposure to viral illness, start empiric Tamiflu and check influenza PCR.  If any fevers, low threshold to starting the patient on antibiotics.  As patient has wheezing, start empiric Solu-Medrol.  Suspect patient likely has reactive airway disease from viral illness.  Defined short course of steroids.  Continue breathing treatments with nebulizers.  Acute on chronic systolic congestive heart failure Management as per cardiology.  Continue diuresis on Lasix.  Patient's carvedilol held likely due to wheezing.  Not on ACE inhibitor or ARB, likely due to chronic kidney disease stage III.  Uncontrolled hypertension Management as per cardiology.  Started on nitroglycerin drip.  Chronic kidney disease stage III Stable.  Hyperlipidemia Stable.  Atrial fibrillation on chronic anticoagulation Rate controlled.  Continue Coumadin.  Thank you for the consult.  Will continue to follow.  Jeffre Enriques A, MD 11/04/2013, 11:24 PM

## 2013-11-05 DIAGNOSIS — N289 Disorder of kidney and ureter, unspecified: Secondary | ICD-10-CM

## 2013-11-05 DIAGNOSIS — I517 Cardiomegaly: Secondary | ICD-10-CM

## 2013-11-05 LAB — TROPONIN I: Troponin I: <0.3 ng/mL (ref <0.30)

## 2013-11-05 LAB — BASIC METABOLIC PANEL
BUN: 24 mg/dL — ABNORMAL HIGH (ref 6–23)
BUN: 29 mg/dL — ABNORMAL HIGH (ref 6–23)
CALCIUM: 8.8 mg/dL (ref 8.4–10.5)
CHLORIDE: 98 meq/L (ref 96–112)
CHLORIDE: 96 meq/L (ref 96–112)
CO2: 23 mEq/L (ref 19–32)
CO2: 23 meq/L (ref 19–32)
CREATININE: 1.29 mg/dL — AB (ref 0.50–1.10)
CREATININE: 1.4 mg/dL — AB (ref 0.50–1.10)
Calcium: 8.8 mg/dL (ref 8.4–10.5)
GFR calc Af Amer: 47 mL/min — ABNORMAL LOW (ref >90)
GFR calc Af Amer: 43 mL/min — ABNORMAL LOW (ref >90)
GFR calc non Af Amer: 37 mL/min — ABNORMAL LOW (ref >90)
GFR, EST NON AFRICAN AMERICAN: 41 mL/min — AB (ref >90)
GLUCOSE: 173 mg/dL — AB (ref 70–99)
Glucose, Bld: 121 mg/dL — ABNORMAL HIGH (ref 70–99)
Potassium: 4.5 mEq/L (ref 3.7–5.3)
Potassium: 4.3 mEq/L (ref 3.7–5.3)
Sodium: 141 mEq/L (ref 137–147)
Sodium: 140 mEq/L (ref 137–147)

## 2013-11-05 LAB — COMPREHENSIVE METABOLIC PANEL
ALBUMIN: 3.7 g/dL (ref 3.5–5.2)
ALT: 12 U/L (ref 0–35)
AST: 22 U/L (ref 0–37)
Alkaline Phosphatase: 84 U/L (ref 39–117)
BILIRUBIN TOTAL: 0.6 mg/dL (ref 0.3–1.2)
BUN: 21 mg/dL (ref 6–23)
CO2: 27 mEq/L (ref 19–32)
Calcium: 8.7 mg/dL (ref 8.4–10.5)
Chloride: 96 mEq/L (ref 96–112)
Creatinine, Ser: 1.35 mg/dL — ABNORMAL HIGH (ref 0.50–1.10)
GFR calc Af Amer: 45 mL/min — ABNORMAL LOW (ref >90)
GFR calc non Af Amer: 38 mL/min — ABNORMAL LOW (ref >90)
Glucose, Bld: 95 mg/dL (ref 70–99)
Potassium: 3.9 mEq/L (ref 3.7–5.3)
Sodium: 141 mEq/L (ref 137–147)
Total Protein: 8.2 g/dL (ref 6.0–8.3)

## 2013-11-05 LAB — CBC WITH DIFFERENTIAL/PLATELET
BASOS ABS: 0 10*3/uL (ref 0.0–0.1)
BASOS PCT: 1 % (ref 0–1)
Eosinophils Absolute: 0.2 10*3/uL (ref 0.0–0.7)
Eosinophils Relative: 2 % (ref 0–5)
HCT: 44.3 % (ref 36.0–46.0)
Hemoglobin: 15 g/dL (ref 12.0–15.0)
Lymphocytes Relative: 21 % (ref 12–46)
Lymphs Abs: 1.7 10*3/uL (ref 0.7–4.0)
MCH: 32.2 pg (ref 26.0–34.0)
MCHC: 33.9 g/dL (ref 30.0–36.0)
MCV: 95.1 fL (ref 78.0–100.0)
Monocytes Absolute: 1.5 10*3/uL — ABNORMAL HIGH (ref 0.1–1.0)
Monocytes Relative: 19 % — ABNORMAL HIGH (ref 3–12)
NEUTROS ABS: 4.6 10*3/uL (ref 1.7–7.7)
NEUTROS PCT: 58 % (ref 43–77)
Platelets: 161 10*3/uL (ref 150–400)
RBC: 4.66 MIL/uL (ref 3.87–5.11)
RDW: 14 % (ref 11.5–15.5)
WBC: 8.1 10*3/uL (ref 4.0–10.5)

## 2013-11-05 LAB — GLUCOSE, CAPILLARY
Glucose-Capillary: 205 mg/dL — ABNORMAL HIGH (ref 70–99)
Glucose-Capillary: 186 mg/dL — ABNORMAL HIGH (ref 70–99)

## 2013-11-05 LAB — MAGNESIUM: Magnesium: 2.2 mg/dL (ref 1.5–2.5)

## 2013-11-05 LAB — PROTIME-INR
INR: 1.54 — ABNORMAL HIGH (ref 0.00–1.49)
INR: 1.53 — AB (ref 0.00–1.49)
PROTHROMBIN TIME: 18 s — AB (ref 11.6–15.2)
PROTHROMBIN TIME: 18.1 s — AB (ref 11.6–15.2)

## 2013-11-05 LAB — PRO B NATRIURETIC PEPTIDE: PRO B NATRI PEPTIDE: 3807 pg/mL — AB (ref 0–125)

## 2013-11-05 LAB — INFLUENZA PANEL BY PCR (TYPE A & B)
H1N1FLUPCR: NOT DETECTED
Influenza A By PCR: NEGATIVE
Influenza B By PCR: NEGATIVE

## 2013-11-05 LAB — APTT: aPTT: 33 seconds (ref 24–37)

## 2013-11-05 LAB — TSH: TSH: 2.119 u[IU]/mL (ref 0.350–4.500)

## 2013-11-05 MED ORDER — GUAIFENESIN ER 600 MG PO TB12
600.0000 mg | ORAL_TABLET | Freq: Two times a day (BID) | ORAL | Status: DC
  Administered 2013-11-05 – 2013-11-08 (×8): 600 mg via ORAL
  Filled 2013-11-05 (×10): qty 1

## 2013-11-05 MED ORDER — HYDRALAZINE HCL 20 MG/ML IJ SOLN: 10.0000 mg | Freq: Three times a day (TID) | INTRAMUSCULAR | Status: DC | PRN

## 2013-11-05 MED ORDER — INSULIN ASPART 100 UNIT/ML ~~LOC~~ SOLN
0.0000 [IU] | Freq: Three times a day (TID) | SUBCUTANEOUS | Status: DC
  Administered 2013-11-05: 3 [IU] via SUBCUTANEOUS
  Administered 2013-11-06 (×3): 2 [IU] via SUBCUTANEOUS
  Administered 2013-11-07 – 2013-11-09 (×2): 1 [IU] via SUBCUTANEOUS
  Administered 2013-11-10: 2 [IU] via SUBCUTANEOUS
  Administered 2013-11-10 – 2013-11-11 (×2): 1 [IU] via SUBCUTANEOUS
  Administered 2013-11-11: 2 [IU] via SUBCUTANEOUS

## 2013-11-05 MED ORDER — WARFARIN SODIUM 5 MG PO TABS
5.0000 mg | ORAL_TABLET | Freq: Once | ORAL | Status: AC
  Administered 2013-11-05: 5 mg via ORAL
  Filled 2013-11-05: qty 1

## 2013-11-05 MED ORDER — BISOPROLOL FUMARATE 5 MG PO TABS
5.0000 mg | ORAL_TABLET | Freq: Two times a day (BID) | ORAL | Status: DC
  Administered 2013-11-05 – 2013-11-11 (×13): 5 mg via ORAL
  Filled 2013-11-05 (×15): qty 1

## 2013-11-05 NOTE — Progress Notes (Signed)
  Echocardiogram 2D Echocardiogram has been performed.  Claris Pech FRANCES 11/05/2013, 1:13 PM

## 2013-11-05 NOTE — Progress Notes (Addendum)
Patient ID: Heather AmmonsJeritta R [name], female   DOB: 06/05/1941, 73 y.o.   MRN: [id number]    SUBJECTIVE: Stable this morning.  HR in the 90s-100s at rest, increased to 140s with ambulation.  BP remains elevated.  No fever.  Still dyspneic but wheezing improved.   Scheduled Meds: . allopurinol  100 mg Oral BID  . atorvastatin  10 mg Oral q1800  . bisoprolol  5 mg Oral BID  . furosemide  80 mg Intravenous BID  . ipratropium-albuterol  3 mL Nebulization Q4H  . methylPREDNISolone (SOLU-MEDROL) injection  40 mg Intravenous Q8H  . oseltamivir  30 mg Oral BID  . potassium chloride SA  20 mEq Oral BID  . sodium chloride  3 mL Intravenous Q12H  . warfarin  5 mg Oral ONCE-1800  . Warfarin - Pharmacist Dosing Inpatient   Does not apply q1800   Continuous Infusions: . nitroGLYCERIN 5 mcg/min (11/05/13 0500)   PRN Meds:.sodium chloride, acetaminophen, albuterol, ondansetron (ZOFRAN) IV, sodium chloride    Filed Vitals:   11/05/13 0700 11/05/13 0730 11/05/13 0800 11/05/13 0825  BP: 149/102 124/76 112/81   Pulse: 91 76 78   Temp:   97.9 F (36.6 C)   TempSrc:   Oral   Resp: 20 16 17    Height:      Weight:      SpO2: 98% 98% 96% 96%    Intake/Output Summary (Last 24 hours) at 11/05/13 1105 Last data filed at 11/05/13 0700  Gross per 24 hour  Intake  49.07 ml  Output    975 ml  Net -925.93 ml    LABS: Basic Metabolic Panel:  Recent Labs  40/98/1101/04/12 0027 11/05/13 0458  NA 141 141  K 3.9 4.5  CL 96 98  CO2 27 23  GLUCOSE 95 121*  BUN 21 24*  CREATININE 1.35* 1.29*  CALCIUM 8.7 8.8  MG 2.2  --    Liver Function Tests:  Recent Labs  11/04/13 1824 11/05/13 0027  AST 24 22  ALT 12 12  ALKPHOS 86 84  BILITOT 0.5 0.6  PROT 8.1 8.2  ALBUMIN 4.0 3.7   No results found for this basename: LIPASE, AMYLASE,  in the last 72 hours CBC:  Recent Labs  11/04/13 1824 11/05/13 0027  WBC 7.7 8.1  NEUTROABS 4.7 4.6  HGB 14.5 15.0  HCT 44.4 44.3  MCV 95.1 95.1  PLT 156 161    Cardiac Enzymes:  Recent Labs  11/04/13 1824 11/05/13 0045 11/05/13 0458  TROPONINI <0.30 <0.30 <0.30   BNP: No components found with this basename: POCBNP,  D-Dimer: No results found for this basename: DDIMER,  in the last 72 hours Hemoglobin A1C: No results found for this basename: HGBA1C,  in the last 72 hours Fasting Lipid Panel: No results found for this basename: CHOL, HDL, LDLCALC, TRIG, CHOLHDL, LDLDIRECT,  in the last 72 hours Thyroid Function Tests:  Recent Labs  11/05/13 0027  TSH 2.119   Anemia Panel: No results found for this basename: VITAMINB12, FOLATE, FERRITIN, TIBC, IRON, RETICCTPCT,  in the last 72 hours  RADIOLOGY: Dg Chest 2 View  11/04/2013   CLINICAL DATA:  Shortness of breath  EXAM: CHEST  2 VIEW  COMPARISON:  DG CHEST 2 VIEW dated 08/08/2012  FINDINGS: Low lung volumes. Patient is status post median sternotomy. A left chest wall AICD single chamber is appreciated. Lead tips project in the region of the right ventricle. A prosthetic aortic valve is identified. There is prominence of  the interstitial markings, and a component of peribronchial cuffing. Increased bilateral perihilar densities appreciated. No focal regions of consolidation appreciated.  IMPRESSION: Bilateral interstitial infiltrates is may reflect sequela pulmonary edema. An infectious or inflammatory etiology is also a diagnostic consideration clinically appropriate. No focal regions consolidation appreciated.   Electronically Signed   By: Salome Holmes M.D.   On: 11/04/2013 17:52    PHYSICAL EXAM General: NAD Neck: JVP 10 cm, no thyromegaly or thyroid nodule.  Lungs: End expiratory wheezes and crackles at bases bilaterally. CV: Nondisplaced PMI.  Heart tachy, irregular S1/S2, no S3/S4, 1/6 SEM RUSB.  No peripheral edema.   Abdomen: Soft, nontender, no hepatosplenomegaly, no distention.  Neurologic: Alert and oriented x 3.  Psych: Normal affect. Extremities: No clubbing or cyanosis.    TELEMETRY: Reviewed telemetry pt in atrial fibrillation, rate 90s-100s at rest  ASSESSMENT AND PLAN: 73 yo with history of AI s/p bioprosthetic AVR, CKD, cardiomyopathy, and chronic atrial fibrillation presented with acute on chronic systolic CHF and atrial fibrillation with RVR in the setting of possible viral illness.  1. ID: Suspect viral syndrome.  She is afebrile.  Still some end expiratory wheezing on lung exam but think better.  Still awaiting flu test.  - Continue Tamiflu pending return of flu test.  - Continue Solumedrol IV per [facility].  2. CHF: Acute on chronic systolic CHF with volume overload on exam.  Last echo in 11/13 with EF 25-30%.   - Continue Lasix 80 mg IV bid, follow creatinine closely.  - Titrate NTG gtt to keep SBP < 140.  - echo 3. Atrial fibrillation: With RVR,especially when standing.  Avoid diltiazem with low EF.  Will use beta-1 selective bisoprolol at 5 mg bid to try to control HR.  Continue warfarin.  4. CKD: Follow with diuresis.   [name] 11/05/2013 11:10 AM

## 2013-11-05 NOTE — Progress Notes (Addendum)
Patient had 39 beat run of VTach at 0426.  Rhythm self terminated without firing of ICD.  Patient denied 'feeling' rapid rhythm or having any chest pain or SOB. [facility] notified.

## 2013-11-05 NOTE — Progress Notes (Signed)
[facility] PROGRESS NOTE  [name] TDS:[id number] DOB: 01-15-1941 DOA: 11/04/2013 PCP: Lemont Fillers., NP  Assessment/Plan: 1-Acute respiratory failure  Multifactorial due to acute on chronic systolic congestive heart failure and probably upper respiratory viral illness.  Patient's acute on chronic congestive heart failure is being managed by cardiology.  Continue with Tamiflu, although influenza negative.  Continue with Solu-Medrol.  Continue breathing treatments with nebulizers.  Repeat chest x ray tomorrow.   Acute on chronic systolic congestive heart failure  Management as per cardiology. Continue diuresis on Lasix. Patient's carvedilol held likely due to wheezing. Not on ACE inhibitor or ARB, likely due to chronic kidney disease stage III.   Uncontrolled hypertension  Management as per cardiology. on nitroglycerin drip.   Chronic kidney disease stage III  Stable.  Hyperlipidemia  Stable.  Atrial fibrillation on chronic anticoagulation  Rate controlled. Continue Coumadin.  Hyperglycemia; secondary to steroids.   Cardiology is primary.   Procedures:  none  Antibiotics:  none  HPI/Subjective: Breathing better. Still coughing.   Objective: Filed Vitals:   11/05/13 1430  BP: 173/90  Pulse: 95  Temp:   Resp: 19    Intake/Output Summary (Last 24 hours) at 11/05/13 1603 Last data filed at 11/05/13 0700  Gross per 24 hour  Intake  49.07 ml  Output    975 ml  Net -925.93 ml   Filed Weights   11/04/13 1719 11/04/13 2130 11/05/13 0500  Weight: 86.183 kg (190 lb) 84 kg (185 lb 3 oz) 83.7 kg (184 lb 8.4 oz)    Exam:   General:  No distress.   Cardiovascular: S 1, S 2 RRR  Respiratory: Bilateral crackles, and wheezes.   Abdomen: bs present, soft, nt  Musculoskeletal: plus 2 edema.   Data Reviewed: Basic Metabolic Panel:  Recent Labs Lab 11/04/13 1824 11/05/13 0027 11/05/13 0458 11/05/13 1230  NA 140 141 141 140  K 4.4 3.9  4.5 4.3  CL 99 96 98 96  CO2 24 27 23 23   GLUCOSE 95 95 121* 173*  BUN 21 21 24* 29*  CREATININE 1.30* 1.35* 1.29* 1.40*  CALCIUM 9.2 8.7 8.8 8.8  MG  --  2.2  --   --    Liver Function Tests:  Recent Labs Lab 11/04/13 1824 11/05/13 0027  AST 24 22  ALT 12 12  ALKPHOS 86 84  BILITOT 0.5 0.6  PROT 8.1 8.2  ALBUMIN 4.0 3.7   No results found for this basename: LIPASE, AMYLASE,  in the last 168 hours No results found for this basename: AMMONIA,  in the last 168 hours CBC:  Recent Labs Lab 11/04/13 1824 11/05/13 0027  WBC 7.7 8.1  NEUTROABS 4.7 4.6  HGB 14.5 15.0  HCT 44.4 44.3  MCV 95.1 95.1  PLT 156 161   Cardiac Enzymes:  Recent Labs Lab 11/04/13 1824 11/05/13 0045 11/05/13 0458 11/05/13 1015  TROPONINI <0.30 <0.30 <0.30 <0.30   BNP (last 3 results)  Recent Labs  11/04/13 1824 11/05/13 0045  PROBNP 3503.0* 3807.0*   CBG: No results found for this basename: GLUCAP,  in the last 168 hours  Recent Results (from the past 240 hour(s))  MRSA PCR SCREENING     Status: None   Collection Time    11/04/13  9:29 PM      Result Value Range Status   MRSA by PCR NEGATIVE  NEGATIVE Final   Comment:            The GeneXpert MRSA Assay (  FDA     approved for NASAL specimens     only), is one component of a     comprehensive MRSA colonization     surveillance program. It is not     intended to diagnose MRSA     infection nor to guide or     monitor treatment for     MRSA infections.     Studies: Dg Chest 2 View  11/04/2013   CLINICAL DATA:  Shortness of breath  EXAM: CHEST  2 VIEW  COMPARISON:  DG CHEST 2 VIEW dated 08/08/2012  FINDINGS: Low lung volumes. Patient is status post median sternotomy. A left chest wall AICD single chamber is appreciated. Lead tips project in the region of the right ventricle. A prosthetic aortic valve is identified. There is prominence of the interstitial markings, and a component of peribronchial cuffing. Increased bilateral  perihilar densities appreciated. No focal regions of consolidation appreciated.  IMPRESSION: Bilateral interstitial infiltrates is may reflect sequela pulmonary edema. An infectious or inflammatory etiology is also a diagnostic consideration clinically appropriate. No focal regions consolidation appreciated.   Electronically Signed   By: Salome HolmesHector  Cooper M.D.   On: 11/04/2013 17:52    Scheduled Meds: . allopurinol  100 mg Oral BID  . atorvastatin  10 mg Oral q1800  . bisoprolol  5 mg Oral BID  . furosemide  80 mg Intravenous BID  . guaiFENesin  600 mg Oral BID  . ipratropium-albuterol  3 mL Nebulization Q4H  . methylPREDNISolone (SOLU-MEDROL) injection  40 mg Intravenous Q8H  . oseltamivir  30 mg Oral BID  . potassium chloride SA  20 mEq Oral BID  . sodium chloride  3 mL Intravenous Q12H  . warfarin  5 mg Oral ONCE-1800  . Warfarin - Pharmacist Dosing Inpatient   Does not apply q1800   Continuous Infusions: . nitroGLYCERIN 50 mcg/min (11/05/13 1435)    Active Problems:   HYPERLIPIDEMIA   HYPERTENSION   Heart valve replaced by other means   Acute CHF   Acute on chronic systolic CHF (congestive heart failure)   CKD (chronic kidney disease), stage III   A-fib   Acute respiratory failure    Time spent: 35 minutes.     [name]  [facility] Pager [phone]. If 7PM-7AM, please contact night-coverage at [email or web address], password [email or web address] 11/05/2013, 4:03 PM  LOS: 1 day

## 2013-11-06 ENCOUNTER — Inpatient Hospital Stay (HOSPITAL_COMMUNITY): Payer: Medicare Other

## 2013-11-06 DIAGNOSIS — I10 Essential (primary) hypertension: Secondary | ICD-10-CM

## 2013-11-06 DIAGNOSIS — K5900 Constipation, unspecified: Secondary | ICD-10-CM

## 2013-11-06 LAB — GLUCOSE, CAPILLARY
GLUCOSE-CAPILLARY: 158 mg/dL — AB (ref 70–99)
GLUCOSE-CAPILLARY: 168 mg/dL — AB (ref 70–99)
Glucose-Capillary: 176 mg/dL — ABNORMAL HIGH (ref 70–99)
Glucose-Capillary: 175 mg/dL — ABNORMAL HIGH (ref 70–99)

## 2013-11-06 LAB — CBC
HEMATOCRIT: 42.6 % (ref 36.0–46.0)
Hemoglobin: 14.5 g/dL (ref 12.0–15.0)
MCH: 31.8 pg (ref 26.0–34.0)
MCHC: 34 g/dL (ref 30.0–36.0)
MCV: 93.4 fL (ref 78.0–100.0)
Platelets: 182 10*3/uL (ref 150–400)
RBC: 4.56 MIL/uL (ref 3.87–5.11)
RDW: 13.8 % (ref 11.5–15.5)
WBC: 9.5 10*3/uL (ref 4.0–10.5)

## 2013-11-06 LAB — PROTIME-INR
INR: 1.86 — ABNORMAL HIGH (ref 0.00–1.49)
Prothrombin Time: 20.9 seconds — ABNORMAL HIGH (ref 11.6–15.2)

## 2013-11-06 LAB — BASIC METABOLIC PANEL
BUN: 45 mg/dL — AB (ref 6–23)
CALCIUM: 9 mg/dL (ref 8.4–10.5)
CO2: 24 mEq/L (ref 19–32)
Chloride: 97 mEq/L (ref 96–112)
Creatinine, Ser: 1.73 mg/dL — ABNORMAL HIGH (ref 0.50–1.10)
GFR calc Af Amer: 33 mL/min — ABNORMAL LOW (ref >90)
GFR calc non Af Amer: 29 mL/min — ABNORMAL LOW (ref >90)
GLUCOSE: 169 mg/dL — AB (ref 70–99)
Potassium: 4.6 mEq/L (ref 3.7–5.3)
Sodium: 139 mEq/L (ref 137–147)

## 2013-11-06 MED ORDER — PREDNISONE 10 MG PO TABS
60.0000 mg | ORAL_TABLET | Freq: Every day | ORAL | Status: DC
  Administered 2013-11-06 – 2013-11-08 (×3): 60 mg via ORAL
  Filled 2013-11-06 (×5): qty 1

## 2013-11-06 MED ORDER — WARFARIN SODIUM 5 MG PO TABS
5.0000 mg | ORAL_TABLET | Freq: Once | ORAL | Status: AC
  Administered 2013-11-06: 5 mg via ORAL
  Filled 2013-11-06: qty 1

## 2013-11-06 MED ORDER — AMLODIPINE BESYLATE 5 MG PO TABS
5.0000 mg | ORAL_TABLET | Freq: Every day | ORAL | Status: DC
Start: 2013-11-06 — End: 2013-11-07
  Administered 2013-11-06: 5 mg via ORAL
  Filled 2013-11-06 (×2): qty 1

## 2013-11-06 MED ORDER — POLYETHYLENE GLYCOL 3350 17 G PO PACK
17.0000 g | PACK | Freq: Two times a day (BID) | ORAL | Status: DC
  Administered 2013-11-06 – 2013-11-10 (×9): 17 g via ORAL
  Filled 2013-11-06 (×13): qty 1

## 2013-11-06 NOTE — Progress Notes (Signed)
Patient transported via bed with monitor, oxygen, & nurse to xray @ 0548 & returned at 0610.  Patient tolerated well.

## 2013-11-06 NOTE — Progress Notes (Signed)
[facility] PROGRESS NOTE  [name] WHQ:[id number] DOB: 10/14/1940 DOA: 11/04/2013 PCP: Lemont Fillers., NP  Assessment/Plan: 1-Acute respiratory failure  Multifactorial due to acute on chronic systolic congestive heart failure and probably upper respiratory viral illness.  Patient's acute on chronic congestive heart failure is being managed by cardiology.  influenza negative, no fevers, will discontinue tamiflu.   Change solumedrol to prednisone. Will do short taper.  Continue breathing treatments with nebulizers.  Repeat chest x ray 2-8 negative for infiltrates, no pulmonary edema.   Acute on chronic systolic congestive heart failure  Management as per cardiology. Continue diuresis on Lasix. Patient's carvedilol held likely due to wheezing. Not on ACE inhibitor or ARB, likely due to chronic kidney disease stage III.   Uncontrolled hypertension  Management as per cardiology. on nitroglycerin drip.   Chronic kidney disease stage III Cr baseline 1.4 to 1.5.  Worsening renal function, suspect secondary to diuresis. Cardiologist to adjust lasix.  Cr increase to 1.7.   Hyperlipidemia  Stable.   Atrial fibrillation on chronic anticoagulation  Rate controlled. Continue Coumadin.  Hyperglycemia; secondary to steroids.   Cardiology is primary.   Procedures:  none  Antibiotics:  none  HPI/Subjective: Feels better, breathing better, cough improved. No BM for last 4 days.   Objective: Filed Vitals:   11/06/13 0610  BP: 168/112  Pulse: 87  Temp:   Resp: 19    Intake/Output Summary (Last 24 hours) at 11/06/13 0748 Last data filed at 11/06/13 0700  Gross per 24 hour  Intake 1054.15 ml  Output    850 ml  Net 204.15 ml   Filed Weights   11/04/13 2130 11/05/13 0500 11/06/13 0400  Weight: 84 kg (185 lb 3 oz) 83.7 kg (184 lb 8.4 oz) 85 kg (187 lb 6.3 oz)    Exam:   General:  No distress.   Cardiovascular: S 1, S 2 RRR  Respiratory: no wheezes, no  significant crackles.   Abdomen: bs present, soft, nt  Musculoskeletal: plus 2 edema.   Data Reviewed: Basic Metabolic Panel:  Recent Labs Lab 11/04/13 1824 11/05/13 0027 11/05/13 0458 11/05/13 1230 11/06/13 0326  NA 140 141 141 140 139  K 4.4 3.9 4.5 4.3 4.6  CL 99 96 98 96 97  CO2 24 27 23 23 24   GLUCOSE 95 95 121* 173* 169*  BUN 21 21 24* 29* 45*  CREATININE 1.30* 1.35* 1.29* 1.40* 1.73*  CALCIUM 9.2 8.7 8.8 8.8 9.0  MG  --  2.2  --   --   --    Liver Function Tests:  Recent Labs Lab 11/04/13 1824 11/05/13 0027  AST 24 22  ALT 12 12  ALKPHOS 86 84  BILITOT 0.5 0.6  PROT 8.1 8.2  ALBUMIN 4.0 3.7   No results found for this basename: LIPASE, AMYLASE,  in the last 168 hours No results found for this basename: AMMONIA,  in the last 168 hours CBC:  Recent Labs Lab 11/04/13 1824 11/05/13 0027 11/06/13 0326  WBC 7.7 8.1 9.5  NEUTROABS 4.7 4.6  --   HGB 14.5 15.0 14.5  HCT 44.4 44.3 42.6  MCV 95.1 95.1 93.4  PLT 156 161 182   Cardiac Enzymes:  Recent Labs Lab 11/04/13 1824 11/05/13 0045 11/05/13 0458 11/05/13 1015  TROPONINI <0.30 <0.30 <0.30 <0.30   BNP (last 3 results)  Recent Labs  11/04/13 1824 11/05/13 0045  PROBNP 3503.0* 3807.0*   CBG:  Recent Labs Lab 11/05/13 1704 11/05/13 2140  GLUCAP  205* 186*    Recent Results (from the past 240 hour(s))  MRSA PCR SCREENING     Status: None   Collection Time    11/04/13  9:29 PM      Result Value Range Status   MRSA by PCR NEGATIVE  NEGATIVE Final   Comment:            The GeneXpert MRSA Assay (FDA     approved for NASAL specimens     only), is one component of a     comprehensive MRSA colonization     surveillance program. It is not     intended to diagnose MRSA     infection nor to guide or     monitor treatment for     MRSA infections.     Studies: Dg Chest 2 View  11/04/2013   CLINICAL DATA:  Shortness of breath  EXAM: CHEST  2 VIEW  COMPARISON:  DG CHEST 2 VIEW dated  08/08/2012  FINDINGS: Low lung volumes. Patient is status post median sternotomy. A left chest wall AICD single chamber is appreciated. Lead tips project in the region of the right ventricle. A prosthetic aortic valve is identified. There is prominence of the interstitial markings, and a component of peribronchial cuffing. Increased bilateral perihilar densities appreciated. No focal regions of consolidation appreciated.  IMPRESSION: Bilateral interstitial infiltrates is may reflect sequela pulmonary edema. An infectious or inflammatory etiology is also a diagnostic consideration clinically appropriate. No focal regions consolidation appreciated.   Electronically Signed   By: Salome HolmesHector  Cooper M.D.   On: 11/04/2013 17:52    Scheduled Meds: . allopurinol  100 mg Oral BID  . atorvastatin  10 mg Oral q1800  . bisoprolol  5 mg Oral BID  . furosemide  80 mg Intravenous BID  . guaiFENesin  600 mg Oral BID  . insulin aspart  0-9 Units Subcutaneous TID WC  . ipratropium-albuterol  3 mL Nebulization Q4H  . methylPREDNISolone (SOLU-MEDROL) injection  40 mg Intravenous Q8H  . oseltamivir  30 mg Oral BID  . potassium chloride SA  20 mEq Oral BID  . sodium chloride  3 mL Intravenous Q12H  . warfarin  5 mg Oral ONCE-1800  . Warfarin - Pharmacist Dosing Inpatient   Does not apply q1800   Continuous Infusions: . nitroGLYCERIN 20 mcg/min (11/06/13 0610)    Active Problems:   HYPERLIPIDEMIA   HYPERTENSION   Heart valve replaced by other means   Acute CHF   Acute on chronic systolic CHF (congestive heart failure)   CKD (chronic kidney disease), stage III   A-fib   Acute respiratory failure    Time spent: 35 minutes.     Miklo Aken  [facility] Pager [phone]. If 7PM-7AM, please contact night-coverage at [email or web address], password [email or web address] 11/06/2013, 7:48 AM  LOS: 2 days

## 2013-11-06 NOTE — Progress Notes (Signed)
Patient ID: [name], female   DOB: 09-16-1941, 73 y.o.   MRN: [id number]   SUBJECTIVE: HR better controlled, 70s-90s.  Still wheezing but better.  Creatinine up with poor UOP yesterday.    Scheduled Meds: . allopurinol  100 mg Oral BID  . amLODipine  5 mg Oral Daily  . atorvastatin  10 mg Oral q1800  . bisoprolol  5 mg Oral BID  . guaiFENesin  600 mg Oral BID  . insulin aspart  0-9 Units Subcutaneous TID WC  . polyethylene glycol  17 g Oral BID  . predniSONE  60 mg Oral Q breakfast  . sodium chloride  3 mL Intravenous Q12H  . warfarin  5 mg Oral ONCE-1800  . Warfarin - Pharmacist Dosing Inpatient   Does not apply q1800   Continuous Infusions: . nitroGLYCERIN 20 mcg/min (11/06/13 0610)   PRN Meds:.sodium chloride, acetaminophen, albuterol, ondansetron (ZOFRAN) IV, sodium chloride    Filed Vitals:   11/06/13 0905 11/06/13 1000 11/06/13 1100 11/06/13 1137  BP:  124/89  123/72  Pulse:  98 85 81  Temp:    97.5 F (36.4 C)  TempSrc:    Oral  Resp:  17 20 21   Height:      Weight:      SpO2: 96% 93% 95% 98%    Intake/Output Summary (Last 24 hours) at 11/06/13 1155 Last data filed at 11/06/13 1100  Gross per 24 hour  Intake 712.15 ml  Output    740 ml  Net -27.85 ml    LABS: Basic Metabolic Panel:  Recent Labs  14/10/30 0027  11/05/13 1230 11/06/13 0326  NA 141  < > 140 139  K 3.9  < > 4.3 4.6  CL 96  < > 96 97  CO2 27  < > 23 24  GLUCOSE 95  < > 173* 169*  BUN 21  < > 29* 45*  CREATININE 1.35*  < > 1.40* 1.73*  CALCIUM 8.7  < > 8.8 9.0  MG 2.2  --   --   --   < > = values in this interval not displayed. Liver Function Tests:  Recent Labs  11/04/13 1824 11/05/13 0027  AST 24 22  ALT 12 12  ALKPHOS 86 84  BILITOT 0.5 0.6  PROT 8.1 8.2  ALBUMIN 4.0 3.7   No results found for this basename: LIPASE, AMYLASE,  in the last 72 hours CBC:  Recent Labs  11/04/13 1824 11/05/13 0027 11/06/13 0326  WBC 7.7 8.1 9.5  NEUTROABS 4.7 4.6  --   HGB  14.5 15.0 14.5  HCT 44.4 44.3 42.6  MCV 95.1 95.1 93.4  PLT 156 161 182   Cardiac Enzymes:  Recent Labs  11/05/13 0045 11/05/13 0458 11/05/13 1015  TROPONINI <0.30 <0.30 <0.30   BNP: No components found with this basename: POCBNP,  D-Dimer: No results found for this basename: DDIMER,  in the last 72 hours Hemoglobin A1C: No results found for this basename: HGBA1C,  in the last 72 hours Fasting Lipid Panel: No results found for this basename: CHOL, HDL, LDLCALC, TRIG, CHOLHDL, LDLDIRECT,  in the last 72 hours Thyroid Function Tests:  Recent Labs  11/05/13 0027  TSH 2.119   Anemia Panel: No results found for this basename: VITAMINB12, FOLATE, FERRITIN, TIBC, IRON, RETICCTPCT,  in the last 72 hours  RADIOLOGY: Dg Chest 2 View  11/04/2013   CLINICAL DATA:  Shortness of breath  EXAM: CHEST  2 VIEW  COMPARISON:  DG CHEST 2 VIEW dated 08/08/2012  FINDINGS: Low lung volumes. Patient is status post median sternotomy. A left chest wall AICD single chamber is appreciated. Lead tips project in the region of the right ventricle. A prosthetic aortic valve is identified. There is prominence of the interstitial markings, and a component of peribronchial cuffing. Increased bilateral perihilar densities appreciated. No focal regions of consolidation appreciated.  IMPRESSION: Bilateral interstitial infiltrates is may reflect sequela pulmonary edema. An infectious or inflammatory etiology is also a diagnostic consideration clinically appropriate. No focal regions consolidation appreciated.   Electronically Signed   By: Salome HolmesHector  Cooper M.D.   On: 11/04/2013 17:52    PHYSICAL EXAM General: NAD Neck: JVP 8-9 cm, no thyromegaly or thyroid nodule.  Lungs: End expiratory wheezes and crackles at bases bilaterally. CV: Nondisplaced PMI.  Heart tachy, irregular S1/S2, no S3/S4, 1/6 SEM RUSB.  No peripheral edema.   Abdomen: Soft, nontender, no hepatosplenomegaly, no distention.  Neurologic: Alert and  oriented x 3.  Psych: Normal affect. Extremities: No clubbing or cyanosis.   TELEMETRY: Reviewed telemetry pt in atrial fibrillation, rate 70s-90s  ASSESSMENT AND PLAN: 73 yo with history of AI s/p bioprosthetic AVR, CKD, cardiomyopathy, and chronic atrial fibrillation presented with acute on chronic systolic CHF and atrial fibrillation with RVR in the setting of possible viral illness.  1. ID: Suspect viral syndrome.  She is afebrile.  Flu negative. Still some end expiratory wheezing on lung exam but think better.  On prednisone per [facility]. 2. CHF: Acute on chronic primarily diastolic CHF.  Echo with EF 45-50% and mild RV systolic dysfunction.  She did not diurese well and creatinine is up.  She is not markedly volume overloaded.  Suspect symptomatology is primarily due to viral syndrome with bronchospasm.  - Stop IV Lasix.   3. Atrial fibrillation:  Avoid diltiazem with low EF.  Will use beta-1 selective bisoprolol at 5 mg bid to try to control HR.  Continue warfarin.  4. AKI on CKD: Holding Lasix as above.  5. HTN: Will add amlodipine 5 mg daily and try to get her off NTG gtt.   Marca AnconaDalton Keyler Hoge 11/06/2013 11:55 AM

## 2013-11-06 NOTE — Progress Notes (Signed)
ANTICOAGULATION CONSULT NOTE - Initial Consult  Pharmacy Consult for Warfarin  Indication: AVR, afib  Allergies  Allergen Reactions  . Adhesive [Tape] Itching, Rash and Other (See Comments)    Redness and skin breaks down  . Latex Rash  . Neomycin-Bacitracin Zn-Polymyx Rash   Patient Measurements: Height: 5\' 1"  (154.9 cm) Weight: 187 lb 6.3 oz (85 kg) IBW/kg (Calculated) : 47.8  Vital Signs: Temp: 98.4 F (36.9 C) (02/08 0400) Temp src: Oral (02/08 0400) BP: 168/112 mmHg (02/08 0610) Pulse Rate: 87 (02/08 0610)  Labs:  Recent Labs  11/04/13 1824 11/05/13 0027 11/05/13 0045 11/05/13 0458 11/05/13 1015 11/05/13 1230 11/06/13 0326  HGB 14.5 15.0  --   --   --   --  14.5  HCT 44.4 44.3  --   --   --   --  42.6  PLT 156 161  --   --   --   --  182  APTT  --  33  --   --   --   --   --   LABPROT 19.1* 18.0*  --  18.1*  --   --  20.9*  INR 1.66* 1.53*  --  1.54*  --   --  1.86*  CREATININE 1.30* 1.35*  --  1.29*  --  1.40* 1.73*  TROPONINI <0.30  --  <0.30 <0.30 <0.30  --   --    Estimated Creatinine Clearance: 29.5 ml/min (by C-G formula based on Cr of 1.73).  Medical History: Past Medical History  Diagnosis Date  . History of ventricular tachycardia   . Peripheral edema   . Aortic insufficiency     Moderate 2012  . History of gout   . Exogenous obesity   . History of CHF (congestive heart failure)     EF 45% 2012, nonischemic (Normal coronaries 2002)  . 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 Hypertension   . 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 ICD (implantable cardiac defibrillator) in place 2012  . DOE (dyspnea on exertion)   . Stroke 2000  . Renal insufficiency   . Anxiety   . 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 Anemia   . Presence of IVC filter 2000  . Osteopenia 09/01/2013  . Coronary artery disease   . Myocardial infarction 2000  .  Pacemaker   . CHF (congestive heart failure)    Medications:  Warfarin 5 mg on Tues/Thu, 2.5 mg all other days  Assessment: 73 y/o F presents 02/06 with pulmonary edema vs PNA, on chronic warfarin for afib/AVR, INR 1.66 on admit. Denies chest pain, abd pain, diarrhea or visual changes.  INR 1.86 this AM. CBC stable, plts wnl. No bleeding noted.   Goal of Therapy:  INR 2-3 Monitor platelets by anticoagulation protocol: Yes   Plan:  -Warfarin 5mg  po x 1 tonight  -Daily PT/INR -Monitor for bleeding  Britt Bottom B. Artelia Laroche, PharmD Clinical Pharmacist - Resident Phone: [phone] Pager: [phone] 11/06/2013 7:49 AM

## 2013-11-07 DIAGNOSIS — I5043 Acute on chronic combined systolic (congestive) and diastolic (congestive) heart failure: Principal | ICD-10-CM

## 2013-11-07 LAB — BASIC METABOLIC PANEL
BUN: 52 mg/dL — AB (ref 6–23)
CO2: 25 meq/L (ref 19–32)
Calcium: 9 mg/dL (ref 8.4–10.5)
Chloride: 100 mEq/L (ref 96–112)
Creatinine, Ser: 1.54 mg/dL — ABNORMAL HIGH (ref 0.50–1.10)
GFR calc Af Amer: 38 mL/min — ABNORMAL LOW (ref >90)
GFR, EST NON AFRICAN AMERICAN: 33 mL/min — AB (ref >90)
Glucose, Bld: 124 mg/dL — ABNORMAL HIGH (ref 70–99)
Potassium: 4.6 mEq/L (ref 3.7–5.3)
Sodium: 140 mEq/L (ref 137–147)

## 2013-11-07 LAB — CBC
HCT: 43.4 % (ref 36.0–46.0)
Hemoglobin: 14.9 g/dL (ref 12.0–15.0)
MCH: 31.9 pg (ref 26.0–34.0)
MCHC: 34.3 g/dL (ref 30.0–36.0)
MCV: 92.9 fL (ref 78.0–100.0)
Platelets: 227 10*3/uL (ref 150–400)
RBC: 4.67 MIL/uL (ref 3.87–5.11)
RDW: 14 % (ref 11.5–15.5)
WBC: 13.2 10*3/uL — ABNORMAL HIGH (ref 4.0–10.5)

## 2013-11-07 LAB — GLUCOSE, CAPILLARY
GLUCOSE-CAPILLARY: 116 mg/dL — AB (ref 70–99)
GLUCOSE-CAPILLARY: 142 mg/dL — AB (ref 70–99)
Glucose-Capillary: 106 mg/dL — ABNORMAL HIGH (ref 70–99)
Glucose-Capillary: 142 mg/dL — ABNORMAL HIGH (ref 70–99)

## 2013-11-07 LAB — PROTIME-INR
INR: 2.64 — ABNORMAL HIGH (ref 0.00–1.49)
Prothrombin Time: 27.3 seconds — ABNORMAL HIGH (ref 11.6–15.2)

## 2013-11-07 MED ORDER — AMLODIPINE BESYLATE 10 MG PO TABS
10.0000 mg | ORAL_TABLET | Freq: Every day | ORAL | Status: DC
Start: 2013-11-07 — End: 2013-11-11
  Administered 2013-11-07 – 2013-11-11 (×5): 10 mg via ORAL
  Filled 2013-11-07 (×5): qty 1

## 2013-11-07 MED ORDER — ISOSORBIDE MONONITRATE ER 30 MG PO TB24
30.0000 mg | ORAL_TABLET | Freq: Every day | ORAL | Status: DC
  Administered 2013-11-07 – 2013-11-09 (×3): 30 mg via ORAL
  Filled 2013-11-07 (×3): qty 1

## 2013-11-07 MED ORDER — FLEET ENEMA 7-19 GM/118ML RE ENEM
1.0000 | ENEMA | Freq: Once | RECTAL | Status: DC
  Filled 2013-11-07: qty 1

## 2013-11-07 MED ORDER — WARFARIN SODIUM 2.5 MG PO TABS
2.5000 mg | ORAL_TABLET | Freq: Once | ORAL | Status: AC
  Administered 2013-11-07: 2.5 mg via ORAL
  Filled 2013-11-07: qty 1

## 2013-11-07 MED ORDER — HYDRALAZINE HCL 20 MG/ML IJ SOLN
10.0000 mg | INTRAMUSCULAR | Status: DC | PRN
  Administered 2013-11-07 – 2013-11-08 (×2): 10 mg via INTRAVENOUS
  Filled 2013-11-07 (×2): qty 1

## 2013-11-07 NOTE — Progress Notes (Signed)
ANTICOAGULATION CONSULT NOTE - Pharmacy Consult for Warfarin  Indication: AVR, afib  Allergies  Allergen Reactions  . Adhesive [Tape] Itching, Rash and Other (See Comments)    Redness and skin breaks down  . Latex Rash  . Neomycin-Bacitracin Zn-Polymyx Rash   Patient Measurements: Height: 5\' 1"  (154.9 cm) Weight: 187 lb 2.7 oz (84.9 kg) IBW/kg (Calculated) : 47.8  Vital Signs: Temp: 97.2 F (36.2 C) (02/09 1100) Temp src: Oral (02/09 1100) BP: 139/95 mmHg (02/09 1201) Pulse Rate: 77 (02/09 1201)  Labs:  Recent Labs  11/05/13 0027 11/05/13 0045 11/05/13 0458 11/05/13 1015 11/05/13 1230 11/06/13 0326 11/07/13 0458  HGB 15.0  --   --   --   --  14.5 14.9  HCT 44.3  --   --   --   --  42.6 43.4  PLT 161  --   --   --   --  182 227  APTT 33  --   --   --   --   --   --   LABPROT 18.0*  --  18.1*  --   --  20.9* 27.3*  INR 1.53*  --  1.54*  --   --  1.86* 2.64*  CREATININE 1.35*  --  1.29*  --  1.40* 1.73* 1.54*  TROPONINI  --  <0.30 <0.30 <0.30  --   --   --    Estimated Creatinine Clearance: 33.1 ml/min (by C-G formula based on Cr of 1.54).  Medical History: Past Medical History  Diagnosis Date  . History of ventricular tachycardia   . Peripheral edema   . Aortic insufficiency     Moderate 2012  . History of gout   . Exogenous obesity   . History of CHF (congestive heart failure)     EF 45% 2012, nonischemic (Normal coronaries 2002)  . 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 Hypertension   . 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 ICD (implantable cardiac defibrillator) in place 2012  . DOE (dyspnea on exertion)   . Stroke 2000  . Renal insufficiency   . Anxiety   . 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 Anemia   . Presence of IVC filter 2000  . Osteopenia 09/01/2013  . Coronary artery disease   . Myocardial infarction 2000  . Pacemaker   . CHF  (congestive heart failure)    Medications:  Warfarin 5 mg on Tues/Thu, 2.5 mg all other days, INR 1.66 on admit  Assessment: 73 y/o F presents 02/06 with pulmonary edema vs PNA. Pharmacy consulted to continue warfarin  for afib/AVR, .  Coag: INR 2.64 this AM. CBC stable, plts wnl. No bleeding noted.   Goal of Therapy:  INR 2-3 Monitor platelets by anticoagulation protocol: Yes   Plan:  -Warfarin 2.5mg  po x 1 tonight  -Daily PT/INR -Monitor for bleeding   Thank you for allowing pharmacy to be a part of this patients care team.  Lovenia Kim Pharm.D., BCPS Clinical Pharmacist 11/07/2013 12:32 PM Pager: [phone] Phone: [phone]

## 2013-11-07 NOTE — Evaluation (Signed)
Physical Therapy Evaluation Patient Details Name: [name] MRN: [id number] DOB: Jul 11, 1941 Today's Date: 11/07/2013 Time: 1130-1200 PT Time Calculation (min): 30 min  PT Assessment / Plan / Recommendation History of Present Illness  Pt admit with CHF.  Clinical Impression  Pt admitted with above. Pt currently with functional limitations due to balance and endurance deficits. Daughter works but feel pt will function well as long as she uses RW for mobility when she goes home and receives HHPT f/u.    Pt will benefit from skilled PT to increase their independence and safety with mobility to allow discharge to the venue listed below.     PT Assessment  Patient needs continued PT services    Follow Up Recommendations  Home health PT;Supervision - Intermittent        Barriers to Discharge Decreased caregiver support daughter works    Equipment Recommendations  None recommended by PT       Frequency Min 3X/week    Precautions / Restrictions Precautions Precautions: Fall Restrictions Weight Bearing Restrictions: No   Pertinent Vitals/Pain 94% O2 prior to getting up.  86% O2 after walk for about 2 min on RA and then 94 % and >; BP 154/117 pre ambulation BP and 139/95 after ambulation BP.  No pain      Mobility  Bed Mobility Overal bed mobility: Needs Assistance Bed Mobility: Supine to Sit Supine to sit: Min guard Transfers Overall transfer level: Needs assistance Equipment used: Rolling walker (2 wheeled) Transfers: Sit to/from Stand Sit to Stand: Min assist General transfer comment: Cues for hand placement.  Steadying assist upon initial stand.   Ambulation/Gait Ambulation/Gait assistance: Min assist Ambulation Distance (Feet): 225 Feet Assistive device: 1 person hand held assist Gait Pattern/deviations: Step-through pattern;Decreased stride length;Wide base of support;Decreased step length - right;Decreased step length - left Gait velocity interpretation: <1.8  ft/sec, indicative of risk for recurrent falls General Gait Details: Pt needs UE support for balance as she is slightly unsteady on her feet.  Recommend pt use RW upon initial d/c home for safety.  Pt agrees.    Exercises General Exercises - Lower Extremity Ankle Circles/Pumps: AROM;5 reps;Both;Seated Long Arc Quad: AROM;Both;5 reps;Seated Hip ABduction/ADduction: AROM;Both;5 reps;Seated Hip Flexion/Marching: AROM;Both;10 reps;Seated   PT Diagnosis: Generalized weakness  PT Problem List: Decreased balance;Decreased activity tolerance;Decreased mobility;Decreased knowledge of use of DME;Decreased safety awareness;Decreased knowledge of precautions PT Treatment Interventions: DME instruction;Gait training;Functional mobility training;Therapeutic activities;Therapeutic exercise;Balance training;Patient/family education     PT Goals(Current goals can be found in the care plan section) Acute Rehab PT Goals Patient Stated Goal: to go home PT Goal Formulation: With patient Time For Goal Achievement: 11/14/13 Potential to Achieve Goals: Good  Visit Information  Last PT Received On: 11/07/13 Assistance Needed: +1 History of Present Illness: Pt admit with CHF.       Prior Functioning  Home Living Family/patient expects to be discharged to:: Private residence Living Arrangements: Children (lives wtih daughter who works) Available Help at Discharge: Family;Available PRN/intermittently Type of Home: House Home Access: Level entry Home Layout: One level Home Equipment: Walker - 4 wheels;Cane - single point;Bedside commode;Shower seat;Hand held shower head;Grab bars - tub/shower;Grab bars - toilet;Wheelchair - Engineer, technical sales - power Additional Comments: Craftmatic bed Prior Function Level of Independence: Independent Comments: Drives Communication Communication: No difficulties Dominant Hand: Left    Cognition  Cognition Arousal/Alertness: Awake/alert Behavior During Therapy: WFL  for tasks assessed/performed Overall Cognitive Status: Within Functional Limits for tasks assessed    Extremity/Trunk Assessment Upper Extremity  Assessment Upper Extremity Assessment: Defer to OT evaluation Lower Extremity Assessment Lower Extremity Assessment: Generalized weakness Cervical / Trunk Assessment Cervical / Trunk Assessment: Normal   Balance Balance Overall balance assessment: Needs assistance;History of Falls Sitting-balance support: Bilateral upper extremity supported;Feet supported Sitting balance-Leahy Scale: Good Postural control: Posterior lean Standing balance support: Bilateral upper extremity supported;During functional activity Standing balance-Leahy Scale: Fair Standing balance comment: slight posterior lean in standing.  Needs steadying support at times.  Pt aware.    End of Session PT - End of Session Equipment Utilized During Treatment: Gait belt;Oxygen Activity Tolerance: Patient limited by fatigue Patient left: in chair;with call bell/phone within reach Nurse Communication: Mobility status       INGOLD,Laquon Emel 11/07/2013, 1:01 PM [facility] CenterDawn Ingold,PT Acute Rehabilitation [phone] (pager)

## 2013-11-07 NOTE — Progress Notes (Signed)
    Subjective:  Doing ok. Complains of constipation. States had 'rough time' last night when BP went up. Still with mild dyspnea this morning. No chest pain.  Objective:  Vital Signs in the last 24 hours: Temp:  [97.4 F (36.3 C)-98.2 F (36.8 C)] 97.4 F (36.3 C) (02/09 0400) Pulse Rate:  [60-98] 77 (02/08 2200) Resp:  [14-27] 18 (02/09 0400) BP: (117-185)/(72-145) 130/88 mmHg (02/09 0400) SpO2:  [93 %-99 %] 94 % (02/09 0400) Weight:  [187 lb 2.7 oz (84.9 kg)] 187 lb 2.7 oz (84.9 kg) (02/09 0400)  Intake/Output from previous day: 02/08 0701 - 02/09 0700 In: 578.2 [P.O.:440; I.V.:138.2] Out: 1225 [Urine:1225]  Physical Exam: Pt is alert and oriented, NAD HEENT: normal Neck: JVP - normal Lungs: CTA bilaterally CV: RRR with scattered rhonchi Abd: soft, mild diffuse tenderness without rebound or guarding, +BS Ext: no C/C/E, distal pulses intact and equal Skin: warm/dry no rash   Lab Results:  Recent Labs  11/06/13 0326 11/07/13 0458  WBC 9.5 13.2*  HGB 14.5 14.9  PLT 182 227    Recent Labs  11/06/13 0326 11/07/13 0458  NA 139 140  K 4.6 4.6  CL 97 100  CO2 24 25  GLUCOSE 169* 124*  BUN 45* 52*  CREATININE 1.73* 1.54*    Recent Labs  11/05/13 0458 11/05/13 1015  TROPONINI <0.30 <0.30    Cardiac Studies: 2D Echo: Study Conclusions  - Left ventricle: The cavity size was normal. There was moderate concentric hypertrophy. Systolic function was mildly reduced. The estimated ejection fraction was in the range of 45% to 50%. Wall motion was normal; there were no regional wall motion abnormalities. The study is not technically sufficient to allow evaluation of LV diastolic function. - Aortic valve: A bioprosthetic valve sits well in the aortic position. The leaflets open well. There is no central aortic insufficiency or paravalvular leak. The mean transaortic gradient is55mmHg and the peak gradient is 21 mmHg and are normal for this type of  prosthesis. - Aorta: Normal size ascending aorta measiring 34 mm. Aortic root is mildly dilated and measures 43 mm. - Mitral valve: No regurgitation. - Left atrium: The atrium was mildly dilated. - Right ventricle: The cavity size was mildly dilated. Wall thickness was normal. Systolic function was mildly reduced. - Tricuspid valve: Trivial regurgitation. - Pulmonic valve: No regurgitation. - Pericardium, extracardiac: A trivial pericardial effusion was identified. There was a left pleural effusion.  CXR 2/8: IMPRESSION:  No significant pulmonary edema and no focal chest disease.   Tele: Atrial fibrillation heart rate 70's  Assessment/Plan:  1. 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 Agree that she does not appear volume overloaded. Will add back oral furosemide. Continue beta-blocker. Avoid ACE in setting acute kidney injury.  2. Type 2 DM - CBG's acceptable on current Rx  3. Atrial fibrillation - maintained on warfarin  4. Aortic valve disease s/p AVR (bioprosthetic) - normal function on echo  5. Acute kidney injury on background of CKD: stabilized, pt off diuretics (creatinine 1.5 today)  6. HTN, malignant: BP's reviewed and control better overnight. This is the primary cardiac issue at present. Plan: increase amlodipine to 10 mg, give Imdur, continue bisoprolol.  Tonny Bollman, M.D. 11/07/2013, 7:27 AM

## 2013-11-07 NOTE — Progress Notes (Signed)
[facility] PROGRESS NOTE  DELANEE TORNERO GMW:[id number] DOB: 1941-05-25 DOA: 11/04/2013 PCP: Lemont Fillers., NP  Assessment/Plan: 1-Acute respiratory failure  Multifactorial due to acute on chronic systolic congestive heart failure and probably upper respiratory viral illness.  Patient's acute on chronic congestive heart failure is being managed by cardiology.  influenza negative, no fevers, tamiflu discontinue.   Repeat chest x ray 2-8 negative for infiltrates, no pulmonary edema.  Improved.  Change solumedrol to prednisone 2-8. Plan to decrease prednisone 2-10.  Continue breathing treatments with nebulizers.   Acute on chronic systolic congestive heart failure  Management as per cardiology. Continue diuresis on Lasix. Patient's carvedilol held likely due to wheezing. Not on ACE inhibitor or ARB, likely due to chronic kidney disease stage III.  Consider resume lasix.   Uncontrolled hypertension  Management as per cardiology.  Off nitroglycerin Gtt since 4 am.  Continue with Norvasc, bisoprolol.   Chronic kidney disease stage III Cr baseline 1.4 to 1.5.  Worsening renal function, suspect secondary to diuresis. Cardiologist to adjust lasix.  Cr decrease to 1.5.  Hyperlipidemia  Stable.   Atrial fibrillation on chronic anticoagulation  Rate controlled. Continue Coumadin.  Hyperglycemia; secondary to steroids.  Constipation; will order fleet enema.   Disposition;will order PT, OT consult.   Procedures:  none  Antibiotics:  none  HPI/Subjective: Feels better, breathing better, cough improved. Still no Bm. Breathing better.   Objective: Filed Vitals:   11/07/13 0400  BP: 130/88  Pulse:   Temp: 97.4 F (36.3 C)  Resp: 18    Intake/Output Summary (Last 24 hours) at 11/07/13 0741 Last data filed at 11/07/13 0600  Gross per 24 hour  Intake  578.2 ml  Output   1225 ml  Net -646.8 ml   Filed Weights   11/05/13 0500 11/06/13 0400 11/07/13 0400   Weight: 83.7 kg (184 lb 8.4 oz) 85 kg (187 lb 6.3 oz) 84.9 kg (187 lb 2.7 oz)    Exam:   General:  No distress.   Cardiovascular: S 1, S 2 RRR  Respiratory: no wheezes, bilateral crackles.   Abdomen: bs present, soft, nt Musculoskeletal: Trace edema.  Data Reviewed: Basic Metabolic Panel:  Recent Labs Lab 11/05/13 0027 11/05/13 0458 11/05/13 1230 11/06/13 0326 11/07/13 0458  NA 141 141 140 139 140  K 3.9 4.5 4.3 4.6 4.6  CL 96 98 96 97 100  CO2 27 23 23 24 25   GLUCOSE 95 121* 173* 169* 124*  BUN 21 24* 29* 45* 52*  CREATININE 1.35* 1.29* 1.40* 1.73* 1.54*  CALCIUM 8.7 8.8 8.8 9.0 9.0  MG 2.2  --   --   --   --    Liver Function Tests:  Recent Labs Lab 11/04/13 1824 11/05/13 0027  AST 24 22  ALT 12 12  ALKPHOS 86 84  BILITOT 0.5 0.6  PROT 8.1 8.2  ALBUMIN 4.0 3.7   No results found for this basename: LIPASE, AMYLASE,  in the last 168 hours No results found for this basename: AMMONIA,  in the last 168 hours CBC:  Recent Labs Lab 11/04/13 1824 11/05/13 0027 11/06/13 0326 11/07/13 0458  WBC 7.7 8.1 9.5 13.2*  NEUTROABS 4.7 4.6  --   --   HGB 14.5 15.0 14.5 14.9  HCT 44.4 44.3 42.6 43.4  MCV 95.1 95.1 93.4 92.9  PLT 156 161 182 227   Cardiac Enzymes:  Recent Labs Lab 11/04/13 1824 11/05/13 0045 11/05/13 0458 11/05/13 1015  TROPONINI <0.30 <0.30 <0.30 <0.30  BNP (last 3 results)  Recent Labs  11/04/13 1824 11/05/13 0045  PROBNP 3503.0* 3807.0*   CBG:  Recent Labs Lab 11/05/13 2140 11/06/13 0844 11/06/13 1140 11/06/13 1704 11/06/13 2130  GLUCAP 186* 158* 176* 175* 168*    Recent Results (from the past 240 hour(s))  MRSA PCR SCREENING     Status: None   Collection Time    11/04/13  9:29 PM      Result Value Range Status   MRSA by PCR NEGATIVE  NEGATIVE Final   Comment:            The GeneXpert MRSA Assay (FDA     approved for NASAL specimens     only), is one component of a     comprehensive MRSA colonization      surveillance program. It is not     intended to diagnose MRSA     infection nor to guide or     monitor treatment for     MRSA infections.     Studies: Dg Chest 2 View  11/06/2013   CLINICAL DATA:  Follow up edema.  EXAM: CHEST  2 VIEW  COMPARISON:  11/04/2013  FINDINGS: Heart size is mildly enlarged but unchanged. Stable position of the left cardiac ICD. Stable postsurgical changes in the chest. There is no significant pulmonary edema or focal airspace disease. Negative for a pneumothorax. No significant pleural effusion.  IMPRESSION: No significant pulmonary edema and no focal chest disease.   Electronically Signed   By: Richarda OverlieAdam  Henn M.D.   On: 11/06/2013 07:47    Scheduled Meds: . allopurinol  100 mg Oral BID  . amLODipine  5 mg Oral Daily  . atorvastatin  10 mg Oral q1800  . bisoprolol  5 mg Oral BID  . guaiFENesin  600 mg Oral BID  . insulin aspart  0-9 Units Subcutaneous TID WC  . polyethylene glycol  17 g Oral BID  . predniSONE  60 mg Oral Q breakfast  . sodium chloride  3 mL Intravenous Q12H  . Warfarin - Pharmacist Dosing Inpatient   Does not apply q1800   Continuous Infusions:    Active Problems:   HYPERLIPIDEMIA   HYPERTENSION   Heart valve replaced by other means   Acute CHF   Acute on chronic systolic CHF (congestive heart failure)   CKD (chronic kidney disease), stage III   A-fib   Acute respiratory failure    Time spent: 25 minutes.     Yesha Muchow  [facility] Pager [phone]. If 7PM-7AM, please contact night-coverage at [email or web address], password [email or web address] 11/07/2013, 7:41 AM  LOS: 3 days

## 2013-11-08 DIAGNOSIS — I160 Hypertensive urgency: Secondary | ICD-10-CM

## 2013-11-08 DIAGNOSIS — I10 Essential (primary) hypertension: Secondary | ICD-10-CM

## 2013-11-08 DIAGNOSIS — J209 Acute bronchitis, unspecified: Secondary | ICD-10-CM

## 2013-11-08 LAB — BASIC METABOLIC PANEL
BUN: 49 mg/dL — AB (ref 6–23)
CALCIUM: 8.8 mg/dL (ref 8.4–10.5)
CO2: 23 mEq/L (ref 19–32)
CREATININE: 1.22 mg/dL — AB (ref 0.50–1.10)
Chloride: 103 mEq/L (ref 96–112)
GFR, EST AFRICAN AMERICAN: 50 mL/min — AB (ref >90)
GFR, EST NON AFRICAN AMERICAN: 43 mL/min — AB (ref >90)
GLUCOSE: 177 mg/dL — AB (ref 70–99)
POTASSIUM: 4 meq/L (ref 3.7–5.3)
Sodium: 143 mEq/L (ref 137–147)

## 2013-11-08 LAB — GLUCOSE, CAPILLARY
Glucose-Capillary: 100 mg/dL — ABNORMAL HIGH (ref 70–99)
Glucose-Capillary: 149 mg/dL — ABNORMAL HIGH (ref 70–99)
Glucose-Capillary: 231 mg/dL — ABNORMAL HIGH (ref 70–99)
Glucose-Capillary: 170 mg/dL — ABNORMAL HIGH (ref 70–99)

## 2013-11-08 LAB — PROTIME-INR
INR: 3.07 — AB (ref 0.00–1.49)
Prothrombin Time: 30.6 seconds — ABNORMAL HIGH (ref 11.6–15.2)

## 2013-11-08 MED ORDER — IPRATROPIUM-ALBUTEROL 0.5-2.5 (3) MG/3ML IN SOLN
3.0000 mL | RESPIRATORY_TRACT | Status: DC
  Administered 2013-11-08 (×4): 3 mL via RESPIRATORY_TRACT
  Filled 2013-11-08 (×3): qty 3

## 2013-11-08 MED ORDER — IPRATROPIUM-ALBUTEROL 0.5-2.5 (3) MG/3ML IN SOLN
3.0000 mL | Freq: Three times a day (TID) | RESPIRATORY_TRACT | Status: DC
Start: 2013-11-08 — End: 2013-11-09

## 2013-11-08 MED ORDER — WARFARIN SODIUM 2.5 MG PO TABS
2.5000 mg | ORAL_TABLET | Freq: Once | ORAL | Status: AC
  Administered 2013-11-08: 2.5 mg via ORAL
  Filled 2013-11-08: qty 1

## 2013-11-08 MED ORDER — METHYLPREDNISOLONE SODIUM SUCC 40 MG IJ SOLR
40.0000 mg | Freq: Once | INTRAMUSCULAR | Status: DC
  Filled 2013-11-08: qty 1

## 2013-11-08 MED ORDER — METHYLPREDNISOLONE SODIUM SUCC 40 MG IJ SOLR
40.0000 mg | Freq: Once | INTRAMUSCULAR | Status: AC
  Administered 2013-11-08: 40 mg via INTRAVENOUS
  Filled 2013-11-08: qty 1

## 2013-11-08 MED ORDER — FUROSEMIDE 40 MG PO TABS
40.0000 mg | ORAL_TABLET | Freq: Every day | ORAL | Status: DC
  Administered 2013-11-08 – 2013-11-10 (×3): 40 mg via ORAL
  Filled 2013-11-08 (×3): qty 1

## 2013-11-08 MED ORDER — HYDRALAZINE HCL 25 MG PO TABS
25.0000 mg | ORAL_TABLET | Freq: Three times a day (TID) | ORAL | Status: DC
  Administered 2013-11-08 – 2013-11-11 (×10): 25 mg via ORAL
  Filled 2013-11-08 (×13): qty 1

## 2013-11-08 NOTE — Progress Notes (Signed)
ANTICOAGULATION CONSULT NOTE - Pharmacy Consult for Warfarin  Indication: AVR, afib  Allergies  Allergen Reactions  . Adhesive [Tape] Itching, Rash and Other (See Comments)    Redness and skin breaks down  . Latex Rash  . Neomycin-Bacitracin Zn-Polymyx Rash   Patient Measurements: Height: 5\' 1"  (154.9 cm) Weight: 186 lb 4.6 oz (84.5 kg) IBW/kg (Calculated) : 47.8  Vital Signs: Temp: 97.6 F (36.4 C) (02/10 0827) Temp src: Oral (02/10 0827) BP: 145/84 mmHg (02/10 0827) Pulse Rate: 72 (02/10 0827)  Labs:  Recent Labs  11/05/13 1015 11/05/13 1230 11/06/13 0326 11/07/13 0458 11/08/13 0245  HGB  --   --  14.5 14.9  --   HCT  --   --  42.6 43.4  --   PLT  --   --  182 227  --   LABPROT  --   --  20.9* 27.3* 30.6*  INR  --   --  1.86* 2.64* 3.07*  CREATININE  --  1.40* 1.73* 1.54*  --   TROPONINI <0.30  --   --   --   --    Estimated Creatinine Clearance: 33.1 ml/min (by C-G formula based on Cr of 1.54).  Medical History: Past Medical History  Diagnosis Date  . History of ventricular tachycardia   . Peripheral edema   . Aortic insufficiency     Moderate 2012  . History of gout   . Exogenous obesity   . History of CHF (congestive heart failure)     EF 45% 2012, nonischemic (Normal coronaries 2002)  . 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 Hypertension   . 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 ICD (implantable cardiac defibrillator) in place 2012  . DOE (dyspnea on exertion)   . Stroke 2000  . Renal insufficiency   . Anxiety   . 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 Anemia   . Presence of IVC filter 2000  . Osteopenia 09/01/2013  . Coronary artery disease   . Myocardial infarction 2000  . Pacemaker   . CHF (congestive heart failure)    Medications:  Warfarin 5 mg on Tues/Thu, 2.5 mg all other days, INR 1.66 on admit  Assessment: 73 y/o F presents  02/06 with pulmonary edema vs PNA. Pharmacy consulted to continue warfarin  for afib/AVR.  Coag: INR 3.07 this AM. Trend up on home dose,  CBC stable, plts wnl. No bleeding noted.   Patient on long standing warfarin, on current home dose has been at low end of therapeutic range for some time, followed up with Dr. Patty Sermons.  No concerns or questions outside concerns of difficulty keeping track of multiple medication changes.    Goal of Therapy:  INR 2-3 Monitor platelets by anticoagulation protocol: Yes   Plan:  -Warfarin 2.5mg  po x 1 tonight  -Daily PT/INR -Monitor for bleeding   Thank you for allowing pharmacy to be a part of this patients care team.  Lovenia Kim Pharm.D., BCPS Clinical Pharmacist 11/08/2013 8:41 AM Pager: [phone] Phone: [phone]

## 2013-11-08 NOTE — Progress Notes (Signed)
   CARE MANAGEMENT NOTE 11/08/2013  Patient:  FORRESTINE, REDEKER   Account Number:  [id number]  Date Initiated:  11/08/2013  Documentation initiated by:  [facility]  Subjective/Objective Assessment:   CHF, acute resp failure     Action/Plan:   lives at home with dtr, Sonny Dandy # [phone]   Anticipated DC Date:     Anticipated DC Plan:  HOME W HOME HEALTH SERVICES      DC Planning Services  CM consult      [facility]  HOME HEALTH   Choice offered to / List presented to:  C-1 Patient           Status of service:  In process, will continue to follow Medicare Important Message given?   (If response is "NO", the following Medicare IM given date fields will be blank) Date Medicare IM given:   Date Additional Medicare IM given:    Discharge Disposition:    Per UR Regulation:    If discussed at Long Length of Stay Meetings, dates discussed:    Comments:  11/08/2013 1130 NCM spoke to pt and offered choice for [facility]. Requested [facility] for [facility]. Pt states she has RW, wheelchair, and cane. Lives at home with dtr and was independent at home. PT recommended HH PT and pt may benefit from [facility] RN. Waiting final recommendations from attending MD. Isidoro Donning RN CCM Case Mgmt phone [phone]

## 2013-11-08 NOTE — Progress Notes (Signed)
    Subjective:  Pt ok if still. With movement c/o coughing spells. No chest pain. Short of breath with much activity. No orthopnea.  Objective:  Vital Signs in the last 24 hours: Temp:  [97.2 F (36.2 C)-97.9 F (36.6 C)] 97.9 F (36.6 C) (02/10 0400) Pulse Rate:  [71-77] 71 (02/10 0400) Resp:  [18-27] 18 (02/10 0400) BP: (117-185)/(69-113) 158/99 mmHg (02/10 0400) SpO2:  [91 %-98 %] 94 % (02/10 0400) Weight:  [186 lb 4.6 oz (84.5 kg)] 186 lb 4.6 oz (84.5 kg) (02/10 0400)  Intake/Output from previous day: 02/09 0701 - 02/10 0700 In: -  Out: 1336 [Urine:1335; Stool:1]  Physical Exam: Pt is alert and oriented, NAD HEENT: normal Neck: JVP - normal Lungs: diffuse rhonchi and wheezing CV: Irregularly irregular without murmur or gallop Abd: soft, NT, Positive BS, no hepatomegaly Ext: no C/C/E, distal pulses intact and equal Skin: warm/dry no rash   Lab Results:  Recent Labs  11/06/13 0326 11/07/13 0458  WBC 9.5 13.2*  HGB 14.5 14.9  PLT 182 227    Recent Labs  11/06/13 0326 11/07/13 0458  NA 139 140  K 4.6 4.6  CL 97 100  CO2 24 25  GLUCOSE 169* 124*  BUN 45* 52*  CREATININE 1.73* 1.54*    Recent Labs  11/05/13 1015  TROPONINI <0.30    Tele: Atrial fib with heart rate in the 70s  Assessment/Plan:  1. Acute on chronic mixed CHF - in setting of viral URI. Pt is back on oral furosemide, will repeat BMET tomorrow am. Volume status looks ok.   2. Malignant HTN - BP better but markedly elevated again this morning. Norvasc increased and imdur added yesterday. Remains on bisoprolol. Will add low-dose hydralazine today. Monitor in [place] another 24 hours before transfer out to floor.   3. Atrial fib - heart rate well controlled. INR therapeutic on warfarin.  4. CKD - stabilized after mild AKI  Dispo: anticipate transfer out to tele next 24 hours. Work on BP control as above. Most symptomatic from URI with continued active wheezing and coughing.  Appreciate [facility] help.  Tonny Bollman, M.D. 11/08/2013, 7:42 AM

## 2013-11-08 NOTE — Progress Notes (Signed)
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 [name]. She has a bioprosthetic valve. Systolic Heart Failure EF 45 %. Who presents to ED with 3 days history of  subjective fevers, chills and cough. Chest x ray  showed bilateral interstitial infiltrates felt secondary to pulmonary edema or an infectious or inflammatory etiology with no focal regions consolidation. Her BNP was elevated at 3000. Cardiology admitted patient for CHF exacerbation. We were consulted to help with URI, bronchitis and medical care.   1-Acute respiratory failure  -Multifactorial due to acute on chronic systolic congestive heart failure and probably upper respiratory viral illness.  -Patient's acute on chronic congestive heart failure is being managed by cardiology.  -influenza negative, no fevers, tamiflu discontinue.   -Repeat chest x ray 2-8 negative for infiltrates, no pulmonary edema.  -Significant wheezing this morning. Will continue with prednisone but will give one time dose IV solumedrol today.  -I will resume nebulizer treatments, schedule. For some reason this was discontinue.   Acute on chronic systolic congestive heart failure  Management as per cardiology. Patient's carvedilol held likely due to wheezing. Not on ACE inhibitor or ARB, likely due to chronic kidney disease stage III.  Lasix started, 40 mg daily.   Uncontrolled hypertension  Management as per cardiology.  Continue with Norvasc, bisoprolol.  Started on Imdur, and hydralazine 2-10.   Chronic kidney disease stage III Cr baseline 1.4 to 1.5.  Worsening renal function, suspect secondary to diuresis.  Cr peak to 1.7 during this admission. Cr has decrease to 1.2. Resume lasix 40 mg daily.   Hyperlipidemia  Stable.   Atrial  fibrillation on chronic anticoagulation  Rate controlled. Continue Coumadin.  Hyperglycemia; secondary to steroids.  Constipation; resolved. Had BM 2-09.  Disposition;will order PT, OT consult.   Procedures:  none  Antibiotics:  none  HPI/Subjective: She was able to have a BM. She feels dyspnea is on and off, worse on exertion. She has significant wheezing this morning on lung exam.   Objective: Filed Vitals:   11/08/13 0746  BP: 197/111  Pulse:   Temp:   Resp:     Intake/Output Summary (Last 24 hours) at 11/08/13 0824 Last data filed at 11/08/13 0500  Gross per 24 hour  Intake      0 ml  Output   1335 ml  Net  -1335 ml   Filed Weights   11/06/13 0400 11/07/13 0400 11/08/13 0400  Weight: 85 kg (187 lb 6.3 oz) 84.9 kg (187 lb 2.7 oz) 84.5 kg (186 lb 4.6 oz)    Exam:   General:  No distress.   Cardiovascular: S 1, S 2 RRR  Respiratory: Bilateral expiratory  wheezes, bilateral crackles.   Abdomen: bs present, soft, nt  Musculoskeletal: Trace edema.   Data Reviewed: Basic Metabolic Panel:  Recent Labs Lab 11/05/13 0027 11/05/13 0458 11/05/13 1230 11/06/13 0326 11/07/13 0458  NA 141 141 140 139 140  K 3.9 4.5 4.3 4.6 4.6  CL 96 98 96 97 100  CO2 27 23 23 24 25   GLUCOSE 95 121* 173* 169* 124*  BUN 21 24* 29* 45* 52*  CREATININE 1.35* 1.29* 1.40* 1.73* 1.54*  CALCIUM 8.7 8.8 8.8 9.0 9.0  MG 2.2  --   --   --   --    Liver  Function Tests:  Recent Labs Lab 11/04/13 1824 11/05/13 0027  AST 24 22  ALT 12 12  ALKPHOS 86 84  BILITOT 0.5 0.6  PROT 8.1 8.2  ALBUMIN 4.0 3.7   No results found for this basename: LIPASE, AMYLASE,  in the last 168 hours No results found for this basename: AMMONIA,  in the last 168 hours CBC:  Recent Labs Lab 11/04/13 1824 11/05/13 0027 11/06/13 0326 11/07/13 0458  WBC 7.7 8.1 9.5 13.2*  NEUTROABS 4.7 4.6  --   --   HGB 14.5 15.0 14.5 14.9  HCT 44.4 44.3 42.6 43.4  MCV 95.1 95.1 93.4 92.9  PLT 156 161  182 227   Cardiac Enzymes:  Recent Labs Lab 11/04/13 1824 11/05/13 0045 11/05/13 0458 11/05/13 1015  TROPONINI <0.30 <0.30 <0.30 <0.30   BNP (last 3 results)  Recent Labs  11/04/13 1824 11/05/13 0045  PROBNP 3503.0* 3807.0*   CBG:  Recent Labs Lab 11/06/13 2130 11/07/13 0825 11/07/13 1204 11/07/13 1655 11/07/13 2119  GLUCAP 168* 106* 116* 142* 142*    Recent Results (from the past 240 hour(s))  MRSA PCR SCREENING     Status: None   Collection Time    11/04/13  9:29 PM      Result Value Range Status   MRSA by PCR NEGATIVE  NEGATIVE Final   Comment:            The GeneXpert MRSA Assay (FDA     approved for NASAL specimens     only), is one component of a     comprehensive MRSA colonization     surveillance program. It is not     intended to diagnose MRSA     infection nor to guide or     monitor treatment for     MRSA infections.     Studies: No results found.  Scheduled Meds: . allopurinol  100 mg Oral BID  . amLODipine  10 mg Oral Daily  . atorvastatin  10 mg Oral q1800  . bisoprolol  5 mg Oral BID  . furosemide  40 mg Oral Daily  . guaiFENesin  600 mg Oral BID  . hydrALAZINE  25 mg Oral Q8H  . insulin aspart  0-9 Units Subcutaneous TID WC  . ipratropium-albuterol  3 mL Nebulization Q4H  . isosorbide mononitrate  30 mg Oral Daily  . methylPREDNISolone (SOLU-MEDROL) injection  40 mg Intravenous Once  . polyethylene glycol  17 g Oral BID  . predniSONE  60 mg Oral Q breakfast  . sodium chloride  3 mL Intravenous Q12H  . sodium phosphate  1 enema Rectal Once  . 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     Zanna Hawn  [facility] Pager [phone]. If 7PM-7AM, please contact night-coverage at [email or web address], password [email or web address] 11/08/2013, 8:24 AM  LOS: 4 days

## 2013-11-09 LAB — CBC
HCT: 43.5 % (ref 36.0–46.0)
Hemoglobin: 14.7 g/dL (ref 12.0–15.0)
MCH: 31.4 pg (ref 26.0–34.0)
MCHC: 33.8 g/dL (ref 30.0–36.0)
MCV: 92.9 fL (ref 78.0–100.0)
PLATELETS: 242 10*3/uL (ref 150–400)
RBC: 4.68 MIL/uL (ref 3.87–5.11)
RDW: 14 % (ref 11.5–15.5)
WBC: 6.7 10*3/uL (ref 4.0–10.5)

## 2013-11-09 LAB — BASIC METABOLIC PANEL
BUN: 56 mg/dL — ABNORMAL HIGH (ref 6–23)
CALCIUM: 8.8 mg/dL (ref 8.4–10.5)
CO2: 22 meq/L (ref 19–32)
CREATININE: 1.52 mg/dL — AB (ref 0.50–1.10)
Chloride: 100 mEq/L (ref 96–112)
GFR calc non Af Amer: 33 mL/min — ABNORMAL LOW (ref >90)
GFR, EST AFRICAN AMERICAN: 39 mL/min — AB (ref >90)
Glucose, Bld: 125 mg/dL — ABNORMAL HIGH (ref 70–99)
Potassium: 5.1 mEq/L (ref 3.7–5.3)
Sodium: 139 mEq/L (ref 137–147)

## 2013-11-09 LAB — PROTIME-INR
INR: 3.32 — ABNORMAL HIGH (ref 0.00–1.49)
Prothrombin Time: 32.5 seconds — ABNORMAL HIGH (ref 11.6–15.2)

## 2013-11-09 LAB — GLUCOSE, CAPILLARY
GLUCOSE-CAPILLARY: 104 mg/dL — AB (ref 70–99)
GLUCOSE-CAPILLARY: 133 mg/dL — AB (ref 70–99)
Glucose-Capillary: 120 mg/dL — ABNORMAL HIGH (ref 70–99)
Glucose-Capillary: 213 mg/dL — ABNORMAL HIGH (ref 70–99)

## 2013-11-09 LAB — HEMOGLOBIN A1C
Hgb A1c MFr Bld: 5.6 % (ref <5.7)
Mean Plasma Glucose: 114 mg/dL (ref <117)

## 2013-11-09 MED ORDER — IPRATROPIUM BROMIDE 0.02 % IN SOLN
0.5000 mg | Freq: Three times a day (TID) | RESPIRATORY_TRACT | Status: DC
  Administered 2013-11-09 – 2013-11-11 (×7): 0.5 mg via RESPIRATORY_TRACT
  Filled 2013-11-09 (×7): qty 2.5

## 2013-11-09 MED ORDER — IPRATROPIUM BROMIDE 0.02 % IN SOLN: 0.5000 mg | Freq: Four times a day (QID) | RESPIRATORY_TRACT | Status: DC

## 2013-11-09 MED ORDER — PANTOPRAZOLE SODIUM 40 MG PO TBEC
40.0000 mg | DELAYED_RELEASE_TABLET | Freq: Every day | ORAL | Status: DC
  Administered 2013-11-09 – 2013-11-11 (×3): 40 mg via ORAL
  Filled 2013-11-09 (×3): qty 1

## 2013-11-09 MED ORDER — ISOSORBIDE MONONITRATE ER 60 MG PO TB24
60.0000 mg | ORAL_TABLET | Freq: Every day | ORAL | Status: DC
  Administered 2013-11-10 – 2013-11-11 (×2): 60 mg via ORAL
  Filled 2013-11-09 (×2): qty 1

## 2013-11-09 MED ORDER — IPRATROPIUM BROMIDE 0.02 % IN SOLN
0.5000 mg | RESPIRATORY_TRACT | Status: DC
  Administered 2013-11-09: 0.5 mg via RESPIRATORY_TRACT
  Filled 2013-11-09: qty 2.5

## 2013-11-09 MED ORDER — METHYLPREDNISOLONE SODIUM SUCC 125 MG IJ SOLR
60.0000 mg | Freq: Four times a day (QID) | INTRAMUSCULAR | Status: DC
  Administered 2013-11-09 – 2013-11-10 (×4): 60 mg via INTRAVENOUS
  Filled 2013-11-09 (×2): qty 0.96
  Filled 2013-11-09: qty 2
  Filled 2013-11-09 (×3): qty 0.96
  Filled 2013-11-09: qty 2
  Filled 2013-11-09: qty 0.96
  Filled 2013-11-09: qty 2
  Filled 2013-11-09 (×2): qty 0.96

## 2013-11-09 MED ORDER — LEVALBUTEROL HCL 0.63 MG/3ML IN NEBU: 0.6300 mg | INHALATION_SOLUTION | Freq: Four times a day (QID) | RESPIRATORY_TRACT | Status: DC

## 2013-11-09 MED ORDER — GUAIFENESIN ER 600 MG PO TB12: 1200.0000 mg | ORAL_TABLET | Freq: Two times a day (BID) | ORAL | Status: DC

## 2013-11-09 MED ORDER — LEVOFLOXACIN 500 MG PO TABS
500.0000 mg | ORAL_TABLET | ORAL | Status: DC
  Administered 2013-11-09 – 2013-11-11 (×2): 500 mg via ORAL
  Filled 2013-11-09 (×2): qty 1

## 2013-11-09 MED ORDER — LEVALBUTEROL HCL 0.63 MG/3ML IN NEBU
0.6300 mg | INHALATION_SOLUTION | Freq: Three times a day (TID) | RESPIRATORY_TRACT | Status: DC
  Administered 2013-11-09 – 2013-11-11 (×7): 0.63 mg via RESPIRATORY_TRACT
  Filled 2013-11-09 (×12): qty 3

## 2013-11-09 MED ORDER — LEVALBUTEROL HCL 0.63 MG/3ML IN NEBU
0.6300 mg | INHALATION_SOLUTION | RESPIRATORY_TRACT | Status: DC
  Administered 2013-11-09: 0.63 mg via RESPIRATORY_TRACT
  Filled 2013-11-09: qty 3

## 2013-11-09 MED ORDER — MOMETASONE FURO-FORMOTEROL FUM 100-5 MCG/ACT IN AERO
2.0000 | INHALATION_SPRAY | Freq: Two times a day (BID) | RESPIRATORY_TRACT | Status: DC
  Administered 2013-11-09 – 2013-11-11 (×4): 2 via RESPIRATORY_TRACT
  Filled 2013-11-09: qty 8.8

## 2013-11-09 MED ORDER — GUAIFENESIN-CODEINE 100-10 MG/5ML PO SOLN
10.0000 mL | Freq: Four times a day (QID) | ORAL | Status: DC | PRN
  Administered 2013-11-09 (×2): 10 mL via ORAL
  Filled 2013-11-09 (×3): qty 10

## 2013-11-09 NOTE — Progress Notes (Signed)
    Subjective:  No chest pain. Dyspnea stable. Still coughing with position changes and activity. BP remains high.  Objective:  Vital Signs in the last 24 hours: Temp:  [97.5 F (36.4 C)-98 F (36.7 C)] 97.7 F (36.5 C) (02/11 1136) Pulse Rate:  [68-87] 72 (02/11 1136) Resp:  [16-22] 17 (02/11 1136) BP: (124-167)/(68-117) 166/113 mmHg (02/11 1136) SpO2:  [95 %-97 %] 95 % (02/11 1136) Weight:  [187 lb 2.7 oz (84.9 kg)] 187 lb 2.7 oz (84.9 kg) (02/11 0400)  Intake/Output from previous day: 02/10 0701 - 02/11 0700 In: -  Out: 151 [Urine:150; Stool:1]  Physical Exam: Pt is alert and oriented, NAD HEENT: normal Neck: JVP - normal Lungs: diffuse rhonchi and wheezing CV: irregularly irregular without murmur or gallop Abd: soft, NT, Positive BS, no hepatomegaly Ext: no C/C/E, distal pulses intact and equal, legs tender to palpation Skin: warm/dry no rash   Lab Results:  Recent Labs  11/07/13 0458 11/09/13 0215  WBC 13.2* 6.7  HGB 14.9 14.7  PLT 227 242    Recent Labs  11/08/13 0930 11/09/13 0215  NA 143 139  K 4.0 5.1  CL 103 100  CO2 23 22  GLUCOSE 177* 125*  BUN 49* 56*  CREATININE 1.22* 1.52*   No results found for this basename: TROPONINI, CK, MB,  in the last 72 hours  Tele: Atrial fibrillation heart rate 70's  Assessment/Plan:  1. Acute on chronic mixed CHF: stable and appears euvolemic. Back on oral furosemide.   2. Malignant HTN. Still a major issue. Diastolic BP's over 100 mmHg. Possible that change from carvedilol to bisoprolol has had big impact, but still with active wheezing I think best to avoid nonselective beta-blockade. Hydralazine/amlodipine/imdur added. Will increase hydralazine today. Steroids for treatment of wheezing/ashmatic bronchitis may be driving this.  3. Atrial fib: good rate control. Warfarin management per pharmacy.  4. CKD: stable.   Dispo: tx tele today. Anticipate d/c home next 48 hours. Will ask case manager to set up  home health services. She has had several med changes and will need some help with med admin/CHF management.  Tonny Bollman, M.D. 11/09/2013, 12:13 PM

## 2013-11-09 NOTE — Progress Notes (Signed)
Pt arrived from [facility] per w/c. VS done and placed  on telemetry monitor. Oriented to room.

## 2013-11-09 NOTE — Plan of Care (Signed)
Problem: Phase I Progression Outcomes Goal: EF % per last Echo/documented,Core Reminder form on chart Outcome: Completed/Met Date Met:  11/09/13 11/05/2013 EF 45 to 50 %

## 2013-11-09 NOTE — Progress Notes (Signed)
ANTICOAGULATION CONSULT NOTE - Follow Up Consult  Pharmacy Consult for Coumadin  Indication: afib, AVR  Allergies  Allergen Reactions  . Adhesive [Tape] Itching, Rash and Other (See Comments)    Redness and skin breaks down  . Latex Rash  . Neomycin-Bacitracin Zn-Polymyx Rash   Patient Measurements: Height: 5\' 1"  (154.9 cm) Weight: 187 lb 2.7 oz (84.9 kg) IBW/kg (Calculated) : 47.8  Vital Signs: Temp: 97.5 F (36.4 C) (02/11 0715) Temp src: Oral (02/11 0715) BP: 149/104 mmHg (02/11 0715) Pulse Rate: 75 (02/11 0715)  Labs:  Recent Labs  11/07/13 0458 11/08/13 0245 11/08/13 0930 11/09/13 0215  HGB 14.9  --   --  14.7  HCT 43.4  --   --  43.5  PLT 227  --   --  242  LABPROT 27.3* 30.6*  --  32.5*  INR 2.64* 3.07*  --  3.32*  CREATININE 1.54*  --  1.22* 1.52*    Estimated Creatinine Clearance: 33.5 ml/min (by C-G formula based on Cr of 1.52).  Assessment: 72yof continues on coumadin for afib, AVR. INR is above goal and continues to trend up. Levaquin added today so anticipate that INR will continue to increase further. CBC is stable. No bleeding reported.  Goal of Therapy:  INR 2-3 Monitor platelets by anticoagulation protocol: Yes   Plan:  1) No coumadin tonight 2) INR in AM  Fredrik Rigger 11/09/2013,10:13 AM

## 2013-11-09 NOTE — Evaluation (Signed)
Occupational Therapy Evaluation Patient Details Name: Heather AmmonsJeritta R Stanzione MRN: [id number] DOB: 04/11/1941 Today's Date: 11/09/2013 Time: [id number] OT Time Calculation (min): 29 min  OT Assessment / Plan / Recommendation History of present illness Pt admit with CHF.   Clinical Impression   Pt presents with decreased activity tolerance, generalized weakness, and decreased balance interfering with ability to perform ADL and ADL transfers independently.  Will follow to instruct in ADL and energy conservation strategies.  Pt is well equipped at home and feels she will be able to manage while her daughter is at work.      OT Assessment  Patient needs continued OT Services    Follow Up Recommendations  No OT follow up    Barriers to Discharge      Equipment Recommendations  None recommended by OT    Recommendations for Other Services    Frequency  Min 2X/week    Precautions / Restrictions Precautions Precautions: Fall Restrictions Weight Bearing Restrictions: No   Pertinent Vitals/Pain VSS, see flow sheet, no pain reported    ADL  Eating/Feeding: Independent Where Assessed - Eating/Feeding: Chair Grooming: Wash/dry hands;Wash/dry face;Set up Where Assessed - Grooming: Supported sitting Upper Body Bathing: Set up Where Assessed - Upper Body Bathing: Unsupported sitting Lower Body Bathing: Minimal assistance Where Assessed - Lower Body Bathing: Unsupported sitting;Supported sit to stand Upper Body Dressing: Set up Where Assessed - Upper Body Dressing: Unsupported sitting Lower Body Dressing: Minimal assistance Where Assessed - Lower Body Dressing: Unsupported sitting;Unsupported standing Toilet Transfer: Minimal assistance Toilet Transfer Method: Sit to stand Toilet Transfer Equipment: Extra wide bedside commode Toileting - Clothing Manipulation and Hygiene: Min guard Equipment Used: Gait belt Transfers/Ambulation Related to ADLs: min (hand held assist) within room ADL  Comments: Pt sits on EOB and pulls LEs up onto bed to donn socks when at home.  Get SOB if she bends over to dress.    OT Diagnosis: Generalized weakness  OT Problem List: Decreased strength;Decreased activity tolerance;Impaired balance (sitting and/or standing);Cardiopulmonary status limiting activity;Decreased knowledge of use of DME or AE;Obesity OT Treatment Interventions: Self-care/ADL training;Patient/family education;DME and/or AE instruction;Energy conservation   OT Goals(Current goals can be found in the care plan section) Acute Rehab OT Goals Patient Stated Goal: to go home OT Goal Formulation: With patient Time For Goal Achievement: 11/16/13 Potential to Achieve Goals: Good ADL Goals Pt Will Perform Grooming: with modified independence;standing Pt Will Perform Lower Body Bathing: with modified independence;sit to/from stand Pt Will Perform Lower Body Dressing: with modified independence;sit to/from stand Pt Will Transfer to Toilet: with modified independence;ambulating Pt Will Perform Tub/Shower Transfer: Tub transfer;with supervision;ambulating;shower seat Additional ADL Goal #1: Pt will generalize energy conservation strategies in ADL and mobility independently. Additional ADL Goal #2: Pt will gather items necessary for ADL using RW with modified independence.  Visit Information  Last OT Received On: 11/09/13 Assistance Needed: +1 History of Present Illness: Pt admit with CHF.       Prior Functioning     Home Living Family/patient expects to be discharged to:: Private residence Living Arrangements: Children Available Help at Discharge: Family;Available PRN/intermittently Type of Home: House Home Access: Level entry Home Layout: One level Home Equipment: Walker - 4 wheels;Cane - single point;Bedside commode;Shower seat;Hand held shower head;Grab bars - tub/shower;Grab bars - toilet;Wheelchair - Engineer, technical salesmanual;Wheelchair - power;Adaptive equipment Adaptive Equipment:  Reacher;Sock aid;Long-handled shoe horn;Long-handled sponge Additional Comments: Craftmatic bed Prior Function Level of Independence: Independent Comments: Drives Communication Communication: No difficulties Dominant Hand: Left  Vision/Perception Vision - History Baseline Vision: Wears glasses only for reading Patient Visual Report: No change from baseline   Cognition  Cognition Arousal/Alertness: Awake/alert Behavior During Therapy: WFL for tasks assessed/performed Overall Cognitive Status: Within Functional Limits for tasks assessed    Extremity/Trunk Assessment Upper Extremity Assessment Upper Extremity Assessment: Overall WFL for tasks assessed Lower Extremity Assessment Lower Extremity Assessment: Defer to PT evaluation Cervical / Trunk Assessment Cervical / Trunk Assessment: Normal     Mobility Bed Mobility Overal bed mobility:  (pt received up in chair) Transfers Overall transfer level: Needs assistance Transfers: Sit to/from Stand Sit to Stand: Min assist General transfer comment: Verbal cues for hand placement on arms of chair and to steady     Exercise     Balance Balance Sitting balance-Leahy Scale: Good Standing balance-Leahy Scale: Fair   End of Session OT - End of Session Activity Tolerance: Patient limited by fatigue Patient left: in chair;with call bell/phone within reach  GO     Evern Bio 11/09/2013, 11:51 AM [phone]

## 2013-11-09 NOTE — Progress Notes (Signed)
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  -influenza negative, no fevers, tamiflu discontinued, CXR 2/8 negative for infiltrates, no pulmonary edema.  - I have placed her on scheduled nebs with Xopenex and Atrovent, IV steroids, dulera, flutter valve and levaquin - Will reassess in a.m. and taper IV steroids, placed on PPI d/t high dose steroids - Cough is nonproductive, hence I discontinued Mucinex - will need DME nebs machine, will check Home O2 eval tomorrow  Acute on chronic systolic congestive heart failure  Management as per cardiology.  Uncontrolled hypertension  Management as per cardiology.   Chronic kidney disease stage III Cr baseline 1.4 to 1.5.  Worsening renal function, suspect secondary to diuresis.   Hyperlipidemia  Stable.  Atrial fibrillation on chronic anticoagulation  Rate controlled. Continue Coumadin.  Hyperglycemia; secondary to steroids.  Constipation; resolved   DVT Prophylaxis:  Code Status: Full code  Family Communication: Discussed in detail with the patient  Disposition: Transfer to telemetry    Subjective: Patient seen and examined earlier this morning, was significantly wheezing, coughing but not bringing any productive phlegm  Objective: Weight change: 0.4 kg (14.1 oz)  Intake/Output Summary (Last 24 hours) at 11/09/13 1521 Last data filed at 11/09/13 1400  Gross  per 24 hour  Intake      0 ml  Output    601 ml  Net   -601 ml   Blood pressure 166/113, pulse 72, temperature 97.7 F (36.5 C), temperature source Oral, resp. rate 17, height 5\' 1"  (1.549 m), weight 84.9 kg (187 lb 2.7 oz), SpO2 98.00%.  Physical Exam: General: Alert and awake, oriented x3 CVS: S1-S2 clear, no murmur rubs or gallops Chest: Diffuse expiratory wheezing bilaterally Abdomen: soft nontender, nondistended, normal bowel sounds  Extremities: no cyanosis, clubbing, trace edema noted bilaterally Neuro: Cranial nerves II-XII intact, no focal neurological deficits  Lab Results: Basic Metabolic Panel:  Recent Labs Lab 11/05/13 0027  11/08/13 0930 11/09/13 0215  NA 141  < > 143 139  K 3.9  < > 4.0 5.1  CL 96  < > 103 100  CO2 27  < > 23 22  GLUCOSE 95  < > 177* 125*  BUN 21  < > 49* 56*  CREATININE 1.35*  < > 1.22* 1.52*  CALCIUM 8.7  < > 8.8 8.8  MG 2.2  --   --   --   < > = values in this interval not displayed. Liver Function Tests:  Recent Labs Lab 11/04/13 1824 11/05/13 0027  AST 24 22  ALT 12 12  ALKPHOS 86 84  BILITOT 0.5 0.6  PROT 8.1 8.2  ALBUMIN 4.0 3.7   No results found for this basename: LIPASE, AMYLASE,  in the last 168 hours No results found for this basename: AMMONIA,  in the last 168 hours CBC:  Recent Labs Lab 11/05/13 0027  11/07/13 0458 11/09/13 0215  WBC 8.1  < >  13.2* 6.7  NEUTROABS 4.6  --   --   --   HGB 15.0  < > 14.9 14.7  HCT 44.3  < > 43.4 43.5  MCV 95.1  < > 92.9 92.9  PLT 161  < > 227 242  < > = values in this interval not displayed. Cardiac Enzymes:  Recent Labs Lab 11/05/13 0045 11/05/13 0458 11/05/13 1015  TROPONINI <0.30 <0.30 <0.30   BNP: No components found with this basename: POCBNP,  CBG:  Recent Labs Lab 11/08/13 1109 11/08/13 1558 11/08/13 2113 11/09/13 0711 11/09/13 1138  GLUCAP 149* 231* 170* 104* 120*     Micro Results: Recent Results (from the past 240 hour(s))  MRSA PCR SCREENING      Status: None   Collection Time    11/04/13  9:29 PM      Result Value Ref Range Status   MRSA by PCR NEGATIVE  NEGATIVE Final   Comment:            The GeneXpert MRSA Assay (FDA     approved for NASAL specimens     only), is one component of a     comprehensive MRSA colonization     surveillance program. It is not     intended to diagnose MRSA     infection nor to guide or     monitor treatment for     MRSA infections.    Studies/Results: Dg Chest 2 View  11/06/2013   CLINICAL DATA:  Follow up edema.  EXAM: CHEST  2 VIEW  COMPARISON:  11/04/2013  FINDINGS: Heart size is mildly enlarged but unchanged. Stable position of the left cardiac ICD. Stable postsurgical changes in the chest. There is no significant pulmonary edema or focal airspace disease. Negative for a pneumothorax. No significant pleural effusion.  IMPRESSION: No significant pulmonary edema and no focal chest disease.   Electronically Signed   By: Richarda OverlieAdam  Henn M.D.   On: 11/06/2013 07:47   Dg Chest 2 View  11/04/2013   CLINICAL DATA:  Shortness of breath  EXAM: CHEST  2 VIEW  COMPARISON:  DG CHEST 2 VIEW dated 08/08/2012  FINDINGS: Low lung volumes. Patient is status post median sternotomy. A left chest wall AICD single chamber is appreciated. Lead tips project in the region of the right ventricle. A prosthetic aortic valve is identified. There is prominence of the interstitial markings, and a component of peribronchial cuffing. Increased bilateral perihilar densities appreciated. No focal regions of consolidation appreciated.  IMPRESSION: Bilateral interstitial infiltrates is may reflect sequela pulmonary edema. An infectious or inflammatory etiology is also a diagnostic consideration clinically appropriate. No focal regions consolidation appreciated.   Electronically Signed   By: Salome HolmesHector  Cooper M.D.   On: 11/04/2013 17:52    Medications: Scheduled Meds: . allopurinol  100 mg Oral BID  . amLODipine  10 mg Oral Daily  .  atorvastatin  10 mg Oral q1800  . bisoprolol  5 mg Oral BID  . furosemide  40 mg Oral Daily  . hydrALAZINE  25 mg Oral 3 times per day  . insulin aspart  0-9 Units Subcutaneous TID WC  . levalbuterol  0.63 mg Nebulization TID   And  . ipratropium  0.5 mg Nebulization TID  . [START ON 11/10/2013] isosorbide mononitrate  60 mg Oral Daily  . levofloxacin  500 mg Oral Q48H  . methylPREDNISolone (SOLU-MEDROL) injection  60 mg Intravenous Q6H  . mometasone-formoterol  2 puff Inhalation BID  . polyethylene  glycol  17 g Oral BID  . sodium chloride  3 mL Intravenous Q12H  . sodium phosphate  1 enema Rectal Once  . Warfarin - Pharmacist Dosing Inpatient   Does not apply q1800      LOS: 5 days   RAI,RIPUDEEP M.D. [facility] 11/09/2013, 3:21 PM Pager: [phone]  If 7PM-7AM, please contact night-coverage [email or web address] Password [email or web address]

## 2013-11-09 NOTE — Progress Notes (Signed)
Physical Therapy Treatment Patient Details Name: [name] MRN: [id number] DOB: 1941-04-06 Today's Date: 11/09/2013 Time: 1204-1228 PT Time Calculation (min): 24 min  PT Assessment / Plan / Recommendation  History of Present Illness Pt admit with CHF.   PT Comments   Pt admitted with above. Pt currently with functional limitations due to balance and endurance deficits.  Pt will benefit from skilled PT to increase their independence and safety with mobility to allow discharge to the venue listed below.   Follow Up Recommendations  Home health PT;Supervision - Intermittent                 Equipment Recommendations  None recommended by PT        Frequency Min 3X/week   Progress towards PT Goals Progress towards PT goals: Progressing toward goals  Plan Current plan remains appropriate    Precautions / Restrictions Precautions Precautions: Fall Restrictions Weight Bearing Restrictions: No   Pertinent Vitals/Pain Sats >90% on RA with activity.  Other VSS.  No pain.    Mobility  Transfers Overall transfer level: Needs assistance Equipment used: Rolling walker (2 wheeled) Transfers: Sit to/from Stand Sit to Stand: Min guard General transfer comment: Verbal cues for hand placement on arms of chair and to steady Ambulation/Gait Ambulation/Gait assistance: Min assist Ambulation Distance (Feet): 350 Feet Assistive device: None Gait Pattern/deviations: Step-through pattern;Decreased stride length;Wide base of support;Decreased step length - right;Decreased step length - left Gait velocity interpretation: <1.8 ft/sec, indicative of risk for recurrent falls General Gait Details: Pt more steady today and not needing UE support at all times.  If challenged, pt does need UE support.  Still recommend pt use RW at home.     Exercises General Exercises - Lower Extremity Ankle Circles/Pumps: AROM;5 reps;Both;Seated Long Arc Quad: AROM;Both;5 reps;Seated Hip Flexion/Marching:  AROM;Both;10 reps;Seated   PT Goals (current goals can now be found in the care plan section)    Visit Information  Last PT Received On: 11/09/13 Assistance Needed: +1 History of Present Illness: Pt admit with CHF.    Subjective Data  Subjective: "I want to try and walk further."   Cognition  Cognition Arousal/Alertness: Awake/alert Behavior During Therapy: WFL for tasks assessed/performed Overall Cognitive Status: Within Functional Limits for tasks assessed    Balance  Balance Overall balance assessment: Needs assistance;History of Falls Sitting-balance support: Bilateral upper extremity supported;Feet supported Sitting balance-Leahy Scale: Good Postural control: Posterior lean Standing balance support: No upper extremity supported;During functional activity Standing balance-Leahy Scale: Fair Standing balance comment: Continues to need steadying support at times.   End of Session PT - End of Session Equipment Utilized During Treatment: Gait belt Activity Tolerance: Patient limited by fatigue Patient left: in chair;with call bell/phone within reach Nurse Communication: Mobility status        INGOLD,Bernal Luhman 11/09/2013, 2:16 PM [facility] [phone] (pager)

## 2013-11-10 DIAGNOSIS — I5022 Chronic systolic (congestive) heart failure: Secondary | ICD-10-CM

## 2013-11-10 DIAGNOSIS — Z95810 Presence of automatic (implantable) cardiac defibrillator: Secondary | ICD-10-CM

## 2013-11-10 DIAGNOSIS — I70219 Atherosclerosis of native arteries of extremities with intermittent claudication, unspecified extremity: Secondary | ICD-10-CM

## 2013-11-10 DIAGNOSIS — Z954 Presence of other heart-valve replacement: Secondary | ICD-10-CM

## 2013-11-10 DIAGNOSIS — J209 Acute bronchitis, unspecified: Secondary | ICD-10-CM

## 2013-11-10 DIAGNOSIS — I359 Nonrheumatic aortic valve disorder, unspecified: Secondary | ICD-10-CM

## 2013-11-10 LAB — GLUCOSE, CAPILLARY
GLUCOSE-CAPILLARY: 181 mg/dL — AB (ref 70–99)
GLUCOSE-CAPILLARY: 159 mg/dL — AB (ref 70–99)
Glucose-Capillary: 136 mg/dL — ABNORMAL HIGH (ref 70–99)
Glucose-Capillary: 155 mg/dL — ABNORMAL HIGH (ref 70–99)

## 2013-11-10 LAB — PROTIME-INR
INR: 3.63 — ABNORMAL HIGH (ref 0.00–1.49)
Prothrombin Time: 34.8 seconds — ABNORMAL HIGH (ref 11.6–15.2)

## 2013-11-10 MED ORDER — FUROSEMIDE 40 MG PO TABS
40.0000 mg | ORAL_TABLET | Freq: Two times a day (BID) | ORAL | Status: DC
  Administered 2013-11-10 – 2013-11-11 (×2): 40 mg via ORAL
  Filled 2013-11-10 (×4): qty 1

## 2013-11-10 MED ORDER — BISACODYL 10 MG RE SUPP: 10.0000 mg | Freq: Every day | RECTAL | Status: DC | PRN

## 2013-11-10 MED ORDER — METHYLPREDNISOLONE SODIUM SUCC 125 MG IJ SOLR
60.0000 mg | Freq: Two times a day (BID) | INTRAMUSCULAR | Status: DC
  Administered 2013-11-10 – 2013-11-11 (×2): 60 mg via INTRAVENOUS
  Filled 2013-11-10 (×4): qty 0.96

## 2013-11-10 MED ORDER — POLYETHYLENE GLYCOL 3350 17 G PO PACK: 17.0000 g | PACK | Freq: Every day | ORAL | Status: DC

## 2013-11-10 NOTE — Progress Notes (Signed)
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  - influenza negative, no fevers, tamiflu discontinued, CXR 2/8 negative for infiltrates, no pulmonary edema.  - cont scheduled nebs with Xopenex and Atrovent, - tapered IV steroids today, transition to oral prednisone in a.m. - Continue dulera, flutter valve and levaquin - on PPI d/t high dose steroids - will need DME nebs machine, will check Home O2 eval tomorrow  Acute on chronic systolic congestive heart failure  Management as per cardiology.  Uncontrolled hypertension  Management as per cardiology.   Chronic kidney disease stage III Cr baseline 1.4 to 1.5.  Worsening renal function, suspect secondary to diuresis.  - Recheck BMET in a.m.  Hyperlipidemia  Stable.  Atrial fibrillation on chronic anticoagulation  Rate controlled. Continue Coumadin.   Hyperglycemia; secondary to steroids.   Constipation;  Placed on MiraLax today   DVT Prophylaxis:  Code Status: Full code  Family Communication: Discussed in detail with the patient  Disposition:  hopefully DC in 24-48 hours    Subjective:  patient seen and examined, feeling significantly better from yesterday, still having wheezing, constipation issues   Objective: Weight change: -0.35 kg (-12.3 oz)  Intake/Output Summary (Last 24 hours) at 11/10/13 1233 Last data filed at 11/10/13 0900  Gross per 24 hour  Intake    720 ml  Output    800 ml  Net    -80 ml   Blood pressure 134/85, pulse 83, temperature 97.1 F (36.2 C), temperature source Oral, resp. rate 17, height 5' (1.524 m), weight 84.188 kg (185 lb 9.6 oz), SpO2 94.00%.  Physical Exam: General: Alert and awake, oriented x3 CVS: S1-S2 clear, no murmur rubs or gallops Chest:  bilateral expiratory wheezing improving  Abdomen: soft nontender, nondistended, normal bowel sounds  Extremities: no cyanosis, clubbing, trace edema noted bilaterally   Lab Results: Basic Metabolic Panel:  Recent Labs Lab 11/05/13 0027  11/08/13 0930 11/09/13 0215  NA 141  < > 143 139  K 3.9  < > 4.0 5.1  CL 96  < > 103 100  CO2 27  < > 23 22  GLUCOSE 95  < > 177* 125*  BUN 21  < > 49* 56*  CREATININE 1.35*  < > 1.22* 1.52*  CALCIUM 8.7  < > 8.8 8.8  MG 2.2  --   --   --   < > = values in this interval not displayed. Liver Function Tests:  Recent Labs Lab 11/04/13 1824 11/05/13 0027  AST 24 22  ALT 12 12  ALKPHOS 86 84  BILITOT 0.5 0.6  PROT 8.1 8.2  ALBUMIN 4.0 3.7   No results found for this basename: LIPASE, AMYLASE,  in the last 168 hours No results found for this basename: AMMONIA,  in the last 168 hours CBC:  Recent Labs Lab 11/05/13 0027  11/07/13 0458 11/09/13 0215  WBC 8.1  < > 13.2* 6.7  NEUTROABS 4.6  --   --   --   HGB 15.0  < > 14.9 14.7  HCT 44.3  < > 43.4 43.5  MCV 95.1  < > 92.9 92.9  PLT 161  < > 227 242  < > = values in this interval not displayed. Cardiac Enzymes:  Recent Labs Lab 11/05/13 0045 11/05/13 0458 11/05/13 1015  TROPONINI <0.30 <0.30 <0.30   BNP: No components found with this basename: POCBNP,  CBG:  Recent Labs Lab 11/09/13 0711 11/09/13 1138 11/09/13 1615 11/09/13 2122 11/10/13 0627  GLUCAP 104* 120* 133* 213* 136*     Micro Results: Recent Results (from the past 240 hour(s))  MRSA PCR SCREENING     Status: None   Collection Time    11/04/13  9:29 PM       Result Value Ref Range Status   MRSA by PCR NEGATIVE  NEGATIVE Final   Comment:            The GeneXpert MRSA Assay (FDA     approved for NASAL specimens     only), is one component of a     comprehensive MRSA colonization     surveillance program. It is not     intended to diagnose MRSA     infection nor to guide or     monitor treatment for     MRSA infections.    Studies/Results: Dg Chest 2 View  11/06/2013   CLINICAL DATA:  Follow up edema.  EXAM: CHEST  2 VIEW  COMPARISON:  11/04/2013  FINDINGS: Heart size is mildly enlarged but unchanged. Stable position of the left cardiac ICD. Stable postsurgical changes in the chest. There is no significant pulmonary edema or focal airspace disease. Negative for a pneumothorax. No significant pleural effusion.  IMPRESSION: No significant pulmonary edema and no focal chest disease.   Electronically Signed   By: Richarda OverlieAdam  Henn M.D.   On: 11/06/2013 07:47   Dg Chest 2 View  11/04/2013   CLINICAL DATA:  Shortness of breath  EXAM: CHEST  2 VIEW  COMPARISON:  DG CHEST 2 VIEW dated 08/08/2012  FINDINGS: Low lung volumes. Patient is status post median sternotomy. A left chest wall AICD single chamber is appreciated. Lead tips project in the region of the right ventricle. A prosthetic aortic valve is identified. There is prominence of the interstitial markings, and a component of peribronchial cuffing. Increased bilateral perihilar densities appreciated. No focal regions of consolidation appreciated.  IMPRESSION: Bilateral interstitial infiltrates is may reflect sequela pulmonary edema. An infectious or inflammatory etiology is also a diagnostic consideration clinically appropriate. No focal regions consolidation appreciated.   Electronically Signed   By: Salome HolmesHector  Cooper M.D.   On: 11/04/2013 17:52    Medications: Scheduled Meds: . allopurinol  100 mg Oral BID  . amLODipine  10 mg Oral Daily  . atorvastatin  10 mg Oral q1800  . bisoprolol  5 mg Oral BID  .  furosemide  40 mg Oral Daily  . hydrALAZINE  25 mg Oral 3 times per day  . insulin aspart  0-9 Units Subcutaneous TID WC  . levalbuterol  0.63 mg Nebulization TID   And  . ipratropium  0.5 mg Nebulization TID  . isosorbide mononitrate  60 mg Oral Daily  . levofloxacin  500 mg Oral Q48H  . methylPREDNISolone (SOLU-MEDROL) injection  60 mg Intravenous Q12H  . mometasone-formoterol  2 puff Inhalation BID  . pantoprazole  40 mg Oral Q0600  .  polyethylene glycol  17 g Oral BID  . sodium chloride  3 mL Intravenous Q12H  . sodium phosphate  1 enema Rectal Once  . Warfarin - Pharmacist Dosing Inpatient   Does not apply q1800      LOS: 6 days   RAI,RIPUDEEP M.D. [facility] 11/10/2013, 12:33 PM Pager: [phone]  If 7PM-7AM, please contact night-coverage [email or web address] Password [email or web address]

## 2013-11-10 NOTE — Progress Notes (Signed)
ANTICOAGULATION CONSULT NOTE - Follow Up Consult  Pharmacy Consult for Coumadin  Indication: afib, AVR  Allergies  Allergen Reactions  . Adhesive [Tape] Itching, Rash and Other (See Comments)    Redness and skin breaks down  . Latex Rash  . Neomycin-Bacitracin Zn-Polymyx Rash   Patient Measurements: Height: 5' (152.4 cm) (scale C) Weight: 185 lb 9.6 oz (84.188 kg) IBW/kg (Calculated) : 45.5  Vital Signs: Temp: 97.1 F (36.2 C) (02/12 1033) Temp src: Oral (02/12 1033) BP: 134/85 mmHg (02/12 1033) Pulse Rate: 83 (02/12 1033)  Labs:  Recent Labs  11/08/13 0245 11/08/13 0930 11/09/13 0215 11/10/13 0645  HGB  --   --  14.7  --   HCT  --   --  43.5  --   PLT  --   --  242  --   LABPROT 30.6*  --  32.5* 34.8*  INR 3.07*  --  3.32* 3.63*  CREATININE  --  1.22* 1.52*  --     Estimated Creatinine Clearance: 32.7 ml/min (by C-G formula based on Cr of 1.52).  Assessment: 72yof continues on Coumadin for afib, AVR. INR is above goal and continues to trend up. Levaquin added which is likely contributing to increased INR. No bleeding reported, CBC stable.  Goal of Therapy:  INR 2-3 Monitor platelets by anticoagulation protocol: Yes   Plan:  1) No Coumadin tonight 2) INR in AM  [facility], [place].D., BCPS Clinical Pharmacist Pager: [phone] 11/10/2013 11:13 AM

## 2013-11-10 NOTE — Progress Notes (Addendum)
Subjective:  Less SOB  Objective:  Temp:  [97 F (36.1 C)-97.5 F (36.4 C)] 97.3 F (36.3 C) (02/12 1323) Pulse Rate:  [59-84] 59 (02/12 1323) Resp:  [17-18] 18 (02/12 1323) BP: (127-178)/(71-111) 127/111 mmHg (02/12 1323) SpO2:  [94 %-98 %] 96 % (02/12 1323) Weight:  [185 lb 9.6 oz (84.188 kg)-186 lb 6.4 oz (84.55 kg)] 185 lb 9.6 oz (84.188 kg) (02/12 0654) Weight change: -12.3 oz (-0.35 kg)  Intake/Output from previous day: 02/11 0701 - 02/12 0700 In: 480 [P.O.:480] Out: 800 [Urine:800]  Intake/Output from this shift: Total I/O In: 240 [P.O.:240] Out: -   Physical Exam: General appearance: alert and no distress Neck: no adenopathy, no carotid bruit, no JVD, supple, symmetrical, trachea midline and thyroid not enlarged, symmetric, no tenderness/mass/nodules Lungs: scattered wheezes, crackles Right base Heart: irregularly irregular rhythm Extremities: extremities normal, atraumatic, no cyanosis or edema  Lab Results: Results for orders placed during the hospital encounter of 11/04/13 (from the past 48 hour(s))  GLUCOSE, CAPILLARY     Status: Abnormal   Collection Time    11/08/13  3:58 PM      Result Value Ref Range   Glucose-Capillary 231 (*) 70 - 99 mg/dL  GLUCOSE, CAPILLARY     Status: Abnormal   Collection Time    11/08/13  9:13 PM      Result Value Ref Range   Glucose-Capillary 170 (*) 70 - 99 mg/dL  PROTIME-INR     Status: Abnormal   Collection Time    11/09/13  2:15 AM      Result Value Ref Range   Prothrombin Time 32.5 (*) 11.6 - 15.2 seconds   INR 3.32 (*) 0.00 - 6.06  BASIC METABOLIC PANEL     Status: Abnormal   Collection Time    11/09/13  2:15 AM      Result Value Ref Range   Sodium 139  137 - 147 mEq/L   Potassium 5.1  3.7 - 5.3 mEq/L   Comment: DELTA CHECK NOTED   Chloride 100  96 - 112 mEq/L   CO2 22  19 - 32 mEq/L   Glucose, Bld 125 (*) 70 - 99 mg/dL   BUN 56 (*) 6 - 23 mg/dL   Creatinine, Ser 1.52 (*) 0.50 - 1.10 mg/dL   Calcium  8.8  8.4 - 10.5 mg/dL   GFR calc non Af Amer 33 (*) >90 mL/min   GFR calc Af Amer 39 (*) >90 mL/min   Comment: (NOTE)     The eGFR has been calculated using the CKD EPI equation.     This calculation has not been validated in all clinical situations.     eGFR's persistently <90 mL/min signify possible Chronic Kidney     Disease.  CBC     Status: None   Collection Time    11/09/13  2:15 AM      Result Value Ref Range   WBC 6.7  4.0 - 10.5 K/uL   RBC 4.68  3.87 - 5.11 MIL/uL   Hemoglobin 14.7  12.0 - 15.0 g/dL   HCT 43.5  36.0 - 46.0 %   MCV 92.9  78.0 - 100.0 fL   MCH 31.4  26.0 - 34.0 pg   MCHC 33.8  30.0 - 36.0 g/dL   RDW 14.0  11.5 - 15.5 %   Platelets 242  150 - 400 K/uL  HEMOGLOBIN A1C     Status: None   Collection Time  11/09/13  2:15 AM      Result Value Ref Range   Hemoglobin A1C 5.6  <5.7 %   Comment: (NOTE)                                                                               According to the ADA Clinical Practice Recommendations for 2011, when     HbA1c is used as a screening test:      >=6.5%   Diagnostic of Diabetes Mellitus               (if abnormal result is confirmed)     5.7-6.4%   Increased risk of developing Diabetes Mellitus     References:Diagnosis and Classification of Diabetes Mellitus,Diabetes     Care,2011,34(Suppl 1):S62-S69 and Standards of Medical Care in             Diabetes - 2011,Diabetes Care,2011,34 (Suppl 1):S11-S61.   Mean Plasma Glucose 114  <117 mg/dL   Comment: Performed at [facility]  GLUCOSE, CAPILLARY     Status: Abnormal   Collection Time    11/09/13  7:11 AM      Result Value Ref Range   Glucose-Capillary 104 (*) 70 - 99 mg/dL  GLUCOSE, CAPILLARY     Status: Abnormal   Collection Time    11/09/13 11:38 AM      Result Value Ref Range   Glucose-Capillary 120 (*) 70 - 99 mg/dL  GLUCOSE, CAPILLARY     Status: Abnormal   Collection Time    11/09/13  4:15 PM      Result Value Ref Range   Glucose-Capillary 133  (*) 70 - 99 mg/dL   Comment 1 Documented in Chart     Comment 2 Notify RN    GLUCOSE, CAPILLARY     Status: Abnormal   Collection Time    11/09/13  9:22 PM      Result Value Ref Range   Glucose-Capillary 213 (*) 70 - 99 mg/dL   Comment 1 Documented in Chart     Comment 2 Notify RN    GLUCOSE, CAPILLARY     Status: Abnormal   Collection Time    11/10/13  6:27 AM      Result Value Ref Range   Glucose-Capillary 136 (*) 70 - 99 mg/dL  PROTIME-INR     Status: Abnormal   Collection Time    11/10/13  6:45 AM      Result Value Ref Range   Prothrombin Time 34.8 (*) 11.6 - 15.2 seconds   INR 3.63 (*) 0.00 - 1.49    Imaging: Imaging results have been reviewed  Assessment/Plan:   1. Active Problems: 2.   HYPERLIPIDEMIA 3.   HYPERTENSION 4.   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   Acute CHF 6.   Acute on chronic systolic CHF (congestive heart failure) 7.   CKD (chronic kidney disease), stage III 8.   A-fib 9.   Acute respiratory failure 10.   Hypertensive urgency, malignant 11.   Acute bronchitis 12.   Time Spent Directly with Patient:  20 minutes  Length of Stay:  LOS: 6 days   Feeling better. Diuresed >3L. AFIB  with CVR. On coumadin ACV with supra therapeutic INR (followed by pharm). +TRH following for pulm. Flu neg. She received steroids and nebs which I would continue. Will increase lasix to BID, check BNP, [facility]. Prob home 24-48 hrs. HTN under better control.  ' Kadi Hession J 11/10/2013, 1:44 PM

## 2013-11-10 NOTE — Progress Notes (Signed)
Nurse Tech reported patient's BP is 127/111, BP checked manually and is 134/82; will continue to monitor patient. Lorretta Harp RN

## 2013-11-11 DIAGNOSIS — I5023 Acute on chronic systolic (congestive) heart failure: Secondary | ICD-10-CM

## 2013-11-11 LAB — BASIC METABOLIC PANEL
BUN: 55 mg/dL — AB (ref 6–23)
CO2: 27 meq/L (ref 19–32)
CREATININE: 1.54 mg/dL — AB (ref 0.50–1.10)
Calcium: 8.8 mg/dL (ref 8.4–10.5)
Chloride: 99 mEq/L (ref 96–112)
GFR calc Af Amer: 38 mL/min — ABNORMAL LOW (ref >90)
GFR calc non Af Amer: 33 mL/min — ABNORMAL LOW (ref >90)
Glucose, Bld: 187 mg/dL — ABNORMAL HIGH (ref 70–99)
Potassium: 4.1 mEq/L (ref 3.7–5.3)
Sodium: 143 mEq/L (ref 137–147)

## 2013-11-11 LAB — GLUCOSE, CAPILLARY
Glucose-Capillary: 132 mg/dL — ABNORMAL HIGH (ref 70–99)
Glucose-Capillary: 180 mg/dL — ABNORMAL HIGH (ref 70–99)

## 2013-11-11 LAB — PROTIME-INR
INR: 3.23 — ABNORMAL HIGH (ref 0.00–1.49)
Prothrombin Time: 31.8 seconds — ABNORMAL HIGH (ref 11.6–15.2)

## 2013-11-11 MED ORDER — WARFARIN SODIUM 1 MG PO TABS
1.0000 mg | ORAL_TABLET | Freq: Once | ORAL | Status: DC
  Filled 2013-11-11: qty 1

## 2013-11-11 MED ORDER — LEVOFLOXACIN 500 MG PO TABS: 500.0000 mg | ORAL_TABLET | ORAL | Status: DC

## 2013-11-11 MED ORDER — AMLODIPINE BESYLATE 10 MG PO TABS: 10.0000 mg | ORAL_TABLET | Freq: Every day | ORAL | Status: DC

## 2013-11-11 MED ORDER — IPRATROPIUM-ALBUTEROL 0.5-2.5 (3) MG/3ML IN SOLN: 3.0000 mL | Freq: Three times a day (TID) | RESPIRATORY_TRACT | Status: DC

## 2013-11-11 MED ORDER — PREDNISONE 50 MG PO TABS
60.0000 mg | ORAL_TABLET | Freq: Once | ORAL | Status: AC
  Administered 2013-11-11: 60 mg via ORAL
  Filled 2013-11-11: qty 1

## 2013-11-11 MED ORDER — PANTOPRAZOLE SODIUM 40 MG PO TBEC: 40.0000 mg | DELAYED_RELEASE_TABLET | Freq: Every day | ORAL | Status: DC

## 2013-11-11 MED ORDER — MOMETASONE FURO-FORMOTEROL FUM 100-5 MCG/ACT IN AERO: 2.0000 | INHALATION_SPRAY | Freq: Two times a day (BID) | RESPIRATORY_TRACT | Status: DC

## 2013-11-11 MED ORDER — BISOPROLOL FUMARATE 5 MG PO TABS: 5.0000 mg | ORAL_TABLET | Freq: Two times a day (BID) | ORAL | Status: DC

## 2013-11-11 MED ORDER — SENNOSIDES-DOCUSATE SODIUM 8.6-50 MG PO TABS: 2.0000 | ORAL_TABLET | Freq: Every day | ORAL | Status: DC

## 2013-11-11 MED ORDER — HYDRALAZINE HCL 25 MG PO TABS: 25.0000 mg | ORAL_TABLET | Freq: Three times a day (TID) | ORAL | Status: DC

## 2013-11-11 MED ORDER — POLYETHYLENE GLYCOL 3350 17 G PO PACK
17.0000 g | PACK | Freq: Every day | ORAL | Status: DC | PRN
  Filled 2013-11-11: qty 1

## 2013-11-11 MED ORDER — PREDNISONE 10 MG PO TABS: ORAL_TABLET | ORAL | Status: DC

## 2013-11-11 MED ORDER — GUAIFENESIN-CODEINE 100-10 MG/5ML PO SOLN: 10.0000 mL | Freq: Four times a day (QID) | ORAL | Status: DC | PRN

## 2013-11-11 MED ORDER — ALBUTEROL SULFATE HFA 108 (90 BASE) MCG/ACT IN AERS
2.0000 | INHALATION_SPRAY | Freq: Four times a day (QID) | RESPIRATORY_TRACT | Status: DC | PRN
Start: 2013-11-11 — End: 2013-11-16

## 2013-11-11 MED ORDER — ISOSORBIDE MONONITRATE ER 60 MG PO TB24: 60.0000 mg | ORAL_TABLET | Freq: Every day | ORAL | Status: DC

## 2013-11-11 NOTE — Progress Notes (Signed)
PT Cancellation Note  Patient Details Name: [name] MRN: [id number] DOB: 1941-09-02   Cancelled Treatment:    Reason Eval/Treat Not Completed: Other (comment) (Pt dressed and ready to go home.  Refused PT.)   INGOLD,Darrow Barreiro 11/11/2013, 2:10 PM [facility] Acute Rehabilitation [phone] (pager)

## 2013-11-11 NOTE — Progress Notes (Signed)
ANTICOAGULATION CONSULT NOTE - Follow Up Consult  Pharmacy Consult for Coumadin  Indication: afib, AVR  Allergies  Allergen Reactions  . Adhesive [Tape] Itching, Rash and Other (See Comments)    Redness and skin breaks down  . Latex Rash  . Neomycin-Bacitracin Zn-Polymyx Rash   Patient Measurements: Height: 5' (152.4 cm) (scale C) Weight: 185 lb 6.5 oz (84.1 kg) (Scale C) IBW/kg (Calculated) : 45.5  Vital Signs: Temp: 98.5 F (36.9 C) (02/13 0438) Temp src: Oral (02/13 0438) BP: 152/88 mmHg (02/13 0946) Pulse Rate: 80 (02/13 0438)  Labs:  Recent Labs  11/09/13 0215 11/10/13 0645 11/11/13 0523  HGB 14.7  --   --   HCT 43.5  --   --   PLT 242  --   --   LABPROT 32.5* 34.8* 31.8*  INR 3.32* 3.63* 3.23*  CREATININE 1.52*  --   --     Estimated Creatinine Clearance: 32.6 ml/min (by C-G formula based on Cr of 1.52).  Assessment: 72yof continues on Coumadin for afib, AVR. INR now just slightly above goal range and trending back down.  Levaquin added which is likely contributing to increased INR. No bleeding reported, CBC stable. HOME Dose: Warfarin 5mg  every Tues/Thurs. And 2.5mg  all other days.  Goal of Therapy:  INR 2-3 Monitor platelets by anticoagulation protocol: Yes   Plan:  1) Coumadin 1mg  tonight 2) INR in AM  Nadara Mustard, PharmD., MS Clinical Pharmacist Pager:  [phone] Thank you for allowing pharmacy to be part of this patients care team. 11/11/2013 11:53 AM

## 2013-11-11 NOTE — Discharge Summary (Signed)
Physician Discharge Summary     Patient ID: [name] MRN: [id number] DOB/AGE: [birth year] 73 y.o.  Cardiologist: Patty Sermons PCP: Lemont Fillers., NP  Admit date: 11/04/2013 Discharge date: 11/11/2013  Admission Diagnoses:Acute on chronic systolic CHF (congestive heart failure), Acute respiratory failure, Acute bronchitis, hypertensive urgency.  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 [name]. 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 She also has a dilated left ventricle with marked decrease in left ventricular systolic function. She is now on carvedilol 6.25 mg by mouth twice a day. Her symptoms of congestive heart failure have improved significantly since surgery. She is no longer having any ankle edema or significant orthopnea. She now [place] and her daughter was recently diagnosed with influenza. She has a history of chronic atrial fibrillation and is now on Coumadin. She was in her USOH until about 3 days ago when she started having subjective fevers, chills and cough that was productive but she could not cough it up to see the color. She then started developing SOB. She presented to [facility] where a chest xray showed bilateral interstitial infiltrates felt secondary to pulmonary edema or an infectious or inflammatory etiology with  no focal regions consolidation. Her BNP was elevated at 3000. She was now transferred to [facility] for further care. She received albuterol nebs In the ER.    She was admitted with acute respiratory failure secondary to acute on chronic sys HF and acute bronchitis.  She was started on Lasix 80mg  IV bid for which she responded well with net fluid loss of 3L.  IV NTG was used for acute BP management.  Cardizem was not used for due to severe LV dysfunction.  Beta blocker held for acute wheezing.  EF is 45-50% by echo with normally functioning bioprosthetic valve.(See below).  IM was consulted.  She was thought to have an acute viral syndrome and was started on tamiflu.  Influenza negative.  Respiratory meds added: xopenex, albuteraol, dulera.  Steroids added and will be tapered off after discharge.  No ACE or ARB due to CKD.   Amlodipine increased to10mg , Imdur added, Bisoprolol.   Hydralazine was added later and BP improved.  The patient progressed well.  Ambulating in the hall.  The patient was seen by Dr. Allyson Sabal who felt she was stable for DC home.  FU arranged.     Consults: Internal Medicine  Significant Diagnostic Studies:  Study Conclusions  - Left ventricle: The cavity size was normal. There was moderate concentric hypertrophy. Systolic function was mildly reduced. The estimated ejection fraction was in the range of 45% to 50%. Wall motion was normal; there were no regional wall motion abnormalities. The study is not technically sufficient to allow evaluation of LV diastolic function. - Aortic valve: A bioprosthetic valve sits well in the aortic position. The leaflets open well. There is no central aortic insufficiency or  paravalvular leak. The mean transaortic gradient is15mmHg and the peak gradient is 21 mmHg and are normal for this type of prosthesis. - Aorta: Normal size ascending aorta measiring 34 mm. Aortic root is mildly dilated and measures 43 mm. - Mitral valve: No regurgitation. -  Left atrium: The atrium was mildly dilated. - Right ventricle: The cavity size was mildly dilated. Wall thickness was normal. Systolic function was mildly reduced. - Tricuspid valve: Trivial regurgitation. - Pulmonic valve: No regurgitation. - Pericardium, extracardiac: A trivial pericardial effusion was identified. There was a left pleural effusion.  BMET    Component Value Date/Time   NA 143 11/11/2013 1105   K 4.1 11/11/2013 1105   CL 99 11/11/2013 1105   CO2 27 11/11/2013 1105   GLUCOSE 187* 11/11/2013 1105   BUN 55* 11/11/2013 1105   CREATININE 1.54* 11/11/2013 1105   CREATININE 1.54* 09/14/2013 1412   CALCIUM 8.8 11/11/2013 1105   GFRNONAA 33* 11/11/2013 1105   GFRAA 38* 11/11/2013 1105    Treatments: See above  Discharge Exam: Blood pressure 152/88, pulse 80, temperature 98.5 F (36.9 C), temperature source Oral, resp. rate 18, height 5' (1.524 m), weight 185 lb 6.5 oz (84.1 kg), SpO2 99.00%.   Disposition: 01-Home or Self Care  Discharge Orders   Future Appointments Provider Department Dept Phone   11/14/2013 1:15 PM [facility] Office [phone]   11/21/2013 11:30 AM Beatrice Lecher, PA-C [facility] Office [phone]   11/21/2013 4:15 PM Lupita Leash, MD [facility] [phone]   12/12/2013 11:45 AM Duke Salvia, MD [facility] Office [phone]   12/13/2013 1:15 PM Sandford Craze, NP [facility] [phone]   Future Orders Complete By Expires   Diet - low sodium heart healthy  As directed    Discharge instructions  As directed    Comments:     Monitor weight daily.  If you gain 2 pounds in 24 hours, or 5 pounds in a weeks time, call the office.   Increase activity slowly  As directed        Medication List    STOP taking these medications       carvedilol 12.5 MG tablet  Commonly known as:  COREG      TAKE these medications       albuterol 108  (90 BASE) MCG/ACT inhaler  Commonly known as:  PROVENTIL HFA;VENTOLIN HFA  Inhale 2 puffs into the lungs every 6 (six) hours as needed for wheezing or shortness of breath.     allopurinol 100 MG tablet  Commonly known as:  ZYLOPRIM  Take 100 mg by mouth 2 (two) times daily.     amLODipine 10 MG tablet  Commonly known as:  NORVASC  Take 1 tablet (10 mg total) by mouth daily.     Biotin 5000 MCG Caps  Take 1 capsule (5,000 mcg total) by mouth daily.     bisoprolol 5 MG tablet  Commonly known as:  ZEBETA  Take 1 tablet (5 mg total) by mouth 2 (two) times daily.     Calcium-Vitamin D 600-400 MG-UNIT Tabs  Take 1 tablet by mouth daily.     colchicine 0.6 MG tablet  Take 0.6-1.2 mg by mouth daily as needed (for gout flareups). Takes 2 tabs as needed, then takes 1 addt'l tablet about 1 hour later     DULCOLAX PO  Take 1 tablet by mouth daily as  needed (for constipation).     furosemide 40 MG tablet  Commonly known as:  LASIX  Take 40-80 mg by mouth 2 (two) times daily. Takes 80mg  in morning and 40mg  in evening     guaiFENesin-codeine 100-10 MG/5ML syrup  Take 10 mLs by mouth every 6 (six) hours as needed for cough.     hydrALAZINE 25 MG tablet  Commonly known as:  APRESOLINE  Take 1 tablet (25 mg total) by mouth every 8 (eight) hours.     ipratropium-albuterol 0.5-2.5 (3) MG/3ML Soln  Commonly known as:  DUONEB  Take 3 mLs by nebulization 3 (three) times daily.     isosorbide mononitrate 60 MG 24 hr tablet  Commonly known as:  IMDUR  Take 1 tablet (60 mg total) by mouth daily.     levofloxacin 500 MG tablet  Commonly known as:  LEVAQUIN  Take 1 tablet (500 mg total) by mouth every other day. X 5 days     mometasone-formoterol 100-5 MCG/ACT Aero  Commonly known as:  DULERA  Inhale 2 puffs into the lungs 2 (two) times daily.     NITROSTAT 0.4 MG SL tablet  Generic drug:  nitroGLYCERIN  Place 0.4 mg under the tongue every 5 (five) minutes as needed. For chest pain      pantoprazole 40 MG tablet  Commonly known as:  PROTONIX  Take 1 tablet (40 mg total) by mouth daily.     potassium chloride SA 20 MEQ tablet  Commonly known as:  K-DUR,KLOR-CON  Take 20 mEq by mouth 2 (two) times daily.     predniSONE 10 MG tablet  Commonly known as:  DELTASONE  - Prednisone dosing: Take  Prednisone 40mg  (4 tabs) x 3 days, then taper to 30mg  (3 tabs) x 3 days, then 20mg  (2 tabs) x 3days, then 10mg  (1 tab) x 3days, then OFF.  -   - Dispense:  30 tabs, refills: None  Start taking on:  11/12/2013     rosuvastatin 5 MG tablet  Commonly known as:  CRESTOR  Take 5 mg by mouth daily.     senna-docusate 8.6-50 MG per tablet  Commonly known as:  SENOKOT S  Take 2 tablets by mouth at bedtime. For constipation, also available OTC     warfarin 5 MG tablet  Commonly known as:  COUMADIN  - Take 2.5-5 mg by mouth daily at 6 PM. Takes 5mg  on Tuesday and Thursday   - Takes 2.5mg  all other days           Follow-up Information   Follow up with Moss Mcennis Mcquaid, MD  On 11/21/2013. (AT 4:15PM , for hospital follow-up. )    Contact information:   [facility] [address]  [place] [id number] [address]nd [address]       Follow up with Tereso NewcomerScott Weaver, PA-C On 11/21/2013. (11:30 AM)    Specialty:  Physician Assistant   Contact information:   [address] [place] [address] [phone]       Signed: Wilburt FinlayHAGER, Daimon Kean 11/11/2013, 12:57 PM

## 2013-11-11 NOTE — Progress Notes (Signed)
Subjective: Feeling a lot better.  Had BM this morning.  Objective: Vital signs in last 24 hours: Temp:  [97.2 F (36.2 C)-98.5 F (36.9 C)] 98.5 F (36.9 C) (02/13 0438) Pulse Rate:  [59-80] 80 (02/13 0438) Resp:  [18-20] 18 (02/13 0438) BP: (110-152)/(74-111) 152/88 mmHg (02/13 0946) SpO2:  [95 %-99 %] 99 % (02/13 0923) Weight:  [185 lb 6.5 oz (84.1 kg)] 185 lb 6.5 oz (84.1 kg) (02/13 0438) Last BM Date: 11/07/13  Intake/Output from previous day: 02/12 0701 - 02/13 0700 In: 240 [P.O.:240] Out: -  Intake/Output this shift: Total I/O In: 240 [P.O.:240] Out: -   Medications Current Facility-Administered Medications  Medication Dose Route Frequency Provider Last Rate Last Dose  . 0.9 %  sodium chloride infusion  250 mL Intravenous PRN Quintella Reichertraci R Turner, MD   250 mL at 11/06/13 1706  . acetaminophen (TYLENOL) tablet 650 mg  650 mg Oral Q4H PRN Quintella Reichertraci R Turner, MD      . albuterol (PROVENTIL) (2.5 MG/3ML) 0.083% nebulizer solution 2.5 mg  2.5 mg Nebulization Q2H PRN Cristal FordSrikar A Reddy, MD   2.5 mg at 11/07/13 2223  . allopurinol (ZYLOPRIM) tablet 100 mg  100 mg Oral BID Quintella Reichertraci R Turner, MD   100 mg at 11/11/13 0945  . amLODipine (NORVASC) tablet 10 mg  10 mg Oral Daily Tonny BollmanMichael Cooper, MD   10 mg at 11/11/13 0946  . atorvastatin (LIPITOR) tablet 10 mg  10 mg Oral q1800 Quintella Reichertraci R Turner, MD   10 mg at 11/10/13 1716  . bisacodyl (DULCOLAX) suppository 10 mg  10 mg Rectal Daily PRN Ripudeep K Rai, MD      . bisoprolol (ZEBETA) tablet 5 mg  5 mg Oral BID Laurey Moralealton S McLean, MD   5 mg at 11/11/13 0945  . furosemide (LASIX) tablet 40 mg  40 mg Oral BID Runell GessJonathan J Recie Cirrincione, MD   40 mg at 11/11/13 0946  . guaiFENesin-codeine 100-10 MG/5ML solution 10 mL  10 mL Oral Q6H PRN Ripudeep Jenna LuoK Rai, MD   10 mL at 11/09/13 2144  . hydrALAZINE (APRESOLINE) injection 10 mg  10 mg Intravenous Q4H PRN Tonny BollmanMichael Cooper, MD   10 mg at 11/08/13 0746  . hydrALAZINE (APRESOLINE) tablet 25 mg  25 mg Oral 3 times per day  Tonny BollmanMichael Cooper, MD   25 mg at 11/11/13 [phone]  . insulin aspart (novoLOG) injection 0-9 Units  0-9 Units Subcutaneous TID WC Alba CoryBelkys A Regalado, MD   1 Units at 11/11/13 [phone]  . levalbuterol (XOPENEX) nebulizer solution 0.63 mg  0.63 mg Nebulization TID Cassell Clementhomas Brackbill, MD   0.63 mg at 11/11/13 [id number]   And  . ipratropium (ATROVENT) nebulizer solution 0.5 mg  0.5 mg Nebulization TID Cassell Clementhomas Brackbill, MD   0.5 mg at 11/11/13 [phone]  . isosorbide mononitrate (IMDUR) 24 hr tablet 60 mg  60 mg Oral Daily Tonny BollmanMichael Cooper, MD   60 mg at 11/11/13 0946  . levofloxacin (LEVAQUIN) tablet 500 mg  500 mg Oral Q48H Ripudeep K Rai, MD   500 mg at 11/11/13 0946  . mometasone-formoterol (DULERA) 100-5 MCG/ACT inhaler 2 puff  2 puff Inhalation BID Ripudeep Jenna LuoK Rai, MD   2 puff at 11/11/13 0921  . ondansetron (ZOFRAN) injection 4 mg  4 mg Intravenous Q6H PRN Quintella Reichertraci R Turner, MD      . pantoprazole (PROTONIX) EC tablet 40 mg  40 mg Oral Q0600 Ripudeep Jenna LuoK Rai, MD   40 mg at 11/11/13 [id number]  .  polyethylene glycol (MIRALAX / GLYCOLAX) packet 17 g  17 g Oral Daily PRN Ripudeep K Rai, MD      . predniSONE (DELTASONE) tablet 60 mg  60 mg Oral Once Ripudeep K Rai, MD      . sodium chloride 0.9 % injection 3 mL  3 mL Intravenous Q12H Quintella Reichert, MD   3 mL at 11/11/13 0949  . sodium chloride 0.9 % injection 3 mL  3 mL Intravenous PRN Quintella Reichert, MD      . sodium phosphate (FLEET) 7-19 GM/118ML enema 1 enema  1 enema Rectal Once Belkys A Regalado, MD      . Warfarin - Pharmacist Dosing Inpatient   Does not apply q1800 Abran Duke, [facility]        PE: General appearance: alert, cooperative and no distress Lungs: Bilateral wheeze. Heart: irregularly irregular rhythm and 1/6 sys MM axillae. Abdomen: +bs. soft mildly tender Extremities: No LEE Pulses: 2+ and symmetric Skin: Warm and dry Neurologic: Grossly normal  Lab Results:   Recent Labs  11/09/13 0215  WBC 6.7  HGB 14.7  HCT 43.5  PLT 242   BMET  Recent Labs   11/09/13 0215  NA 139  K 5.1  CL 100  CO2 22  GLUCOSE 125*  BUN 56*  CREATININE 1.52*  CALCIUM 8.8   PT/INR  Recent Labs  11/09/13 0215 11/10/13 0645 11/11/13 0523  LABPROT 32.5* 34.8* 31.8*  INR 3.32* 3.63* 3.23*    Assessment/Plan  Principal Problem:   Acute on chronic systolic CHF (congestive heart failure) Active Problems:   HYPERLIPIDEMIA   HYPERTENSION   Implantable cardiac defibrillator single-chamber-Medtronic   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  Appears euvolemic.  AFIB with CVR. On coumadin.  INR 3.23.  Meds: norvasc 10, lipitor, bisoprolol, lasix 40 PO  Bid, hydralazine 25 tid, imdur 30.  Rechecking BMET this morning.  Acute bronchitis: levaquin, xopenex, dulera.   FU with Dr. Sherene Sires as arranged by Dr. Isidoro Donning.   Ambulate in hall and DC today.    LOS: 7 days    [name], [name] 11/11/2013 10:43 AM   Agree with note written by [facility]  OK for D/C. Good diuresis. AFIB with CVR on therapeutic coumadin AC. Exam benign. Appears euvolemic. ROV with Dr. Estill Batten 11/11/2013 11:38 AM

## 2013-11-11 NOTE — Progress Notes (Signed)
Occupational Therapy Treatment Patient Details Name: [name] MRN: [id number] DOB: [birth year] Today's Date: 11/11/2013 Time: 0312-8118 OT Time Calculation (min): 30 min  OT Assessment / Plan / Recommendation  History of present illness Pt admit with CHF.   OT comments  Pt. Was cooperative during tx. Pt. Has all needed DME and AE and is I with use.   Follow Up Recommendations       Barriers to Discharge       Equipment Recommendations       Recommendations for Other Services    Frequency     Progress towards OT Goals    Plan      Precautions / Restrictions Precautions Precautions: Fall Restrictions Weight Bearing Restrictions: No   Pertinent Vitals/Pain No c/o    ADL  Grooming: Wash/dry hands;Wash/dry face;Set up Where Assessed - Grooming:  (standing at sink) Toilet Transfer: Modified independent Toilet Transfer Method: Stand pivot Acupuncturist: Comfort height toilet;Grab bars Toileting - Architect and Hygiene: Modified independent;Performed Where Assessed - Engineer, mining and Hygiene: Sit to stand from 3-in-1 or toilet Tub/Shower Transfer: Simulated;Modified independent Tub/Shower Transfer Method: Stand pivot Psychologist, educational: Grab bars    OT Diagnosis:    OT Problem List:   OT Treatment Interventions:     OT Goals(current goals can now be found in the care plan section) Acute Rehab OT Goals Patient Stated Goal: to go home OT Goal Formulation: With patient  Visit Information  Last OT Received On: 11/11/13 Assistance Needed: +1 History of Present Illness: Pt admit with CHF.    Subjective Data      Prior Functioning       Cognition  Cognition Arousal/Alertness: Awake/alert Behavior During Therapy: WFL for tasks assessed/performed Overall Cognitive Status: Within Functional Limits for tasks assessed    Mobility  Bed Mobility Overal bed mobility: Modified Independent    Exercises      Balance    End of Session OT - End of Session Activity Tolerance: Patient limited by fatigue Patient left: in chair;with call bell/phone within reach  GO     [name] 11/11/2013, 9:06 AM

## 2013-11-11 NOTE — Progress Notes (Signed)
All d/c instructions explained as given to pt and granddaughter.  Verbalized understanding.  Amanda Pea, Charity fundraiser.

## 2013-11-11 NOTE — Care Management Note (Addendum)
  Page 2 of 2   11/11/2013     2:29:34 PM   CARE MANAGEMENT NOTE 11/11/2013  Patient:  [name], [name]   Account Number:  [id number]  Date Initiated:  11/08/2013  Documentation initiated by:  [facility]  Subjective/Objective Assessment:   CHF, acute resp failure     Action/Plan:   lives at home with dtr, Sonny Dandy # [phone]   Anticipated DC Date:     Anticipated DC Plan:  HOME W HOME HEALTH SERVICES      DC Planning Services  CM consult      [facility] Choice  HOME HEALTH   Choice offered to / List presented to:  C-1 Patient        HH arranged  HH-1 RN  HH-2 PT      [facility] agency  [facility]   Status of service:  Completed, signed off Medicare Important Message given?   (If response is "NO", the following Medicare IM given date fields will be blank) Date Medicare IM given:   Date Additional Medicare IM given:    Discharge Disposition:  HOME W HOME HEALTH SERVICES  Per UR Regulation:  Reviewed for med. necessity/level of care/duration of stay  If discussed at Long Length of Stay Meetings, dates discussed:    Comments:  11/10/2013 PCG: DTR/Kay Nigel Sloop [phone] Dispositon:  Home with HHS:  RN, PT, Nebulizer to room prior to d/c.  [facility] notified) Ranveer Wahlstrom RN, BSN, MSHL, CCM 11/10/2013   11/08/2013 1130 NCM spoke to pt and offered choice for [facility]. Requested [facility] for [facility]. Pt states she has RW, wheelchair, and cane. Lives at home with dtr and was independent at home. PT recommended HH PT and pt may benefit from [facility] RN. Waiting final recommendations from attending MD. Isidoro Donning RN CCM Case Mgmt phone [phone]

## 2013-11-11 NOTE — Progress Notes (Signed)
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  - influenza negative, no fevers, tamiflu discontinued, CXR 2/8 negative for infiltrates, no pulmonary edema.  - cont scheduled nebs with Xopenex and Atrovent while in-patient, left her prescriptions   - transitioned to oral prednisone, will need prednisone taper - left her presciption - Continue dulera, flutter valve and levaquin - on PPI d/t high dose steroids - will need DME nebs machine, will check Home O2 eval - I made her appointment with Dr Sherene SiresWert for follow-up on 11/21/13 at 9:30am, (on AVS)  Acute on chronic systolic congestive heart failure  Management as per cardiology.  Uncontrolled hypertension  Management as per cardiology.   Chronic kidney disease stage III Cr baseline 1.4 to 1.5.  Worsening renal function, suspect secondary to diuresis.   Hyperlipidemia  Stable .  Atrial fibrillation on chronic anticoagulation  Rate controlled. Continue Coumadin.   Hyperglycemia; secondary to steroids.   Constipation; resolved On miralax  DVT Prophylaxis:  Code Status: Full code  Family Communication: Discussed in detail with the patient  Disposition:  I have left the prescriptions for Duo-nebs soln, albuterol inhaler, levaquin, prednisone taper, tussinex, PPI. Explained everything in detail to patient's grand daughter and patient herself.  Made her appt with Dr  Sherene SiresWert on 11/21/13 at 9:30AM for pulm f/u.  She is stable from my stand-point. I will sign off. Please call me if you have any qns.           Subjective:  patient seen and examined, feeling significantly better from yesterday, wheezing improving  Objective: Weight change: -0.45 kg (-15.9 oz) No intake or output data in the 24 hours ending 11/11/13 0919 Blood pressure 118/86, pulse 80, temperature 98.5 F (36.9 C), temperature source Oral, resp. rate 18, height 5' (1.524 m), weight 84.1 kg (185 lb 6.5 oz), SpO2 99.00%.  Physical Exam: General: Alert and awake, oriented x3 CVS: S1-S2 clear, no murmur rubs or gallops Chest: mild scattered wheezing significantly improving  Abdomen: soft nontender, nondistended, normal bowel sounds  Extremities: no cyanosis, clubbing, no edema noted bilaterally   Lab Results: Basic Metabolic Panel:  Recent Labs Lab 11/05/13 0027  11/08/13 0930 11/09/13 0215  NA 141  < > 143 139  K 3.9  < > 4.0 5.1  CL 96  < > 103 100  CO2 27  < > 23 22  GLUCOSE 95  < > 177* 125*  BUN 21  < > 49* 56*  CREATININE 1.35*  < > 1.22* 1.52*  CALCIUM 8.7  < > 8.8 8.8  MG 2.2  --   --   --   < > = values in this interval not displayed. Liver Function Tests:  Recent Labs Lab 11/04/13 1824 11/05/13 0027  AST 24 22  ALT 12 12  ALKPHOS 86 84  BILITOT 0.5 0.6  PROT 8.1 8.2  ALBUMIN 4.0 3.7   No results found for  this basename: LIPASE, AMYLASE,  in the last 168 hours No results found for this basename: AMMONIA,  in the last 168 hours CBC:  Recent Labs Lab 11/05/13 0027  11/07/13 0458 11/09/13 0215  WBC 8.1  < > 13.2* 6.7  NEUTROABS 4.6  --   --   --   HGB 15.0  < > 14.9 14.7  HCT 44.3  < > 43.4 43.5  MCV 95.1  < > 92.9 92.9  PLT 161  < > 227 242  < > = values in this interval not displayed. Cardiac Enzymes:  Recent Labs Lab 11/05/13 0045 11/05/13 0458 11/05/13 1015  TROPONINI <0.30 <0.30 <0.30   BNP: No components found with this basename:  POCBNP,  CBG:  Recent Labs Lab 11/10/13 0627 11/10/13 1443 11/10/13 1637 11/10/13 2107 11/11/13 0623  GLUCAP 136* 181* 155* 159* 132*     Micro Results: Recent Results (from the past 240 hour(s))  MRSA PCR SCREENING     Status: None   Collection Time    11/04/13  9:29 PM      Result Value Ref Range Status   MRSA by PCR NEGATIVE  NEGATIVE Final   Comment:            The GeneXpert MRSA Assay (FDA     approved for NASAL specimens     only), is one component of a     comprehensive MRSA colonization     surveillance program. It is not     intended to diagnose MRSA     infection nor to guide or     monitor treatment for     MRSA infections.    Studies/Results: Dg Chest 2 View  11/06/2013   CLINICAL DATA:  Follow up edema.  EXAM: CHEST  2 VIEW  COMPARISON:  11/04/2013  FINDINGS: Heart size is mildly enlarged but unchanged. Stable position of the left cardiac ICD. Stable postsurgical changes in the chest. There is no significant pulmonary edema or focal airspace disease. Negative for a pneumothorax. No significant pleural effusion.  IMPRESSION: No significant pulmonary edema and no focal chest disease.   Electronically Signed   By: Richarda Overlie M.D.   On: 11/06/2013 07:47   Dg Chest 2 View  11/04/2013   CLINICAL DATA:  Shortness of breath  EXAM: CHEST  2 VIEW  COMPARISON:  DG CHEST 2 VIEW dated 08/08/2012  FINDINGS: Low lung volumes. Patient is status post median sternotomy. A left chest wall AICD single chamber is appreciated. Lead tips project in the region of the right ventricle. A prosthetic aortic valve is identified. There is prominence of the interstitial markings, and a component of peribronchial cuffing. Increased bilateral perihilar densities appreciated. No focal regions of consolidation appreciated.  IMPRESSION: Bilateral interstitial infiltrates is may reflect sequela pulmonary edema. An infectious or inflammatory etiology is also a diagnostic consideration clinically  appropriate. No focal regions consolidation appreciated.   Electronically Signed   By: Salome Holmes M.D.   On: 11/04/2013 17:52    Medications: Scheduled Meds: . allopurinol  100 mg Oral BID  . amLODipine  10 mg Oral Daily  . atorvastatin  10 mg Oral q1800  . bisoprolol  5 mg Oral BID  . furosemide  40 mg Oral BID  . hydrALAZINE  25 mg Oral 3 times per day  . insulin aspart  0-9 Units Subcutaneous TID WC  . levalbuterol  0.63 mg Nebulization TID   And  . ipratropium  0.5 mg Nebulization TID  .  isosorbide mononitrate  60 mg Oral Daily  . levofloxacin  500 mg Oral Q48H  . mometasone-formoterol  2 puff Inhalation BID  . pantoprazole  40 mg Oral Q0600  . polyethylene glycol  17 g Oral BID  . predniSONE  60 mg Oral Once  . sodium chloride  3 mL Intravenous Q12H  . sodium phosphate  1 enema Rectal Once  . Warfarin - Pharmacist Dosing Inpatient   Does not apply q1800      LOS: 7 days   Denean Pavon M.D. [facility] 11/11/2013, 9:19 AM Pager: [phone]  If 7PM-7AM, please contact night-coverage [email or web address] Password [email or web address]

## 2013-11-14 ENCOUNTER — Ambulatory Visit (INDEPENDENT_AMBULATORY_CARE_PROVIDER_SITE_OTHER): Payer: Medicare Other

## 2013-11-14 DIAGNOSIS — I4891 Unspecified atrial fibrillation: Secondary | ICD-10-CM

## 2013-11-14 DIAGNOSIS — Z954 Presence of other heart-valve replacement: Secondary | ICD-10-CM

## 2013-11-14 DIAGNOSIS — I352 Nonrheumatic aortic (valve) stenosis with insufficiency: Secondary | ICD-10-CM

## 2013-11-14 DIAGNOSIS — I359 Nonrheumatic aortic valve disorder, unspecified: Secondary | ICD-10-CM

## 2013-11-14 LAB — POCT INR: INR: 4.3

## 2013-11-16 ENCOUNTER — Telehealth: Payer: Self-pay | Admitting: Cardiology

## 2013-11-16 MED ORDER — ALBUTEROL SULFATE HFA 108 (90 BASE) MCG/ACT IN AERS: 2.0000 | INHALATION_SPRAY | Freq: Four times a day (QID) | RESPIRATORY_TRACT | Status: DC | PRN

## 2013-11-16 NOTE — Telephone Encounter (Signed)
Spoke with the pharmacy and patient needs to bring in her Medicare part B card and gave diagnosis to pharmacist. Advised daughter

## 2013-11-16 NOTE — Telephone Encounter (Signed)
New message     Pt cannot afford albuterol  (300.00)  Is there anything else she can get?

## 2013-11-17 ENCOUNTER — Telehealth: Payer: Self-pay | Admitting: Cardiology

## 2013-11-17 NOTE — Telephone Encounter (Signed)
Since she is feeling better today she should keep her her medication as it presently is.  If her weight starts to go back up higher, we will have her increase her Lasix to 80 twice a day.

## 2013-11-17 NOTE — Telephone Encounter (Signed)
Spoke with patient and she does not think she can come into the office today and tomorrow secondary to transportation issues. Patient states she is feeling better today than yesterday. Is taking her lasix 80 mg every morning and 40 mg in the evening. When asked patient if she weighs herself daily, states she does but only able to give one weight from a few days ago, 174 she thought. Will forward to  Dr. Patty Sermons for review

## 2013-11-17 NOTE — Telephone Encounter (Signed)
New problem   Pt has had weight gain 8 lbs. tues 171lbs today 178lbs...more sob than normal..oxy dropped 89% w/excertion, but went back to 90 w/rest break. Please advise

## 2013-11-17 NOTE — Telephone Encounter (Signed)
Advised patient and will call and check on her tomorrow

## 2013-11-18 ENCOUNTER — Telehealth: Payer: Self-pay | Admitting: *Deleted

## 2013-11-18 ENCOUNTER — Telehealth: Payer: Self-pay | Admitting: Cardiology

## 2013-11-18 DIAGNOSIS — J9600 Acute respiratory failure, unspecified whether with hypoxia or hypercapnia: Secondary | ICD-10-CM

## 2013-11-18 DIAGNOSIS — I509 Heart failure, unspecified: Secondary | ICD-10-CM

## 2013-11-18 MED ORDER — ALBUTEROL SULFATE (2.5 MG/3ML) 0.083% IN NEBU: INHALATION_SOLUTION | RESPIRATORY_TRACT | Status: DC

## 2013-11-18 MED ORDER — IPRATROPIUM BROMIDE 0.02 % IN SOLN: RESPIRATORY_TRACT | Status: DC

## 2013-11-18 NOTE — Telephone Encounter (Signed)
Spoke with patients daughter and she is not feeling as good as she did when she first came home from the hospital. No increase in weight just. Will keep ov with Lorin Picket Monday and advised to take her to ED if worse over the weekend. Daughter verbalized understanding.

## 2013-11-18 NOTE — Telephone Encounter (Signed)
Trying to get patients albuterol nebulizer medication worked out for insurance to cover.

## 2013-11-18 NOTE — Telephone Encounter (Signed)
New message      Pt has not picked up the proair.

## 2013-11-18 NOTE — Telephone Encounter (Signed)
Please call patients daughter at [phone], she states that the pharmacy never received the rx with the diagnosis code for the albuterol. Thanks, MI

## 2013-11-18 NOTE — Telephone Encounter (Signed)
Trying to get patients albuterol nebulizer medication worked out for insurance to cover.  Duoneb not covered by insurance, ok to change to Abluterol alternating with ipratropium per  Dr. Patty Sermons

## 2013-11-21 ENCOUNTER — Encounter: Payer: Self-pay | Admitting: Pulmonary Disease

## 2013-11-21 ENCOUNTER — Ambulatory Visit (INDEPENDENT_AMBULATORY_CARE_PROVIDER_SITE_OTHER): Payer: Medicare Other | Admitting: *Deleted

## 2013-11-21 ENCOUNTER — Ambulatory Visit (INDEPENDENT_AMBULATORY_CARE_PROVIDER_SITE_OTHER): Payer: Medicare Other | Admitting: Pulmonary Disease

## 2013-11-21 ENCOUNTER — Ambulatory Visit (INDEPENDENT_AMBULATORY_CARE_PROVIDER_SITE_OTHER): Payer: Medicare Other | Admitting: Physician Assistant

## 2013-11-21 ENCOUNTER — Inpatient Hospital Stay: Payer: Medicare Other | Admitting: Internal Medicine

## 2013-11-21 ENCOUNTER — Encounter: Payer: Self-pay | Admitting: Physician Assistant

## 2013-11-21 VITALS — BP 112/70 | HR 72 | Ht 60.0 in | Wt 185.0 lb

## 2013-11-21 VITALS — BP 110/70 | HR 71 | Ht 60.0 in | Wt 184.0 lb

## 2013-11-21 DIAGNOSIS — Z954 Presence of other heart-valve replacement: Secondary | ICD-10-CM

## 2013-11-21 DIAGNOSIS — I4891 Unspecified atrial fibrillation: Secondary | ICD-10-CM

## 2013-11-21 DIAGNOSIS — R0602 Shortness of breath: Secondary | ICD-10-CM

## 2013-11-21 DIAGNOSIS — I359 Nonrheumatic aortic valve disorder, unspecified: Secondary | ICD-10-CM

## 2013-11-21 DIAGNOSIS — I5023 Acute on chronic systolic (congestive) heart failure: Secondary | ICD-10-CM

## 2013-11-21 DIAGNOSIS — I352 Nonrheumatic aortic (valve) stenosis with insufficiency: Secondary | ICD-10-CM

## 2013-11-21 DIAGNOSIS — N183 Chronic kidney disease, stage 3 unspecified: Secondary | ICD-10-CM

## 2013-11-21 DIAGNOSIS — I10 Essential (primary) hypertension: Secondary | ICD-10-CM

## 2013-11-21 DIAGNOSIS — J209 Acute bronchitis, unspecified: Secondary | ICD-10-CM

## 2013-11-21 DIAGNOSIS — I509 Heart failure, unspecified: Secondary | ICD-10-CM

## 2013-11-21 DIAGNOSIS — J449 Chronic obstructive pulmonary disease, unspecified: Secondary | ICD-10-CM

## 2013-11-21 DIAGNOSIS — Z5181 Encounter for therapeutic drug level monitoring: Secondary | ICD-10-CM

## 2013-11-21 DIAGNOSIS — I5042 Chronic combined systolic (congestive) and diastolic (congestive) heart failure: Secondary | ICD-10-CM

## 2013-11-21 DIAGNOSIS — Z95810 Presence of automatic (implantable) cardiac defibrillator: Secondary | ICD-10-CM | POA: Insufficient documentation

## 2013-11-21 LAB — POCT INR: INR: 3.2

## 2013-11-21 MED ORDER — AMLODIPINE BESYLATE 10 MG PO TABS: 5.0000 mg | ORAL_TABLET | Freq: Every day | ORAL | Status: DC

## 2013-11-21 NOTE — Patient Instructions (Signed)
Your physician recommends that you schedule a follow-up appointment in: 6 weeks with Dr. Patty Sermons  Your physician recommends that you have lab work today:  Basic metabolic panel  Your physician has recommended you make the following change in your medication:  DECREASE Amlodipine to 5 mg daily

## 2013-11-21 NOTE — Progress Notes (Signed)
[address] [place], [place]  [address] Phone: [phone] Fax:  [phone]  Date:  11/21/2013   ID:  Heather AmmonsJeritta R Angevine, DOB 07/02/1941, MRN [id number]  PCP:  Lemont Fillers'SULLIVAN,MELISSA S., NP  Cardiologist:  Dr. Cassell Clementhomas Brackbill     History of Present Illness: Heather AmmonsJeritta R Ida is a 73 y.o. female with a hx of AI s/p bioprosthetic AVR and ascending aorta replacement in 03/2012, NICM, systolic CHF, chronic AFib, CKD, prior aborted sudden cardiac death, s/p AICD in 2002, HTN, HL.  She was recently admitted 2/6-2/13 with acute on chronic combined systolic and diastolic CHF and atrial fibrillation with RVR in the setting of possible viral illness.  Echo demonstrated improved LVF with EF 45-50%.  She was diuresed with IV Lasix. Beta blockers were used for rate control.  She was seen by the internal medicine service and placed on prednisone taper.  Overall, her acute respiratory failure was felt to be multifactorial with a component of acute bronchitis/COPD exacerbation.  She was also treated with antibiotics, nebs.  Flu test was negative. She developed AKI and Lasix was placed on hold.  Blood pressures remained difficult to control. She required multiple medication adjustments.   ACE/ARB were avoided due to CKD.  She returns for cardiology follow up.  She also has follow up later today with pulmonary.    LHC (03/2012):  pCFX 30. Carotid US (03/2012):  No sig ICA stenosis.  Echo (07/2012):  EF 25-30%. Echocardiogram (11/06/11): Moderate LVH, EF 45-50%, no WMA, bioprosthetic AVR okay with mean gradient 11, mildly dilated aortic root at 43 mm, mild LAE, mild RPE, mildly reduced RVSF, trivial TR, trivial pericardial effusion.  She is here with her daughter.  She arrives in a wheelchair.  She notes that her breathing continues to improve.  Her cough is almost resolved.  No chest pain.  No orthopnea, PND, edema.  No syncope.  She has been weak. She felt dizzy recently and almost fell.  She has HHRN/HHPT  coming to her house.  She is walking with a walker.    Recent Labs: 07/11/2013: HDL Cholesterol 73; LDL (calc) 83  11/05/2013: ALT 12; Pro B Natriuretic peptide (BNP) 3807.0*; TSH 2.119  11/09/2013: Hemoglobin 14.7  11/11/2013: Creatinine 1.54*; Potassium 4.1   Wt Readings from Last 3 Encounters:  11/21/13 184 lb (83.462 kg)  11/11/13 185 lb 6.5 oz (84.1 kg)  09/14/13 192 lb (87.091 kg)     Past Medical History  Diagnosis Date  . History of ventricular tachycardia   . Peripheral edema   . Aortic insufficiency     Moderate 2012  . History of gout   . Exogenous obesity   . History of CHF (congestive heart failure)     EF 45% 2012, nonischemic (Normal coronaries 2002)  . 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 Hypertension   . 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 ICD (implantable cardiac defibrillator) in place 2012  . DOE (dyspnea on exertion)   . Stroke 2000  . Renal insufficiency   . Anxiety   . 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 Anemia   . Presence of IVC filter 2000  . Osteopenia 09/01/2013  . Coronary artery disease   . Myocardial infarction 2000  . Pacemaker   . CHF (congestive heart failure)     Current Outpatient Prescriptions  Medication Sig Dispense Refill  .  albuterol (PROVENTIL HFA;VENTOLIN HFA) 108 (90 BASE) MCG/ACT inhaler Inhale 2 puffs into the lungs every 6 (six) hours as needed for wheezing or shortness of breath.  1 Inhaler  2  . albuterol (PROVENTIL) (2.5 MG/3ML) 0.083% nebulizer solution three times a day alternating with ipratropium  DX:428.0, 518.81  75 mL  12  . allopurinol (ZYLOPRIM) 100 MG tablet Take 100 mg by mouth 2 (two) times daily.      [name] amLODipine (NORVASC) 10 MG tablet Take 1 tablet (10 mg total) by mouth daily.  30 tablet  5  . Biotin 5000 MCG CAPS Take 1 capsule (5,000 mcg total) by mouth daily.  30 capsule  6  . Bisacodyl  (DULCOLAX PO) Take 1 tablet by mouth daily as needed (for constipation).      . bisoprolol (ZEBETA) 5 MG tablet Take 1 tablet (5 mg total) by mouth 2 (two) times daily.  60 tablet  5  . Calcium Carb-Cholecalciferol (CALCIUM-VITAMIN D) 600-400 MG-UNIT TABS Take 1 tablet by mouth daily.      . colchicine 0.6 MG tablet Take 0.6-1.2 mg by mouth daily as needed (for gout flareups). Takes 2 tabs as needed, then takes 1 addt'l tablet about 1 hour later      . furosemide (LASIX) 40 MG tablet Take 40-80 mg by mouth 2 (two) times daily. Takes 80mg  in morning and 40mg  in evening      . hydrALAZINE (APRESOLINE) 25 MG tablet Take 1 tablet (25 mg total) by mouth every 8 (eight) hours.  90 tablet  5  . ipratropium (ATROVENT) 0.02 % nebulizer solution 2.5 mL (0.5 mg total) three times a day alternating with albuterol DX:428.0, 518.81  75 mL  12  . isosorbide mononitrate (IMDUR) 60 MG 24 hr tablet Take 1 tablet (60 mg total) by mouth daily.  30 tablet  5  . mometasone-formoterol (DULERA) 100-5 MCG/ACT AERO Inhale 2 puffs into the lungs 2 (two) times daily.  13 g  3  . NITROSTAT 0.4 MG SL tablet Place 0.4 mg under the tongue every 5 (five) minutes as needed. For chest pain      . pantoprazole (PROTONIX) 40 MG tablet Take 1 tablet (40 mg total) by mouth daily.  30 tablet  1  . potassium chloride SA (K-DUR,KLOR-CON) 20 MEQ tablet Take 20 mEq by mouth 2 (two) times daily.      . predniSONE (DELTASONE) 10 MG tablet Prednisone dosing: Take  Prednisone 40mg  (4 tabs) x 3 days, then taper to 30mg  (3 tabs) x 3 days, then 20mg  (2 tabs) x 3days, then 10mg  (1 tab) x 3days, then OFF.  Dispense:  30 tabs, refills: None  30 tablet  0  . rosuvastatin (CRESTOR) 5 MG tablet Take 5 mg by mouth daily.      [name] senna-docusate (SENOKOT S) 8.6-50 MG per tablet Take 2 tablets by mouth at bedtime. For constipation, also available OTC  60 tablet  3  . warfarin (COUMADIN) 5 MG tablet Take 2.5-5 mg by mouth daily at 6 PM. Takes 5mg  on Tuesday and  Thursday  Takes 2.5mg  all other days       No current facility-administered medications for this visit.    Allergies:   Adhesive; Latex; and Neomycin-bacitracin zn-polymyx   Social History:  The patient  reports that she has never smoked. She has never used smokeless tobacco. She reports that she does not drink alcohol or use illicit drugs.   Family History:  The patient's family history includes  Cancer in her father and son; Coronary artery disease (age of onset: 41) in her father; Hypertension in her father.   ROS:  Please see the history of present illness.   No bleeding problems.    All other systems reviewed and negative.   PHYSICAL EXAM: VS:  BP 110/70  Pulse 71  Ht 5' (1.524 m)  Wt 184 lb (83.462 kg)  BMI 35.94 kg/m2 Well nourished, well developed, in no acute distress HEENT: normal Neck: no JVDat 90 degreees Cardiac:  normal S1, S2; irregularly irregular rhythm; no murmur Lungs:  clear to auscultation bilaterally, no wheezing, rhonchi or rales Abd: soft, nontender, no hepatomegaly Ext: no edema Skin: warm and dry Neuro:  CNs 2-12 intact, no focal abnormalities noted  EKG:  AFib, HR 71, LAFB, septal Q waves, inf-lat TWI, no change from prior tracing  ASSESSMENT AND PLAN:  1. Chronic Combined Systolic and Diastolic CHF:  Volume stable.  Continue current Rx.  Check BMET today.  2. Aortic Insufficiency, s/p Bioprosthetic AVR + Ascending Aorta Replacement:  AVR ok by recent echo.  Continue SBE prophylaxis.   3. Non-Ischemic Cardiomyopathy:  Continue beta blocker, hydralazine, nitrates.  She is not a candidate for ACEI/ARB 2/2 CKD.  EF improved by recent echo.  She already has an ICD due to prior hx of aborted SCD.   4. Hypertension:  BP was difficult to control in the hospital and she had multiple medication adjustments (including changing carvedilol to bisoprolol for better cardio selectivity).  BP now running low and she has been weak and dizzy.  Decrease Norvasc to 5 mg QD.   If continues to run low, consider d/c'ing Norvasc.  5. Atrial Fibrillation:  Rate controlled.  She is tolerating coumadin.  Continue this.  INR to be checked today.  Continue Bisoprolol.   6. Chronic Kidney Disease:  Repeat BMET today.  7. S/p AICD:  F/u with EP as planned.  8. COPD:  F/u with pulmonary today.   9. Disposition:  F/u with Dr. [name] in 6 weeks.  Of note, she would like to transition to Dr. Tonny Bollman once Dr. Bernita Buffy.    Signed, Tereso Newcomer, PA-C  11/21/2013 12:13 PM

## 2013-11-21 NOTE — Progress Notes (Signed)
Subjective:    Patient ID: [name], female    DOB: 07/01/1941, 73 y.o.   MRN: [id number]  HPI  [name] was recently hospitalized for a CHF exacerbation and possibly influenza which caused wheezing.  Her flu swab which was negative.  Her daughter was sick first with a fever and some sinus draiage.  A few days later she got a fever, cough, and mucus production and then the CHF exacerbation.  Congestion in her chest was making her wheeze severely.  She was treated there for a CHF exacerbation and a possible COPD flare.  She never had lung trouble and thinks that she only had mild bronchitis once or twice in the past.  Rarely she would have a lingering cough, but never a serious illness.  She never smoked and had a normal childhood without respiratory problems.  She was discharged on Dulera twice a day, albuterol every six hours.  She has been taking these religiously. She has been using the nebulizer three times a day.  She feels better.    She thinks that the neublizer makes her breathing a little better.   She has lived with a smoker for her entire life until two years ago.    Past Medical History  Diagnosis Date  . History of ventricular tachycardia   . Peripheral edema   . Aortic insufficiency     Moderate 2012  . History of gout   . Exogenous obesity   . History of CHF (congestive heart failure)     EF 45% 2012, nonischemic (Normal coronaries 2002)  . 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 Hypertension   . 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 ICD (implantable cardiac defibrillator) in place 2012  . DOE (dyspnea on exertion)   . Stroke 2000  . Renal insufficiency   . Anxiety   . 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 Anemia   . Presence of IVC filter 2000  . Osteopenia 09/01/2013  . Coronary artery disease   . Myocardial infarction 2000  . Pacemaker   . CHF  (congestive heart failure)      Family History  Problem Relation Age of Onset  . Hypertension Father   . Cancer Father   . Coronary artery disease Father [id number]  . Cancer Son     pancreas, spine     History   Social History  . Marital Status: Divorced    Spouse Name: N/A    Number of Children: N/A  . Years of Education: N/A   Occupational History  . Not on file.   Social History Main Topics  . Smoking status: Never Smoker   . Smokeless tobacco: Never Used  . Alcohol Use: No  . Drug Use: No  . Sexual Activity: No   Other Topics Concern  . Not on file   Social History Narrative   Lives with daughter.       Allergies  Allergen Reactions  . Adhesive [Tape] Itching, Rash and Other (See Comments)    Redness and skin breaks down  . Latex Rash  . Neomycin-Bacitracin Zn-Polymyx Rash     Outpatient Prescriptions Prior to Visit  Medication Sig Dispense Refill  . albuterol (PROVENTIL HFA;VENTOLIN HFA) 108 (90 BASE) MCG/ACT inhaler Inhale 2 puffs into the lungs every 6 (six) hours as needed for wheezing or shortness of breath.  1 Inhaler  2  . albuterol (PROVENTIL) (2.5 MG/3ML) 0.083% nebulizer solution three times a day alternating with ipratropium  DX:428.0, 518.81  75 mL  12  . allopurinol (ZYLOPRIM) 100 MG tablet Take 100 mg by mouth 2 (two) times daily.      [name] amLODipine (NORVASC) 10 MG tablet Take 0.5 tablets (5 mg total) by mouth daily.  30 tablet  5  . Biotin 5000 MCG CAPS Take 1 capsule (5,000 mcg total) by mouth daily.  30 capsule  6  . Bisacodyl (DULCOLAX PO) Take 1 tablet by mouth daily as needed (for constipation).      . bisoprolol (ZEBETA) 5 MG tablet Take 1 tablet (5 mg total) by mouth 2 (two) times daily.  60 tablet  5  . Calcium Carb-Cholecalciferol (CALCIUM-VITAMIN D) 600-400 MG-UNIT TABS Take 1 tablet by mouth daily.      . colchicine 0.6 MG tablet Take 0.6-1.2 mg by mouth daily as needed (for gout flareups). Takes 2 tabs as needed, then takes 1 addt'l tablet  about 1 hour later      . furosemide (LASIX) 40 MG tablet Take 40-80 mg by mouth 2 (two) times daily. Takes 80mg  in morning and 40mg  in evening      . hydrALAZINE (APRESOLINE) 25 MG tablet Take 1 tablet (25 mg total) by mouth every 8 (eight) hours.  90 tablet  5  . ipratropium (ATROVENT) 0.02 % nebulizer solution 2.5 mL (0.5 mg total) three times a day alternating with albuterol DX:428.0, 518.81  75 mL  12  . isosorbide mononitrate (IMDUR) 60 MG 24 hr tablet Take 1 tablet (60 mg total) by mouth daily.  30 tablet  5  . mometasone-formoterol (DULERA) 100-5 MCG/ACT AERO Inhale 2 puffs into the lungs 2 (two) times daily.  13 g  3  . NITROSTAT 0.4 MG SL tablet Place 0.4 mg under the tongue every 5 (five) minutes as needed. For chest pain      . pantoprazole (PROTONIX) 40 MG tablet Take 1 tablet (40 mg total) by mouth daily.  30 tablet  1  . potassium chloride SA (K-DUR,KLOR-CON) 20 MEQ tablet Take 20 mEq by mouth 2 (two) times daily.      . predniSONE (DELTASONE) 10 MG tablet Prednisone dosing: Take  Prednisone 40mg  (4 tabs) x 3 days, then taper to 30mg  (3 tabs) x 3 days, then 20mg  (2 tabs) x 3days, then 10mg  (1 tab) x 3days, then OFF.  Dispense:  30 tabs, refills: None  30 tablet  0  . rosuvastatin (CRESTOR) 5 MG tablet Take 5 mg by mouth daily.      [name] senna-docusate (SENOKOT S) 8.6-50 MG per tablet Take 2 tablets by mouth at bedtime. For constipation, also available OTC  60 tablet  3  . warfarin (COUMADIN) 5 MG tablet Take 2.5-5 mg by mouth daily at 6 PM. Takes 5mg  on Tuesday and Thursday  Takes 2.5mg  all other days      . amLODipine (NORVASC) 10 MG tablet Take 1 tablet (10 mg total) by mouth daily.  30 tablet  5   No facility-administered medications prior to visit.      Review of Systems  Constitutional: Negative for fever and unexpected weight change.  HENT: Negative for congestion, dental problem, ear pain, nosebleeds, postnasal drip, rhinorrhea, sinus pressure, sneezing, sore throat and  trouble swallowing.   Eyes: Negative for redness and itching.  Respiratory: Positive for cough and shortness of breath. Negative for chest tightness and wheezing.   Cardiovascular:  Negative for palpitations and leg swelling.  Gastrointestinal: Negative for nausea and vomiting.  Genitourinary: Negative for dysuria.  Musculoskeletal: Positive for joint swelling.  Skin: Negative for rash.  Neurological: Positive for headaches.  Hematological: Does not bruise/bleed easily.  Psychiatric/Behavioral: Negative for dysphoric mood. The patient is not nervous/anxious.        Objective:   Physical Exam  Filed Vitals:   11/21/13 1632  BP: 112/70  Pulse: 72  Height: 5' (1.524 m)  Weight: 185 lb (83.915 kg)  SpO2: 96%   RA  Gen: overweight but well appearing, no acute distress HEENT: NCAT, PERRL, EOMi, OP clear, neck supple without masses PULM: CTA B CV: RRR, no mgr, no JVD AB: BS+, soft, nontender, no hsm Ext: warm, trace edema, no clubbing, no cyanosis Derm: no rash or skin breakdown Neuro: A&Ox4, CN II-XII intact, strength 5/5 in all 4 extremities  11/21/2013 simple spirometry normal     Assessment & Plan:   Acute bronchitis She had a recent hospitalization for bronchitis with wheezing in the setting of a viral illness and a CHF exacerbation.  The wheezing was most likely due to the CHF and viral bronchitis and not due to COPD.  Her exam, oximetry, and spirometry today are completely normal, so I don't see any evidence of a lung disease right now.  Plan: -use albuterol only on an as needed basis -stop Dulera and atrovent  Acute on chronic systolic CHF (congestive heart failure) Resolved.  Currently euvolemic and doing well.  Plan: -continue fluid, Na monitoring and f/u with cardiology   Updated Medication List Outpatient Encounter Prescriptions as of 11/21/2013  Medication Sig  . albuterol (PROVENTIL HFA;VENTOLIN HFA) 108 (90 BASE) MCG/ACT inhaler Inhale 2 puffs into the  lungs every 6 (six) hours as needed for wheezing or shortness of breath.  [name] albuterol (PROVENTIL) (2.5 MG/3ML) 0.083% nebulizer solution three times a day alternating with ipratropium  DX:428.0, 518.81  . allopurinol (ZYLOPRIM) 100 MG tablet Take 100 mg by mouth 2 (two) times daily.  [name] amLODipine (NORVASC) 10 MG tablet Take 0.5 tablets (5 mg total) by mouth daily.  . Biotin 5000 MCG CAPS Take 1 capsule (5,000 mcg total) by mouth daily.  . Bisacodyl (DULCOLAX PO) Take 1 tablet by mouth daily as needed (for constipation).  . bisoprolol (ZEBETA) 5 MG tablet Take 1 tablet (5 mg total) by mouth 2 (two) times daily.  . Calcium Carb-Cholecalciferol (CALCIUM-VITAMIN D) 600-400 MG-UNIT TABS Take 1 tablet by mouth daily.  . colchicine 0.6 MG tablet Take 0.6-1.2 mg by mouth daily as needed (for gout flareups). Takes 2 tabs as needed, then takes 1 addt'l tablet about 1 hour later  . furosemide (LASIX) 40 MG tablet Take 40-80 mg by mouth 2 (two) times daily. Takes 80mg  in morning and 40mg  in evening  . hydrALAZINE (APRESOLINE) 25 MG tablet Take 1 tablet (25 mg total) by mouth every 8 (eight) hours.  [name] ipratropium (ATROVENT) 0.02 % nebulizer solution 2.5 mL (0.5 mg total) three times a day alternating with albuterol DX:428.0, 518.81  . isosorbide mononitrate (IMDUR) 60 MG 24 hr tablet Take 1 tablet (60 mg total) by mouth daily.  . mometasone-formoterol (DULERA) 100-5 MCG/ACT AERO Inhale 2 puffs into the lungs 2 (two) times daily.  [name] NITROSTAT 0.4 MG SL tablet Place 0.4 mg under the tongue every 5 (five) minutes as needed. For chest pain  . pantoprazole (PROTONIX) 40 MG tablet Take 1 tablet (40 mg total) by mouth daily.  . potassium  chloride SA (K-DUR,KLOR-CON) 20 MEQ tablet Take 20 mEq by mouth 2 (two) times daily.  . predniSONE (DELTASONE) 10 MG tablet Prednisone dosing: Take  Prednisone 40mg  (4 tabs) x 3 days, then taper to 30mg  (3 tabs) x 3 days, then 20mg  (2 tabs) x 3days, then 10mg  (1 tab) x 3days, then  OFF.  Dispense:  30 tabs, refills: None  . rosuvastatin (CRESTOR) 5 MG tablet Take 5 mg by mouth daily.  [name] senna-docusate (SENOKOT S) 8.6-50 MG per tablet Take 2 tablets by mouth at bedtime. For constipation, also available OTC  . warfarin (COUMADIN) 5 MG tablet Take 2.5-5 mg by mouth daily at 6 PM. Takes 5mg  on Tuesday and Thursday  Takes 2.5mg  all other days  . [DISCONTINUED] amLODipine (NORVASC) 10 MG tablet Take 1 tablet (10 mg total) by mouth daily.

## 2013-11-21 NOTE — Assessment & Plan Note (Signed)
Resolved.  Currently euvolemic and doing well.  Plan: -continue fluid, Na monitoring and f/u with cardiology

## 2013-11-21 NOTE — Assessment & Plan Note (Signed)
She had a recent hospitalization for bronchitis with wheezing in the setting of a viral illness and a CHF exacerbation.  The wheezing was most likely due to the CHF and viral bronchitis and not due to COPD.  Her exam, oximetry, and spirometry today are completely normal, so I don't see any evidence of a lung disease right now.  Plan: -use albuterol only on an as needed basis -stop Dulera and atrovent

## 2013-11-21 NOTE — Addendum Note (Signed)
Addended by: Tonita Phoenix on: 11/21/2013 01:06 PM   Modules accepted: Orders

## 2013-11-21 NOTE — Patient Instructions (Signed)
You do not have COPD You can stop the [facility] and ipratropium Continue to use albuterol if you feel shortness of breath or wheezing. We will see you back on an as needed basis

## 2013-11-22 LAB — BASIC METABOLIC PANEL
BUN: 59 mg/dL — AB (ref 6–23)
CALCIUM: 9.1 mg/dL (ref 8.4–10.5)
CHLORIDE: 102 meq/L (ref 96–112)
CO2: 24 meq/L (ref 19–32)
CREATININE: 2.3 mg/dL — AB (ref 0.4–1.2)
GFR: 22.74 mL/min — ABNORMAL LOW (ref >60.00)
Glucose, Bld: 109 mg/dL — ABNORMAL HIGH (ref 70–99)
Potassium: 4.2 mEq/L (ref 3.5–5.1)
Sodium: 137 mEq/L (ref 135–145)

## 2013-11-23 ENCOUNTER — Telehealth: Payer: Self-pay | Admitting: *Deleted

## 2013-11-23 DIAGNOSIS — I5043 Acute on chronic combined systolic (congestive) and diastolic (congestive) heart failure: Secondary | ICD-10-CM

## 2013-11-23 DIAGNOSIS — I4891 Unspecified atrial fibrillation: Secondary | ICD-10-CM

## 2013-11-23 MED ORDER — FUROSEMIDE 40 MG PO TABS: 40.0000 mg | ORAL_TABLET | Freq: Two times a day (BID) | ORAL | Status: DC

## 2013-11-23 NOTE — Telephone Encounter (Signed)
Called pt about her lab results and I asked if she wanted to go results or should I go over results with her daughter [name]; pt asked if I could go over results w/ [name]. I said sure no problem. LMOM on Kay's cell # for cb.

## 2013-11-23 NOTE — Telephone Encounter (Signed)
Per pt she asked me to s/w her grandaughter Alvis Lemmings since her daughter [name] was not home from work yet. I gave [name] the med changes and repeat bmet 11/30/13 ok per Bing Neighbors. PA. [name] verbalized understanding to all instructions and Plan of Care

## 2013-11-29 ENCOUNTER — Other Ambulatory Visit: Payer: Self-pay | Admitting: Cardiology

## 2013-11-30 ENCOUNTER — Telehealth: Payer: Self-pay | Admitting: *Deleted

## 2013-11-30 ENCOUNTER — Ambulatory Visit (INDEPENDENT_AMBULATORY_CARE_PROVIDER_SITE_OTHER): Payer: Medicare Other | Admitting: Pharmacist

## 2013-11-30 ENCOUNTER — Other Ambulatory Visit (INDEPENDENT_AMBULATORY_CARE_PROVIDER_SITE_OTHER): Payer: Medicare Other

## 2013-11-30 DIAGNOSIS — I5043 Acute on chronic combined systolic (congestive) and diastolic (congestive) heart failure: Secondary | ICD-10-CM

## 2013-11-30 DIAGNOSIS — I509 Heart failure, unspecified: Secondary | ICD-10-CM

## 2013-11-30 DIAGNOSIS — I4891 Unspecified atrial fibrillation: Secondary | ICD-10-CM

## 2013-11-30 DIAGNOSIS — Z5181 Encounter for therapeutic drug level monitoring: Secondary | ICD-10-CM

## 2013-11-30 DIAGNOSIS — I352 Nonrheumatic aortic (valve) stenosis with insufficiency: Secondary | ICD-10-CM

## 2013-11-30 DIAGNOSIS — Z954 Presence of other heart-valve replacement: Secondary | ICD-10-CM

## 2013-11-30 DIAGNOSIS — I359 Nonrheumatic aortic valve disorder, unspecified: Secondary | ICD-10-CM

## 2013-11-30 DIAGNOSIS — I10 Essential (primary) hypertension: Secondary | ICD-10-CM

## 2013-11-30 LAB — BASIC METABOLIC PANEL
BUN: 20 mg/dL (ref 6–23)
CALCIUM: 9.2 mg/dL (ref 8.4–10.5)
CO2: 26 mEq/L (ref 19–32)
Chloride: 105 mEq/L (ref 96–112)
Creatinine, Ser: 1.4 mg/dL — ABNORMAL HIGH (ref 0.4–1.2)
GFR: 39.31 mL/min — AB (ref >60.00)
GLUCOSE: 90 mg/dL (ref 70–99)
Potassium: 3.3 mEq/L — ABNORMAL LOW (ref 3.5–5.1)
SODIUM: 141 meq/L (ref 135–145)

## 2013-11-30 LAB — POCT INR: INR: 1.9

## 2013-11-30 NOTE — Telephone Encounter (Signed)
I tried to reach pt and her daughter [name] but no answer; I will try again tomorrow.

## 2013-12-01 MED ORDER — POTASSIUM CHLORIDE CRYS ER 20 MEQ PO TBCR: 20.0000 meq | EXTENDED_RELEASE_TABLET | Freq: Three times a day (TID) | ORAL | Status: DC

## 2013-12-01 NOTE — Telephone Encounter (Signed)
s/w pt's daughter [name] who is caregiver for pt. Pt aksed we s/w daughter [name] about results. Bmet 3/16 when she see Dr. Graciela Husbands and CVRR appt. Advised Increase K+ to 20 meq TID, refill sent in.

## 2013-12-12 ENCOUNTER — Ambulatory Visit (INDEPENDENT_AMBULATORY_CARE_PROVIDER_SITE_OTHER): Payer: Medicare Other | Admitting: Internal Medicine

## 2013-12-12 ENCOUNTER — Other Ambulatory Visit (INDEPENDENT_AMBULATORY_CARE_PROVIDER_SITE_OTHER): Payer: Medicare Other

## 2013-12-12 ENCOUNTER — Ambulatory Visit (INDEPENDENT_AMBULATORY_CARE_PROVIDER_SITE_OTHER): Payer: Medicare Other | Admitting: Pharmacist

## 2013-12-12 ENCOUNTER — Encounter: Payer: Self-pay | Admitting: Internal Medicine

## 2013-12-12 VITALS — BP 144/89 | HR 107 | Ht 61.0 in | Wt 186.8 lb

## 2013-12-12 DIAGNOSIS — Z5181 Encounter for therapeutic drug level monitoring: Secondary | ICD-10-CM

## 2013-12-12 DIAGNOSIS — I352 Nonrheumatic aortic (valve) stenosis with insufficiency: Secondary | ICD-10-CM

## 2013-12-12 DIAGNOSIS — I5043 Acute on chronic combined systolic (congestive) and diastolic (congestive) heart failure: Secondary | ICD-10-CM

## 2013-12-12 DIAGNOSIS — I428 Other cardiomyopathies: Secondary | ICD-10-CM

## 2013-12-12 DIAGNOSIS — Z95810 Presence of automatic (implantable) cardiac defibrillator: Secondary | ICD-10-CM

## 2013-12-12 DIAGNOSIS — I469 Cardiac arrest, cause unspecified: Secondary | ICD-10-CM

## 2013-12-12 DIAGNOSIS — Z954 Presence of other heart-valve replacement: Secondary | ICD-10-CM

## 2013-12-12 DIAGNOSIS — I359 Nonrheumatic aortic valve disorder, unspecified: Secondary | ICD-10-CM

## 2013-12-12 DIAGNOSIS — I509 Heart failure, unspecified: Secondary | ICD-10-CM

## 2013-12-12 DIAGNOSIS — I4891 Unspecified atrial fibrillation: Secondary | ICD-10-CM

## 2013-12-12 DIAGNOSIS — I10 Essential (primary) hypertension: Secondary | ICD-10-CM

## 2013-12-12 LAB — MDC_IDC_ENUM_SESS_TYPE_INCLINIC
Battery Voltage: 3.06 V
Brady Statistic RV Percent Paced: 2.62 %
Date Time Interrogation Session: 20150316121555
HIGH POWER IMPEDANCE MEASURED VALUE: 67 Ohm
HighPow Impedance: 48 Ohm
Lead Channel Pacing Threshold Pulse Width: 0.6 ms
Lead Channel Setting Pacing Amplitude: 2.5 V
Lead Channel Setting Sensing Sensitivity: 0.3 mV
MDC IDC MSMT LEADCHNL RV IMPEDANCE VALUE: 896 Ohm
MDC IDC MSMT LEADCHNL RV PACING THRESHOLD AMPLITUDE: 0.5 V
MDC IDC MSMT LEADCHNL RV SENSING INTR AMPL: 10.6496
MDC IDC SET LEADCHNL RV PACING PULSEWIDTH: 0.6 ms
MDC IDC SET ZONE DETECTION INTERVAL: 290 ms
Zone Setting Detection Interval: 330 ms
Zone Setting Detection Interval: 450 ms

## 2013-12-12 LAB — BASIC METABOLIC PANEL
BUN: 19 mg/dL (ref 6–23)
CO2: 31 mEq/L (ref 19–32)
CREATININE: 1.4 mg/dL — AB (ref 0.4–1.2)
Calcium: 9.1 mg/dL (ref 8.4–10.5)
Chloride: 104 mEq/L (ref 96–112)
GFR: 38.35 mL/min — ABNORMAL LOW (ref >60.00)
Glucose, Bld: 90 mg/dL (ref 70–99)
POTASSIUM: 4.2 meq/L (ref 3.5–5.1)
Sodium: 143 mEq/L (ref 135–145)

## 2013-12-12 LAB — POCT INR: INR: 2.2

## 2013-12-12 MED ORDER — BISOPROLOL FUMARATE 10 MG PO TABS: 10.0000 mg | ORAL_TABLET | Freq: Two times a day (BID) | ORAL | Status: DC

## 2013-12-12 NOTE — Patient Instructions (Addendum)
Your physician has recommended you make the following change in your medication:  1) Increase Bisoprolol to 10 mg twice a  day  Remote monitoring is used to monitor your Pacemaker of ICD from home. This monitoring reduces the number of office visits required to check your device to one time per year. It allows [place] to keep an eye on the functioning of your device to ensure it is working properly. You are scheduled for a device check from home on 03/15/14. You may send your transmission at any time that day. If you have a wireless device, the transmission will be sent automatically. After your physician reviews your transmission, you will receive a postcard with your next transmission date.  Your physician wants you to follow-up in: 1 year with Dr. Graciela Husbands.  You will receive a reminder letter in the mail two months in advance. If you don't receive a letter, please call our office to schedule the follow-up appointment.

## 2013-12-12 NOTE — Progress Notes (Signed)
Patient Care Team: Sandford CrazeMelissa O'Sullivan, NP as PCP - General (Internal Medicine) Duke SalviaSteven C Klein, MD (Cardiology) Cassell Clementhomas Brackbill, MD (Cardiology)   HPI  Vernie AmmonsJeritta R [name] is a 73 y.o. female is seen in followup for aborted cardiac arrest s/p implanted ICD. 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 She underwent aortic valve  Bioprosthetic replacement fall 2013. She is significant dilatation of her LV   She was hospitalized in February for multifactorial dyspnea including pulmonary edema, pneumonia and some bronchitis.  Her carvedilol was changed to bisoprolol; her exercise tolerance has remained somewhat limited.  She continues to have discomfort in her left shoulder. She is walking with a walker    LHC (03/2012): pCFX 30.  Carotid US (03/2012): No sig ICA stenosis.  Echo (07/2012): EF 25-30%.  Echocardiogram (11/06/11): Moderate LVH, EF 45-50%, no WMA, bioprosthetic AVR okay with mean gradient 11, mildly dilated aortic root at 43 mm, mild LAE, mild RPE, mildly reduced RVSF, trivial TR, trivial pericardial effusion.       Past Medical History  Diagnosis Date  . History of ventricular tachycardia   . Peripheral edema   . Aortic insufficiency     Moderate 2012  . History of gout   . Exogenous obesity   . History of CHF (congestive heart failure)     EF 45% 2012, nonischemic (Normal coronaries 2002)  . 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 Hypertension   . 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 ICD (implantable cardiac defibrillator) in place 2012  . DOE (dyspnea on exertion)   . Stroke 2000  . Renal insufficiency   . Anxiety   . 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 Anemia   . Presence of IVC filter 2000  . Osteopenia 09/01/2013  . Coronary artery disease   . Myocardial infarction  2000  . Pacemaker   . CHF (congestive heart failure)     Past Surgical History  Procedure Laterality Date  . Cardiac defibrillator placement    . Cataract extraction    . Embolectomy  02/15/2012    Procedure: EMBOLECTOMY;  Surgeon: Chuck Hinthristopher S Dickson, MD;  [facility];  Service: Vascular;  Laterality: Left;  left popliteal embolectomy with drain placement  . Insert / replace / remove pacemaker    . Eye surgery  2012    removal of cataracts bilaterally  . Cardiac catheterization    . Tonsillectomy    . Aortic valve replacement  04/12/2012    Procedure: AORTIC VALVE REPLACEMENT (AVR);  Surgeon: Delight OvensEdward B Gerhardt, MD;  [facility];  Service: Open Heart Surgery;  Laterality: N/A;  with nitric oxide  . Thoracic aortic aneurysm repair  04/12/2012    Procedure: THORACIC ASCENDING ANEURYSM REPAIR (AAA);  Surgeon: Delight OvensEdward B Gerhardt, MD;  [facility];  Service: Open Heart Surgery;  Laterality: N/A;  . Clot removal  5.2013    blood clot removed from the back of the left leg  . Aortic valve surgery  7.2013  . Coronary artery bypass graft  2014  . Cardiac valve replacement      Current Outpatient Prescriptions  Medication Sig Dispense Refill  . albuterol (PROVENTIL) (2.5 MG/3ML) 0.083% nebulizer solution three times a day alternating with ipratropium  DX:428.0, 518.81  75 mL  12  . allopurinol (ZYLOPRIM) 100 MG  tablet Take 100 mg by mouth 2 (two) times daily.      [name] amLODipine (NORVASC) 10 MG tablet Take 0.5 tablets (5 mg total) by mouth daily.  30 tablet  5  . Biotin 5000 MCG CAPS Take 1 capsule (5,000 mcg total) by mouth daily.  30 capsule  6  . Bisacodyl (DULCOLAX PO) Take 1 tablet by mouth daily as needed (for constipation).      . bisoprolol (ZEBETA) 5 MG tablet Take 1 tablet (5 mg total) by mouth 2 (two) times daily.  60 tablet  5  . Calcium Carb-Cholecalciferol (CALCIUM-VITAMIN D) 600-400 MG-UNIT TABS Take 1 tablet by mouth daily.      . furosemide (LASIX) 40 MG tablet Take  1 tablet (40 mg total) by mouth 2 (two) times daily.  30 tablet    . isosorbide mononitrate (IMDUR) 60 MG 24 hr tablet Take 1 tablet (60 mg total) by mouth daily.  30 tablet  5  . mometasone-formoterol (DULERA) 100-5 MCG/ACT AERO Inhale 2 puffs into the lungs 2 (two) times daily.  13 g  3  . NITROSTAT 0.4 MG SL tablet Place 0.4 mg under the tongue every 5 (five) minutes as needed. For chest pain      . pantoprazole (PROTONIX) 40 MG tablet Take 1 tablet (40 mg total) by mouth daily.  30 tablet  1  . potassium chloride SA (K-DUR,KLOR-CON) 20 MEQ tablet Take 1 tablet (20 mEq total) by mouth 3 (three) times daily.  90 tablet  11  . predniSONE (DELTASONE) 10 MG tablet Prednisone dosing: Take  Prednisone 40mg  (4 tabs) x 3 days, then taper to 30mg  (3 tabs) x 3 days, then 20mg  (2 tabs) x 3days, then 10mg  (1 tab) x 3days, then OFF.  Dispense:  30 tabs, refills: None  30 tablet  0  . rosuvastatin (CRESTOR) 5 MG tablet Take 5 mg by mouth daily.      [name] senna-docusate (SENOKOT S) 8.6-50 MG per tablet Take 2 tablets by mouth at bedtime. For constipation, also available OTC  60 tablet  3  . warfarin (COUMADIN) 5 MG tablet Take as directed by coumadin clinic  30 tablet  3   No current facility-administered medications for this visit.    Allergies  Allergen Reactions  . Adhesive [Tape] Itching, Rash and Other (See Comments)    Redness and skin breaks down  . Latex Rash  . Neomycin-Bacitracin Zn-Polymyx Rash    Review of Systems negative except from HPI and PMH  Physical Exam BP 144/89  Pulse 107  Ht 5\' 1"  (1.549 m)  Wt 186 lb 12 oz (84.709 kg)  BMI 35.30 kg/m2 Well developed and well nourished in no acute distress HENT normal E scleral and icterus clear Neck Supple JVP flat; carotids brisk and full Clear to ausculation *Regular rate and rhythm, no murmurs gallops or rub Soft with active bowel sounds No clubbing cyanosis no Edema Alert and oriented, grossly normal motor and sensory  function Skin Warm and Dry    Assessment and  Plan  Atrial fibrillation-permanent  Cardiac arrest-aborted  ICD The patient's device was interrogated.  The information was reviewed. No changes were made in the programming.     HFpEF  The patient is relatively euvolemic. We'll continue her on her current diuretics.  Review of her heart rate suggest inadequate rate control and this is new since her hospitalization likely related to discontinuation of her carvedilol institution bisoprolol. We'll increase her bisoprolol from 10-20 mg total  daily.

## 2013-12-13 ENCOUNTER — Ambulatory Visit: Payer: Medicare Other | Admitting: Family

## 2013-12-20 ENCOUNTER — Telehealth: Payer: Self-pay | Admitting: Cardiology

## 2013-12-20 NOTE — Telephone Encounter (Signed)
New Message  Pt states she has gained 10 lbs in the last month. Pt says her "[employer] nurse" that monitors her wt told her to call the ask and ask if she needs a dosage change with her lasix... Pt would like a call back from the nurse.

## 2013-12-20 NOTE — Telephone Encounter (Signed)
Would keep it at 40 mg BID unless she starts to have swelling or dyspnea in which case she can increase to 80 mg in am and 40 mg in pm

## 2013-12-20 NOTE — Telephone Encounter (Signed)
Spoke with patient and she has been watching her diet but weight up 10 pounds since being discharged from the hospital 11/11/13.  Patient denies any increased shortness of breath or swelling. She did take an extra Lasix this am secondary to weight being up over 2 pounds since yesterday, has had increased urinary output today. Current Lasix dose is 40 mg twice a day. Patient was discharged home on Lasix 80 mg in am and 40 in pm but was reduced by Bing Neighbors PA after abnormal labs (BUN 59 Cr 2.3) to Lasix 40 mg twice a day. Will forward to  Dr. Patty Sermons for review

## 2013-12-20 NOTE — Telephone Encounter (Signed)
Advised patient, verbalized understanding  

## 2013-12-29 ENCOUNTER — Other Ambulatory Visit: Payer: Self-pay | Admitting: Cardiology

## 2013-12-29 ENCOUNTER — Other Ambulatory Visit: Payer: Self-pay | Admitting: Family

## 2013-12-29 NOTE — Telephone Encounter (Signed)
Not on current med list. Is this ok to refill? Please advise. Thanks, MI

## 2013-12-30 NOTE — Telephone Encounter (Signed)
Refill sent. I would like to see pt back in the office in next 1 month please.

## 2014-01-02 NOTE — Telephone Encounter (Signed)
Please call pt to arrange f/u in 1 month.

## 2014-01-03 NOTE — Telephone Encounter (Signed)
Left message for patient to return my call.

## 2014-01-04 NOTE — Telephone Encounter (Signed)
Left message for patient to return my call.

## 2014-01-05 ENCOUNTER — Ambulatory Visit (INDEPENDENT_AMBULATORY_CARE_PROVIDER_SITE_OTHER): Payer: Medicare Other | Admitting: Pharmacist Clinician (PhC)/ Clinical Pharmacy Specialist

## 2014-01-05 DIAGNOSIS — Z5181 Encounter for therapeutic drug level monitoring: Secondary | ICD-10-CM

## 2014-01-05 DIAGNOSIS — I352 Nonrheumatic aortic (valve) stenosis with insufficiency: Secondary | ICD-10-CM

## 2014-01-05 DIAGNOSIS — I4891 Unspecified atrial fibrillation: Secondary | ICD-10-CM

## 2014-01-05 DIAGNOSIS — Z954 Presence of other heart-valve replacement: Secondary | ICD-10-CM

## 2014-01-05 DIAGNOSIS — I359 Nonrheumatic aortic valve disorder, unspecified: Secondary | ICD-10-CM

## 2014-01-05 LAB — POCT INR: INR: 1.9

## 2014-01-05 NOTE — Telephone Encounter (Signed)
Left message for patient to return my call.

## 2014-01-09 NOTE — Telephone Encounter (Signed)
Mailed letter to pt

## 2014-01-12 ENCOUNTER — Other Ambulatory Visit: Payer: Self-pay

## 2014-01-12 ENCOUNTER — Other Ambulatory Visit: Payer: Self-pay | Admitting: Family

## 2014-01-12 MED ORDER — ASPIRIN EC 81 MG PO TBEC: 81.0000 mg | DELAYED_RELEASE_TABLET | Freq: Every day | ORAL | Status: DC

## 2014-01-13 ENCOUNTER — Other Ambulatory Visit: Payer: Self-pay

## 2014-01-13 ENCOUNTER — Encounter: Payer: Self-pay | Admitting: Cardiology

## 2014-01-13 ENCOUNTER — Ambulatory Visit (INDEPENDENT_AMBULATORY_CARE_PROVIDER_SITE_OTHER): Payer: Medicare Other | Admitting: Cardiology

## 2014-01-13 VITALS — BP 142/70 | HR 60 | Ht 61.0 in | Wt 192.0 lb

## 2014-01-13 DIAGNOSIS — I4891 Unspecified atrial fibrillation: Secondary | ICD-10-CM

## 2014-01-13 DIAGNOSIS — Z8639 Personal history of other endocrine, nutritional and metabolic disease: Secondary | ICD-10-CM

## 2014-01-13 DIAGNOSIS — I5043 Acute on chronic combined systolic (congestive) and diastolic (congestive) heart failure: Secondary | ICD-10-CM

## 2014-01-13 DIAGNOSIS — M109 Gout, unspecified: Secondary | ICD-10-CM

## 2014-01-13 DIAGNOSIS — Z8739 Personal history of other diseases of the musculoskeletal system and connective tissue: Secondary | ICD-10-CM

## 2014-01-13 DIAGNOSIS — I10 Essential (primary) hypertension: Secondary | ICD-10-CM

## 2014-01-13 DIAGNOSIS — Z862 Personal history of diseases of the blood and blood-forming organs and certain disorders involving the immune mechanism: Secondary | ICD-10-CM

## 2014-01-13 MED ORDER — ROSUVASTATIN CALCIUM 5 MG PO TABS: 5.0000 mg | ORAL_TABLET | Freq: Every day | ORAL | Status: DC

## 2014-01-13 MED ORDER — ASPIRIN EC 81 MG PO TBEC: 81.0000 mg | DELAYED_RELEASE_TABLET | Freq: Every day | ORAL | Status: DC

## 2014-01-13 MED ORDER — ALLOPURINOL 100 MG PO TABS: 100.0000 mg | ORAL_TABLET | Freq: Two times a day (BID) | ORAL | Status: DC

## 2014-01-13 MED ORDER — FUROSEMIDE 40 MG PO TABS: ORAL_TABLET | ORAL | Status: DC

## 2014-01-13 NOTE — Assessment & Plan Note (Signed)
Her dyspnea has improved since last visit.  She is tolerating the bisoprolol.  Her exercise tolerance has improved

## 2014-01-13 NOTE — Assessment & Plan Note (Signed)
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  She is on allopurinol.  We will check a uric acid at her next visit

## 2014-01-13 NOTE — Assessment & Plan Note (Signed)
The patient is in permanent atrial fibrillation and is on Coumadin.

## 2014-01-13 NOTE — Telephone Encounter (Signed)
Rx request to pharmacy; Patient Needs Appointment Prior to Future Refills/SLS

## 2014-01-13 NOTE — Patient Instructions (Signed)
Your physician recommends that you continue on your current medications as directed. Please refer to the Current Medication list given to you today.  Your physician wants you to follow-up in: 4 months with fasting labs (lp/bmet/hfp/cbc/uric acid) and ekg   You will receive a reminder letter in the mail two months in advance. If you don't receive a letter, please call our office to schedule the follow-up appointment.

## 2014-01-13 NOTE — Progress Notes (Signed)
[name] Date of Birth:  08/13/1941 [address] [place], [place]  [address] [phone]         Fax   [phone]  History of Present Illness: This pleasant 73 year old woman is seen for a post hospital office visit. 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 [name]. 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 She also has a dilated left ventricle with marked decrease in left ventricular systolic function. She is now on bisoprolol in place of carvedilol. Her symptoms of congestive heart failure have improved significantly since surgery. She is no longer having any ankle edema or significant orthopnea. Her weight is down 4 pounds since last visit.  She has a good appetite.  She now lives with her daughter at [place]. She remains in chronic atrial fibrillation and is now on Coumadin. Clinically she is much improved since her surgery.  She is getting ready to go to the beach 2 visit her daughter for several weeks. In February 2014 she was hospitalized with respiratory problems and exacerbation of CHF  Current Outpatient Prescriptions  Medication Sig Dispense Refill  . allopurinol (ZYLOPRIM) 100 MG tablet Take 1 tablet (100 mg total) by mouth 2 (two) times daily.  60 tablet  5  . amLODipine (NORVASC) 10 MG tablet Take 0.5 tablets (5 mg total) by mouth daily.  30 tablet  5  . Biotin 5000 MCG CAPS Take 1 capsule (5,000 mcg total) by mouth daily.  30 capsule  6  . Bisacodyl (DULCOLAX PO) Take 1 tablet by mouth daily as needed (for constipation).      . bisoprolol (ZEBETA) 10 MG tablet Take 1 tablet (10 mg total) by mouth 2 (two) times daily.  60 tablet  6  . Calcium Carb-Cholecalciferol (CALCIUM-VITAMIN D) 600-400 MG-UNIT TABS Take 1 tablet by mouth daily.      . furosemide (LASIX) 40 MG tablet TAKE 1 TABLET TWICE A DAY  60 tablet  5  . potassium  chloride SA (K-DUR,KLOR-CON) 20 MEQ tablet Take 1 tablet (20 mEq total) by mouth 3 (three) times daily.  90 tablet  11  . rosuvastatin (CRESTOR) 5 MG tablet Take 1 tablet (5 mg total) by mouth daily.  30 tablet  5  . senna-docusate (SENOKOT S) 8.6-50 MG per tablet Take 2 tablets by mouth at bedtime. For constipation, also available OTC  60 tablet  3  . warfarin (COUMADIN) 5 MG tablet Take as directed by coumadin clinic  30 tablet  3  . aspirin EC 81 MG tablet Take 1 tablet (81 mg total) by mouth daily.  90 tablet  3   No current facility-administered medications for this visit.    Allergies  Allergen Reactions  . Adhesive [Tape] Itching, Rash and Other (See Comments)    Redness and skin breaks down  . Latex Rash  . Neomycin-Bacitracin Zn-Polymyx Rash    Patient Active Problem List   Diagnosis Date Noted  . Aortic insufficiency and aortic stenosis 01/01/2011    Priority: High  . Atrial fibrillation 01/02/2009    Priority: High  . Encounter for therapeutic drug monitoring 11/21/2013  . S/P implantation of automatic cardioverter/defibrillator (AICD) 11/21/2013  . Hypertensive urgency, malignant 11/08/2013  . Acute bronchitis 11/08/2013  . Acute CHF 11/04/2013  . Acute on chronic systolic CHF (congestive heart failure) 11/04/2013  . CKD (chronic kidney disease), stage III  11/04/2013  . A-fib 11/04/2013  . Acute respiratory failure 11/04/2013  . Gout 09/19/2013  . Osteopenia 09/01/2013  . Osteopenia 09/01/2013  . Chronic constipation 07/11/2013  . Frozen shoulder 12/01/2012  . Annual physical exam 07/08/2012  . Heart valve replaced by other means 04/27/2012  . Atherosclerosis of native arteries of the extremities with intermittent claudication 03/03/2012  . Anemia 02/17/2012  . Lower limb ischemia 02/14/2012  . Chronic systolic congestive heart failure, NYHA class 3 02/14/2012  . Acute on chronic kidney disease, stage 3 02/14/2012  . Cardiac arrest 11/04/2011  . Implantable  cardiac defibrillator single-chamber-Medtronic 11/04/2011  . Alopecia 11/04/2011  . ALKALINE PHOSPHATASE, ELEVATED 12/12/2010  . COMBINED HEART FAILURE, ACUTE ON CHRONIC 11/25/2010  . BACK PAIN, LUMBAR 12/19/2009  . Memory loss 09/05/2009  . VENOUS INSUFFICIENCY 01/19/2009  . HYPERLIPIDEMIA 01/02/2009  . GOUT 01/02/2009  . HYPERTENSION 01/02/2009    History  Smoking status  . Never Smoker   Smokeless tobacco  . Never Used    History  Alcohol Use No    Family History  Problem Relation Age of Onset  . Hypertension Father   . Cancer Father   . Coronary artery disease Father [id number]  . Cancer Son     pancreas, spine    Review of Systems: Constitutional: no fever chills diaphoresis or fatigue or change in weight.  Head and neck: no hearing loss, no epistaxis, no photophobia or visual disturbance. Respiratory: No cough, shortness of breath or wheezing. Cardiovascular: No chest pain peripheral edema, palpitations. Gastrointestinal: No abdominal distention, no abdominal pain, no change in bowel habits hematochezia or melena. Genitourinary: No dysuria, no frequency, no urgency, no nocturia. Musculoskeletal:No arthralgias, no back pain, no gait disturbance or myalgias. Neurological: No dizziness, no headaches, no numbness, no seizures, no syncope, no weakness, no tremors. Hematologic: No lymphadenopathy, no easy bruising. Psychiatric: No confusion, no hallucinations, no sleep disturbance.    Physical Exam: Filed Vitals:   01/13/14 0824  BP: 142/70  Pulse: 60   the general appearance reveals a well-developed well-nourished elderly woman in no distress.The head and neck exam reveals pupils equal and reactive.  Extraocular movements are full.  There is no scleral icterus.  The mouth and pharynx are normal.  The neck is supple.  The carotids reveal no bruits.  The jugular venous pressure is normal.  The  thyroid is not enlarged.  There is no lymphadenopathy.  The chest is clear to  percussion and auscultation.  There are no rales or rhonchi.  Expansion of the chest is symmetrical.  The pulse is irregularly irregular.  The ICD is in the left upper anterior chest. The precordium is quiet.  The first heart sound is normal.  The second heart sound is physiologically split.  There is a soft systolic flow murmur across the prosthetic aortic valve.  No diastolic murmur.  There is no abnormal lift or heave.  The abdomen is soft and nontender.  The bowel sounds are normal.  The liver and spleen are not enlarged.  There are no abdominal masses.  There are no abdominal bruits.  Extremities reveal good pedal pulses.  There is no phlebitis or edema.  There is no cyanosis or clubbing.  Strength is normal and symmetrical in all extremities.  There is no lateralizing weakness.  There are no sensory deficits.  The skin is warm and dry.  There is no rash.    Assessment / Plan: Continue on same medication.  Work harder on losing weight.  Recheck in 4 months for office visit and EKG CBC, uric acid, lipid panel, hepatic function panel, and basal metabolic panel.

## 2014-02-02 ENCOUNTER — Ambulatory Visit (INDEPENDENT_AMBULATORY_CARE_PROVIDER_SITE_OTHER): Payer: Medicare Other | Admitting: *Deleted

## 2014-02-02 DIAGNOSIS — I4891 Unspecified atrial fibrillation: Secondary | ICD-10-CM

## 2014-02-02 DIAGNOSIS — I352 Nonrheumatic aortic (valve) stenosis with insufficiency: Secondary | ICD-10-CM

## 2014-02-02 DIAGNOSIS — Z954 Presence of other heart-valve replacement: Secondary | ICD-10-CM

## 2014-02-02 DIAGNOSIS — Z5181 Encounter for therapeutic drug level monitoring: Secondary | ICD-10-CM

## 2014-02-02 DIAGNOSIS — I359 Nonrheumatic aortic valve disorder, unspecified: Secondary | ICD-10-CM

## 2014-02-02 LAB — POCT INR: INR: 3.1

## 2014-02-16 ENCOUNTER — Telehealth: Payer: Self-pay | Admitting: *Deleted

## 2014-02-16 NOTE — Telephone Encounter (Signed)
lmtcb to inform pt of device check on 6/22 at 11 am with device clinic.

## 2014-02-23 ENCOUNTER — Ambulatory Visit (INDEPENDENT_AMBULATORY_CARE_PROVIDER_SITE_OTHER): Payer: Medicare Other

## 2014-02-23 DIAGNOSIS — Z954 Presence of other heart-valve replacement: Secondary | ICD-10-CM

## 2014-02-23 DIAGNOSIS — I352 Nonrheumatic aortic (valve) stenosis with insufficiency: Secondary | ICD-10-CM

## 2014-02-23 DIAGNOSIS — I4891 Unspecified atrial fibrillation: Secondary | ICD-10-CM

## 2014-02-23 DIAGNOSIS — Z5181 Encounter for therapeutic drug level monitoring: Secondary | ICD-10-CM

## 2014-02-23 DIAGNOSIS — I359 Nonrheumatic aortic valve disorder, unspecified: Secondary | ICD-10-CM

## 2014-02-23 LAB — POCT INR: INR: 2.4

## 2014-02-24 ENCOUNTER — Encounter: Payer: Self-pay | Admitting: Family

## 2014-02-24 ENCOUNTER — Ambulatory Visit (INDEPENDENT_AMBULATORY_CARE_PROVIDER_SITE_OTHER): Payer: Medicare Other | Admitting: Family

## 2014-02-24 VITALS — BP 171/110 | HR 56 | Temp 97.8°F | Resp 18 | Ht 62.0 in | Wt 190.1 lb

## 2014-02-24 DIAGNOSIS — L559 Sunburn, unspecified: Secondary | ICD-10-CM | POA: Insufficient documentation

## 2014-02-24 DIAGNOSIS — I10 Essential (primary) hypertension: Secondary | ICD-10-CM

## 2014-02-24 NOTE — Progress Notes (Signed)
Pre visit review using our clinic review tool, if applicable. No additional management support is needed unless otherwise documented below in the visit note. 

## 2014-02-24 NOTE — Assessment & Plan Note (Signed)
Continue aloe, discussed avoidance of sun exposure. petchiae are likely secondary to coumadin therapy. INR 2.8 yesterday.  Advised pt to call if she develops fever, increased pain, swelling.   Pt appears euvolemic today.  ? OA as cause for mild right knee swelling- monitor.

## 2014-02-24 NOTE — Assessment & Plan Note (Signed)
Pt would only allow forearm BP check.  Unable to auscultate- SBP by palpation 110.  Automated cuff read 171/110 but I question the validity of this. Continue current meds for BP.   BP Readings from Last 3 Encounters:  02/24/14 171/110  01/13/14 142/70  12/12/13 144/89

## 2014-02-24 NOTE — Patient Instructions (Addendum)
Please keep area clean and dry.  Call if you develop increased pain, redness/swelling.  Follow up in 3 months.

## 2014-02-24 NOTE — Progress Notes (Signed)
Subjective:    Patient ID: [name], female    DOB: [birth year], 73 y.o.   MRN: [id number]  HPI  [name] is a 73 yr old female who presents today with chief complaint of redness of bilateral shins.  Reports that she was out in the sun for 30 minutes and several hours later noted redness on her shins. She has been using aloe. Notes mild knee swelling.   Review of Systems    see HPI  Past Medical History  Diagnosis Date  . History of ventricular tachycardia   . Peripheral edema   . Aortic insufficiency     Moderate 2012  . History of gout   . Exogenous obesity   . History of CHF (congestive heart failure)     EF 45% 2012, nonischemic (Normal coronaries 2002)  . 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 Hypertension   . 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 ICD (implantable cardiac defibrillator) in place 2012  . DOE (dyspnea on exertion)   . Stroke 2000  . Renal insufficiency   . Anxiety   . 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 Anemia   . Presence of IVC filter 2000  . Osteopenia 09/01/2013  . Coronary artery disease   . Myocardial infarction 2000  . Pacemaker   . CHF (congestive heart failure)     History   Social History  . Marital Status: Divorced    Spouse Name: N/A    Number of Children: N/A  . Years of Education: N/A   Occupational History  . Not on file.   Social History Main Topics  . Smoking status: Never Smoker   . Smokeless tobacco: Never Used  . Alcohol Use: No  . Drug Use: No  . Sexual Activity: No   Other Topics Concern  . Not on file   Social History Narrative   Lives with daughter.      Past Surgical History  Procedure Laterality Date  . Cardiac defibrillator placement    . Cataract extraction    . Embolectomy  02/15/2012    Procedure: EMBOLECTOMY;  Surgeon: [name], MD;  Location: [facility] OR;  Service:  Vascular;  Laterality: Left;  left popliteal embolectomy with drain placement  . Insert / replace / remove pacemaker    . Eye surgery  2012    removal of cataracts bilaterally  . Cardiac catheterization    . Tonsillectomy    . Aortic valve replacement  04/12/2012    Procedure: AORTIC VALVE REPLACEMENT (AVR);  Surgeon: Delight Ovens, MD;  Location: [facility] OR;  Service: Open Heart Surgery;  Laterality: N/A;  with nitric oxide  . Thoracic aortic aneurysm repair  04/12/2012    Procedure: THORACIC ASCENDING ANEURYSM REPAIR (AAA);  Surgeon: Delight Ovens, MD;  Location: [facility] OR;  Service: Open Heart Surgery;  Laterality: N/A;  . Clot removal  5.2013    blood clot removed from the back of the left leg  . Aortic valve surgery  7.2013  . Coronary artery bypass graft  2014  . Cardiac valve replacement      Family History  Problem Relation Age of Onset  . Hypertension Father   . Cancer Father   . Coronary artery disease Father 63  . Cancer Son     pancreas, spine    Allergies  Allergen Reactions  .  Adhesive [Tape] Itching, Rash and Other (See Comments)    Redness and skin breaks down  . Latex Rash  . Neomycin-Bacitracin Zn-Polymyx Rash    Current Outpatient Prescriptions on File Prior to Visit  Medication Sig Dispense Refill  . allopurinol (ZYLOPRIM) 100 MG tablet Take 1 tablet (100 mg total) by mouth 2 (two) times daily.  60 tablet  5  . amLODipine (NORVASC) 10 MG tablet Take 0.5 tablets (5 mg total) by mouth daily.  30 tablet  5  . aspirin EC 81 MG tablet Take 1 tablet (81 mg total) by mouth daily.  90 tablet  3  . Biotin 5000 MCG CAPS Take 1 capsule (5,000 mcg total) by mouth daily.  30 capsule  6  . Bisacodyl (DULCOLAX PO) Take 1 tablet by mouth daily as needed (for constipation).      . bisoprolol (ZEBETA) 10 MG tablet Take 1 tablet (10 mg total) by mouth 2 (two) times daily.  60 tablet  6  . Calcium Carb-Cholecalciferol (CALCIUM-VITAMIN D) 600-400 MG-UNIT TABS Take 1 tablet by  mouth daily.      . furosemide (LASIX) 40 MG tablet TAKE 1 TABLET TWICE A DAY  60 tablet  5  . potassium chloride SA (K-DUR,KLOR-CON) 20 MEQ tablet Take 1 tablet (20 mEq total) by mouth 3 (three) times daily.  90 tablet  11  . rosuvastatin (CRESTOR) 5 MG tablet Take 1 tablet (5 mg total) by mouth daily.  30 tablet  5  . senna-docusate (SENOKOT S) 8.6-50 MG per tablet Take 2 tablets by mouth at bedtime. For constipation, also available OTC  60 tablet  3  . warfarin (COUMADIN) 5 MG tablet Take as directed by coumadin clinic  30 tablet  3   No current facility-administered medications on file prior to visit.    BP 171/110  Pulse 56  Temp(Src) 97.8 F (36.6 C)  Resp 18  Ht 5\' 2"  (1.575 m)  Wt 190 lb 1.3 oz (86.22 kg)  BMI 34.76 kg/m2  SpO2 98%    Objective:   Physical Exam  Constitutional: She is oriented to person, place, and time. She appears well-developed and well-nourished. No distress.  Cardiovascular: Normal rate and regular rhythm.   No murmur heard. Pulmonary/Chest: Effort normal and breath sounds normal. No respiratory distress. She has no wheezes. She has no rales. She exhibits no tenderness.  Musculoskeletal:  Mild swelling of the right knee 1+ bilateral LE edema.    Neurological: She is alert and oriented to person, place, and time.  Skin:  Bilateral shin erythema is noted with some associated petechia  Psychiatric: She has a normal mood and affect. Her behavior is normal. Judgment and thought content normal.          Assessment & Plan:

## 2014-02-28 NOTE — Telephone Encounter (Signed)
confirmed

## 2014-03-07 ENCOUNTER — Encounter: Payer: Self-pay | Admitting: Internal Medicine

## 2014-03-20 ENCOUNTER — Ambulatory Visit (INDEPENDENT_AMBULATORY_CARE_PROVIDER_SITE_OTHER): Payer: Medicare Other

## 2014-03-20 ENCOUNTER — Ambulatory Visit (INDEPENDENT_AMBULATORY_CARE_PROVIDER_SITE_OTHER): Payer: Medicare Other | Admitting: *Deleted

## 2014-03-20 DIAGNOSIS — I5022 Chronic systolic (congestive) heart failure: Secondary | ICD-10-CM

## 2014-03-20 DIAGNOSIS — I4891 Unspecified atrial fibrillation: Secondary | ICD-10-CM

## 2014-03-20 DIAGNOSIS — Z954 Presence of other heart-valve replacement: Secondary | ICD-10-CM

## 2014-03-20 DIAGNOSIS — Z5181 Encounter for therapeutic drug level monitoring: Secondary | ICD-10-CM

## 2014-03-20 DIAGNOSIS — I469 Cardiac arrest, cause unspecified: Secondary | ICD-10-CM

## 2014-03-20 DIAGNOSIS — I5043 Acute on chronic combined systolic (congestive) and diastolic (congestive) heart failure: Secondary | ICD-10-CM

## 2014-03-20 DIAGNOSIS — I352 Nonrheumatic aortic (valve) stenosis with insufficiency: Secondary | ICD-10-CM

## 2014-03-20 DIAGNOSIS — I359 Nonrheumatic aortic valve disorder, unspecified: Secondary | ICD-10-CM

## 2014-03-20 DIAGNOSIS — I509 Heart failure, unspecified: Secondary | ICD-10-CM

## 2014-03-20 LAB — MDC_IDC_ENUM_SESS_TYPE_INCLINIC
Battery Voltage: 3.02 V
Brady Statistic RV Percent Paced: 12.05 %
Date Time Interrogation Session: 20150622113334
HIGH POWER IMPEDANCE MEASURED VALUE: 43 Ohm
HighPow Impedance: 59 Ohm
Lead Channel Pacing Threshold Pulse Width: 0.8 ms
Lead Channel Setting Pacing Pulse Width: 0.8 ms
Lead Channel Setting Sensing Sensitivity: 0.3 mV
MDC IDC MSMT LEADCHNL RV IMPEDANCE VALUE: 800 Ohm
MDC IDC MSMT LEADCHNL RV PACING THRESHOLD AMPLITUDE: 1 V
MDC IDC MSMT LEADCHNL RV SENSING INTR AMPL: 10.9824
MDC IDC SET LEADCHNL RV PACING AMPLITUDE: 2.5 V
MDC IDC SET ZONE DETECTION INTERVAL: 290 ms
MDC IDC SET ZONE DETECTION INTERVAL: 450 ms
Zone Setting Detection Interval: 330 ms

## 2014-03-20 LAB — POCT INR: INR: 2.4

## 2014-03-20 NOTE — Progress Notes (Signed)
ICD check in clinic. Normal device function. Threshold and sensing consistent with previous device measurements. SIC=0. Impedance trends stable over time. No evidence of any ventricular arrhythmias. Histogram distribution appropriate for patient and level of activity. No changes made this session. Device programmed at appropriate safety margins. Device programmed to optimize intrinsic conduction. Batt voltage 3.02V (ERI 2.62V). Plan to follow up with the Device Clinic on 9-21 and with SK in 11-2014. Patient education completed including shock plan. Alert tones demonstrated for patient-pt unable to hear.

## 2014-03-27 ENCOUNTER — Ambulatory Visit (INDEPENDENT_AMBULATORY_CARE_PROVIDER_SITE_OTHER): Payer: Medicare Other | Admitting: *Deleted

## 2014-03-27 DIAGNOSIS — Z111 Encounter for screening for respiratory tuberculosis: Secondary | ICD-10-CM

## 2014-03-29 ENCOUNTER — Encounter: Payer: Self-pay | Admitting: *Deleted

## 2014-03-29 LAB — TB SKIN TEST: TB Skin Test: NEGATIVE

## 2014-04-17 ENCOUNTER — Ambulatory Visit (INDEPENDENT_AMBULATORY_CARE_PROVIDER_SITE_OTHER): Payer: Medicare Other | Admitting: *Deleted

## 2014-04-17 DIAGNOSIS — Z5181 Encounter for therapeutic drug level monitoring: Secondary | ICD-10-CM

## 2014-04-17 DIAGNOSIS — I4891 Unspecified atrial fibrillation: Secondary | ICD-10-CM

## 2014-04-17 DIAGNOSIS — I352 Nonrheumatic aortic (valve) stenosis with insufficiency: Secondary | ICD-10-CM

## 2014-04-17 DIAGNOSIS — I359 Nonrheumatic aortic valve disorder, unspecified: Secondary | ICD-10-CM

## 2014-04-17 DIAGNOSIS — Z954 Presence of other heart-valve replacement: Secondary | ICD-10-CM

## 2014-04-17 LAB — POCT INR: INR: 2

## 2014-05-04 ENCOUNTER — Encounter: Payer: Self-pay | Admitting: Internal Medicine

## 2014-05-08 ENCOUNTER — Other Ambulatory Visit: Payer: Self-pay | Admitting: *Deleted

## 2014-05-08 ENCOUNTER — Other Ambulatory Visit (INDEPENDENT_AMBULATORY_CARE_PROVIDER_SITE_OTHER): Payer: Medicare Other

## 2014-05-08 DIAGNOSIS — Z79899 Other long term (current) drug therapy: Secondary | ICD-10-CM

## 2014-05-08 DIAGNOSIS — E785 Hyperlipidemia, unspecified: Secondary | ICD-10-CM

## 2014-05-08 LAB — BASIC METABOLIC PANEL
BUN: 32 mg/dL — ABNORMAL HIGH (ref 6–23)
CO2: 29 meq/L (ref 19–32)
Calcium: 9 mg/dL (ref 8.4–10.5)
Chloride: 103 mEq/L (ref 96–112)
Creatinine, Ser: 1.4 mg/dL — ABNORMAL HIGH (ref 0.4–1.2)
GFR: 39.26 mL/min — AB (ref >60.00)
GLUCOSE: 90 mg/dL (ref 70–99)
POTASSIUM: 3.6 meq/L (ref 3.5–5.1)
SODIUM: 142 meq/L (ref 135–145)

## 2014-05-08 LAB — LIPID PANEL
Cholesterol: 180 mg/dL (ref 0–200)
HDL: 65.3 mg/dL (ref >39.00)
LDL CALC: 93 mg/dL (ref 0–99)
NonHDL: 114.7
Total CHOL/HDL Ratio: 3
Triglycerides: 107 mg/dL (ref 0.0–149.0)
VLDL: 21.4 mg/dL (ref 0.0–40.0)

## 2014-05-08 LAB — URIC ACID: Uric Acid, Serum: 7.2 mg/dL — ABNORMAL HIGH (ref 2.4–7.0)

## 2014-05-08 LAB — HEPATIC FUNCTION PANEL
ALT: 13 U/L (ref 0–35)
AST: 24 U/L (ref 0–37)
Albumin: 3.9 g/dL (ref 3.5–5.2)
Alkaline Phosphatase: 71 U/L (ref 39–117)
BILIRUBIN TOTAL: 0.6 mg/dL (ref 0.2–1.2)
Bilirubin, Direct: 0.1 mg/dL (ref 0.0–0.3)
Total Protein: 7.5 g/dL (ref 6.0–8.3)

## 2014-05-08 LAB — CBC WITH DIFFERENTIAL/PLATELET
BASOS ABS: 0 10*3/uL (ref 0.0–0.1)
Basophils Relative: 0.5 % (ref 0.0–3.0)
Eosinophils Absolute: 0.3 10*3/uL (ref 0.0–0.7)
Eosinophils Relative: 4.6 % (ref 0.0–5.0)
HCT: 43.1 % (ref 36.0–46.0)
Hemoglobin: 14.1 g/dL (ref 12.0–15.0)
LYMPHS PCT: 28.6 % (ref 12.0–46.0)
Lymphs Abs: 1.6 10*3/uL (ref 0.7–4.0)
MCHC: 32.8 g/dL (ref 30.0–36.0)
MCV: 94.3 fl (ref 78.0–100.0)
MONOS PCT: 11.4 % (ref 3.0–12.0)
Monocytes Absolute: 0.6 10*3/uL (ref 0.1–1.0)
NEUTROS PCT: 54.9 % (ref 43.0–77.0)
Neutro Abs: 3.1 10*3/uL (ref 1.4–7.7)
PLATELETS: 189 10*3/uL (ref 150.0–400.0)
RBC: 4.57 Mil/uL (ref 3.87–5.11)
RDW: 14.5 % (ref 11.5–15.5)
WBC: 5.6 10*3/uL (ref 4.0–10.5)

## 2014-05-08 NOTE — Progress Notes (Signed)
Quick Note:  Please make copy of labs for patient visit. ______ 

## 2014-05-15 ENCOUNTER — Encounter: Payer: Self-pay | Admitting: Cardiology

## 2014-05-15 ENCOUNTER — Ambulatory Visit (INDEPENDENT_AMBULATORY_CARE_PROVIDER_SITE_OTHER): Payer: Medicare Other

## 2014-05-15 ENCOUNTER — Ambulatory Visit (INDEPENDENT_AMBULATORY_CARE_PROVIDER_SITE_OTHER): Payer: Medicare Other | Admitting: Cardiology

## 2014-05-15 VITALS — BP 169/75 | HR 98 | Ht 61.5 in | Wt 196.0 lb

## 2014-05-15 DIAGNOSIS — I509 Heart failure, unspecified: Secondary | ICD-10-CM

## 2014-05-15 DIAGNOSIS — I10 Essential (primary) hypertension: Secondary | ICD-10-CM

## 2014-05-15 DIAGNOSIS — Z953 Presence of xenogenic heart valve: Secondary | ICD-10-CM

## 2014-05-15 DIAGNOSIS — I5023 Acute on chronic systolic (congestive) heart failure: Secondary | ICD-10-CM

## 2014-05-15 DIAGNOSIS — E785 Hyperlipidemia, unspecified: Secondary | ICD-10-CM

## 2014-05-15 DIAGNOSIS — Z5181 Encounter for therapeutic drug level monitoring: Secondary | ICD-10-CM

## 2014-05-15 DIAGNOSIS — I5022 Chronic systolic (congestive) heart failure: Secondary | ICD-10-CM

## 2014-05-15 DIAGNOSIS — Z954 Presence of other heart-valve replacement: Secondary | ICD-10-CM

## 2014-05-15 DIAGNOSIS — Z952 Presence of prosthetic heart valve: Secondary | ICD-10-CM

## 2014-05-15 DIAGNOSIS — I4891 Unspecified atrial fibrillation: Secondary | ICD-10-CM

## 2014-05-15 DIAGNOSIS — I70219 Atherosclerosis of native arteries of extremities with intermittent claudication, unspecified extremity: Secondary | ICD-10-CM

## 2014-05-15 DIAGNOSIS — I469 Cardiac arrest, cause unspecified: Secondary | ICD-10-CM

## 2014-05-15 DIAGNOSIS — I359 Nonrheumatic aortic valve disorder, unspecified: Secondary | ICD-10-CM

## 2014-05-15 DIAGNOSIS — I482 Chronic atrial fibrillation, unspecified: Secondary | ICD-10-CM

## 2014-05-15 DIAGNOSIS — I352 Nonrheumatic aortic (valve) stenosis with insufficiency: Secondary | ICD-10-CM

## 2014-05-15 LAB — POCT INR: INR: 1.9

## 2014-05-15 NOTE — Patient Instructions (Signed)
Your physician recommends that you continue on your current medications as directed. Please refer to the Current Medication list given to you today.  Your physician recommends that you return for lab work in: 4 months at next office visit. Reminder: Do not eat or drink after midnight the night before labs are drawn.  Your physician recommends that you schedule a follow-up appointment in: 4 months

## 2014-05-15 NOTE — Progress Notes (Signed)
Heather AmmonsJeritta R [name] Date of Birth:  09/15/1941 [facility] [address] [place], [place]  [id number] [phone]        Fax   [phone]   History of Present Illness: This pleasant 73 year old woman is seen for a scheduled four-month office visit. 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 Tyrone SageGerhardt. 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 She also has a dilated left ventricle with marked decrease in left ventricular systolic function. She is now on bisoprolol in place of carvedilol. Her symptoms of congestive heart failure have improved significantly since surgery. She is no longer having any ankle edema or significant orthopnea.  She has a good appetite. She now lives with her daughter [place]. She remains in chronic atrial fibrillation and is now on Coumadin. Clinically she is much improved since her surgery. She is getting ready to go to the beach with her daughter in September. In February 2014 she was hospitalized with respiratory problems and exacerbation of CHF.  Her last echocardiogram was on 11/05/13 and showed an ejection fraction of 45-50%.  The bioprosthetic aortic valve was functioning normally.   Current Outpatient Prescriptions  Medication Sig Dispense Refill  . allopurinol (ZYLOPRIM) 100 MG tablet Take 1 tablet (100 mg total) by mouth 2 (two) times daily.  60 tablet  5  . amLODipine (NORVASC) 10 MG tablet Take 0.5 tablets (5 mg total) by mouth daily.  30 tablet  5  . aspirin EC 81 MG tablet Take 1 tablet (81 mg total) by mouth daily.  90 tablet  3  . Biotin 5000 MCG CAPS Take 1 capsule (5,000 mcg total) by mouth daily.  30 capsule  6  . Bisacodyl (DULCOLAX PO) Take 1 tablet by mouth daily as needed (for constipation).      . bisoprolol (ZEBETA) 10 MG tablet Take 1 tablet (10 mg total) by mouth 2 (two) times daily.  60 tablet  6  .  Calcium Carb-Cholecalciferol (CALCIUM-VITAMIN D) 600-400 MG-UNIT TABS Take 1 tablet by mouth daily.      . furosemide (LASIX) 40 MG tablet TAKE 1 TABLET TWICE A DAY  60 tablet  5  . potassium chloride SA (K-DUR,KLOR-CON) 20 MEQ tablet Take 1 tablet (20 mEq total) by mouth 3 (three) times daily.  90 tablet  11  . rosuvastatin (CRESTOR) 5 MG tablet Take 1 tablet (5 mg total) by mouth daily.  30 tablet  5  . senna-docusate (SENOKOT S) 8.6-50 MG per tablet Take 2 tablets by mouth at bedtime. For constipation, also available OTC  60 tablet  3  . warfarin (COUMADIN) 5 MG tablet Take as directed by coumadin clinic  30 tablet  3   No current facility-administered medications for this visit.    Allergies  Allergen Reactions  . Adhesive [Tape] Itching, Rash and Other (See Comments)    Redness and skin breaks down  . Latex Rash  . Neomycin-Bacitracin Zn-Polymyx Rash    Patient Active Problem List   Diagnosis Date Noted  . Aortic insufficiency and aortic stenosis 01/01/2011    Priority: High  . Atrial fibrillation 01/02/2009    Priority: High  . Sunburn 02/24/2014  . Encounter for therapeutic drug monitoring 11/21/2013  . S/P implantation of automatic cardioverter/defibrillator (AICD) 11/21/2013  . Hypertensive urgency, malignant 11/08/2013  . Acute bronchitis 11/08/2013  . Acute CHF 11/04/2013  .  Acute on chronic systolic CHF (congestive heart failure) 11/04/2013  . CKD (chronic kidney disease), stage III 11/04/2013  . A-fib 11/04/2013  . Acute respiratory failure 11/04/2013  . Gout 09/19/2013  . Osteopenia 09/01/2013  . Osteopenia 09/01/2013  . Chronic constipation 07/11/2013  . Frozen shoulder 12/01/2012  . Annual physical exam 07/08/2012  . Heart valve replaced by other means 04/27/2012  . Atherosclerosis of native arteries of the extremities with intermittent claudication 03/03/2012  . Anemia 02/17/2012  . Lower limb ischemia 02/14/2012  . Chronic systolic congestive heart  failure, NYHA class 3 02/14/2012  . Acute on chronic kidney disease, stage 3 02/14/2012  . Cardiac arrest 11/04/2011  . Implantable cardiac defibrillator single-chamber-Medtronic 11/04/2011  . Alopecia 11/04/2011  . ALKALINE PHOSPHATASE, ELEVATED 12/12/2010  . COMBINED HEART FAILURE, ACUTE ON CHRONIC 11/25/2010  . BACK PAIN, LUMBAR 12/19/2009  . Memory loss 09/05/2009  . VENOUS INSUFFICIENCY 01/19/2009  . HYPERLIPIDEMIA 01/02/2009  . GOUT 01/02/2009  . HYPERTENSION 01/02/2009    History  Smoking status  . Never Smoker   Smokeless tobacco  . Never Used    History  Alcohol Use No    Family History  Problem Relation Age of Onset  . Hypertension Father   . Cancer Father   . Coronary artery disease Father 44  . Cancer Son     pancreas, spine    Review of Systems: Constitutional: no fever chills diaphoresis or fatigue or change in weight.  Head and neck: no hearing loss, no epistaxis, no photophobia or visual disturbance. Respiratory: No cough, shortness of breath or wheezing. Cardiovascular: No chest pain peripheral edema, palpitations. Gastrointestinal: No abdominal distention, no abdominal pain, no change in bowel habits hematochezia or melena. Genitourinary: No dysuria, no frequency, no urgency, no nocturia. Musculoskeletal:No arthralgias, no back pain, no gait disturbance or myalgias. Neurological: No dizziness, no headaches, no numbness, no seizures, no syncope, no weakness, no tremors. Hematologic: No lymphadenopathy, no easy bruising. Psychiatric: No confusion, no hallucinations, no sleep disturbance.    Physical Exam: Filed Vitals:   05/15/14 1151  BP: 169/75  Pulse: 98   the general appearance reveals a well-developed well-nourished elderly woman in no distress.The head and neck exam reveals pupils equal and reactive.  Extraocular movements are full.  There is no scleral icterus.  The mouth and pharynx are normal.  The neck is supple.  The carotids reveal no  bruits.  The jugular venous pressure is normal.  The  thyroid is not enlarged.  There is no lymphadenopathy.  The chest is clear to percussion and auscultation.  There are no rales or rhonchi.  Expansion of the chest is symmetrical.  The precordium is quiet.  The pulse is irregularly irregular  The first heart sound is normal.  The second heart sound is physiologically split.  There is grade 2/6 systolic ejection murmur across the prosthetic aortic valve.  No diastolic murmur heard.  No gallop.  No rub.  There is no abnormal lift or heave.  The abdomen is soft and nontender.  The bowel sounds are normal.  The liver and spleen are not enlarged.  There are no abdominal masses.  There are no abdominal bruits.  Extremities reveal good pedal pulses.  There is no phlebitis or edema.  There is no cyanosis or clubbing.  Strength is normal and symmetrical in all extremities.  There is no lateralizing weakness.  There are no sensory deficits.  The skin is warm and dry.  There is no rash.  Assessment / Plan: 1. permanent atrial fibrillation 2. ICD in place.  Remote history of cardiac arrest secondary to ventricular tachycardia. 3. status post replacement of native aortic valve and ascending root and placement of a bioprosthetic aortic valve, doing well postoperatively. 4.  Essential hypertension without heart failure. 5. Hyperlipidemia 6. chronic kidney disease stage III  Plan: Continue same medication.  Recheck in 4 months for office visit EKG lipid panel hepatic function panel and basal metabolic panel.

## 2014-05-15 NOTE — Assessment & Plan Note (Signed)
Her symptoms of shortness of breath have improved significantly since her aortic valve replacement.  She is not having the dizziness or syncope.  No chest pain.  Only mild exertional dyspnea.

## 2014-05-15 NOTE — Assessment & Plan Note (Signed)
Patient has not been aware of any shocks from her defibrillator.

## 2014-05-15 NOTE — Assessment & Plan Note (Signed)
The pedal pulses are very difficult to feel.  Her feet are warm.

## 2014-05-15 NOTE — Assessment & Plan Note (Signed)
The patient is in permanent atrial fibrillation.  She is on long-term Coumadin.  She has not been having any TIA or stroke symptoms.

## 2014-06-12 ENCOUNTER — Encounter: Payer: Self-pay | Admitting: Family

## 2014-06-12 ENCOUNTER — Ambulatory Visit (INDEPENDENT_AMBULATORY_CARE_PROVIDER_SITE_OTHER): Payer: Medicare Other | Admitting: Family

## 2014-06-12 ENCOUNTER — Ambulatory Visit (INDEPENDENT_AMBULATORY_CARE_PROVIDER_SITE_OTHER): Payer: Medicare Other

## 2014-06-12 VITALS — BP 173/101 | HR 60 | Temp 97.8°F | Resp 18 | Ht 62.0 in | Wt 196.0 lb

## 2014-06-12 DIAGNOSIS — Z5181 Encounter for therapeutic drug level monitoring: Secondary | ICD-10-CM

## 2014-06-12 DIAGNOSIS — I10 Essential (primary) hypertension: Secondary | ICD-10-CM

## 2014-06-12 DIAGNOSIS — Z954 Presence of other heart-valve replacement: Secondary | ICD-10-CM

## 2014-06-12 DIAGNOSIS — I352 Nonrheumatic aortic (valve) stenosis with insufficiency: Secondary | ICD-10-CM

## 2014-06-12 DIAGNOSIS — I359 Nonrheumatic aortic valve disorder, unspecified: Secondary | ICD-10-CM

## 2014-06-12 DIAGNOSIS — M545 Low back pain, unspecified: Secondary | ICD-10-CM

## 2014-06-12 DIAGNOSIS — I4891 Unspecified atrial fibrillation: Secondary | ICD-10-CM

## 2014-06-12 LAB — POCT INR: INR: 2

## 2014-06-12 MED ORDER — LISINOPRIL 10 MG PO TABS: 10.0000 mg | ORAL_TABLET | Freq: Every day | ORAL | Status: DC

## 2014-06-12 MED ORDER — METHYLPREDNISOLONE 4 MG PO KIT: PACK | ORAL | Status: DC

## 2014-06-12 NOTE — Progress Notes (Signed)
Pre visit review using our clinic review tool, if applicable. No additional management support is needed unless otherwise documented below in the visit note. 

## 2014-06-12 NOTE — Progress Notes (Signed)
Subjective:    Patient ID: [name], female    DOB: [birth year], 73 y.o.   MRN: [id number]  HPI  [name] is a 73 yr old female who presents today with chief complaint of low back pain. Pain started Wednesday. Thursday AM back hurt "so much." had trouble "getting up." Has been "laying around." Pain is located bilateral lower back.  Pain is non radiating.  Pain is improved by tylenol.    BP Readings from Last 3 Encounters:  06/12/14 173/101  05/15/14 169/75  02/24/14 171/110      Review of Systems See HPI  Past Medical History  Diagnosis Date  . History of ventricular tachycardia   . Peripheral edema   . Aortic insufficiency     Moderate 2012  . History of gout   . Exogenous obesity   . History of CHF (congestive heart failure)     EF 45% 2012, nonischemic (Normal coronaries 2002)  . 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 Hypertension   . 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 ICD (implantable cardiac defibrillator) in place 2012  . DOE (dyspnea on exertion)   . Stroke 2000  . Renal insufficiency   . Anxiety   . 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 Anemia   . Presence of IVC filter 2000  . Osteopenia 09/01/2013  . Coronary artery disease   . Myocardial infarction 2000  . Pacemaker   . CHF (congestive heart failure)     History   Social History  . Marital Status: Divorced    Spouse Name: N/A    Number of Children: N/A  . Years of Education: N/A   Occupational History  . Not on file.   Social History Main Topics  . Smoking status: Never Smoker   . Smokeless tobacco: Never Used  . Alcohol Use: No  . Drug Use: No  . Sexual Activity: No   Other Topics Concern  . Not on file   Social History Narrative   Lives with daughter.      Past Surgical History  Procedure Laterality Date  . Cardiac defibrillator placement    . Cataract  extraction    . Embolectomy  02/15/2012    Procedure: EMBOLECTOMY;  Surgeon: [name], MD;  Location: [facility] OR;  Service: Vascular;  Laterality: Left;  left popliteal embolectomy with drain placement  . Insert / replace / remove pacemaker    . Eye surgery  2012    removal of cataracts bilaterally  . Cardiac catheterization    . Tonsillectomy    . Aortic valve replacement  04/12/2012    Procedure: AORTIC VALVE REPLACEMENT (AVR);  Surgeon: Delight Ovens, MD;  Location: [facility] OR;  Service: Open Heart Surgery;  Laterality: N/A;  with nitric oxide  . Thoracic aortic aneurysm repair  04/12/2012    Procedure: THORACIC ASCENDING ANEURYSM REPAIR (AAA);  Surgeon: Delight Ovens, MD;  Location: [facility] OR;  Service: Open Heart Surgery;  Laterality: N/A;  . Clot removal  5.2013    blood clot removed from the back of the left leg  . Aortic valve surgery  7.2013  . Coronary artery bypass graft  2014  . Cardiac valve replacement      Family History  Problem Relation Age of Onset  . Hypertension Father   . Cancer Father   . Coronary artery disease  Father 35  . Cancer Son     pancreas, spine    Allergies  Allergen Reactions  . Adhesive [Tape] Itching, Rash and Other (See Comments)    Redness and skin breaks down  . Latex Rash  . Neomycin-Bacitracin Zn-Polymyx Rash    Current Outpatient Prescriptions on File Prior to Visit  Medication Sig Dispense Refill  . allopurinol (ZYLOPRIM) 100 MG tablet Take 1 tablet (100 mg total) by mouth 2 (two) times daily.  60 tablet  5  . amLODipine (NORVASC) 10 MG tablet Take 0.5 tablets (5 mg total) by mouth daily.  30 tablet  5  . aspirin EC 81 MG tablet Take 1 tablet (81 mg total) by mouth daily.  90 tablet  3  . Biotin 5000 MCG CAPS Take 1 capsule (5,000 mcg total) by mouth daily.  30 capsule  6  . Bisacodyl (DULCOLAX PO) Take 1 tablet by mouth daily as needed (for constipation).      . bisoprolol (ZEBETA) 10 MG tablet Take 1 tablet (10 mg total) by  mouth 2 (two) times daily.  60 tablet  6  . Calcium Carb-Cholecalciferol (CALCIUM-VITAMIN D) 600-400 MG-UNIT TABS Take 1 tablet by mouth daily.      . furosemide (LASIX) 40 MG tablet TAKE 1 TABLET TWICE A DAY  60 tablet  5  . potassium chloride SA (K-DUR,KLOR-CON) 20 MEQ tablet Take 1 tablet (20 mEq total) by mouth 3 (three) times daily.  90 tablet  11  . rosuvastatin (CRESTOR) 5 MG tablet Take 1 tablet (5 mg total) by mouth daily.  30 tablet  5  . senna-docusate (SENOKOT S) 8.6-50 MG per tablet Take 2 tablets by mouth at bedtime. For constipation, also available OTC  60 tablet  3  . warfarin (COUMADIN) 5 MG tablet Take as directed by coumadin clinic  30 tablet  3   No current facility-administered medications on file prior to visit.    BP 173/101  Pulse 60  Temp(Src) 97.8 F (36.6 C) (Oral)  Resp 18  Ht  (1.575 m)  Wt 196 lb (88.905 kg)  BMI 35.84 kg/m2  SpO2 97%       Objective:   Physical Exam  Constitutional: She is oriented to person, place, and time. She appears well-developed and well-nourished. No distress.  HENT:  Head: Normocephalic and atraumatic.  Cardiovascular: Normal rate and regular rhythm.   No murmur heard. Pulmonary/Chest: Effort normal and breath sounds normal. No respiratory distress. She has no wheezes. She has no rales. She exhibits no tenderness.  Musculoskeletal: She exhibits no edema.       Cervical back: She exhibits no tenderness.       Thoracic back: She exhibits no tenderness.       Lumbar back: She exhibits no tenderness.  Neurological: She is alert and oriented to person, place, and time.  Skin: Skin is warm and dry.  Psychiatric: She has a normal mood and affect. Her behavior is normal. Judgment and thought content normal.          Assessment & Plan:

## 2014-06-12 NOTE — Patient Instructions (Signed)
Start lisinopril 10mg  once daily for blood pressure. Continue tylenol as needed for back pain. If pain is not improved in 1-2 days, start medrol dose pak. Follow up in 3-4 weeks, as soon as you return from your trip.

## 2014-06-13 DIAGNOSIS — M545 Low back pain, unspecified: Secondary | ICD-10-CM | POA: Insufficient documentation

## 2014-06-13 NOTE — Assessment & Plan Note (Signed)
Uncontrolled. Add lisinopril for BP control, follow up for bp check and bmet.

## 2014-06-13 NOTE — Assessment & Plan Note (Signed)
Trial of medrol dose pak if symptoms do not continue to improve.  Avoid nsaids due to renal function and muscle relaxers due to age.

## 2014-06-19 ENCOUNTER — Emergency Department (HOSPITAL_BASED_OUTPATIENT_CLINIC_OR_DEPARTMENT_OTHER): Payer: Medicare Other

## 2014-06-19 ENCOUNTER — Encounter (HOSPITAL_BASED_OUTPATIENT_CLINIC_OR_DEPARTMENT_OTHER): Payer: Self-pay | Admitting: Emergency Medicine

## 2014-06-19 ENCOUNTER — Inpatient Hospital Stay (HOSPITAL_BASED_OUTPATIENT_CLINIC_OR_DEPARTMENT_OTHER)
Admission: EM | Admit: 2014-06-19 | Discharge: 2014-06-23 | DRG: 292 | Disposition: A | Discharge status: Home / Self Care | Payer: Medicare Other | Attending: Cardiology | Admitting: Cardiology

## 2014-06-19 DIAGNOSIS — I447 Left bundle-branch block, unspecified: Secondary | ICD-10-CM | POA: Diagnosis present

## 2014-06-19 DIAGNOSIS — J40 Bronchitis, not specified as acute or chronic: Secondary | ICD-10-CM | POA: Diagnosis present

## 2014-06-19 DIAGNOSIS — Z952 Presence of prosthetic heart valve: Secondary | ICD-10-CM

## 2014-06-19 DIAGNOSIS — I428 Other cardiomyopathies: Secondary | ICD-10-CM | POA: Diagnosis present

## 2014-06-19 DIAGNOSIS — J9801 Acute bronchospasm: Secondary | ICD-10-CM | POA: Diagnosis present

## 2014-06-19 DIAGNOSIS — I482 Chronic atrial fibrillation, unspecified: Secondary | ICD-10-CM | POA: Diagnosis present

## 2014-06-19 DIAGNOSIS — M545 Low back pain, unspecified: Secondary | ICD-10-CM | POA: Diagnosis present

## 2014-06-19 DIAGNOSIS — Z7901 Long term (current) use of anticoagulants: Secondary | ICD-10-CM | POA: Diagnosis not present

## 2014-06-19 DIAGNOSIS — I129 Hypertensive chronic kidney disease with stage 1 through stage 4 chronic kidney disease, or unspecified chronic kidney disease: Secondary | ICD-10-CM | POA: Diagnosis present

## 2014-06-19 DIAGNOSIS — I5033 Acute on chronic diastolic (congestive) heart failure: Secondary | ICD-10-CM

## 2014-06-19 DIAGNOSIS — R0902 Hypoxemia: Secondary | ICD-10-CM

## 2014-06-19 DIAGNOSIS — I359 Nonrheumatic aortic valve disorder, unspecified: Secondary | ICD-10-CM | POA: Diagnosis present

## 2014-06-19 DIAGNOSIS — E785 Hyperlipidemia, unspecified: Secondary | ICD-10-CM | POA: Diagnosis present

## 2014-06-19 DIAGNOSIS — N183 Chronic kidney disease, stage 3 unspecified: Secondary | ICD-10-CM | POA: Diagnosis present

## 2014-06-19 DIAGNOSIS — M549 Dorsalgia, unspecified: Secondary | ICD-10-CM | POA: Diagnosis present

## 2014-06-19 DIAGNOSIS — Z95810 Presence of automatic (implantable) cardiac defibrillator: Secondary | ICD-10-CM | POA: Diagnosis present

## 2014-06-19 DIAGNOSIS — I509 Heart failure, unspecified: Secondary | ICD-10-CM | POA: Insufficient documentation

## 2014-06-19 DIAGNOSIS — J209 Acute bronchitis, unspecified: Secondary | ICD-10-CM | POA: Diagnosis present

## 2014-06-19 DIAGNOSIS — I4891 Unspecified atrial fibrillation: Secondary | ICD-10-CM | POA: Diagnosis present

## 2014-06-19 DIAGNOSIS — M109 Gout, unspecified: Secondary | ICD-10-CM | POA: Diagnosis present

## 2014-06-19 DIAGNOSIS — I5043 Acute on chronic combined systolic (congestive) and diastolic (congestive) heart failure: Secondary | ICD-10-CM

## 2014-06-19 HISTORY — DX: Acute embolism and thrombosis of unspecified deep veins of unspecified lower extremity: I82.409

## 2014-06-19 LAB — PROTIME-INR
INR: 1.95 — AB (ref 0.00–1.49)
Prothrombin Time: 22.2 seconds — ABNORMAL HIGH (ref 11.6–15.2)

## 2014-06-19 LAB — COMPREHENSIVE METABOLIC PANEL
ALBUMIN: 3.9 g/dL (ref 3.5–5.2)
ALK PHOS: 77 U/L (ref 39–117)
ALT: 14 U/L (ref 0–35)
AST: 23 U/L (ref 0–37)
Anion gap: 15 (ref 5–15)
BILIRUBIN TOTAL: 0.7 mg/dL (ref 0.3–1.2)
BUN: 24 mg/dL — ABNORMAL HIGH (ref 6–23)
CHLORIDE: 101 meq/L (ref 96–112)
CO2: 28 meq/L (ref 19–32)
CREATININE: 1.5 mg/dL — AB (ref 0.50–1.10)
Calcium: 9.6 mg/dL (ref 8.4–10.5)
GFR calc Af Amer: 39 mL/min — ABNORMAL LOW (ref >90)
GFR, EST NON AFRICAN AMERICAN: 34 mL/min — AB (ref >90)
Glucose, Bld: 140 mg/dL — ABNORMAL HIGH (ref 70–99)
Potassium: 3.6 mEq/L — ABNORMAL LOW (ref 3.7–5.3)
SODIUM: 144 meq/L (ref 137–147)
Total Protein: 8.3 g/dL (ref 6.0–8.3)

## 2014-06-19 LAB — CBC WITH DIFFERENTIAL/PLATELET
BASOS PCT: 0 % (ref 0–1)
Basophils Absolute: 0 10*3/uL (ref 0.0–0.1)
Eosinophils Absolute: 0.4 10*3/uL (ref 0.0–0.7)
Eosinophils Relative: 2 % (ref 0–5)
HEMATOCRIT: 40.3 % (ref 36.0–46.0)
Hemoglobin: 13.6 g/dL (ref 12.0–15.0)
LYMPHS PCT: 17 % (ref 12–46)
Lymphs Abs: 2.5 10*3/uL (ref 0.7–4.0)
MCH: 32.1 pg (ref 26.0–34.0)
MCHC: 33.7 g/dL (ref 30.0–36.0)
MCV: 95 fL (ref 78.0–100.0)
MONO ABS: 1.4 10*3/uL — AB (ref 0.1–1.0)
Monocytes Relative: 10 % (ref 3–12)
Neutro Abs: 10.1 10*3/uL — ABNORMAL HIGH (ref 1.7–7.7)
Neutrophils Relative %: 71 % (ref 43–77)
Platelets: 150 10*3/uL (ref 150–400)
RBC: 4.24 MIL/uL (ref 3.87–5.11)
RDW: 14.5 % (ref 11.5–15.5)
WBC: 14.3 10*3/uL — AB (ref 4.0–10.5)

## 2014-06-19 LAB — PRO B NATRIURETIC PEPTIDE: Pro B Natriuretic peptide (BNP): 4805 pg/mL — ABNORMAL HIGH (ref 0–125)

## 2014-06-19 LAB — TROPONIN I

## 2014-06-19 MED ORDER — FUROSEMIDE 10 MG/ML IJ SOLN
40.0000 mg | Freq: Once | INTRAMUSCULAR | Status: AC
  Administered 2014-06-19: 40 mg via INTRAVENOUS
  Filled 2014-06-19: qty 4

## 2014-06-19 MED ORDER — ALBUTEROL SULFATE (2.5 MG/3ML) 0.083% IN NEBU
INHALATION_SOLUTION | RESPIRATORY_TRACT | Status: AC
  Filled 2014-06-19: qty 3

## 2014-06-19 MED ORDER — ALBUTEROL SULFATE (2.5 MG/3ML) 0.083% IN NEBU
INHALATION_SOLUTION | RESPIRATORY_TRACT | Status: AC
  Administered 2014-06-19: 5 mg
  Filled 2014-06-19: qty 6

## 2014-06-19 NOTE — Progress Notes (Signed)
Patient arrived from [facility] via [facility]. Patient alert, oriented and ambulatory with stand by assist.  Admission weight, vitals and assessment completed. Dr Shirlee Latch notified of admission, awaiting admission orders. Patient currently resting comfortably, call light within reach. Will continue to monitor. Blood pressure 141/54, pulse 79, temperature 97.9 F (36.6 C), temperature source Oral, resp. rate 20, height 5\' 2"  (1.575 m), weight 88.497 kg (195 lb 1.6 oz), SpO2 100.00%. Troy Sine

## 2014-06-19 NOTE — ED Provider Notes (Signed)
CSN: [id number]     Arrival date & time 06/19/14  1910 History  This chart was scribed for Geoffery Lyons, MD by Charline Bills, ED Scribe. The patient was seen in room MHT14/MHT14. Patient's care was started at 7:28 PM.    Chief Complaint  Patient presents with  . Shortness of Breath   Patient is a 73 y.o. female presenting with shortness of breath. The history is provided by the patient. No language interpreter was used.  Shortness of Breath Severity:  Severe Onset quality:  Gradual Duration:  3 days Timing:  Constant Progression:  Worsening Associated symptoms: abdominal pain (mild), cough and wheezing   Associated symptoms: no chest pain    HPI Comments: [name] is a 73 y.o. female, with a h/o CHF, MI, defibrillator, who presents to the Emergency Department complaining of gradually worsening SOB over the past 3 days. Pt reports riding in a car to [facility] 3 days ago when she first noted SOB. She reports associated dry cough, bilateral leg swelling and mild abdominal pain. Pt denies chest pain. Pt is not on O2 at home. She denies h/o pulmonary related issues. Pt's granddaughter reports similar symptoms last year when pt was admitted for 1.5 week. Pt denies h/o smoking.    Past Medical History  Diagnosis Date  . History of ventricular tachycardia   . Peripheral edema   . Aortic insufficiency     Moderate 2012  . History of gout   . Exogenous obesity   . History of CHF (congestive heart failure)     EF 45% 2012, nonischemic (Normal coronaries 2002)  . 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 Hypertension   . 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 ICD (implantable cardiac defibrillator) in place 2012  . DOE (dyspnea on exertion)   . Stroke 2000  . Renal insufficiency   . Anxiety   . 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 Anemia   . Presence of IVC filter 2000  .  Osteopenia 09/01/2013  . Coronary artery disease   . Myocardial infarction 2000  . Pacemaker   . CHF (congestive heart failure)    Past Surgical History  Procedure Laterality Date  . Cardiac defibrillator placement    . Cataract extraction    . Embolectomy  02/15/2012    Procedure: EMBOLECTOMY;  Surgeon: [name], MD;  Location: [facility] OR;  Service: Vascular;  Laterality: Left;  left popliteal embolectomy with drain placement  . Insert / replace / remove pacemaker    . Eye surgery  2012    removal of cataracts bilaterally  . Cardiac catheterization    . Tonsillectomy    . Aortic valve replacement  04/12/2012    Procedure: AORTIC VALVE REPLACEMENT (AVR);  Surgeon: Delight Ovens, MD;  Location: [facility] OR;  Service: Open Heart Surgery;  Laterality: N/A;  with nitric oxide  . Thoracic aortic aneurysm repair  04/12/2012    Procedure: THORACIC ASCENDING ANEURYSM REPAIR (AAA);  Surgeon: Delight Ovens, MD;  Location: [facility] OR;  Service: Open Heart Surgery;  Laterality: N/A;  . Clot removal  5.2013    blood clot removed from the back of the left leg  . Aortic valve surgery  7.2013  . Coronary artery bypass graft  2014  . Cardiac valve replacement     Family History  Problem Relation Age of Onset  .  Hypertension Father   . Cancer Father   . Coronary artery disease Father 63  . Cancer Son     pancreas, spine   History  Substance Use Topics  . Smoking status: Never Smoker   . Smokeless tobacco: Never Used  . Alcohol Use: No   OB History   Grav Para Term Preterm Abortions TAB SAB Ect Mult Living                 Review of Systems  Respiratory: Positive for cough, shortness of breath and wheezing.   Cardiovascular: Positive for leg swelling. Negative for chest pain.  Gastrointestinal: Positive for abdominal pain (mild).  All other systems reviewed and are negative.  Allergies  Adhesive; Latex; and Neomycin-bacitracin zn-polymyx  Home Medications   Prior to Admission  medications   Medication Sig Start Date End Date Taking? Authorizing Provider  allopurinol (ZYLOPRIM) 100 MG tablet Take 1 tablet (100 mg total) by mouth 2 (two) times daily. 01/13/14   [name], MD  amLODipine (NORVASC) 10 MG tablet Take 0.5 tablets (5 mg total) by mouth daily. 11/21/13   Beatrice Lecher, PA-C  aspirin EC 81 MG tablet Take 1 tablet (81 mg total) by mouth daily. 01/13/14   [name], MD  Biotin 5000 MCG CAPS Take 1 capsule (5,000 mcg total) by mouth daily. 09/02/12   Edwyna Perfect, MD  Bisacodyl (DULCOLAX PO) Take 1 tablet by mouth daily as needed (for constipation).    Historical Provider, MD  bisoprolol (ZEBETA) 10 MG tablet Take 1 tablet (10 mg total) by mouth 2 (two) times daily. 12/12/13   Duke Salvia, MD  Calcium Carb-Cholecalciferol (CALCIUM-VITAMIN D) 600-400 MG-UNIT TABS Take 1 tablet by mouth daily.    Historical Provider, MD  furosemide (LASIX) 40 MG tablet TAKE 1 TABLET TWICE A DAY 01/13/14   [name], MD  lisinopril (PRINIVIL,ZESTRIL) 10 MG tablet Take 1 tablet (10 mg total) by mouth daily. 06/12/14   Sandford Craze, NP  methylPREDNISolone (MEDROL DOSEPAK) 4 MG tablet follow package directions 06/12/14   Sandford Craze, NP  potassium chloride SA (K-DUR,KLOR-CON) 20 MEQ tablet Take 1 tablet (20 mEq total) by mouth 3 (three) times daily. 12/01/13   Beatrice Lecher, PA-C  rosuvastatin (CRESTOR) 5 MG tablet Take 1 tablet (5 mg total) by mouth daily. 01/13/14   [name], MD  senna-docusate (SENOKOT S) 8.6-50 MG per tablet Take 2 tablets by mouth at bedtime. For constipation, also available OTC 11/11/13   Ripudeep Jenna Luo, MD  warfarin (COUMADIN) 5 MG tablet Take as directed by coumadin clinic 11/29/13   [name], MD   Triage Vitals: BP 146/108  Temp(Src) 97.4 F (36.3 C) (Oral)  Resp 40  Wt 196 lb (88.905 kg)  SpO2 100% Physical Exam  Nursing note and vitals reviewed. Constitutional: She is oriented to person, place, and time. She  appears well-developed and well-nourished. No distress.  HENT:  Head: Normocephalic and atraumatic.  Eyes: Conjunctivae and EOM are normal.  Neck: Neck supple. No tracheal deviation present.  Cardiovascular: Normal rate.   Pulmonary/Chest: Tachypnea noted. She is in respiratory distress (moderate). She has rales (bilateral).  Musculoskeletal: Normal range of motion. She exhibits edema.  2+ lower extremity edema  Neurological: She is alert and oriented to person, place, and time.  Skin: Skin is warm and dry.  Psychiatric: She has a normal mood and affect. Her behavior is normal.   ED Course  Procedures (including critical care time) DIAGNOSTIC STUDIES:  Oxygen Saturation is 90% on RA, low by my interpretation.    COORDINATION OF CARE: 7:34 PM-Discussed treatment plan which includes nebulizer treatment with pt at bedside and pt agreed to plan.   Labs Review Labs Reviewed  CBC WITH DIFFERENTIAL - Abnormal; Notable for the following:    WBC 14.3 (*)    Neutro Abs 10.1 (*)    Monocytes Absolute 1.4 (*)    All other components within normal limits  COMPREHENSIVE METABOLIC PANEL - Abnormal; Notable for the following:    Potassium 3.6 (*)    Glucose, Bld 140 (*)    BUN 24 (*)    Creatinine, Ser 1.50 (*)    GFR calc non Af Amer 34 (*)    GFR calc Af Amer 39 (*)    All other components within normal limits  PRO B NATRIURETIC PEPTIDE - Abnormal; Notable for the following:    Pro B Natriuretic peptide (BNP) 4805.0 (*)    All other components within normal limits  TROPONIN I  PROTIME-INR   Imaging Review Dg Chest Port 1 View  06/19/2014   CLINICAL DATA:  Cough and shortness of breath with wheezing since this morning  EXAM: PORTABLE CHEST - 1 VIEW  COMPARISON:  November 06, 2013  FINDINGS: Mild cardiac enlargement stable. Status post median sternotomy and replacement valve. Single lead cardiac pacer. These findings are all stable.  There is mild vascular congestion and perihilar  peribronchial cuffing. No definite Kerley B-lines. No consolidation or effusion.  IMPRESSION: Findings suggest the possibility of very mild interstitial pulmonary edema.   Electronically Signed   By: Esperanza Heir M.D.   On: 06/19/2014 19:46    Date: 06/20/2014  Rate: 60  Rhythm: atrial fibrillation  QRS Axis: left  Intervals: normal  ST/T Wave abnormalities: nonspecific T wave changes  Conduction Disutrbances:left bundle branch block  Narrative Interpretation:   Old EKG Reviewed: unchanged    MDM   Final diagnoses:  None    Patient presents with complaints of dyspnea and tightness in the chest. She is tachypneic and hypoxic upon arrival. Workup reveals elevated BNP and findings consistent with possible pulmonary edema. This appears to be an exacerbation of CHF. She was given IV Lasix in the ER however has not diuresed much. I've discussed the case with Dr. Shirlee Latch from cardiology who agrees to accept the patient in transfer.  I personally performed the services described in this documentation, which was scribed in my presence. The recorded information has been reviewed and is accurate.    Geoffery Lyons, MD 06/20/14 [phone]

## 2014-06-19 NOTE — ED Notes (Signed)
Pt c//o SOB since Friday morning. Pt was at [facility] and family went and picked her up today. On the way back from the beach, her wheezing and cough became much worse.

## 2014-06-19 NOTE — Plan of Care (Signed)
Problem: Phase I Progression Outcomes Goal: EF % per last Echo/documented,Core Reminder form on chart Outcome: Completed/Met Date Met:  06/19/14 EF (11/05/2013) 45-50%     

## 2014-06-19 NOTE — ED Notes (Signed)
Pt's registered next of kin is out of town. Please call grand daughter, Bonita Quin at [phone] if needed.

## 2014-06-20 ENCOUNTER — Telehealth: Payer: Self-pay | Admitting: *Deleted

## 2014-06-20 ENCOUNTER — Ambulatory Visit: Payer: Medicare Other | Admitting: Medical

## 2014-06-20 DIAGNOSIS — I5033 Acute on chronic diastolic (congestive) heart failure: Secondary | ICD-10-CM | POA: Diagnosis present

## 2014-06-20 DIAGNOSIS — J9801 Acute bronchospasm: Secondary | ICD-10-CM

## 2014-06-20 DIAGNOSIS — J209 Acute bronchitis, unspecified: Secondary | ICD-10-CM

## 2014-06-20 DIAGNOSIS — I509 Heart failure, unspecified: Secondary | ICD-10-CM

## 2014-06-20 DIAGNOSIS — I5043 Acute on chronic combined systolic (congestive) and diastolic (congestive) heart failure: Secondary | ICD-10-CM

## 2014-06-20 DIAGNOSIS — Z95810 Presence of automatic (implantable) cardiac defibrillator: Secondary | ICD-10-CM

## 2014-06-20 DIAGNOSIS — N183 Chronic kidney disease, stage 3 unspecified: Secondary | ICD-10-CM

## 2014-06-20 DIAGNOSIS — I4891 Unspecified atrial fibrillation: Secondary | ICD-10-CM

## 2014-06-20 DIAGNOSIS — I369 Nonrheumatic tricuspid valve disorder, unspecified: Secondary | ICD-10-CM

## 2014-06-20 LAB — CREATININE, SERUM
Creatinine, Ser: 1.41 mg/dL — ABNORMAL HIGH (ref 0.50–1.10)
GFR calc Af Amer: 42 mL/min — ABNORMAL LOW (ref >90)
GFR, EST NON AFRICAN AMERICAN: 36 mL/min — AB (ref >90)

## 2014-06-20 LAB — CBC
HEMATOCRIT: 38 % (ref 36.0–46.0)
Hemoglobin: 12.5 g/dL (ref 12.0–15.0)
MCH: 31 pg (ref 26.0–34.0)
MCHC: 32.9 g/dL (ref 30.0–36.0)
MCV: 94.3 fL (ref 78.0–100.0)
Platelets: 147 10*3/uL — ABNORMAL LOW (ref 150–400)
RBC: 4.03 MIL/uL (ref 3.87–5.11)
RDW: 14.6 % (ref 11.5–15.5)
WBC: 11.1 10*3/uL — ABNORMAL HIGH (ref 4.0–10.5)

## 2014-06-20 LAB — PROTIME-INR
INR: 1.99 — ABNORMAL HIGH (ref 0.00–1.49)
Prothrombin Time: 22.6 seconds — ABNORMAL HIGH (ref 11.6–15.2)

## 2014-06-20 LAB — TROPONIN I

## 2014-06-20 LAB — TSH: TSH: 1.4 u[IU]/mL (ref 0.350–4.500)

## 2014-06-20 MED ORDER — AZITHROMYCIN 500 MG PO TABS
500.0000 mg | ORAL_TABLET | Freq: Every day | ORAL | Status: AC
  Administered 2014-06-20: 500 mg via ORAL
  Filled 2014-06-20: qty 1

## 2014-06-20 MED ORDER — SENNOSIDES-DOCUSATE SODIUM 8.6-50 MG PO TABS
2.0000 | ORAL_TABLET | Freq: Every day | ORAL | Status: DC
  Administered 2014-06-21 – 2014-06-22 (×2): 2 via ORAL
  Filled 2014-06-20 (×5): qty 2

## 2014-06-20 MED ORDER — WARFARIN - PHARMACIST DOSING INPATIENT: Freq: Every day | Status: DC

## 2014-06-20 MED ORDER — SODIUM CHLORIDE 0.9 % IV SOLN: 250.0000 mL | INTRAVENOUS | Status: DC | PRN

## 2014-06-20 MED ORDER — ALLOPURINOL 100 MG PO TABS
100.0000 mg | ORAL_TABLET | Freq: Two times a day (BID) | ORAL | Status: DC
  Administered 2014-06-20 – 2014-06-23 (×7): 100 mg via ORAL
  Filled 2014-06-20 (×8): qty 1

## 2014-06-20 MED ORDER — ALBUTEROL SULFATE (2.5 MG/3ML) 0.083% IN NEBU: 2.5000 mg | INHALATION_SOLUTION | Freq: Four times a day (QID) | RESPIRATORY_TRACT | Status: DC

## 2014-06-20 MED ORDER — HEPARIN SODIUM (PORCINE) 5000 UNIT/ML IJ SOLN
5000.0000 [IU] | Freq: Three times a day (TID) | INTRAMUSCULAR | Status: DC
  Administered 2014-06-20 – 2014-06-23 (×10): 5000 [IU] via SUBCUTANEOUS
  Filled 2014-06-20 (×13): qty 1

## 2014-06-20 MED ORDER — ALBUTEROL SULFATE (2.5 MG/3ML) 0.083% IN NEBU: 2.5000 mg | INHALATION_SOLUTION | Freq: Four times a day (QID) | RESPIRATORY_TRACT | Status: DC | PRN

## 2014-06-20 MED ORDER — IPRATROPIUM-ALBUTEROL 0.5-2.5 (3) MG/3ML IN SOLN
3.0000 mL | RESPIRATORY_TRACT | Status: DC
  Administered 2014-06-20 – 2014-06-21 (×4): 3 mL via RESPIRATORY_TRACT
  Filled 2014-06-20 (×4): qty 3

## 2014-06-20 MED ORDER — SODIUM CHLORIDE 0.9 % IJ SOLN
3.0000 mL | Freq: Two times a day (BID) | INTRAMUSCULAR | Status: DC
  Administered 2014-06-20 – 2014-06-23 (×7): 3 mL via INTRAVENOUS

## 2014-06-20 MED ORDER — AMLODIPINE BESYLATE 5 MG PO TABS
5.0000 mg | ORAL_TABLET | Freq: Every day | ORAL | Status: DC
  Administered 2014-06-20 – 2014-06-23 (×4): 5 mg via ORAL
  Filled 2014-06-20 (×4): qty 1

## 2014-06-20 MED ORDER — MOMETASONE FURO-FORMOTEROL FUM 100-5 MCG/ACT IN AERO
2.0000 | INHALATION_SPRAY | Freq: Two times a day (BID) | RESPIRATORY_TRACT | Status: DC
  Administered 2014-06-20 – 2014-06-23 (×6): 2 via RESPIRATORY_TRACT
  Filled 2014-06-20: qty 8.8

## 2014-06-20 MED ORDER — POTASSIUM CHLORIDE CRYS ER 20 MEQ PO TBCR
20.0000 meq | EXTENDED_RELEASE_TABLET | Freq: Three times a day (TID) | ORAL | Status: DC
  Administered 2014-06-20 – 2014-06-23 (×10): 20 meq via ORAL
  Filled 2014-06-20 (×13): qty 1

## 2014-06-20 MED ORDER — ASPIRIN EC 81 MG PO TBEC
81.0000 mg | DELAYED_RELEASE_TABLET | Freq: Every day | ORAL | Status: DC
  Administered 2014-06-20 – 2014-06-23 (×4): 81 mg via ORAL
  Filled 2014-06-20 (×4): qty 1

## 2014-06-20 MED ORDER — WARFARIN SODIUM 2.5 MG PO TABS
2.5000 mg | ORAL_TABLET | Freq: Once | ORAL | Status: AC
  Administered 2014-06-20: 2.5 mg via ORAL
  Filled 2014-06-20: qty 1

## 2014-06-20 MED ORDER — ONDANSETRON HCL 4 MG/2ML IJ SOLN: 4.0000 mg | Freq: Four times a day (QID) | INTRAMUSCULAR | Status: DC | PRN

## 2014-06-20 MED ORDER — BISACODYL 5 MG PO TBEC: 5.0000 mg | DELAYED_RELEASE_TABLET | Freq: Every day | ORAL | Status: DC | PRN

## 2014-06-20 MED ORDER — BISOPROLOL FUMARATE 10 MG PO TABS
10.0000 mg | ORAL_TABLET | Freq: Two times a day (BID) | ORAL | Status: DC
  Administered 2014-06-20 – 2014-06-23 (×7): 10 mg via ORAL
  Filled 2014-06-20 (×9): qty 1

## 2014-06-20 MED ORDER — FUROSEMIDE 10 MG/ML IJ SOLN
60.0000 mg | Freq: Three times a day (TID) | INTRAMUSCULAR | Status: DC
  Administered 2014-06-20 – 2014-06-21 (×4): 60 mg via INTRAVENOUS
  Filled 2014-06-20 (×3): qty 6

## 2014-06-20 MED ORDER — ATORVASTATIN CALCIUM 10 MG PO TABS
10.0000 mg | ORAL_TABLET | Freq: Every day | ORAL | Status: DC
  Administered 2014-06-20 – 2014-06-22 (×3): 10 mg via ORAL
  Filled 2014-06-20 (×4): qty 1

## 2014-06-20 MED ORDER — FUROSEMIDE 10 MG/ML IJ SOLN: 60.0000 mg | Freq: Three times a day (TID) | INTRAMUSCULAR | Status: DC

## 2014-06-20 MED ORDER — FUROSEMIDE 10 MG/ML IJ SOLN
60.0000 mg | Freq: Three times a day (TID) | INTRAMUSCULAR | Status: DC
  Filled 2014-06-20 (×3): qty 6

## 2014-06-20 MED ORDER — SODIUM CHLORIDE 0.9 % IJ SOLN: 3.0000 mL | INTRAMUSCULAR | Status: DC | PRN

## 2014-06-20 MED ORDER — AZITHROMYCIN 250 MG PO TABS
250.0000 mg | ORAL_TABLET | Freq: Every day | ORAL | Status: DC
Start: 2014-06-21 — End: 2014-06-23
  Administered 2014-06-21 – 2014-06-23 (×3): 250 mg via ORAL
  Filled 2014-06-20 (×3): qty 1

## 2014-06-20 MED ORDER — FUROSEMIDE 10 MG/ML IJ SOLN
INTRAMUSCULAR | Status: AC
  Filled 2014-06-20: qty 8

## 2014-06-20 MED ORDER — LISINOPRIL 10 MG PO TABS
10.0000 mg | ORAL_TABLET | Freq: Every day | ORAL | Status: DC
  Administered 2014-06-20 – 2014-06-23 (×4): 10 mg via ORAL
  Filled 2014-06-20 (×4): qty 1

## 2014-06-20 MED ORDER — ACETAMINOPHEN 325 MG PO TABS: 650.0000 mg | ORAL_TABLET | ORAL | Status: DC | PRN

## 2014-06-20 NOTE — Progress Notes (Signed)
  Echocardiogram 2D Echocardiogram has been performed.  [name] [name] 06/20/2014, 10:33 AM

## 2014-06-20 NOTE — Telephone Encounter (Signed)
Please do not charge No Show d/t hospitalization/SLS Thanks.  Confidential Office Message [address] [place], [place] [address] p. [phone] f. [phone] To: Braulio Conte at [facility] (After Hours Triage) Fax: [phone] From: [facility] Date/ Time: 06/19/2014 9:09 PM Taken By: Achille Rich, CSR Caller: Crystal Facility: not collected Patient: [name] DOB: 02-10-1941 Phone: [phone] Reason for Call: See info below Regarding Appointment: Yes Appt Date: 06/20/2014 Appt Time: 10:00:00 AM Provider: Esperanza Richters Reason: Cancel Appointment Details: patient has since been admited to the hospital Outcome: Cancelled appointment in EPIC [facility]) Confidential

## 2014-06-20 NOTE — Care Management Note (Signed)
    Page 1 of 1   06/20/2014     2:35:56 PM CARE MANAGEMENT NOTE 06/20/2014  Patient:  [name], [name]   Account Number:  [id number]  Date Initiated:  06/20/2014  Documentation initiated by:  [facility]  Subjective/Objective Assessment:   73 yo with history of nonischemic cardiomyopathy, severe AI s/p AVR and ascending aorta replacement, and permanent atrial fibrillation presented with dyspnea.  //Home with children     Action/Plan:   - Lasix 60 mg IV every 8 hours.  - repeat echo  -Continue warfarin//Access for disposition needs   Anticipated DC Date:  06/24/2014   Anticipated DC Plan:  HOME W HOME HEALTH SERVICES      DC Planning Services  CM consult      Choice offered to / List presented to:             Status of service:   Medicare Important Message given?   (If response is "NO", the following Medicare IM given date fields will be blank) Date Medicare IM given:   Medicare IM given by:   Date Additional Medicare IM given:   Additional Medicare IM given by:    Discharge Disposition:    Per UR Regulation:    If discussed at Long Length of Stay Meetings, dates discussed:    Comments:

## 2014-06-20 NOTE — Progress Notes (Signed)
ANTICOAGULATION CONSULT NOTE - Initial Consult  Pharmacy Consult for Coumadin Indication: AVR  Allergies  Allergen Reactions  . Adhesive [Tape] Itching, Rash and Other (See Comments)    Redness and skin breaks down  . Latex Rash  . Neomycin-Bacitracin Zn-Polymyx Rash    Patient Measurements: Height: 5\' 2"  (157.5 cm) Weight: 195 lb 1.6 oz (88.497 kg) (b scale) IBW/kg (Calculated) : 50.1  Vital Signs: Temp: 97.9 F (36.6 C) (09/21 2302) Temp src: Oral (09/21 2302) BP: 141/54 mmHg (09/21 2302) Pulse Rate: 79 (09/21 2302)  Labs:  Recent Labs  06/19/14 1930  HGB 13.6  HCT 40.3  PLT 150  LABPROT 22.2*  INR 1.95*  CREATININE 1.50*  TROPONINI <0.30    Estimated Creatinine Clearance: 35.1 ml/min (by C-G formula based on Cr of 1.5).   Medical History: Past Medical History  Diagnosis Date  . History of ventricular tachycardia   . Peripheral edema   . Aortic insufficiency     Moderate 2012  . History of gout   . Exogenous obesity   . History of CHF (congestive heart failure)     EF 45% 2012, nonischemic (Normal coronaries 2002)  . 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 Hypertension   . 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 ICD (implantable cardiac defibrillator) in place 2012  . DOE (dyspnea on exertion)   . Stroke 2000  . Renal insufficiency   . Anxiety   . 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 Anemia   . Presence of IVC filter 2000  . Osteopenia 09/01/2013  . Coronary artery disease   . Myocardial infarction 2000  . Pacemaker   . CHF (congestive heart failure)   . DVT (deep venous thrombosis)     Medications:  Prescriptions prior to admission  Medication Sig Dispense Refill  . allopurinol (ZYLOPRIM) 100 MG tablet Take 1 tablet (100 mg total) by mouth 2 (two) times daily.  60 tablet  5  . amLODipine (NORVASC) 10 MG tablet Take 0.5 tablets (5 mg  total) by mouth daily.  30 tablet  5  . aspirin EC 81 MG tablet Take 1 tablet (81 mg total) by mouth daily.  90 tablet  3  . Biotin 5000 MCG CAPS Take 1 capsule (5,000 mcg total) by mouth daily.  30 capsule  6  . Bisacodyl (DULCOLAX PO) Take 1 tablet by mouth daily as needed (for constipation).      . bisoprolol (ZEBETA) 10 MG tablet Take 1 tablet (10 mg total) by mouth 2 (two) times daily.  60 tablet  6  . Calcium Carb-Cholecalciferol (CALCIUM-VITAMIN D) 600-400 MG-UNIT TABS Take 1 tablet by mouth daily.      . furosemide (LASIX) 40 MG tablet TAKE 1 TABLET TWICE A DAY  60 tablet  5  . lisinopril (PRINIVIL,ZESTRIL) 10 MG tablet Take 1 tablet (10 mg total) by mouth daily.  30 tablet  2  . methylPREDNISolone (MEDROL DOSEPAK) 4 MG tablet follow package directions  21 tablet  0  . potassium chloride SA (K-DUR,KLOR-CON) 20 MEQ tablet Take 1 tablet (20 mEq total) by mouth 3 (three) times daily.  90 tablet  11  . rosuvastatin (CRESTOR) 5 MG tablet Take 1 tablet (5 mg total) by mouth daily.  30 tablet  5  . senna-docusate (SENOKOT S) 8.6-50 MG per tablet Take 2 tablets by mouth at bedtime. For constipation, also  available OTC  60 tablet  3  . warfarin (COUMADIN) 5 MG tablet Take as directed by coumadin clinic  30 tablet  3    Assessment: 73 yo female with CHF, h/o Afib, to continue Coumadin  Goal of Therapy:  INR 2-3 Monitor platelets by anticoagulation protocol: Yes   Plan:  F/U home regimen in am Daily INR  Jaylissa Felty, Gary Fleet 06/20/2014,12:48 AM

## 2014-06-20 NOTE — Progress Notes (Signed)
ANTICOAGULATION CONSULT NOTE - Follow up  Pharmacy Consult for Coumadin Indication: AVR  Allergies  Allergen Reactions  . Adhesive [Tape] Itching, Rash and Other (See Comments)    Redness and skin breaks down  . Latex Rash  . Neomycin-Bacitracin Zn-Polymyx Rash    Patient Measurements: Height:  (157.5 cm) Weight: 194 lb 9.6 oz (88.27 kg) (b scale) IBW/kg (Calculated) : 50.1  Vital Signs: Temp: 97.6 F (36.4 C) (09/22 1544) Temp src: Oral (09/22 1544) BP: 145/87 mmHg (09/22 1544) Pulse Rate: 67 (09/22 1544)  Labs:  Recent Labs  06/19/14 1930 06/20/14 0110 06/20/14 0304 06/20/14 1244  HGB 13.6 12.5  --   --   HCT 40.3 38.0  --   --   PLT 150 147*  --   --   LABPROT 22.2*  --  22.6*  --   INR 1.95*  --  1.99*  --   CREATININE 1.50* 1.41*  --   --   TROPONINI <0.30 <0.30  --  <0.30    Estimated Creatinine Clearance: 37.2 ml/min (by C-G formula based on Cr of 1.41).   Medical History: Past Medical History  Diagnosis Date  . History of ventricular tachycardia   . Peripheral edema   . Aortic insufficiency     Moderate 2012  . History of gout   . Exogenous obesity   . History of CHF (congestive heart failure)     EF 45% 2012, nonischemic (Normal coronaries 2002)  . 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 Hypertension   . 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 ICD (implantable cardiac defibrillator) in place 2012  . DOE (dyspnea on exertion)   . Stroke 2000  . Renal insufficiency   . Anxiety   . 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 Anemia   . Presence of IVC filter 2000  . Osteopenia 09/01/2013  . Coronary artery disease   . Myocardial infarction 2000  . Pacemaker   . CHF (congestive heart failure)   . DVT (deep venous thrombosis)     Medications:  Prescriptions prior to admission  Medication Sig Dispense Refill  . acetaminophen (TYLENOL)  500 MG tablet Take 500 mg by mouth every 6 (six) hours as needed for moderate pain.      [name] allopurinol (ZYLOPRIM) 100 MG tablet Take 1 tablet (100 mg total) by mouth 2 (two) times daily.  60 tablet  5  . amLODipine (NORVASC) 10 MG tablet Take 10 mg by mouth daily.      [name] aspirin EC 81 MG tablet Take 1 tablet (81 mg total) by mouth daily.  90 tablet  3  . Biotin 5000 MCG CAPS Take 1 capsule (5,000 mcg total) by mouth daily.  30 capsule  6  . bisoprolol (ZEBETA) 10 MG tablet Take 1 tablet (10 mg total) by mouth 2 (two) times daily.  60 tablet  6  . Calcium Carb-Cholecalciferol (CALCIUM-VITAMIN D) 600-400 MG-UNIT TABS Take 1 tablet by mouth daily.      . furosemide (LASIX) 40 MG tablet Take 40 mg by mouth 2 (two) times daily.      [name] lisinopril (PRINIVIL,ZESTRIL) 10 MG tablet Take 1 tablet (10 mg total) by mouth daily.  30 tablet  2  . OVER THE COUNTER MEDICATION Take 1 tablet by mouth daily as needed (for cough and cold). "[facility] Cough & Cold HBP"      .  potassium chloride SA (K-DUR,KLOR-CON) 20 MEQ tablet Take 1 tablet (20 mEq total) by mouth 3 (three) times daily.  90 tablet  11  . rosuvastatin (CRESTOR) 5 MG tablet Take 1 tablet (5 mg total) by mouth daily.  30 tablet  5  . senna-docusate (SENOKOT S) 8.6-50 MG per tablet Take 2 tablets by mouth at bedtime. For constipation, also available OTC  60 tablet  3  . warfarin (COUMADIN) 5 MG tablet Take 2.5-5 mg by mouth daily. Take 5 mg by mouth on Wednesday, then take 2.5 mg by mouth on all other days.        Assessment: 73 yo female with CHF, h/o Afib, to continue Coumadin.  Her home dose was 2.5 mg daily except 5 mg qWed.  INR 1.99 today.   Azithromycin started today which is likely to increase coumadin effect.     Goal of Therapy:  INR 2-3 Monitor platelets by anticoagulation protocol: Yes   Plan:  Coumadin 2.5 mg today.  Daily INR Discontinue SQ heparin when INR =or >2.0  [name], RPh Clinical Pharmacist Pager: [phone] 06/20/2014,4:05  PM

## 2014-06-20 NOTE — Progress Notes (Signed)
Patient Name: [name] Date of Encounter: 06/20/2014     Active Problems:   CHF exacerbation   Acute exacerbation of CHF (congestive heart failure)   Acute on chronic diastolic CHF (congestive heart failure)    SUBJECTIVE  Denies any CP, only had CP with cough. SOB getting better. Still very winded during conversation  CURRENT MEDS . albuterol      . allopurinol  100 mg Oral BID  . amLODipine  5 mg Oral Daily  . aspirin EC  81 mg Oral Daily  . atorvastatin  10 mg Oral q1800  . bisoprolol  10 mg Oral BID  . furosemide      . furosemide  60 mg Intravenous Q8H  . heparin  5,000 Units Subcutaneous 3 times per day  . lisinopril  10 mg Oral Daily  . potassium chloride SA  20 mEq Oral TID  . senna-docusate  2 tablet Oral QHS  . sodium chloride  3 mL Intravenous Q12H  . Warfarin - Pharmacist Dosing Inpatient   Does not apply q1800    OBJECTIVE  Filed Vitals:   06/20/14 0043 06/20/14 0227 06/20/14 0549 06/20/14 0703  BP:  155/62 138/74   Pulse:  71 64   Temp:  98 F (36.7 C) 97.7 F (36.5 C)   TempSrc:  Oral Oral   Resp:  20 22   Height:      Weight: 195 lb 1.6 oz (88.497 kg)   194 lb 9.6 oz (88.27 kg)  SpO2:  100% 100%    No intake or output data in the 24 hours ending 06/20/14 0832 Filed Weights   06/19/14 2302 06/20/14 0043 06/20/14 0703  Weight: 195 lb 1.6 oz (88.497 kg) 195 lb 1.6 oz (88.497 kg) 194 lb 9.6 oz (88.27 kg)    PHYSICAL EXAM  General: Pleasant, NAD. Neuro: Alert and oriented X 3. Moves all extremities spontaneously. Psych: Normal affect. HEENT:  Normal  Neck: Supple without bruits or JVD. Lungs:  Resp regular and unlabored no significant rale on exam, does have some mild expiratory wheezing.  Heart: irregular no s3, s4. 1/6 systolic murmur Abdomen: Soft, non-tender, non-distended, BS + x 4.  Extremities: No clubbing, cyanosis. DP/PT/Radials 2+ and equal bilaterally. Mainly nonpitting edema  Accessory Clinical  Findings  CBC  Recent Labs  06/19/14 1930 06/20/14 0110  WBC 14.3* 11.1*  NEUTROABS 10.1*  --   HGB 13.6 12.5  HCT 40.3 38.0  MCV 95.0 94.3  PLT 150 147*   Basic Metabolic Panel  Recent Labs  06/19/14 1930 06/20/14 0110  NA 144  --   K 3.6*  --   CL 101  --   CO2 28  --   GLUCOSE 140*  --   BUN 24*  --   CREATININE 1.50* 1.41*  CALCIUM 9.6  --    Liver Function Tests  Recent Labs  06/19/14 1930  AST 23  ALT 14  ALKPHOS 77  BILITOT 0.7  PROT 8.3  ALBUMIN 3.9   No results found for this basename: LIPASE, AMYLASE,  in the last 72 hours Cardiac Enzymes  Recent Labs  06/19/14 1930 06/20/14 0110  TROPONINI <0.30 <0.30   Thyroid Function Tests  Recent Labs  06/20/14 0110  TSH 1.400    TELE A-fib with HR 60-70s, occasionally ventricularly paced rhythm    ECG  A-fib with HR 60s, LBBB  Echocardiogram 11/05/2013  LV EF: 45% - 50%  ------------------------------------------------------------ Indications: CHF - 428.0.  ------------------------------------------------------------  History: PMH: Atrial fibrillation. Risk factors: Hypertension. Dyslipidemia.  ------------------------------------------------------------ Study Conclusions  - Left ventricle: The cavity size was normal. There was moderate concentric hypertrophy. Systolic function was mildly reduced. The estimated ejection fraction was in the range of 45% to 50%. Wall motion was normal; there were no regional wall motion abnormalities. The study is not technically sufficient to allow evaluation of LV diastolic function. - Aortic valve: A bioprosthetic valve sits well in the aortic position. The leaflets open well. There is no central aortic insufficiency or paravalvular leak. The mean transaortic gradient is37mmHg and the peak gradient is 21 mmHg and are normal for this type of prosthesis. - Aorta: Normal size ascending aorta measiring 34 mm. Aortic root is mildly dilated and  measures 43 mm. - Mitral valve: No regurgitation. - Left atrium: The atrium was mildly dilated. - Right ventricle: The cavity size was mildly dilated. Wall thickness was normal. Systolic function was mildly reduced. - Tricuspid valve: Trivial regurgitation. - Pulmonic valve: No regurgitation. - Pericardium, extracardiac: A trivial pericardial effusion was identified. There was a left pleural effusion.      Radiology/Studies  Dg Chest Port 1 View  06/19/2014   CLINICAL DATA:  Cough and shortness of breath with wheezing since this morning  EXAM: PORTABLE CHEST - 1 VIEW  COMPARISON:  November 06, 2013  FINDINGS: Mild cardiac enlargement stable. Status post median sternotomy and replacement valve. Single lead cardiac pacer. These findings are all stable.  There is mild vascular congestion and perihilar peribronchial cuffing. No definite Kerley B-lines. No consolidation or effusion.  IMPRESSION: Findings suggest the possibility of very mild interstitial pulmonary edema.   Electronically Signed   By: Esperanza Heir M.D.   On: 06/19/2014 19:46    ASSESSMENT AND PLAN  73 yo female with PMH of nonischemic cardiomyopathy transferred from [facility] for CHF  1. Acute on chronic primarily diastolic HF  - on lasix  BID daily, has been having progressively worsening SOB despite medication compliance and dietary compliance, may need higher dose on discharge  - continue IV lasix  Q8Hr. Pending Echocardiogram  - mild expiratory wheezing on exam, no h/o asthma or COPD, nonsmoker, will give breathing treatment in addition to lasix  2. Permanent Atrial fibrillation: Chronic. Good rate control on bisoprolol. Continue warfarin.  3. Bioprosthetic aortic valve: Will assess by echo.  4. CKD: Stable compared to prior. Follow closely with diuresis.  5. HTN: Continue home meds including amlodipine.   6. Chest pain: Prolonged, negative enzymes, resolved with lasix. Some chest wall tenderness.  Continue to have CP with cough only (otherwise no CP), low suspicion for ACS  7. H/o nonischemic cardiomyopathy  - Nonobstructive mild CAD in 2002 and in 7/13 on coronary angiography. Medtronic ICD 2/13.   - Echo (2/15) with EF 45-50%, bioprosthetic aortic valve with mean gradient 11, RV mildly dilated with mildly decreased systolic function.  Ramond Dial PA-C Pager: [phone] Patient seen and examined and history reviewed. Agree with above findings and plan. Patient states she is improved but is still quite dyspneic at rest. Wheezing. Symptoms started with cough while at the beach on Sunday. Similar to hospitalization in February. On exam she has diffuse wheezing. 1/6 systolic murmur. Mild edema. Echo shows stable valve function. EF 45-50%. I think this is a combination of acute CHF and bronchitis with bronchospasm. Apparently she just came off steroid taper for back pain. Will continue diuresis. Nebulizer therapy. Z pak. If wheezing does not improve will need  to consider putting back on steroids.  Denean Pavon [place], MDFACC 06/20/2014 1:58 PM

## 2014-06-20 NOTE — H&P (Addendum)
Physician History and Physical    [name] MRN: [id number] DOB/AGE: 12-22-1940 73 y.o. Admit date: 06/19/2014  Primary Cardiologist: Dr Patty Sermons  HPI: 73 yo with history of nonischemic cardiomyopathy, severe AI s/p AVR and ascending aorta replacement, and permanent atrial fibrillation presented with dyspnea.  Patient states that she has been increasingly short of breath over the last week.  She cannot think of any particular trigger.  She has not been eating out a lot, she has not missed her Lasix.  She is in atrial fibrillation, but this is chronic and rate-controlled.  Her symptoms have gradually worsened to the point where she was short of breath at rest today.  She has also been having chest soreness that started Saturday and continued constantly until today.  It resolved today after she got Lasix.  Her chest wall was mildly tender.  +Orthopnea.   In the ER, she was mildly hypoxic and got IV lasix.  BP was mildly elevated.  CXR showed pulmonary edema.  ECG showed LBBB similar to prior.  Initial cardiac enzymes negative, BNP elevated.    Review of systems complete and found to be negative unless listed above   PMH: 1. CKD 2. Gout 3. Nonischemic cardiomyopathy: Nonobstructive mild CAD in 2002 and in 7/13 on coronary angiography.  Medtronic ICD 2/13.  Echo (2/15) with EF 45-50%, bioprosthetic aortic valve with mean gradient 11, RV mildly dilated with mildly decreased systolic function. 4. Atrial fibrillation: Permanent, on coumadin.  5. History of remote intracerebral hematoma.  6. HTN 7. Severe aortic insufficiency s/p bioprosthetic AVR with LAA clipping and ascending aorta replacement with hemashield graft.   8. DVT with h/o IVC filter 9. PAD  Family History  Problem Relation Age of Onset  . Hypertension Father   . Cancer Father   . Coronary artery disease Father 56  . Cancer Son     pancreas, spine    History   Social History  . Marital Status: Divorced    Spouse  Name: N/A    Number of Children: N/A  . Years of Education: N/A   Occupational History  . Not on file.   Social History Main Topics  . Smoking status: Never Smoker   . Smokeless tobacco: Never Used  . Alcohol Use: No  . Drug Use: No  . Sexual Activity: No   Other Topics Concern  . Not on file   Social History Narrative   Lives with daughter.       Prescriptions prior to admission  Medication Sig Dispense Refill  . allopurinol (ZYLOPRIM) 100 MG tablet Take 1 tablet (100 mg total) by mouth 2 (two) times daily.  60 tablet  5  . amLODipine (NORVASC) 10 MG tablet Take 0.5 tablets (5 mg total) by mouth daily.  30 tablet  5  . aspirin EC 81 MG tablet Take 1 tablet (81 mg total) by mouth daily.  90 tablet  3  . Biotin 5000 MCG CAPS Take 1 capsule (5,000 mcg total) by mouth daily.  30 capsule  6  . Bisacodyl (DULCOLAX PO) Take 1 tablet by mouth daily as needed (for constipation).      . bisoprolol (ZEBETA) 10 MG tablet Take 1 tablet (10 mg total) by mouth 2 (two) times daily.  60 tablet  6  . Calcium Carb-Cholecalciferol (CALCIUM-VITAMIN D) 600-400 MG-UNIT TABS Take 1 tablet by mouth daily.      . furosemide (LASIX) 40 MG tablet TAKE 1 TABLET TWICE A DAY  60 tablet  5  . lisinopril (PRINIVIL,ZESTRIL) 10 MG tablet Take 1 tablet (10 mg total) by mouth daily.  30 tablet  2  . methylPREDNISolone (MEDROL DOSEPAK) 4 MG tablet follow package directions  21 tablet  0  . potassium chloride SA (K-DUR,KLOR-CON) 20 MEQ tablet Take 1 tablet (20 mEq total) by mouth 3 (three) times daily.  90 tablet  11  . rosuvastatin (CRESTOR) 5 MG tablet Take 1 tablet (5 mg total) by mouth daily.  30 tablet  5  . senna-docusate (SENOKOT S) 8.6-50 MG per tablet Take 2 tablets by mouth at bedtime. For constipation, also available OTC  60 tablet  3  . warfarin (COUMADIN) 5 MG tablet Take as directed by coumadin clinic  30 tablet  3    Physical Exam: Blood pressure 141/54, pulse 79, temperature 97.9 F (36.6 C),  temperature source Oral, resp. rate 20, height 5\' 2"  (1.575 m), weight 195 lb 1.6 oz (88.497 kg), SpO2 100.00%.  General: NAD Neck: JVP 12-14 cm, no thyromegaly or thyroid nodule.  Lungs: Crackles at bases bilaterally.  CV: Nondisplaced PMI.  Heart regular S1/S2, no S3/S4, 2/6 SEM RUSB.  Trace ankle edema bilaterally.  No carotid bruit.  Normal pedal pulses.  Abdomen: Soft, nontender, no hepatosplenomegaly, no distention.  Skin: Intact without lesions or rashes.  Neurologic: Alert and oriented x 3.  Psych: Normal affect. Extremities: No clubbing or cyanosis.  HEENT: Normal.  MSK: Mild chest wall tenderness centrally.   Labs:   Lab Results  Component Value Date   WBC 14.3* 06/19/2014   HGB 13.6 06/19/2014   HCT 40.3 06/19/2014   MCV 95.0 06/19/2014   PLT 150 06/19/2014    Recent Labs Lab 06/19/14 1930  NA 144  K 3.6*  CL 101  CO2 28  BUN 24*  CREATININE 1.50*  CALCIUM 9.6  PROT 8.3  BILITOT 0.7  ALKPHOS 77  ALT 14  AST 23  GLUCOSE 140*   Lab Results  Component Value Date   CKTOTAL 38 04/06/2012   CKMB 1.8 04/06/2012   TROPONINI <0.30 06/19/2014  BNP 4805   Radiology: - CXR: Mild pulmonary edema.   EKG: atrial fibrillation, LBBB  ASSESSMENT AND PLAN: 73 yo with history of nonischemic cardiomyopathy, severe AI s/p AVR and ascending aorta replacement, and permanent atrial fibrillation presented with dyspnea.  1. Acute on chronic primarily diastolic CHF: Last echo 2/15 with EF 45-50%.  On exam today, she is volume overloaded with JVD and elevated BNP.  CXR suggestive of pulmonary edema.  - Lasix 60 mg IV every 8 hours.   - repeat echo - Continue bisoprolol and lisinopril.  2. Atrial fibrillation: Chronic.  Good rate control on bisoprolol.  Continue warfarin.  3. Bioprosthetic aortic valve: Will assess by echo.  4. CKD: Stable compared to prior.  Follow closely with diuresis.  5. HTN: Continue home meds including amlodipine.  6. Chest pain: Prolonged, negative enzymes.   Some chest wall tenderness. Will cycle troponin but suspect noncardiac.   Signed: [name] 06/20/2014, 12:11 AM

## 2014-06-21 ENCOUNTER — Encounter (HOSPITAL_COMMUNITY): Payer: Self-pay | Admitting: Cardiology

## 2014-06-21 DIAGNOSIS — I447 Left bundle-branch block, unspecified: Secondary | ICD-10-CM | POA: Diagnosis present

## 2014-06-21 DIAGNOSIS — Z7901 Long term (current) use of anticoagulants: Secondary | ICD-10-CM

## 2014-06-21 DIAGNOSIS — I428 Other cardiomyopathies: Secondary | ICD-10-CM | POA: Diagnosis present

## 2014-06-21 DIAGNOSIS — J209 Acute bronchitis, unspecified: Secondary | ICD-10-CM | POA: Diagnosis present

## 2014-06-21 LAB — BASIC METABOLIC PANEL
Anion gap: 15 (ref 5–15)
BUN: 29 mg/dL — AB (ref 6–23)
CALCIUM: 9 mg/dL (ref 8.4–10.5)
CO2: 28 meq/L (ref 19–32)
Chloride: 100 mEq/L (ref 96–112)
Creatinine, Ser: 1.31 mg/dL — ABNORMAL HIGH (ref 0.50–1.10)
GFR calc Af Amer: 46 mL/min — ABNORMAL LOW (ref >90)
GFR, EST NON AFRICAN AMERICAN: 40 mL/min — AB (ref >90)
GLUCOSE: 87 mg/dL (ref 70–99)
Potassium: 3.7 mEq/L (ref 3.7–5.3)
SODIUM: 143 meq/L (ref 137–147)

## 2014-06-21 LAB — PROTIME-INR
INR: 1.78 — ABNORMAL HIGH (ref 0.00–1.49)
PROTHROMBIN TIME: 20.7 s — AB (ref 11.6–15.2)

## 2014-06-21 MED ORDER — IPRATROPIUM-ALBUTEROL 0.5-2.5 (3) MG/3ML IN SOLN
3.0000 mL | Freq: Four times a day (QID) | RESPIRATORY_TRACT | Status: DC
  Administered 2014-06-21 – 2014-06-23 (×8): 3 mL via RESPIRATORY_TRACT
  Filled 2014-06-21 (×9): qty 3

## 2014-06-21 MED ORDER — WARFARIN SODIUM 5 MG PO TABS
5.0000 mg | ORAL_TABLET | Freq: Once | ORAL | Status: AC
  Administered 2014-06-21: 5 mg via ORAL
  Filled 2014-06-21: qty 1

## 2014-06-21 MED ORDER — FUROSEMIDE 40 MG PO TABS
60.0000 mg | ORAL_TABLET | Freq: Two times a day (BID) | ORAL | Status: DC
  Administered 2014-06-21 – 2014-06-22 (×3): 60 mg via ORAL
  Filled 2014-06-21 (×6): qty 1

## 2014-06-21 NOTE — Evaluation (Signed)
Physical Therapy Evaluation Patient Details Name: [name] MRN: [id number] DOB: [birth year] Today's Date: 06/21/2014   History of Present Illness  73 yo with history of nonischemic cardiomyopathy, severe AI s/p AVR and ascending aorta replacement, and permanent atrial fibrillation presented with dyspnea. Her symptoms have gradually worsened to the point where she was short of breath at rest today. She has also been having chest soreness that started Saturday, resolved after Lasix.In the ER, she was mildly hypoxic and hypertensive. CXR showed pulmonary edema. ECG showed LBBB similar to prior. Initial cardiac enzymes negative, BNP elevated. CHF exacerbation  Clinical Impression  Pt admitted with dyspnea and HTN. Pt currently with functional limitations due to the deficits listed below (see PT Problem List). Pt ambulated 200' with frequent rest breaks, O2 sats stable at 98% but pt still experiencing fatigue with activity. Pt will benefit from skilled PT to increase their independence and safety with mobility to allow discharge to the venue listed below. PT will continue to follow acutely, no PT recommended after d/c.      Follow Up Recommendations No PT follow up    Equipment Recommendations  None recommended by PT    Recommendations for Other Services       Precautions / Restrictions Precautions Precautions: None Restrictions Weight Bearing Restrictions: No      Mobility  Bed Mobility Overal bed mobility: Modified Independent                Transfers Overall transfer level: Modified independent Equipment used: None                Ambulation/Gait Ambulation/Gait assistance: Min guard Ambulation Distance (Feet): 200 Feet Assistive device: None Gait Pattern/deviations: Step-through pattern;Decreased step length - left;Decreased stance time - right Gait velocity: decreased Gait velocity interpretation: Below normal speed for age/gender General Gait Details: pt  needed standing rest break every 75' due to fatigue, O2 sats 98% HR 65 bpm. Occasional drifting right/ left but no full LOB  Stairs            Wheelchair Mobility    Modified Rankin (Stroke Patients Only)       Balance Overall balance assessment: Needs assistance Sitting-balance support: No upper extremity supported;Feet supported Sitting balance-Leahy Scale: Normal     Standing balance support: No upper extremity supported;During functional activity Standing balance-Leahy Scale: Good Standing balance comment: able to pick up object from floor but increased sway and unsteady with fatigue (previous h/o stroke with r sided weakness                             Pertinent Vitals/Pain Pain Assessment: No/denies pain    Home Living Family/patient expects to be discharged to:: Private residence Living Arrangements: Children Available Help at Discharge: Family;Available PRN/intermittently Type of Home: House Home Access: Level entry     Home Layout: One level Home Equipment: Walker - 4 wheels;Cane - single point;Bedside commode;Shower seat;Hand held shower head;Grab bars - tub/shower;Grab bars - toilet;Wheelchair - Engineer, technical sales - power;Adaptive equipment Additional Comments: Craftmatic bed    Prior Function Level of Independence: Independent         Comments: Drives     Hand Dominance   Dominant Hand: Left    Extremity/Trunk Assessment   Upper Extremity Assessment: Overall WFL for tasks assessed           Lower Extremity Assessment: Generalized weakness;RLE deficits/detail;LLE deficits/detail RLE Deficits / Details: pt fatigues with prolonged exertion  LLE Deficits / Details: same as RLE  Cervical / Trunk Assessment: Normal  Communication   Communication: No difficulties  Cognition Arousal/Alertness: Awake/alert Behavior During Therapy: WFL for tasks assessed/performed Overall Cognitive Status: History of cognitive impairments - at  baseline       Memory: Decreased short-term memory              General Comments General comments (skin integrity, edema, etc.): reviewed general exercise program that she did last time with HHPT and how to incorporate it into her daily routine because she does not enjoy going through exercises    Exercises        Assessment/Plan    PT Assessment Patient needs continued PT services  PT Diagnosis Difficulty walking;Generalized weakness   PT Problem List Decreased strength;Decreased activity tolerance;Decreased balance;Decreased mobility;Cardiopulmonary status limiting activity  PT Treatment Interventions Gait training;Functional mobility training;Therapeutic activities;Therapeutic exercise;Balance training;Patient/family education   PT Goals (Current goals can be found in the Care Plan section) Acute Rehab PT Goals Patient Stated Goal: return home PT Goal Formulation: With patient Time For Goal Achievement: 07/05/14 Potential to Achieve Goals: Good    Frequency Min 3X/week   Barriers to discharge        Co-evaluation               End of Session Equipment Utilized During Treatment: Gait belt Activity Tolerance: Patient tolerated treatment well Patient left: in bed;with call bell/phone within reach Nurse Communication: Mobility status         Time: 0865-7846 PT Time Calculation (min): 32 min   Charges:   PT Evaluation $Initial PT Evaluation Tier I: 1 Procedure PT Treatments $Gait Training: 23-37 mins   PT G Codes:        Lyanne Co, PT  Acute Rehab Services  [phone]   Lyanne Co 06/21/2014, 3:39 PM

## 2014-06-21 NOTE — Progress Notes (Signed)
ANTICOAGULATION CONSULT NOTE - Follow up  Pharmacy Consult for Coumadin Indication: AVR  Allergies  Allergen Reactions  . Adhesive [Tape] Itching, Rash and Other (See Comments)    Redness and skin breaks down  . Latex Rash  . Neomycin-Bacitracin Zn-Polymyx Rash    Patient Measurements: Height: 5\' 2"  (157.5 cm) Weight: 192 lb 3.2 oz (87.181 kg) (SCALE b) IBW/kg (Calculated) : 50.1  Vital Signs: Temp: 97.2 F (36.2 C) (09/23 0541) Temp src: Oral (09/23 0541) BP: 143/63 mmHg (09/23 0541) Pulse Rate: 64 (09/23 0541)  Labs:  Recent Labs  06/19/14 1930 06/20/14 0110 06/20/14 0304 06/20/14 1244 06/21/14 0335  HGB 13.6 12.5  --   --   --   HCT 40.3 38.0  --   --   --   PLT 150 147*  --   --   --   LABPROT 22.2*  --  22.6*  --  20.7*  INR 1.95*  --  1.99*  --  1.78*  CREATININE 1.50* 1.41*  --   --  1.31*  TROPONINI <0.30 <0.30  --  <0.30  --     Estimated Creatinine Clearance: 39.8 ml/min (by C-G formula based on Cr of 1.31).   Medical History: Past Medical History  Diagnosis Date  . History of ventricular tachycardia   . Peripheral edema   . Aortic insufficiency     s/p tissue AVR 2013  . History of gout   . Exogenous obesity   . History of CHF (congestive heart failure)   . 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 Hypertension   . 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 DOE (dyspnea on exertion)   . Stroke 2000  . Renal insufficiency   . Anxiety   . 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 Anemia   . Presence of IVC filter 2000  . Osteopenia 09/01/2013  . Coronary artery disease 2013    non obstructive  . Myocardial infarction 2000  . DVT (deep venous thrombosis)     Medications:  Prescriptions prior to admission  Medication Sig Dispense Refill  . acetaminophen (TYLENOL) 500 MG tablet Take 500 mg by mouth every 6 (six) hours as needed for moderate  pain.      [name] allopurinol (ZYLOPRIM) 100 MG tablet Take 1 tablet (100 mg total) by mouth 2 (two) times daily.  60 tablet  5  . amLODipine (NORVASC) 10 MG tablet Take 10 mg by mouth daily.      [name] aspirin EC 81 MG tablet Take 1 tablet (81 mg total) by mouth daily.  90 tablet  3  . Biotin 5000 MCG CAPS Take 1 capsule (5,000 mcg total) by mouth daily.  30 capsule  6  . bisoprolol (ZEBETA) 10 MG tablet Take 1 tablet (10 mg total) by mouth 2 (two) times daily.  60 tablet  6  . Calcium Carb-Cholecalciferol (CALCIUM-VITAMIN D) 600-400 MG-UNIT TABS Take 1 tablet by mouth daily.      . furosemide (LASIX) 40 MG tablet Take 40 mg by mouth 2 (two) times daily.      [name] lisinopril (PRINIVIL,ZESTRIL) 10 MG tablet Take 1 tablet (10 mg total) by mouth daily.  30 tablet  2  . OVER THE COUNTER MEDICATION Take 1 tablet by mouth daily as needed (for cough and cold). "[facility] Cough & Cold HBP"      . potassium chloride  SA (K-DUR,KLOR-CON) 20 MEQ tablet Take 1 tablet (20 mEq total) by mouth 3 (three) times daily.  90 tablet  11  . rosuvastatin (CRESTOR) 5 MG tablet Take 1 tablet (5 mg total) by mouth daily.  30 tablet  5  . senna-docusate (SENOKOT S) 8.6-50 MG per tablet Take 2 tablets by mouth at bedtime. For constipation, also available OTC  60 tablet  3  . warfarin (COUMADIN) 5 MG tablet Take 2.5-5 mg by mouth daily. Take 5 mg by mouth on Wednesday, then take 2.5 mg by mouth on all other days.        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  Her home dose was 2.5 mg daily except 5 mg qWed.  Today the INR is 1.78 down from 1.99.  Azithromycin started on 06/20/14 which may increase coumadin effect.  Skin: Ecchymosis. No active bleeding noted.     Goal of Therapy:  INR 2-3 Monitor platelets by anticoagulation protocol: Yes   Plan:  Coumadin 5 mg today.  Daily INR Discontinue SQ heparin when  INR =or >2.0  [name], RPh Clinical Pharmacist Pager: [phone] 06/21/2014,12:44 PM

## 2014-06-21 NOTE — Progress Notes (Signed)
    Subjective:  SOB improved but she is stil SOB with any exertion  Objective:  Vital Signs in the last 24 hours: Temp:  [97.2 F (36.2 C)-97.6 F (36.4 C)] 97.2 F (36.2 C) (09/23 0541) Pulse Rate:  [62-70] 64 (09/23 0541) Resp:  [18-20] 18 (09/23 0541) BP: (137-148)/(63-95) 143/63 mmHg (09/23 0541) SpO2:  [96 %-100 %] 96 % (09/23 0541) Weight:  [192 lb 3.2 oz (87.181 kg)] 192 lb 3.2 oz (87.181 kg) (09/23 0541)  Intake/Output from previous day:  Intake/Output Summary (Last 24 hours) at 06/21/14 0905 Last data filed at 06/21/14 0542  Gross per 24 hour  Intake    820 ml  Output   2125 ml  Net  -1305 ml    Physical Exam: General appearance: alert, cooperative, no distress and moderately obese Lungs: decreased breath sounds with expiratory wheezing Heart: irregularly irregular rhythm and 2/6 systolic murmur AOV Extremities: trace LE edema   Rate: 68  Rhythm: atrial fibrillation and LBBB  Lab Results:  Recent Labs  06/19/14 1930 06/20/14 0110  WBC 14.3* 11.1*  HGB 13.6 12.5  PLT 150 147*    Recent Labs  06/19/14 1930 06/20/14 0110 06/21/14 0335  NA 144  --  143  K 3.6*  --  3.7  CL 101  --  100  CO2 28  --  28  GLUCOSE 140*  --  87  BUN 24*  --  29*  CREATININE 1.50* 1.41* 1.31*    Recent Labs  06/20/14 0110 06/20/14 1244  TROPONINI <0.30 <0.30    Recent Labs  06/21/14 0335  INR 1.78*    Imaging: Imaging results have been reviewed  Cardiac Studies:  Assessment/Plan:  73 yo female with PMH of NICM transferred from [facility] for CHF felt to be primarily diastolic HF (last EF 45-50% Feb 2015) with superimposed bronchitis with bronchospasm.     Active Problems:   Acute on chronic diastolic CHF (congestive heart failure)   H/O tissue AVR and AO root replacement 2013   CKD (chronic kidney disease), stage III   Chronic atrial fibrillation   NICM- EF 20% 2013- 45-50% Feb 2015   Bronchitis with bronchospasm   Back pain- s/p recent  steroid dose pack   S/P implantation of automatic cardioverter/defibrillator (AICD)   LBBB (left bundle branch block)   Chronic anticoagulation- Coumadin   CHF exacerbation    PLAN: Continue IV diuretics, inhalers,  and course of antibiotics.  Corine Shelter PA-C Beeper [phone] 06/21/2014, 9:05 AM   Patient seen and examined and history reviewed. Agree with above findings and plan. She is doing better. Still SOB. Much less wheezing on exam. She can tell that nebulizers really help. Good diuresis. Weight is down. Will switch lasix to po. Continue nebulizer therapy.  Wendelyn Kiesling [place], MDFACC 06/21/2014 1:52 PM

## 2014-06-22 DIAGNOSIS — Z7901 Long term (current) use of anticoagulants: Secondary | ICD-10-CM

## 2014-06-22 LAB — BASIC METABOLIC PANEL
Anion gap: 12 (ref 5–15)
BUN: 36 mg/dL — ABNORMAL HIGH (ref 6–23)
CHLORIDE: 102 meq/L (ref 96–112)
CO2: 29 mEq/L (ref 19–32)
CREATININE: 1.51 mg/dL — AB (ref 0.50–1.10)
Calcium: 9.2 mg/dL (ref 8.4–10.5)
GFR calc non Af Amer: 33 mL/min — ABNORMAL LOW (ref >90)
GFR, EST AFRICAN AMERICAN: 39 mL/min — AB (ref >90)
Glucose, Bld: 85 mg/dL (ref 70–99)
POTASSIUM: 3.9 meq/L (ref 3.7–5.3)
Sodium: 143 mEq/L (ref 137–147)

## 2014-06-22 LAB — GLUCOSE, CAPILLARY: GLUCOSE-CAPILLARY: 91 mg/dL (ref 70–99)

## 2014-06-22 LAB — PROTIME-INR
INR: 1.62 — AB (ref 0.00–1.49)
Prothrombin Time: 19.2 seconds — ABNORMAL HIGH (ref 11.6–15.2)

## 2014-06-22 MED ORDER — WARFARIN SODIUM 3 MG PO TABS
3.0000 mg | ORAL_TABLET | Freq: Once | ORAL | Status: AC
  Administered 2014-06-22: 3 mg via ORAL
  Filled 2014-06-22: qty 1

## 2014-06-22 MED ORDER — PREDNISONE 50 MG PO TABS
60.0000 mg | ORAL_TABLET | Freq: Every day | ORAL | Status: DC
  Administered 2014-06-22 – 2014-06-23 (×2): 60 mg via ORAL
  Filled 2014-06-22 (×3): qty 1

## 2014-06-22 NOTE — Progress Notes (Signed)
ANTICOAGULATION CONSULT NOTE - Follow up  Pharmacy Consult for Coumadin Indication: AVR  Allergies  Allergen Reactions  . Adhesive [Tape] Itching, Rash and Other (See Comments)    Redness and skin breaks down  . Latex Rash  . Neomycin-Bacitracin Zn-Polymyx Rash    Patient Measurements: Height:  (157.5 cm) Weight: 192 lb 3.9 oz (87.2 kg) IBW/kg (Calculated) : 50.1  Vital Signs: Temp: 97.1 F (36.2 C) (09/24 0945) Temp src: Oral (09/24 0945) BP: 97/70 mmHg (09/24 0945) Pulse Rate: 69 (09/24 0945)  Labs:  Recent Labs  06/19/14 1930 06/20/14 0110 06/20/14 0304 06/20/14 1244 06/21/14 0335 06/22/14 0538  HGB 13.6 12.5  --   --   --   --   HCT 40.3 38.0  --   --   --   --   PLT 150 147*  --   --   --   --   LABPROT 22.2*  --  22.6*  --  20.7* 19.2*  INR 1.95*  --  1.99*  --  1.78* 1.62*  CREATININE 1.50* 1.41*  --   --  1.31* 1.51*  TROPONINI <0.30 <0.30  --  <0.30  --   --     Estimated Creatinine Clearance: 34.5 ml/min (by C-G formula based on Cr of 1.51).   Medical History: Past Medical History  Diagnosis Date  . History of ventricular tachycardia   . Peripheral edema   . Aortic insufficiency     s/p tissue AVR 2013  . History of gout   . Exogenous obesity   . History of CHF (congestive heart failure)   . 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 Hypertension   . 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 DOE (dyspnea on exertion)   . Stroke 2000  . Renal insufficiency   . Anxiety   . 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 Anemia   . Presence of IVC filter 2000  . Osteopenia 09/01/2013  . Coronary artery disease 2013    non obstructive  . Myocardial infarction 2000  . DVT (deep venous thrombosis)     Medications:  Prescriptions prior to admission  Medication Sig Dispense Refill  . acetaminophen (TYLENOL) 500 MG tablet Take 500 mg by mouth  every 6 (six) hours as needed for moderate pain.      [name] allopurinol (ZYLOPRIM) 100 MG tablet Take 1 tablet (100 mg total) by mouth 2 (two) times daily.  60 tablet  5  . amLODipine (NORVASC) 10 MG tablet Take 10 mg by mouth daily.      [name] aspirin EC 81 MG tablet Take 1 tablet (81 mg total) by mouth daily.  90 tablet  3  . Biotin 5000 MCG CAPS Take 1 capsule (5,000 mcg total) by mouth daily.  30 capsule  6  . bisoprolol (ZEBETA) 10 MG tablet Take 1 tablet (10 mg total) by mouth 2 (two) times daily.  60 tablet  6  . Calcium Carb-Cholecalciferol (CALCIUM-VITAMIN D) 600-400 MG-UNIT TABS Take 1 tablet by mouth daily.      . furosemide (LASIX) 40 MG tablet Take 40 mg by mouth 2 (two) times daily.      [name] lisinopril (PRINIVIL,ZESTRIL) 10 MG tablet Take 1 tablet (10 mg total) by mouth daily.  30 tablet  2  . OVER THE COUNTER MEDICATION Take 1 tablet by mouth daily as needed (for cough  and cold). "[facility] Cough & Cold HBP"      . potassium chloride SA (K-DUR,KLOR-CON) 20 MEQ tablet Take 1 tablet (20 mEq total) by mouth 3 (three) times daily.  90 tablet  11  . rosuvastatin (CRESTOR) 5 MG tablet Take 1 tablet (5 mg total) by mouth daily.  30 tablet  5  . senna-docusate (SENOKOT S) 8.6-50 MG per tablet Take 2 tablets by mouth at bedtime. For constipation, also available OTC  60 tablet  3  . warfarin (COUMADIN) 5 MG tablet Take 2.5-5 mg by mouth daily. Take 5 mg by mouth on Wednesday, then take 2.5 mg by mouth on all other days.        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  Her home dose was 2.5 mg daily except 5 mg qWed.    Today the INR is 1.62 down from 1.78 <<1.99.  We have been giving her usual home dosage yet INR has decreased. May have missed some doses prior to admit per the medication reconciliation report that last dose taken PTA was within "past week".  Azithromycin started  06/20/14 which can increase coumadin effect, but have not seen an increase effect after 2 doses of azithromycin.  I am hesitant to increase coumadin dose as I expect INR may bump up by tomorrow due to azithromycin and we have not seen effect of yesteday's 5mg  coumadin yet.  Continues on SQ heparin for DVT prophylaxis while INR subtherapeutic. Skin: Ecchymosis. No active bleeding noted.   Goal of Therapy:  INR 2-3 Monitor platelets by anticoagulation protocol: Yes   Plan:  Coumadin 3 mg today.  Daily INR Discontinue SQ heparin when INR =or >2.0  [name], RPh Clinical Pharmacist Pager: [phone] 06/22/2014,12:42 PM

## 2014-06-22 NOTE — Progress Notes (Signed)
    Subjective:  Up in chair. She is improving, still has significant DOA.  Objective:  Vital Signs in the last 24 hours: Temp:  [97.4 F (36.3 C)-98.3 F (36.8 C)] 97.5 F (36.4 C) (09/24 0629) Pulse Rate:  [68-79] 68 (09/24 0629) Resp:  [14-18] 17 (09/24 0629) BP: (95-138)/(63-85) 95/71 mmHg (09/24 0629) SpO2:  [95 %-100 %] 95 % (09/24 0807) Weight:  [192 lb 3.9 oz (87.2 kg)] 192 lb 3.9 oz (87.2 kg) (09/24 0629)  Intake/Output from previous day:  Intake/Output Summary (Last 24 hours) at 06/22/14 0909 Last data filed at 06/21/14 2044  Gross per 24 hour  Intake    960 ml  Output    325 ml  Net    635 ml    Physical Exam: General appearance: alert, cooperative, no distress and moderately obese Lungs: expiratory wheezing bilat Heart: irregularly irregular rhythm and 2/6 systolic murmur Extremities: painful varicosities   Rate: 70  Rhythm: atrial fibrillation  Lab Results:  Recent Labs  06/19/14 1930 06/20/14 0110  WBC 14.3* 11.1*  HGB 13.6 12.5  PLT 150 147*    Recent Labs  06/21/14 0335 06/22/14 0538  NA 143 143  K 3.7 3.9  CL 100 102  CO2 28 29  GLUCOSE 87 85  BUN 29* 36*  CREATININE 1.31* 1.51*    Recent Labs  06/20/14 0110 06/20/14 1244  TROPONINI <0.30 <0.30    Recent Labs  06/22/14 0538  INR 1.62*    Imaging: Imaging results have been reviewed  Cardiac Studies:  Assessment/Plan:  73 yo female with PMH of NICM, tissue AVR and aortic root replacement 2013, CAF on Coumadin, stage 3 CRI, transferred from [facility] for diastolic HF (last EF 45-50% Feb 2015) with superimposed bronchitis with bronchospasm.    Active Problems:   Acute on chronic diastolic CHF (congestive heart failure)   H/O tissue AVR and AO root replacement 2013   CKD (chronic kidney disease), stage III   Chronic atrial fibrillation   NICM- EF 20% 2013- 45-50% Feb 2015   Bronchitis with bronchospasm   Back pain- s/p recent steroid dose pack   S/P implantation  of automatic cardioverter/defibrillator (AICD)   LBBB (left bundle branch block)   Chronic anticoagulation- Coumadin    PLAN: She has diuresed 4 lbs since admission. Now on PO Lasix. She still has significant wheezing on exam. ? Pulmonary consult before discharge. Consider discharge home in am when her family returns from vacation if she continues to improve.   Corine Shelter PA-C Beeper [phone] 06/22/2014, 9:09 AM  Patient seen and examined and history reviewed. Agree with above findings and plan. Patient states she is improved but she still has wheezing on exam. No edema. I think she is euvolemic. Will start oral steroids today. Continue nebulizers, antibiotics, oral lasix.  Peter [place], MDFACC 06/22/2014 10:57 AM

## 2014-06-23 ENCOUNTER — Telehealth: Payer: Self-pay | Admitting: Family

## 2014-06-23 LAB — BASIC METABOLIC PANEL
Anion gap: 19 — ABNORMAL HIGH (ref 5–15)
BUN: 43 mg/dL — ABNORMAL HIGH (ref 6–23)
CALCIUM: 9.9 mg/dL (ref 8.4–10.5)
CHLORIDE: 100 meq/L (ref 96–112)
CO2: 25 meq/L (ref 19–32)
Creatinine, Ser: 1.69 mg/dL — ABNORMAL HIGH (ref 0.50–1.10)
GFR calc Af Amer: 34 mL/min — ABNORMAL LOW (ref >90)
GFR calc non Af Amer: 29 mL/min — ABNORMAL LOW (ref >90)
Glucose, Bld: 137 mg/dL — ABNORMAL HIGH (ref 70–99)
Potassium: 5.1 mEq/L (ref 3.7–5.3)
SODIUM: 144 meq/L (ref 137–147)

## 2014-06-23 LAB — PROTIME-INR
INR: 1.59 — ABNORMAL HIGH (ref 0.00–1.49)
PROTHROMBIN TIME: 19 s — AB (ref 11.6–15.2)

## 2014-06-23 MED ORDER — MOMETASONE FURO-FORMOTEROL FUM 100-5 MCG/ACT IN AERO: 2.0000 | INHALATION_SPRAY | Freq: Two times a day (BID) | RESPIRATORY_TRACT | Status: DC

## 2014-06-23 MED ORDER — WARFARIN SODIUM 5 MG PO TABS
5.0000 mg | ORAL_TABLET | Freq: Every day | ORAL | Status: DC
  Filled 2014-06-23: qty 1

## 2014-06-23 MED ORDER — FUROSEMIDE 40 MG PO TABS
40.0000 mg | ORAL_TABLET | Freq: Two times a day (BID) | ORAL | Status: DC
  Filled 2014-06-23 (×2): qty 1

## 2014-06-23 MED ORDER — IPRATROPIUM-ALBUTEROL 20-100 MCG/ACT IN AERS: 1.0000 | INHALATION_SPRAY | Freq: Four times a day (QID) | RESPIRATORY_TRACT | Status: DC

## 2014-06-23 MED ORDER — PREDNISONE (PAK) 10 MG PO TABS: ORAL_TABLET | Freq: Every day | ORAL | Status: DC

## 2014-06-23 MED ORDER — WARFARIN SODIUM 5 MG PO TABS: 5.0000 mg | ORAL_TABLET | ORAL | Status: DC

## 2014-06-23 MED ORDER — AZITHROMYCIN 250 MG PO TABS: ORAL_TABLET | ORAL | Status: DC

## 2014-06-23 MED ORDER — AMLODIPINE BESYLATE 5 MG PO TABS: 5.0000 mg | ORAL_TABLET | Freq: Every day | ORAL | Status: DC

## 2014-06-23 MED ORDER — WARFARIN SODIUM 2.5 MG PO TABS: 2.5000 mg | ORAL_TABLET | ORAL | Status: DC

## 2014-06-23 MED ORDER — IPRATROPIUM-ALBUTEROL 0.5-2.5 (3) MG/3ML IN SOLN
3.0000 mL | Freq: Four times a day (QID) | RESPIRATORY_TRACT | Status: DC
  Filled 2014-06-23: qty 3

## 2014-06-23 NOTE — Telephone Encounter (Signed)
Please contact pt to arrange hospital follow up. 

## 2014-06-23 NOTE — Discharge Summary (Signed)
Discharge Summary   Patient ID: [name],  MRN: [id number], DOB/AGE: 12/30/1940 73 y.o.  Admit date: 06/19/2014 Discharge date: 06/23/2014  Primary Care Provider: O'SULLIVAN,MELISSA S. Primary Cardiologist: Dr. Patty Sermons  Discharge Diagnoses Principal Problem:   Acute on chronic diastolic CHF (congestive heart failure)  - also contributed with bronchospasm  - Echo 06/20/2014 EF 50-55% (up from 45-50% from Feb 2015) Active Problems:   Back pain- s/p recent steroid dose pack   H/O tissue AVR and AO root replacement 2013   CKD (chronic kidney disease), stage III   Chronic atrial fibrillation   S/P implantation of automatic cardioverter/defibrillator (AICD)   NICM- EF 20% 2013- 45-50% Feb 2015   LBBB (left bundle branch block)   Chronic anticoagulation- Coumadin   Bronchitis with bronchospasm   Allergies Allergies  Allergen Reactions  . Adhesive [Tape] Itching, Rash and Other (See Comments)    Redness and skin breaks down  . Latex Rash  . Neomycin-Bacitracin Zn-Polymyx Rash    Procedures  Echocardiogram 06/20/2014 LV EF: 50% - 55%  ------------------------------------------------------------------- Indications: CHF - 428.0.  ------------------------------------------------------------------- History: PMH: Atrial fibrillation. Non-ischemic cardiomyopathy. Risk factors: Hypertension. Dyslipidemia.  ------------------------------------------------------------------- Study Conclusions  - Left ventricle: The cavity size was normal. Wall thickness was increased in a pattern of moderate LVH. Systolic function was normal. The estimated ejection fraction was in the range of 50% to 55%. Wall motion was normal; there were no regional wall motion abnormalities. Doppler parameters are consistent with high ventricular filling pressure. - Aortic valve: A bioprosthesis was present. Peak velocity (S): 278 cm/s. - Aorta: Aortic root dimension: 43 mm at the sinus of  Valsalva, 35mm ascending root (ED). - Mitral valve: Calcified annulus. - Left atrium: The atrium was moderately dilated. - Right ventricle: The cavity size was mildly dilated. Wall thickness was normal. - Pulmonary arteries: Systolic pressure was mildly increased. PA peak pressure: 40 mm Hg (S).  Impressions:  - Mildly improved EF when compared to prior.      Hospital Course  The patient is a 73 year old female with past medical history significant for hypertension, ED with history of IVC filter, severe aortic insufficiency status post bioprosthetic AVR with LAA clipping, s/p descending aorta replacement with chemograft, PAD, history of chronic kidney disease and history of atrial fibrillation on Coumadin. Patient presented to [facility] on 06/19/2014 with progressively increasing shortness of breath over the last week. Interestingly, patient has been compliant with her medication, fluid restriction and sodium restriction at home.   On initial admission, her pro BNP was elevated at 4000. Serial troponin was negative. Chest x-ray showed very mild interstitial pulmonary edema. She was admitted to cardiology service and was started on 80 mg IV Lasix every 8 hours. Her previous echo on 11/05/2013 showed EF 45-50%. Echocardiogram was repeated on 06/20/2014 which showed EF 50-55%, no regional wall motion abnormality, moderate LVH, bioprosthetic AV valve present, PA peak pressure 40 mm mercury. When compared to the previous echo, there has been a mild improvement in EF. She was seen the morning of 9/22 at which time pulmonary exam revealed mild expiratory wheezing however no significant rale after IV Lasix. It was felt some of her symptom may be related to bronchitis with bronchospasm on top of acute congestive heart failure. However patient was recently came off of steroid pack for back pain. We have restarted the patient on steroid therapy, breathing treatment and a course of Z-Pak.   She was seen in  the morning of 06/23/2014, she states she  is breathing better, however pulmonary exam continued to reveal wheezing. His daughter have made an appointment this patient's pulmonologist Dr. Kendrick Fries recently for recurrent acute bronchospasm with URI. She is deemed stable for discharge from cardiology perspective. She has lost 4 pounds with diaphoresis and is now back on her 40 mg twice a day of home Lasix. We will taper her steroids over the next week down to 10 mg until she sees Dr. Kendrick Fries. Family also have made an appointment with cardiology clinic on 07/10/2014. She has a followup with Coumadin clinic on 06/28/2014.  Per pharmacy recommendation, patient will take 5 mg Coumadin on 9/25 and 9/26 and then resume her previous Coumadin schedule. I have also prescribed one tablet of azithromycin for the patient to finish her course of 5 day Z-Pak and also a tapering dose of prednisone. Patient was started on 60 mg per day decrease by 10 mg every single day until she reached 10 mg daily dose. I will defer to the patient's primary pulmonologist to adjust the steroid dose on followup. I have also given the patient 1 inhaler of Dulera in the meantime.   Of note, during this hospitalization, patient's amlodipine has decreased from 10 mg to 5 mg.    Discharge Vitals Blood pressure 123/67, pulse 91, temperature 97.9 F (36.6 C), temperature source Oral, resp. rate 17, height 5\' 2"  (1.575 m), weight 192 lb 4.8 oz (87.227 kg), SpO2 97.00%.  Filed Weights   06/21/14 0541 06/22/14 0629 06/23/14 0610  Weight: 192 lb 3.2 oz (87.181 kg) 192 lb 3.9 oz (87.2 kg) 192 lb 4.8 oz (87.227 kg)    Labs  Basic Metabolic Panel  Recent Labs  06/22/14 0538 06/23/14 0417  NA 143 144  K 3.9 5.1  CL 102 100  CO2 29 25  GLUCOSE 85 137*  BUN 36* 43*  CREATININE 1.51* 1.69*  CALCIUM 9.2 9.9   Cardiac Enzymes  Recent Labs  06/20/14 1244  TROPONINI <0.30    Disposition  Pt is being discharged home today in good  condition.  Follow-up Plans & Appointments      Follow-up Information   Follow up with [facility] On 06/28/2014. (3:45pm)    Contact information:   [address] [place] [address]       Follow up with Max Fickle, MD On 07/03/2014. (Follow up with Dr. Kendrick Fries regarding steroid dosing and duration)    Specialty:  Pulmonary Disease   Contact information:   [address] [place] [address] [phone]       Follow up with [facility] On 07/06/2014. (defibilator check. 10:30am)       Follow up with Norma Fredrickson, NP On 07/11/2014. (10:30am)    Specialty:  Nurse Practitioner   Contact information:   [address] [place] [address] [phone]       Follow up with [name], MD On 09/18/2014. (Obtain labs at 11:30 and follow up with Dr. Tally Due at 11:45am)    Specialty:  Cardiology   Contact information:   [address] [place] [address] [phone]       Discharge Medications    Medication List         acetaminophen 500 MG tablet  Commonly known as:  TYLENOL  Take 500 mg by mouth every 6 (six) hours as needed for moderate pain.     allopurinol 100 MG tablet  Commonly known as:  ZYLOPRIM  Take  1 tablet (100 mg total) by mouth 2 (two) times daily.     amLODipine 5 MG tablet  Commonly known as:  NORVASC  Take 1 tablet (5 mg total) by mouth daily.     aspirin EC 81 MG tablet  Take 1 tablet (81 mg total) by mouth daily.     azithromycin 250 MG tablet  Commonly known as:  ZITHROMAX  Have finished 4/5 days of azithromycin in hospital, will need 1 more dose on 9/26     Biotin 5000 MCG Caps  Take 1 capsule (5,000 mcg total) by mouth daily.     bisoprolol 10 MG tablet  Commonly known as:  ZEBETA  Take 1 tablet (10 mg total) by mouth 2 (two) times daily.     Calcium-Vitamin D 600-400 MG-UNIT Tabs  Take 1 tablet by mouth daily.     furosemide 40 MG tablet  Commonly known  as:  LASIX  Take 40 mg by mouth 2 (two) times daily.     lisinopril 10 MG tablet  Commonly known as:  PRINIVIL,ZESTRIL  Take 1 tablet (10 mg total) by mouth daily.     mometasone-formoterol 100-5 MCG/ACT Aero  Commonly known as:  DULERA  Inhale 2 puffs into the lungs 2 (two) times daily.     OVER THE COUNTER MEDICATION  Take 1 tablet by mouth daily as needed (for cough and cold). "[facility] Cough & Cold HBP"     potassium chloride SA 20 MEQ tablet  Commonly known as:  K-DUR,KLOR-CON  Take 1 tablet (20 mEq total) by mouth 3 (three) times daily.     predniSONE 10 MG tablet  Commonly known as:  STERAPRED UNI-PAK  Take by mouth daily. Started at  (6 tablet) on 9/26, then  (5 tabs) the next day, then 40, 30, 20, , then continue on      rosuvastatin 5 MG tablet  Commonly known as:  CRESTOR  Take 1 tablet (5 mg total) by mouth daily.     senna-docusate 8.6-50 MG per tablet  Commonly known as:  SENOKOT S  Take 2 tablets by mouth at bedtime. For constipation, also available OTC     warfarin 5 MG tablet  Commonly known as:  COUMADIN  Take 2.5-5 mg by mouth daily. Take 5 mg by mouth on Wednesday, then take 2.5 mg by mouth on all other days.        Outstanding Labs/Studies  Follow up with Coumadin clinic as scheduled above  Duration of Discharge Encounter   Greater than 30 minutes including physician time.  Ramond Dial PA-C Pager: [phone] 06/23/2014, 11:44 AM

## 2014-06-23 NOTE — Discharge Summary (Signed)
Patient seen and examined and history reviewed. Agree with above findings and plan. See earlier rounding note.  Peter [place], MDFACC 06/23/2014 1:50 PM

## 2014-06-23 NOTE — Telephone Encounter (Signed)
Caller name: Bonita Quin Relation to pt: self  Call back number: [phone]   Reason for call: pt grandaughter would like to discuss

## 2014-06-23 NOTE — Progress Notes (Signed)
Patient is sleeping peacefully. Denies c/o pain/discomfort at present time. Maintained on telemetry and is in A-fib. Will continue to monitor.  Sharlene Dory, RN

## 2014-06-23 NOTE — Progress Notes (Signed)
ANTICOAGULATION CONSULT NOTE - Follow up  Pharmacy Consult for Coumadin Indication: AVR  Allergies  Allergen Reactions  . Adhesive [Tape] Itching, Rash and Other (See Comments)    Redness and skin breaks down  . Latex Rash  . Neomycin-Bacitracin Zn-Polymyx Rash    Patient Measurements: Height: 5\' 2"  (157.5 cm) Weight: 192 lb 4.8 oz (87.227 kg) (b scale) IBW/kg (Calculated) : 50.1  Vital Signs: Temp: 97.9 F (36.6 C) (09/25 0610) Temp src: Oral (09/25 0610) BP: 123/67 mmHg (09/25 0610) Pulse Rate: 91 (09/25 0610)  Labs:  Recent Labs  06/20/14 1244 06/21/14 0335 06/22/14 0538 06/23/14 0417  LABPROT  --  20.7* 19.2* 19.0*  INR  --  1.78* 1.62* 1.59*  CREATININE  --  1.31* 1.51* 1.69*  TROPONINI <0.30  --   --   --     Estimated Creatinine Clearance: 30.8 ml/min (by C-G formula based on Cr of 1.69).     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  Her home dose was 2.5 mg daily except 5 mg qWed.    Today the INR is 1.59 after 2.5mg >5 mg>3mg  doses.  She may have missed some doses prior to admit per the medication reconciliation report that last dose taken PTA was within "past week".  Azithromycin started 06/20/14 which can increase coumadin effect, but have not seen an increase effect after 2 doses of azithromycin.   Continues on SQ heparin for DVT prophylaxis while INR subtherapeutic. Ecchymosis. No active bleeding noted.   Goal of Therapy:  INR 2-3 Monitor platelets by anticoagulation protocol: Yes   Plan:  Coumadin 5 mg today and tomorrow, then back to home dose of 2.5 mg daily X 5 mg on Wednesday To get INR checked in clinic next week  Herby Abraham, Pharm.D. [phone] 06/23/2014 10:17 AM

## 2014-06-23 NOTE — Progress Notes (Signed)
    Subjective:  In very good spirits. States breathing better. Still wheezing.  Objective:  Vital Signs in the last 24 hours: Temp:  [97.4 F (36.3 C)-98 F (36.7 C)] 97.9 F (36.6 C) (09/25 0610) Pulse Rate:  [71-91] 91 (09/25 0610) Resp:  [17-18] 17 (09/25 0610) BP: (123-144)/(67-84) 123/67 mmHg (09/25 0610) SpO2:  [94 %-99 %] 97 % (09/25 0836) Weight:  [192 lb 4.8 oz (87.227 kg)] 192 lb 4.8 oz (87.227 kg) (09/25 0610)  Intake/Output from previous day:  Intake/Output Summary (Last 24 hours) at 06/23/14 1013 Last data filed at 06/23/14 1001  Gross per 24 hour  Intake    480 ml  Output   1500 ml  Net  -1020 ml    Physical Exam: General appearance: alert, cooperative, no distress and moderately obese Lungs: expiratory wheezing bilat.  Heart: irregularly irregular rhythm and 2/6 systolic murmur Extremities:  varicosities   Rate: 90s  Rhythm: atrial fibrillation  Lab Results: No results found for this basename: WBC, HGB, PLT,  in the last 72 hours  Recent Labs  06/22/14 0538 06/23/14 0417  NA 143 144  K 3.9 5.1  CL 102 100  CO2 29 25  GLUCOSE 85 137*  BUN 36* 43*  CREATININE 1.51* 1.69*    Recent Labs  06/20/14 1244  TROPONINI <0.30    Recent Labs  06/23/14 0417  INR 1.59*    Imaging: Imaging results have been reviewed  Cardiac Studies:  Assessment/Plan:  73 yo female with PMH of NICM, tissue AVR and aortic root replacement 2013, CAF on Coumadin, stage 3 CRI, transferred from [facility] for diastolic HF (last EF 45-50% Feb 2015) with superimposed bronchitis with bronchospasm.    Active Problems:   Back pain- s/p recent steroid dose pack   H/O tissue AVR and AO root replacement 2013   CKD (chronic kidney disease), stage III   Chronic atrial fibrillation   S/P implantation of automatic cardioverter/defibrillator (AICD)   Acute on chronic diastolic CHF (congestive heart failure)   NICM- EF 20% 2013- 45-50% Feb 2015   LBBB (left bundle  branch block)   Chronic anticoagulation- Coumadin   Bronchitis with bronchospasm    PLAN: She has diuresed 4 lbs since admission. Now on usual PO Lasix. Weight stable. She still has significant wheezing on exam. Started steroids yesterday. Would taper to 10 mg over the next week. Family made an appointment with pulmonary (Dr. Kendrick Fries) due to recurrent acute bronchospasm with URI.  Will continue Duoneb and Combivent inhalers. Has appointment with Norma Fredrickson on 07/06/14. Will need follow up INR next week.   Peter [place], MDFACC 06/23/2014 10:13 AM

## 2014-06-23 NOTE — Discharge Instructions (Signed)
Acute Respiratory Failure °Respiratory failure is when your lungs are not working well and your breathing (respiratory) system fails. When respiratory failure occurs, it is difficult for your lungs to get enough oxygen, get rid of carbon dioxide, or both. Respiratory failure can be life threatening.  °Respiratory failure can be acute or chronic. Acute respiratory failure is sudden, severe, and requires emergency medical treatment. Chronic respiratory failure is less severe, happens over time, and requires ongoing treatment.  °WHAT ARE THE CAUSES OF ACUTE RESPIRATORY FAILURE?  °Any problem affecting the heart or lungs can cause acute respiratory failure. Some of these causes include the following: °· Chronic bronchitis and emphysema (COPD).   °· Blood clot going to a lung (pulmonary embolism).   °· Having water in the lungs caused by heart failure, lung injury, or infection (pulmonary edema).   °· Collapsed lung (pneumothorax).   °· Pneumonia.   °· Pulmonary fibrosis.   °· Obesity.   °· Asthma.   °· Heart failure.   °· Any type of trauma to the chest that can make breathing difficult.   °· Nerve or muscle diseases making chest movements difficult. °WHAT SYMPTOMS SHOULD YOU WATCH FOR?  °If you have any of these signs or symptoms, you should seek immediate medical care:  °· You have shortness of breath (dyspnea) with or without activity.   °· You have rapid, fast breathing (tachypnea).   °· You are wheezing. °· You are unable to say more than a few words without having to catch your breath. °· You find it very difficult to function normally. °· You have a fast heart rate.   °· You have a bluish color to your finger or toe nail beds.   °· You have confusion or drowsiness or both.   °HOW WILL MY ACUTE RESPIRATORY FAILURE BE TREATED?  °Treatment of acute respiratory failure depends on the cause of the respiratory failure. Usually, you will stay in the intensive care unit so your breathing can be watched closely. Treatment  can include the following: °· Oxygen. Oxygen can be delivered through the following: °¨ Nasal cannula. This is small tubing that goes in your nose to give you oxygen. °¨ Face mask. A face mask covers your nose and mouth to give you oxygen. °· Medicine. Different medicines can be given to help with breathing. These can include: °¨ Nebulizers. Nebulizers deliver medicines to open the air passages (bronchodilators). These medicines help to open or relax the airways in the lungs so you can breathe better. They can also help loosen mucus from your lungs. °¨ Diuretics. Diuretic medicines can help you breathe better by getting rid of extra water in your body. °¨ Steroids. Steroid medicines can help decrease swelling (inflammation) in your lungs. °¨ Antibiotics. °· Chest tube. If you have a collapsed lung (pneumothorax), a chest tube is placed to help reinflate the lung. °· Non-invasive positive pressure ventilation (NPPV). This is a tight-fitting mask that goes over your nose and mouth. The mask has tubing that is attached to a machine. The machine blows air into the tubing, which helps to keep the tiny air sacs (alveoli) in your lungs open. This machine allows you to breathe on your own. °· Ventilator. A ventilator is a breathing machine. When on a ventilator, a breathing tube is put into the lungs. A ventilator is used when you can no longer breathe well enough on your own. You may have low oxygen levels or high carbon dioxide (CO2) levels in your blood. When you are on a ventilator, sedation and pain medicines are given to make you sleep   so your lungs can heal. Document Released: 09/20/2013 Document Revised: 01/30/2014 Document Reviewed: 09/20/2013 [facility] Patient Information 2015 [place], [place]. This information is not intended to replace advice given to you by your health care provider. Make sure you discuss any questions you have with your health care provider.   Please take 5mg  Coumadin on the night of 9/25  and 9/26, then resume your previous dosing schedule

## 2014-06-23 NOTE — Progress Notes (Signed)
PT Cancellation Note  Patient Details Name: [name] MRN: [id number] DOB: 1941-08-16   Cancelled Treatment:    Reason Eval/Treat Not Completed: Other (comment) (pt to d/c, no needs today)   [facility] 06/23/2014, 12:45 PM

## 2014-06-26 NOTE — Telephone Encounter (Signed)
Admit date: 06/19/2014  Discharge date: 06/23/2014  Reason for Admission:  Acute on chronic diastolic CHF (congestive heart failure) with bronchospasm   Transition Care Management Follow-up Telephone Call  How have you been since you were released from the hospital?  Pt is doing better.  She did fall out of her bath tub on Saturday, 06/24/14, but did not sustain any injuries. She does have multiple bruises, but no bleeding or pain.  She denies shortness of breath, wheezing, chest pain, palpitations, or swelling.  She continues to have a slight cough.     Do you understand why you were in the hospital? yes   Do you understand the discharge instructions? No, but daughter did and has taken care of everything.    Items Reviewed:  Medications reviewed: yes  Allergies reviewed: yes  Dietary changes reviewed: yes  Referrals reviewed: yes   Functional Questionnaire:   Activities of Daily Living (ADLs):   She states they are independent in the following: ambulation, bathing and hygiene, feeding, continence, grooming, toileting and dressing States they require assistance with the following: setting up medications   Any transportation issues/concerns?: no   Any patient concerns? no   Confirmed importance and date/time of follow-up visits scheduled: Specialist appointments scheduled.  At this time, pt does not want to schedule hospital follow up with primary care provider.  Melissa O'sullivan made aware.  She stated that she was okay with patient's decision.     Confirmed with patient if condition begins to worsen call PCP or go to the ER.  Patient was given the Call-a-Nurse line [phone]: yes, number was not given.  Pt stated that she was in the bed and did not have pen and paper.

## 2014-06-28 ENCOUNTER — Ambulatory Visit: Payer: Medicare Other | Admitting: Family

## 2014-07-03 ENCOUNTER — Ambulatory Visit (INDEPENDENT_AMBULATORY_CARE_PROVIDER_SITE_OTHER): Payer: Medicare Other | Admitting: Pulmonary Disease

## 2014-07-03 ENCOUNTER — Encounter (INDEPENDENT_AMBULATORY_CARE_PROVIDER_SITE_OTHER): Payer: Self-pay

## 2014-07-03 ENCOUNTER — Encounter: Payer: Self-pay | Admitting: Pulmonary Disease

## 2014-07-03 VITALS — BP 120/58 | HR 57 | Ht 62.0 in | Wt 193.0 lb

## 2014-07-03 DIAGNOSIS — I509 Heart failure, unspecified: Secondary | ICD-10-CM

## 2014-07-03 DIAGNOSIS — J9611 Chronic respiratory failure with hypoxia: Secondary | ICD-10-CM | POA: Insufficient documentation

## 2014-07-03 DIAGNOSIS — J209 Acute bronchitis, unspecified: Secondary | ICD-10-CM

## 2014-07-03 DIAGNOSIS — R0600 Dyspnea, unspecified: Secondary | ICD-10-CM | POA: Insufficient documentation

## 2014-07-03 DIAGNOSIS — J40 Bronchitis, not specified as acute or chronic: Secondary | ICD-10-CM

## 2014-07-03 NOTE — Assessment & Plan Note (Signed)
Today on ambulation her oxygen saturation dropped to 86% after just walking a few feet on room air. We will prescribe 2 L to use with exertion as well as each bedtime. We will check an overnight oximetry test on room air. See workup for dyspnea as detailed above.

## 2014-07-03 NOTE — Progress Notes (Signed)
Subjective:    Patient ID: [name], female    DOB: August 31, 1941, 73 y.o.   MRN: [id number]  Synopsis: First referred to the [facility] in 2015 for evaluation of dyspnea. Had pulmonary function test which showed a DLCO disproportionately low compared to a normal total lung capacity. No airflow obstruction. Never smoker. Has diastolic heart failure.  HPI  [name] has been struggling with dyspnea lately.  She was recently hospitalized for about 5-6 days.  Since then (and even before according to her granddaughter) she has had really low energy for several months.    She has also been complaining of dizziness and fell just three days ago because of a fall.  She has been having a lot of difficulty with feeling unstable.  She says that tshe just feels really tired.  She hasn't been out of bed.  She is dizzy primarily when she gets up from a seated or lying down position.  She is continuously taking lasix.  She is not currently monitoring her weight at home regularly with the tele-scale she used previously.  She she is currently weighing between 185-200lbs.  She left the hospital around this weight as well (about 185).    She felt chest heaviness and congestion but never produced mucus.  She feels chest heaviness when she gets up and moves.  During hospitalization recently she was diuresed aggressively.  Never smoker, but heavy second hand smoke.    Past Medical History  Diagnosis Date  . History of ventricular tachycardia   . Peripheral edema   . Aortic insufficiency     s/p tissue AVR 2013  . History of gout   . Exogenous obesity   . History of CHF (congestive heart failure)   . 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 Hypertension   . 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 DOE (dyspnea on exertion)   . Stroke 2000  . Renal insufficiency   . Anxiety   . Heart murmur     prior AVR with asacending aortic root  replacement August 2013  . Anemia   . Presence of IVC filter 2000  . Osteopenia 09/01/2013  . Coronary artery disease 2013    non obstructive  . Myocardial infarction 2000  . DVT (deep venous thrombosis)      Review of Systems  Constitutional: Positive for fatigue. Negative for fever and chills.  HENT: Negative for nosebleeds, postnasal drip and rhinorrhea.   Respiratory: Positive for shortness of breath. Negative for cough and wheezing.   Cardiovascular: Positive for leg swelling. Negative for chest pain and palpitations.       Objective:   Physical Exam Filed Vitals:   07/03/14 1518  BP: 120/58  Pulse: 57  Height: 5\' 2"  (1.575 m)  Weight: 193 lb (87.544 kg)  SpO2: 96%  RA  Ambulated 100 feet on room air and O2 saturation dropped to 86%  Gen: chronically ill appearing, no acute distress HEENT: NCAT, PERRL, EOMi, OP clear, neck supple without masses PULM: CTA B CV: RRR, no mgr, no JVD AB: BS+, soft, nontender, no hsm Ext: warm, Trace edema, no clubbing, no cyanosis Derm: no rash or skin breakdown Neuro: A&Ox4, CN II-XII intact, strength 5/5 in all 4 extremities  September 2015 chest x-ray> interstitial fibrosis versus edema noted     Assessment & Plan:   Bronchitis with bronchospasm She has recovered from this problem. I do not hear evidence of  wheezing on physical exam today. She does not have evidence of chronic obstructive pulmonary disease based on multiple normal pulmonary function test.  I explained to her today that she can stop taking the [facility] as it is not helping.  Acute exacerbation of CHF (congestive heart failure) She seems to have recovered from her recent flare of CHF. However, she is not currently monitoring her weight at home. I also questioned whether or not there could be some pulmonary hypertension considering the fact that her DLCO was disproportionately low compared to her total lung capacity on her pulmonary function testing from 2013. See  discussion below. She may ultimately need to have a right heart catheterization to see what her wedge pressure is in comparison to her pulmonary artery pressures. My suspicion is that her wedge pressures chronically elevated.  Dyspnea This problem has been worsening lately. I explained to her today that this problem is multifactorial at best. First and foremost her obesity and deconditioning are the strongest contributors. I also believe that she has Chronic CHF which is contributing. She does not have evidence of airways disease but we have not performed pulmonary function testing or CT scanning to evaluate for pulmonary parenchymal or pulmonary vascular disease and a few years.  Considering her demographic we should consider occult pulmonary fibrosis versus bronchomalacia versus pulmonary hypertension.  Plan: -CT chest high-resolution to look for pulmonary fibrosis -Full pulmonary function test -May need to have right heart catheterization -Refer to pulmonary rehabilitation for deconditioning, diagnosis CHF  Chronic hypoxemic respiratory failure Today on ambulation her oxygen saturation dropped to 86% after just walking a few feet on room air. We will prescribe 2 L to use with exertion as well as each bedtime. We will check an overnight oximetry test on room air. See workup for dyspnea as detailed above.    Updated Medication List Outpatient Encounter Prescriptions as of 07/03/2014  Medication Sig  . acetaminophen (TYLENOL) 500 MG tablet Take 500 mg by mouth every 6 (six) hours as needed for moderate pain.  [name] allopurinol (ZYLOPRIM) 100 MG tablet Take 1 tablet (100 mg total) by mouth 2 (two) times daily.  [name] amLODipine (NORVASC) 5 MG tablet Take 1 tablet (5 mg total) by mouth daily.  [name] aspirin EC 81 MG tablet Take 1 tablet (81 mg total) by mouth daily.  . Biotin 5000 MCG CAPS Take 1 capsule (5,000 mcg total) by mouth daily.  . bisoprolol (ZEBETA) 10 MG tablet Take 1 tablet (10 mg total) by  mouth 2 (two) times daily.  . Calcium Carb-Cholecalciferol (CALCIUM-VITAMIN D) 600-400 MG-UNIT TABS Take 1 tablet by mouth daily.  . furosemide (LASIX) 40 MG tablet Take 40 mg by mouth 2 (two) times daily.  [name] lisinopril (PRINIVIL,ZESTRIL) 10 MG tablet Take 1 tablet (10 mg total) by mouth daily.  [name] OVER THE COUNTER MEDICATION Take 1 tablet by mouth daily as needed (for cough and cold). "[facility] Cough & Cold HBP"  . potassium chloride SA (K-DUR,KLOR-CON) 20 MEQ tablet Take 1 tablet (20 mEq total) by mouth 3 (three) times daily.  . rosuvastatin (CRESTOR) 5 MG tablet Take 1 tablet (5 mg total) by mouth daily.  [name] senna-docusate (SENOKOT S) 8.6-50 MG per tablet Take 2 tablets by mouth at bedtime. For constipation, also available OTC  . warfarin (COUMADIN) 5 MG tablet Take 2.5-5 mg by mouth daily. Take 5 mg by mouth on Wednesday, then take 2.5 mg by mouth on all other days.  . mometasone-formoterol (DULERA) 100-5 MCG/ACT AERO Inhale  2 puffs into the lungs 2 (two) times daily.  . [DISCONTINUED] azithromycin (ZITHROMAX) 250 MG tablet Have finished 4/5 days of azithromycin in hospital, will need 1 more dose on 9/26  . [DISCONTINUED] predniSONE (STERAPRED UNI-PAK) 10 MG tablet Take by mouth daily. Started at 60mg  (6 tablet) on 9/26, then 50mg  (5 tabs) the next day, then 40, 30, 20, 10mg , then continue on 10mg

## 2014-07-03 NOTE — Assessment & Plan Note (Signed)
She seems to have recovered from her recent flare of CHF. However, she is not currently monitoring her weight at home. I also questioned whether or not there could be some pulmonary hypertension considering the fact that her DLCO was disproportionately low compared to her total lung capacity on her pulmonary function testing from 2013. See discussion below. She may ultimately need to have a right heart catheterization to see what her wedge pressure is in comparison to her pulmonary artery pressures. My suspicion is that her wedge pressures chronically elevated.

## 2014-07-03 NOTE — Patient Instructions (Signed)
We will arrange lung function testing and a CT scan of your chest We will set you up with pulmonary rehab You may need to have a right heart catheterization, we will call you to let you know We will see you back in 3-4 weeks or sooner if needed

## 2014-07-03 NOTE — Assessment & Plan Note (Signed)
This problem has been worsening lately. I explained to her today that this problem is multifactorial at best. First and foremost her obesity and deconditioning are the strongest contributors. I also believe that she has Chronic CHF which is contributing. She does not have evidence of airways disease but we have not performed pulmonary function testing or CT scanning to evaluate for pulmonary parenchymal or pulmonary vascular disease and a few years.  Considering her demographic we should consider occult pulmonary fibrosis versus bronchomalacia versus pulmonary hypertension.  Plan: -CT chest high-resolution to look for pulmonary fibrosis -Full pulmonary function test -May need to have right heart catheterization -Refer to pulmonary rehabilitation for deconditioning, diagnosis CHF

## 2014-07-03 NOTE — Assessment & Plan Note (Signed)
She has recovered from this problem. I do not hear evidence of wheezing on physical exam today. She does not have evidence of chronic obstructive pulmonary disease based on multiple normal pulmonary function test.  I explained to her today that she can stop taking the [facility] as it is not helping.

## 2014-07-06 ENCOUNTER — Ambulatory Visit (INDEPENDENT_AMBULATORY_CARE_PROVIDER_SITE_OTHER): Payer: Medicare Other

## 2014-07-06 ENCOUNTER — Ambulatory Visit (INDEPENDENT_AMBULATORY_CARE_PROVIDER_SITE_OTHER): Payer: Medicare Other | Admitting: *Deleted

## 2014-07-06 DIAGNOSIS — I428 Other cardiomyopathies: Secondary | ICD-10-CM

## 2014-07-06 DIAGNOSIS — I482 Chronic atrial fibrillation, unspecified: Secondary | ICD-10-CM

## 2014-07-06 DIAGNOSIS — Z954 Presence of other heart-valve replacement: Secondary | ICD-10-CM

## 2014-07-06 DIAGNOSIS — I4891 Unspecified atrial fibrillation: Secondary | ICD-10-CM

## 2014-07-06 DIAGNOSIS — I352 Nonrheumatic aortic (valve) stenosis with insufficiency: Secondary | ICD-10-CM

## 2014-07-06 DIAGNOSIS — I509 Heart failure, unspecified: Secondary | ICD-10-CM

## 2014-07-06 DIAGNOSIS — I469 Cardiac arrest, cause unspecified: Secondary | ICD-10-CM

## 2014-07-06 DIAGNOSIS — Z952 Presence of prosthetic heart valve: Secondary | ICD-10-CM

## 2014-07-06 DIAGNOSIS — I429 Cardiomyopathy, unspecified: Secondary | ICD-10-CM

## 2014-07-06 DIAGNOSIS — Z5181 Encounter for therapeutic drug level monitoring: Secondary | ICD-10-CM

## 2014-07-06 DIAGNOSIS — I447 Left bundle-branch block, unspecified: Secondary | ICD-10-CM

## 2014-07-06 LAB — MDC_IDC_ENUM_SESS_TYPE_INCLINIC
Battery Voltage: 3 V
Date Time Interrogation Session: 20151008105447
HIGH POWER IMPEDANCE MEASURED VALUE: 51 Ohm
HighPow Impedance: 64 Ohm
Lead Channel Impedance Value: 944 Ohm
Lead Channel Pacing Threshold Amplitude: 1 V
Lead Channel Pacing Threshold Pulse Width: 0.8 ms
Lead Channel Setting Pacing Pulse Width: 0.8 ms
Lead Channel Setting Sensing Sensitivity: 0.3 mV
MDC IDC MSMT LEADCHNL RV SENSING INTR AMPL: 10.6496
MDC IDC SET LEADCHNL RV PACING AMPLITUDE: 2.5 V
MDC IDC STAT BRADY RV PERCENT PACED: 17.62 %
Zone Setting Detection Interval: 290 ms
Zone Setting Detection Interval: 330 ms
Zone Setting Detection Interval: 450 ms

## 2014-07-06 LAB — POCT INR: INR: 3.2

## 2014-07-06 NOTE — Patient Instructions (Signed)
ICD check in clinic. Normal device function. Threshold and sensing consistent with previous device measurements. Impedance trends stable over time. No evidence of any ventricular arrhythmias. No mode switches. Thoracic impedance WNL at present. Histogram distribution appropriate for patient and level of activity. No changes made this session. Device programmed at appropriate safety margins. Device programmed to optimize intrinsic conduction. Batt voltage 3.00V (ERI 2.62V).  Plan to follow up with the Device [facility] in 3 months and with [facility] in 11-2014. Patient education completed including shock plan. Pt unable to hear audible alert.

## 2014-07-10 ENCOUNTER — Telehealth (HOSPITAL_COMMUNITY): Payer: Self-pay | Admitting: *Deleted

## 2014-07-11 ENCOUNTER — Encounter: Payer: Medicare Other | Admitting: Nurse Practitioner

## 2014-07-19 ENCOUNTER — Other Ambulatory Visit: Payer: Self-pay | Admitting: Internal Medicine

## 2014-07-19 ENCOUNTER — Other Ambulatory Visit: Payer: Self-pay | Admitting: Cardiology

## 2014-07-21 ENCOUNTER — Encounter: Payer: Self-pay | Admitting: Pulmonary Disease

## 2014-07-25 ENCOUNTER — Ambulatory Visit (INDEPENDENT_AMBULATORY_CARE_PROVIDER_SITE_OTHER): Payer: Medicare Other | Admitting: Pharmacist Clinician (PhC)/ Clinical Pharmacy Specialist

## 2014-07-25 ENCOUNTER — Ambulatory Visit (INDEPENDENT_AMBULATORY_CARE_PROVIDER_SITE_OTHER)
Admission: RE | Admit: 2014-07-25 | Discharge: 2014-07-25 | Disposition: A | Discharge status: Home / Self Care | Payer: Medicare Other | Source: Ambulatory Visit | Attending: Pulmonary Disease | Admitting: Pulmonary Disease

## 2014-07-25 ENCOUNTER — Encounter: Payer: Self-pay | Admitting: Nurse Practitioner

## 2014-07-25 ENCOUNTER — Ambulatory Visit (INDEPENDENT_AMBULATORY_CARE_PROVIDER_SITE_OTHER): Payer: Medicare Other | Admitting: Nurse Practitioner

## 2014-07-25 VITALS — BP 130/70 | HR 58 | Ht 62.0 in | Wt 195.8 lb

## 2014-07-25 DIAGNOSIS — I352 Nonrheumatic aortic (valve) stenosis with insufficiency: Secondary | ICD-10-CM

## 2014-07-25 DIAGNOSIS — I4891 Unspecified atrial fibrillation: Secondary | ICD-10-CM

## 2014-07-25 DIAGNOSIS — Z954 Presence of other heart-valve replacement: Secondary | ICD-10-CM

## 2014-07-25 DIAGNOSIS — I429 Cardiomyopathy, unspecified: Secondary | ICD-10-CM

## 2014-07-25 DIAGNOSIS — I428 Other cardiomyopathies: Secondary | ICD-10-CM

## 2014-07-25 DIAGNOSIS — N183 Chronic kidney disease, stage 3 unspecified: Secondary | ICD-10-CM

## 2014-07-25 DIAGNOSIS — I5032 Chronic diastolic (congestive) heart failure: Secondary | ICD-10-CM

## 2014-07-25 DIAGNOSIS — Z952 Presence of prosthetic heart valve: Secondary | ICD-10-CM

## 2014-07-25 DIAGNOSIS — Z5181 Encounter for therapeutic drug level monitoring: Secondary | ICD-10-CM

## 2014-07-25 DIAGNOSIS — R0600 Dyspnea, unspecified: Secondary | ICD-10-CM

## 2014-07-25 DIAGNOSIS — I10 Essential (primary) hypertension: Secondary | ICD-10-CM

## 2014-07-25 LAB — BASIC METABOLIC PANEL
BUN: 31 mg/dL — ABNORMAL HIGH (ref 6–23)
CO2: 25 mEq/L (ref 19–32)
Calcium: 9.6 mg/dL (ref 8.4–10.5)
Chloride: 105 mEq/L (ref 96–112)
Creatinine, Ser: 1.7 mg/dL — ABNORMAL HIGH (ref 0.4–1.2)
GFR: 31.15 mL/min — ABNORMAL LOW (ref >60.00)
Glucose, Bld: 84 mg/dL (ref 70–99)
Potassium: 4.5 mEq/L (ref 3.5–5.1)
Sodium: 141 mEq/L (ref 135–145)

## 2014-07-25 LAB — POCT INR: INR: 2.2

## 2014-07-25 LAB — BRAIN NATRIURETIC PEPTIDE: Pro B Natriuretic peptide (BNP): 284 pg/mL — ABNORMAL HIGH (ref 0.0–100.0)

## 2014-07-25 NOTE — Patient Instructions (Addendum)
We will be checking the following labs today BMET and BNP  Stay on your current medicines  See Dr. Kendrick Fries back as planned later this week.   See Dr. Patty Sermons as planned in December  Call the [facility] office at [phone] if you have any questions, problems or concerns.

## 2014-07-25 NOTE — Progress Notes (Signed)
[name] Date of Birth: 1941/07/02 Medical Record #[id number]  History of Present Illness: [name] is seen back today for a post hospital visit. Seen for Dr. Patty Sermons. [name] is a 73 year old female with NICM - past EF down to 20%, has ICD in place (followed by Dr. Ladona Ridgel), tissue AVR and aortic root replacement back in 2013 with LAA clipping. [name] has chronic AF and is on coumadin, LBBB, HTN, prior CVA, chronic dyspnea, stage 3 CKD and diastolic heart failure.  Most recently admitted with acute exacerbation of [name] diastolic HF with superimposed bronchitis with bronchospasm. Was diuresed. Placed on steroids and was to see pulmonary as well.   Has seen Dr. Kendrick Fries - his recommendations "This problem has been worsening lately. I explained to [name] today that this problem is multifactorial at best. First and foremost [name] obesity and deconditioning are Heather strongest contributors. I also believe that [name] has Chronic CHF which is contributing. [name] does not have evidence of airways disease but we have not performed pulmonary function testing or CT scanning to evaluate for pulmonary parenchymal or pulmonary vascular disease and a few years. Considering [name] demographic we should consider occult pulmonary fibrosis versus bronchomalacia versus pulmonary hypertension.  Plan: -CT chest high-resolution to look for pulmonary fibrosis -Full pulmonary function test -May need to have right heart catheterization -Refer to pulmonary rehabilitation for deconditioning, diagnosis CHF" - He will see [name] back in about 3 to 4 weeks.   Comes back today. Here with [name] grand-daughter. [name] has had [name] coumadin checked already this morning. Has had [name] CT as well for Dr. Kendrick Fries. Sees him later this week. [name] is doing ok. Remains short of breath. [name] is now on oxygen and seems to be doing better. Pretty sedentary. Weight going up - drinking lots of Chick [employer] lemonade - about a gallon a day apparently. Family a little hesitant  about invasive procedures. Tolerating [name] medicines.   Current Outpatient Prescriptions  Medication Sig Dispense Refill  . acetaminophen (TYLENOL) 500 MG tablet Take 500 mg by mouth every 6 (six) hours as needed for moderate pain.      [name] allopurinol (ZYLOPRIM) 100 MG tablet TAKE 1 TABLET (100 MG TOTAL) BY MOUTH 2 (TWO) TIMES DAILY.  60 tablet  5  . amLODipine (NORVASC) 5 MG tablet Take 1 tablet (5 mg total) by mouth daily.  30 tablet  6  . aspirin EC 81 MG tablet Take 1 tablet (81 mg total) by mouth daily.  90 tablet  3  . Biotin 5000 MCG CAPS Take 1 capsule (5,000 mcg total) by mouth daily.  30 capsule  6  . bisoprolol (ZEBETA) 10 MG tablet TAKE 1 TABLET (10 MG TOTAL) BY MOUTH 2 (TWO) TIMES DAILY.  15 tablet  0  . Calcium Carb-Cholecalciferol (CALCIUM-VITAMIN D) 600-400 MG-UNIT TABS Take 1 tablet by mouth daily.      . CRESTOR 5 MG tablet TAKE 1 TABLET (5 MG TOTAL) BY MOUTH DAILY.  30 tablet  5  . furosemide (LASIX) 40 MG tablet Take 40 mg by mouth 2 (two) times daily.      [name] lisinopril (PRINIVIL,ZESTRIL) 10 MG tablet Take 1 tablet (10 mg total) by mouth daily.  30 tablet  2  . potassium chloride SA (K-DUR,KLOR-CON) 20 MEQ tablet Take 1 tablet (20 mEq total) by mouth 3 (three) times daily.  90 tablet  11  . senna-docusate (SENOKOT S) 8.6-50 MG per tablet Take 2 tablets by mouth at bedtime. For  constipation, also available OTC  60 tablet  3  . warfarin (COUMADIN) 5 MG tablet Take 2.5-5 mg by mouth daily. Take 5 mg by mouth on Wednesday, then take 2.5 mg by mouth on all other days.       No current facility-administered medications for this visit.    Allergies  Allergen Reactions  . Adhesive [Tape] Itching, Rash and Other (See Comments)    Redness and skin breaks down  . Latex Rash  . Neomycin-Bacitracin Zn-Polymyx Rash    Past Medical History  Diagnosis Date  . History of ventricular tachycardia   . Peripheral edema   . Aortic insufficiency     s/p tissue AVR 2013  . History of  gout   . Exogenous obesity   . History of CHF (congestive heart failure)   . 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 Hypertension   . 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 DOE (dyspnea on exertion)   . Stroke 2000  . Renal insufficiency   . Anxiety   . 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 Anemia   . Presence of IVC filter 2000  . Osteopenia 09/01/2013  . Coronary artery disease 2013    non obstructive  . Myocardial infarction 2000  . DVT (deep venous thrombosis)     Past Surgical History  Procedure Laterality Date  . Cardiac defibrillator placement  2013    MDT ICD  . Cataract extraction    . Embolectomy  02/15/2012    Procedure: EMBOLECTOMY;  Surgeon: [name], MD;  Location: [facility] OR;  Service: Vascular;  Laterality: Left;  left popliteal embolectomy with drain placement  . Eye surgery  2012    removal of cataracts bilaterally  . Cardiac catheterization  7/13    non obstructive CAD  . Tonsillectomy    . Aortic valve replacement  04/12/2012    Procedure: AORTIC VALVE REPLACEMENT (AVR);  Surgeon: Delight Ovens, MD;  Location: [facility] OR;  Service: Open Heart Surgery;  Laterality: N/A;  with nitric oxide  . Thoracic aortic aneurysm repair  04/12/2012    Procedure: THORACIC ASCENDING ANEURYSM REPAIR (AAA);  Surgeon: Delight Ovens, MD;  Location: [facility] OR;  Service: Open Heart Surgery;  Laterality: N/A;  . Clot removal  5.2013    blood clot removed from Heather back of Heather left leg  . Stomach surgery      lap band placed and later removed    History  Smoking status  . Never Smoker   Smokeless tobacco  . Never Used    History  Alcohol Use No    Family History  Problem Relation Age of Onset  . Hypertension Father   . Cancer Father   . Coronary artery disease Father 19  . Cancer Son     pancreas, spine    Review of Systems: Heather review of systems is  per Heather HPI.  All other systems were reviewed and are negative.  Physical Exam: BP 130/70  Pulse 58  Ht 5\' 2"  (1.575 m)  Wt 195 lb 12.8 oz (88.814 kg)  BMI 35.80 kg/m2  SpO2 97% [name] is very pleasant and in no acute distress. [name] is obese. Skin is warm and dry. Color is normal.  HEENT is unremarkable. Normocephalic/atraumatic. PERRL. Sclera are nonicteric. Neck is supple. No masses. No JVD. Lungs are clear. Cardiac exam shows an irregular  rhythm. Rate ok. Harsh outflow murmur noted. Abdomen is soft. Extremities are fleshy with 1+ edema. Gait and ROM are intact. No gross neurologic deficits noted.  Wt Readings from Last 3 Encounters:  07/25/14 195 lb 12.8 oz (88.814 kg)  07/03/14 193 lb (87.544 kg)  06/23/14 192 lb 4.8 oz (87.227 kg)    LABORATORY DATA/PROCEDURES: PENDING  Lab Results  Component Value Date   WBC 11.1* 06/20/2014   HGB 12.5 06/20/2014   HCT 38.0 06/20/2014   PLT 147* 06/20/2014   GLUCOSE 137* 06/23/2014   CHOL 180 05/08/2014   TRIG 107.0 05/08/2014   HDL 65.30 05/08/2014   LDLDIRECT 152.6 09/01/2011   LDLCALC 93 05/08/2014   ALT 14 06/19/2014   AST 23 06/19/2014   NA 144 06/23/2014   K 5.1 06/23/2014   CL 100 06/23/2014   CREATININE 1.69* 06/23/2014   BUN 43* 06/23/2014   CO2 25 06/23/2014   TSH 1.400 06/20/2014   INR 2.2 07/25/2014   HGBA1C 5.6 11/09/2013    BNP (last 3 results)  Recent Labs  11/04/13 1824 11/05/13 0045 06/19/14 1930  PROBNP 3503.0* 3807.0* 4805.0*   Echo Study Conclusions from September 2015  - Left ventricle: Heather cavity size was normal. Wall thickness was increased in a pattern of moderate LVH. Systolic function was normal. Heather estimated ejection fraction was in Heather range of 50% to 55%. Wall motion was normal; there were no regional wall motion abnormalities. Doppler parameters are consistent with high ventricular filling pressure. - Aortic valve: A bioprosthesis was present. Peak velocity (S): 278 cm/s. - Aorta: Aortic root dimension:  43 mm at Heather sinus of Valsalva, 73mm ascending root (ED). - Mitral valve: Calcified annulus. - Left atrium: Heather atrium was moderately dilated. - Right ventricle: Heather cavity size was mildly dilated. Wall thickness was normal. - Pulmonary arteries: Systolic pressure was mildly increased. PA peak pressure: 40 mm Hg (S).  Impressions:  - Mildly improved EF when compared to prior.    Cardiac Catheterization Procedure Note  Name: KILEY FOWLER  MRN: [id number]  DOB: Mar 10, 1941  Procedure: Right Heart Cath, Left Heart Cath, Selective Coronary Angiography, aortic root aortogram  Indication: Severe aortic insufficiency with heart failure, pre-cardiac surgery evaluation.  Procedural Details: Heather right groin was prepped, draped, and anesthetized with 1% lidocaine. Using Heather modified Seldinger technique a 5 French sheath was placed in Heather right femoral artery and a 6 French sheath was placed in Heather right femoral vein. A 6 French multipurpose catheter was used for Heather right heart catheterization. [name] has an IVC filter and Heather catheter was guided under fluoroscopy so that there was no trauma to Heather filter. Standard protocol was followed for recording of right heart pressures and sampling of oxygen saturations. Fick cardiac output was calculated. Standard Judkins catheters were used for selective coronary angiography and aortic root angiography. For Heather left coronary artery, a JL 5 catheter was used. There were no immediate procedural complications. [name] was transferred to Heather post catheterization recovery area for further monitoring.  Procedural Findings:  Hemodynamics  RA 17  RV 68/14  PA 69/31 with a mean of 48  PCWP 28  LV 165/29  AO 171/69 with a mean of 108  Oxygen saturations:  PA 60%  AO 98%  Cardiac Output (Fick) 4.3 L per  Cardiac Index (Fick) 2.4 L per minute per meters  Coronary angiography:  Coronary dominance: right  Left mainstem: Heather left mainstem is patent. There  is no obstructive  disease.  Left anterior descending (LAD): Heather LAD is patent to Heather left ventricular apex. Heather vessel wraps around Heather apex. There are minor irregularities but no significant stenoses throughout. Heather first diagonal is a large caliber vessel without significant disease  Left circumflex (LCx): Heather left circumflex is patent. There is a large first OM with no significant disease. Heather proximal circumflex has 30% stenosis.  Right coronary artery (RCA): Heather RCA is dominant. This is a large vessels. It is smooth throughout its course. There is no obstructive disease present. He gives off a PDA and 2 posterolateral branches without significant disease.  Aortic root angiography: There is severe aortic insufficiency. Heather left ventricle is opacified and appears to have severe diffuse hypokinesis.  Final Conclusions:  1. Mild nonobstructive diffuse coronary artery disease.  2. Severe aortic insufficiency with known severe LV dysfunction  3. Elevated left heart and right heart pressures with severe pulmonary hypertension and elevated left ventricular filling pressure.  [name] is pending cardiac surgery early next week. considering [name] atrial fibrillation and history of arterial embolus within Heather past 6 months. [name] will be started on intravenous unfractionated heparin  Tonny BollmanMichael Cooper  04/08/2012, 11:06 AM     Assessment / Plan: 1. Chronic diastolic HF -  Check lab today. No change in current regimen. [name] is getting too much salt - advised to cut back. Weight is up a few pounds but drinking lots of sugar. Check BNP  2. Chronic AF - rate controlled.   3. Chronic coumadin therapy  4. CKD  5. Prior AVR and aortic root replacement  6. Chronic dyspnea - has had prior right heart cath as prelude to [name] surgery back in 2013 - severe pulmonary HTN noted at that time - follow up planned with pulmonary. CT pending results. Now on oxygen - [name] says [name] is improved.  See back as planned in  December.   [name] is agreeable to this plan and will call if any problems develop in Heather interim.   Rosalio MacadamiaLori C. Airyn Ellzey, RN, ANP-C [facility] [address] [place], [place]  [address] [phone]

## 2014-07-27 ENCOUNTER — Ambulatory Visit (INDEPENDENT_AMBULATORY_CARE_PROVIDER_SITE_OTHER): Payer: Medicare Other | Admitting: Pulmonary Disease

## 2014-07-27 ENCOUNTER — Encounter: Payer: Self-pay | Admitting: Pulmonary Disease

## 2014-07-27 ENCOUNTER — Telehealth (HOSPITAL_COMMUNITY): Payer: Self-pay

## 2014-07-27 ENCOUNTER — Ambulatory Visit (HOSPITAL_COMMUNITY)
Admission: RE | Admit: 2014-07-27 | Discharge: 2014-07-27 | Disposition: A | Discharge status: Home / Self Care | Payer: Medicare Other | Source: Ambulatory Visit | Attending: Pulmonary Disease | Admitting: Pulmonary Disease

## 2014-07-27 VITALS — BP 134/68 | HR 74

## 2014-07-27 DIAGNOSIS — J9611 Chronic respiratory failure with hypoxia: Secondary | ICD-10-CM

## 2014-07-27 DIAGNOSIS — R0600 Dyspnea, unspecified: Secondary | ICD-10-CM

## 2014-07-27 DIAGNOSIS — I429 Cardiomyopathy, unspecified: Secondary | ICD-10-CM

## 2014-07-27 DIAGNOSIS — I509 Heart failure, unspecified: Secondary | ICD-10-CM

## 2014-07-27 DIAGNOSIS — I428 Other cardiomyopathies: Secondary | ICD-10-CM

## 2014-07-27 LAB — PULMONARY FUNCTION TEST
DL/VA % pred: 63 %
DL/VA: 2.9 ml/min/mmHg/L
DLCO unc % pred: 32 %
DLCO unc: 7.08 ml/min/mmHg
FEF 25-75 PRE: 1.17 L/s
FEF 25-75 Post: 1.12 L/sec
FEF2575-%Change-Post: -3 %
FEF2575-%PRED-POST: 67 %
FEF2575-%Pred-Pre: 69 %
FEV1-%CHANGE-POST: -1 %
FEV1-%PRED-POST: 55 %
FEV1-%Pred-Pre: 56 %
FEV1-Post: 1.12 L
FEV1-Pre: 1.13 L
FEV1FVC-%Change-Post: -6 %
FEV1FVC-%Pred-Pre: 110 %
FEV6-%Change-Post: -1 %
FEV6-%PRED-POST: 53 %
FEV6-%Pred-Pre: 53 %
FEV6-POST: 1.35 L
FEV6-PRE: 1.37 L
FEV6FVC-%Change-Post: -6 %
FEV6FVC-%PRED-PRE: 105 %
FEV6FVC-%Pred-Post: 98 %
FVC-%Change-Post: 6 %
FVC-%PRED-PRE: 51 %
FVC-%Pred-Post: 54 %
FVC-PRE: 1.37 L
FVC-Post: 1.45 L
POST FEV6/FVC RATIO: 93 %
PRE FEV6/FVC RATIO: 100 %
Post FEV1/FVC ratio: 77 %
Pre FEV1/FVC ratio: 83 %
RV % PRED: 63 %
RV: 1.36 L
TLC % PRED: 64 %
TLC: 3.04 L

## 2014-07-27 MED ORDER — ALBUTEROL SULFATE (2.5 MG/3ML) 0.083% IN NEBU
2.5000 mg | INHALATION_SOLUTION | Freq: Once | RESPIRATORY_TRACT | Status: AC
  Administered 2014-07-27: 2.5 mg via RESPIRATORY_TRACT

## 2014-07-27 NOTE — Progress Notes (Signed)
Subjective:    Patient ID: Heather AmmonsJeritta R [name], female    DOB: 12/02/1940, 73 y.o.   MRN: [id number]  Synopsis: First referred to the [place] [facility] in 2015 for evaluation of dyspnea. Had pulmonary function test which showed a DLCO disproportionately low compared to a normal total lung capacity. No airflow obstruction. Never smoker. Has diastolic heart failure.  2013 pulmonary function test> ratio 84%, FEV1 1.05 L (53% predicted, 0% change with bronchodilator), total lung capacity 3.82 L (82% predicted), DLCO 11.77 (58% predicted) October 2015 pulmonary function test> ratio 77%, FEV1 1.12 L (55% predicted), Total lung capacity 3.04 L (64% predicted), DLCO 7.08 (32% protected) 07/25/2014 high-resolution CT> no evidence of interstitial lung disease, there is mosaicism suggestive of air trapping or pulmonary hypertension, three-vessel coronary disease, status post bioprosthetic aortic valve replacement with aortic diameter of 4.5 cm  HPI  07/27/2014 ROV> She has been trying to wear her oxygen but she doesn't like wearing the oxygen much.  She once recently walked about 30 feet off oxygen and she dropped to 61% on room air.  She doesn't like using it.  Her dyspnea hasn't improved since the last visit even with the oxygen.  Her weight has been stable since the last visit. She has not been exercising. She refuses to go to pulmonary rehabilitation. She has not been coughing more and she had sputum production. She denies chest pain. Her leg swelling has been the same since the last visit. She is here to discuss the results of her pulmonary function testing and CT scan.  Past Medical History  Diagnosis Date  . History of ventricular tachycardia   . Peripheral edema   . Aortic insufficiency     s/p tissue AVR 2013  . History of gout   . Exogenous obesity   . History of CHF (congestive heart failure)   . 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 Hypertension   .  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 DOE (dyspnea on exertion)   . Stroke 2000  . Renal insufficiency   . Anxiety   . 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 Anemia   . Presence of IVC filter 2000  . Osteopenia 09/01/2013  . Coronary artery disease 2013    non obstructive  . Myocardial infarction 2000  . DVT (deep venous thrombosis)      Review of Systems  Constitutional: Positive for fatigue. Negative for fever and chills.  HENT: Negative for nosebleeds, postnasal drip and rhinorrhea.   Respiratory: Positive for shortness of breath. Negative for cough and wheezing.   Cardiovascular: Positive for leg swelling. Negative for chest pain and palpitations.       Objective:   Physical Exam  Filed Vitals:   07/27/14 1036  BP: 134/68  Pulse: 74  SpO2: 95%  RA  Ambulated 100 feet on room air and O2 saturation dropped to 86%  Gen: chronically ill appearing, no acute distress HEENT: NCAT, EOMi, OP clear, PULM: CTA B CV: RRR, systolic murmur, no JVD AB: BS+, soft, nontender, no hsm Ext: warm, Trace edema, no clubbing, no cyanosis Derm: no rash or skin breakdown Neuro: A&Ox4, MAEW      Assessment & Plan:   Dyspnea At this point I do not see convincing evidence for parenchymal or airways disease. However, she does have restrictive lung disease which is very likely related to obesity considering the  fact that she has no evidence of pulmonary parenchymal disease on CT scan. There has been a moderate reduction in her total lung capacity and DLCO since 2013.  Objectively, the only abnormality on the CT scan was mosaicism which can be seen with either pulmonary hypertension or air-trapping. There is no evidence of airways disease on her lung function testing so I favor pulmonary hypertension.  I explained to her today that I believe pulmonary hypertension is the etiology of her shortness of breath in addition to  profound deconditioning, CHF, and obesity. 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 I suppose it is possible that she could have developed pulmonary arteriopathy since 2013.  Plan: -Proceed with right heart catheterization to evaluate for either further diuresis or the addition of a pulmonary vasodilator   Chronic hypoxemic respiratory failure There is no evidence of nocturnal hypoxemia. She does have exertional hypoxemia and so I have encouraged her to continue using oxygen on exertion. She does not need it at rest.  NICM- EF 20% 2013- 45-50% Feb 2015 Continue management per the advanced heart failure team.    Updated Medication List Outpatient Encounter Prescriptions as of 07/27/2014  Medication Sig  . acetaminophen (TYLENOL) 500 MG tablet Take 500 mg by mouth every 6 (six) hours as needed for moderate pain.  [name] allopurinol (ZYLOPRIM) 100 MG tablet TAKE 1 TABLET (100 MG TOTAL) BY MOUTH 2 (TWO) TIMES DAILY.  [name] amLODipine (NORVASC) 5 MG tablet Take 1 tablet (5 mg total) by mouth daily.  [name] aspirin EC 81 MG tablet Take 1 tablet (81 mg total) by mouth daily.  . Biotin 5000 MCG CAPS Take 1 capsule (5,000 mcg total) by mouth daily.  . bisoprolol (ZEBETA) 10 MG tablet TAKE 1 TABLET (10 MG TOTAL) BY MOUTH 2 (TWO) TIMES DAILY.  . Calcium Carb-Cholecalciferol (CALCIUM-VITAMIN D) 600-400 MG-UNIT TABS Take 1 tablet by mouth daily.  . CRESTOR 5 MG tablet TAKE 1 TABLET (5 MG TOTAL) BY MOUTH DAILY.  . furosemide (LASIX) 40 MG tablet Take 40 mg by mouth 2 (two) times daily.  [name] lisinopril (PRINIVIL,ZESTRIL) 10 MG tablet Take 1 tablet (10 mg total) by mouth daily.  . potassium chloride SA (K-DUR,KLOR-CON) 20 MEQ tablet Take 1 tablet (20 mEq total) by mouth 3 (three) times daily.  [name] senna-docusate (SENOKOT S) 8.6-50 MG per tablet Take 2 tablets by mouth at bedtime.  For constipation, also available OTC  . warfarin (COUMADIN) 5 MG tablet Take 2.5-5 mg by mouth daily. Take 5 mg by mouth on Wednesday, then take 2.5 mg by mouth on all other days.

## 2014-07-27 NOTE — Assessment & Plan Note (Addendum)
At this point I do not see convincing evidence for parenchymal or airways disease. However, she does have restrictive lung disease which is very likely related to obesity considering the fact that she has no evidence of pulmonary parenchymal disease on CT scan. There has been a moderate reduction in her total lung capacity and DLCO since 2013.  Objectively, the only abnormality on the CT scan was mosaicism which can be seen with either pulmonary hypertension or air-trapping. There is no evidence of airways disease on her lung function testing so I favor pulmonary hypertension.  I explained to her today that I believe pulmonary hypertension is the etiology of her shortness of breath in addition to profound deconditioning, CHF, and obesity. 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 I suppose it is possible that she could have developed pulmonary arteriopathy since 2013.  Plan: -Proceed with right heart catheterization to evaluate for either further diuresis or the addition of a pulmonary vasodilator

## 2014-07-27 NOTE — Assessment & Plan Note (Signed)
Continue management per the advanced heart failure team.

## 2014-07-27 NOTE — Patient Instructions (Signed)
We will have Dr. Prescott Gum office call you about a right heart catheterization We will refer you to pulmonary rehab Use your oxygen when you walk around We will see you back in 4-6 weeks or sooner if needed

## 2014-07-27 NOTE — Telephone Encounter (Signed)
I have called and left a message with Mirenda to inquire about participation in Pulmonary Rehab. Will send letter in mail and follow up. 

## 2014-07-27 NOTE — Assessment & Plan Note (Signed)
There is no evidence of nocturnal hypoxemia. She does have exertional hypoxemia and so I have encouraged her to continue using oxygen on exertion. She does not need it at rest.

## 2014-08-01 ENCOUNTER — Telehealth (HOSPITAL_COMMUNITY): Payer: Self-pay

## 2014-08-01 NOTE — Telephone Encounter (Signed)
I have called and left a message with Carolena to inquire about participation in Pulmonary Rehab. Will send letter in mail and follow up.

## 2014-08-03 ENCOUNTER — Encounter: Payer: Self-pay | Admitting: Internal Medicine

## 2014-08-21 ENCOUNTER — Other Ambulatory Visit: Payer: Self-pay | Admitting: Cardiology

## 2014-08-22 ENCOUNTER — Other Ambulatory Visit: Payer: Self-pay | Admitting: Cardiology

## 2014-08-23 ENCOUNTER — Ambulatory Visit (INDEPENDENT_AMBULATORY_CARE_PROVIDER_SITE_OTHER): Payer: Medicare Other | Admitting: *Deleted

## 2014-08-23 DIAGNOSIS — Z5181 Encounter for therapeutic drug level monitoring: Secondary | ICD-10-CM

## 2014-08-23 DIAGNOSIS — Z954 Presence of other heart-valve replacement: Secondary | ICD-10-CM

## 2014-08-23 DIAGNOSIS — I352 Nonrheumatic aortic (valve) stenosis with insufficiency: Secondary | ICD-10-CM

## 2014-08-23 DIAGNOSIS — I4891 Unspecified atrial fibrillation: Secondary | ICD-10-CM

## 2014-08-23 DIAGNOSIS — Z952 Presence of prosthetic heart valve: Secondary | ICD-10-CM

## 2014-08-23 LAB — POCT INR: INR: 2.8

## 2014-08-29 ENCOUNTER — Ambulatory Visit (INDEPENDENT_AMBULATORY_CARE_PROVIDER_SITE_OTHER): Payer: Medicare Other | Admitting: Pulmonary Disease

## 2014-08-29 ENCOUNTER — Encounter: Payer: Self-pay | Admitting: Pulmonary Disease

## 2014-08-29 VITALS — BP 126/76 | HR 68 | Ht 62.0 in | Wt 201.0 lb

## 2014-08-29 DIAGNOSIS — J9611 Chronic respiratory failure with hypoxia: Secondary | ICD-10-CM

## 2014-08-29 DIAGNOSIS — R0600 Dyspnea, unspecified: Secondary | ICD-10-CM

## 2014-08-29 NOTE — Assessment & Plan Note (Signed)
As noted previously this I think a right heart catheterization would be helpful here.  Her dyspnea has worsened this year despite her claim that her leg swelling and weight have not changed.  There is no evidence of airway disease on PFT.  Her CT scan showed mosaicism which can be seen in pulmonary hypertension.  Plan: -recommend right heart catheterization, she plans to see cardiology later in the month

## 2014-08-29 NOTE — Progress Notes (Signed)
Subjective:    Patient ID: [name], female    DOB: 1941-08-09, 73 y.o.   MRN: [id number]  Synopsis: First referred to the [facility] in 2015 for evaluation of dyspnea. Had pulmonary function test which showed a DLCO disproportionately low compared to a normal total lung capacity. No airflow obstruction. Never smoker. Has diastolic heart failure.  2013 pulmonary function test> ratio 84%, FEV1 1.05 L (53% predicted, 0% change with bronchodilator), total lung capacity 3.82 L (82% predicted), DLCO 11.77 (58% predicted) October 2015 pulmonary function test> ratio 77%, FEV1 1.12 L (55% predicted), Total lung capacity 3.04 L (64% predicted), DLCO 7.08 (32% protected) 07/25/2014 high-resolution CT> no evidence of interstitial lung disease, there is mosaicism suggestive of air trapping or pulmonary hypertension, three-vessel coronary disease, status post bioprosthetic aortic valve replacement with aortic diameter of 4.5 cm  HPI  Chief Complaint  Patient presents with  . Follow-up    pt c/o sob with exertion, no other complaints.  Pt has not been going to pulm rehab. Seeing cardiology on 12/21.  Pt wants to know if she needs to wear 02 24/7 or just qhs and with exertion.    [name] has been using her oxygen more recently and she says that this has been helping with her shortness of breath.  She is only using it with exertion and she says that it really helps.  She is using the oxygen as needed.  She is using the oxygen at night and it helps.  She has not been back to see cardiology since the last visit.  She has an appointment on 09/16/14.  She has been doing well otherwise.  Past Medical History  Diagnosis Date  . History of ventricular tachycardia   . Peripheral edema   . Aortic insufficiency     s/p tissue AVR 2013  . History of gout   . Exogenous obesity   . History of CHF (congestive heart failure)   . Automatic implantable cardiac defibrillator single-chamber-Medtronic  11/04/2011  . Hypertension   . 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 DOE (dyspnea on exertion)   . Stroke 2000  . Renal insufficiency   . Anxiety   . 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 Anemia   . Presence of IVC filter 2000  . Osteopenia 09/01/2013  . Coronary artery disease 2013    non obstructive  . Myocardial infarction 2000  . DVT (deep venous thrombosis)      Review of Systems  Constitutional: Negative for fever, chills and fatigue.  HENT: Negative for nosebleeds, postnasal drip and rhinorrhea.   Respiratory: Negative for cough, shortness of breath and wheezing.   Cardiovascular: Positive for leg swelling. Negative for chest pain and palpitations.       Objective:   Physical Exam  Filed Vitals:   08/29/14 1605  BP: 126/76  Pulse: 68  Height: 5\' 2"  (1.575 m)  Weight: 201 lb (91.173 kg)  SpO2: 97%  RA   Gen: chronically ill appearing, no acute distress HEENT: NCAT, EOMi, OP clear, PULM: CTA B CV: RRR, systolic murmur, no JVD AB: BS+, soft, nontender, Ext: warm, Trace edema, no clubbing, no cyanosis Derm: no rash or skin breakdown Neuro: A&Ox4, MAEW      Assessment & Plan:   Dyspnea As noted previously this I think a right heart catheterization would be helpful here.  Her dyspnea has  worsened this year despite her claim that her leg swelling and weight have not changed.  There is no evidence of airway disease on PFT.  Her CT scan showed mosaicism which can be seen in pulmonary hypertension.  Plan: -recommend right heart catheterization, she plans to see cardiology later in the month      Chronic hypoxemic respiratory failure I advised that she continue to use the oxygen with exertion.    Updated Medication List Outpatient Encounter Prescriptions as of 08/29/2014  Medication Sig  . acetaminophen (TYLENOL) 500 MG tablet Take 500 mg by mouth every 6 (six) hours as needed for moderate  pain.  [name]. allopurinol (ZYLOPRIM) 100 MG tablet TAKE 1 TABLET (100 MG TOTAL) BY MOUTH 2 (TWO) TIMES DAILY.  [name]. amLODipine (NORVASC) 5 MG tablet Take 1 tablet (5 mg total) by mouth daily.  [name]. aspirin EC 81 MG tablet Take 1 tablet (81 mg total) by mouth daily.  . Biotin 5000 MCG CAPS Take 1 capsule (5,000 mcg total) by mouth daily.  . bisoprolol (ZEBETA) 10 MG tablet TAKE 1 TABLET (10 MG TOTAL) BY MOUTH 2 (TWO) TIMES DAILY.  . Calcium Carb-Cholecalciferol (CALCIUM-VITAMIN D) 600-400 MG-UNIT TABS Take 1 tablet by mouth daily.  . CRESTOR 5 MG tablet TAKE 1 TABLET (5 MG TOTAL) BY MOUTH DAILY.  . furosemide (LASIX) 40 MG tablet Take 40 mg by mouth 2 (two) times daily.  . furosemide (LASIX) 40 MG tablet TAKE 1 TABLET TWICE A DAY  . lisinopril (PRINIVIL,ZESTRIL) 10 MG tablet Take 1 tablet (10 mg total) by mouth daily.  . potassium chloride SA (K-DUR,KLOR-CON) 20 MEQ tablet Take 1 tablet (20 mEq total) by mouth 3 (three) times daily.  [name]. senna-docusate (SENOKOT S) 8.6-50 MG per tablet Take 2 tablets by mouth at bedtime. For constipation, also available OTC  . warfarin (COUMADIN) 5 MG tablet Take as directed by Coumadin clinic

## 2014-08-29 NOTE — Patient Instructions (Signed)
Use oxygen when you exert yourself We will see you back in February 2016

## 2014-08-29 NOTE — Assessment & Plan Note (Signed)
I advised that she continue to use the oxygen with exertion.

## 2014-09-05 ENCOUNTER — Telehealth (HOSPITAL_COMMUNITY): Payer: Self-pay

## 2014-09-05 NOTE — Telephone Encounter (Signed)
Contacted patient to inquire about Pulmonary Rehab.  Patient states that she is not interested at this time but even if she was interested, she could not afford it.  Patient states that if her financial situation changes she will be in contact with [place].

## 2014-09-07 ENCOUNTER — Encounter (HOSPITAL_COMMUNITY): Payer: Self-pay | Admitting: Cardiovascular Disease

## 2014-09-18 ENCOUNTER — Encounter: Payer: Self-pay | Admitting: Cardiology

## 2014-09-18 ENCOUNTER — Ambulatory Visit (INDEPENDENT_AMBULATORY_CARE_PROVIDER_SITE_OTHER): Payer: Medicare Other | Admitting: Surgery

## 2014-09-18 ENCOUNTER — Ambulatory Visit (INDEPENDENT_AMBULATORY_CARE_PROVIDER_SITE_OTHER): Payer: Medicare Other | Admitting: Cardiology

## 2014-09-18 ENCOUNTER — Other Ambulatory Visit (INDEPENDENT_AMBULATORY_CARE_PROVIDER_SITE_OTHER): Payer: Medicare Other | Admitting: *Deleted

## 2014-09-18 VITALS — BP 132/78 | HR 52 | Ht 62.0 in | Wt 205.0 lb

## 2014-09-18 DIAGNOSIS — J9611 Chronic respiratory failure with hypoxia: Secondary | ICD-10-CM

## 2014-09-18 DIAGNOSIS — I10 Essential (primary) hypertension: Secondary | ICD-10-CM

## 2014-09-18 DIAGNOSIS — Z952 Presence of prosthetic heart valve: Secondary | ICD-10-CM

## 2014-09-18 DIAGNOSIS — Z954 Presence of other heart-valve replacement: Secondary | ICD-10-CM

## 2014-09-18 DIAGNOSIS — I4891 Unspecified atrial fibrillation: Secondary | ICD-10-CM

## 2014-09-18 DIAGNOSIS — I352 Nonrheumatic aortic (valve) stenosis with insufficiency: Secondary | ICD-10-CM

## 2014-09-18 DIAGNOSIS — I482 Chronic atrial fibrillation, unspecified: Secondary | ICD-10-CM

## 2014-09-18 DIAGNOSIS — E785 Hyperlipidemia, unspecified: Secondary | ICD-10-CM

## 2014-09-18 DIAGNOSIS — Z5181 Encounter for therapeutic drug level monitoring: Secondary | ICD-10-CM

## 2014-09-18 LAB — HEPATIC FUNCTION PANEL
ALBUMIN: 4.2 g/dL (ref 3.5–5.2)
ALT: 13 U/L (ref 0–35)
AST: 17 U/L (ref 0–37)
Alkaline Phosphatase: 71 U/L (ref 39–117)
BILIRUBIN TOTAL: 0.7 mg/dL (ref 0.2–1.2)
Bilirubin, Direct: 0.1 mg/dL (ref 0.0–0.3)
TOTAL PROTEIN: 7.4 g/dL (ref 6.0–8.3)

## 2014-09-18 LAB — LIPID PANEL
Cholesterol: 175 mg/dL (ref 0–200)
HDL: 59 mg/dL (ref >39.00)
LDL CALC: 93 mg/dL (ref 0–99)
NonHDL: 116
Total CHOL/HDL Ratio: 3
Triglycerides: 114 mg/dL (ref 0.0–149.0)
VLDL: 22.8 mg/dL (ref 0.0–40.0)

## 2014-09-18 LAB — BASIC METABOLIC PANEL
BUN: 25 mg/dL — ABNORMAL HIGH (ref 6–23)
CALCIUM: 9.3 mg/dL (ref 8.4–10.5)
CO2: 30 mEq/L (ref 19–32)
Chloride: 104 mEq/L (ref 96–112)
Creatinine, Ser: 1.1 mg/dL (ref 0.4–1.2)
GFR: 50.22 mL/min — AB (ref >60.00)
Glucose, Bld: 88 mg/dL (ref 70–99)
POTASSIUM: 4.1 meq/L (ref 3.5–5.1)
SODIUM: 142 meq/L (ref 135–145)

## 2014-09-18 LAB — POCT INR: INR: 1.7

## 2014-09-18 NOTE — Progress Notes (Signed)
Quick Note:  Please report to patient. The recent labs are stable. Continue same medication and careful diet. Kidney function is better. ______ 

## 2014-09-18 NOTE — Assessment & Plan Note (Signed)
The patient is not having any symptoms from her prosthetic aortic valve.  Recent echo showed normal prosthetic valve function.

## 2014-09-18 NOTE — Progress Notes (Signed)
[name] Date of Birth:  July 02, 1941 [facility] [address] [place], [place]  [address] [phone]        Fax   [phone]   History of Present Illness: This pleasant 73 year old woman is seen for a scheduled four-month office visit. 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 [name]. 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 She also has a dilated left ventricle with marked decrease in left ventricular systolic function. She is now on bisoprolol in place of carvedilol.   She has a good appetite. She now lives with her daughter at the [place]. She remains in chronic atrial fibrillation and is now on Coumadin.  In February 2014 she was hospitalized with respiratory problems and exacerbation of CHF.  Her last echocardiogram was on 11/05/13 and showed an ejection fraction of 45-50%.  The bioprosthetic aortic valve was functioning normally. She was rehospitalized since we last saw her here in the office.  She was hospitalized from 9/21 until 06/23/14.  She was hospitalized with acute on chronic diastolic heart failure.  She underwent an echocardiogram on 06/20/14 which showed an ejection fraction of 50-55% and her prosthetic aortic valve appeared to be functioning normally.  Her pulmonary artery pressure was 40.  She was found to have hypoxemia with effort and is now on home oxygen and is followed by pulmonary. Since last visit she feels that her memory is worse.   Current Outpatient Prescriptions  Medication Sig Dispense Refill  . acetaminophen (TYLENOL) 500 MG tablet Take 500 mg by mouth every 6 (six) hours as needed for moderate pain.    [name] allopurinol (ZYLOPRIM) 100 MG tablet TAKE 1 TABLET (100 MG TOTAL) BY MOUTH 2 (TWO) TIMES DAILY. 60 tablet 5  . amLODipine (NORVASC) 5 MG tablet Take 1 tablet (5 mg total) by mouth daily. 30 tablet 6  . aspirin  EC 81 MG tablet Take 1 tablet (81 mg total) by mouth daily. 90 tablet 3  . Biotin 5000 MCG CAPS Take 1 capsule (5,000 mcg total) by mouth daily. 30 capsule 6  . bisoprolol (ZEBETA) 10 MG tablet TAKE 1 TABLET (10 MG TOTAL) BY MOUTH 2 (TWO) TIMES DAILY. 15 tablet 0  . Calcium Carb-Cholecalciferol (CALCIUM-VITAMIN D) 600-400 MG-UNIT TABS Take 1 tablet by mouth daily.    . CRESTOR 5 MG tablet TAKE 1 TABLET (5 MG TOTAL) BY MOUTH DAILY. 30 tablet 5  . furosemide (LASIX) 40 MG tablet TAKE 1 TABLET TWICE A DAY 60 tablet 5  . lisinopril (PRINIVIL,ZESTRIL) 10 MG tablet Take 1 tablet (10 mg total) by mouth daily. 30 tablet 2  . potassium chloride SA (K-DUR,KLOR-CON) 20 MEQ tablet Take 1 tablet (20 mEq total) by mouth 3 (three) times daily. 90 tablet 11  . senna-docusate (SENOKOT S) 8.6-50 MG per tablet Take 2 tablets by mouth at bedtime. For constipation, also available OTC 60 tablet 3  . warfarin (COUMADIN) 5 MG tablet Take as directed by Coumadin clinic 30 tablet 3   No current facility-administered medications for this visit.    Allergies  Allergen Reactions  . Adhesive [Tape] Itching, Rash and Other (See Comments)    Redness and skin breaks down  . Latex Rash  . Neomycin-Bacitracin Zn-Polymyx Rash    Patient Active Problem List   Diagnosis Date Noted  . Aortic insufficiency and aortic stenosis 01/01/2011    Priority:  High  . Dyspnea 07/03/2014  . Chronic hypoxemic respiratory failure 07/03/2014  . NICM- EF 20% 2013- 45-50% Feb 2015 06/21/2014  . LBBB (left bundle branch block) 06/21/2014  . Chronic anticoagulation- Coumadin 06/21/2014  . Bronchitis with bronchospasm 06/21/2014  . Low back pain 06/13/2014  . Encounter for therapeutic drug monitoring 11/21/2013  . S/P implantation of automatic cardioverter/defibrillator (AICD) 11/21/2013  . CKD (chronic kidney disease), stage III 11/04/2013  . Chronic atrial fibrillation 11/04/2013  . Gout 09/19/2013  . Osteopenia 09/01/2013  . Chronic  constipation 07/11/2013  . Annual physical exam 07/08/2012  . H/O tissue AVR and AO root replacement 2013 04/27/2012  . Atherosclerosis of native arteries of the extremities with intermittent claudication 03/03/2012  . Anemia 02/17/2012  . Chronic systolic congestive heart failure, NYHA class 3 02/14/2012  . Cardiac arrest 11/04/2011  . Implantable cardiac defibrillator single-chamber-Medtronic 11/04/2011  . Alopecia 11/04/2011  . COMBINED HEART FAILURE, ACUTE ON CHRONIC 11/25/2010  . Back pain- s/p recent steroid dose pack 12/19/2009  . Memory loss 09/05/2009  . VENOUS INSUFFICIENCY 01/19/2009  . HYPERLIPIDEMIA 01/02/2009  . GOUT 01/02/2009  . HYPERTENSION 01/02/2009    History  Smoking status  . Never Smoker   Smokeless tobacco  . Never Used    History  Alcohol Use No    Family History  Problem Relation Age of Onset  . Hypertension Father   . Cancer Father   . Coronary artery disease Father 21  . Cancer Son     pancreas, spine    Review of Systems: Constitutional: no fever chills diaphoresis or fatigue or change in weight.  Head and neck: no hearing loss, no epistaxis, no photophobia or visual disturbance. Respiratory: No cough, shortness of breath or wheezing. Cardiovascular: No chest pain peripheral edema, palpitations. Gastrointestinal: No abdominal distention, no abdominal pain, no change in bowel habits hematochezia or melena. Genitourinary: No dysuria, no frequency, no urgency, no nocturia. Musculoskeletal:No arthralgias, no back pain, no gait disturbance or myalgias. Neurological: No dizziness, no headaches, no numbness, no seizures, no syncope, no weakness, no tremors. Hematologic: No lymphadenopathy, no easy bruising. Psychiatric: No confusion, no hallucinations, no sleep disturbance.    Physical Exam: Filed Vitals:   09/18/14 1201  BP: 132/78  Pulse: 52   the general appearance reveals a well-developed well-nourished elderly woman in no  distress.The head and neck exam reveals pupils equal and reactive.  Extraocular movements are full.  There is no scleral icterus.  The mouth and pharynx are normal.  The neck is supple.  The carotids reveal no bruits.  The jugular venous pressure is normal.  The  thyroid is not enlarged.  There is no lymphadenopathy.  The chest is clear to percussion and auscultation.  There are no rales or rhonchi.  Expansion of the chest is symmetrical.  The precordium is quiet.  The pulse is irregularly irregular  The first heart sound is normal.  The second heart sound is physiologically split.  There is grade 2/6 systolic ejection murmur across the prosthetic aortic valve.  No diastolic murmur heard.  No gallop.  No rub.  There is no abnormal lift or heave.  The abdomen is soft and nontender.  The bowel sounds are normal.  The liver and spleen are not enlarged.  There are no abdominal masses.  There are no abdominal bruits.  Extremities reveal good pedal pulses.  There is no phlebitis and there is 1+ edema.  There is no cyanosis or clubbing.  Strength is normal  and symmetrical in all extremities.  There is no lateralizing weakness.  There are no sensory deficits.  The skin is warm and dry.  There is no rash.  EKG today shows atrial fibrillation.  Her underlying rhythm is intermittent ventricular pacing at 52/min. native QRS beats show widespread ST-T wave abnormalities.   Assessment / Plan: 1. permanent atrial fibrillation 2. ICD in place.  Remote history of cardiac arrest secondary to ventricular tachycardia. 3. status post replacement of native aortic valve and ascending root and placement of a bioprosthetic aortic valve, doing well postoperatively. 4.  Essential hypertension. 5. Hyperlipidemia 6. chronic kidney disease stage III 7.  Chronic diastolic heart failure  Plan: Continue same medication.  Work harder on diet and weight loss.  Recheck in 4 months for office visit EKG lipid panel hepatic function panel  and basal metabolic panel.

## 2014-09-18 NOTE — Assessment & Plan Note (Signed)
The patient is now on home oxygen 24/7 and she is followed by pulmonary

## 2014-09-18 NOTE — Patient Instructions (Signed)
Work harder on diet and weight loss  Your physician recommends that you continue on your current medications as directed. Please refer to the Current Medication list given to you today.  Your physician wants you to follow-up in: 4 months with fasting labs (lp/bmet/hfp) You will receive a reminder letter in the mail two months in advance. If you don't receive a letter, please call our office to schedule the follow-up appointment.

## 2014-09-18 NOTE — Assessment & Plan Note (Signed)
On long-term Coumadin.  No recent bleeding problems.  She has had no TIA symptoms.

## 2014-09-25 ENCOUNTER — Telehealth: Payer: Self-pay | Admitting: Pulmonary Disease

## 2014-09-25 DIAGNOSIS — R0600 Dyspnea, unspecified: Secondary | ICD-10-CM

## 2014-09-25 NOTE — Telephone Encounter (Signed)
LMOMTCB x 1 

## 2014-09-26 NOTE — Telephone Encounter (Signed)
Spoke with [name]- Aware that order has been sent for humidifier for home O2 for pt. Nothing further needed.

## 2014-09-26 NOTE — Telephone Encounter (Signed)
Spoke with [name], states that patient's nose is very dry from O2, wanting to know if there is an order for a humidifier that can be hooked up to her concentrator. Pt is having increased nose bleeds and nasal pain. Pt is requesting some rec's for something to use in nose (cream) to give some relief.  Dr Kendrick Fries is out on vacation this week, will send this to Dr Sherene Sires (DOD) to advise on recommendations and order for patient. Thanks.

## 2014-09-26 NOTE — Telephone Encounter (Signed)
Really just needs humdified 02 and that should correct it - I don't have any specific recs re nose creams

## 2014-10-04 ENCOUNTER — Ambulatory Visit (INDEPENDENT_AMBULATORY_CARE_PROVIDER_SITE_OTHER): Payer: Medicare Other | Admitting: *Deleted

## 2014-10-04 ENCOUNTER — Encounter: Payer: Self-pay | Admitting: Internal Medicine

## 2014-10-04 ENCOUNTER — Ambulatory Visit (INDEPENDENT_AMBULATORY_CARE_PROVIDER_SITE_OTHER): Payer: Medicare Other | Admitting: Pharmacist

## 2014-10-04 DIAGNOSIS — I469 Cardiac arrest, cause unspecified: Secondary | ICD-10-CM | POA: Diagnosis not present

## 2014-10-04 DIAGNOSIS — I352 Nonrheumatic aortic (valve) stenosis with insufficiency: Secondary | ICD-10-CM

## 2014-10-04 DIAGNOSIS — Z954 Presence of other heart-valve replacement: Secondary | ICD-10-CM

## 2014-10-04 DIAGNOSIS — Z952 Presence of prosthetic heart valve: Secondary | ICD-10-CM

## 2014-10-04 DIAGNOSIS — Z5181 Encounter for therapeutic drug level monitoring: Secondary | ICD-10-CM

## 2014-10-04 DIAGNOSIS — I5043 Acute on chronic combined systolic (congestive) and diastolic (congestive) heart failure: Secondary | ICD-10-CM | POA: Diagnosis not present

## 2014-10-04 DIAGNOSIS — I4891 Unspecified atrial fibrillation: Secondary | ICD-10-CM

## 2014-10-04 LAB — MDC_IDC_ENUM_SESS_TYPE_INCLINIC
Battery Voltage: 2.97 V
Brady Statistic RV Percent Paced: 20.31 %
Date Time Interrogation Session: 20160106120730
HighPow Impedance: 40 Ohm
HighPow Impedance: 53 Ohm
Lead Channel Impedance Value: 760 Ohm
Lead Channel Pacing Threshold Pulse Width: 0.8 ms
Lead Channel Setting Pacing Amplitude: 2.5 V
Lead Channel Setting Sensing Sensitivity: 0.3 mV
MDC IDC MSMT LEADCHNL RV PACING THRESHOLD AMPLITUDE: 1 V
MDC IDC MSMT LEADCHNL RV SENSING INTR AMPL: 11.3152
MDC IDC SET LEADCHNL RV PACING PULSEWIDTH: 0.8 ms
Zone Setting Detection Interval: 290 ms
Zone Setting Detection Interval: 330 ms
Zone Setting Detection Interval: 450 ms

## 2014-10-04 LAB — POCT INR: INR: 1.6

## 2014-10-04 NOTE — Progress Notes (Signed)
ICD check in clinic. Normal device function. Thresholds and sensing consistent with previous device measurements. Impedance trends stable over time. No evidence of any ventricular arrhythmias.  Histogram distribution appropriate for patient and level of activity. No changes made this session.  Optivol and thoracic impedance abnormal 10/24 ongoing.  Patient remains asymptomatic and will add an extra dose of furosemide as needed.   Device programmed at appropriate safety margins. Device programmed to optimize intrinsic conduction.  Patient education completed including shock plan. Alert tones/vibration demonstrated for patient.  ROV in April with Dr. Graciela Husbands.

## 2014-10-09 LAB — COLOGUARD: COLOGUARD: NEGATIVE

## 2014-10-17 ENCOUNTER — Ambulatory Visit (INDEPENDENT_AMBULATORY_CARE_PROVIDER_SITE_OTHER): Payer: Medicare Other | Admitting: *Deleted

## 2014-10-17 DIAGNOSIS — I352 Nonrheumatic aortic (valve) stenosis with insufficiency: Secondary | ICD-10-CM

## 2014-10-17 DIAGNOSIS — I4891 Unspecified atrial fibrillation: Secondary | ICD-10-CM

## 2014-10-17 DIAGNOSIS — Z952 Presence of prosthetic heart valve: Secondary | ICD-10-CM

## 2014-10-17 DIAGNOSIS — Z5181 Encounter for therapeutic drug level monitoring: Secondary | ICD-10-CM

## 2014-10-17 DIAGNOSIS — Z954 Presence of other heart-valve replacement: Secondary | ICD-10-CM

## 2014-10-17 LAB — POCT INR: INR: 1.7

## 2014-10-26 ENCOUNTER — Other Ambulatory Visit: Payer: Self-pay | Admitting: Internal Medicine

## 2014-10-26 ENCOUNTER — Other Ambulatory Visit: Payer: Self-pay | Admitting: Cardiology

## 2014-10-31 ENCOUNTER — Ambulatory Visit (INDEPENDENT_AMBULATORY_CARE_PROVIDER_SITE_OTHER): Payer: Medicare Other | Admitting: *Deleted

## 2014-10-31 DIAGNOSIS — Z5181 Encounter for therapeutic drug level monitoring: Secondary | ICD-10-CM

## 2014-10-31 DIAGNOSIS — Z954 Presence of other heart-valve replacement: Secondary | ICD-10-CM

## 2014-10-31 DIAGNOSIS — I352 Nonrheumatic aortic (valve) stenosis with insufficiency: Secondary | ICD-10-CM

## 2014-10-31 DIAGNOSIS — I4891 Unspecified atrial fibrillation: Secondary | ICD-10-CM

## 2014-10-31 DIAGNOSIS — Z952 Presence of prosthetic heart valve: Secondary | ICD-10-CM

## 2014-10-31 LAB — POCT INR: INR: 1.9

## 2014-10-31 MED ORDER — WARFARIN SODIUM 5 MG PO TABS: ORAL_TABLET | ORAL | Status: DC

## 2014-11-14 ENCOUNTER — Ambulatory Visit (INDEPENDENT_AMBULATORY_CARE_PROVIDER_SITE_OTHER): Payer: Medicare Other | Admitting: *Deleted

## 2014-11-14 DIAGNOSIS — Z5181 Encounter for therapeutic drug level monitoring: Secondary | ICD-10-CM

## 2014-11-14 DIAGNOSIS — I4891 Unspecified atrial fibrillation: Secondary | ICD-10-CM

## 2014-11-14 DIAGNOSIS — Z954 Presence of other heart-valve replacement: Secondary | ICD-10-CM

## 2014-11-14 DIAGNOSIS — I352 Nonrheumatic aortic (valve) stenosis with insufficiency: Secondary | ICD-10-CM

## 2014-11-14 DIAGNOSIS — Z952 Presence of prosthetic heart valve: Secondary | ICD-10-CM

## 2014-11-14 LAB — POCT INR: INR: 2.3

## 2014-12-05 ENCOUNTER — Ambulatory Visit (INDEPENDENT_AMBULATORY_CARE_PROVIDER_SITE_OTHER): Payer: Medicare Other | Admitting: *Deleted

## 2014-12-05 DIAGNOSIS — I4891 Unspecified atrial fibrillation: Secondary | ICD-10-CM

## 2014-12-05 DIAGNOSIS — Z954 Presence of other heart-valve replacement: Secondary | ICD-10-CM

## 2014-12-05 DIAGNOSIS — Z952 Presence of prosthetic heart valve: Secondary | ICD-10-CM

## 2014-12-05 DIAGNOSIS — Z5181 Encounter for therapeutic drug level monitoring: Secondary | ICD-10-CM

## 2014-12-05 DIAGNOSIS — I352 Nonrheumatic aortic (valve) stenosis with insufficiency: Secondary | ICD-10-CM

## 2014-12-05 LAB — POCT INR: INR: 3.1

## 2014-12-26 ENCOUNTER — Ambulatory Visit (INDEPENDENT_AMBULATORY_CARE_PROVIDER_SITE_OTHER): Payer: Medicare Other | Admitting: Pharmacist

## 2014-12-26 DIAGNOSIS — Z5181 Encounter for therapeutic drug level monitoring: Secondary | ICD-10-CM

## 2014-12-26 DIAGNOSIS — Z954 Presence of other heart-valve replacement: Secondary | ICD-10-CM

## 2014-12-26 DIAGNOSIS — I352 Nonrheumatic aortic (valve) stenosis with insufficiency: Secondary | ICD-10-CM

## 2014-12-26 DIAGNOSIS — Z952 Presence of prosthetic heart valve: Secondary | ICD-10-CM

## 2014-12-26 DIAGNOSIS — I4891 Unspecified atrial fibrillation: Secondary | ICD-10-CM | POA: Diagnosis not present

## 2014-12-26 LAB — POCT INR: INR: 2.4

## 2015-01-23 ENCOUNTER — Ambulatory Visit (INDEPENDENT_AMBULATORY_CARE_PROVIDER_SITE_OTHER): Payer: Medicare Other | Admitting: Internal Medicine

## 2015-01-23 ENCOUNTER — Ambulatory Visit (INDEPENDENT_AMBULATORY_CARE_PROVIDER_SITE_OTHER): Payer: Medicare Other

## 2015-01-23 ENCOUNTER — Encounter: Payer: Self-pay | Admitting: Internal Medicine

## 2015-01-23 VITALS — BP 177/108 | HR 60 | Ht 62.0 in | Wt 207.6 lb

## 2015-01-23 DIAGNOSIS — I352 Nonrheumatic aortic (valve) stenosis with insufficiency: Secondary | ICD-10-CM

## 2015-01-23 DIAGNOSIS — I469 Cardiac arrest, cause unspecified: Secondary | ICD-10-CM

## 2015-01-23 DIAGNOSIS — I482 Chronic atrial fibrillation, unspecified: Secondary | ICD-10-CM

## 2015-01-23 DIAGNOSIS — Z954 Presence of other heart-valve replacement: Secondary | ICD-10-CM

## 2015-01-23 DIAGNOSIS — Z5181 Encounter for therapeutic drug level monitoring: Secondary | ICD-10-CM | POA: Diagnosis not present

## 2015-01-23 DIAGNOSIS — I5022 Chronic systolic (congestive) heart failure: Secondary | ICD-10-CM | POA: Diagnosis not present

## 2015-01-23 DIAGNOSIS — I4891 Unspecified atrial fibrillation: Secondary | ICD-10-CM

## 2015-01-23 DIAGNOSIS — I4821 Permanent atrial fibrillation: Secondary | ICD-10-CM

## 2015-01-23 DIAGNOSIS — Z952 Presence of prosthetic heart valve: Secondary | ICD-10-CM

## 2015-01-23 DIAGNOSIS — Z4502 Encounter for adjustment and management of automatic implantable cardiac defibrillator: Secondary | ICD-10-CM

## 2015-01-23 DIAGNOSIS — I447 Left bundle-branch block, unspecified: Secondary | ICD-10-CM

## 2015-01-23 LAB — POCT INR: INR: 2.3

## 2015-01-23 LAB — TSH: TSH: 1.97 u[IU]/mL (ref 0.35–4.50)

## 2015-01-23 MED ORDER — AMLODIPINE BESYLATE 10 MG PO TABS: 10.0000 mg | ORAL_TABLET | Freq: Every day | ORAL | Status: DC

## 2015-01-23 NOTE — Patient Instructions (Addendum)
Medication Instructions:  Your physician has recommended you make the following change in your medication:  1) INCREASE Amlodipine 10 mg daily  Labwork: TSH  Testing/Procedures: None  Follow-Up: Your physician recommends that you schedule a follow-up appointment in: 3 months with the Device Clinic and in 12 months with Dr.Klein  Any Other Special Instructions Will Be Listed Below (If Applicable).

## 2015-01-23 NOTE — Progress Notes (Signed)
Patient Care Team: Sandford Craze, NP as PCP - General (Internal Medicine) Duke Salvia, MD (Cardiology) [name], MD (Cardiology)   HPI  [name] is a 74 y.o. female is seen in followup for aborted cardiac arrest s/p implanted ICD. 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 She underwent aortic valve  Bioprosthetic replacement fall 2013. She is significant dilatation of her LV   She was hospitalized in February for multifactorial dyspnea including pulmonary edema, pneumonia and some bronchitis.  Her carvedilol was changed to bisoprolol; her exercise tolerance has remained somewhat limited.  She's had 2 interval episodes of syncope. One occurred getting out of the bathtub;  the other occurred on her way to the bathroom to urinate at night. There was no warning with the former and there is no recollection of the latter. She denies orthostatic lightheadedness.  She is struggling with her weight. He continues to go up despite efforts to control her diet. There is some swelling.    LHC (03/2012): pCFX 30.  Carotid US (03/2012): No sig ICA stenosis.  Echo (07/2012): EF 25-30%.  Echocardiogram 9/15 demonstrated EF of 50-55% with a bioprosthetic aortic valve replacement     Past Medical History  Diagnosis Date  . History of ventricular tachycardia   . Peripheral edema   . Aortic insufficiency     s/p tissue AVR 2013  . History of gout   . Exogenous obesity   . History of CHF (congestive heart failure)   . 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 Hypertension   . 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 DOE (dyspnea on exertion)   . Stroke 2000  . Renal insufficiency   . Anxiety   . 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 Anemia   . Presence of IVC filter 2000  . Osteopenia  09/01/2013  . Coronary artery disease 2013    non obstructive  . Myocardial infarction 2000  . DVT (deep venous thrombosis)     Past Surgical History  Procedure Laterality Date  . Cardiac defibrillator placement  2013    MDT ICD  . Cataract extraction    . Embolectomy  02/15/2012    Procedure: EMBOLECTOMY;  Surgeon: [name], MD;  Location: [facility] OR;  Service: Vascular;  Laterality: Left;  left popliteal embolectomy with drain placement  . Eye surgery  2012    removal of cataracts bilaterally  . Cardiac catheterization  7/13    non obstructive CAD  . Tonsillectomy    . Aortic valve replacement  04/12/2012    Procedure: AORTIC VALVE REPLACEMENT (AVR);  Surgeon: Delight Ovens, MD;  Location: [facility] OR;  Service: Open Heart Surgery;  Laterality: N/A;  with nitric oxide  . Thoracic aortic aneurysm repair  04/12/2012    Procedure: THORACIC ASCENDING ANEURYSM REPAIR (AAA);  Surgeon: Delight Ovens, MD;  Location: [facility] OR;  Service: Open Heart Surgery;  Laterality: N/A;  . Clot removal  5.2013    blood clot removed from the back of the left leg  . Stomach surgery      lap band placed and later removed  . Left heart catheterization with coronary angiogram N/A 04/08/2012    Procedure: LEFT HEART CATHETERIZATION WITH CORONARY ANGIOGRAM;  Surgeon: Tonny Bollman, MD;  Location: [facility] CATH LAB;  Service: Cardiovascular;  Laterality: N/A;    Current Outpatient Prescriptions  Medication Sig Dispense Refill  . acetaminophen (TYLENOL) 500 MG tablet Take 500 mg by mouth every 6 (six) hours as needed for moderate pain.    [name] allopurinol (ZYLOPRIM) 100 MG tablet TAKE 1 TABLET (100 MG TOTAL) BY MOUTH 2 (TWO) TIMES DAILY. 60 tablet 3  . amLODipine (NORVASC) 5 MG tablet Take 1 tablet (5 mg total) by mouth daily. 30 tablet 6  . Biotin 5000 MCG CAPS Take 1 capsule (5,000 mcg total) by mouth daily. 30 capsule 6  . bisoprolol (ZEBETA) 10 MG tablet TAKE 1 TABLET (10 MG TOTAL) BY MOUTH 2 (TWO) TIMES  DAILY. 60 tablet 1  . Calcium Carb-Cholecalciferol (CALCIUM-VITAMIN D) 600-400 MG-UNIT TABS Take 1 tablet by mouth daily.    . CRESTOR 5 MG tablet TAKE 1 TABLET (5 MG TOTAL) BY MOUTH DAILY. 30 tablet 3  . [facility] ASPIRIN 81 MG EC tablet TAKE 1 TABLET (81 MG TOTAL) BY MOUTH DAILY. 90 tablet 0  . furosemide (LASIX) 40 MG tablet TAKE 1 TABLET TWICE A DAY 60 tablet 3  . lisinopril (PRINIVIL,ZESTRIL) 10 MG tablet Take 1 tablet (10 mg total) by mouth daily. 30 tablet 2  . potassium chloride SA (K-DUR,KLOR-CON) 20 MEQ tablet Take 1 tablet (20 mEq total) by mouth 3 (three) times daily. 90 tablet 11  . senna-docusate (SENOKOT S) 8.6-50 MG per tablet Take 2 tablets by mouth at bedtime. For constipation, also available OTC 60 tablet 3  . warfarin (COUMADIN) 5 MG tablet Take as directed by Coumadin clinic 30 tablet 3   No current facility-administered medications for this visit.    Allergies  Allergen Reactions  . Adhesive [Tape] Itching, Rash and Other (See Comments)    Redness and skin breaks down  . Latex Rash  . Neomycin-Bacitracin Zn-Polymyx Rash    Review of Systems negative except from HPI and PMH  Physical Exam BP 177/108 mmHg  Pulse 60  Ht  (1.575 m)  Wt 207 lb 9.6 oz (94.167 kg)  BMI 37.96 kg/m2 Well developed and very oberse in no acute distress HENT normal E scleral and icterus clear Neck Supple JVP flat; carotids brisk and full Clear to ausculation Device pocket well healed; without hematoma or erythema.  There is no tethering  *Regular rate and rhythm, no murmurs gallops or rub Soft with active bowel sounds No clubbing cyanosis tr  Edema Alert and oriented, grossly normal motor and sensory function Skin Warm and Dry    Assessment and  Plan  Atrial fibrillation-permanent  Cardiac arrest-aborted  ICD The patient's device was interrogated.  The information was reviewed. No changes were made in the programming.    HFpEF  Hypertension   Syncope  Presyncope is  worrisome. It is noteworthy that there have been no arrhythmic events. This would implicate vasomotor instability as the cause of her events. They are however, while statistically frequently associated with as an nocturnal urination sufficiently infrequent that is going to be hard to decrease the risks of these apart from being attentive to being careful. I suggested that she 0.2-forage blocks underneath the head of her bed that will help with nocturnal standing. She is reminded to sit for prolonged periods of time after past.  The patient is relatively euvolemic. We'll continue her on her current diuretics.   Her blood pressure is poorly controlled. We will increase her amlodipine today 5--10. She remains on anticoagulation for her atrial fibrillation. Her rate is well-controlled  I  will defer to Dr. Patty Sermons, but it might be reasonable to add aspirin to her regime given her atrial fibrillation in the context of her bioprosthetic AVR

## 2015-01-24 LAB — MDC_IDC_ENUM_SESS_TYPE_INCLINIC
Battery Voltage: 2.9 V
Brady Statistic RV Percent Paced: 19.9 %
Lead Channel Pacing Threshold Pulse Width: 0.8 ms
Lead Channel Setting Pacing Pulse Width: 0.8 ms
MDC IDC MSMT LEADCHNL RV IMPEDANCE VALUE: 808 Ohm
MDC IDC MSMT LEADCHNL RV PACING THRESHOLD AMPLITUDE: 1 V
MDC IDC MSMT LEADCHNL RV SENSING INTR AMPL: 13 mV
MDC IDC SET LEADCHNL RV PACING AMPLITUDE: 2.5 V
MDC IDC SET LEADCHNL RV SENSING SENSITIVITY: 0.3 mV
MDC IDC SET ZONE DETECTION INTERVAL: 290 ms
Zone Setting Detection Interval: 330 ms
Zone Setting Detection Interval: 450 ms

## 2015-01-27 ENCOUNTER — Other Ambulatory Visit: Payer: Self-pay | Admitting: Cardiology

## 2015-01-29 ENCOUNTER — Encounter: Payer: Self-pay | Admitting: Internal Medicine

## 2015-02-01 ENCOUNTER — Other Ambulatory Visit: Payer: Self-pay | Admitting: Cardiology

## 2015-02-01 ENCOUNTER — Other Ambulatory Visit: Payer: Self-pay | Admitting: Physician Assistant

## 2015-02-20 ENCOUNTER — Encounter: Payer: Self-pay | Admitting: Cardiology

## 2015-02-20 ENCOUNTER — Other Ambulatory Visit (INDEPENDENT_AMBULATORY_CARE_PROVIDER_SITE_OTHER): Payer: Medicare Other | Admitting: *Deleted

## 2015-02-20 ENCOUNTER — Ambulatory Visit (INDEPENDENT_AMBULATORY_CARE_PROVIDER_SITE_OTHER): Payer: Medicare Other | Admitting: *Deleted

## 2015-02-20 ENCOUNTER — Ambulatory Visit (INDEPENDENT_AMBULATORY_CARE_PROVIDER_SITE_OTHER): Payer: Medicare Other | Admitting: Cardiology

## 2015-02-20 VITALS — BP 124/60 | HR 62 | Ht 62.0 in | Wt 211.8 lb

## 2015-02-20 DIAGNOSIS — I4891 Unspecified atrial fibrillation: Secondary | ICD-10-CM

## 2015-02-20 DIAGNOSIS — I5022 Chronic systolic (congestive) heart failure: Secondary | ICD-10-CM

## 2015-02-20 DIAGNOSIS — Z5181 Encounter for therapeutic drug level monitoring: Secondary | ICD-10-CM

## 2015-02-20 DIAGNOSIS — Z954 Presence of other heart-valve replacement: Secondary | ICD-10-CM

## 2015-02-20 DIAGNOSIS — Z952 Presence of prosthetic heart valve: Secondary | ICD-10-CM

## 2015-02-20 DIAGNOSIS — I482 Chronic atrial fibrillation, unspecified: Secondary | ICD-10-CM

## 2015-02-20 DIAGNOSIS — I10 Essential (primary) hypertension: Secondary | ICD-10-CM | POA: Diagnosis not present

## 2015-02-20 DIAGNOSIS — I352 Nonrheumatic aortic (valve) stenosis with insufficiency: Secondary | ICD-10-CM

## 2015-02-20 LAB — LIPID PANEL
CHOLESTEROL: 170 mg/dL (ref 0–200)
HDL: 70.5 mg/dL (ref >39.00)
LDL CALC: 79 mg/dL (ref 0–99)
NonHDL: 99.5
Total CHOL/HDL Ratio: 2
Triglycerides: 104 mg/dL (ref 0.0–149.0)
VLDL: 20.8 mg/dL (ref 0.0–40.0)

## 2015-02-20 LAB — HEPATIC FUNCTION PANEL
ALT: 12 U/L (ref 0–35)
AST: 16 U/L (ref 0–37)
Albumin: 4.4 g/dL (ref 3.5–5.2)
Alkaline Phosphatase: 85 U/L (ref 39–117)
Bilirubin, Direct: 0.1 mg/dL (ref 0.0–0.3)
Total Bilirubin: 0.4 mg/dL (ref 0.2–1.2)
Total Protein: 7.6 g/dL (ref 6.0–8.3)

## 2015-02-20 LAB — BASIC METABOLIC PANEL
BUN: 25 mg/dL — AB (ref 6–23)
CALCIUM: 9.2 mg/dL (ref 8.4–10.5)
CO2: 33 meq/L — AB (ref 19–32)
Chloride: 104 mEq/L (ref 96–112)
Creatinine, Ser: 1.21 mg/dL — ABNORMAL HIGH (ref 0.40–1.20)
GFR: 46.35 mL/min — ABNORMAL LOW (ref >60.00)
Glucose, Bld: 92 mg/dL (ref 70–99)
POTASSIUM: 3.9 meq/L (ref 3.5–5.1)
Sodium: 142 mEq/L (ref 135–145)

## 2015-02-20 LAB — POCT INR: INR: 2.5

## 2015-02-20 NOTE — Progress Notes (Signed)
Quick Note:  Please report to patient. The recent labs are stable. Continue same medication and careful diet. ______ 

## 2015-02-20 NOTE — Progress Notes (Signed)
Cardiology Office Note   Date:  02/20/2015   ID:  MICKAILA FINA, DOB 12-09-1940, MRN [id number]  PCP:  Lemont Fillers., NP  Cardiologist: [name] MD  No chief complaint on file.     History of Present Illness: [name] is a 74 y.o. female who presents for a four-month follow-up visit  This pleasant 74 year old woman is seen for a scheduled four-month office visit. 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 [name]. 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 She also has a dilated left ventricle with marked decrease in left ventricular systolic function. She is now on bisoprolol in place of carvedilol. She has a good appetite. She now lives with her daughter at the [facility] [place] [facility]. She remains in chronic atrial fibrillation and is now on Coumadin.  In February 2014 she was hospitalized with respiratory problems and exacerbation of CHF. Her last echocardiogram was on 11/05/13 and showed an ejection fraction of 45-50%. The bioprosthetic aortic valve was functioning normally. She was  hospitalized from 9/21 until 06/23/14. She was hospitalized with acute on chronic diastolic heart failure. She underwent an echocardiogram on 06/20/14 which showed an ejection fraction of 50-55% and her prosthetic aortic valve appeared to be functioning normally. Her pulmonary artery pressure was 40. She was found to have hypoxemia with effort and is now on home oxygen and is followed by pulmonary.  Normally she just uses the oxygen at night when she sleeps but not during the day  Dr. Kendrick Fries is her pulmonologist. Since last visit she feels that her memory is worse.  Past Medical History  Diagnosis Date  . History of ventricular tachycardia   . Peripheral edema   . Aortic insufficiency     s/p tissue AVR 2013  . History of gout   . Exogenous obesity   . History of CHF  (congestive heart failure)   . 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 Hypertension   . 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 DOE (dyspnea on exertion)   . Stroke 2000  . Renal insufficiency   . Anxiety   . 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 Anemia   . Presence of IVC filter 2000  . Osteopenia 09/01/2013  . Coronary artery disease 2013    non obstructive  . Myocardial infarction 2000  . DVT (deep venous thrombosis)     Past Surgical History  Procedure Laterality Date  . Cardiac defibrillator placement  2013    MDT ICD  . Cataract extraction    . Embolectomy  02/15/2012    Procedure: EMBOLECTOMY;  Surgeon: [name], MD;  Location: [facility] OR;  Service: Vascular;  Laterality: Left;  left popliteal embolectomy with drain placement  . Eye surgery  2012    removal of cataracts bilaterally  . Cardiac catheterization  7/13    non obstructive CAD  . Tonsillectomy    . Aortic valve replacement  04/12/2012    Procedure: AORTIC VALVE REPLACEMENT (AVR);  Surgeon: Delight Ovens, MD;  Location: [facility] OR;  Service: Open Heart Surgery;  Laterality: N/A;  with nitric oxide  . Thoracic aortic aneurysm repair  04/12/2012    Procedure: THORACIC ASCENDING ANEURYSM REPAIR (AAA);  Surgeon: Delight Ovens, MD;  Location: [facility] OR;  Service: Open  Heart Surgery;  Laterality: N/A;  . Clot removal  5.2013    blood clot removed from the back of the left leg  . Stomach surgery      lap band placed and later removed  . Left heart catheterization with coronary angiogram N/A 04/08/2012    Procedure: LEFT HEART CATHETERIZATION WITH CORONARY ANGIOGRAM;  Surgeon: Tonny Bollman, MD;  Location: [facility] CATH LAB;  Service: Cardiovascular;  Laterality: N/A;     Current Outpatient Prescriptions  Medication Sig Dispense Refill  . acetaminophen (TYLENOL) 500 MG tablet Take 500 mg by mouth every 6 (six)  hours as needed for moderate pain.    [name] allopurinol (ZYLOPRIM) 100 MG tablet TAKE 1 TABLET (100 MG TOTAL) BY MOUTH 2 (TWO) TIMES DAILY. 60 tablet 11  . amLODipine (NORVASC) 10 MG tablet Take 1 tablet (10 mg total) by mouth daily. 30 tablet 11  . Biotin 5000 MCG CAPS Take 1 capsule (5,000 mcg total) by mouth daily. 30 capsule 6  . bisoprolol (ZEBETA) 10 MG tablet TAKE 1 TABLET BY MOUTH TWICE A DAY 60 tablet 11  . Calcium Carb-Cholecalciferol (CALCIUM-VITAMIN D) 600-400 MG-UNIT TABS Take 1 tablet by mouth daily.    . CRESTOR 5 MG tablet TAKE 1 TABLET BY MOUTH EVERY DAY 30 tablet 11  . CVS ASPIRIN 81 MG EC tablet TAKE 1 TABLET (81 MG TOTAL) BY MOUTH DAILY. 90 tablet 0  . furosemide (LASIX) 40 MG tablet TAKE 1 TABLET TWICE A DAY 60 tablet 11  . KLOR-CON M20 20 MEQ tablet TAKE 1 TABLET BY MOUTH 3 TIMES A DAY 90 tablet 6  . lisinopril (PRINIVIL,ZESTRIL) 10 MG tablet Take 1 tablet (10 mg total) by mouth daily. 30 tablet 2  . senna-docusate (SENOKOT S) 8.6-50 MG per tablet Take 2 tablets by mouth at bedtime. For constipation, also available OTC 60 tablet 3  . warfarin (COUMADIN) 5 MG tablet Take as directed by Coumadin clinic 30 tablet 3   No current facility-administered medications for this visit.    Allergies:   Adhesive; Latex; and Neomycin-bacitracin zn-polymyx    Social History:  The patient  reports that she has never smoked. She has never used smokeless tobacco. She reports that she does not drink alcohol or use illicit drugs.   Family History:  The patient's family history includes Cancer in her father and son; Coronary artery disease (age of onset: 14) in her father; Hypertension in her father.    ROS:  Please see the history of present illness.   Otherwise, review of systems are positive for none.   All other systems are reviewed and negative.    PHYSICAL EXAM: VS:  BP 124/60 mmHg  Pulse 62  Ht  (1.575 m)  Wt 211 lb 12.8 oz (96.072 kg)  BMI 38.73 kg/m2 , BMI Body mass index  is 38.73 kg/(m^2). GEN: Well nourished, well developed, in no acute distress HEENT: normal Neck: no JVD, carotid bruits, or masses Cardiac: Irregularly irregular rhythm.  Grade 2/6 systolic ejection murmur at the base.  No aortic insufficiency murmur.  No S3 gallop.  No significant peripheral edema. Respiratory:  clear to auscultation bilaterally, normal work of breathing GI: soft, nontender, nondistended, + BS MS: no deformity or atrophy Skin: warm and dry, no rash Neuro:  Strength and sensation are intact Psych: euthymic mood, full affect   EKG:  EKG is not ordered today.    Recent Labs: 06/20/2014: Hemoglobin 12.5; Platelets 147* 07/25/2014: Pro B Natriuretic peptide (BNP) 284.0* 01/23/2015:  TSH 1.97 02/20/2015: ALT 12; BUN 25*; Creatinine 1.21*; Potassium 3.9; Sodium 142    Lipid Panel    Component Value Date/Time   CHOL 170 02/20/2015 0843   TRIG 104.0 02/20/2015 0843   HDL 70.50 02/20/2015 0843   CHOLHDL 2 02/20/2015 0843   VLDL 20.8 02/20/2015 0843   LDLCALC 79 02/20/2015 0843   LDLDIRECT 152.6 09/01/2011 1305      Wt Readings from Last 3 Encounters:  02/20/15 211 lb 12.8 oz (96.072 kg)  01/23/15 207 lb 9.6 oz (94.167 kg)  09/18/14 205 lb (92.987 kg)        ASSESSMENT AND PLAN:  1. permanent atrial fibrillation 2. ICD in place. Remote history of cardiac arrest secondary to ventricular tachycardia. 3. status post replacement of native aortic valve and ascending root and placement of a bioprosthetic aortic valve, doing well postoperatively. 4. Essential hypertension. 5. Hyperlipidemia 6. chronic kidney disease stage III 7. Chronic diastolic heart failure 8.  Chronic hypoxic respiratory failure--she is on home oxygen at night.  Dr. Kendrick Fries is her pulmonologist.  Plan: Continue same medication. Work harder on diet and weight loss. We are checking lipid panel hepatic function panel and basal metabolic panel today.  Recheck in 4 months for office visit EKG  and basal metabolic panel.   Current medicines are reviewed at length with the patient today.  The patient does not have concerns regarding medicines.  The following changes have been made:  no change  Labs/ tests ordered today include:   Orders Placed This Encounter  Procedures  . Basic metabolic panel    Signed, [name] MD 02/20/2015 1:05 PM    [facility] [address] [place], [place], [place]  [address] Phone: [phone]; Fax: [phone]

## 2015-02-20 NOTE — Patient Instructions (Addendum)
Medication Instructions:  Your physician recommends that you continue on your current medications as directed. Please refer to the Current Medication list given to you today.  Labwork: Lp/bmet/hfp  Testing/Procedures: none  Follow-Up: Your physician wants you to follow-up in: 4 month ov/ekg/bmet  You will receive a reminder letter in the mail two months in advance. If you don't receive a letter, please call our office to schedule the follow-up appointment.  Work harder on diet and weight loss

## 2015-02-20 NOTE — Addendum Note (Signed)
Addended by: Tonita Phoenix on: 02/20/2015 08:43 AM   Modules accepted: Orders

## 2015-02-21 ENCOUNTER — Telehealth: Payer: Self-pay | Admitting: Cardiology

## 2015-02-21 NOTE — Telephone Encounter (Signed)
-----   Message from [name], MD sent at 02/20/2015  6:51 PM EDT ----- Please report to patient.  The recent labs are stable. Continue same medication and careful diet.

## 2015-02-21 NOTE — Telephone Encounter (Signed)
Follow Up ° °Pt returned call//  °

## 2015-02-21 NOTE — Telephone Encounter (Signed)
Left message to call back  

## 2015-02-27 NOTE — Telephone Encounter (Signed)
Advised daughter of labs 

## 2015-04-03 ENCOUNTER — Ambulatory Visit (INDEPENDENT_AMBULATORY_CARE_PROVIDER_SITE_OTHER): Payer: Medicare Other | Admitting: *Deleted

## 2015-04-03 DIAGNOSIS — I352 Nonrheumatic aortic (valve) stenosis with insufficiency: Secondary | ICD-10-CM | POA: Diagnosis not present

## 2015-04-03 DIAGNOSIS — Z954 Presence of other heart-valve replacement: Secondary | ICD-10-CM | POA: Diagnosis not present

## 2015-04-03 DIAGNOSIS — I4891 Unspecified atrial fibrillation: Secondary | ICD-10-CM

## 2015-04-03 DIAGNOSIS — Z952 Presence of prosthetic heart valve: Secondary | ICD-10-CM

## 2015-04-03 DIAGNOSIS — Z5181 Encounter for therapeutic drug level monitoring: Secondary | ICD-10-CM

## 2015-04-03 LAB — POCT INR: INR: 2.7

## 2015-04-24 ENCOUNTER — Encounter: Payer: Self-pay | Admitting: *Deleted

## 2015-05-01 ENCOUNTER — Encounter: Payer: Self-pay | Admitting: Family

## 2015-05-01 ENCOUNTER — Ambulatory Visit (INDEPENDENT_AMBULATORY_CARE_PROVIDER_SITE_OTHER): Payer: Medicare Other | Admitting: Family

## 2015-05-01 VITALS — BP 140/100 | HR 88 | Temp 98.1°F | Resp 18 | Ht 62.0 in | Wt 212.0 lb

## 2015-05-01 DIAGNOSIS — W19XXXA Unspecified fall, initial encounter: Secondary | ICD-10-CM

## 2015-05-01 DIAGNOSIS — R2681 Unsteadiness on feet: Secondary | ICD-10-CM | POA: Insufficient documentation

## 2015-05-01 DIAGNOSIS — S40029A Contusion of unspecified upper arm, initial encounter: Secondary | ICD-10-CM

## 2015-05-01 NOTE — Assessment & Plan Note (Addendum)
74 yr old with gait instability and falls.  Will arrange home health PT.  Also advised pt to use walker to help prevent recurrent falls.

## 2015-05-01 NOTE — Patient Instructions (Addendum)
Use your walker to help prevent fall. Stand very slowly. You will be contacted about home health physical therapy.  Follow up in 6 weeks.

## 2015-05-01 NOTE — Progress Notes (Signed)
Subjective:    Patient ID: [name], female    DOB: 10/12/1940, 74 y.o.   MRN: [id number]  HPI  [name] is a 74 yr old female who presents today following a fall. Report Sunday before last she went ot [facility] and was walking toward the dock she fell without warning.  Reports 2 other falls since christmas One she fell over the tub and could not stand up.  She forgot to use her life alert bracelet.  Another time she got out of bed to go to the bathroom and fell across the threshold to her bedroom.  This occurred in February or march. She saw Cardiology (EP) following this event.   She has walkers but not use them.    She is now on home oxygen.   Review of Systems    see HPI  Past Medical History  Diagnosis Date  . History of ventricular tachycardia   . Peripheral edema   . Aortic insufficiency     s/p tissue AVR 2013  . History of gout   . Exogenous obesity   . History of CHF (congestive heart failure)   . 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 Hypertension   . 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 DOE (dyspnea on exertion)   . Stroke 2000  . Renal insufficiency   . Anxiety   . 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 Anemia   . Presence of IVC filter 2000  . Osteopenia 09/01/2013  . Coronary artery disease 2013    non obstructive  . Myocardial infarction 2000  . DVT (deep venous thrombosis)     History   Social History  . Marital Status: Divorced    Spouse Name: N/A  . Number of Children: N/A  . Years of Education: N/A   Occupational History  . Not on file.   Social History Main Topics  . Smoking status: Never Smoker   . Smokeless tobacco: Never Used  . Alcohol Use: No  . Drug Use: No  . Sexual Activity: No   Other Topics Concern  . Not on file   Social History Narrative   Lives with daughter.      Past Surgical History    Procedure Laterality Date  . Cardiac defibrillator placement  2013    MDT ICD  . Cataract extraction    . Embolectomy  02/15/2012    Procedure: EMBOLECTOMY;  Surgeon: [name], MD;  Location: [facility] OR;  Service: Vascular;  Laterality: Left;  left popliteal embolectomy with drain placement  . Eye surgery  2012    removal of cataracts bilaterally  . Cardiac catheterization  7/13    non obstructive CAD  . Tonsillectomy    . Aortic valve replacement  04/12/2012    Procedure: AORTIC VALVE REPLACEMENT (AVR);  Surgeon: Delight Ovens, MD;  Location: [facility] OR;  Service: Open Heart Surgery;  Laterality: N/A;  with nitric oxide  . Thoracic aortic aneurysm repair  04/12/2012    Procedure: THORACIC ASCENDING ANEURYSM REPAIR (AAA);  Surgeon: Delight Ovens, MD;  Location: [facility] OR;  Service: Open Heart Surgery;  Laterality: N/A;  . Clot removal  5.2013    blood clot removed from the back of the left leg  . Stomach surgery      lap band placed and later removed  . Left heart catheterization  with coronary angiogram N/A 04/08/2012    Procedure: LEFT HEART CATHETERIZATION WITH CORONARY ANGIOGRAM;  Surgeon: Tonny Bollman, MD;  Location: [facility] CATH LAB;  Service: Cardiovascular;  Laterality: N/A;    Family History  Problem Relation Age of Onset  . Hypertension Father   . Cancer Father   . Coronary artery disease Father 38  . Cancer Son     pancreas, spine    Allergies  Allergen Reactions  . Adhesive [Tape] Itching, Rash and Other (See Comments)    Redness and skin breaks down  . Latex Rash  . Neomycin-Bacitracin Zn-Polymyx Rash    Current Outpatient Prescriptions on File Prior to Visit  Medication Sig Dispense Refill  . acetaminophen (TYLENOL) 500 MG tablet Take 500 mg by mouth every 6 (six) hours as needed for moderate pain.    [name] allopurinol (ZYLOPRIM) 100 MG tablet TAKE 1 TABLET (100 MG TOTAL) BY MOUTH 2 (TWO) TIMES DAILY. 60 tablet 11  . amLODipine (NORVASC) 10 MG tablet Take 1  tablet (10 mg total) by mouth daily. 30 tablet 11  . Biotin 5000 MCG CAPS Take 1 capsule (5,000 mcg total) by mouth daily. 30 capsule 6  . bisoprolol (ZEBETA) 10 MG tablet TAKE 1 TABLET BY MOUTH TWICE A DAY 60 tablet 11  . Calcium Carb-Cholecalciferol (CALCIUM-VITAMIN D) 600-400 MG-UNIT TABS Take 1 tablet by mouth daily.    . CRESTOR 5 MG tablet TAKE 1 TABLET BY MOUTH EVERY DAY 30 tablet 11  . CVS ASPIRIN 81 MG EC tablet TAKE 1 TABLET (81 MG TOTAL) BY MOUTH DAILY. 90 tablet 0  . furosemide (LASIX) 40 MG tablet TAKE 1 TABLET TWICE A DAY 60 tablet 11  . KLOR-CON M20 20 MEQ tablet TAKE 1 TABLET BY MOUTH 3 TIMES A DAY 90 tablet 6  . lisinopril (PRINIVIL,ZESTRIL) 10 MG tablet Take 1 tablet (10 mg total) by mouth daily. 30 tablet 2  . senna-docusate (SENOKOT S) 8.6-50 MG per tablet Take 2 tablets by mouth at bedtime. For constipation, also available OTC 60 tablet 3  . warfarin (COUMADIN) 5 MG tablet Take as directed by Coumadin clinic 30 tablet 3   No current facility-administered medications on file prior to visit.    BP 140/100 mmHg  Pulse 88  Temp(Src) 98.1 F (36.7 C) (Oral)  Resp 18  Ht 5\' 2"  (1.575 m)  Wt 212 lb (96.163 kg)  BMI 38.77 kg/m2  SpO2 98%    Objective:   Physical Exam  Constitutional: She is oriented to person, place, and time. She appears well-developed and well-nourished. No distress.  Cardiovascular: Normal rate and regular rhythm.   No murmur heard. Pulmonary/Chest: Effort normal and breath sounds normal. No respiratory distress. She has no wheezes. She has no rales. She exhibits no tenderness.  Musculoskeletal:  2+ bilateral LE edema  Neurological: She is alert and oriented to person, place, and time.  Bilateral UE strength and LE strength is 5/5  Skin:  + bruising noted bilateral forearms.           Assessment & Plan:  15 min spent with pt today.  >50% of this time was spent counseling pt on importance of falls prevention and PT/strength exercises.

## 2015-05-01 NOTE — Progress Notes (Signed)
Pre visit review using our clinic review tool, if applicable. No additional management support is needed unless otherwise documented below in the visit note. 

## 2015-05-03 ENCOUNTER — Ambulatory Visit (INDEPENDENT_AMBULATORY_CARE_PROVIDER_SITE_OTHER): Payer: Medicare Other | Admitting: *Deleted

## 2015-05-03 DIAGNOSIS — I469 Cardiac arrest, cause unspecified: Secondary | ICD-10-CM | POA: Diagnosis not present

## 2015-05-03 DIAGNOSIS — I429 Cardiomyopathy, unspecified: Secondary | ICD-10-CM

## 2015-05-03 DIAGNOSIS — I428 Other cardiomyopathies: Secondary | ICD-10-CM

## 2015-05-03 LAB — CUP PACEART INCLINIC DEVICE CHECK
Battery Voltage: 2.89 V
Brady Statistic RV Percent Paced: 26.3 %
HIGH POWER IMPEDANCE MEASURED VALUE: 55 Ohm
HighPow Impedance: 44 Ohm
Lead Channel Impedance Value: 760 Ohm
Lead Channel Pacing Threshold Pulse Width: 0.8 ms
Lead Channel Setting Pacing Amplitude: 2.5 V
Lead Channel Setting Pacing Pulse Width: 0.8 ms
MDC IDC MSMT LEADCHNL RV PACING THRESHOLD AMPLITUDE: 1 V
MDC IDC MSMT LEADCHNL RV SENSING INTR AMPL: 10.6496
MDC IDC SESS DTM: 20160804151002
MDC IDC SET LEADCHNL RV SENSING SENSITIVITY: 0.3 mV
MDC IDC SET ZONE DETECTION INTERVAL: 330 ms
MDC IDC SET ZONE DETECTION INTERVAL: 450 ms
Zone Setting Detection Interval: 290 ms

## 2015-05-03 NOTE — Progress Notes (Signed)
ICD check in clinic. Normal device function. Threshold and sensing consistent with previous device measurements. Impedance trends stable over time. No evidence of any ventricular arrhythmias. Histogram distribution appropriate for patient and level of activity. No changes made this session. Device programmed at appropriate safety margins. Device programmed to optimize intrinsic conduction. Estimated longevity 2.89V. Pt enrolled in remote follow-up. ROV with device clinic in November and with SK in April.

## 2015-05-09 ENCOUNTER — Telehealth: Payer: Self-pay | Admitting: Family

## 2015-05-09 NOTE — Telephone Encounter (Signed)
Left message for [name] on cell number that Efraim Kaufmann states to continue home health and if she had any questions to please call back.

## 2015-05-09 NOTE — Telephone Encounter (Signed)
Yes, please continue home health.

## 2015-05-09 NOTE — Telephone Encounter (Signed)
Caller name:[name] Relation to pt: Call back number:[phone] Pharmacy:  Reason for call: pt has already been seen and evaluated for home health physical therapy on 8/8 and planned to be seen 2x a week for 5 weeks, to work on strength, balance, and gait training, need verbal order is needed

## 2015-05-15 ENCOUNTER — Ambulatory Visit (INDEPENDENT_AMBULATORY_CARE_PROVIDER_SITE_OTHER): Payer: Medicare Other | Admitting: *Deleted

## 2015-05-15 DIAGNOSIS — Z952 Presence of prosthetic heart valve: Secondary | ICD-10-CM

## 2015-05-15 DIAGNOSIS — Z5181 Encounter for therapeutic drug level monitoring: Secondary | ICD-10-CM

## 2015-05-15 DIAGNOSIS — I4891 Unspecified atrial fibrillation: Secondary | ICD-10-CM | POA: Diagnosis not present

## 2015-05-15 DIAGNOSIS — Z954 Presence of other heart-valve replacement: Secondary | ICD-10-CM | POA: Diagnosis not present

## 2015-05-15 DIAGNOSIS — I352 Nonrheumatic aortic (valve) stenosis with insufficiency: Secondary | ICD-10-CM

## 2015-05-15 LAB — POCT INR: INR: 2.2

## 2015-05-23 ENCOUNTER — Telehealth: Payer: Self-pay | Admitting: *Deleted

## 2015-05-23 NOTE — Telephone Encounter (Signed)
Received home health certification and plan of care via fax from [place] for certification period: 05/07/15 - 07/05/15. Forwarded to [employer]. JG//CMA

## 2015-06-02 ENCOUNTER — Other Ambulatory Visit: Payer: Self-pay | Admitting: Cardiology

## 2015-06-05 NOTE — Telephone Encounter (Signed)
Plan of care faxed to [place] at [phone], sent to scanning.

## 2015-06-08 ENCOUNTER — Telehealth: Payer: Self-pay | Admitting: Cardiology

## 2015-06-08 NOTE — Telephone Encounter (Signed)
New message     Last Sunday hit arm and it started bleeding Arm started bleeding again today Pt has questions regarding medications and if changes need to be made to help with the bleeding

## 2015-06-08 NOTE — Telephone Encounter (Signed)
Spoke with patient and her arm has been bleeding off and on since Sunday, same spot Discussed with  Dr. Patty Sermons and will have patient hold coumadin for 2 days and then resume at usual dose Advised patient, verbalized understanding

## 2015-06-12 ENCOUNTER — Ambulatory Visit (INDEPENDENT_AMBULATORY_CARE_PROVIDER_SITE_OTHER): Payer: Medicare Other | Admitting: Family

## 2015-06-12 ENCOUNTER — Encounter: Payer: Self-pay | Admitting: Family

## 2015-06-12 VITALS — BP 142/97 | HR 54 | Temp 97.7°F | Resp 16 | Ht 62.0 in | Wt 207.0 lb

## 2015-06-12 DIAGNOSIS — M109 Gout, unspecified: Secondary | ICD-10-CM | POA: Diagnosis not present

## 2015-06-12 DIAGNOSIS — I10 Essential (primary) hypertension: Secondary | ICD-10-CM | POA: Diagnosis not present

## 2015-06-12 DIAGNOSIS — R2681 Unsteadiness on feet: Secondary | ICD-10-CM

## 2015-06-12 DIAGNOSIS — I5043 Acute on chronic combined systolic (congestive) and diastolic (congestive) heart failure: Secondary | ICD-10-CM | POA: Diagnosis not present

## 2015-06-12 MED ORDER — LISINOPRIL 20 MG PO TABS: 20.0000 mg | ORAL_TABLET | Freq: Every day | ORAL | Status: DC

## 2015-06-12 NOTE — Patient Instructions (Addendum)
Stop lisinopril 10mg  start lisinopril 20mg .  Look into joining the [employer] and using "silver sneakers" through your insurance.   Follow up in 2 weeks for nurse visit blood pressure check and lab work (BMET- dx HTN)

## 2015-06-12 NOTE — Progress Notes (Signed)
Pre visit review using our clinic review tool, if applicable. No additional management support is needed unless otherwise documented below in the visit note. 

## 2015-06-12 NOTE — Progress Notes (Signed)
Subjective:    Patient ID: [name], female    DOB: November 25, 1940, 74 y.o.   MRN: [id number]  HPI  [name] is a 74 yr old female who presents today for follow up.   Reports recent bruising. Reports that she had intermittent bleeding from skin irritation left forearm.   Lab Results  Component Value Date   INR 2.2 05/15/2015   INR 2.7 04/03/2015   INR 2.5 02/20/2015   HTN- on lisinopril, lasix, bioprolol, amlodipine.  BP Readings from Last 3 Encounters:  06/12/15 142/97  05/01/15 140/100  02/20/15 124/60   CHF- she is maintained on oxygen.  Reports that overall she is breathing better.  Wt Readings from Last 3 Encounters:  06/12/15 207 lb (93.895 kg)  05/01/15 212 lb (96.163 kg)  02/20/15 211 lb 12.8 oz (96.072 kg)   Gout- denies recent flares.    Review of Systems Past Medical History  Diagnosis Date  . History of ventricular tachycardia   . Peripheral edema   . Aortic insufficiency     s/p tissue AVR 2013  . History of gout   . Exogenous obesity   . History of CHF (congestive heart failure)   . 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 Hypertension   . 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 DOE (dyspnea on exertion)   . Stroke 2000  . Renal insufficiency   . Anxiety   . 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 Anemia   . Presence of IVC filter 2000  . Osteopenia 09/01/2013  . Coronary artery disease 2013    non obstructive  . Myocardial infarction 2000  . DVT (deep venous thrombosis)     Social History   Social History  . Marital Status: Divorced    Spouse Name: N/A  . Number of Children: N/A  . Years of Education: N/A   Occupational History  . Not on file.   Social History Main Topics  . Smoking status: Never Smoker   . Smokeless tobacco: Never Used  . Alcohol Use: No  . Drug Use: No  . Sexual Activity: No   Other Topics Concern  .  Not on file   Social History Narrative   Lives with daughter.      Past Surgical History  Procedure Laterality Date  . Cardiac defibrillator placement  2013    MDT ICD  . Cataract extraction    . Embolectomy  02/15/2012    Procedure: EMBOLECTOMY;  Surgeon: [name], MD;  Location: [facility] OR;  Service: Vascular;  Laterality: Left;  left popliteal embolectomy with drain placement  . Eye surgery  2012    removal of cataracts bilaterally  . Cardiac catheterization  7/13    non obstructive CAD  . Tonsillectomy    . Aortic valve replacement  04/12/2012    Procedure: AORTIC VALVE REPLACEMENT (AVR);  Surgeon: Delight Ovens, MD;  Location: [facility] OR;  Service: Open Heart Surgery;  Laterality: N/A;  with nitric oxide  . Thoracic aortic aneurysm repair  04/12/2012    Procedure: THORACIC ASCENDING ANEURYSM REPAIR (AAA);  Surgeon: Delight Ovens, MD;  Location: [facility] OR;  Service: Open Heart Surgery;  Laterality: N/A;  . Clot removal  5.2013    blood clot removed from the back of the left leg  . Stomach surgery      lap band placed  and later removed  . Left heart catheterization with coronary angiogram N/A 04/08/2012    Procedure: LEFT HEART CATHETERIZATION WITH CORONARY ANGIOGRAM;  Surgeon: Tonny Bollman, MD;  Location: [facility] CATH LAB;  Service: Cardiovascular;  Laterality: N/A;    Family History  Problem Relation Age of Onset  . Hypertension Father   . Cancer Father   . Coronary artery disease Father 71  . Cancer Son     pancreas, spine    Allergies  Allergen Reactions  . Adhesive [Tape] Itching, Rash and Other (See Comments)    Redness and skin breaks down  . Latex Rash  . Neomycin-Bacitracin Zn-Polymyx Rash    Current Outpatient Prescriptions on File Prior to Visit  Medication Sig Dispense Refill  . acetaminophen (TYLENOL) 500 MG tablet Take 500 mg by mouth every 6 (six) hours as needed for moderate pain.    [name] allopurinol (ZYLOPRIM) 100 MG tablet TAKE 1 TABLET (100 MG  TOTAL) BY MOUTH 2 (TWO) TIMES DAILY. 60 tablet 11  . amLODipine (NORVASC) 10 MG tablet Take 1 tablet (10 mg total) by mouth daily. 30 tablet 11  . Biotin 5000 MCG CAPS Take 1 capsule (5,000 mcg total) by mouth daily. 30 capsule 6  . bisoprolol (ZEBETA) 10 MG tablet TAKE 1 TABLET BY MOUTH TWICE A DAY 60 tablet 11  . Calcium Carb-Cholecalciferol (CALCIUM-VITAMIN D) 600-400 MG-UNIT TABS Take 1 tablet by mouth daily.    . CRESTOR 5 MG tablet TAKE 1 TABLET BY MOUTH EVERY DAY 30 tablet 11  . CVS ASPIRIN 81 MG EC tablet TAKE 1 TABLET (81 MG TOTAL) BY MOUTH DAILY. 90 tablet 0  . furosemide (LASIX) 40 MG tablet TAKE 1 TABLET TWICE A DAY 60 tablet 11  . KLOR-CON M20 20 MEQ tablet TAKE 1 TABLET BY MOUTH 3 TIMES A DAY 90 tablet 6  . lisinopril (PRINIVIL,ZESTRIL) 10 MG tablet Take 1 tablet (10 mg total) by mouth daily. 30 tablet 2  . senna-docusate (SENOKOT S) 8.6-50 MG per tablet Take 2 tablets by mouth at bedtime. For constipation, also available OTC 60 tablet 3  . warfarin (COUMADIN) 5 MG tablet TAKE AS DIRECTED BY COUMADIN CLINIC 30 tablet 3   No current facility-administered medications on file prior to visit.    BP 142/97 mmHg  Pulse 54  Temp(Src) 97.7 F (36.5 C) (Oral)  Resp 16  Ht 5\' 2"  (1.575 m)  Wt 207 lb (93.895 kg)  BMI 37.85 kg/m2  SpO2 98%       Objective:   Physical Exam  Constitutional: She is oriented to person, place, and time. She appears well-developed and well-nourished.  HENT:  Head: Normocephalic and atraumatic.  Cardiovascular: Normal rate, regular rhythm and normal heart sounds.   No murmur heard. Pulmonary/Chest: Effort normal and breath sounds normal. No respiratory distress. She has no wheezes.  Musculoskeletal:  1+ bilateral LE edema  Neurological: She is alert and oriented to person, place, and time.  Skin: Skin is warm and dry.  Skin tear left forearm with dried blood.   Psychiatric: She has a normal mood and affect. Her behavior is normal. Judgment and  thought content normal.          Assessment & Plan:  Skin tear- cleaned and redressed today.

## 2015-06-14 NOTE — Assessment & Plan Note (Signed)
Clinically euvolemic. Management per cardiology.  

## 2015-06-14 NOTE — Assessment & Plan Note (Signed)
Uncontrolled.  Increase lisinopril from 10 mg to 20 mg.  

## 2015-06-14 NOTE — Assessment & Plan Note (Signed)
Pt completed PT.

## 2015-06-14 NOTE — Assessment & Plan Note (Signed)
Stable, continue allopurinol

## 2015-06-15 ENCOUNTER — Emergency Department (HOSPITAL_BASED_OUTPATIENT_CLINIC_OR_DEPARTMENT_OTHER): Payer: Medicare Other

## 2015-06-15 ENCOUNTER — Emergency Department (HOSPITAL_BASED_OUTPATIENT_CLINIC_OR_DEPARTMENT_OTHER)
Admission: EM | Admit: 2015-06-15 | Discharge: 2015-06-15 | Disposition: A | Discharge status: Home / Self Care | Payer: Medicare Other | Attending: Emergency Medicine | Admitting: Emergency Medicine

## 2015-06-15 ENCOUNTER — Encounter (HOSPITAL_BASED_OUTPATIENT_CLINIC_OR_DEPARTMENT_OTHER): Payer: Self-pay | Admitting: Emergency Medicine

## 2015-06-15 DIAGNOSIS — Z7901 Long term (current) use of anticoagulants: Secondary | ICD-10-CM | POA: Diagnosis not present

## 2015-06-15 DIAGNOSIS — I2510 Atherosclerotic heart disease of native coronary artery without angina pectoris: Secondary | ICD-10-CM | POA: Insufficient documentation

## 2015-06-15 DIAGNOSIS — Z862 Personal history of diseases of the blood and blood-forming organs and certain disorders involving the immune mechanism: Secondary | ICD-10-CM | POA: Insufficient documentation

## 2015-06-15 DIAGNOSIS — M8580 Other specified disorders of bone density and structure, unspecified site: Secondary | ICD-10-CM | POA: Insufficient documentation

## 2015-06-15 DIAGNOSIS — I509 Heart failure, unspecified: Secondary | ICD-10-CM | POA: Diagnosis not present

## 2015-06-15 DIAGNOSIS — I10 Essential (primary) hypertension: Secondary | ICD-10-CM | POA: Diagnosis not present

## 2015-06-15 DIAGNOSIS — I252 Old myocardial infarction: Secondary | ICD-10-CM | POA: Insufficient documentation

## 2015-06-15 DIAGNOSIS — Z86718 Personal history of other venous thrombosis and embolism: Secondary | ICD-10-CM | POA: Diagnosis not present

## 2015-06-15 DIAGNOSIS — I4891 Unspecified atrial fibrillation: Secondary | ICD-10-CM | POA: Insufficient documentation

## 2015-06-15 DIAGNOSIS — M109 Gout, unspecified: Secondary | ICD-10-CM | POA: Insufficient documentation

## 2015-06-15 DIAGNOSIS — Z95810 Presence of automatic (implantable) cardiac defibrillator: Secondary | ICD-10-CM | POA: Diagnosis not present

## 2015-06-15 DIAGNOSIS — N390 Urinary tract infection, site not specified: Secondary | ICD-10-CM | POA: Diagnosis not present

## 2015-06-15 DIAGNOSIS — Z8673 Personal history of transient ischemic attack (TIA), and cerebral infarction without residual deficits: Secondary | ICD-10-CM | POA: Diagnosis not present

## 2015-06-15 DIAGNOSIS — E669 Obesity, unspecified: Secondary | ICD-10-CM | POA: Diagnosis not present

## 2015-06-15 DIAGNOSIS — Z79899 Other long term (current) drug therapy: Secondary | ICD-10-CM | POA: Diagnosis not present

## 2015-06-15 DIAGNOSIS — R197 Diarrhea, unspecified: Secondary | ICD-10-CM | POA: Diagnosis not present

## 2015-06-15 DIAGNOSIS — Z91040 Latex allergy status: Secondary | ICD-10-CM | POA: Diagnosis not present

## 2015-06-15 DIAGNOSIS — Z8659 Personal history of other mental and behavioral disorders: Secondary | ICD-10-CM | POA: Diagnosis not present

## 2015-06-15 DIAGNOSIS — R011 Cardiac murmur, unspecified: Secondary | ICD-10-CM | POA: Diagnosis not present

## 2015-06-15 DIAGNOSIS — Z9889 Other specified postprocedural states: Secondary | ICD-10-CM | POA: Diagnosis not present

## 2015-06-15 DIAGNOSIS — R109 Unspecified abdominal pain: Secondary | ICD-10-CM

## 2015-06-15 LAB — CBC WITH DIFFERENTIAL/PLATELET
BASOS ABS: 0 10*3/uL (ref 0.0–0.1)
BASOS PCT: 0 %
EOS ABS: 0 10*3/uL (ref 0.0–0.7)
EOS PCT: 0 %
HEMATOCRIT: 38 % (ref 36.0–46.0)
Hemoglobin: 12.4 g/dL (ref 12.0–15.0)
Lymphocytes Relative: 3 %
Lymphs Abs: 0.6 10*3/uL — ABNORMAL LOW (ref 0.7–4.0)
MCH: 31.4 pg (ref 26.0–34.0)
MCHC: 32.6 g/dL (ref 30.0–36.0)
MCV: 96.2 fL (ref 78.0–100.0)
MONO ABS: 1.6 10*3/uL — AB (ref 0.1–1.0)
Monocytes Relative: 10 %
NEUTROS ABS: 14.3 10*3/uL — AB (ref 1.7–7.7)
Neutrophils Relative %: 87 %
PLATELETS: 159 10*3/uL (ref 150–400)
RBC: 3.95 MIL/uL (ref 3.87–5.11)
RDW: 14.2 % (ref 11.5–15.5)
WBC: 16.5 10*3/uL — ABNORMAL HIGH (ref 4.0–10.5)

## 2015-06-15 LAB — URINALYSIS, ROUTINE W REFLEX MICROSCOPIC
Bilirubin Urine: NEGATIVE
GLUCOSE, UA: NEGATIVE mg/dL
KETONES UR: NEGATIVE mg/dL
NITRITE: NEGATIVE
PH: 5 (ref 5.0–8.0)
PROTEIN: NEGATIVE mg/dL
Specific Gravity, Urine: 1.007 (ref 1.005–1.030)
Urobilinogen, UA: 0.2 mg/dL (ref 0.0–1.0)

## 2015-06-15 LAB — COMPREHENSIVE METABOLIC PANEL
ALBUMIN: 3.6 g/dL (ref 3.5–5.0)
ALT: 13 U/L — ABNORMAL LOW (ref 14–54)
ANION GAP: 11 (ref 5–15)
AST: 29 U/L (ref 15–41)
Alkaline Phosphatase: 73 U/L (ref 38–126)
BUN: 30 mg/dL — AB (ref 6–20)
CHLORIDE: 104 mmol/L (ref 101–111)
CO2: 26 mmol/L (ref 22–32)
Calcium: 8.9 mg/dL (ref 8.9–10.3)
Creatinine, Ser: 1.5 mg/dL — ABNORMAL HIGH (ref 0.44–1.00)
GFR calc Af Amer: 39 mL/min — ABNORMAL LOW (ref >60)
GFR calc non Af Amer: 33 mL/min — ABNORMAL LOW (ref >60)
GLUCOSE: 136 mg/dL — AB (ref 65–99)
POTASSIUM: 3.5 mmol/L (ref 3.5–5.1)
SODIUM: 141 mmol/L (ref 135–145)
Total Bilirubin: 0.8 mg/dL (ref 0.3–1.2)
Total Protein: 7.1 g/dL (ref 6.5–8.1)

## 2015-06-15 LAB — URINE MICROSCOPIC-ADD ON

## 2015-06-15 LAB — I-STAT CG4 LACTIC ACID, ED: Lactic Acid, Venous: 1.89 mmol/L (ref 0.5–2.0)

## 2015-06-15 MED ORDER — SODIUM CHLORIDE 0.9 % IV SOLN
1000.0000 mL | Freq: Once | INTRAVENOUS | Status: AC
  Administered 2015-06-15: 1000 mL via INTRAVENOUS

## 2015-06-15 MED ORDER — DEXTROSE 5 % IV SOLN
1.0000 g | Freq: Once | INTRAVENOUS | Status: AC
  Administered 2015-06-15: 1 g via INTRAVENOUS

## 2015-06-15 MED ORDER — ONDANSETRON HCL 4 MG PO TABS: 4.0000 mg | ORAL_TABLET | Freq: Three times a day (TID) | ORAL | Status: DC | PRN

## 2015-06-15 MED ORDER — CEPHALEXIN 500 MG PO CAPS: 500.0000 mg | ORAL_CAPSULE | Freq: Three times a day (TID) | ORAL | Status: DC

## 2015-06-15 MED ORDER — CEFTRIAXONE SODIUM 1 G IJ SOLR
INTRAMUSCULAR | Status: AC
  Filled 2015-06-15: qty 10

## 2015-06-15 NOTE — ED Notes (Signed)
Fever , diarrhea since Wednesday. EMS gave tylenol for fever of 101.3. Urine foul smelling.

## 2015-06-15 NOTE — ED Provider Notes (Signed)
CSN: [id number]     Arrival date & time 06/15/15  4098 History   First MD Initiated Contact with Patient 06/15/15 [phone]     Chief Complaint  Patient presents with  . Diarrhea     (Consider location/radiation/quality/duration/timing/severity/associated sxs/prior Treatment) Patient is a 74 y.o. female presenting with diarrhea.  Diarrhea Quality:  Watery Severity:  Moderate Onset quality:  Gradual Duration:  4 days Timing:  Constant Relieved by:  None tried Worsened by:  Nothing tried Ineffective treatments:  None tried Associated symptoms: fever   Associated symptoms: no abdominal pain, no recent cough, no headaches and no vomiting   Risk factors: no recent antibiotic use     Past Medical History  Diagnosis Date  . History of ventricular tachycardia   . Peripheral edema   . Aortic insufficiency     s/p tissue AVR 2013  . History of gout   . Exogenous obesity   . History of CHF (congestive heart failure)   . 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 Hypertension   . 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 DOE (dyspnea on exertion)   . Stroke 2000  . Renal insufficiency   . Anxiety   . 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 Anemia   . Presence of IVC filter 2000  . Osteopenia 09/01/2013  . Coronary artery disease 2013    non obstructive  . Myocardial infarction 2000  . DVT (deep venous thrombosis)    Past Surgical History  Procedure Laterality Date  . Cardiac defibrillator placement  2013    MDT ICD  . Cataract extraction    . Embolectomy  02/15/2012    Procedure: EMBOLECTOMY;  Surgeon: [name], MD;  Location: [facility] OR;  Service: Vascular;  Laterality: Left;  left popliteal embolectomy with drain placement  . Eye surgery  2012    removal of cataracts bilaterally  . Cardiac catheterization  7/13    non obstructive CAD  . Tonsillectomy    . Aortic  valve replacement  04/12/2012    Procedure: AORTIC VALVE REPLACEMENT (AVR);  Surgeon: Delight Ovens, MD;  Location: [facility] OR;  Service: Open Heart Surgery;  Laterality: N/A;  with nitric oxide  . Thoracic aortic aneurysm repair  04/12/2012    Procedure: THORACIC ASCENDING ANEURYSM REPAIR (AAA);  Surgeon: Delight Ovens, MD;  Location: [facility] OR;  Service: Open Heart Surgery;  Laterality: N/A;  . Clot removal  5.2013    blood clot removed from the back of the left leg  . Stomach surgery      lap band placed and later removed  . Left heart catheterization with coronary angiogram N/A 04/08/2012    Procedure: LEFT HEART CATHETERIZATION WITH CORONARY ANGIOGRAM;  Surgeon: Tonny Bollman, MD;  Location: [facility] CATH LAB;  Service: Cardiovascular;  Laterality: N/A;   Family History  Problem Relation Age of Onset  . Hypertension Father   . Cancer Father   . Coronary artery disease Father 78  . Cancer Son     pancreas, spine   Social History  Substance Use Topics  . Smoking status: Never Smoker   . Smokeless tobacco: Never Used  . Alcohol Use: No   OB History    No data available     Review of Systems  Constitutional: Positive for fever. Negative for activity change.  HENT: Negative for congestion and facial swelling.  Eyes: Negative for discharge and redness.  Respiratory: Negative for cough and shortness of breath.   Cardiovascular: Negative for chest pain and palpitations.  Gastrointestinal: Positive for diarrhea. Negative for vomiting, abdominal pain and abdominal distention.  Endocrine: Negative for polydipsia and polyuria.  Genitourinary: Negative for dysuria and menstrual problem.  Musculoskeletal: Negative for back pain and joint swelling.  Skin: Negative for color change and wound.  Neurological: Negative for dizziness, light-headedness and headaches.  All other systems reviewed and are negative.     Allergies  Adhesive; Latex; and Neomycin-bacitracin zn-polymyx  Home  Medications   Prior to Admission medications   Medication Sig Start Date End Date Taking? Authorizing Provider  acetaminophen (TYLENOL) 500 MG tablet Take 500 mg by mouth every 6 (six) hours as needed for moderate pain.    Historical Provider, MD  allopurinol (ZYLOPRIM) 100 MG tablet TAKE 1 TABLET (100 MG TOTAL) BY MOUTH 2 (TWO) TIMES DAILY. 02/02/15   Duke Salvia, MD  amLODipine (NORVASC) 10 MG tablet Take 1 tablet (10 mg total) by mouth daily. 01/23/15   Duke Salvia, MD  Biotin 5000 MCG CAPS Take 1 capsule (5,000 mcg total) by mouth daily. 09/02/12   Edwyna Perfect, MD  bisoprolol (ZEBETA) 10 MG tablet TAKE 1 TABLET BY MOUTH TWICE A DAY 02/02/15   Duke Salvia, MD  Calcium Carb-Cholecalciferol (CALCIUM-VITAMIN D) 600-400 MG-UNIT TABS Take 1 tablet by mouth daily.    Historical Provider, MD  cephALEXin (KEFLEX) 500 MG capsule Take 1 capsule (500 mg total) by mouth 3 (three) times daily. 06/15/15   Marily Memos, MD  CRESTOR 5 MG tablet TAKE 1 TABLET BY MOUTH EVERY DAY 02/02/15   Duke Salvia, MD  [employer] ASPIRIN 81 MG EC tablet TAKE 1 TABLET (81 MG TOTAL) BY MOUTH DAILY. 10/27/14   [name], MD  furosemide (LASIX) 40 MG tablet TAKE 1 TABLET TWICE A DAY 02/02/15   Duke Salvia, MD  KLOR-CON M20 20 MEQ tablet TAKE 1 TABLET BY MOUTH 3 TIMES A DAY 02/02/15   [name], MD  lisinopril (PRINIVIL,ZESTRIL) 20 MG tablet Take 1 tablet (20 mg total) by mouth daily. 06/12/15   Sandford Craze, NP  ondansetron (ZOFRAN) 4 MG tablet Take 1 tablet (4 mg total) by mouth every 8 (eight) hours as needed for nausea or vomiting. 06/15/15   Marily Memos, MD  senna-docusate (SENOKOT S) 8.6-50 MG per tablet Take 2 tablets by mouth at bedtime. For constipation, also available OTC 11/11/13   Ripudeep Jenna Luo, MD  warfarin (COUMADIN) 5 MG tablet TAKE AS DIRECTED BY COUMADIN CLINIC 06/05/15   [name], MD   BP 102/54 mmHg  Pulse 57  Temp(Src) 101.9 F (38.8 C) (Rectal)  Resp 20  Ht 5\' 2"  (1.575 m)  Wt  207 lb (93.895 kg)  BMI 37.85 kg/m2  SpO2 100% Physical Exam  Constitutional: She is oriented to person, place, and time. She appears well-developed and well-nourished.  HENT:  Head: Normocephalic and atraumatic.  Eyes: Conjunctivae and EOM are normal. Right eye exhibits no discharge. Left eye exhibits no discharge.  Cardiovascular: Normal rate and regular rhythm.   Pulmonary/Chest: Effort normal and breath sounds normal. No respiratory distress.  Abdominal: Soft. She exhibits no distension. There is tenderness. There is no rebound.  Musculoskeletal: Normal range of motion. She exhibits no edema or tenderness.  Neurological: She is alert and oriented to person, place, and time.  Skin: Skin is warm and dry.  Nursing note and  vitals reviewed.   ED Course  Procedures (including critical care time) Labs Review Labs Reviewed  CBC WITH DIFFERENTIAL/PLATELET - Abnormal; Notable for the following:    WBC 16.5 (*)    Neutro Abs 14.3 (*)    Lymphs Abs 0.6 (*)    Monocytes Absolute 1.6 (*)    All other components within normal limits  COMPREHENSIVE METABOLIC PANEL - Abnormal; Notable for the following:    Glucose, Bld 136 (*)    BUN 30 (*)    Creatinine, Ser 1.50 (*)    ALT 13 (*)    GFR calc non Af Amer 33 (*)    GFR calc Af Amer 39 (*)    All other components within normal limits  URINALYSIS, ROUTINE W REFLEX MICROSCOPIC (NOT AT [facility]) - Abnormal; Notable for the following:    Hgb urine dipstick MODERATE (*)    Leukocytes, UA SMALL (*)    All other components within normal limits  URINE MICROSCOPIC-ADD ON - Abnormal; Notable for the following:    Bacteria, UA MANY (*)    All other components within normal limits  URINE CULTURE  I-STAT CG4 LACTIC ACID, ED  I-STAT CG4 LACTIC ACID, ED    Imaging Review Ct Renal Stone Study  06/15/2015   CLINICAL DATA:  Fever, diarrhea, generalized abdominal pain.  EXAM: CT ABDOMEN AND PELVIS WITHOUT CONTRAST  TECHNIQUE: Multidetector CT imaging of  the abdomen and pelvis was performed following the standard protocol without IV contrast.  COMPARISON:  None.  FINDINGS: Severe multilevel degenerative disc disease is noted in the lumbar spine. Visualized lung bases are unremarkable.  Large solitary gallstone is noted in the fundus of the gallbladder. No focal abnormality is noted in the liver, spleen or pancreas on these unenhanced images. Adrenal glands appear normal. Large exophytic cyst arises from upper pole of right kidney. Moderate right hydroureteronephrosis is noted without obstructing calculus. Periureteral stranding is noted suggesting inflammation. Small nonobstructive left renal calculus is noted. Atherosclerosis of abdominal aorta is noted without aneurysm formation. IVC filter is noted in infrarenal position. There is no evidence of bowel obstruction. No abnormal fluid collection is noted. Calcified degenerating fibroid is noted in the uterus. Ovaries appear normal. Urinary bladder appears normal. No significant adenopathy is noted.  IMPRESSION: Large solitary gallstone is noted without surrounding gallbladder inflammation.  Severe multilevel degenerative disc disease is noted in the lumbar spine.  Small nonobstructive left renal calculus.  Atherosclerosis of abdominal aorta is noted without aneurysm formation.  Moderate right hydroureteronephrosis is noted without evidence of obstructing calculus. Periureteral stranding is noted suggesting inflammation.   Electronically Signed   By: Lupita Raider, M.D.   On: 06/15/2015 10:36   I have personally reviewed and evaluated these images and lab results as part of my medical decision-making.   EKG Interpretation None      MDM   Final diagnoses:  Abdominal pain  UTI (lower urinary tract infection)   Patient with a week of progressively worsening weakness. Now with diarrhea as well. Multiple times a day. Exam benign. UA with UTI. CT negative for acute pathology. Rocephin here. Tolerating PO.  Dc on kelfex and zofran.   I have personally and contemperaneously reviewed labs and imaging and used in my decision making as above.   A medical screening exam was performed and I feel the patient has had an appropriate workup for their chief complaint at this time and likelihood of emergent condition existing is low. They have been counseled on  decision, discharge, follow up and which symptoms necessitate immediate return to the emergency department. They or their family verbally stated understanding and agreement with plan and discharged in stable condition.      Marily Memos, MD 06/15/15 2001

## 2015-06-17 ENCOUNTER — Telehealth: Payer: Self-pay | Admitting: Family

## 2015-06-17 NOTE — Telephone Encounter (Signed)
Please contact pt and arrange ED follow up with me 9/19 or 9/20.

## 2015-06-18 LAB — URINE CULTURE

## 2015-06-19 ENCOUNTER — Encounter: Payer: Self-pay | Admitting: Internal Medicine

## 2015-06-20 ENCOUNTER — Telehealth (HOSPITAL_BASED_OUTPATIENT_CLINIC_OR_DEPARTMENT_OTHER): Payer: Self-pay | Admitting: Emergency Medicine

## 2015-06-20 NOTE — Telephone Encounter (Signed)
Post ED Visit - Positive Culture Follow-up  Culture report reviewed by antimicrobial stewardship pharmacist:  []  Celedonio Miyamoto, Pharm.D., BCPS []  Georgina Pillion, Pharm.D., BCPS []  [place], [address].D., BCPS, AAHIVP []  Estella Husk, Pharm.D., BCPS, AAHIVP [x]  [place], [address].D. []  Cassie Roseanne Reno, [place].D.  Positive urine culture Klebsiella Treated with cephalexin, organism sensitive to the same and no further patient follow-up is required at this time.  Berle Mull 06/20/2015, 9:01 AM

## 2015-06-21 NOTE — Telephone Encounter (Signed)
Left msg on Kay's # (daughter) primary contact # to schedule ER f/u appt

## 2015-06-22 ENCOUNTER — Ambulatory Visit (INDEPENDENT_AMBULATORY_CARE_PROVIDER_SITE_OTHER): Payer: Medicare Other | Admitting: Family

## 2015-06-22 ENCOUNTER — Encounter: Payer: Self-pay | Admitting: Family

## 2015-06-22 VITALS — BP 138/97 | HR 55 | Temp 97.8°F | Resp 20 | Ht 62.0 in | Wt 207.6 lb

## 2015-06-22 DIAGNOSIS — N3001 Acute cystitis with hematuria: Secondary | ICD-10-CM

## 2015-06-22 DIAGNOSIS — N289 Disorder of kidney and ureter, unspecified: Secondary | ICD-10-CM

## 2015-06-22 DIAGNOSIS — D72829 Elevated white blood cell count, unspecified: Secondary | ICD-10-CM | POA: Diagnosis not present

## 2015-06-22 LAB — CBC WITH DIFFERENTIAL/PLATELET
BASOS ABS: 0 10*3/uL (ref 0.0–0.1)
Basophils Relative: 0.6 % (ref 0.0–3.0)
Eosinophils Absolute: 0.3 10*3/uL (ref 0.0–0.7)
Eosinophils Relative: 4.5 % (ref 0.0–5.0)
HEMATOCRIT: 39.8 % (ref 36.0–46.0)
HEMOGLOBIN: 13.2 g/dL (ref 12.0–15.0)
LYMPHS PCT: 27.1 % (ref 12.0–46.0)
Lymphs Abs: 2 10*3/uL (ref 0.7–4.0)
MCHC: 33.1 g/dL (ref 30.0–36.0)
MCV: 93.3 fl (ref 78.0–100.0)
MONOS PCT: 8.9 % (ref 3.0–12.0)
Monocytes Absolute: 0.7 10*3/uL (ref 0.1–1.0)
NEUTROS PCT: 58.9 % (ref 43.0–77.0)
Neutro Abs: 4.4 10*3/uL (ref 1.4–7.7)
Platelets: 279 10*3/uL (ref 150.0–400.0)
RBC: 4.27 Mil/uL (ref 3.87–5.11)
RDW: 14.8 % (ref 11.5–15.5)
WBC: 7.5 10*3/uL (ref 4.0–10.5)

## 2015-06-22 LAB — POCT URINALYSIS DIPSTICK
Bilirubin, UA: NEGATIVE
Blood, UA: NEGATIVE
GLUCOSE UA: NEGATIVE
Ketones, UA: NEGATIVE
Leukocytes, UA: NEGATIVE
Nitrite, UA: NEGATIVE
Protein, UA: NEGATIVE
SPEC GRAV UA: 1.025
UROBILINOGEN UA: NEGATIVE
pH, UA: 5.5

## 2015-06-22 LAB — BASIC METABOLIC PANEL
BUN: 23 mg/dL (ref 6–23)
CHLORIDE: 106 meq/L (ref 96–112)
CO2: 28 meq/L (ref 19–32)
CREATININE: 1.27 mg/dL — AB (ref 0.40–1.20)
Calcium: 9.5 mg/dL (ref 8.4–10.5)
GFR: 43.79 mL/min — ABNORMAL LOW (ref >60.00)
GLUCOSE: 89 mg/dL (ref 70–99)
Potassium: 3.9 mEq/L (ref 3.5–5.1)
Sodium: 144 mEq/L (ref 135–145)

## 2015-06-22 NOTE — Progress Notes (Signed)
Subjective:    Patient ID: [name], female    DOB: [birth year], 74 y.o.   MRN: [id number]  HPI  [name] is a 74 yr old female who presents today for ED follow up. ED record is reviewed. The patient was seen 06/15/15 with chief complaint of diarrhea.   WBC was elevated at 16.5,  Cr 1.5, there was + microscopic hematuria.  It was felt that pt had UTI and she was given IM rocephin and discharged with PO keflex.  Urine culture grew klebsiella which was sensitive to cephalosporin. Now having 2 formed stools a day. Granddaughter noted that she was confused last week, mental status is returning to baseline. Denies dysuria, hematuria or urinary frequency.    Review of Systems See HPI  Past Medical History  Diagnosis Date  . History of ventricular tachycardia   . Peripheral edema   . Aortic insufficiency     s/p tissue AVR 2013  . History of gout   . Exogenous obesity   . History of CHF (congestive heart failure)   . 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 Hypertension   . 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 DOE (dyspnea on exertion)   . Stroke 2000  . Renal insufficiency   . Anxiety   . 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 Anemia   . Presence of IVC filter 2000  . Osteopenia 09/01/2013  . Coronary artery disease 2013    non obstructive  . Myocardial infarction 2000  . DVT (deep venous thrombosis)     Social History   Social History  . Marital Status: Divorced    Spouse Name: N/A  . Number of Children: N/A  . Years of Education: N/A   Occupational History  . Not on file.   Social History Main Topics  . Smoking status: Never Smoker   . Smokeless tobacco: Never Used  . Alcohol Use: No  . Drug Use: No  . Sexual Activity: No   Other Topics Concern  . Not on file   Social History Narrative   Lives with daughter.      Past Surgical History    Procedure Laterality Date  . Cardiac defibrillator placement  2013    MDT ICD  . Cataract extraction    . Embolectomy  02/15/2012    Procedure: EMBOLECTOMY;  Surgeon: [name], MD;  Location: [facility] OR;  Service: Vascular;  Laterality: Left;  left popliteal embolectomy with drain placement  . Eye surgery  2012    removal of cataracts bilaterally  . Cardiac catheterization  7/13    non obstructive CAD  . Tonsillectomy    . Aortic valve replacement  04/12/2012    Procedure: AORTIC VALVE REPLACEMENT (AVR);  Surgeon: Delight Ovens, MD;  Location: [facility] OR;  Service: Open Heart Surgery;  Laterality: N/A;  with nitric oxide  . Thoracic aortic aneurysm repair  04/12/2012    Procedure: THORACIC ASCENDING ANEURYSM REPAIR (AAA);  Surgeon: Delight Ovens, MD;  Location: [facility] OR;  Service: Open Heart Surgery;  Laterality: N/A;  . Clot removal  5.2013    blood clot removed from the back of the left leg  . Stomach surgery      lap band placed and later removed  . Left heart catheterization with coronary angiogram N/A 04/08/2012    Procedure: LEFT HEART CATHETERIZATION WITH  CORONARY ANGIOGRAM;  Surgeon: Tonny Bollman, MD;  Location: [facility] CATH LAB;  Service: Cardiovascular;  Laterality: N/A;    Family History  Problem Relation Age of Onset  . Hypertension Father   . Cancer Father   . Coronary artery disease Father 79  . Cancer Son     pancreas, spine    Allergies  Allergen Reactions  . Adhesive [Tape] Itching, Rash and Other (See Comments)    Redness and skin breaks down  . Latex Rash  . Neomycin-Bacitracin Zn-Polymyx Rash    Current Outpatient Prescriptions on File Prior to Visit  Medication Sig Dispense Refill  . acetaminophen (TYLENOL) 500 MG tablet Take 500 mg by mouth every 6 (six) hours as needed for moderate pain.    [name] allopurinol (ZYLOPRIM) 100 MG tablet TAKE 1 TABLET (100 MG TOTAL) BY MOUTH 2 (TWO) TIMES DAILY. 60 tablet 11  . amLODipine (NORVASC) 10 MG tablet Take 1  tablet (10 mg total) by mouth daily. 30 tablet 11  . Biotin 5000 MCG CAPS Take 1 capsule (5,000 mcg total) by mouth daily. 30 capsule 6  . bisoprolol (ZEBETA) 10 MG tablet TAKE 1 TABLET BY MOUTH TWICE A DAY 60 tablet 11  . Calcium Carb-Cholecalciferol (CALCIUM-VITAMIN D) 600-400 MG-UNIT TABS Take 1 tablet by mouth daily.    . CRESTOR 5 MG tablet TAKE 1 TABLET BY MOUTH EVERY DAY 30 tablet 11  . [employer] ASPIRIN 81 MG EC tablet TAKE 1 TABLET (81 MG TOTAL) BY MOUTH DAILY. 90 tablet 0  . furosemide (LASIX) 40 MG tablet TAKE 1 TABLET TWICE A DAY 60 tablet 11  . KLOR-CON M20 20 MEQ tablet TAKE 1 TABLET BY MOUTH 3 TIMES A DAY 90 tablet 6  . lisinopril (PRINIVIL,ZESTRIL) 20 MG tablet Take 1 tablet (20 mg total) by mouth daily. 30 tablet 3  . senna-docusate (SENOKOT S) 8.6-50 MG per tablet Take 2 tablets by mouth at bedtime. For constipation, also available OTC 60 tablet 3  . warfarin (COUMADIN) 5 MG tablet TAKE AS DIRECTED BY COUMADIN CLINIC 30 tablet 3   No current facility-administered medications on file prior to visit.    BP 138/97 mmHg  Pulse 55  Temp(Src) 97.8 F (36.6 C) (Oral)  Resp 20  Ht 5\' 2"  (1.575 m)  Wt 207 lb 9.6 oz (94.167 kg)  BMI 37.96 kg/m2       Objective:   Physical Exam  Constitutional: She is oriented to person, place, and time. She appears well-developed and well-nourished.  HENT:  Head: Normocephalic and atraumatic.  Cardiovascular: Normal rate, regular rhythm and normal heart sounds.   No murmur heard. Pulmonary/Chest: Effort normal and breath sounds normal. No respiratory distress. She has no wheezes.  Abdominal: Soft.  Very mild diffuse tenderness without guarding, normal BS  Musculoskeletal:  1+ bilateral LE edema  Lymphadenopathy:    She has no cervical adenopathy.  Neurological: She is alert and oriented to person, place, and time.  Psychiatric: She has a normal mood and affect. Her behavior is normal. Judgment and thought content normal.            Assessment & Plan:  UTI with hematuria- follow up UA today in office is negative.  Diarrhea is resolved.  Obtain follow up cbc to ensure that wbc has returned to normal.  Renal insufficiency- obtain follow up bmet.

## 2015-06-22 NOTE — Patient Instructions (Addendum)
Please complete lab work prior to leaving. Follow up in December as scheduled.

## 2015-06-22 NOTE — Addendum Note (Signed)
Addended by: Mervin Kung A on: 06/22/2015 10:37 AM   Modules accepted: Orders

## 2015-06-25 ENCOUNTER — Encounter: Payer: Self-pay | Admitting: Family

## 2015-06-25 NOTE — Telephone Encounter (Signed)
Pt was seen 9/23.

## 2015-06-26 ENCOUNTER — Ambulatory Visit (INDEPENDENT_AMBULATORY_CARE_PROVIDER_SITE_OTHER): Payer: Medicare Other

## 2015-06-26 DIAGNOSIS — I352 Nonrheumatic aortic (valve) stenosis with insufficiency: Secondary | ICD-10-CM

## 2015-06-26 DIAGNOSIS — Z954 Presence of other heart-valve replacement: Secondary | ICD-10-CM | POA: Diagnosis not present

## 2015-06-26 DIAGNOSIS — I4891 Unspecified atrial fibrillation: Secondary | ICD-10-CM | POA: Diagnosis not present

## 2015-06-26 DIAGNOSIS — Z5181 Encounter for therapeutic drug level monitoring: Secondary | ICD-10-CM | POA: Diagnosis not present

## 2015-06-26 DIAGNOSIS — Z952 Presence of prosthetic heart valve: Secondary | ICD-10-CM

## 2015-06-26 LAB — POCT INR: INR: 2.2

## 2015-06-27 ENCOUNTER — Ambulatory Visit: Payer: Medicare Other

## 2015-07-04 ENCOUNTER — Other Ambulatory Visit: Payer: Self-pay | Admitting: Physician Assistant

## 2015-07-04 ENCOUNTER — Other Ambulatory Visit: Payer: Self-pay | Admitting: Cardiology

## 2015-07-04 MED ORDER — AMLODIPINE BESYLATE 10 MG PO TABS: 10.0000 mg | ORAL_TABLET | Freq: Every day | ORAL | Status: DC

## 2015-07-12 ENCOUNTER — Ambulatory Visit (INDEPENDENT_AMBULATORY_CARE_PROVIDER_SITE_OTHER): Payer: Medicare Other | Admitting: Cardiology

## 2015-07-12 ENCOUNTER — Encounter: Payer: Self-pay | Admitting: Cardiology

## 2015-07-12 VITALS — BP 102/60 | HR 58 | Ht 62.0 in | Wt 206.8 lb

## 2015-07-12 DIAGNOSIS — Z954 Presence of other heart-valve replacement: Secondary | ICD-10-CM | POA: Diagnosis not present

## 2015-07-12 DIAGNOSIS — I10 Essential (primary) hypertension: Secondary | ICD-10-CM

## 2015-07-12 DIAGNOSIS — Z952 Presence of prosthetic heart valve: Secondary | ICD-10-CM

## 2015-07-12 MED ORDER — LISINOPRIL 10 MG PO TABS: 10.0000 mg | ORAL_TABLET | Freq: Every day | ORAL | Status: DC

## 2015-07-12 MED ORDER — ALLOPURINOL 100 MG PO TABS: ORAL_TABLET | ORAL | Status: DC

## 2015-07-12 MED ORDER — POTASSIUM CHLORIDE CRYS ER 20 MEQ PO TBCR: 20.0000 meq | EXTENDED_RELEASE_TABLET | Freq: Three times a day (TID) | ORAL | Status: DC

## 2015-07-12 NOTE — Progress Notes (Signed)
Cardiology Office Note   Date:  07/12/2015   ID:  [name], DOB 02/03/1941, MRN [id number]  PCP:  Lemont Fillers., NP  Cardiologist: [name] MD  No chief complaint on file.     History of Present Illness: [name] is a 74 y.o. female who presents for scheduled follow-up office visit.  This pleasant 74 year old woman is seen for a scheduled four-month office visit. 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 [name]. 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 She also has a dilated left ventricle with marked decrease in left ventricular systolic function. She is now on bisoprolol in place of carvedilol. She has a good appetite. She now lives with her [place] [facility] [place] [facility]. She remains in chronic atrial fibrillation and is now on Coumadin.  In February 2014 she was hospitalized with respiratory problems and exacerbation of CHF. Her last echocardiogram was on 11/05/13 and showed an ejection fraction of 45-50%. The bioprosthetic aortic valve was functioning normally. She was hospitalized from 9/21 until 06/23/14. She was hospitalized with acute on chronic diastolic heart failure. She underwent an echocardiogram on 06/20/14 which showed an ejection fraction of 50-55% and her prosthetic aortic valve appeared to be functioning normally. Her pulmonary artery pressure was 40. She was found to have hypoxemia with effort and is now on home oxygen and is followed by pulmonary. Normally she just uses the oxygen at night when she sleeps but not during the day Dr. Kendrick Fries is her pulmonologist. Since last visit she feels that her memory is worse. The patient has had problems with her balance.  She uses a walker.  She has had several controlled full.  Recently her lisinopril had been increased to 20 mg daily because of hypertension.  However since then  her blood pressures have been running low and she has been more dizzy and has had 3 falls.  Past Medical History  Diagnosis Date  . History of ventricular tachycardia   . Peripheral edema   . Aortic insufficiency     s/p tissue AVR 2013  . History of gout   . Exogenous obesity   . History of CHF (congestive heart failure)   . 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 Hypertension   . A-fib [facility])     Not on coumadin due to intracebral hemorrhage  . DOE (dyspnea on exertion)   . Stroke (HCC) 2000  . Renal insufficiency   . Anxiety   . 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 Anemia   . Presence of IVC filter 2000  . Osteopenia 09/01/2013  . Coronary artery disease 2013    non obstructive  . Myocardial infarction (HCC) 2000  . DVT (deep venous thrombosis) [facility])     Past Surgical History  Procedure Laterality Date  . Cardiac defibrillator placement  2013    MDT ICD  . Cataract extraction    . Embolectomy  02/15/2012    Procedure: EMBOLECTOMY;  Surgeon: [name], MD;  Location: [facility] OR;  Service: Vascular;  Laterality: Left;  left popliteal embolectomy with drain placement  . Eye surgery  2012    removal of cataracts bilaterally  . Cardiac catheterization  7/13    non obstructive CAD  . Tonsillectomy    . Aortic valve replacement  04/12/2012  Procedure: AORTIC VALVE REPLACEMENT (AVR);  Surgeon: Delight Ovens, MD;  Location: [facility] OR;  Service: Open Heart Surgery;  Laterality: N/A;  with nitric oxide  . Thoracic aortic aneurysm repair  04/12/2012    Procedure: THORACIC ASCENDING ANEURYSM REPAIR (AAA);  Surgeon: Delight Ovens, MD;  Location: [facility] OR;  Service: Open Heart Surgery;  Laterality: N/A;  . Clot removal  5.2013    blood clot removed from the back of the left leg  . Stomach surgery      lap band placed and later removed  . Left heart catheterization with coronary angiogram  N/A 04/08/2012    Procedure: LEFT HEART CATHETERIZATION WITH CORONARY ANGIOGRAM;  Surgeon: Tonny Bollman, MD;  Location: [facility] CATH LAB;  Service: Cardiovascular;  Laterality: N/A;     Current Outpatient Prescriptions  Medication Sig Dispense Refill  . acetaminophen (TYLENOL) 500 MG tablet Take 500 mg by mouth every 6 (six) hours as needed for moderate pain.    [name] allopurinol (ZYLOPRIM) 100 MG tablet TAKE 1 TABLET (100 MG TOTAL) BY MOUTH 2 (TWO) TIMES DAILY. 180 tablet 3  . amLODipine (NORVASC) 10 MG tablet Take 1 tablet (10 mg total) by mouth daily. 90 tablet 1  . Biotin 5000 MCG CAPS Take 1 capsule (5,000 mcg total) by mouth daily. 30 capsule 6  . bisoprolol (ZEBETA) 10 MG tablet TAKE 1 TABLET BY MOUTH TWICE A DAY 60 tablet 11  . Calcium Carb-Cholecalciferol (CALCIUM-VITAMIN D) 600-400 MG-UNIT TABS Take 1 tablet by mouth daily.    . CRESTOR 5 MG tablet TAKE 1 TABLET BY MOUTH EVERY DAY 30 tablet 11  . CVS ASPIRIN 81 MG EC tablet TAKE 1 TABLET (81 MG TOTAL) BY MOUTH DAILY. 90 tablet 0  . furosemide (LASIX) 40 MG tablet Take 40 mg by mouth 2 (two) times daily.    . potassium chloride SA (KLOR-CON M20) 20 MEQ tablet Take 1 tablet (20 mEq total) by mouth 3 (three) times daily. 270 tablet 3  . senna-docusate (SENOKOT S) 8.6-50 MG per tablet Take 2 tablets by mouth at bedtime. For constipation, also available OTC 60 tablet 3  . warfarin (COUMADIN) 5 MG tablet TAKE AS DIRECTED BY COUMADIN CLINIC 30 tablet 3  . lisinopril (PRINIVIL,ZESTRIL) 10 MG tablet Take 1 tablet (10 mg total) by mouth daily. 90 tablet 3   No current facility-administered medications for this visit.    Allergies:   Adhesive; Latex; and Neomycin-bacitracin zn-polymyx    Social History:  The patient  reports that she has never smoked. She has never used smokeless tobacco. She reports that she does not drink alcohol or use illicit drugs.   Family History:  The patient's family history includes Cancer in her father and son;  Coronary artery disease (age of onset: 70) in her father; Hypertension in her father.    ROS:  Please see the history of present illness.   Otherwise, review of systems are positive for none.   All other systems are reviewed and negative.    PHYSICAL EXAM: VS:  BP 102/60 mmHg  Pulse 58  Ht 5\' 2"  (1.575 m)  Wt 206 lb 12.8 oz (93.804 kg)  BMI 37.81 kg/m2 , BMI Body mass index is 37.81 kg/(m^2). GEN: Well nourished, well developed, in no acute distress HEENT: normal Neck: no JVD, carotid bruits, or masses Cardiac: Irregularly irregular pulse.  There is a grade 2/6 systolic ejection murmur across the aortic prosthetic valve.  There is no aortic insufficiency murmur. No rubs, or  gallops,no edema  Respiratory:  clear to auscultation bilaterally, normal work of breathing GI: soft, nontender, nondistended, + BS MS: no deformity or atrophy Skin: warm and dry, no rash Neuro:  Strength and sensation are intact Psych: euthymic mood, full affect   EKG:  EKG is ordered today. The ekg ordered today demonstrates atrial fibrillation with controlled ventricular response of 58 bpm.  Intermittent ventricular pacing is seen appropriately.   Recent Labs: 07/25/2014: Pro B Natriuretic peptide (BNP) 284.0* 01/23/2015: TSH 1.97 06/15/2015: ALT 13* 06/22/2015: BUN 23; Creatinine, Ser 1.27*; Hemoglobin 13.2; Platelets 279.0; Potassium 3.9; Sodium 144    Lipid Panel    Component Value Date/Time   CHOL 170 02/20/2015 0843   TRIG 104.0 02/20/2015 0843   HDL 70.50 02/20/2015 0843   CHOLHDL 2 02/20/2015 0843   VLDL 20.8 02/20/2015 0843   LDLCALC 79 02/20/2015 0843   LDLDIRECT 152.6 09/01/2011 1305      Wt Readings from Last 3 Encounters:  07/12/15 206 lb 12.8 oz (93.804 kg)  06/22/15 207 lb 9.6 oz (94.167 kg)  06/15/15 207 lb (93.895 kg)         ASSESSMENT AND PLAN:      1. permanent atrial fibrillation 2. ICD in place. Remote history of cardiac arrest secondary to ventricular  tachycardia. 3. status post replacement of native aortic valve and ascending root and placement of a bioprosthetic aortic valve, doing well postoperatively. 4. Essential hypertension.  Blood pressure is now low and she is having more dizzy spells 5. Hyperlipidemia 6. chronic kidney disease stage III 7. Chronic diastolic heart failure 8. Chronic hypoxic respiratory failure--she is on home oxygen at night. Dr. Kendrick Fries is her pulmonologist.  Current medicines are reviewed at length with the patient today.  The patient has concerns regarding medicines.  The following changes have been made:  We are reducing her lisinopril back to 10 mg daily  Labs/ tests ordered today include:   Orders Placed This Encounter  Procedures  . EKG 12-Lead   Continue current medication.  Recheck in 4 months for office visit Signed, [name] MD 07/12/2015 1:02 PM    [facility] [address] [place], [place], [place]  [address] Phone: [phone]; Fax: [phone]

## 2015-07-12 NOTE — Patient Instructions (Signed)
Medication Instructions:  DECREASE YOUR LISINOPRIL TO 10 MG DAILY  Labwork: NONE  Testing/Procedures: NOND  Follow-Up: Your physician recommends that you schedule a follow-up appointment in: 4 MONTH OV WITH Dawayne Patricia NP

## 2015-07-23 ENCOUNTER — Telehealth: Payer: Self-pay

## 2015-07-23 NOTE — Telephone Encounter (Signed)
Called to schedule Medicare Wellness Visit with Health Coach.  Left a message for call back.  

## 2015-07-23 NOTE — Telephone Encounter (Signed)
Johnson,[name] grandaughter will check with grandma schedule and call back to schedule her appointment

## 2015-07-24 NOTE — Telephone Encounter (Signed)
Noted. Thanks.

## 2015-08-08 ENCOUNTER — Ambulatory Visit (INDEPENDENT_AMBULATORY_CARE_PROVIDER_SITE_OTHER): Payer: Medicare Other | Admitting: Surgery

## 2015-08-08 ENCOUNTER — Ambulatory Visit (INDEPENDENT_AMBULATORY_CARE_PROVIDER_SITE_OTHER): Payer: Medicare Other | Admitting: *Deleted

## 2015-08-08 ENCOUNTER — Encounter: Payer: Self-pay | Admitting: Internal Medicine

## 2015-08-08 DIAGNOSIS — I428 Other cardiomyopathies: Secondary | ICD-10-CM

## 2015-08-08 DIAGNOSIS — Z954 Presence of other heart-valve replacement: Secondary | ICD-10-CM | POA: Diagnosis not present

## 2015-08-08 DIAGNOSIS — I429 Cardiomyopathy, unspecified: Secondary | ICD-10-CM | POA: Diagnosis not present

## 2015-08-08 DIAGNOSIS — I352 Nonrheumatic aortic (valve) stenosis with insufficiency: Secondary | ICD-10-CM | POA: Diagnosis not present

## 2015-08-08 DIAGNOSIS — I469 Cardiac arrest, cause unspecified: Secondary | ICD-10-CM | POA: Diagnosis not present

## 2015-08-08 DIAGNOSIS — Z952 Presence of prosthetic heart valve: Secondary | ICD-10-CM

## 2015-08-08 DIAGNOSIS — I4891 Unspecified atrial fibrillation: Secondary | ICD-10-CM | POA: Diagnosis not present

## 2015-08-08 DIAGNOSIS — I5022 Chronic systolic (congestive) heart failure: Secondary | ICD-10-CM | POA: Diagnosis not present

## 2015-08-08 DIAGNOSIS — Z5181 Encounter for therapeutic drug level monitoring: Secondary | ICD-10-CM | POA: Diagnosis not present

## 2015-08-08 DIAGNOSIS — Z95810 Presence of automatic (implantable) cardiac defibrillator: Secondary | ICD-10-CM | POA: Diagnosis not present

## 2015-08-08 LAB — POCT INR: INR: 2.6

## 2015-08-09 LAB — CUP PACEART INCLINIC DEVICE CHECK
Battery Voltage: 2.81 V
HIGH POWER IMPEDANCE MEASURED VALUE: 69 Ohm
HighPow Impedance: 46 Ohm
Implantable Lead Location: 753860
Lead Channel Impedance Value: 800 Ohm
Lead Channel Setting Pacing Amplitude: 2.5 V
Lead Channel Setting Sensing Sensitivity: 0.3 mV
MDC IDC LEAD IMPLANT DT: 20020822
MDC IDC LEAD MODEL: 6944
MDC IDC MSMT LEADCHNL RV PACING THRESHOLD AMPLITUDE: 1 V
MDC IDC MSMT LEADCHNL RV PACING THRESHOLD PULSEWIDTH: 0.8 ms
MDC IDC MSMT LEADCHNL RV SENSING INTR AMPL: 10.317 mV
MDC IDC SESS DTM: 20161109190515
MDC IDC SET LEADCHNL RV PACING PULSEWIDTH: 0.8 ms
MDC IDC STAT BRADY RV PERCENT PACED: 28.54 %

## 2015-08-09 NOTE — Progress Notes (Signed)
ICD check in clinic. Normal device function. Threshold and sensing consistent with previous device measurements. Impedance trends stable over time. No evidence of any ventricular arrhythmias. Histogram distribution appropriate for patient and level of activity. OptiVol stable. No changes made this session. Device programmed at appropriate safety margins. Device programmed to optimize intrinsic conduction. Remaining power 2.81V. Pt declined to be enrolled in remote follow-up. Alert tone demonstrated for patient but pt cannot hear alert tone. ROV w/ device clinic 11/14/15 & ROV w/ SK in 67mo.

## 2015-08-10 ENCOUNTER — Other Ambulatory Visit: Payer: Self-pay

## 2015-08-10 DIAGNOSIS — Z1231 Encounter for screening mammogram for malignant neoplasm of breast: Secondary | ICD-10-CM

## 2015-08-10 IMAGING — CT CT CHEST HIGH RESOLUTION W/O CM
1 of 5 series · 4 of 36 positions shown, 5 images · non-contrast
Comparison: Chest CT 04/09/2012.

CLINICAL DATA: 73-year-old female with history of dyspnea and
shortness of breath.

EXAM:
CT CHEST WITHOUT CONTRAST
TECHNIQUE: Multidetector CT imaging of the chest was performed following the
standard protocol without intravenous contrast. High resolution
imaging of the lungs, as well as inspiratory and expiratory imaging,
was performed.

[Series 602: cor · coronal · 0.56mm/px · 4 of 113 slices shown, 5 images]
[im 23/113  mediastinal]
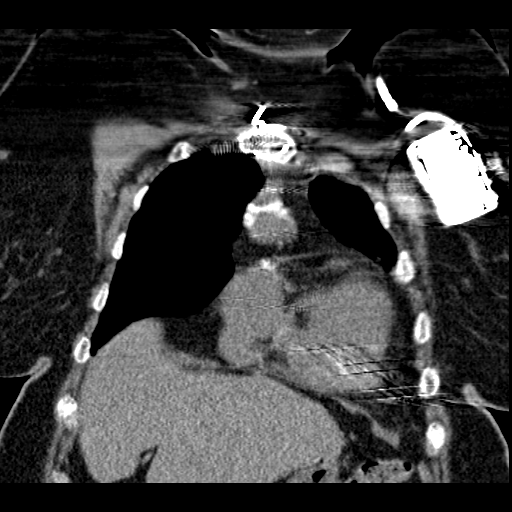
[im 23/113  lung]
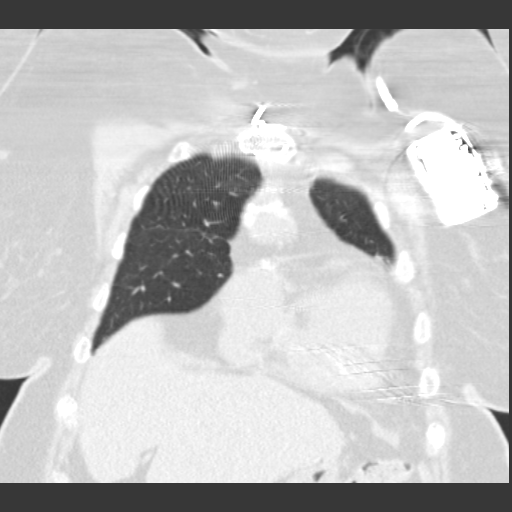
[im 45/113  lung]
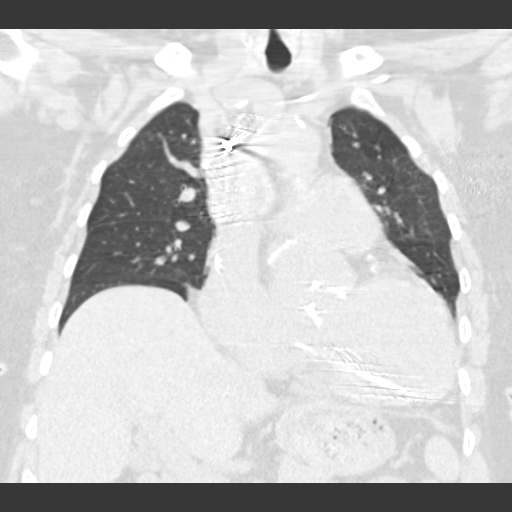
[im 68/113  lung]
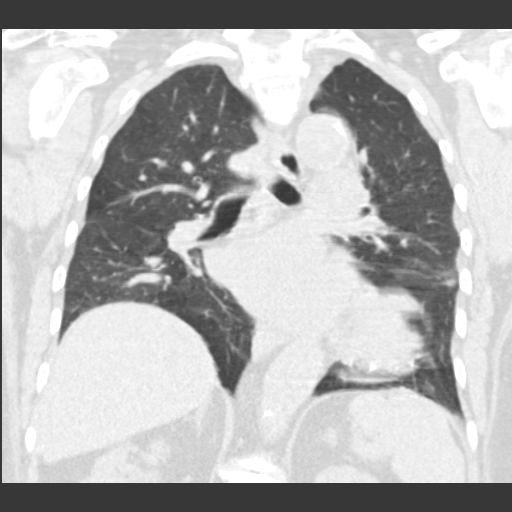
[im 90/113  lung]
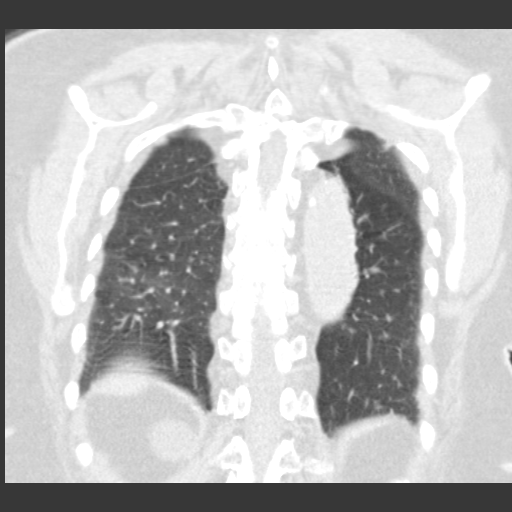

[4 of 36 positions shown; findings below may reference images not displayed]

FINDINGS: Mediastinum: Heart size is mildly enlarged. There is no significant
pericardial fluid, thickening or pericardial calcification. There is
atherosclerosis of the thoracic aorta, the great vessels of the
mediastinum and the coronary arteries, including calcified
atherosclerotic plaque in the left main, left anterior descending,
left circumflex and right coronary arteries. Status post median
sternotomy for aortic valve replacement with a bioprosthetic valve.
The sinuses of Valsalva appear dilated, estimated to measure
approximately 5 cm in diameter on today's noncontrast CT
examination. Ascending aorta is also mildly aneurysmal measuring
cm in diameter. No pathologically enlarged mediastinal or hilar
lymph nodes. Please note that accurate exclusion of hilar adenopathy
is limited on noncontrast CT scans. Esophagus is unremarkable in
appearance.

Lungs/Pleura: High-resolution images demonstrate no significant
ground-glass attenuation, subpleural reticulation, parenchymal
banding, traction bronchiectasis or frank honeycombing to suggest
interstitial lung disease at this time. Inspiratory and expiratory
imaging demonstrates very mild air trapping, indicative of small
airways disease. No acute consolidative airspace disease. No pleural
effusions. No suspicious appearing pulmonary nodules or masses.

Upper Abdomen: Exophytic 4.3 cm low-attenuation lesion in the upper
pole of the right kidney, previously characterized as a simple cyst.

Musculoskeletal: There are no aggressive appearing lytic or blastic
lesions noted in the visualized portions of the skeleton. Median
sternotomy wires.
IMPRESSION: 1. No findings to suggest interstitial lung disease at this time.
2. Evidence of mild air trapping, indicative of small airways
disease.
3. Mild cardiomegaly.
4. Atherosclerosis, including left main and 3 vessel coronary artery
disease. Assessment for potential risk factor modification, dietary
therapy or pharmacologic therapy may be warranted, if clinically
indicated.
5. Status post median sternotomy for aortic valve replacement with
bioprosthetic aortic valve. There is aneurysmal dilatation of the
ascending thoracic aorta (4.5 cm in diameter), and the sinuses of
Valsalva (estimated to measure approximately 5 cm in diameter).

## 2015-08-20 ENCOUNTER — Ambulatory Visit: Payer: Medicare Other

## 2015-08-22 ENCOUNTER — Ambulatory Visit
Admission: RE | Admit: 2015-08-22 | Discharge: 2015-08-22 | Disposition: A | Discharge status: Home / Self Care | Payer: Medicare Other | Source: Ambulatory Visit

## 2015-08-22 DIAGNOSIS — Z1231 Encounter for screening mammogram for malignant neoplasm of breast: Secondary | ICD-10-CM

## 2015-08-27 ENCOUNTER — Other Ambulatory Visit: Payer: Self-pay | Admitting: Family

## 2015-08-27 DIAGNOSIS — R928 Other abnormal and inconclusive findings on diagnostic imaging of breast: Secondary | ICD-10-CM

## 2015-08-31 ENCOUNTER — Ambulatory Visit
Admission: RE | Admit: 2015-08-31 | Discharge: 2015-08-31 | Disposition: A | Discharge status: Home / Self Care | Payer: Medicare Other | Source: Ambulatory Visit | Attending: Family | Admitting: Family

## 2015-08-31 DIAGNOSIS — R928 Other abnormal and inconclusive findings on diagnostic imaging of breast: Secondary | ICD-10-CM

## 2015-09-10 ENCOUNTER — Telehealth: Payer: Self-pay

## 2015-09-10 NOTE — Telephone Encounter (Signed)
Called patient one Phone# unable to leave messaqe VM not set up, Second number no answer.

## 2015-09-11 ENCOUNTER — Ambulatory Visit (INDEPENDENT_AMBULATORY_CARE_PROVIDER_SITE_OTHER): Payer: Medicare Other | Admitting: Family

## 2015-09-11 ENCOUNTER — Encounter: Payer: Self-pay | Admitting: Family

## 2015-09-11 ENCOUNTER — Telehealth: Payer: Self-pay | Admitting: Family

## 2015-09-11 VITALS — BP 137/79 | HR 55 | Temp 97.9°F | Resp 16 | Ht 61.5 in | Wt 206.0 lb

## 2015-09-11 DIAGNOSIS — I10 Essential (primary) hypertension: Secondary | ICD-10-CM

## 2015-09-11 DIAGNOSIS — Z0000 Encounter for general adult medical examination without abnormal findings: Secondary | ICD-10-CM | POA: Insufficient documentation

## 2015-09-11 DIAGNOSIS — Z23 Encounter for immunization: Secondary | ICD-10-CM | POA: Diagnosis not present

## 2015-09-11 LAB — BASIC METABOLIC PANEL
BUN: 32 mg/dL — AB (ref 6–23)
CO2: 26 meq/L (ref 19–32)
Calcium: 10.1 mg/dL (ref 8.4–10.5)
Chloride: 104 mEq/L (ref 96–112)
Creatinine, Ser: 1.54 mg/dL — ABNORMAL HIGH (ref 0.40–1.20)
GFR: 35.04 mL/min — AB (ref >60.00)
GLUCOSE: 80 mg/dL (ref 70–99)
POTASSIUM: 4.2 meq/L (ref 3.5–5.1)
SODIUM: 141 meq/L (ref 135–145)

## 2015-09-11 NOTE — Patient Instructions (Signed)
Please complete lab work prior to leaving.   

## 2015-09-11 NOTE — Progress Notes (Signed)
Subjective:    [name] is a 74 y.o. female who presents for Medicare Annual/Subsequent preventive examination.  Preventive Screening-Counseling & Management  Tobacco History  Smoking status  . Never Smoker   Smokeless tobacco  . Never Used     Problems Prior to Visit 1. AF/aortic insufficiency- reports SOB at baseline and weight stable. On coumadin.   2. Hyperlipidemia-  No myalgia on crestor Lab Results  Component Value Date   CHOL 170 02/20/2015   HDL 70.50 02/20/2015   LDLCALC 79 02/20/2015   LDLDIRECT 152.6 09/01/2011   TRIG 104.0 02/20/2015   CHOLHDL 2 02/20/2015   3. HTN- BP stable.   BP Readings from Last 3 Encounters:  09/11/15 137/79  07/12/15 102/60  06/22/15 138/97   Continues senokot for constipation.  Current Problems (verified) Patient Active Problem List   Diagnosis Date Noted  . Gait instability 05/01/2015  . Dyspnea 07/03/2014  . Chronic hypoxemic respiratory failure (HCC) 07/03/2014  . NICM- EF 20% 2013- 45-50% Feb 2015 06/21/2014  . LBBB (left bundle branch block) 06/21/2014  . Chronic anticoagulation- Coumadin 06/21/2014  . Bronchitis with bronchospasm 06/21/2014  . Low back pain 06/13/2014  . Encounter for therapeutic drug monitoring 11/21/2013  . S/P implantation of automatic cardioverter/defibrillator (AICD) 11/21/2013  . CKD (chronic kidney disease), stage III 11/04/2013  . Chronic atrial fibrillation (HCC) 11/04/2013  . Gout 09/19/2013  . Osteopenia 09/01/2013  . Chronic constipation 07/11/2013  . Annual physical exam 07/08/2012  . H/O tissue AVR and AO root replacement 2013 04/27/2012  . Atherosclerosis of native arteries of the extremities with intermittent claudication 03/03/2012  . Anemia 02/17/2012  . Chronic systolic congestive heart failure, NYHA class 3 (HCC) 02/14/2012  . Cardiac arrest (HCC) 11/04/2011  . Implantable cardiac defibrillator single-chamber-Medtronic 11/04/2011  . Alopecia 11/04/2011  . Aortic  insufficiency and aortic stenosis 01/01/2011  . COMBINED HEART FAILURE, ACUTE ON CHRONIC 11/25/2010  . Back pain- s/p recent steroid dose pack 12/19/2009  . Memory loss 09/05/2009  . VENOUS INSUFFICIENCY 01/19/2009  . HYPERLIPIDEMIA 01/02/2009  . HTN (hypertension) 01/02/2009    Medications Prior to Visit Current Outpatient Prescriptions on File Prior to Visit  Medication Sig Dispense Refill  . acetaminophen (TYLENOL) 500 MG tablet Take 500 mg by mouth every 6 (six) hours as needed for moderate pain.    [name] allopurinol (ZYLOPRIM) 100 MG tablet TAKE 1 TABLET (100 MG TOTAL) BY MOUTH 2 (TWO) TIMES DAILY. 180 tablet 3  . amLODipine (NORVASC) 10 MG tablet Take 1 tablet (10 mg total) by mouth daily. 90 tablet 1  . Biotin 5000 MCG CAPS Take 1 capsule (5,000 mcg total) by mouth daily. 30 capsule 6  . bisoprolol (ZEBETA) 10 MG tablet TAKE 1 TABLET BY MOUTH TWICE A DAY 60 tablet 11  . Calcium Carb-Cholecalciferol (CALCIUM-VITAMIN D) 600-400 MG-UNIT TABS Take 1 tablet by mouth daily.    . CRESTOR 5 MG tablet TAKE 1 TABLET BY MOUTH EVERY DAY 30 tablet 11  . CVS ASPIRIN 81 MG EC tablet TAKE 1 TABLET (81 MG TOTAL) BY MOUTH DAILY. 90 tablet 0  . furosemide (LASIX) 40 MG tablet Take 40 mg by mouth 2 (two) times daily.    [name] lisinopril (PRINIVIL,ZESTRIL) 10 MG tablet Take 1 tablet (10 mg total) by mouth daily. 90 tablet 3  . potassium chloride SA (KLOR-CON M20) 20 MEQ tablet Take 1 tablet (20 mEq total) by mouth 3 (three) times daily. 270 tablet 3  . senna-docusate (SENOKOT S) 8.6-50 MG  per tablet Take 2 tablets by mouth at bedtime. For constipation, also available OTC 60 tablet 3  . warfarin (COUMADIN) 5 MG tablet TAKE AS DIRECTED BY COUMADIN CLINIC 30 tablet 3   No current facility-administered medications on file prior to visit.    Current Medications (verified) Current Outpatient Prescriptions  Medication Sig Dispense Refill  . acetaminophen (TYLENOL) 500 MG tablet Take 500 mg by mouth every 6 (six)  hours as needed for moderate pain.    [name] allopurinol (ZYLOPRIM) 100 MG tablet TAKE 1 TABLET (100 MG TOTAL) BY MOUTH 2 (TWO) TIMES DAILY. 180 tablet 3  . amLODipine (NORVASC) 10 MG tablet Take 1 tablet (10 mg total) by mouth daily. 90 tablet 1  . Biotin 5000 MCG CAPS Take 1 capsule (5,000 mcg total) by mouth daily. 30 capsule 6  . bisoprolol (ZEBETA) 10 MG tablet TAKE 1 TABLET BY MOUTH TWICE A DAY 60 tablet 11  . Calcium Carb-Cholecalciferol (CALCIUM-VITAMIN D) 600-400 MG-UNIT TABS Take 1 tablet by mouth daily.    . CRESTOR 5 MG tablet TAKE 1 TABLET BY MOUTH EVERY DAY 30 tablet 11  . CVS ASPIRIN 81 MG EC tablet TAKE 1 TABLET (81 MG TOTAL) BY MOUTH DAILY. 90 tablet 0  . furosemide (LASIX) 40 MG tablet Take 40 mg by mouth 2 (two) times daily.    [name] lisinopril (PRINIVIL,ZESTRIL) 10 MG tablet Take 1 tablet (10 mg total) by mouth daily. 90 tablet 3  . potassium chloride SA (KLOR-CON M20) 20 MEQ tablet Take 1 tablet (20 mEq total) by mouth 3 (three) times daily. 270 tablet 3  . senna-docusate (SENOKOT S) 8.6-50 MG per tablet Take 2 tablets by mouth at bedtime. For constipation, also available OTC 60 tablet 3  . warfarin (COUMADIN) 5 MG tablet TAKE AS DIRECTED BY COUMADIN CLINIC 30 tablet 3   No current facility-administered medications for this visit.     Allergies (verified) Adhesive; Latex; and Neomycin-bacitracin zn-polymyx   PAST HISTORY  Family History Family History  Problem Relation Age of Onset  . Hypertension Father   . Cancer Father   . Coronary artery disease Father 2  . Cancer Son     pancreas, spine    Social History Social History  Substance Use Topics  . Smoking status: Never Smoker   . Smokeless tobacco: Never Used  . Alcohol Use: No     Are there smokers in your home (other than you)? No  Risk Factors Current exercise habits: not exercising  Dietary issues discussed: healthy   Cardiac risk factors: advanced age (older than 84 for men, 37 for women),  dyslipidemia, hypertension, obesity (BMI >= 30 kg/m2) and sedentary lifestyle.  Depression Screen (Note: if answer to either of the following is "Yes", a more complete depression screening is indicated)   Over the past two weeks, have you felt down, depressed or hopeless? No  Over the past two weeks, have you felt little interest or pleasure in doing things? No  Have you lost interest or pleasure in daily life? No  Do you often feel hopeless? No  Do you cry easily over simple problems? No  Activities of Daily Living In your present state of health, do you have any difficulty performing the following activities?:  Driving? No Managing money?  No Feeding yourself? No Getting from bed to chair? No  Climbing a flight of stairs? Yes Preparing food and eating?: No Bathing or showering? No Getting dressed: No Getting to the toilet? No Using the toilet:No  Moving around from place to place: No In the past year have you fallen or had a near fall?:No   Are you sexually active?  No  Do you have more than one partner?  No  Hearing Difficulties: Yes Do you often ask people to speak up or repeat themselves? Yes Do you experience ringing or noises in your ears? No Do you have difficulty understanding soft or whispered voices? Yes   Do you feel that you have a problem with memory? Yes  Do you often misplace items? No  Do you feel safe at home?  Yes  Cognitive Testing  Alert? Yes  Normal Appearance?Yes  Oriented to person? Yes  Place? Yes   Time? Yes  Recall of three objects?  Yes  Can perform simple calculations? Yes  Displays appropriate judgment?Yes  Can read the correct time from a watch face?Yes   Advanced Directives have been discussed with the patient? Yes  List the Names of Other Physician/Practitioners you currently use: 1.  See snapshot  Indicate any recent Medical Services you may have received from other than [facility] providers in the past year (date may be  approximate).  Immunization History  Administered Date(s) Administered  . Influenza Split 07/08/2012  . Influenza Whole 08/14/2008, 05/29/2009, 06/17/2010  . Influenza,inj,Quad PF,36+ Mos 06/21/2013  . Influenza-Unspecified 05/29/2014, 06/05/2015  . PPD Test 03/27/2014  . Pneumococcal Conjugate-13 09/14/2013  . Pneumococcal Polysaccharide-23 01/19/2006, 07/08/2012  . Td 01/15/2004  . Zoster 07/11/2013    Screening Tests Health Maintenance  Topic Date Due  . TETANUS/TDAP  01/14/2014  . COLONOSCOPY  09/10/2016 (Originally 02/05/1992)  . INFLUENZA VACCINE  04/29/2016  . MAMMOGRAM  08/21/2017  . DEXA SCAN  Completed  . ZOSTAVAX  Completed  . PNA vac Low Risk Adult  Completed    All answers were reviewed with the patient and necessary referrals were made:  O'SULLIVAN,Atwell Mcdanel S., NP   09/11/2015   History reviewed: allergies, current medications, past medical history, past social history, past surgical history and problem list  Review of Systems Review of Systems  Constitutional: Negative for fever.  Respiratory:       Mild cough  Cardiovascular: Positive for leg swelling. Negative for palpitations.       Notes intermittent chest pain "for a long time."    Gastrointestinal: Positive for constipation.  Genitourinary: Negative for dysuria and frequency.  Musculoskeletal: Negative for joint pain.  Skin: Negative for rash.  Neurological:       Occasional dizziness  Psychiatric/Behavioral: Negative for depression.     Objective:   Body mass index is 38.3 kg/(m^2). BP 137/79 mmHg  Pulse 55  Temp(Src) 97.9 F (36.6 C) (Oral)  Resp 16  Ht 5' 1.5" (1.562 m)  Wt 206 lb (93.441 kg)  BMI 38.30 kg/m2  SpO2 100%         Assessment & Plan:      Assessment:          Plan:     During the course of the visit the patient was educated and counseled about appropriate screening and preventive services including:    Advanced directives: requested copies.   has HCPOA-  discussed MOST and out of hospital DNR- she will think about this.  Diet review for nutrition referral? Yes ____  Not Indicated __x__   Patient Instructions (the written plan) was given to the patient.  Medicare Attestation I have personally reviewed: The patient's medical and social history Their use of alcohol, tobacco or illicit drugs  Their current medications and supplements The patient's functional ability including ADLs,fall risks, home safety risks, cognitive, and hearing and visual impairment Diet and physical activities Evidence for depression or mood disorders  The patient's weight, height, BMI, and visual acuity have been recorded in the chart.  I have made referrals, counseling, and provided education to the patient based on review of the above and I have provided the patient with a written personalized care plan for preventive services.     O'SULLIVAN,Chynah Orihuela S., NP   09/11/2015

## 2015-09-11 NOTE — Telephone Encounter (Signed)
Since we are unsure of her last colo date, I would recommend cologuard. I also forgot to provide her a copy of the MOST form, could you please mail to her?

## 2015-09-11 NOTE — Assessment & Plan Note (Addendum)
Discussed healthy diet. Unfortunately, exercise is difficult for her.  We did discuss PT referral but pt declines.  Will refer for cologuard.   Suspect right abdominal tenderness is musculoskeletal- advised pt to let me know if not improved in 1 week.

## 2015-09-11 NOTE — Addendum Note (Signed)
Addended by: Sandford Craze on: 09/11/2015 01:20 PM   Modules accepted: Kipp Brood

## 2015-09-11 NOTE — Telephone Encounter (Signed)
Notified pt and she is agreeable to proceed with Cologard. Mailed MOST form to pt. Cologard order entered.

## 2015-09-11 NOTE — Progress Notes (Addendum)
Patient ID: [name], female   DOB: 12/14/1940, 74 y.o.   MRN: [id number] Patient presents today for complete physical.  Immunizations: prevnar/flu shot today.   Diet: healthy Exercise: no Colonoscopy: ? Several years ago Dexa:08/30/13 Pap Smear: declines Mammogram: up to date   Review of Systems  Review of Systems  Constitutional: Negative for fever.  Respiratory:  Mild cough  Cardiovascular: Positive for leg swelling. Negative for palpitations.  Notes intermittent chest pain "for a long time."  Gastrointestinal: Positive for constipation.  Genitourinary: Negative for dysuria and frequency.  Musculoskeletal: Negative for joint pain.  Skin: Negative for rash.  Neurological:  Occasional dizziness  Psychiatric/Behavioral: Negative for depression.   Past Medical History  Diagnosis Date  . History of ventricular tachycardia   . Peripheral edema   . Aortic insufficiency     s/p tissue AVR 2013  . History of gout   . Exogenous obesity   . History of CHF (congestive heart failure)   . 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 Hypertension   . A-fib [facility])     Not on coumadin due to intracebral hemorrhage  . DOE (dyspnea on exertion)   . Stroke (HCC) 2000  . Renal insufficiency   . Anxiety   . 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 Anemia   . Presence of IVC filter 2000  . Osteopenia 09/01/2013  . Coronary artery disease 2013    non obstructive  . Myocardial infarction (HCC) 2000  . DVT (deep venous thrombosis) (HCC)     Social History   Social History  . Marital Status: Divorced    Spouse Name: N/A  . Number of Children: N/A  . Years of Education: N/A   Occupational History  . Not on file.   Social History Main Topics  . Smoking status: Never Smoker   . Smokeless tobacco: Never Used  . Alcohol Use: No  . Drug Use: No  . Sexual Activity: No   Other Topics Concern  . Not  on file   Social History Narrative   Lives with daughter.      Past Surgical History  Procedure Laterality Date  . Cardiac defibrillator placement  2013    MDT ICD  . Cataract extraction    . Embolectomy  02/15/2012    Procedure: EMBOLECTOMY;  Surgeon: [name], MD;  Location: [facility] OR;  Service: Vascular;  Laterality: Left;  left popliteal embolectomy with drain placement  . Eye surgery  2012    removal of cataracts bilaterally  . Cardiac catheterization  7/13    non obstructive CAD  . Tonsillectomy    . Aortic valve replacement  04/12/2012    Procedure: AORTIC VALVE REPLACEMENT (AVR);  Surgeon: Delight Ovens, MD;  Location: [facility] OR;  Service: Open Heart Surgery;  Laterality: N/A;  with nitric oxide  . Thoracic aortic aneurysm repair  04/12/2012    Procedure: THORACIC ASCENDING ANEURYSM REPAIR (AAA);  Surgeon: Delight Ovens, MD;  Location: [facility] OR;  Service: Open Heart Surgery;  Laterality: N/A;  . Clot removal  5.2013    blood clot removed from the back of the left leg  . Stomach surgery      lap band placed and later removed  . Left heart catheterization with coronary angiogram N/A 04/08/2012    Procedure: LEFT HEART CATHETERIZATION WITH CORONARY ANGIOGRAM;  Surgeon: Tonny Bollman, MD;  Location: [facility] CATH LAB;  Service:  Cardiovascular;  Laterality: N/A;    Family History  Problem Relation Age of Onset  . Hypertension Father   . Cancer Father   . Coronary artery disease Father 73  . Cancer Son     pancreas, spine    Allergies  Allergen Reactions  . Adhesive [Tape] Itching, Rash and Other (See Comments)    Redness and skin breaks down  . Latex Rash  . Neomycin-Bacitracin Zn-Polymyx Rash    Current Outpatient Prescriptions on File Prior to Visit  Medication Sig Dispense Refill  . acetaminophen (TYLENOL) 500 MG tablet Take 500 mg by mouth every 6 (six) hours as needed for moderate pain.    [name] allopurinol (ZYLOPRIM) 100 MG tablet TAKE 1 TABLET (100 MG TOTAL)  BY MOUTH 2 (TWO) TIMES DAILY. 180 tablet 3  . amLODipine (NORVASC) 10 MG tablet Take 1 tablet (10 mg total) by mouth daily. 90 tablet 1  . Biotin 5000 MCG CAPS Take 1 capsule (5,000 mcg total) by mouth daily. 30 capsule 6  . bisoprolol (ZEBETA) 10 MG tablet TAKE 1 TABLET BY MOUTH TWICE A DAY 60 tablet 11  . Calcium Carb-Cholecalciferol (CALCIUM-VITAMIN D) 600-400 MG-UNIT TABS Take 1 tablet by mouth daily.    . CRESTOR 5 MG tablet TAKE 1 TABLET BY MOUTH EVERY DAY 30 tablet 11  . [employer] ASPIRIN 81 MG EC tablet TAKE 1 TABLET (81 MG TOTAL) BY MOUTH DAILY. 90 tablet 0  . furosemide (LASIX) 40 MG tablet Take 40 mg by mouth 2 (two) times daily.    [name] lisinopril (PRINIVIL,ZESTRIL) 10 MG tablet Take 1 tablet (10 mg total) by mouth daily. 90 tablet 3  . potassium chloride SA (KLOR-CON M20) 20 MEQ tablet Take 1 tablet (20 mEq total) by mouth 3 (three) times daily. 270 tablet 3  . senna-docusate (SENOKOT S) 8.6-50 MG per tablet Take 2 tablets by mouth at bedtime. For constipation, also available OTC 60 tablet 3  . warfarin (COUMADIN) 5 MG tablet TAKE AS DIRECTED BY COUMADIN CLINIC 30 tablet 3   No current facility-administered medications on file prior to visit.    BP 137/79 mmHg  Pulse 55  Temp(Src) 97.9 F (36.6 C) (Oral)  Resp 16  Ht 5' 1.5" (1.562 m)  Wt 206 lb (93.441 kg)  BMI 38.30 kg/m2  SpO2 100%     Physical Exam  Constitutional: She is oriented to person, place, and time. She appears well-developed and well-nourished. No distress.  HENT:  Head: Normocephalic and atraumatic.  Right Ear: Tympanic membrane and ear canal normal.  Left Ear: Tympanic membrane and ear canal normal.  Mouth/Throat: Oropharynx is clear and moist.  Eyes: Pupils are equal, round, and reactive to light. No scleral icterus.  Neck: Normal range of motion. No thyromegaly present.  Cardiovascular: Normal rate and regular rhythm.   No murmur heard. Pulmonary/Chest: Effort normal and breath sounds normal. No  respiratory distress. He has no wheezes. She has no rales. She exhibits no tenderness.  Abdominal: Soft. Bowel sounds are normal. He exhibits no distension and no mass. There is mild tenderness to palpation of the right upper abdomen/left lower rib area- no mass.  There is no rebound and no guarding.  Musculoskeletal: She exhibits no edema.  Lymphadenopathy:    She has no cervical adenopathy.  Neurological: She is alert and oriented to person, place, and time.  She exhibits normal muscle tone. Coordination normal.  Skin: Skin is warm and dry.  Psychiatric: She has a normal mood and affect. Her  behavior is normal. Judgment and thought content normal.  Breast/pelvic:  Deferred.

## 2015-09-11 NOTE — Progress Notes (Deleted)
   Subjective:    Patient ID: [name], female    DOB: 1941/07/26, 74 y.o.   MRN: [id number]  HPI    Review of Systems     Objective:   Physical Exam        Assessment & Plan:

## 2015-09-11 NOTE — Addendum Note (Signed)
Addended by: Mervin Kung A on: 09/11/2015 05:29 PM   Modules accepted: Orders

## 2015-09-14 ENCOUNTER — Telehealth: Payer: Self-pay | Admitting: Family

## 2015-09-14 NOTE — Telephone Encounter (Signed)
Caller name: Pam with [facility] Can be reached: [phone]  Reason for call: Previous pt with [facility]. Pt called in and reported to nurse via phone that she has fallen several times in the past couple weeks and has been having balance issues. They would like orders for eval for PT. Please call to give VO.

## 2015-09-17 NOTE — Telephone Encounter (Signed)
Yes, please- give order for [facility] PT.

## 2015-09-17 NOTE — Telephone Encounter (Signed)
Notified Pam. She also requests pt's most recent office note to [phone]. Note faxed.

## 2015-09-19 ENCOUNTER — Ambulatory Visit (INDEPENDENT_AMBULATORY_CARE_PROVIDER_SITE_OTHER): Payer: Medicare Other | Admitting: *Deleted

## 2015-09-19 DIAGNOSIS — Z5181 Encounter for therapeutic drug level monitoring: Secondary | ICD-10-CM | POA: Diagnosis not present

## 2015-09-19 DIAGNOSIS — Z952 Presence of prosthetic heart valve: Secondary | ICD-10-CM

## 2015-09-19 DIAGNOSIS — Z954 Presence of other heart-valve replacement: Secondary | ICD-10-CM | POA: Diagnosis not present

## 2015-09-19 DIAGNOSIS — I4891 Unspecified atrial fibrillation: Secondary | ICD-10-CM | POA: Diagnosis not present

## 2015-09-19 DIAGNOSIS — I352 Nonrheumatic aortic (valve) stenosis with insufficiency: Secondary | ICD-10-CM

## 2015-09-19 LAB — POCT INR: INR: 2.3

## 2015-09-25 ENCOUNTER — Telehealth: Payer: Self-pay | Admitting: Family

## 2015-09-25 DIAGNOSIS — R296 Repeated falls: Secondary | ICD-10-CM

## 2015-09-25 NOTE — Telephone Encounter (Signed)
Caller name: [place] Relationship to patient: [facility] Can be reached: [phone]   Reason for call: [facility] called to inform that patient declined Physical Therapy with them and wants to do Outpatient PT

## 2015-09-25 NOTE — Telephone Encounter (Signed)
Spoke with [place] at [place], Pt requesting PT close to home, doesn't want PT in home. Referral placed to [facility] PT dept.

## 2015-10-15 NOTE — Telephone Encounter (Signed)
Received result from Cologuard that result is negative. Letter has been mailed to pt.

## 2015-10-31 ENCOUNTER — Ambulatory Visit (INDEPENDENT_AMBULATORY_CARE_PROVIDER_SITE_OTHER): Payer: Medicare Other | Admitting: *Deleted

## 2015-10-31 DIAGNOSIS — Z954 Presence of other heart-valve replacement: Secondary | ICD-10-CM | POA: Diagnosis not present

## 2015-10-31 DIAGNOSIS — Z952 Presence of prosthetic heart valve: Secondary | ICD-10-CM

## 2015-10-31 DIAGNOSIS — I352 Nonrheumatic aortic (valve) stenosis with insufficiency: Secondary | ICD-10-CM | POA: Diagnosis not present

## 2015-10-31 DIAGNOSIS — Z5181 Encounter for therapeutic drug level monitoring: Secondary | ICD-10-CM | POA: Diagnosis not present

## 2015-10-31 DIAGNOSIS — I4891 Unspecified atrial fibrillation: Secondary | ICD-10-CM

## 2015-10-31 LAB — POCT INR: INR: 2.8

## 2015-11-14 ENCOUNTER — Ambulatory Visit: Payer: Medicare Other | Admitting: Nurse Practitioner

## 2015-12-12 ENCOUNTER — Ambulatory Visit (INDEPENDENT_AMBULATORY_CARE_PROVIDER_SITE_OTHER): Payer: Medicare Other | Admitting: *Deleted

## 2015-12-12 DIAGNOSIS — I4891 Unspecified atrial fibrillation: Secondary | ICD-10-CM

## 2015-12-12 DIAGNOSIS — I352 Nonrheumatic aortic (valve) stenosis with insufficiency: Secondary | ICD-10-CM

## 2015-12-12 DIAGNOSIS — Z954 Presence of other heart-valve replacement: Secondary | ICD-10-CM

## 2015-12-12 DIAGNOSIS — Z5181 Encounter for therapeutic drug level monitoring: Secondary | ICD-10-CM

## 2015-12-12 DIAGNOSIS — Z952 Presence of prosthetic heart valve: Secondary | ICD-10-CM

## 2015-12-12 LAB — POCT INR: INR: 3.2

## 2015-12-17 ENCOUNTER — Other Ambulatory Visit: Payer: Self-pay | Admitting: Cardiology

## 2015-12-19 ENCOUNTER — Telehealth: Payer: Self-pay

## 2015-12-19 ENCOUNTER — Telehealth: Payer: Self-pay | Admitting: Cardiology

## 2015-12-19 NOTE — Telephone Encounter (Signed)
Error

## 2015-12-19 NOTE — Telephone Encounter (Signed)
Prior auth for Bisoprolol submitted to [facility].

## 2015-12-21 ENCOUNTER — Telehealth: Payer: Self-pay

## 2015-12-21 NOTE — Telephone Encounter (Signed)
Bisoprolol 10mg  approved by [facility]. Pharmacy notified.

## 2016-01-02 ENCOUNTER — Encounter: Payer: Self-pay | Admitting: Cardiology

## 2016-01-02 ENCOUNTER — Ambulatory Visit (INDEPENDENT_AMBULATORY_CARE_PROVIDER_SITE_OTHER): Payer: Medicare Other | Admitting: *Deleted

## 2016-01-02 ENCOUNTER — Ambulatory Visit (INDEPENDENT_AMBULATORY_CARE_PROVIDER_SITE_OTHER): Payer: Medicare Other | Admitting: Cardiology

## 2016-01-02 VITALS — BP 130/80 | HR 56 | Ht 62.0 in | Wt 208.4 lb

## 2016-01-02 DIAGNOSIS — Z5181 Encounter for therapeutic drug level monitoring: Secondary | ICD-10-CM

## 2016-01-02 DIAGNOSIS — I4891 Unspecified atrial fibrillation: Secondary | ICD-10-CM | POA: Diagnosis not present

## 2016-01-02 DIAGNOSIS — Z954 Presence of other heart-valve replacement: Secondary | ICD-10-CM

## 2016-01-02 DIAGNOSIS — Z952 Presence of prosthetic heart valve: Secondary | ICD-10-CM

## 2016-01-02 DIAGNOSIS — I5032 Chronic diastolic (congestive) heart failure: Secondary | ICD-10-CM

## 2016-01-02 DIAGNOSIS — I352 Nonrheumatic aortic (valve) stenosis with insufficiency: Secondary | ICD-10-CM

## 2016-01-02 DIAGNOSIS — I4821 Permanent atrial fibrillation: Secondary | ICD-10-CM

## 2016-01-02 DIAGNOSIS — I482 Chronic atrial fibrillation: Secondary | ICD-10-CM | POA: Diagnosis not present

## 2016-01-02 DIAGNOSIS — Z7901 Long term (current) use of anticoagulants: Secondary | ICD-10-CM

## 2016-01-02 LAB — POCT INR: INR: 3.3

## 2016-01-02 NOTE — Patient Instructions (Signed)
Medication Instructions:  Your physician recommends that you continue on your current medications as directed. Please refer to the Current Medication list given to you today.  Labwork: None ordered  Testing/Procedures: None ordered  Follow-Up: Your physician wants you to follow-up in: 6 months with Dr. Skains. You will receive a reminder letter in the mail two months in advance. If you don't receive a letter, please call our office to schedule the follow-up appointment.  If you need a refill on your cardiac medications before your next appointment, please call your pharmacy.  Thank you for choosing [facility]!!         

## 2016-01-02 NOTE — Progress Notes (Addendum)
Cardiology Office Note    Date:  01/02/2016   ID:  [name], DOB 10-02-1940, MRN [id number]  PCP:  Lemont Fillers., NP  Cardiologist:   Donato Schultz, MD  (Former [place])  Chief Complaint  Patient presents with  . New Patient (Initial Visit)    c/o le edema (unchanged). denies sob, cp, claudication  . Hypertension    History of Present Illness:  [name] is a 75 y.o. female former patient of Dr. Yevonne [name] status post replacement of aortic valve and ascending aortic root, Bentall, Dr. Tyrone [name], bioprosthetic valve with dilated left ventricle, now improved with EF 50-55%. Pacemaker.  Previous he hospitalized with acute on chronic diastolic heart failure in September 2015. Pulmonary pressure 40. She was found to have hypoxemia with effort and now on home oxygen. Dr. Kendrick [name] has been following.   She has had a couple falls. Memory has worsened.   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  No chest pain, no shortness of breath, no syncope, no bleeding.   Past Medical History  Diagnosis Date  . History of ventricular tachycardia   . Peripheral edema   . Aortic insufficiency     s/p tissue AVR 2013  . History of gout   . Exogenous obesity   . History of CHF (congestive heart failure)   . 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 Hypertension   . A-fib [facility])     Not on coumadin due to intracebral hemorrhage  . DOE (dyspnea on exertion)   . Stroke (HCC) 2000  . Renal insufficiency   . Anxiety   . 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 Anemia   . Presence of IVC filter 2000  . Osteopenia 09/01/2013  . Coronary artery disease 2013    non obstructive  . Myocardial infarction (HCC) 2000  . DVT (deep venous thrombosis) [facility])     Past Surgical History  Procedure Laterality Date  . Cardiac defibrillator placement  2013    MDT ICD  . Cataract  extraction    . Embolectomy  02/15/2012    Procedure: EMBOLECTOMY;  Surgeon: [name], MD;  Location: [facility] OR;  Service: Vascular;  Laterality: Left;  left popliteal embolectomy with drain placement  . Eye surgery  2012    removal of cataracts bilaterally  . Cardiac catheterization  7/13    non obstructive CAD  . Tonsillectomy    . Aortic valve replacement  04/12/2012    Procedure: AORTIC VALVE REPLACEMENT (AVR);  Surgeon: Delight Ovens, MD;  Location: [facility] OR;  Service: Open Heart Surgery;  Laterality: N/A;  with nitric oxide  . Thoracic aortic aneurysm repair  04/12/2012    Procedure: THORACIC ASCENDING ANEURYSM REPAIR (AAA);  Surgeon: Delight Ovens, MD;  Location: [facility] OR;  Service: Open Heart Surgery;  Laterality: N/A;  . Clot removal  5.2013    blood clot removed from the back of the left leg  . Stomach surgery      lap band placed and later removed  . Left heart catheterization with coronary angiogram N/A 04/08/2012    Procedure: LEFT HEART CATHETERIZATION WITH CORONARY ANGIOGRAM;  Surgeon: Tonny Bollman, MD;  Location: [facility] CATH LAB;  Service: Cardiovascular;  Laterality: N/A;    Current Medications: Outpatient Prescriptions Prior to Visit  Medication Sig Dispense Refill  . acetaminophen (TYLENOL) 500 MG tablet Take 500 mg by mouth  every 6 (six) hours as needed for moderate pain.    [name] allopurinol (ZYLOPRIM) 100 MG tablet TAKE 1 TABLET (100 MG TOTAL) BY MOUTH 2 (TWO) TIMES DAILY. 180 tablet 3  . amLODipine (NORVASC) 10 MG tablet Take 1 tablet (10 mg total) by mouth daily. 90 tablet 1  . Biotin 5000 MCG CAPS Take 1 capsule (5,000 mcg total) by mouth daily. 30 capsule 6  . bisoprolol (ZEBETA) 10 MG tablet TAKE ONE TABLET BY MOUTH TWICE DAILY 60 tablet 1  . Calcium Carb-Cholecalciferol (CALCIUM-VITAMIN D) 600-400 MG-UNIT TABS Take 1 tablet by mouth daily.    . CRESTOR 5 MG tablet TAKE 1 TABLET BY MOUTH EVERY DAY 30 tablet 11  . CVS ASPIRIN 81 MG EC tablet TAKE 1 TABLET (81  MG TOTAL) BY MOUTH DAILY. 90 tablet 0  . furosemide (LASIX) 40 MG tablet Take 40 mg by mouth 2 (two) times daily.    [name] lisinopril (PRINIVIL,ZESTRIL) 10 MG tablet Take 1 tablet (10 mg total) by mouth daily. 90 tablet 3  . potassium chloride SA (KLOR-CON M20) 20 MEQ tablet Take 1 tablet (20 mEq total) by mouth 3 (three) times daily. 270 tablet 3  . senna-docusate (SENOKOT S) 8.6-50 MG per tablet Take 2 tablets by mouth at bedtime. For constipation, also available OTC 60 tablet 3  . warfarin (COUMADIN) 5 MG tablet TAKE AS DIRECTED BY COUMADIN CLINIC 30 tablet 3   No facility-administered medications prior to visit.     Allergies:   Adhesive; Latex; and Neomycin-bacitracin zn-polymyx   Social History   Social History  . Marital Status: Divorced    Spouse Name: N/A  . Number of Children: N/A  . Years of Education: N/A   Social History Main Topics  . Smoking status: Never Smoker   . Smokeless tobacco: Never Used  . Alcohol Use: No  . Drug Use: No  . Sexual Activity: No   Other Topics Concern  . None   Social History Narrative   Lives with daughter.       Family History:  The patient's family history includes Cancer in her father and son; Coronary artery disease (age of onset: 55) in her father; Hypertension in her father.   ROS:   Please see the history of present illness.    ROS All other systems reviewed and are negative.   PHYSICAL EXAM:   VS:  BP 130/80 mmHg  Pulse 56  Ht 5\' 2"  (1.575 m)  Wt 208 lb 6.4 oz (94.53 kg)  BMI 38.11 kg/m2   GEN: Well nourished, well developed, in no acute distress HEENT: normal Neck: no JVD, carotid bruits, or masses Cardiac: Irregularly irregular; no murmurs, rubs, or gallops,no edema  Respiratory:  clear to auscultation bilaterally, normal work of breathing GI: soft, nontender, nondistended, + BS MS: no deformity or atrophy Skin: warm and dry, no rash Neuro:  Alert and Oriented x 3, Strength and sensation are intact Psych: euthymic  mood, full affect  Wt Readings from Last 3 Encounters:  01/02/16 208 lb 6.4 oz (94.53 kg)  09/11/15 206 lb (93.441 kg)  07/12/15 206 lb 12.8 oz (93.804 kg)      Studies/Labs Reviewed:   EKG:  EKG is not ordered today. October 2016-pacemaker noted, atrial fibrillation personally viewed..   Recent Labs: 01/23/2015: TSH 1.97 06/15/2015: ALT 13* 06/22/2015: Hemoglobin 13.2; Platelets 279.0 09/11/2015: BUN 32*; Creatinine, Ser 1.54*; Potassium 4.2; Sodium 141   Lipid Panel    Component Value Date/Time   CHOL  170 02/20/2015 0843   TRIG 104.0 02/20/2015 0843   HDL 70.50 02/20/2015 0843   CHOLHDL 2 02/20/2015 0843   VLDL 20.8 02/20/2015 0843   LDLCALC 79 02/20/2015 0843   LDLDIRECT 152.6 09/01/2011 1305    Additional studies/ records that were reviewed today include:  Prior echocardiogram, EKG, lab work, hospital stay reviewed.    ASSESSMENT:    1. Permanent atrial fibrillation (HCC)   2. H/O prosthetic aortic valve replacement   3. Chronic diastolic heart failure (HCC)   4. Chronic anticoagulation      PLAN:  In order of problems listed above:  Permanent atrial fibrillation -Stable, doing well, pacemaker for backup, anticoagulation.  Chronic anticoagulation -Coumadin being followed here in the cardiology clinic. No bleeding episodes.  Aortic valve replacement/bioprosthetic with Bentall procedure -Dr. Tyrone [name]. Stable. Dental prophylaxis.  Hyperlipidemia -Continue with Crestor.  Obesity -Encourage weight loss.  Memory impairment -Asked her to discuss this further with her primary physician.    Medication Adjustments/Labs and Tests Ordered: Current medicines are reviewed at length with the patient today.  Concerns regarding medicines are outlined above.  Medication changes, Labs and Tests ordered today are listed in the Patient Instructions below. Patient Instructions  Medication Instructions:  Your physician recommends that you continue on your current  medications as directed. Please refer to the Current Medication list given to you today.  Labwork: None ordered  Testing/Procedures: None ordered  Follow-Up: Your physician wants you to follow-up in: 6 months with Dr. Anne Fu.  You will receive a reminder letter in the mail two months in advance. If you don't receive a letter, please call our office to schedule the follow-up appointment.  If you need a refill on your cardiac medications before your next appointment, please call your pharmacy.  Thank you for choosing [facility]!!            Signed, Donato Schultz, MD  01/02/2016 12:15 PM    [facility] [address] [place], [place], [place]  [address] Phone: [phone]; Fax: [phone]

## 2016-01-14 ENCOUNTER — Encounter: Payer: Medicare Other | Admitting: Cardiology

## 2016-01-30 ENCOUNTER — Ambulatory Visit (INDEPENDENT_AMBULATORY_CARE_PROVIDER_SITE_OTHER): Payer: Medicare Other | Admitting: *Deleted

## 2016-01-30 DIAGNOSIS — Z952 Presence of prosthetic heart valve: Secondary | ICD-10-CM

## 2016-01-30 DIAGNOSIS — Z954 Presence of other heart-valve replacement: Secondary | ICD-10-CM | POA: Diagnosis not present

## 2016-01-30 DIAGNOSIS — Z5181 Encounter for therapeutic drug level monitoring: Secondary | ICD-10-CM

## 2016-01-30 DIAGNOSIS — I352 Nonrheumatic aortic (valve) stenosis with insufficiency: Secondary | ICD-10-CM | POA: Diagnosis not present

## 2016-01-30 DIAGNOSIS — I4891 Unspecified atrial fibrillation: Secondary | ICD-10-CM

## 2016-01-30 LAB — POCT INR: INR: 4

## 2016-02-05 ENCOUNTER — Other Ambulatory Visit: Payer: Self-pay | Admitting: Family

## 2016-02-05 DIAGNOSIS — N6310 Unspecified lump in the right breast, unspecified quadrant: Secondary | ICD-10-CM

## 2016-02-11 ENCOUNTER — Other Ambulatory Visit: Payer: Self-pay | Admitting: *Deleted

## 2016-02-11 MED ORDER — AMLODIPINE BESYLATE 10 MG PO TABS: 10.0000 mg | ORAL_TABLET | Freq: Every day | ORAL | Status: DC

## 2016-02-13 ENCOUNTER — Telehealth: Payer: Self-pay | Admitting: Cardiology

## 2016-02-13 ENCOUNTER — Ambulatory Visit (INDEPENDENT_AMBULATORY_CARE_PROVIDER_SITE_OTHER): Payer: Medicare Other | Admitting: *Deleted

## 2016-02-13 DIAGNOSIS — Z5181 Encounter for therapeutic drug level monitoring: Secondary | ICD-10-CM | POA: Diagnosis not present

## 2016-02-13 DIAGNOSIS — Z954 Presence of other heart-valve replacement: Secondary | ICD-10-CM

## 2016-02-13 DIAGNOSIS — I4891 Unspecified atrial fibrillation: Secondary | ICD-10-CM

## 2016-02-13 DIAGNOSIS — I352 Nonrheumatic aortic (valve) stenosis with insufficiency: Secondary | ICD-10-CM | POA: Diagnosis not present

## 2016-02-13 DIAGNOSIS — Z952 Presence of prosthetic heart valve: Secondary | ICD-10-CM

## 2016-02-13 LAB — POCT INR: INR: 2.6

## 2016-02-13 NOTE — Telephone Encounter (Signed)
Walk In pt form-Handicapped Application-Dropped off gave to Pam/Skains

## 2016-02-14 ENCOUNTER — Other Ambulatory Visit: Payer: Self-pay

## 2016-02-16 ENCOUNTER — Other Ambulatory Visit: Payer: Self-pay | Admitting: Internal Medicine

## 2016-02-18 ENCOUNTER — Other Ambulatory Visit: Payer: Self-pay | Admitting: *Deleted

## 2016-02-18 MED ORDER — WARFARIN SODIUM 5 MG PO TABS: 5.0000 mg | ORAL_TABLET | ORAL | Status: DC

## 2016-02-18 NOTE — Telephone Encounter (Signed)
Rx(s) sent to pharmacy electronically.  

## 2016-02-19 ENCOUNTER — Telehealth: Payer: Self-pay | Admitting: Cardiology

## 2016-02-19 NOTE — Telephone Encounter (Signed)
Patient aware [place] Handicapped paper Signed and ready for pick up.

## 2016-03-13 ENCOUNTER — Ambulatory Visit (INDEPENDENT_AMBULATORY_CARE_PROVIDER_SITE_OTHER): Payer: Medicare Other | Admitting: Internal Medicine

## 2016-03-13 ENCOUNTER — Encounter: Payer: Self-pay | Admitting: Internal Medicine

## 2016-03-13 ENCOUNTER — Ambulatory Visit (INDEPENDENT_AMBULATORY_CARE_PROVIDER_SITE_OTHER): Payer: Medicare Other

## 2016-03-13 VITALS — BP 137/80 | HR 51 | Ht 62.0 in | Wt 211.8 lb

## 2016-03-13 DIAGNOSIS — I4891 Unspecified atrial fibrillation: Secondary | ICD-10-CM

## 2016-03-13 DIAGNOSIS — Z954 Presence of other heart-valve replacement: Secondary | ICD-10-CM

## 2016-03-13 DIAGNOSIS — Z4502 Encounter for adjustment and management of automatic implantable cardiac defibrillator: Secondary | ICD-10-CM

## 2016-03-13 DIAGNOSIS — I482 Chronic atrial fibrillation: Secondary | ICD-10-CM

## 2016-03-13 DIAGNOSIS — I4821 Permanent atrial fibrillation: Secondary | ICD-10-CM

## 2016-03-13 DIAGNOSIS — Z5181 Encounter for therapeutic drug level monitoring: Secondary | ICD-10-CM

## 2016-03-13 DIAGNOSIS — I352 Nonrheumatic aortic (valve) stenosis with insufficiency: Secondary | ICD-10-CM

## 2016-03-13 DIAGNOSIS — Z952 Presence of prosthetic heart valve: Secondary | ICD-10-CM

## 2016-03-13 LAB — CUP PACEART INCLINIC DEVICE CHECK
Battery Voltage: 2.66 V
Brady Statistic RV Percent Paced: 28.25 %
HIGH POWER IMPEDANCE MEASURED VALUE: 53 Ohm
HighPow Impedance: 44 Ohm
Lead Channel Impedance Value: 760 Ohm
Lead Channel Sensing Intrinsic Amplitude: 10.317 mV
Lead Channel Setting Pacing Amplitude: 2.5 V
Lead Channel Setting Pacing Pulse Width: 0.8 ms
Lead Channel Setting Sensing Sensitivity: 0.3 mV
MDC IDC LEAD IMPLANT DT: 20020822
MDC IDC LEAD LOCATION: 753860
MDC IDC LEAD MODEL: 6944
MDC IDC MSMT LEADCHNL RV PACING THRESHOLD AMPLITUDE: 1 V
MDC IDC MSMT LEADCHNL RV PACING THRESHOLD PULSEWIDTH: 0.8 ms
MDC IDC SESS DTM: 20170615170951

## 2016-03-13 LAB — POCT INR: INR: 2.8

## 2016-03-13 NOTE — Patient Instructions (Addendum)
Medication Instructions:  Your physician recommends that you continue on your current medications as directed. Please refer to the Current Medication list given to you today.  Labwork: None ordered  Testing/Procedures: None ordered  Follow-Up: Your physician wants you to follow-up in: 6 months with device clinic.  You will receive a reminder letter in the mail two months in advance. If you don't receive a letter, please call our office to schedule the follow-up appointment.  Your physician wants you to follow-up in: 1 year with Dr. Klein. You will receive a reminder letter in the mail two months in advance. If you don't receive a letter, please call our office to schedule the follow-up appointment.   If you need a refill on your cardiac medications before your next appointment, please call your pharmacy.  Thank you for choosing [facility]!!        

## 2016-03-13 NOTE — Progress Notes (Signed)
Patient Care Team: Sandford Craze, NP as PCP - General (Internal Medicine) Duke Salvia, MD (Cardiology) [name], MD (Cardiology) Lupita Leash, MD as Consulting Physician (Pulmonary Disease)   HPI  [name] is a 75 y.o. female is seen in followup for aborted cardiac arrest s/p implanted ICD. 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 She underwent aortic valve  Bioprosthetic replacement fall 2013. She is significant dilatation of her LV   She was hospitalized in February for multifactorial dyspnea including pulmonary edema, pneumonia and some bronchitis.  Her carvedilol was changed to bisoprolol; her exercise tolerance has remained somewhat limited.  She's had struggle with the increasing loss of independence. She tried to drive to [facility] and got lost on her way home. Thankfully her daughter was there.  She feels alone and isolated in her home and without control.     LHC (03/2012): pCFX 30.  Carotid US (03/2012): No sig ICA stenosis.  Echo (07/2012): EF 25-30%.  Echocardiogram 9/15 demonstrated EF of 50-55% with a bioprosthetic aortic valve replacement     Past Medical History  Diagnosis Date  . History of ventricular tachycardia   . Peripheral edema   . Aortic insufficiency     s/p tissue AVR 2013  . History of gout   . Exogenous obesity   . History of CHF (congestive heart failure)   . 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 Hypertension   . 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 DOE (dyspnea on exertion)   . Stroke (HCC) 2000  . Renal insufficiency   . Anxiety   . 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 Anemia   . Presence of IVC filter 2000  . Osteopenia 09/01/2013  . Coronary artery disease 2013    non obstructive  . Myocardial  infarction (HCC) 2000  . DVT (deep venous thrombosis) [facility])     Past Surgical History  Procedure Laterality Date  . Cardiac defibrillator placement  2013    MDT ICD  . Cataract extraction    . Embolectomy  02/15/2012    Procedure: EMBOLECTOMY;  Surgeon: [name], MD;  Location: [facility] OR;  Service: Vascular;  Laterality: Left;  left popliteal embolectomy with drain placement  . Eye surgery  2012    removal of cataracts bilaterally  . Cardiac catheterization  7/13    non obstructive CAD  . Tonsillectomy    . Aortic valve replacement  04/12/2012    Procedure: AORTIC VALVE REPLACEMENT (AVR);  Surgeon: Delight Ovens, MD;  Location: [facility] OR;  Service: Open Heart Surgery;  Laterality: N/A;  with nitric oxide  . Thoracic aortic aneurysm repair  04/12/2012    Procedure: THORACIC ASCENDING ANEURYSM REPAIR (AAA);  Surgeon: Delight Ovens, MD;  Location: [facility] OR;  Service: Open Heart Surgery;  Laterality: N/A;  . Clot removal  5.2013    blood clot removed from the back of the left leg  . Stomach surgery      lap band placed and later removed  . Left heart catheterization with coronary angiogram N/A 04/08/2012    Procedure: LEFT HEART CATHETERIZATION WITH CORONARY ANGIOGRAM;  Surgeon: Tonny Bollman, MD;  Location: [facility] CATH LAB;  Service: Cardiovascular;  Laterality: N/A;    Current Outpatient Prescriptions  Medication Sig Dispense Refill  .  acetaminophen (TYLENOL) 500 MG tablet Take 500 mg by mouth every 6 (six) hours as needed for moderate pain.    [name] allopurinol (ZYLOPRIM) 100 MG tablet TAKE 1 TABLET (100 MG TOTAL) BY MOUTH 2 (TWO) TIMES DAILY. 180 tablet 3  . amLODipine (NORVASC) 10 MG tablet Take 1 tablet (10 mg total) by mouth daily. 90 tablet 3  . Biotin 5000 MCG CAPS Take 1 capsule (5,000 mcg total) by mouth daily. 30 capsule 6  . bisoprolol (ZEBETA) 10 MG tablet TAKE ONE TABLET BY MOUTH TWICE DAILY 60 tablet 1  . Calcium Carb-Cholecalciferol (CALCIUM-VITAMIN D) 600-400 MG-UNIT  TABS Take 1 tablet by mouth daily.    . CVS ASPIRIN 81 MG EC tablet TAKE 1 TABLET (81 MG TOTAL) BY MOUTH DAILY. 90 tablet 0  . furosemide (LASIX) 40 MG tablet Take 40 mg by mouth 2 (two) times daily.    [name] lisinopril (PRINIVIL,ZESTRIL) 10 MG tablet Take 1 tablet (10 mg total) by mouth daily. 90 tablet 3  . potassium chloride SA (KLOR-CON M20) 20 MEQ tablet Take 1 tablet (20 mEq total) by mouth 3 (three) times daily. 270 tablet 3  . rosuvastatin (CRESTOR) 5 MG tablet TAKE ONE TABLET BY MOUTH ONCE DAILY 30 tablet 10  . senna-docusate (SENOKOT S) 8.6-50 MG per tablet Take 2 tablets by mouth at bedtime. For constipation, also available OTC 60 tablet 3  . warfarin (COUMADIN) 5 MG tablet Take 1 tablet (5 mg total) by mouth as directed. 30 tablet 3   No current facility-administered medications for this visit.    Allergies  Allergen Reactions  . Adhesive [Tape] Itching, Rash and Other (See Comments)    Redness and skin breaks down  . Latex Rash  . Neomycin-Bacitracin Zn-Polymyx Rash    Review of Systems negative except from HPI and PMH  Physical Exam BP 137/80 mmHg  Pulse 51  Ht  (1.575 m)  Wt 211 lb 12.8 oz (96.072 kg)  BMI 38.73 kg/m2 Well developed and very oberse in no acute distress HENT normal E scleral and icterus clear Neck Supple JVP flat; carotids brisk and full Clear to ausculation Device pocket well healed; without hematoma or erythema.  There is no tethering  *Regular rate and rhythm, no murmurs gallops or rub Soft with active bowel sounds No clubbing cyanosis tr  Edema Alert and oriented, grossly normal motor and sensory function Skin Warm and Dry    Assessment and  Plan  Atrial fibrillation-permanent  Cardiac arrest-aborted  ICD The patient's device was interrogated.  The information was reviewed. No changes were made in the programming.    HFpEF  Hypertension   Syncope  BP Well controlled  Euvolemic continue current meds  We spent more than 45  minutes discussing her struggles with getting older, loss of control, her memory failing, the anxiety and depression that seems to be accompanying this.  I've encouraged her to pursue a neurological evaluation as well as consideration of whether depression may be part of this and may be addressed medically.   No bleeding on warfarin

## 2016-03-16 ENCOUNTER — Telehealth: Payer: Self-pay | Admitting: Family

## 2016-03-16 NOTE — Telephone Encounter (Signed)
Please contact pt to arrange follow up appointment.  

## 2016-03-16 NOTE — Telephone Encounter (Signed)
Please contact pt to arrange a follow up appointment.

## 2016-03-17 NOTE — Telephone Encounter (Signed)
LM for pt to call and schedule f/u

## 2016-04-08 ENCOUNTER — Ambulatory Visit (INDEPENDENT_AMBULATORY_CARE_PROVIDER_SITE_OTHER): Payer: Medicare Other | Admitting: *Deleted

## 2016-04-08 DIAGNOSIS — Z5181 Encounter for therapeutic drug level monitoring: Secondary | ICD-10-CM | POA: Diagnosis not present

## 2016-04-08 DIAGNOSIS — Z954 Presence of other heart-valve replacement: Secondary | ICD-10-CM | POA: Diagnosis not present

## 2016-04-08 DIAGNOSIS — Z952 Presence of prosthetic heart valve: Secondary | ICD-10-CM

## 2016-04-08 DIAGNOSIS — I4891 Unspecified atrial fibrillation: Secondary | ICD-10-CM | POA: Diagnosis not present

## 2016-04-08 DIAGNOSIS — I352 Nonrheumatic aortic (valve) stenosis with insufficiency: Secondary | ICD-10-CM | POA: Diagnosis not present

## 2016-04-08 LAB — POCT INR: INR: 2.4

## 2016-04-16 ENCOUNTER — Other Ambulatory Visit: Payer: Self-pay | Admitting: Internal Medicine

## 2016-04-22 ENCOUNTER — Other Ambulatory Visit: Payer: Self-pay | Admitting: *Deleted

## 2016-04-22 MED ORDER — FUROSEMIDE 40 MG PO TABS: 40.0000 mg | ORAL_TABLET | Freq: Every day | ORAL | 3 refills | Status: DC

## 2016-05-08 ENCOUNTER — Ambulatory Visit (INDEPENDENT_AMBULATORY_CARE_PROVIDER_SITE_OTHER): Payer: Medicare Other | Admitting: *Deleted

## 2016-05-08 DIAGNOSIS — I4891 Unspecified atrial fibrillation: Secondary | ICD-10-CM

## 2016-05-08 DIAGNOSIS — Z954 Presence of other heart-valve replacement: Secondary | ICD-10-CM | POA: Diagnosis not present

## 2016-05-08 DIAGNOSIS — Z952 Presence of prosthetic heart valve: Secondary | ICD-10-CM

## 2016-05-08 DIAGNOSIS — Z5181 Encounter for therapeutic drug level monitoring: Secondary | ICD-10-CM

## 2016-05-08 DIAGNOSIS — I352 Nonrheumatic aortic (valve) stenosis with insufficiency: Secondary | ICD-10-CM | POA: Diagnosis not present

## 2016-05-08 LAB — POCT INR: INR: 2.1

## 2016-05-13 ENCOUNTER — Other Ambulatory Visit: Payer: Self-pay | Admitting: Family

## 2016-05-13 ENCOUNTER — Other Ambulatory Visit: Payer: Self-pay

## 2016-05-13 DIAGNOSIS — N6310 Unspecified lump in the right breast, unspecified quadrant: Secondary | ICD-10-CM

## 2016-05-14 ENCOUNTER — Ambulatory Visit
Admission: RE | Admit: 2016-05-14 | Discharge: 2016-05-14 | Disposition: A | Discharge status: Home / Self Care | Payer: Medicare Other | Source: Ambulatory Visit | Attending: Family | Admitting: Family

## 2016-05-14 DIAGNOSIS — N6310 Unspecified lump in the right breast, unspecified quadrant: Secondary | ICD-10-CM

## 2016-06-13 ENCOUNTER — Encounter: Payer: Self-pay | Admitting: Family Medicine

## 2016-06-13 ENCOUNTER — Ambulatory Visit (HOSPITAL_BASED_OUTPATIENT_CLINIC_OR_DEPARTMENT_OTHER)
Admission: RE | Admit: 2016-06-13 | Discharge: 2016-06-13 | Disposition: A | Discharge status: Home / Self Care | Payer: Medicare Other | Source: Ambulatory Visit | Attending: Family Medicine | Admitting: Family Medicine

## 2016-06-13 ENCOUNTER — Ambulatory Visit (INDEPENDENT_AMBULATORY_CARE_PROVIDER_SITE_OTHER): Payer: Medicare Other | Admitting: Family Medicine

## 2016-06-13 VITALS — BP 116/78 | HR 57 | Temp 97.6°F | Ht 62.0 in | Wt 209.0 lb

## 2016-06-13 DIAGNOSIS — G453 Amaurosis fugax: Secondary | ICD-10-CM

## 2016-06-13 DIAGNOSIS — I6523 Occlusion and stenosis of bilateral carotid arteries: Secondary | ICD-10-CM | POA: Insufficient documentation

## 2016-06-13 DIAGNOSIS — Z8673 Personal history of transient ischemic attack (TIA), and cerebral infarction without residual deficits: Secondary | ICD-10-CM | POA: Insufficient documentation

## 2016-06-13 DIAGNOSIS — J323 Chronic sphenoidal sinusitis: Secondary | ICD-10-CM | POA: Insufficient documentation

## 2016-06-13 NOTE — Progress Notes (Signed)
Chief Complaint  Patient presents with  . Vision changes    yest while driving-lasted [id number]    Subjective: Patient is a 75 y.o. female here for vision changes.She is here with her granddaughter.  Yesterday, the patient was driving home from a doughnut store. After she pulled on the road, she had a bilateral loss of vision. She states it was external wall and like a shade was pulled down over her eyes. It returned after 15-20 minutes. She's not had any issues since. Denies any history of migraines or headaches. She did not have a headache when it happened and does not having a headache today. No jaw pain. This is never happened to her before. She denies any numbness, tingling, weakness, trouble swallowing, or difficulty with speech. She is not having any new balance issues. She does have a significant cardiac history, however does not have any known history of carotid artery disease. Of note, the patient's granddaughter notes that she has been more forgetful as of late.  ROS: Eyes: +vision change yesterday Head: No headaches  MSK: No jaw pain  Family History  Problem Relation Age of Onset  . Hypertension Father   . Cancer Father   . Coronary artery disease Father [id number]  . Cancer Son     pancreas, spine   Past Medical History:  Diagnosis Date  . 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 Anemia   . Anxiety   . Aortic insufficiency    s/p tissue AVR 2013  . 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 Coronary artery disease 2013   non obstructive  . DOE (dyspnea on exertion)   . DVT (deep venous thrombosis) (HCC)   . Exogenous obesity   . Heart murmur    prior AVR with asacending aortic root replacement August 2013  . History of CHF (congestive heart failure)   . History of gout   . History of ventricular tachycardia   . Hypertension   . Myocardial infarction (HCC) 2000  . Osteopenia 09/01/2013  . Peripheral edema    . Presence of IVC filter 2000  . Renal insufficiency   . Stroke [facility](HCC) 2000   Allergies  Allergen Reactions  . Adhesive [Tape] Itching, Rash and Other (See Comments)    Redness and skin breaks down  . Latex Rash  . Neomycin-Bacitracin Zn-Polymyx Rash    Current Outpatient Prescriptions:  .  acetaminophen (TYLENOL) 500 MG tablet, Take 500 mg by mouth every 6 (six) hours as needed for moderate pain., Disp: , Rfl:  .  allopurinol (ZYLOPRIM) 100 MG tablet, TAKE 1 TABLET (100 MG TOTAL) BY MOUTH 2 (TWO) TIMES DAILY., Disp: 180 tablet, Rfl: 3 .  amLODipine (NORVASC) 10 MG tablet, Take 1 tablet (10 mg total) by mouth daily., Disp: 90 tablet, Rfl: 3 .  Biotin 5000 MCG CAPS, Take 1 capsule (5,000 mcg total) by mouth daily., Disp: 30 capsule, Rfl: 6 .  bisoprolol (ZEBETA) 10 MG tablet, Take 1 tablet (10 mg total) by mouth 2 (two) times daily., Disp: 180 tablet, Rfl: 3 .  Calcium Carb-Cholecalciferol (CALCIUM-VITAMIN D) 600-400 MG-UNIT TABS, Take 1 tablet by mouth daily., Disp: , Rfl:  .  CVS ASPIRIN 81 MG EC tablet, TAKE 1 TABLET (81 MG TOTAL) BY MOUTH DAILY., Disp: 90 tablet, Rfl: 0 .  furosemide (LASIX) 40 MG tablet, Take 1 tablet (40 mg total) by mouth daily., Disp: 180 tablet, Rfl: 3 .  lisinopril (PRINIVIL,ZESTRIL) 10 MG tablet,  Take 1 tablet (10 mg total) by mouth daily., Disp: 90 tablet, Rfl: 3 .  potassium chloride SA (KLOR-CON M20) 20 MEQ tablet, Take 1 tablet (20 mEq total) by mouth 3 (three) times daily., Disp: 270 tablet, Rfl: 3 .  rosuvastatin (CRESTOR) 5 MG tablet, TAKE ONE TABLET BY MOUTH ONCE DAILY, Disp: 30 tablet, Rfl: 10 .  senna-docusate (SENOKOT S) 8.6-50 MG per tablet, Take 2 tablets by mouth at bedtime. For constipation, also available OTC, Disp: 60 tablet, Rfl: 3 .  warfarin (COUMADIN) 5 MG tablet, Take 1 tablet (5 mg total) by mouth as directed., Disp: 30 tablet, Rfl: 3  Objective: BP 116/78 (BP Location: Left Wrist, Patient Position: Sitting, Cuff Size: Large)   Pulse (!)  57   Temp 97.6 F (36.4 C) (Oral)   Ht 5\' 2"  (1.575 m)   Wt 209 lb (94.8 kg)   SpO2 98%   BMI 38.23 kg/m  General: Awake, appears stated age HEENT: MMM, EOMi, I appreciated no abnormalities on funduscopic exam. Heart: RRR, 2/6 SEM heard loudest at the tricuspid listening post. I cannot appreciate any bruits. Lungs: CTAB, no rales, wheezes or rhonchi. Normal effort Neuro: 1/4 biceps reflex bilaterally without clonus. I am unable to elicit a patellar reflex secondary to body habitus. Psych: Age appropriate judgment and insight, normal affect and mood  Assessment and Plan: Amaurosis fugax - Plan: US Carotid Duplex Bilateral, CT Head Wo Contrast  Orders as above. Presentation concerning for above. Will rule out ischemia as well as carotid artery disease. Stroke symptoms as well as signs and symptoms concerning for temporal arteritis were both verbalized an written down for the patient. She was instructed to seek emergent medical care should any of these arise. Follow-up pending the above workup. The patient and her granddaughter voiced understanding and agreement to the plan.  Jilda Roche Carney, DO 06/13/16  10:23 AM

## 2016-06-13 NOTE — Patient Instructions (Addendum)
If you start having one sided numbness, tingling, difficulty with speech or swallowing, or weakness, go to ER.   Also, go to ER or let us know immediately if you start having a headache or difficulty chewing.

## 2016-06-18 ENCOUNTER — Ambulatory Visit (INDEPENDENT_AMBULATORY_CARE_PROVIDER_SITE_OTHER): Payer: Medicare Other | Admitting: *Deleted

## 2016-06-18 DIAGNOSIS — Z952 Presence of prosthetic heart valve: Secondary | ICD-10-CM

## 2016-06-18 DIAGNOSIS — Z5181 Encounter for therapeutic drug level monitoring: Secondary | ICD-10-CM

## 2016-06-18 DIAGNOSIS — Z954 Presence of other heart-valve replacement: Secondary | ICD-10-CM

## 2016-06-18 DIAGNOSIS — I352 Nonrheumatic aortic (valve) stenosis with insufficiency: Secondary | ICD-10-CM

## 2016-06-18 DIAGNOSIS — I4891 Unspecified atrial fibrillation: Secondary | ICD-10-CM

## 2016-06-18 LAB — POCT INR: INR: 3.5

## 2016-07-02 ENCOUNTER — Ambulatory Visit (INDEPENDENT_AMBULATORY_CARE_PROVIDER_SITE_OTHER): Payer: Medicare Other | Admitting: *Deleted

## 2016-07-02 DIAGNOSIS — I352 Nonrheumatic aortic (valve) stenosis with insufficiency: Secondary | ICD-10-CM | POA: Diagnosis not present

## 2016-07-02 DIAGNOSIS — Z952 Presence of prosthetic heart valve: Secondary | ICD-10-CM | POA: Diagnosis not present

## 2016-07-02 DIAGNOSIS — I4891 Unspecified atrial fibrillation: Secondary | ICD-10-CM | POA: Diagnosis not present

## 2016-07-02 DIAGNOSIS — Z5181 Encounter for therapeutic drug level monitoring: Secondary | ICD-10-CM | POA: Diagnosis not present

## 2016-07-02 LAB — POCT INR: INR: 2.7

## 2016-07-03 ENCOUNTER — Other Ambulatory Visit: Payer: Self-pay

## 2016-07-03 MED ORDER — LISINOPRIL 10 MG PO TABS: 10.0000 mg | ORAL_TABLET | Freq: Every day | ORAL | 3 refills | Status: DC

## 2016-07-08 ENCOUNTER — Telehealth: Payer: Self-pay | Admitting: Family

## 2016-07-08 ENCOUNTER — Encounter: Payer: Self-pay | Admitting: Family

## 2016-07-08 ENCOUNTER — Telehealth: Payer: Self-pay

## 2016-07-08 ENCOUNTER — Ambulatory Visit (INDEPENDENT_AMBULATORY_CARE_PROVIDER_SITE_OTHER): Payer: Medicare Other | Admitting: Family

## 2016-07-08 ENCOUNTER — Ambulatory Visit (HOSPITAL_BASED_OUTPATIENT_CLINIC_OR_DEPARTMENT_OTHER)
Admission: RE | Admit: 2016-07-08 | Discharge: 2016-07-08 | Disposition: A | Discharge status: Home / Self Care | Payer: Medicare Other | Source: Ambulatory Visit | Attending: Family | Admitting: Family

## 2016-07-08 VITALS — BP 131/86 | HR 97 | Temp 97.7°F | Ht 62.0 in | Wt 210.0 lb

## 2016-07-08 DIAGNOSIS — S8261XA Displaced fracture of lateral malleolus of right fibula, initial encounter for closed fracture: Secondary | ICD-10-CM | POA: Diagnosis not present

## 2016-07-08 DIAGNOSIS — S82831A Other fracture of upper and lower end of right fibula, initial encounter for closed fracture: Secondary | ICD-10-CM

## 2016-07-08 DIAGNOSIS — W19XXXA Unspecified fall, initial encounter: Secondary | ICD-10-CM | POA: Diagnosis not present

## 2016-07-08 DIAGNOSIS — M25561 Pain in right knee: Secondary | ICD-10-CM | POA: Diagnosis not present

## 2016-07-08 DIAGNOSIS — S92354A Nondisplaced fracture of fifth metatarsal bone, right foot, initial encounter for closed fracture: Secondary | ICD-10-CM

## 2016-07-08 DIAGNOSIS — I4891 Unspecified atrial fibrillation: Secondary | ICD-10-CM

## 2016-07-08 DIAGNOSIS — I5043 Acute on chronic combined systolic (congestive) and diastolic (congestive) heart failure: Secondary | ICD-10-CM | POA: Diagnosis not present

## 2016-07-08 DIAGNOSIS — I509 Heart failure, unspecified: Secondary | ICD-10-CM

## 2016-07-08 DIAGNOSIS — R609 Edema, unspecified: Secondary | ICD-10-CM | POA: Insufficient documentation

## 2016-07-08 DIAGNOSIS — T148XXA Other injury of unspecified body region, initial encounter: Secondary | ICD-10-CM

## 2016-07-08 DIAGNOSIS — M79671 Pain in right foot: Secondary | ICD-10-CM | POA: Diagnosis present

## 2016-07-08 DIAGNOSIS — R5383 Other fatigue: Secondary | ICD-10-CM

## 2016-07-08 LAB — CBC WITH DIFFERENTIAL/PLATELET
BASOS ABS: 0 10*3/uL (ref 0.0–0.1)
Basophils Relative: 0.4 % (ref 0.0–3.0)
EOS ABS: 0.4 10*3/uL (ref 0.0–0.7)
Eosinophils Relative: 4.7 % (ref 0.0–5.0)
HCT: 36.7 % (ref 36.0–46.0)
Hemoglobin: 12 g/dL (ref 12.0–15.0)
LYMPHS ABS: 1.5 10*3/uL (ref 0.7–4.0)
Lymphocytes Relative: 19 % (ref 12.0–46.0)
MCHC: 32.7 g/dL (ref 30.0–36.0)
MCV: 93.9 fl (ref 78.0–100.0)
MONO ABS: 0.9 10*3/uL (ref 0.1–1.0)
Monocytes Relative: 11.2 % (ref 3.0–12.0)
NEUTROS ABS: 5 10*3/uL (ref 1.4–7.7)
NEUTROS PCT: 64.7 % (ref 43.0–77.0)
PLATELETS: 197 10*3/uL (ref 150.0–400.0)
RBC: 3.91 Mil/uL (ref 3.87–5.11)
RDW: 14.6 % (ref 11.5–15.5)
WBC: 7.8 10*3/uL (ref 4.0–10.5)

## 2016-07-08 LAB — BASIC METABOLIC PANEL
BUN: 30 mg/dL — ABNORMAL HIGH (ref 6–23)
CALCIUM: 9.5 mg/dL (ref 8.4–10.5)
CO2: 30 meq/L (ref 19–32)
CREATININE: 1.41 mg/dL — AB (ref 0.40–1.20)
Chloride: 104 mEq/L (ref 96–112)
GFR: 38.7 mL/min — AB (ref >60.00)
GLUCOSE: 91 mg/dL (ref 70–99)
Potassium: 4 mEq/L (ref 3.5–5.1)
Sodium: 144 mEq/L (ref 135–145)

## 2016-07-08 LAB — HEPATIC FUNCTION PANEL
ALBUMIN: 3.8 g/dL (ref 3.5–5.2)
ALK PHOS: 68 U/L (ref 39–117)
ALT: 12 U/L (ref 0–35)
AST: 18 U/L (ref 0–37)
BILIRUBIN DIRECT: 0 mg/dL (ref 0.0–0.3)
BILIRUBIN TOTAL: 0.4 mg/dL (ref 0.2–1.2)
Total Protein: 7.3 g/dL (ref 6.0–8.3)

## 2016-07-08 LAB — TSH: TSH: 1.34 u[IU]/mL (ref 0.35–4.50)

## 2016-07-08 LAB — BRAIN NATRIURETIC PEPTIDE: Pro B Natriuretic peptide (BNP): 275 pg/mL — ABNORMAL HIGH (ref 0.0–100.0)

## 2016-07-08 NOTE — Progress Notes (Signed)
Pre visit review using our clinic review tool, if applicable. No additional management support is needed unless otherwise documented below in the visit note. 

## 2016-07-08 NOTE — Telephone Encounter (Signed)
Notified pt's grand daughter. She states pt has appt with ortho tomorrow. Advised her I would leave Rx at front desk for bedside commode and they could take it to [facility] or [facility]. She states they will pick up Rx if they are unable to find bedside commode in storage that pt's son had in the past. Rx placed at front desk.

## 2016-07-08 NOTE — Progress Notes (Signed)
Subjective:    Patient ID: [name], female    DOB: [birth year], 75 y.o.   MRN: [id number]  HPI  [name] is a 75 yr old female who presents today following a fall.  Pt stood up from the couch,  Believes she tripped on her oxygen tubing.  She landed on her right elbow/knee and ankle.  She reports increased pain/swelling of the right leg.  She did have a recent trip to the beach.  She is more sob than usual.  She continues lasix 40mg  daily. Denies chest pain.  She always sleeps upright on a hospital bed and continues to sleep in the upright position. Denies dysuria.  She has previous hx of DVT and is maintained on coumadin.   Today she reports pain in her right ankle and knee.  She is unable to bear weight on her right ankle. She was unable to be weighed today.    Review of Systems See HPI  Past Medical History:  Diagnosis Date  . 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 Anemia   . Anxiety   . Aortic insufficiency    s/p tissue AVR 2013  . 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 Coronary artery disease 2013   non obstructive  . DOE (dyspnea on exertion)   . DVT (deep venous thrombosis) (HCC)   . Exogenous obesity   . Heart murmur    prior AVR with asacending aortic root replacement August 2013  . History of CHF (congestive heart failure)   . History of gout   . History of ventricular tachycardia   . Hypertension   . Myocardial infarction 2000  . Osteopenia 09/01/2013  . Peripheral edema   . Presence of IVC filter 2000  . Renal insufficiency   . Stroke [facility]) 2000     Social History   Social History  . Marital status: Divorced    Spouse name: N/A  . Number of children: N/A  . Years of education: N/A   Occupational History  . Not on file.   Social History [name] Topics  . Smoking status: Never Smoker  . Smokeless tobacco: Never Used  . Alcohol use No  . Drug use: No  . Sexual activity: No    Other Topics Concern  . Not on file   Social History Narrative   Lives with daughter.      Past Surgical History:  Procedure Laterality Date  . AORTIC VALVE REPLACEMENT  04/12/2012   Procedure: AORTIC VALVE REPLACEMENT (AVR);  Surgeon: Delight Ovens, MD;  Location: [facility] OR;  Service: Open Heart Surgery;  Laterality: N/A;  with nitric oxide  . CARDIAC CATHETERIZATION  7/13   non obstructive CAD  . CARDIAC DEFIBRILLATOR PLACEMENT  2013   MDT ICD  . CATARACT EXTRACTION    . clot removal  5.2013   blood clot removed from the back of the left leg  . EMBOLECTOMY  02/15/2012   Procedure: EMBOLECTOMY;  Surgeon: [name], MD;  Location: [facility] OR;  Service: Vascular;  Laterality: Left;  left popliteal embolectomy with drain placement  . EYE SURGERY  2012   removal of cataracts bilaterally  . LEFT HEART CATHETERIZATION WITH CORONARY ANGIOGRAM N/A 04/08/2012   Procedure: LEFT HEART CATHETERIZATION WITH CORONARY ANGIOGRAM;  Surgeon: Tonny Bollman, MD;  Location: [facility] CATH LAB;  Service: Cardiovascular;  Laterality: N/A;  . STOMACH SURGERY     lap band placed and later  removed  . THORACIC AORTIC ANEURYSM REPAIR  04/12/2012   Procedure: THORACIC ASCENDING ANEURYSM REPAIR (AAA);  Surgeon: Delight Ovens, MD;  Location: [facility] OR;  Service: Open Heart Surgery;  Laterality: N/A;  . TONSILLECTOMY      Family History  Problem Relation Age of Onset  . Hypertension Father   . Cancer Father   . Coronary artery disease Father 38  . Cancer Son     pancreas, spine    Allergies  Allergen Reactions  . Adhesive [Tape] Itching, Rash and Other (See Comments)    Redness and skin breaks down  . Latex Rash  . Neomycin-Bacitracin Zn-Polymyx Rash    Current Outpatient Prescriptions on File Prior to Visit  Medication Sig Dispense Refill  . acetaminophen (TYLENOL) 500 MG tablet Take 500 mg by mouth every 6 (six) hours as needed for moderate pain.    [name] allopurinol (ZYLOPRIM) 100 MG tablet TAKE  1 TABLET (100 MG TOTAL) BY MOUTH 2 (TWO) TIMES DAILY. 180 tablet 3  . amLODipine (NORVASC) 10 MG tablet Take 1 tablet (10 mg total) by mouth daily. 90 tablet 3  . Biotin 5000 MCG CAPS Take 1 capsule (5,000 mcg total) by mouth daily. 30 capsule 6  . bisoprolol (ZEBETA) 10 MG tablet Take 1 tablet (10 mg total) by mouth 2 (two) times daily. 180 tablet 3  . CVS ASPIRIN 81 MG EC tablet TAKE 1 TABLET (81 MG TOTAL) BY MOUTH DAILY. 90 tablet 0  . furosemide (LASIX) 40 MG tablet Take 1 tablet (40 mg total) by mouth daily. 180 tablet 3  . lisinopril (PRINIVIL,ZESTRIL) 10 MG tablet Take 1 tablet (10 mg total) by mouth daily. 90 tablet 3  . potassium chloride SA (KLOR-CON M20) 20 MEQ tablet Take 1 tablet (20 mEq total) by mouth 3 (three) times daily. 270 tablet 3  . rosuvastatin (CRESTOR) 5 MG tablet TAKE ONE TABLET BY MOUTH ONCE DAILY 30 tablet 10  . senna-docusate (SENOKOT S) 8.6-50 MG per tablet Take 2 tablets by mouth at bedtime. For constipation, also available OTC 60 tablet 3  . warfarin (COUMADIN) 5 MG tablet Take 1 tablet (5 mg total) by mouth as directed. 30 tablet 3  . Calcium Carb-Cholecalciferol (CALCIUM-VITAMIN D) 600-400 MG-UNIT TABS Take 1 tablet by mouth daily.     No current facility-administered medications on file prior to visit.     BP 131/86 (BP Location: Left Arm, Patient Position: Sitting, Cuff Size: Large)   Pulse 97   Temp 97.7 F (36.5 C) (Oral)   Ht 5\' 2"  (1.575 m)   Wt 210 lb (95.3 kg)   SpO2 99%   BMI 38.41 kg/m       Objective:   Physical Exam  Constitutional: She is oriented to person, place, and time. She appears well-developed and well-nourished.  HENT:  Head: Normocephalic and atraumatic.  Cardiovascular: Normal rate and regular rhythm.   Murmur heard. Pulmonary/Chest: Effort normal and breath sounds normal. No respiratory distress. She has no wheezes.  Musculoskeletal:  3-4+ RLE swelling, 3+ LLE swelling  Neurological: She is alert and oriented to person,  place, and time.  Skin: Skin is warm and dry.  Psychiatric: She has a normal mood and affect. Her behavior is normal. Judgment and thought content normal.          Assessment & Plan:  Acute on Chronic CHF- will increase lasix to 40mg  bid x 3 days.  Obtain BNP. CBC, bmet. Check TSH due to c/o poor energy. Spoke  with Dr. Graciela HusbandsKlein re: patient case and abnormal EKG which was personally reviewed by myself.  Notes TWI in V5 and V6, Atrial fibrillation.  He agreed with increasing lasix dosing.    LE edema- likely secondary to CHF, but she has previous hx of DVT and has had recent car travel. LE doppler negative for DVT.  R 5th metatarsal fracture/right distal fibula fracture- refer to orthopedics.

## 2016-07-08 NOTE — Telephone Encounter (Signed)
I have placed a home health referral. RN can come assess her and do a home safety assessment.  I do not believe that her insurance will pay for a home health aid to help with bathing etc. This may be a an out of pocket expense for the patient unfortunately.

## 2016-07-08 NOTE — Telephone Encounter (Addendum)
Caller name: Bonita Quin Relation to LO:VFIEP daughter  Call back number: [phone]    Reason for call:   Grandaughter inquiring about patient receiving home health assistance between  8 to 5 assisting patient with all ADL needs such as showering, transferring etc. In need of clinical advice

## 2016-07-08 NOTE — Telephone Encounter (Signed)
Upon checking out patient family questioned if we could have a home health nurse come out and place a catheter. They are concerned that patient will not make it to the bathroom in time so they feel she would need one. Pt seemed unpleasant with the idea and stated that she did not want one because they would have to come back into the house to take the catheter out. I am forwarding this message to you so you can place your recommendations while reviewing results from the EKG and X-Ray. Please advise.

## 2016-07-08 NOTE — Telephone Encounter (Signed)
Please contact patient and let her know that x rays show fracture at the base of her right fibula and right small toe. I will work to try to get her in with ortho today.  In regards to foley catheter request, I would recommend instead a bedside commode.  There is increased risk of infection with catheter so I would prefer to avoid it.  Ultrasound negative for DVT.  Lab work looks ok, mild elevation of the heart failure test. Advise pt to increase lasix as we discussed at her appointment.

## 2016-07-08 NOTE — Patient Instructions (Signed)
Complete x rays/ultrasounds on the first floor.  Complete lab work prior to leaving.  Increase lasix to 40mg  twice daily for 3 days, then return to once daily.

## 2016-07-08 NOTE — Telephone Encounter (Signed)
Noted, see additional phone note.

## 2016-07-09 NOTE — Telephone Encounter (Signed)
Notified pt's grand daughter and she voices understanding.

## 2016-07-11 ENCOUNTER — Telehealth: Payer: Self-pay | Admitting: Family

## 2016-07-11 NOTE — Telephone Encounter (Signed)
Caron Presume a RN with [facility] is calling to requests orders for skilled nursing for 1w1 and 2w8. Also two PRNS for medication management and shortness of breath. Rinaldo Cloud states that the patient needs a Child psychotherapist, physical therapist and occupational therapist. Rinaldo Cloud would like Melissa to know that the patient had a fall and fractured her right ankle. Rinaldo Cloud would also like orders for her labs because there was an increase in the patient's lasix.  Please advise.   Rinaldo Cloud Brady's Contact: [phone]

## 2016-07-11 NOTE — Telephone Encounter (Signed)
We will draw her labs at her 1 week follow up. OK to give verbal order for labs as below and ask that they set these services up as soon as possible please.

## 2016-07-11 NOTE — Telephone Encounter (Signed)
Orders given to Rinaldo Cloud at [phone] per below request. Notified pt's granddaughter. They are concerned about pt's care coming up next week as they are not able to stay with pt and home health nurse told them pt should not be alone during the day. Family states they cannot pay out of pocket for a sitter for 8 hrs a day and want to get pt into a rehab facility. Advised family per verbal from PCP that insurance will not cover a rehab facility unless pt has been in the hospital for 3 days as a result of her current condition. Rinaldo Cloud states she is having Child psychotherapist also do evaluation to see what can be worked out for the pt regarding this safety issue / situation. Notified [name] (pt's granddaughter) and scheduled pt's 1 week follow up for 07/15/16 at 8:45am.

## 2016-07-11 NOTE — Telephone Encounter (Signed)
[name] (daughter) is calling to see if there can possibly be a rush put on these orders. She states that there is no one to stay with the patient next week.

## 2016-07-15 ENCOUNTER — Ambulatory Visit (INDEPENDENT_AMBULATORY_CARE_PROVIDER_SITE_OTHER): Payer: Medicare Other | Admitting: Family

## 2016-07-15 ENCOUNTER — Telehealth: Payer: Self-pay | Admitting: Family

## 2016-07-15 ENCOUNTER — Encounter: Payer: Self-pay | Admitting: Family

## 2016-07-15 VITALS — BP 110/80 | HR 58 | Temp 97.8°F | Resp 16 | Ht 62.0 in

## 2016-07-15 DIAGNOSIS — S82831D Other fracture of upper and lower end of right fibula, subsequent encounter for closed fracture with routine healing: Secondary | ICD-10-CM | POA: Diagnosis not present

## 2016-07-15 DIAGNOSIS — S92354A Nondisplaced fracture of fifth metatarsal bone, right foot, initial encounter for closed fracture: Secondary | ICD-10-CM

## 2016-07-15 DIAGNOSIS — I5043 Acute on chronic combined systolic (congestive) and diastolic (congestive) heart failure: Secondary | ICD-10-CM

## 2016-07-15 DIAGNOSIS — I509 Heart failure, unspecified: Secondary | ICD-10-CM

## 2016-07-15 LAB — BASIC METABOLIC PANEL
BUN: 45 mg/dL — AB (ref 6–23)
CALCIUM: 10.1 mg/dL (ref 8.4–10.5)
CO2: 29 meq/L (ref 19–32)
Chloride: 102 mEq/L (ref 96–112)
Creatinine, Ser: 1.88 mg/dL — ABNORMAL HIGH (ref 0.40–1.20)
GFR: 27.77 mL/min — ABNORMAL LOW (ref >60.00)
GLUCOSE: 94 mg/dL (ref 70–99)
POTASSIUM: 5.4 meq/L — AB (ref 3.5–5.1)
SODIUM: 141 meq/L (ref 135–145)

## 2016-07-15 MED ORDER — POTASSIUM CHLORIDE CRYS ER 20 MEQ PO TBCR: 20.0000 meq | EXTENDED_RELEASE_TABLET | Freq: Two times a day (BID) | ORAL | 3 refills | Status: DC

## 2016-07-15 NOTE — Telephone Encounter (Signed)
Notified pt and she voices understanding. Has been taking 3 times a day. Lab appt scheduled for 07/18/16 at 3pm.  Please advise if pt should remain off potassium on Thursday as well?

## 2016-07-15 NOTE — Assessment & Plan Note (Signed)
Improved volume status today. Advised pt to continue lasix once daily. Will repeat bmet today. Call if increased swelling/sob. Advised pt to follow up in 3 months.

## 2016-07-15 NOTE — Patient Instructions (Signed)
Please complete lab work prior to leaving. You may continue furosemide once daily.  Call if you develop shortness of breath or swelling. Follow up as scheduled with orthopedics.

## 2016-07-15 NOTE — Progress Notes (Signed)
Pre visit review using our clinic review tool, if applicable. No additional management support is needed unless otherwise documented below in the visit note. 

## 2016-07-15 NOTE — Progress Notes (Signed)
Subjective:    Patient ID: [name], female    DOB: July 19, 1941, 75 y.o.   MRN: [id number]  HPI  [name] is a 75 yr old female who presents today for follow up.  1) CHF- last visit she was noted to be volume overloaded.  Her lasix was increased to bid x 3 days.  She reports that she has returned to once daily dosing and is breathing better.  Swelling has improved significantly.   Wt Readings from Last 3 Encounters:  07/08/16 210 lb (95.3 kg)  06/13/16 209 lb (94.8 kg)  03/13/16 211 lb 12.8 oz (96.1 kg)   2) R 5th metatarsal fracture/right distal fibula fracture-  Patient was referred to orthopedics.  Now in a CAM boot and is weight bearing and walking short distances.   Review of Systems See HPI  Past Medical History:  Diagnosis Date  . A-fib [facility])    Not on coumadin due to intracebral hemorrhage  . Anemia   . Anxiety   . Aortic insufficiency    s/p tissue AVR 2013  . 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 Coronary artery disease 2013   non obstructive  . DOE (dyspnea on exertion)   . DVT (deep venous thrombosis) (HCC)   . Exogenous obesity   . Heart murmur    prior AVR with asacending aortic root replacement August 2013  . History of CHF (congestive heart failure)   . History of gout   . History of ventricular tachycardia   . Hypertension   . Myocardial infarction 2000  . Osteopenia 09/01/2013  . Peripheral edema   . Presence of IVC filter 2000  . Renal insufficiency   . Stroke [facility]) 2000     Social History   Social History  . Marital status: Divorced    Spouse name: N/A  . Number of children: N/A  . Years of education: N/A   Occupational History  . Not on file.   Social History Main Topics  . Smoking status: Never Smoker  . Smokeless tobacco: Never Used  . Alcohol use No  . Drug use: No  . Sexual activity: No   Other Topics Concern  . Not on file   Social History Narrative   Lives with  daughter.      Past Surgical History:  Procedure Laterality Date  . AORTIC VALVE REPLACEMENT  04/12/2012   Procedure: AORTIC VALVE REPLACEMENT (AVR);  Surgeon: Delight Ovens, MD;  Location: [facility] OR;  Service: Open Heart Surgery;  Laterality: N/A;  with nitric oxide  . CARDIAC CATHETERIZATION  7/13   non obstructive CAD  . CARDIAC DEFIBRILLATOR PLACEMENT  2013   MDT ICD  . CATARACT EXTRACTION    . clot removal  5.2013   blood clot removed from the back of the left leg  . EMBOLECTOMY  02/15/2012   Procedure: EMBOLECTOMY;  Surgeon: [name], MD;  Location: [facility] OR;  Service: Vascular;  Laterality: Left;  left popliteal embolectomy with drain placement  . EYE SURGERY  2012   removal of cataracts bilaterally  . LEFT HEART CATHETERIZATION WITH CORONARY ANGIOGRAM N/A 04/08/2012   Procedure: LEFT HEART CATHETERIZATION WITH CORONARY ANGIOGRAM;  Surgeon: Tonny Bollman, MD;  Location: [facility] CATH LAB;  Service: Cardiovascular;  Laterality: N/A;  . STOMACH SURGERY     lap band placed and later removed  . THORACIC AORTIC ANEURYSM REPAIR  04/12/2012   Procedure: THORACIC ASCENDING ANEURYSM REPAIR (AAA);  Surgeon: Gwenith Daily  Tyrone SageGerhardt, MD;  Location: [facility] OR;  Service: Open Heart Surgery;  Laterality: N/A;  . TONSILLECTOMY      Family History  Problem Relation Age of Onset  . Hypertension Father   . Cancer Father   . Coronary artery disease Father [id number]  . Cancer Son     pancreas, spine    Allergies  Allergen Reactions  . Adhesive [Tape] Itching, Rash and Other (See Comments)    Redness and skin breaks down  . Latex Rash  . Neomycin-Bacitracin Zn-Polymyx Rash    Current Outpatient Prescriptions on File Prior to Visit  Medication Sig Dispense Refill  . acetaminophen (TYLENOL) 500 MG tablet Take 500 mg by mouth every 6 (six) hours as needed for moderate pain.    [name]. allopurinol (ZYLOPRIM) 100 MG tablet TAKE 1 TABLET (100 MG TOTAL) BY MOUTH 2 (TWO) TIMES DAILY. 180 tablet 3  . amLODipine  (NORVASC) 10 MG tablet Take 1 tablet (10 mg total) by mouth daily. 90 tablet 3  . Biotin 5000 MCG CAPS Take 1 capsule (5,000 mcg total) by mouth daily. 30 capsule 6  . bisoprolol (ZEBETA) 10 MG tablet Take 1 tablet (10 mg total) by mouth 2 (two) times daily. 180 tablet 3  . Calcium Carb-Cholecalciferol (CALCIUM-VITAMIN D) 600-400 MG-UNIT TABS Take 1 tablet by mouth daily.    . [employer] ASPIRIN 81 MG EC tablet TAKE 1 TABLET (81 MG TOTAL) BY MOUTH DAILY. 90 tablet 0  . furosemide (LASIX) 40 MG tablet Take 1 tablet (40 mg total) by mouth daily. 180 tablet 3  . lisinopril (PRINIVIL,ZESTRIL) 10 MG tablet Take 1 tablet (10 mg total) by mouth daily. 90 tablet 3  . potassium chloride SA (KLOR-CON M20) 20 MEQ tablet Take 1 tablet (20 mEq total) by mouth 3 (three) times daily. 270 tablet 3  . rosuvastatin (CRESTOR) 5 MG tablet TAKE ONE TABLET BY MOUTH ONCE DAILY 30 tablet 10  . senna-docusate (SENOKOT S) 8.6-50 MG per tablet Take 2 tablets by mouth at bedtime. For constipation, also available OTC 60 tablet 3  . warfarin (COUMADIN) 5 MG tablet Take 1 tablet (5 mg total) by mouth as directed. 30 tablet 3   No current facility-administered medications on file prior to visit.     BP 110/80 (BP Location: Left Arm, Patient Position: Sitting, Cuff Size: Normal)   Pulse (!) 58   Temp 97.8 F (36.6 C) (Oral)   Resp 16   Ht 5\' 2"  (1.575 m)   SpO2 100% Comment: 2L/ O2 via nasal cannula      Objective:   Physical Exam  Constitutional: She appears well-developed and well-nourished.  Cardiovascular: Normal rate, regular rhythm and normal heart sounds.   No murmur heard. Pulmonary/Chest: Effort normal and breath sounds normal. No respiratory distress. She has no wheezes.  Musculoskeletal:  LLE without edema. RLE in walking boot.   Psychiatric: She has a normal mood and affect. Her behavior is normal. Judgment and thought content normal.          Assessment & Plan:  R 5th metatarsal fracture/right distal  fibula fracture- in CAM boot, clinically improving, management per [place] [facility].

## 2016-07-15 NOTE — Telephone Encounter (Signed)
Please let pt know that her potassium is high.  It looks like she has been taking kdur TID, is that correct?  I would like her to not take any further kdur today or tomorrow. Repeat BMET on Friday and we will give her further instructions.

## 2016-07-18 ENCOUNTER — Telehealth: Payer: Self-pay | Admitting: Family

## 2016-07-18 ENCOUNTER — Other Ambulatory Visit (INDEPENDENT_AMBULATORY_CARE_PROVIDER_SITE_OTHER): Payer: Medicare Other

## 2016-07-18 DIAGNOSIS — R350 Frequency of micturition: Secondary | ICD-10-CM | POA: Diagnosis not present

## 2016-07-18 DIAGNOSIS — E875 Hyperkalemia: Secondary | ICD-10-CM

## 2016-07-18 LAB — BASIC METABOLIC PANEL
BUN: 28 mg/dL — ABNORMAL HIGH (ref 7–25)
CHLORIDE: 101 mmol/L (ref 98–110)
CO2: 28 mmol/L (ref 20–31)
CREATININE: 1.6 mg/dL — AB (ref 0.60–0.93)
Calcium: 9.1 mg/dL (ref 8.6–10.4)
Glucose, Bld: 102 mg/dL — ABNORMAL HIGH (ref 65–99)
POTASSIUM: 4.6 mmol/L (ref 3.5–5.3)
Sodium: 143 mmol/L (ref 135–146)

## 2016-07-18 LAB — POCT URINALYSIS DIPSTICK
Bilirubin, UA: NEGATIVE
Glucose, UA: NEGATIVE
Ketones, UA: NEGATIVE
NITRITE UA: NEGATIVE
PH UA: 7
Spec Grav, UA: 1.015
UROBILINOGEN UA: 0.2

## 2016-07-18 NOTE — Telephone Encounter (Signed)
Per provider, patient is coming for lab only, add lab work to existing lab work for today, also check patient's blood pressure to assure patient b/p is stable. Spoke with the patient's granddaughter [name], patient's granddaughter states she understand the lab appointment instructions. Patient's granddaughter had no further questions or concerns.

## 2016-07-18 NOTE — Telephone Encounter (Signed)
Caller name: [name]  Relation to pt: granddaughter Call back number: [phone] Pharmacy:  Reason for call: Pt's granddaughter stated pt possibly has UTI and since pt is coming in for labs today in the afternoon, Pt granddaughter would like to know if possible to have urine test done at lab also. Please advise.

## 2016-07-19 ENCOUNTER — Telehealth: Payer: Self-pay | Admitting: Family

## 2016-07-19 MED ORDER — CEPHALEXIN 500 MG PO CAPS: 500.0000 mg | ORAL_CAPSULE | Freq: Two times a day (BID) | ORAL | 0 refills | Status: AC

## 2016-07-19 NOTE — Telephone Encounter (Signed)
UA shows possible UTI, will initiate keflex.  I spoke with granddaughter and advised her of above. She states that her grandmother is interested inn memory testing/dementia work up.  She will call back Monday to schedule an appointment.

## 2016-07-19 NOTE — Telephone Encounter (Signed)
I wanted to notify you that I am starting [name] on Keflex for a UTI. She is followed in coumadin clinic. Thank you.

## 2016-07-20 LAB — URINE CULTURE

## 2016-07-21 ENCOUNTER — Telehealth: Payer: Self-pay | Admitting: Family

## 2016-07-21 NOTE — Telephone Encounter (Signed)
Noted an agree

## 2016-07-21 NOTE — Telephone Encounter (Signed)
Notified Byrd Hesselbach ok to proceed with below orders.

## 2016-07-21 NOTE — Telephone Encounter (Signed)
[facility] health [phone]  She is requesting PT orders to work with pt 1 time a week for 1 week and Twice a week for 7 weeks.

## 2016-07-22 NOTE — Telephone Encounter (Signed)
Spoke with pt's granddaughter who states may speak with her regarding her grandmother's medication  Instructed that there is no interaction between Couamdin and Keflex and to take her coumadin as ordered and her Keflex as ordered and to keep her scheduled appt in the coumadin clinic and she states understanding

## 2016-07-25 ENCOUNTER — Emergency Department (HOSPITAL_BASED_OUTPATIENT_CLINIC_OR_DEPARTMENT_OTHER): Payer: Medicare Other

## 2016-07-25 ENCOUNTER — Encounter (HOSPITAL_BASED_OUTPATIENT_CLINIC_OR_DEPARTMENT_OTHER): Payer: Self-pay

## 2016-07-25 ENCOUNTER — Inpatient Hospital Stay (HOSPITAL_BASED_OUTPATIENT_CLINIC_OR_DEPARTMENT_OTHER)
Admission: EM | Admit: 2016-07-25 | Discharge: 2016-07-28 | DRG: 813 | Disposition: A | Discharge status: Skilled Nursing Facility | Payer: Medicare Other | Attending: Family Medicine | Admitting: Family Medicine

## 2016-07-25 ENCOUNTER — Telehealth: Payer: Self-pay | Admitting: Family

## 2016-07-25 DIAGNOSIS — I130 Hypertensive heart and chronic kidney disease with heart failure and stage 1 through stage 4 chronic kidney disease, or unspecified chronic kidney disease: Secondary | ICD-10-CM | POA: Diagnosis present

## 2016-07-25 DIAGNOSIS — J9611 Chronic respiratory failure with hypoxia: Secondary | ICD-10-CM | POA: Diagnosis present

## 2016-07-25 DIAGNOSIS — I10 Essential (primary) hypertension: Secondary | ICD-10-CM | POA: Diagnosis present

## 2016-07-25 DIAGNOSIS — F0150 Vascular dementia without behavioral disturbance: Secondary | ICD-10-CM | POA: Diagnosis present

## 2016-07-25 DIAGNOSIS — S301XXA Contusion of abdominal wall, initial encounter: Secondary | ICD-10-CM | POA: Diagnosis present

## 2016-07-25 DIAGNOSIS — I5022 Chronic systolic (congestive) heart failure: Secondary | ICD-10-CM | POA: Diagnosis present

## 2016-07-25 DIAGNOSIS — Z7901 Long term (current) use of anticoagulants: Secondary | ICD-10-CM | POA: Diagnosis not present

## 2016-07-25 DIAGNOSIS — D6832 Hemorrhagic disorder due to extrinsic circulating anticoagulants: Secondary | ICD-10-CM | POA: Diagnosis present

## 2016-07-25 DIAGNOSIS — N183 Chronic kidney disease, stage 3 unspecified: Secondary | ICD-10-CM | POA: Diagnosis present

## 2016-07-25 DIAGNOSIS — E785 Hyperlipidemia, unspecified: Secondary | ICD-10-CM | POA: Diagnosis present

## 2016-07-25 DIAGNOSIS — I69311 Memory deficit following cerebral infarction: Secondary | ICD-10-CM | POA: Diagnosis not present

## 2016-07-25 DIAGNOSIS — M109 Gout, unspecified: Secondary | ICD-10-CM | POA: Diagnosis present

## 2016-07-25 DIAGNOSIS — Z95810 Presence of automatic (implantable) cardiac defibrillator: Secondary | ICD-10-CM

## 2016-07-25 DIAGNOSIS — N179 Acute kidney failure, unspecified: Secondary | ICD-10-CM | POA: Diagnosis present

## 2016-07-25 DIAGNOSIS — Z86718 Personal history of other venous thrombosis and embolism: Secondary | ICD-10-CM

## 2016-07-25 DIAGNOSIS — I429 Cardiomyopathy, unspecified: Secondary | ICD-10-CM | POA: Diagnosis present

## 2016-07-25 DIAGNOSIS — I2510 Atherosclerotic heart disease of native coronary artery without angina pectoris: Secondary | ICD-10-CM | POA: Diagnosis present

## 2016-07-25 DIAGNOSIS — Z7982 Long term (current) use of aspirin: Secondary | ICD-10-CM

## 2016-07-25 DIAGNOSIS — D62 Acute posthemorrhagic anemia: Secondary | ICD-10-CM | POA: Diagnosis present

## 2016-07-25 DIAGNOSIS — Z9981 Dependence on supplemental oxygen: Secondary | ICD-10-CM

## 2016-07-25 DIAGNOSIS — I482 Chronic atrial fibrillation, unspecified: Secondary | ICD-10-CM | POA: Diagnosis present

## 2016-07-25 DIAGNOSIS — Z952 Presence of prosthetic heart valve: Secondary | ICD-10-CM

## 2016-07-25 DIAGNOSIS — T45515A Adverse effect of anticoagulants, initial encounter: Secondary | ICD-10-CM | POA: Diagnosis present

## 2016-07-25 DIAGNOSIS — Z8249 Family history of ischemic heart disease and other diseases of the circulatory system: Secondary | ICD-10-CM

## 2016-07-25 DIAGNOSIS — Z91040 Latex allergy status: Secondary | ICD-10-CM

## 2016-07-25 DIAGNOSIS — Z79899 Other long term (current) drug therapy: Secondary | ICD-10-CM | POA: Diagnosis not present

## 2016-07-25 DIAGNOSIS — R350 Frequency of micturition: Secondary | ICD-10-CM

## 2016-07-25 DIAGNOSIS — Z883 Allergy status to other anti-infective agents status: Secondary | ICD-10-CM

## 2016-07-25 DIAGNOSIS — Z91048 Other nonmedicinal substance allergy status: Secondary | ICD-10-CM

## 2016-07-25 DIAGNOSIS — R791 Abnormal coagulation profile: Secondary | ICD-10-CM

## 2016-07-25 DIAGNOSIS — M7981 Nontraumatic hematoma of soft tissue: Secondary | ICD-10-CM

## 2016-07-25 DIAGNOSIS — R413 Other amnesia: Secondary | ICD-10-CM | POA: Diagnosis present

## 2016-07-25 HISTORY — DX: Repeated falls: R29.6

## 2016-07-25 HISTORY — DX: Urinary tract infection, site not specified: N39.0

## 2016-07-25 LAB — URINALYSIS, ROUTINE W REFLEX MICROSCOPIC
Bilirubin Urine: NEGATIVE
GLUCOSE, UA: NEGATIVE mg/dL
Ketones, ur: NEGATIVE mg/dL
Nitrite: NEGATIVE
PH: 5 (ref 5.0–8.0)
Protein, ur: NEGATIVE mg/dL
SPECIFIC GRAVITY, URINE: 1.013 (ref 1.005–1.030)

## 2016-07-25 LAB — CBC WITH DIFFERENTIAL/PLATELET
Basophils Absolute: 0 10*3/uL (ref 0.0–0.1)
Basophils Relative: 0 %
EOS ABS: 0.3 10*3/uL (ref 0.0–0.7)
Eosinophils Relative: 4 %
HEMATOCRIT: 36.4 % (ref 36.0–46.0)
HEMOGLOBIN: 11.4 g/dL — AB (ref 12.0–15.0)
LYMPHS ABS: 1.6 10*3/uL (ref 0.7–4.0)
LYMPHS PCT: 22 %
MCH: 30.5 pg (ref 26.0–34.0)
MCHC: 31.3 g/dL (ref 30.0–36.0)
MCV: 97.3 fL (ref 78.0–100.0)
MONOS PCT: 18 %
Monocytes Absolute: 1.3 10*3/uL — ABNORMAL HIGH (ref 0.1–1.0)
NEUTROS PCT: 56 %
Neutro Abs: 4.1 10*3/uL (ref 1.7–7.7)
Platelets: 287 10*3/uL (ref 150–400)
RBC: 3.74 MIL/uL — AB (ref 3.87–5.11)
RDW: 13.8 % (ref 11.5–15.5)
WBC: 7.3 10*3/uL (ref 4.0–10.5)

## 2016-07-25 LAB — URINE MICROSCOPIC-ADD ON

## 2016-07-25 LAB — COMPREHENSIVE METABOLIC PANEL
ALBUMIN: 3.5 g/dL (ref 3.5–5.0)
ALK PHOS: 75 U/L (ref 38–126)
ALT: 12 U/L — ABNORMAL LOW (ref 14–54)
AST: 20 U/L (ref 15–41)
Anion gap: 9 (ref 5–15)
BILIRUBIN TOTAL: 0.5 mg/dL (ref 0.3–1.2)
BUN: 64 mg/dL — AB (ref 6–20)
CO2: 28 mmol/L (ref 22–32)
Calcium: 8.9 mg/dL (ref 8.9–10.3)
Chloride: 103 mmol/L (ref 101–111)
Creatinine, Ser: 2.04 mg/dL — ABNORMAL HIGH (ref 0.44–1.00)
GFR calc Af Amer: 26 mL/min — ABNORMAL LOW (ref >60)
GFR calc non Af Amer: 23 mL/min — ABNORMAL LOW (ref >60)
GLUCOSE: 104 mg/dL — AB (ref 65–99)
POTASSIUM: 4.9 mmol/L (ref 3.5–5.1)
SODIUM: 140 mmol/L (ref 135–145)
TOTAL PROTEIN: 7.6 g/dL (ref 6.5–8.1)

## 2016-07-25 LAB — I-STAT CG4 LACTIC ACID, ED: Lactic Acid, Venous: 0.66 mmol/L (ref 0.5–1.9)

## 2016-07-25 LAB — LIPASE, BLOOD: Lipase: 43 U/L (ref 11–51)

## 2016-07-25 LAB — PROTIME-INR
INR: 5.55
Prothrombin Time: 52 seconds — ABNORMAL HIGH (ref 11.4–15.2)

## 2016-07-25 LAB — BRAIN NATRIURETIC PEPTIDE: B Natriuretic Peptide: 217.2 pg/mL — ABNORMAL HIGH (ref 0.0–100.0)

## 2016-07-25 MED ORDER — SODIUM CHLORIDE 0.9 % IV BOLUS (SEPSIS)
1000.0000 mL | Freq: Once | INTRAVENOUS | Status: AC
  Administered 2016-07-25: 1000 mL via INTRAVENOUS

## 2016-07-25 NOTE — Progress Notes (Signed)
Accepted to [facility] med-surg bed from [facility] (Dr. Clarene Duke) for management of AKI on CKD III and supratherapeutic INR with spontaneous rectus sheath hematoma. She has hx of a fib on coumadin and COPD with chronic resp failure and presented with 5 days of lower abd pain despite completing treatment for suspected UTI. CT abdomen reveals rectus sheath hematoma. Vitals have been stable. She is being hydrated gently. As the pain started 5 days ago, and she seems to be stable, vit K was not given. EDP discussed with surgery who did not feel they needed to be involved.

## 2016-07-25 NOTE — ED Notes (Signed)
Patient transported to X-ray 

## 2016-07-25 NOTE — Telephone Encounter (Signed)
Caller name:Crystal [name] Relation to [place]:PXTGG daughter Call back number:[phone] Pharmacy:[facility] [address]  Reason for call: pt was seen on 10/20 for UTI, granddaughter states that her home nurse is concern that she still has the UTI, pt has a broken ankle, wants to know if Efraim Kaufmann can call in a stronger antibiotic or if the pt has to come back in for another urine test. Please advise

## 2016-07-25 NOTE — Telephone Encounter (Signed)
Crystal calling back checking on the status of message below.

## 2016-07-25 NOTE — ED Triage Notes (Signed)
Pt was being treated for UTI last week-completed abx-c/o lower abd pain x 5 days-presents to triage in w/c with home O2

## 2016-07-25 NOTE — Telephone Encounter (Signed)
Caller name: [place] Relationship to patient: [facility] Can be reached: [phone]   Reason for call: [facility] request call back for verbal order to provide OT and Home Health Care for patient two times a week for 6 weeks.

## 2016-07-25 NOTE — Telephone Encounter (Signed)
The urine culture that we obtained on 10/20 just grew a small amount of bacteria consistent with contamination from the outside skin bacteria.  I would recommend repeat UA and culture please.

## 2016-07-25 NOTE — ED Provider Notes (Signed)
[facility] Provider Note   CSN: [id number] Arrival date & time: 07/25/16  1616     History   Chief Complaint Chief Complaint  Patient presents with  . Abdominal Pain    HPI [name] is a 75 y.o. female.  75yo F w/ PMH including A fib on coumadin, DVT, CAD, CHF, IVC filter, CVA who p/w abdominal pain. Pt has had 1 week of lower abdominal pain. She was evaluated by her PCP and given antibiotics for a UTI. She completed 5 days but her abdominal pain has not improved. Daughter thinks that the antibiotic was Keflex. Patient states that the pain is worse with any movement and better lying still. She denies any associated nausea, vomiting, or fevers. She had some diarrhea approximately 5 days ago but has not had a bowel movement since then. She reports history of constipation. No blood in her stool. No urinary symptoms. Daughter does note that her chronic shortness of breath seems to be slightly worse recently. No cough/cold symptoms. No change in her home oxygen level of 2 L. She does not check daily weights.   The history is provided by the patient.    Past Medical History:  Diagnosis Date  . A-fib [facility])    Not on coumadin due to intracebral hemorrhage  . Anemia   . Anxiety   . Aortic insufficiency    s/p tissue AVR 2013  . 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 Coronary artery disease 2013   non obstructive  . DOE (dyspnea on exertion)   . DVT (deep venous thrombosis) (HCC)   . Exogenous obesity   . Frequent falls   . Frequent UTI   . Heart murmur    prior AVR with asacending aortic root replacement August 2013  . History of CHF (congestive heart failure)   . History of gout   . History of ventricular tachycardia   . Hypertension   . Myocardial infarction 2000  . Osteopenia 09/01/2013  . Peripheral edema   . Presence of IVC filter 2000  . Renal insufficiency   . Stroke [facility]) 2000    Patient Active  Problem List   Diagnosis Date Noted  . Acute kidney injury (HCC) 07/25/2016  . Preventative health care 09/11/2015  . Gait instability 05/01/2015  . Dyspnea 07/03/2014  . Chronic hypoxemic respiratory failure (HCC) 07/03/2014  . NICM- EF 20% 2013- 45-50% Feb 2015 06/21/2014  . LBBB (left bundle branch block) 06/21/2014  . Chronic anticoagulation- Coumadin 06/21/2014  . Bronchitis with bronchospasm 06/21/2014  . Low back pain 06/13/2014  . Encounter for therapeutic drug monitoring 11/21/2013  . S/P implantation of automatic cardioverter/defibrillator (AICD) 11/21/2013  . CKD (chronic kidney disease), stage III 11/04/2013  . Chronic atrial fibrillation (HCC) 11/04/2013  . Gout 09/19/2013  . Osteopenia 09/01/2013  . Chronic constipation 07/11/2013  . Annual physical exam 07/08/2012  . H/O tissue AVR and AO root replacement 2013 04/27/2012  . Atherosclerosis of native arteries of the extremities with intermittent claudication 03/03/2012  . Anemia 02/17/2012  . Chronic systolic congestive heart failure, NYHA class 3 (HCC) 02/14/2012  . Cardiac arrest (HCC) 11/04/2011  . Implantable cardiac defibrillator single-chamber-Medtronic 11/04/2011  . Alopecia 11/04/2011  . Aortic insufficiency and aortic stenosis 01/01/2011  . COMBINED HEART FAILURE, ACUTE ON CHRONIC 11/25/2010  . Back pain- s/p recent steroid dose pack 12/19/2009  . Memory loss 09/05/2009  . VENOUS INSUFFICIENCY 01/19/2009  . HYPERLIPIDEMIA 01/02/2009  . HTN (hypertension) 01/02/2009  Past Surgical History:  Procedure Laterality Date  . AORTIC VALVE REPLACEMENT  04/12/2012   Procedure: AORTIC VALVE REPLACEMENT (AVR);  Surgeon: Delight Ovens, MD;  Location: [facility] OR;  Service: Open Heart Surgery;  Laterality: N/A;  with nitric oxide  . CARDIAC CATHETERIZATION  7/13   non obstructive CAD  . CARDIAC DEFIBRILLATOR PLACEMENT  2013   MDT ICD  . CATARACT EXTRACTION    . clot removal  5.2013   blood clot removed from the  back of the left leg  . EMBOLECTOMY  02/15/2012   Procedure: EMBOLECTOMY;  Surgeon: [name], MD;  Location: [facility] OR;  Service: Vascular;  Laterality: Left;  left popliteal embolectomy with drain placement  . EYE SURGERY  2012   removal of cataracts bilaterally  . LEFT HEART CATHETERIZATION WITH CORONARY ANGIOGRAM N/A 04/08/2012   Procedure: LEFT HEART CATHETERIZATION WITH CORONARY ANGIOGRAM;  Surgeon: Tonny Bollman, MD;  Location: [facility] CATH LAB;  Service: Cardiovascular;  Laterality: N/A;  . STOMACH SURGERY     lap band placed and later removed  . THORACIC AORTIC ANEURYSM REPAIR  04/12/2012   Procedure: THORACIC ASCENDING ANEURYSM REPAIR (AAA);  Surgeon: Delight Ovens, MD;  Location: [facility] OR;  Service: Open Heart Surgery;  Laterality: N/A;  . TONSILLECTOMY      OB History    No data available       Home Medications    Prior to Admission medications   Medication Sig Start Date End Date Taking? Authorizing Provider  acetaminophen (TYLENOL) 500 MG tablet Take 500 mg by mouth every 6 (six) hours as needed for moderate pain.    Historical Provider, MD  allopurinol (ZYLOPRIM) 100 MG tablet TAKE 1 TABLET (100 MG TOTAL) BY MOUTH 2 (TWO) TIMES DAILY. 07/12/15   [name], MD  amLODipine (NORVASC) 10 MG tablet Take 1 tablet (10 mg total) by mouth daily. 02/11/16   Jake Bathe, MD  Biotin 5000 MCG CAPS Take 1 capsule (5,000 mcg total) by mouth daily. 09/02/12   Edwyna Perfect, MD  bisoprolol (ZEBETA) 10 MG tablet Take 1 tablet (10 mg total) by mouth 2 (two) times daily. 04/16/16   Duke Salvia, MD  Calcium Carb-Cholecalciferol (CALCIUM-VITAMIN D) 600-400 MG-UNIT TABS Take 1 tablet by mouth daily.    Historical Provider, MD  [employer]S ASPIRIN 81 MG EC tablet TAKE 1 TABLET (81 MG TOTAL) BY MOUTH DAILY. 10/27/14   [name], MD  furosemide (LASIX) 40 MG tablet Take 1 tablet (40 mg total) by mouth daily. 04/22/16 07/21/16  Duke Salvia, MD  lisinopril (PRINIVIL,ZESTRIL) 10 MG  tablet Take 1 tablet (10 mg total) by mouth daily. 07/03/16   Duke Salvia, MD  potassium chloride SA (KLOR-CON M20) 20 MEQ tablet Take 1 tablet (20 mEq total) by mouth 2 (two) times daily. 07/15/16   Sandford Craze, NP  rosuvastatin (CRESTOR) 5 MG tablet TAKE ONE TABLET BY MOUTH ONCE DAILY 02/18/16   Jake Bathe, MD  senna-docusate (SENOKOT S) 8.6-50 MG per tablet Take 2 tablets by mouth at bedtime. For constipation, also available OTC 11/11/13   Ripudeep Jenna Luo, MD  traMADol (ULTRAM) 50 MG tablet Take 50 mg by mouth 4 (four) times daily as needed. 07/09/16   Historical Provider, MD  warfarin (COUMADIN) 5 MG tablet Take 1 tablet (5 mg total) by mouth as directed. 02/18/16   Jake Bathe, MD    Family History Family History  Problem Relation Age of Onset  . Hypertension  Father   . Cancer Father   . Coronary artery disease Father 37  . Cancer Son     pancreas, spine    Social History Social History  Substance Use Topics  . Smoking status: Never Smoker  . Smokeless tobacco: Never Used  . Alcohol use No     Allergies   Adhesive [tape]; Latex; and Neomycin-bacitracin zn-polymyx   Review of Systems Review of Systems 10 Systems reviewed and are negative for acute change except as noted in the HPI.   Physical Exam Updated Vital Signs BP 118/79 (BP Location: Right Wrist)   Pulse 63   Temp 97.8 F (36.6 C) (Oral)   Resp 18   Ht 5\' 2"  (1.575 m)   Wt 208 lb (94.3 kg)   SpO2 100%   BMI 38.04 kg/m   Physical Exam  Constitutional: She is oriented to person, place, and time. She appears well-developed and well-nourished. No distress.  HENT:  Head: Normocephalic and atraumatic.  Mouth/Throat: Oropharynx is clear and moist.  Moist mucous membranes  Eyes: Conjunctivae are normal. Pupils are equal, round, and reactive to light.  Neck: Neck supple.  Cardiovascular: Normal rate and regular rhythm.   Murmur heard.  Systolic murmur is present with a grade of 2/6    Pulmonary/Chest: Effort normal and breath sounds normal.  On 2L [place]  Abdominal: Soft. Bowel sounds are normal. She exhibits no distension. There is tenderness. There is no rebound and no guarding.  TTP RLQ, suprapubic abd, LLQ, L mid abdomen. No peritonitis  Musculoskeletal: She exhibits edema (mild BLE).  R lower leg and foot in walking boot  Neurological: She is alert and oriented to person, place, and time.  Fluent speech  Skin: Skin is warm and dry.  Psychiatric: She has a normal mood and affect. Judgment normal.  Nursing note and vitals reviewed.    ED Treatments / Results  Labs (all labs ordered are listed, but only abnormal results are displayed) Labs Reviewed  COMPREHENSIVE METABOLIC PANEL - Abnormal; Notable for the following:       Result Value   Glucose, Bld 104 (*)    BUN 64 (*)    Creatinine, Ser 2.04 (*)    ALT 12 (*)    GFR calc non Af Amer 23 (*)    GFR calc Af Amer 26 (*)    All other components within normal limits  BRAIN NATRIURETIC PEPTIDE - Abnormal; Notable for the following:    B Natriuretic Peptide 217.2 (*)    All other components within normal limits  CBC WITH DIFFERENTIAL/PLATELET - Abnormal; Notable for the following:    RBC 3.74 (*)    Hemoglobin 11.4 (*)    Monocytes Absolute 1.3 (*)    All other components within normal limits  URINALYSIS, ROUTINE W REFLEX MICROSCOPIC (NOT AT [facility]) - Abnormal; Notable for the following:    APPearance CLOUDY (*)    Hgb urine dipstick TRACE (*)    Leukocytes, UA MODERATE (*)    All other components within normal limits  PROTIME-INR - Abnormal; Notable for the following:    Prothrombin Time 52.0 (*)    INR 5.55 (*)    All other components within normal limits  URINE MICROSCOPIC-ADD ON - Abnormal; Notable for the following:    Squamous Epithelial / LPF 6-30 (*)    Bacteria, UA FEW (*)    Casts HYALINE CASTS (*)    All other components within normal limits  URINE CULTURE  LIPASE, BLOOD  I-STAT CG4 LACTIC  ACID, ED    EKG  EKG Interpretation None       Radiology Ct Abdomen Pelvis Wo Contrast  Result Date: 07/25/2016 CLINICAL DATA:  Lower abdominal pain for 5 days. EXAM: CT ABDOMEN AND PELVIS WITHOUT CONTRAST TECHNIQUE: Multidetector CT imaging of the abdomen and pelvis was performed following the standard protocol without IV contrast. COMPARISON:  CT scan of June 15, 2015. FINDINGS: Lower chest: Visualized lung bases are unremarkable. Hepatobiliary: Large solitary gallstone is again noted without evidence of inflammation. No focal abnormality is noted in the liver on these unenhanced images. Pancreas: Normal. Spleen: Normal. Adrenals/Urinary Tract: Adrenal glands appear normal. Bilateral renal atrophy is noted. No hydronephrosis or renal obstruction is noted. No renal or ureteral calculi are noted. Stomach/Bowel: There is no evidence of bowel obstruction. Vascular/Lymphatic: Atherosclerosis of abdominal aorta is noted without aneurysm formation. Filter is noted in infrarenal IVC. No significant adenopathy is noted. Reproductive: Calcified uterine fibroid is noted. Ovaries are unremarkable. Other: There is interval development of moderate size left rectal sheath hematoma. Musculoskeletal: Severe multilevel degenerative disc disease is noted in the visualized thoracic and lumbar spine. IMPRESSION: Large solitary gallstone is again noted without inflammation. Aortic atherosclerosis. Calcified uterine fibroid. Bilateral renal atrophy with cortical scarring is noted. Interval development of moderate size left rectal sheath hematoma. Electronically Signed   By: Lupita Raider, M.D.   On: 07/25/2016 21:06   Dg Chest 2 View  Result Date: 07/25/2016 CLINICAL DATA:  Shortness of breath for 1 week EXAM: CHEST  2 VIEW COMPARISON:  Chest CT 07/25/2014 FINDINGS: Single lead left chest wall AICD is present with lead terminating in the right ventricle. A left atrial appendage clip is noted. Prosthetic aortic  valve. Incompletely visualized IVC filter. Atherosclerotic calcification aortic arch. No focal airspace disease or pulmonary edema. No pneumothorax or pleural effusion. IMPRESSION: No active cardiopulmonary disease.  Aortic atherosclerosis. Electronically Signed   By: Deatra Robinson M.D.   On: 07/25/2016 17:43    Procedures Procedures (including critical care time)  Medications Ordered in ED Medications  sodium chloride 0.9 % bolus 1,000 mL (1,000 mLs Intravenous Rate/Dose Change 07/25/16 2114)     Initial Impression / Assessment and Plan / ED Course  I have reviewed the triage vital signs and the nursing notes.  Pertinent labs & imaging results that were available during my care of the patient were reviewed by me and considered in my medical decision making (see chart for details).  Clinical Course   PT w/ 1 week of abdominal pain, No improvement after antibiotic course for UTI. Patient awake and alert, in no acute distress at presentation with stable vital signs on 2 L nasal cannula. She had tenderness across her lower abdomen and left side, no peritonitis. Obtained above lab work as well as CT of abdomen to evaluate for acute process such as appendicitis or diverticulitis. Chest x-ray negative for acute process.  Labwork shows normal lactate, creatinine 2.04, BUN 64 which is up from patient's baseline near 1.5, INR 5.55. Gave the patient 1 L of IV fluids. CT shows moderate size left rectus sheath hematoma. Patient denies any recent trauma to her abdomen and I suspect it is a spontaneous bleed from supratherapeutic INR. I discussed findings with general surgery, Dr. Gerrit Friends, appreciate assistance. He stated that this would not require any surgical intervention and recommended medicine observation admission. I discussed admission with [facility] hospitalist, Dr. Antionette Char, appreciate help taking care of pt. [facility]  Final Clinical Impressions(s) / ED  Diagnoses   Final diagnoses:  Nontraumatic rectus hematoma  Supratherapeutic INR  AKI (acute kidney injury) [facility](HCC)    New Prescriptions New Prescriptions   No medications on file     Laurence Spatesachel Morgan Little, MD 07/25/16 2256

## 2016-07-25 NOTE — ED Notes (Signed)
Pt transported via [facility] condition  stable

## 2016-07-25 NOTE — ED Notes (Signed)
Pt Hx of CHF, 0.9 NSS IVF rate decreased to 125 ml/hr after of liter bolus infused. Mrs Helling currently denies SOB at rest pulmonary assessment unchanged from previous.

## 2016-07-25 NOTE — Telephone Encounter (Signed)
Notified pt's granddauther. She states home health will not be out again until Tuesday but they can bring pt in now. Future order entered.

## 2016-07-25 NOTE — Telephone Encounter (Signed)
Spoke with Crystal. She states that pt still has urinary frequency, decreased urine output, lower pelvic pain. Started Cephalexin last Saturday and has a few more days left. Please advise if different antibiotic could called in?

## 2016-07-25 NOTE — ED Notes (Signed)
Attempted to call report to [facility]

## 2016-07-25 NOTE — Telephone Encounter (Signed)
Notified [place] of below authorization and she requests order for home health aid as well.  Authorization given.

## 2016-07-26 ENCOUNTER — Encounter (HOSPITAL_COMMUNITY): Payer: Self-pay | Admitting: Family Medicine

## 2016-07-26 DIAGNOSIS — R791 Abnormal coagulation profile: Secondary | ICD-10-CM | POA: Diagnosis present

## 2016-07-26 DIAGNOSIS — S301XXA Contusion of abdominal wall, initial encounter: Secondary | ICD-10-CM

## 2016-07-26 DIAGNOSIS — R413 Other amnesia: Secondary | ICD-10-CM

## 2016-07-26 DIAGNOSIS — D62 Acute posthemorrhagic anemia: Secondary | ICD-10-CM | POA: Diagnosis present

## 2016-07-26 DIAGNOSIS — N179 Acute kidney failure, unspecified: Secondary | ICD-10-CM

## 2016-07-26 DIAGNOSIS — J9611 Chronic respiratory failure with hypoxia: Secondary | ICD-10-CM

## 2016-07-26 DIAGNOSIS — Z7901 Long term (current) use of anticoagulants: Secondary | ICD-10-CM

## 2016-07-26 DIAGNOSIS — I5022 Chronic systolic (congestive) heart failure: Secondary | ICD-10-CM

## 2016-07-26 DIAGNOSIS — I482 Chronic atrial fibrillation: Secondary | ICD-10-CM

## 2016-07-26 DIAGNOSIS — I10 Essential (primary) hypertension: Secondary | ICD-10-CM

## 2016-07-26 DIAGNOSIS — N183 Chronic kidney disease, stage 3 (moderate): Secondary | ICD-10-CM

## 2016-07-26 LAB — PROTIME-INR
INR: 5.27 — AB
Prothrombin Time: 49.9 seconds — ABNORMAL HIGH (ref 11.4–15.2)

## 2016-07-26 LAB — CBC
HEMATOCRIT: 37.2 % (ref 36.0–46.0)
HEMOGLOBIN: 11.9 g/dL — AB (ref 12.0–15.0)
MCH: 30.4 pg (ref 26.0–34.0)
MCHC: 32 g/dL (ref 30.0–36.0)
MCV: 94.9 fL (ref 78.0–100.0)
Platelets: 238 10*3/uL (ref 150–400)
RBC: 3.92 MIL/uL (ref 3.87–5.11)
RDW: 13.9 % (ref 11.5–15.5)
WBC: 6.6 10*3/uL (ref 4.0–10.5)

## 2016-07-26 LAB — BASIC METABOLIC PANEL
ANION GAP: 11 (ref 5–15)
BUN: 48 mg/dL — ABNORMAL HIGH (ref 6–20)
CHLORIDE: 105 mmol/L (ref 101–111)
CO2: 25 mmol/L (ref 22–32)
Calcium: 8.8 mg/dL — ABNORMAL LOW (ref 8.9–10.3)
Creatinine, Ser: 1.65 mg/dL — ABNORMAL HIGH (ref 0.44–1.00)
GFR calc non Af Amer: 30 mL/min — ABNORMAL LOW (ref >60)
GFR, EST AFRICAN AMERICAN: 34 mL/min — AB (ref >60)
Glucose, Bld: 78 mg/dL (ref 65–99)
Potassium: 4.2 mmol/L (ref 3.5–5.1)
Sodium: 141 mmol/L (ref 135–145)

## 2016-07-26 LAB — SODIUM, URINE, RANDOM: SODIUM UR: 42 mmol/L

## 2016-07-26 MED ORDER — PHYTONADIONE 5 MG PO TABS
2.5000 mg | ORAL_TABLET | Freq: Once | ORAL | Status: AC
  Administered 2016-07-26: 2.5 mg via ORAL
  Filled 2016-07-26: qty 1

## 2016-07-26 MED ORDER — OXYCODONE HCL 5 MG PO TABS
2.5000 mg | ORAL_TABLET | Freq: Four times a day (QID) | ORAL | Status: DC | PRN
  Administered 2016-07-26 – 2016-07-27 (×4): 2.5 mg via ORAL
  Filled 2016-07-26 (×4): qty 1

## 2016-07-26 MED ORDER — PHYTONADIONE 5 MG PO TABS
5.0000 mg | ORAL_TABLET | Freq: Once | ORAL | Status: AC
  Administered 2016-07-26: 5 mg via ORAL
  Filled 2016-07-26: qty 1

## 2016-07-26 MED ORDER — POLYETHYLENE GLYCOL 3350 17 G PO PACK
17.0000 g | PACK | Freq: Every day | ORAL | Status: DC | PRN
Start: 2016-07-26 — End: 2016-07-28
  Filled 2016-07-26: qty 1

## 2016-07-26 MED ORDER — ONDANSETRON HCL 4 MG/2ML IJ SOLN: 4.0000 mg | Freq: Four times a day (QID) | INTRAMUSCULAR | Status: DC | PRN

## 2016-07-26 MED ORDER — ONDANSETRON HCL 4 MG PO TABS: 4.0000 mg | ORAL_TABLET | Freq: Four times a day (QID) | ORAL | Status: DC | PRN

## 2016-07-26 MED ORDER — AMLODIPINE BESYLATE 10 MG PO TABS
10.0000 mg | ORAL_TABLET | Freq: Every day | ORAL | Status: DC
  Administered 2016-07-26 – 2016-07-28 (×3): 10 mg via ORAL
  Filled 2016-07-26 (×3): qty 1

## 2016-07-26 MED ORDER — TRAMADOL HCL 50 MG PO TABS
50.0000 mg | ORAL_TABLET | Freq: Four times a day (QID) | ORAL | Status: DC | PRN
  Administered 2016-07-27 (×2): 50 mg via ORAL
  Filled 2016-07-26 (×3): qty 1

## 2016-07-26 MED ORDER — ROSUVASTATIN CALCIUM 10 MG PO TABS
5.0000 mg | ORAL_TABLET | Freq: Every day | ORAL | Status: DC
  Administered 2016-07-26 – 2016-07-28 (×3): 5 mg via ORAL
  Filled 2016-07-26 (×3): qty 1

## 2016-07-26 MED ORDER — SENNOSIDES-DOCUSATE SODIUM 8.6-50 MG PO TABS
2.0000 | ORAL_TABLET | Freq: Every day | ORAL | Status: DC
  Administered 2016-07-26 – 2016-07-27 (×3): 2 via ORAL
  Filled 2016-07-26 (×4): qty 2

## 2016-07-26 MED ORDER — ALLOPURINOL 100 MG PO TABS
100.0000 mg | ORAL_TABLET | Freq: Two times a day (BID) | ORAL | Status: DC
  Administered 2016-07-26 – 2016-07-28 (×5): 100 mg via ORAL
  Filled 2016-07-26 (×5): qty 1

## 2016-07-26 MED ORDER — BISOPROLOL FUMARATE 5 MG PO TABS
10.0000 mg | ORAL_TABLET | Freq: Two times a day (BID) | ORAL | Status: DC
  Administered 2016-07-26 – 2016-07-28 (×5): 10 mg via ORAL
  Filled 2016-07-26 (×5): qty 2
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
## 2016-07-26 NOTE — Progress Notes (Signed)
Patient seen and examined  75 y/o ? 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

## 2016-07-26 NOTE — Progress Notes (Signed)
ANTICOAGULATION CONSULT NOTE - Initial Consult  Pharmacy Consult for Warfarin  Indication: atrial fibrillation, hx CVA, hx tissue AVR, aortic root replacement  Allergies  Allergen Reactions  . Adhesive [Tape] Itching, Rash and Other (See Comments)    Redness and skin breaks down  . Latex Rash  . Neomycin-Bacitracin Zn-Polymyx Rash    Patient Measurements: Height: 5\' 2"  (157.5 cm) Weight: 207 lb 7.3 oz (94.1 kg) (patient wearing fracture boot) IBW/kg (Calculated) : 50.1  Vital Signs: Temp: 98.7 F (37.1 C) (10/28 0034) Temp Source: Oral (10/28 0034) BP: 129/64 (10/28 0034) Pulse Rate: 63 (10/28 0034)  Labs:  Recent Labs  07/25/16 1805  HGB 11.4*  HCT 36.4  PLT 287  LABPROT 52.0*  INR 5.55*  CREATININE 2.04*    Estimated Creatinine Clearance: 25.9 mL/min (by C-G formula based on SCr of 2.04 mg/dL (H)).   Medical History: Past Medical History:  Diagnosis Date  . A-fib [facility])    Not on coumadin due to intracebral hemorrhage  . Anemia   . Anxiety   . Aortic insufficiency    s/p tissue AVR 2013  . 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 Coronary artery disease 2013   non obstructive  . DOE (dyspnea on exertion)   . DVT (deep venous thrombosis) (HCC)   . Exogenous obesity   . Frequent falls   . Frequent UTI   . Heart murmur    prior AVR with asacending aortic root replacement August 2013  . History of CHF (congestive heart failure)   . History of gout   . History of ventricular tachycardia   . Hypertension   . Myocardial infarction 2000  . Osteopenia 09/01/2013  . Peripheral edema   . Presence of IVC filter 2000  . Renal insufficiency   . Stroke [facility]) 2000    Assessment: 75 y/o F with UTI here with abdominal pain, on warfarin PTA for multiple indications, INR supra-therapeutic at 5.55 on admit  Goal of Therapy:  INR 2-3 Monitor platelets by anticoagulation protocol: Yes   Plan:  -Hold warfarin for  now -Daily PT/INR, re-start warfarin as INR allows -Monitor for bleeding  Abran Duke 07/26/2016,2:12 AM

## 2016-07-26 NOTE — Evaluation (Signed)
Physical Therapy Evaluation Patient Details Name: [name] MRN: [id number] DOB: 30-Dec-1940 Today's Date: 07/26/2016   History of Present Illness  Patient is a 75 yo female admitted 07/25/16 with LLQ abdominal pain.  Patient with rectus sheath hematoma, supratherapeutic INR.  Patient with recent fall with Rt foot fx in walking boot, recent UTI, and recent memory loss per daughter.     PMH:  mild dementia, CKD, CHF, HTN, NICM, ICD, CVA, Afib, frequent falls  Clinical Impression  Patient presents with problems listed below.  Will benefit from acute PT to maximize functional mobility prior to discharge.  Patient requiring min to mod assist for mobility and gait, and has decreased memory.  Recommend SNF at d/c for continued therapy.    Follow Up Recommendations SNF;Supervision/Assistance - 24 hour    Equipment Recommendations  Other (comment) (Daughter requesting tub bench)    Recommendations for Other Services       Precautions / Restrictions Precautions Precautions: Fall Precaution Comments: frequent falls per chart Required Braces or Orthoses: Other Brace/Splint Other Brace/Splint: Walking boot Rt LE Restrictions Weight Bearing Restrictions: No      Mobility  Bed Mobility Overal bed mobility: Needs Assistance Bed Mobility: Supine to Sit;Sit to Supine     Supine to sit: Mod assist Sit to supine: Min assist   General bed mobility comments: Assist to bring trunk to sitting position.  Transfers Overall transfer level: Needs assistance Equipment used: Rolling walker (2 wheeled) Transfers: Sit to/from Stand Sit to Stand: Mod assist         General transfer comment: Verbal cues for hand placement.  Mod assist to power up to standing.  Ambulation/Gait Ambulation/Gait assistance: Min assist Ambulation Distance (Feet): 22 Feet Assistive device: Rolling walker (2 wheeled) Gait Pattern/deviations: Step-through pattern;Decreased step length - right;Decreased step  length - left;Decreased stride length;Trunk flexed Gait velocity: decreased Gait velocity interpretation: Below normal speed for age/gender General Gait Details: Patient with slow gait speed and flexed posture due to abdominal pain.   Pain limiting mobility.  Stairs            Wheelchair Mobility    Modified Rankin (Stroke Patients Only)       Balance Overall balance assessment: Needs assistance;History of Falls Sitting-balance support: No upper extremity supported;Feet supported Sitting balance-Leahy Scale: Fair     Standing balance support: Bilateral upper extremity supported Standing balance-Leahy Scale: Poor                               Pertinent Vitals/Pain Pain Assessment: Faces Faces Pain Scale: Hurts whole lot Pain Location: Left lower abdomen, during mobility Pain Descriptors / Indicators: Grimacing;Guarding;Moaning Pain Intervention(s): Monitored during session;Limited activity within patient's tolerance;Repositioned    Home Living Family/patient expects to be discharged to:: Skilled nursing facility Living Arrangements: Children (Daughter works. Patient home alone.)             Home Equipment: Walker - 2 wheels;Bedside commode;Shower seat;Wheelchair - manual      Prior Function Level of Independence: Independent with assistive device(s);Needs assistance   Gait / Transfers Assistance Needed: Ambulated independently prior to foot fx.  Now using RW.  ADL's / Homemaking Assistance Needed: Assist with meal prep, housekeeping.        Hand Dominance        Extremity/Trunk Assessment   Upper Extremity Assessment: Generalized weakness           Lower Extremity Assessment: Generalized weakness  Communication   Communication: No difficulties  Cognition Arousal/Alertness: Awake/alert Behavior During Therapy: WFL for tasks assessed/performed Overall Cognitive Status: History of cognitive impairments - at baseline  (Recent decline per daughter)       Memory: Decreased short-term memory              General Comments      Exercises     Assessment/Plan    PT Assessment Patient needs continued PT services  PT Problem List Decreased strength;Decreased activity tolerance;Decreased balance;Decreased mobility;Decreased cognition;Decreased knowledge of use of DME;Cardiopulmonary status limiting activity;Obesity;Pain          PT Treatment Interventions DME instruction;Gait training;Functional mobility training;Therapeutic activities;Therapeutic exercise;Cognitive remediation;Patient/family education    PT Goals (Current goals can be found in the Care Plan section)  Acute Rehab PT Goals Patient Stated Goal: To get stronger PT Goal Formulation: With patient/family Time For Goal Achievement: 08/09/16 Potential to Achieve Goals: Good    Frequency Min 3X/week   Barriers to discharge Decreased caregiver support Patient does not have 24 hour assist.    Co-evaluation               End of Session Equipment Utilized During Treatment: Gait belt;Oxygen Activity Tolerance: Patient limited by pain Patient left: in bed;with call bell/phone within reach;with bed alarm set;with family/visitor present Nurse Communication: Mobility status (Recommending SNF; Abd binder for discomfort)         Time: [id number] PT Time Calculation (min) (ACUTE ONLY): 31 min   Charges:   PT Evaluation $PT Eval Moderate Complexity: 1 Procedure PT Treatments $Gait Training: 8-22 mins   PT G Codes:        Vena AustriaDavis, Doll Frazee H 07/26/2016, 7:34 PM Durenda HurtSusan H. Renaldo Fiddleravis, PT, [facility] Acute Rehab Services Pager [phone]

## 2016-07-27 LAB — BASIC METABOLIC PANEL
Anion gap: 9 (ref 5–15)
BUN: 30 mg/dL — ABNORMAL HIGH (ref 6–20)
CO2: 25 mmol/L (ref 22–32)
Calcium: 9 mg/dL (ref 8.9–10.3)
Chloride: 108 mmol/L (ref 101–111)
Creatinine, Ser: 1.31 mg/dL — ABNORMAL HIGH (ref 0.44–1.00)
GFR calc Af Amer: 45 mL/min — ABNORMAL LOW (ref >60)
GFR calc non Af Amer: 39 mL/min — ABNORMAL LOW (ref >60)
Glucose, Bld: 81 mg/dL (ref 65–99)
Potassium: 4.3 mmol/L (ref 3.5–5.1)
Sodium: 142 mmol/L (ref 135–145)

## 2016-07-27 LAB — CBC WITH DIFFERENTIAL/PLATELET
Basophils Absolute: 0 10*3/uL (ref 0.0–0.1)
Basophils Relative: 0 %
EOS ABS: 0.3 10*3/uL (ref 0.0–0.7)
EOS PCT: 4 %
HCT: 34.2 % — ABNORMAL LOW (ref 36.0–46.0)
Hemoglobin: 10.6 g/dL — ABNORMAL LOW (ref 12.0–15.0)
LYMPHS ABS: 1.7 10*3/uL (ref 0.7–4.0)
Lymphocytes Relative: 27 %
MCH: 30.2 pg (ref 26.0–34.0)
MCHC: 31 g/dL (ref 30.0–36.0)
MCV: 97.4 fL (ref 78.0–100.0)
Monocytes Absolute: 1 10*3/uL (ref 0.1–1.0)
Monocytes Relative: 15 %
Neutro Abs: 3.6 10*3/uL (ref 1.7–7.7)
Neutrophils Relative %: 54 %
PLATELETS: 248 10*3/uL (ref 150–400)
RBC: 3.51 MIL/uL — AB (ref 3.87–5.11)
RDW: 14 % (ref 11.5–15.5)
WBC: 6.5 10*3/uL (ref 4.0–10.5)

## 2016-07-27 LAB — PROTIME-INR
INR: 1.52
PROTHROMBIN TIME: 18.4 s — AB (ref 11.4–15.2)

## 2016-07-27 LAB — UREA NITROGEN, URINE: UREA NITROGEN UR: 768 mg/dL

## 2016-07-27 NOTE — Clinical Social Work Placement (Signed)
   CLINICAL SOCIAL WORK PLACEMENT  NOTE  Date:  07/27/2016  Patient Details  Name: [name] MRN: [id number] Date of Birth: 05-22-1941  Clinical Social Work is seeking post-discharge placement for this patient at the Skilled  Nursing Facility level of care (*CSW will initial, date and re-position this form in  chart as items are completed):  Yes   Patient/family provided with [facility] [facility] Clinical Social Work Department's list of facilities offering this level of care within the geographic area requested by the patient (or if unable, by the patient's family).  Yes   Patient/family informed of their freedom to choose among providers that offer the needed level of care, that participate in Medicare, Medicaid or managed care program needed by the patient, have an available bed and are willing to accept the patient.  Yes   Patient/family informed of [facility]'s ownership interest in [facility] and [facility], as well as of the fact that they are under no obligation to receive care at these facilities.  PASRR submitted to EDS on       PASRR number received on 07/27/16     Existing PASRR number confirmed on       FL2 transmitted to all facilities in geographic area requested by pt/family on 07/27/16     FL2 transmitted to all facilities within larger geographic area on       Patient informed that his/her managed care company has contracts with or will negotiate with certain facilities, including the following:            Patient/family informed of bed offers received.  Patient chooses bed at       Physician recommends and patient chooses bed at      Patient to be transferred to   on  .  Patient to be transferred to facility by       Patient family notified on   of transfer.  Name of family member notified:        PHYSICIAN       Additional Comment:    _______________________________________________ Linna Caprice, LCSW 07/27/2016, 2:52 PM

## 2016-07-27 NOTE — Progress Notes (Signed)
Heather AmmonsJeritta R [name] ZOX:[id number] DOB: 07/02/1941 DOA: 07/25/2016 PCP: [place] Fillers'SULLIVAN,MELISSA S., NP  Brief narrative44: 75 y/o ? Recent diagnosis Amaurosis Fugax Also recently Rx for Urinary infection S/p ICD NICM- EF 20% 2013- 45-50% Feb 2015--> improvement to 50-55% on last echo,  MOD AoV Insuff s/p Bioprosthetic valve/AO Root replacement 2013 ?Aortic aneurysm s/p repair Remote h/o Intracerebral bleed yr 2001-R sided weakness/expressive aphasia Arterial embolism to LLE -Patch angioplasty of popliteal and post tibial artery 02/15/12 IVC filter placed 2002 post dvt Perm Afib since oct 2001, CHad2Vasc2=7 on Coumadin  Admitted with Fall 1 week prior and then increasin Flank pain Came to Ed and found to have Rectus sheath Hematoma  Past medical history-As per Problem list Chart reviewed as below-   Consultants:  none  Procedures:  nonoe  Antibiotics:  none   Subjective  Alert pleasant in nad tol diet No cp Daughter cites memory loss    Objective     Objective: Vitals:   07/26/16 0550 07/26/16 0904 07/26/16 1729 07/27/16 0512  BP: 120/71 105/60 131/77 122/66  Pulse: 63 63 64 65  Resp: 18 18 18 18   Temp: 98.7 F (37.1 C) 98 F (36.7 C) 98.3 F (36.8 C) 98.4 F (36.9 C)  TempSrc: Oral Oral Oral Oral  SpO2: 100% 100% 100% 100%  Weight:      Height:        Intake/Output Summary (Last 24 hours) at 07/27/16 0736 Last data filed at 07/27/16 0600  Gross per 24 hour  Intake              880 ml  Output             1525 ml  Net             -645 ml    Exam:  General: eomi ncat, smile symm, trachea miline Cardiovascular: s1 s 2 HSM Respiratory: clear no added sound Abdomen:  Soft nt nd bruiding to the L flank, mild abd discomfort on pressure Skin no le edema-post op scar R knee Neuro intact, moves all 4 limbs equally, has boot on R side.  Power 5/5   Data Reviewed: Basic Metabolic Panel:  Recent Labs Lab 07/25/16 1805 07/26/16 0504  NA 140 141  K 4.9  4.2  CL 103 105  CO2 28 25  GLUCOSE 104* 78  BUN 64* 48*  CREATININE 2.04* 1.65*  CALCIUM 8.9 8.8*   Liver Function Tests:  Recent Labs Lab 07/25/16 1805  AST 20  ALT 12*  ALKPHOS 75  BILITOT 0.5  PROT 7.6  ALBUMIN 3.5    Recent Labs Lab 07/25/16 1805  LIPASE 43   No results for input(s): AMMONIA in the last 168 hours. CBC:  Recent Labs Lab 07/25/16 1805 07/26/16 0504 07/27/16 0557  WBC 7.3 6.6 6.5  NEUTROABS 4.1  --  3.6  HGB 11.4* 11.9* 10.6*  HCT 36.4 37.2 34.2*  MCV 97.3 94.9 97.4  PLT 287 238 248   Cardiac Enzymes: No results for input(s): CKTOTAL, CKMB, CKMBINDEX, TROPONINI in the last 168 hours. BNP: Invalid input(s): POCBNP CBG: No results for input(s): GLUCAP in the last 168 hours.  Recent Results (from the past 240 hour(s))  Urine Culture     Status: None   Collection Time: 07/18/16  3:34 PM  Result Value Ref Range Status   Organism ID, Bacteria   Final    Three or more organisms present,each greater than 10,000 CFU/mL.These organisms,commonly found on external and internal genitalia,are  considered to be colonizers.No further testing performed.      Studies:              All Imaging reviewed and is as per above notation   Scheduled Meds: . allopurinol  100 mg Oral BID  . amLODipine  10 mg Oral Daily  . bisoprolol  10 mg Oral BID  . rosuvastatin  5 mg Oral Daily  . senna-docusate  2 tablet Oral QHS   Continuous Infusions:    Assessment/Plan:  Rectus sheath hematoma -holding coumadin and ASA at this time INR subtx at 1.5 Will need to hold off of the same probably for 3-4 daysand then resume  Prior NICM prior EF20%, improved 50-55% last echo Chr Sys HF Cont amlodipine 10 od, bisoprolol 10 bid  Lisinopril 10 on hold and probably should be on hold on d/c Lasix 40 qd on hold  AKI Initial BUn/creat 64/2.0--Currently 30/1.3 HOld Lisninopril  Mod Aov and AoV root repair 2013 See above  Prior arterial embolism 2013 DVT at  same time s/p filter Perm Afib since 2001 Chad2Vasc2=7 -coumadin and ASa on hold as above  Gout Cont allopurinol 100 bid  HLD Cont crestor 5 daily-would increase dose to 20 mg on d/c   Memory loss Long discussion about 10 min at bedside 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

## 2016-07-27 NOTE — Clinical Social Work Note (Signed)
Clinical Social Work Assessment  Patient Details  Name: [name] MRN: [id number] Date of Birth: 08-Sep-1941  Date of referral:  07/27/16               Reason for consult:  Discharge Planning                Permission sought to share information with:  Oceanographer granted to share information::  No  Name::        Agency::     Relationship::     Contact Information:     Housing/Transportation Living arrangements for the past 2 months:  Single Family Home Source of Information:  Patient, Adult Children Patient Interpreter Needed:  None Criminal Activity/Legal Involvement Pertinent to Current Situation/Hospitalization:  No - Comment as needed Significant Relationships:  Adult Children Lives with:  Adult Children Do you feel safe going back to the place where you live?  No Need for family participation in patient care:  No (Coment)  Care giving concerns:  Pt lives at home with daughter and will need SNF level therapies in order to return to her PLOF.  Pt is agreeable to [facility] SNF prior to returning home.  Pt has been in [facility] before, and prefers to return there if bed offer is extended.     Social Worker assessment / plan:  CSW will begin bed search in [employer] per pt/family request and will facilitate NH as appropriate.    Employment status:  Retired Database administrator PT Recommendations:  Skilled Nursing Facility Information / Referral to community resources:     Patient/Family's Response to care:  Agreeable to SNF bed search and appreciative of CSW attention and advice.  Patient/Family's Understanding of and Emotional Response to Diagnosis, Current Treatment, and Prognosis: Pt understands that she is weak/deconditioned and will need rehab prior to returning home with her daughter.  Emotional Assessment Appearance:  Appears stated age Attitude/Demeanor/Rapport:   (cooperative) Affect (typically observed):   Accepting, Appropriate, Calm Orientation:  Oriented to Self, Oriented to Place, Oriented to  Time, Oriented to Situation Alcohol / Substance use:    Psych involvement (Current and /or in the community):  No (Comment)  Discharge Needs  Concerns to be addressed:  Discharge Planning Concerns Readmission within the last 30 days:  No Current discharge risk:  None Barriers to Discharge:  English as a second language teacher, Continued Medical Work up   [employer], Randol Kern, LCSW 07/27/2016, 2:56 PM

## 2016-07-27 NOTE — NC FL2 (Signed)
[place] MEDICAID FL2 LEVEL OF CARE SCREENING TOOL     IDENTIFICATION  Patient Name: Heather AmmonsJeritta R Ruud Birthdate: 10/29/1940 Sex: female Admission Date (Current Location): 07/25/2016  [facility] [place]   Facility and Address:  [facility], [address], [place], [place] [address]      Provider Number: [id number]  Attending Physician Name and Address:  Rhetta MuraJai-Gurmukh Samtani, MD  Relative Name and Phone Number:       Current Level of Care: Hospital Recommended Level of Care: Skilled Nursing Facility Prior Approval Number:    Date Approved/Denied:   PASRR Number:  [id number])  Discharge Plan: SNF    Current Diagnoses: Patient Active Problem List   Diagnosis Date Noted  . Supratherapeutic INR 07/26/2016  . Rectus sheath hematoma, initial encounter 07/26/2016  . Acute blood loss anemia 07/26/2016  . Acute kidney injury (HCC) 07/25/2016  . Preventative health care 09/11/2015  . Gait instability 05/01/2015  . Dyspnea 07/03/2014  . Chronic hypoxemic respiratory failure (HCC) 07/03/2014  . NICM- EF 20% 2013- 45-50% Feb 2015 06/21/2014  . LBBB (left bundle branch block) 06/21/2014  . Chronic anticoagulation- Coumadin 06/21/2014  . Bronchitis with bronchospasm 06/21/2014  . Low back pain 06/13/2014  . Encounter for therapeutic drug monitoring 11/21/2013  . S/P implantation of automatic cardioverter/defibrillator (AICD) 11/21/2013  . CKD (chronic kidney disease), stage III 11/04/2013  . Chronic atrial fibrillation (HCC) 11/04/2013  . Gout 09/19/2013  . Osteopenia 09/01/2013  . Chronic constipation 07/11/2013  . Annual physical exam 07/08/2012  . H/O tissue AVR and AO root replacement 2013 04/27/2012  . Atherosclerosis of native arteries of the extremities with intermittent claudication 03/03/2012  . Anemia 02/17/2012  . Chronic systolic congestive heart failure, NYHA class 3 (HCC) 02/14/2012  . Cardiac arrest (HCC)  11/04/2011  . Implantable cardiac defibrillator single-chamber-Medtronic 11/04/2011  . Alopecia 11/04/2011  . Aortic insufficiency and aortic stenosis 01/01/2011  . COMBINED HEART FAILURE, ACUTE ON CHRONIC 11/25/2010  . Back pain- s/p recent steroid dose pack 12/19/2009  . Memory loss 09/05/2009  . VENOUS INSUFFICIENCY 01/19/2009  . HYPERLIPIDEMIA 01/02/2009  . Essential hypertension 01/02/2009    Orientation RESPIRATION BLADDER Height & Weight     Self, Time, Situation, Place  O2 (2/LPM) Continent Weight: 207 lb 7.3 oz (94.1 kg) (patient wearing fracture boot) Height:  5\' 2"  (157.5 cm)  BEHAVIORAL SYMPTOMS/MOOD NEUROLOGICAL BOWEL NUTRITION STATUS      Continent  (heart healthy)  AMBULATORY STATUS COMMUNICATION OF NEEDS Skin   Limited Assist Verbally Normal                       Personal Care Assistance Level of Assistance  Bathing, Feeding, Dressing Bathing Assistance: Limited assistance Feeding assistance: Limited assistance Dressing Assistance: Limited assistance     Functional Limitations Info  Sight, Hearing, Speech Sight Info: Adequate Hearing Info: Adequate Speech Info: Adequate    SPECIAL CARE FACTORS FREQUENCY  PT (By licensed PT), OT (By licensed OT)     PT Frequency:  (5x/week) OT Frequency:  (5x/week)            Contractures Contractures Info: Not present    Additional Factors Info  Code Status Code Status Info:  (DNR)             Current Medications (07/27/2016):  This is the current hospital active medication list Current Facility-Administered Medications  Medication Dose Route Frequency Provider Last Rate Last Dose  . allopurinol (  ZYLOPRIM) tablet 100 mg  100 mg Oral BID Alberteen Sam, MD   100 mg at 07/27/16 1001  . amLODipine (NORVASC) tablet 10 mg  10 mg Oral Daily Alberteen Sam, MD   10 mg at 07/27/16 1002  . bisoprolol (ZEBETA) tablet 10 mg  10 mg Oral BID Alberteen Sam, MD   10 mg at 07/27/16 1001  .  ondansetron (ZOFRAN) tablet 4 mg  4 mg Oral Q6H PRN Alberteen Sam, MD       Or  . ondansetron (ZOFRAN) injection 4 mg  4 mg Intravenous Q6H PRN Alberteen Sam, MD      . oxyCODONE (Oxy IR/ROXICODONE) immediate release tablet 2.5 mg  2.5 mg Oral Q6H PRN Alberteen Sam, MD   2.5 mg at 07/27/16 1004  . polyethylene glycol (MIRALAX / GLYCOLAX) packet 17 g  17 g Oral Daily PRN Alberteen Sam, MD      . rosuvastatin (CRESTOR) tablet 5 mg  5 mg Oral Daily Alberteen Sam, MD   5 mg at 07/27/16 1002  . senna-docusate (Senokot-S) tablet 2 tablet  2 tablet Oral QHS Alberteen Sam, MD   2 tablet at 07/27/16 0313  . traMADol (ULTRAM) tablet 50 mg  50 mg Oral Q6H PRN Alberteen Sam, MD   50 mg at 07/27/16 1311     Discharge Medications: Please see discharge summary for a list of discharge medications.  Relevant Imaging Results:  Relevant Lab Results:   Additional Information    Janaya Broy M, LCSW

## 2016-07-28 LAB — CBC WITH DIFFERENTIAL/PLATELET
BASOS ABS: 0.1 10*3/uL (ref 0.0–0.1)
BASOS PCT: 1 %
EOS ABS: 0.3 10*3/uL (ref 0.0–0.7)
Eosinophils Relative: 5 %
HEMATOCRIT: 34.3 % — AB (ref 36.0–46.0)
Hemoglobin: 10.7 g/dL — ABNORMAL LOW (ref 12.0–15.0)
Lymphocytes Relative: 27 %
Lymphs Abs: 1.8 10*3/uL (ref 0.7–4.0)
MCH: 30 pg (ref 26.0–34.0)
MCHC: 31.2 g/dL (ref 30.0–36.0)
MCV: 96.1 fL (ref 78.0–100.0)
MONO ABS: 1.2 10*3/uL — AB (ref 0.1–1.0)
Monocytes Relative: 17 %
NEUTROS ABS: 3.5 10*3/uL (ref 1.7–7.7)
Neutrophils Relative %: 50 %
PLATELETS: 249 10*3/uL (ref 150–400)
RBC: 3.57 MIL/uL — ABNORMAL LOW (ref 3.87–5.11)
RDW: 13.6 % (ref 11.5–15.5)
WBC: 6.9 10*3/uL (ref 4.0–10.5)

## 2016-07-28 LAB — URINE CULTURE
Culture: >=100000 — AB
SPECIAL REQUESTS: NORMAL

## 2016-07-28 LAB — BASIC METABOLIC PANEL
ANION GAP: 6 (ref 5–15)
BUN: 27 mg/dL — AB (ref 6–20)
CO2: 29 mmol/L (ref 22–32)
Calcium: 8.8 mg/dL — ABNORMAL LOW (ref 8.9–10.3)
Chloride: 107 mmol/L (ref 101–111)
Creatinine, Ser: 1.49 mg/dL — ABNORMAL HIGH (ref 0.44–1.00)
GFR calc non Af Amer: 33 mL/min — ABNORMAL LOW (ref >60)
GFR, EST AFRICAN AMERICAN: 39 mL/min — AB (ref >60)
GLUCOSE: 92 mg/dL (ref 65–99)
Potassium: 4.4 mmol/L (ref 3.5–5.1)
SODIUM: 142 mmol/L (ref 135–145)

## 2016-07-28 MED ORDER — AMLODIPINE BESYLATE 10 MG PO TABS: 10.0000 mg | ORAL_TABLET | Freq: Every day | ORAL | 3 refills | Status: DC

## 2016-07-28 MED ORDER — ROSUVASTATIN CALCIUM 5 MG PO TABS: 5.0000 mg | ORAL_TABLET | Freq: Every day | ORAL | Status: DC

## 2016-07-28 MED ORDER — TRAMADOL HCL 50 MG PO TABS: 50.0000 mg | ORAL_TABLET | Freq: Four times a day (QID) | ORAL | 0 refills | Status: DC | PRN

## 2016-07-28 NOTE — Discharge Summary (Addendum)
Physician Discharge Summary  [name] UEA:[id number] DOB: 10-07-1940 DOA: 07/25/2016  PCP: Lemont Fillers., NP  Admit date: 07/25/2016 Discharge date: 07/28/2016  Time spent: 45 minutes  Recommendations for Outpatient Follow-up:  1. No aspirin or coumadin until 07/31/16-suggest keeping INR in a range of 2-2.5 as will also be on aspirin.  Would start on a dose of 2 mg daily on 07/31/16 2. Recheck labs cbc + bmet in 1 week 3. Suggest OP neurology follow-up.  Names of neurologists provided to patient and family 4. Will need skilled level care on d/c 5. Should follow with cardiologist or pcp on d/c from SNF  Discharge Diagnoses:  Principal Problem:   Rectus sheath hematoma, initial encounter Active Problems:   Essential hypertension   Memory loss   Chronic systolic congestive heart failure, NYHA class 3 (HCC)   CKD (chronic kidney disease), stage III   Chronic atrial fibrillation (HCC)   Chronic anticoagulation- Coumadin   Chronic hypoxemic respiratory failure (HCC)   Acute kidney injury (HCC)   Supratherapeutic INR   Acute blood loss anemia   Discharge Condition: fair  Diet recommendation: hh low salt  Filed Weights   07/26/16 0034 07/27/16 2115 07/28/16 0500  Weight: 94.1 kg (207 lb 7.3 oz) 94.7 kg (208 lb 12.4 oz) 94.7 kg (208 lb 12.4 oz)    History of present illness:  75 y/o ? 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 Arterial embolism to LLE -Patch angioplasty of popliteal and post tibial artery 02/15/12 IVC filter placed 2002 post dvt Perm Afib since oct 2001, CHad2Vasc2=7 on Coumadin  Admitted with Fall 1 week prior and then increasin Flank pain Came to Ed and found to  have Rectus sheath Hematoma   Hospital Course:  Rectus sheath hematoma -holding coumadin and ASA at this time INR subtx at 1.5 Will need to hold off of the same probably until 07/31/16 and then resume PAin management with tramadol, Rx given on d/c  Prior NICM prior EF20%, improved 50-55% last echo Chr Sys HF Cont amlodipine 10 od, bisoprolol 10 bid  Lisinopril 10 on hold and held on d/c Lasix 40 qd on hold onn d/c  AKI Initial BUn/creat 64/2.0--Currently 30/1.3 HOld Lisninopril  Asymptomatic bacteriuria NO fever nor chills nor white count Apparently just Rx for this as well Hold off on Rx the same  Mod Aov and AoV root repair 2013 See above  Prior arterial embolism 2013 DVT at same time s/p filter Perm Afib since 2001 Chad2Vasc2=7 -coumadin and ASa on hold as above  Gout Cont allopurinol 100 bid  HLD Cont crestor 5 daily-would increase dose to 20 mg on d/c   Memory loss Long discussion about 10 min at bedside 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  No rales ABd tenderness is present, Mild flank pain on the L is present as well  Discharge Instructions    Current  Discharge Medication List    CONTINUE these medications which have CHANGED   Details  amLODipine (NORVASC) 10 MG tablet Take 1 tablet (10 mg total) by mouth daily. Qty: 90 tablet, Refills: 3    rosuvastatin (CRESTOR) 5 MG tablet Take 1 tablet (5 mg total) by mouth daily.    traMADol (ULTRAM) 50 MG tablet Take 1 tablet (50 mg total) by mouth 4 (four) times daily as needed for moderate pain. Qty: 30 tablet, Refills: 0      CONTINUE these medications which  have NOT CHANGED   Details  acetaminophen (TYLENOL) 500 MG tablet Take 500 mg by mouth every 6 (six) hours as needed for moderate pain.    allopurinol (ZYLOPRIM) 100 MG tablet TAKE 1 TABLET (100 MG TOTAL) BY MOUTH 2 (TWO) TIMES DAILY. Qty: 180 tablet, Refills: 3    bisoprolol (ZEBETA) 10 MG tablet Take 1 tablet (10 mg total) by mouth 2 (two) times daily. Qty: 180 tablet, Refills: 3    senna-docusate (SENOKOT S) 8.6-50 MG per tablet Take 2 tablets by mouth at bedtime. For constipation, also available OTC Qty: 60 tablet, Refills: 3      STOP taking these medications     [facility] ASPIRIN 81 MG EC tablet      furosemide (LASIX) 40 MG tablet      lisinopril (PRINIVIL,ZESTRIL) 10 MG tablet      potassium chloride SA (KLOR-CON M20) 20 MEQ tablet      warfarin (COUMADIN) 5 MG tablet        Allergies  Allergen Reactions  . Adhesive [Tape] Itching, Rash and Other (See Comments)    Redness and skin breaks down  . Latex Rash  . Neomycin-Bacitracin Zn-Polymyx Rash   Follow-up Information    Anson FretAhern, Antonia B, MD. Schedule an appointment as soon as possible for a visit in 1 month(s).   Specialty:  Neurology Contact information: [address] [place] [address] [phone]            The results of significant diagnostics from this hospitalization (including imaging, microbiology, ancillary and laboratory) are listed below for reference.    Significant Diagnostic Studies: Ct Abdomen Pelvis Wo Contrast  Result Date: 07/25/2016 CLINICAL DATA:  Lower abdominal pain for 5 days. EXAM: CT ABDOMEN AND PELVIS WITHOUT CONTRAST TECHNIQUE: Multidetector CT imaging of the abdomen and pelvis was performed following the standard protocol without IV contrast. COMPARISON:  CT scan of June 15, 2015. FINDINGS: Lower chest: Visualized lung bases are unremarkable. Hepatobiliary: Large solitary gallstone is again noted without evidence of inflammation. No focal abnormality is noted in  the liver on these unenhanced images. Pancreas: Normal. Spleen: Normal. Adrenals/Urinary Tract: Adrenal glands appear normal. Bilateral renal atrophy is noted. No hydronephrosis or renal obstruction is noted. No renal or ureteral calculi are noted. Stomach/Bowel: There is no evidence of bowel obstruction. Vascular/Lymphatic: Atherosclerosis of abdominal aorta is noted without aneurysm formation. Filter is noted in infrarenal IVC. No significant adenopathy is noted. Reproductive: Calcified uterine fibroid is noted. Ovaries are unremarkable. Other: There is interval development of moderate size left rectal sheath hematoma. Musculoskeletal: Severe multilevel degenerative disc disease is noted in the visualized thoracic and lumbar spine. IMPRESSION: Large solitary gallstone is again noted without inflammation. Aortic atherosclerosis. Calcified uterine fibroid. Bilateral renal atrophy with cortical scarring is noted. Interval development of moderate size left rectal sheath hematoma. Electronically Signed   By: Lupita RaiderJames  Green Jr, M.D.   On: 07/25/2016 21:06   Dg Chest 2 View  Result Date: 07/25/2016 CLINICAL DATA:  Shortness of breath for 1 week EXAM: CHEST  2 VIEW COMPARISON:  Chest CT 07/25/2014 FINDINGS: Single lead left chest wall AICD is present with lead terminating in the right ventricle. A left atrial appendage clip is noted. Prosthetic aortic valve. Incompletely visualized IVC filter. Atherosclerotic calcification aortic arch. No focal airspace disease or pulmonary edema. No pneumothorax or pleural effusion. IMPRESSION: No active cardiopulmonary disease.  Aortic atherosclerosis. Electronically Signed   By: Deatra Robinson M.D.   On: 07/25/2016 17:43   Dg Ankle Complete Right  Result Date: 07/08/2016 CLINICAL DATA:  Fall last night, right knee pain, right foot and ankle pain. EXAM: RIGHT ANKLE - COMPLETE 3+ VIEW COMPARISON:  None. FINDINGS: Diffuse soft tissue swelling about the right ankle. There is a  nondisplaced fracture noted at the base of the right fifth metatarsal. No acute bony abnormality in the ankle. Plantar calcaneal spur present. IMPRESSION: Nondisplaced fracture at the base of the right fifth metatarsal. Electronically Signed   By: Charlett Nose M.D.   On: 07/08/2016 12:24   US Venous Img Lower Bilateral  Result Date: 07/08/2016 CLINICAL DATA:  Bilateral lower leg swelling.  Recent travel. EXAM: BILATERAL LOWER EXTREMITY VENOUS DOPPLER ULTRASOUND TECHNIQUE: Gray-scale sonography with graded compression, as well as color Doppler and duplex ultrasound were performed to evaluate the lower extremity deep venous systems from the level of the common femoral vein and including the common femoral, femoral, profunda femoral, popliteal and calf veins including the posterior tibial, peroneal and gastrocnemius veins when visible. The superficial great saphenous vein was also interrogated. Spectral Doppler was utilized to evaluate flow at rest and with distal augmentation maneuvers in the common femoral, femoral and popliteal veins. COMPARISON:  09/05/2009 FINDINGS: RIGHT LOWER EXTREMITY Normal compressibility, augmentation and color Doppler flow in the right common femoral vein, right femoral vein and right popliteal vein. The right saphenofemoral junction is patent. Right profunda femoral vein is patent without thrombus. Visualized right deep calf veins are patent without thrombus. Right great saphenous vein is patent. LEFT LOWER EXTREMITY Normal compressibility, augmentation and color Doppler flow in the left common femoral vein, left femoral vein and left popliteal vein. The left saphenofemoral junction is patent. Left profunda femoral vein is patent without thrombus. Visualized left deep calf veins are patent without thrombus. Left great saphenous vein is compressible. IMPRESSION: No evidence of deep venous thrombosis. Electronically Signed   By: Richarda Overlie M.D.   On: 07/08/2016 11:47   Dg Knee  Complete 4 Views Right  Result Date: 07/08/2016 CLINICAL DATA:  Fall last night, knee pain. EXAM: RIGHT KNEE - COMPLETE 4+ VIEW COMPARISON:  None. FINDINGS: Mild degenerative changes in the medial and patellofemoral compartments with joint space narrowing and spurring. No acute bony abnormality. Specifically, no fracture, subluxation, or dislocation. Soft tissues are intact. No joint effusion. IMPRESSION: No acute bony abnormality. Electronically Signed   By: Charlett Nose M.D.   On: 07/08/2016 12:24   Dg Foot Complete Right  Result Date: 07/08/2016 CLINICAL DATA:  Recent fall with right foot and ankle pain. EXAM: RIGHT FOOT COMPLETE - 3+ VIEW COMPARISON:  06/03/2005 FINDINGS: Minimally displaced fracture at the base of the fifth metatarsal bone. Minimally displaced transverse fracture through the lateral malleolus. Prominent plantar calcaneal spur. Cannot exclude diffuse soft tissue swelling in the ankle and foot. IMPRESSION: Minimally displaced fractures involving the distal fibula and the base of the fifth metatarsal bone. Electronically Signed   By: Meriel Pica.D.  On: 07/08/2016 12:24    Microbiology: Recent Results (from the past 240 hour(s))  Urine Culture     Status: None   Collection Time: 07/18/16  3:34 PM  Result Value Ref Range Status   Organism ID, Bacteria   Final    Three or more organisms present,each greater than 10,000 CFU/mL.These organisms,commonly found on external and internal genitalia,are considered to be colonizers.No further testing performed.   Urine culture     Status: Abnormal (Preliminary result)   Collection Time: 07/25/16  6:18 PM  Result Value Ref Range Status   Specimen Description URINE, CLEAN CATCH  Final   Special Requests Normal  Final   Culture >=100,000 COLONIES/mL PSEUDOMONAS AERUGINOSA (A)  Final   Report Status PENDING  Incomplete  Culture, Urine     Status: Abnormal (Preliminary result)   Collection Time: 07/26/16  2:42 AM  Result Value Ref  Range Status   Specimen Description URINE, RANDOM  Final   Special Requests NONE  Final   Culture >=100,000 COLONIES/mL PSEUDOMONAS AERUGINOSA (A)  Final   Report Status PENDING  Incomplete     Labs: Basic Metabolic Panel:  Recent Labs Lab 07/25/16 1805 07/26/16 0504 07/27/16 0557 07/28/16 0611  NA 140 141 142 142  K 4.9 4.2 4.3 4.4  CL 103 105 108 107  CO2 28 25 25 29   GLUCOSE 104* 78 81 92  BUN 64* 48* 30* 27*  CREATININE 2.04* 1.65* 1.31* 1.49*  CALCIUM 8.9 8.8* 9.0 8.8*   Liver Function Tests:  Recent Labs Lab 07/25/16 1805  AST 20  ALT 12*  ALKPHOS 75  BILITOT 0.5  PROT 7.6  ALBUMIN 3.5    Recent Labs Lab 07/25/16 1805  LIPASE 43   No results for input(s): AMMONIA in the last 168 hours. CBC:  Recent Labs Lab 07/25/16 1805 07/26/16 0504 07/27/16 0557 07/28/16 0611  WBC 7.3 6.6 6.5 6.9  NEUTROABS 4.1  --  3.6 3.5  HGB 11.4* 11.9* 10.6* 10.7*  HCT 36.4 37.2 34.2* 34.3*  MCV 97.3 94.9 97.4 96.1  PLT 287 238 248 249   Cardiac Enzymes: No results for input(s): CKTOTAL, CKMB, CKMBINDEX, TROPONINI in the last 168 hours. BNP: BNP (last 3 results)  Recent Labs  07/25/16 1805  BNP 217.2*    ProBNP (last 3 results)  Recent Labs  07/08/16 1009  PROBNP 275.0*    CBG: No results for input(s): GLUCAP in the last 168 hours.     SignedRhetta Mura MD   [facility] 07/28/2016, 8:38 AM

## 2016-07-28 NOTE — Discharge Instructions (Signed)

## 2016-07-28 NOTE — Clinical Social Work Placement (Signed)
   CLINICAL SOCIAL WORK PLACEMENT  NOTE 07/28/16 - DISCHARGED TO [facility] TRANSPORTED BY FAMILY  Date:  07/28/2016  Patient Details  Name: [name] MRN: [id number] Date of Birth: 06/19/1941  Clinical Social Work is seeking post-discharge placement for this patient at the Skilled  Nursing Facility level of care (*CSW will initial, date and re-position this form in  chart as items are completed):  Yes   Patient/family provided with [facility] Clinical Social Work Department's list of facilities offering this level of care within the geographic area requested by the patient (or if unable, by the patient's family).  Yes   Patient/family informed of their freedom to choose among providers that offer the needed level of care, that participate in Medicare, Medicaid or managed care program needed by the patient, have an available bed and are willing to accept the patient.  Yes   Patient/family informed of [facility]'s ownership interest in [facility] and [facility], as well as of the fact that they are under no obligation to receive care at these facilities.  PASRR submitted to EDS on       PASRR number received on 07/27/16     Existing PASRR number confirmed on       FL2 transmitted to all facilities in geographic area requested by pt/family on 07/27/16     FL2 transmitted to all facilities within larger geographic area on       Patient informed that his/her managed care company has contracts with or will negotiate with certain facilities, including the following:         07/28/16 - Patient/family informed of bed offers received.  Patient chooses bed at  [facility]     Physician recommends and patient chooses bed at      Patient to be transferred to  [facility] on  07/28/16.  Patient to be transferred to facility by  daughters [name] and [name].     Patient family notified on  07/28/16 of transfer.  Name of family member notified:   Daughters [name] and  [name] at the bedside.     PHYSICIAN       Additional Comment:    _______________________________________________ Cristobal Goldmann, LCSW 07/28/2016, 5:46 PM

## 2016-07-28 NOTE — Progress Notes (Signed)
Physical Therapy Treatment Patient Details Name: Heather AmmonsJeritta R [name] MRN: [id number] DOB: 08/17/1941 Today's Date: 07/28/2016    History of Present Illness Patient is a 75 yo female admitted 07/25/16 with LLQ abdominal pain.  Patient with rectus sheath hematoma, supratherapeutic INR.  Patient with recent fall with Rt foot fx in walking boot, recent UTI, and recent memory loss per daughter.     PMH:  mild dementia, CKD, CHF, HTN, NICM, ICD, CVA, Afib, frequent falls    PT Comments    Progressing steadily with mobility.  Still mildly antalgic in nature and needing stability assist.  Follow Up Recommendations  SNF;Supervision/Assistance - 24 hour     Equipment Recommendations  Other (comment) (TBA at next venue)    Recommendations for Other Services       Precautions / Restrictions Precautions Precautions: Fall Other Brace/Splint: Walking boot Rt LE    Mobility  Bed Mobility Overal bed mobility: Needs Assistance Bed Mobility: Sidelying to Sit;Sit to Supine   Sidelying to sit: Min assist   Sit to supine: Min assist   General bed mobility comments: assist/cuing to get pt to roll over onto R UE to decrease pain  Transfers Overall transfer level: Needs assistance Equipment used: Rolling walker (2 wheeled) Transfers: Sit to/from Stand Sit to Stand: Mod assist         General transfer comment: Verbal cues for hand placement.  Mod assist to power up and help come forward.  Ambulation/Gait Ambulation/Gait assistance: Min assist Ambulation Distance (Feet): 110 Feet Assistive device: Rolling walker (2 wheeled) Gait Pattern/deviations: Step-through pattern Gait velocity: decreased Gait velocity interpretation: Below normal speed for age/gender General Gait Details: slow and antalgic in nature.  min steady assist   Stairs            Wheelchair Mobility    Modified Rankin (Stroke Patients Only)       Balance Overall balance assessment: Needs  assistance Sitting-balance support: No upper extremity supported Sitting balance-Leahy Scale: Fair     Standing balance support: Bilateral upper extremity supported Standing balance-Leahy Scale: Poor                      Cognition Arousal/Alertness: Awake/alert Behavior During Therapy: WFL for tasks assessed/performed Overall Cognitive Status: History of cognitive impairments - at baseline                      Exercises      General Comments        Pertinent Vitals/Pain Pain Assessment: Faces Faces Pain Scale: Hurts even more Pain Location: L lower abdomen Pain Descriptors / Indicators: Grimacing;Guarding Pain Intervention(s): Monitored during session;Repositioned    Home Living                      Prior Function            PT Goals (current goals can now be found in the care plan section) Acute Rehab PT Goals Patient Stated Goal: To get stronger PT Goal Formulation: With patient/family Time For Goal Achievement: 08/09/16 Potential to Achieve Goals: Good Progress towards PT goals: Progressing toward goals    Frequency    Min 3X/week      PT Plan Current plan remains appropriate    Co-evaluation             End of Session   Activity Tolerance: Patient limited by pain Patient left: in bed;with call bell/phone within reach;with bed alarm set;with  family/visitor present     Time: 1657-9038 PT Time Calculation (min) (ACUTE ONLY): 22 min  Charges:  $Gait Training: 8-22 mins                    G Codes:      [name], [name] 07/28/2016, 5:31 PM

## 2016-07-28 NOTE — Progress Notes (Signed)
Report called to [place] at [facility].  All questions answered.

## 2016-07-28 NOTE — Progress Notes (Signed)
Following peripherally for warfarin resumption.  Spoke with Ms. Nola and her daughter in the room regarding warfarin. Stated she is going to [facility] for rehab upon discharge from the hospital.  Plan: -restart warfarin 2.5mg  daily on 07/31/2016 -recommend INR check on 08/02/2016 if able -patient will need to be watched closely- agree with recommended goal of 2-2.5 mentioned in Dr. Pandora Leiter note   Shayan Bramhall D. Alexxus Sobh, PharmD, BCPS Clinical Pharmacist Pager: [phone] 07/28/2016 3:00 PM

## 2016-07-29 ENCOUNTER — Encounter: Payer: Self-pay | Admitting: Internal Medicine

## 2016-07-29 ENCOUNTER — Non-Acute Institutional Stay (SKILLED_NURSING_FACILITY): Payer: Medicare Other | Admitting: Internal Medicine

## 2016-07-29 DIAGNOSIS — K5909 Other constipation: Secondary | ICD-10-CM

## 2016-07-29 DIAGNOSIS — E784 Other hyperlipidemia: Secondary | ICD-10-CM | POA: Diagnosis not present

## 2016-07-29 DIAGNOSIS — D62 Acute posthemorrhagic anemia: Secondary | ICD-10-CM

## 2016-07-29 DIAGNOSIS — K5900 Constipation, unspecified: Secondary | ICD-10-CM | POA: Diagnosis not present

## 2016-07-29 DIAGNOSIS — I10 Essential (primary) hypertension: Secondary | ICD-10-CM | POA: Diagnosis not present

## 2016-07-29 DIAGNOSIS — I5022 Chronic systolic (congestive) heart failure: Secondary | ICD-10-CM | POA: Diagnosis not present

## 2016-07-29 DIAGNOSIS — J9611 Chronic respiratory failure with hypoxia: Secondary | ICD-10-CM | POA: Diagnosis not present

## 2016-07-29 DIAGNOSIS — Z952 Presence of prosthetic heart valve: Secondary | ICD-10-CM | POA: Diagnosis not present

## 2016-07-29 DIAGNOSIS — I482 Chronic atrial fibrillation, unspecified: Secondary | ICD-10-CM

## 2016-07-29 DIAGNOSIS — S301XXS Contusion of abdominal wall, sequela: Secondary | ICD-10-CM | POA: Diagnosis not present

## 2016-07-29 DIAGNOSIS — Z86718 Personal history of other venous thrombosis and embolism: Secondary | ICD-10-CM

## 2016-07-29 DIAGNOSIS — R531 Weakness: Secondary | ICD-10-CM | POA: Diagnosis not present

## 2016-07-29 DIAGNOSIS — N183 Chronic kidney disease, stage 3 unspecified: Secondary | ICD-10-CM

## 2016-07-29 DIAGNOSIS — E7849 Other hyperlipidemia: Secondary | ICD-10-CM

## 2016-07-29 NOTE — Progress Notes (Signed)
LOCATION: [facility]  PCP: Lemont Fillers., NP   Code Status: Full Code  Goals of care: Advanced Directive information Advanced Directives 07/25/2016  Does patient have an advance directive? Yes  Type of Advance Directive -  Does patient want to make changes to advanced directive? -  Copy of advanced directive(s) in chart? -  Would patient like information on creating an advanced directive? -  Pre-existing out of facility DNR order (yellow form or pink MOST form) -       Extended Emergency Contact Information Primary Emergency Contact: Sparrow,Kay Address: [address]          [place], [place] [address] [place] Home Phone: [phone] Work Phone: [phone] Mobile Phone: [phone] Relation: Daughter Secondary Emergency Contact: Bonita Quin Address: [address]          [place], [place] [address] [place] Mobile Phone: [phone] Relation: Grandaughter   Allergies  Allergen Reactions  . Adhesive [Tape] Itching, Rash and Other (See Comments)    Redness and skin breaks down  . Latex Rash  . Neomycin-Bacitracin Zn-Polymyx Rash    No chief complaint on file.    HPI:  Patient is a 75 y.o. female seen today for short term rehabilitation post hospital admission from 07/25/16-07/28/16 with rectus sheath hematoma. Her anticoagulation was discontinued and her aspirin was held. She has PMH of HTN, CHF, CKD, chronic afib, cva among others. She is seen in her room today.   Review of Systems:  Constitutional: Negative for fever, chills, diaphoresis. Gets tired easily. HENT: Negative for headache, congestion, nasal discharge, sore throat, difficulty swallowing.   Eyes: Negative for blurred vision, double vision and discharge.  Respiratory: Negative for cough, shortness of breath and wheezing.  on oxygen chronically.  Cardiovascular: Negative for chest pain, palpitations, leg swelling.  Gastrointestinal: Negative for heartburn,  nausea, vomiting,loss of appetite. Positive for LLQ abdominal discomfort. Last bowel movement was yesterday with somewhat loose stool, no bleed Genitourinary: Negative for dysuria, hematuria and flank pain.  Musculoskeletal: Negative for back pain, fall in the facility. denies pain at present to her leg Skin: Negative for itching, rash.  Neurological: Negative for dizziness. Psychiatric/Behavioral: Negative for depression   Past Medical History:  Diagnosis Date  . A-fib [facility])    Not on coumadin due to intracebral hemorrhage  . Anemia   . Anxiety   . Aortic insufficiency    s/p tissue AVR 2013  . 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 Coronary artery disease 2013   non obstructive  . DOE (dyspnea on exertion)   . DVT (deep venous thrombosis) (HCC)   . Exogenous obesity   . Frequent falls   . Frequent UTI   . Heart murmur    prior AVR with asacending aortic root replacement August 2013  . History of CHF (congestive heart failure)   . History of gout   . History of ventricular tachycardia   . Hypertension   . Myocardial infarction 2000  . Osteopenia 09/01/2013  . Peripheral edema   . Presence of IVC filter 2000  . Renal insufficiency   . Stroke [facility]) 2000   Past Surgical History:  Procedure Laterality Date  . AORTIC VALVE REPLACEMENT  04/12/2012   Procedure: AORTIC VALVE REPLACEMENT (AVR);  Surgeon: Delight Ovens, MD;  Location: [facility] OR;  Service: Open Heart Surgery;  Laterality: N/A;  with nitric oxide  . CARDIAC CATHETERIZATION  7/13   non obstructive CAD  . CARDIAC DEFIBRILLATOR PLACEMENT  2013   MDT ICD  . CATARACT EXTRACTION    . clot removal  5.2013   blood clot removed from the back of the left leg  . EMBOLECTOMY  02/15/2012   Procedure: EMBOLECTOMY;  Surgeon: Chuck Hinthristopher S Dickson, MD;  [facility];  Service: Vascular;  Laterality: Left;  left popliteal embolectomy with drain placement  . EYE SURGERY  2012   removal  of cataracts bilaterally  . LEFT HEART CATHETERIZATION WITH CORONARY ANGIOGRAM N/A 04/08/2012   Procedure: LEFT HEART CATHETERIZATION WITH CORONARY ANGIOGRAM;  Surgeon: Tonny BollmanMichael Cooper, MD;  [facility];  Service: Cardiovascular;  Laterality: N/A;  . STOMACH SURGERY     lap band placed and later removed  . THORACIC AORTIC ANEURYSM REPAIR  04/12/2012   Procedure: THORACIC ASCENDING ANEURYSM REPAIR (AAA);  Surgeon: Delight OvensEdward B Gerhardt, MD;  [facility];  Service: Open Heart Surgery;  Laterality: N/A;  . TONSILLECTOMY     Social History:   reports that she has never smoked. She has never used smokeless tobacco. She reports that she does not drink alcohol or use drugs.  Family History  Problem Relation Age of Onset  . Hypertension Father   . Cancer Father   . Coronary artery disease Father [id number]  . Cancer Son     pancreas, spine    Medications:   Medication List       Accurate as of 07/29/16  2:45 PM. Always use your most recent med list.          acetaminophen 500 MG tablet Commonly known as:  TYLENOL Take 500 mg by mouth every 6 (six) hours as needed for moderate pain.   allopurinol 100 MG tablet Commonly known as:  ZYLOPRIM TAKE 1 TABLET (100 MG TOTAL) BY MOUTH 2 (TWO) TIMES DAILY.   amLODipine 10 MG tablet Commonly known as:  NORVASC Take 1 tablet (10 mg total) by mouth daily.   bisoprolol 10 MG tablet Commonly known as:  ZEBETA Take 1 tablet (10 mg total) by mouth 2 (two) times daily.   rosuvastatin 5 MG tablet Commonly known as:  CRESTOR Take 1 tablet (5 mg total) by mouth daily.   senna-docusate 8.6-50 MG tablet Commonly known as:  SENOKOT S Take 2 tablets by mouth at bedtime. For constipation, also available OTC   traMADol 50 MG tablet Commonly known as:  ULTRAM Take 50 mg by mouth every 6 (six) hours as needed for moderate pain.       Immunizations: Immunization History  Administered Date(s) Administered  . Influenza Split 07/08/2012  .  Influenza Whole 08/14/2008, 05/29/2009, 06/17/2010  . Influenza,inj,Quad PF,36+ Mos 06/21/2013  . Influenza-Unspecified 05/29/2014, 06/05/2015, 05/28/2016  . PPD Test 03/27/2014  . Pneumococcal Conjugate-13 09/14/2013  . Pneumococcal Polysaccharide-23 01/19/2006, 07/08/2012  . Td 01/15/2004, 09/11/2015  . Zoster 07/11/2013     Physical Exam:  Vitals:   07/29/16 1441  BP: 133/71  Pulse: 68  Resp: 16  Temp: (!) 96.7 F (35.9 C)  TempSrc: Oral  SpO2: 93%  Weight: 207 lb (93.9 kg)  Height: 5\' 2"  (1.575 m)   Body mass index is 37.86 kg/m.  General- elderly female, obese, in no acute distress Head- normocephalic, atraumatic Nose- no maxillary or frontal sinus tenderness, no nasal discharge Throat- moist mucus membrane  Eyes- PERRLA, EOMI, no pallor, no icterus, no discharge, normal conjunctiva, normal sclera Neck- no cervical lymphadenopathy Cardiovascular- normal s1,s2, no murmur, trace leg edema Respiratory- bilateral clear to auscultation, no wheeze, no rhonchi,  no crackles, no use of accessory muscles Abdomen- bowel sounds present, soft, non tender Musculoskeletal- able to move all 4 extremities, generalized weakness, right lower leg in brace Neurological- alert and oriented to person, place and time Skin- warm and dry, easy bruising Psychiatry- normal mood and affect    Labs reviewed: Basic Metabolic Panel:  Recent Labs  69/62/95 0504 07/27/16 0557 07/28/16 0611  NA 141 142 142  K 4.2 4.3 4.4  CL 105 108 107  CO2 25 25 29   GLUCOSE 78 81 92  BUN 48* 30* 27*  CREATININE 1.65* 1.31* 1.49*  CALCIUM 8.8* 9.0 8.8*   Liver Function Tests:  Recent Labs  07/08/16 1009 07/25/16 1805  AST 18 20  ALT 12 12*  ALKPHOS 68 75  BILITOT 0.4 0.5  PROT 7.3 7.6  ALBUMIN 3.8 3.5    Recent Labs  07/25/16 1805  LIPASE 43   No results for input(s): AMMONIA in the last 8760 hours. CBC:  Recent Labs  07/25/16 1805 07/26/16 0504 07/27/16 0557 07/28/16 0611    WBC 7.3 6.6 6.5 6.9  NEUTROABS 4.1  --  3.6 3.5  HGB 11.4* 11.9* 10.6* 10.7*  HCT 36.4 37.2 34.2* 34.3*  MCV 97.3 94.9 97.4 96.1  PLT 287 238 248 249   Cardiac Enzymes: No results for input(s): CKTOTAL, CKMB, CKMBINDEX, TROPONINI in the last 8760 hours. BNP: Invalid input(s): POCBNP CBG: No results for input(s): GLUCAP in the last 8760 hours.  Radiological Exams: Ct Abdomen Pelvis Wo Contrast  Result Date: 07/25/2016 CLINICAL DATA:  Lower abdominal pain for 5 days. EXAM: CT ABDOMEN AND PELVIS WITHOUT CONTRAST TECHNIQUE: Multidetector CT imaging of the abdomen and pelvis was performed following the standard protocol without IV contrast. COMPARISON:  CT scan of June 15, 2015. FINDINGS: Lower chest: Visualized lung bases are unremarkable. Hepatobiliary: Large solitary gallstone is again noted without evidence of inflammation. No focal abnormality is noted in the liver on these unenhanced images. Pancreas: Normal. Spleen: Normal. Adrenals/Urinary Tract: Adrenal glands appear normal. Bilateral renal atrophy is noted. No hydronephrosis or renal obstruction is noted. No renal or ureteral calculi are noted. Stomach/Bowel: There is no evidence of bowel obstruction. Vascular/Lymphatic: Atherosclerosis of abdominal aorta is noted without aneurysm formation. Filter is noted in infrarenal IVC. No significant adenopathy is noted. Reproductive: Calcified uterine fibroid is noted. Ovaries are unremarkable. Other: There is interval development of moderate size left rectal sheath hematoma. Musculoskeletal: Severe multilevel degenerative disc disease is noted in the visualized thoracic and lumbar spine. IMPRESSION: Large solitary gallstone is again noted without inflammation. Aortic atherosclerosis. Calcified uterine fibroid. Bilateral renal atrophy with cortical scarring is noted. Interval development of moderate size left rectal sheath hematoma. Electronically Signed   By: Lupita Raider, M.D.   On:  07/25/2016 21:06   Dg Chest 2 View  Result Date: 07/25/2016 CLINICAL DATA:  Shortness of breath for 1 week EXAM: CHEST  2 VIEW COMPARISON:  Chest CT 07/25/2014 FINDINGS: Single lead left chest wall AICD is present with lead terminating in the right ventricle. A left atrial appendage clip is noted. Prosthetic aortic valve. Incompletely visualized IVC filter. Atherosclerotic calcification aortic arch. No focal airspace disease or pulmonary edema. No pneumothorax or pleural effusion. IMPRESSION: No active cardiopulmonary disease.  Aortic atherosclerosis. Electronically Signed   By: Deatra Robinson M.D.   On: 07/25/2016 17:43   Dg Ankle Complete Right  Result Date: 07/08/2016 CLINICAL DATA:  Fall last night, right knee pain, right foot and ankle pain. EXAM: RIGHT  ANKLE - COMPLETE 3+ VIEW COMPARISON:  None. FINDINGS: Diffuse soft tissue swelling about the right ankle. There is a nondisplaced fracture noted at the base of the right fifth metatarsal. No acute bony abnormality in the ankle. Plantar calcaneal spur present. IMPRESSION: Nondisplaced fracture at the base of the right fifth metatarsal. Electronically Signed   By: Charlett Nose M.D.   On: 07/08/2016 12:24   US Venous Img Lower Bilateral  Result Date: 07/08/2016 CLINICAL DATA:  Bilateral lower leg swelling.  Recent travel. EXAM: BILATERAL LOWER EXTREMITY VENOUS DOPPLER ULTRASOUND TECHNIQUE: Gray-scale sonography with graded compression, as well as color Doppler and duplex ultrasound were performed to evaluate the lower extremity deep venous systems from the level of the common femoral vein and including the common femoral, femoral, profunda femoral, popliteal and calf veins including the posterior tibial, peroneal and gastrocnemius veins when visible. The superficial great saphenous vein was also interrogated. Spectral Doppler was utilized to evaluate flow at rest and with distal augmentation maneuvers in the common femoral, femoral and popliteal  veins. COMPARISON:  09/05/2009 FINDINGS: RIGHT LOWER EXTREMITY Normal compressibility, augmentation and color Doppler flow in the right common femoral vein, right femoral vein and right popliteal vein. The right saphenofemoral junction is patent. Right profunda femoral vein is patent without thrombus. Visualized right deep calf veins are patent without thrombus. Right great saphenous vein is patent. LEFT LOWER EXTREMITY Normal compressibility, augmentation and color Doppler flow in the left common femoral vein, left femoral vein and left popliteal vein. The left saphenofemoral junction is patent. Left profunda femoral vein is patent without thrombus. Visualized left deep calf veins are patent without thrombus. Left great saphenous vein is compressible. IMPRESSION: No evidence of deep venous thrombosis. Electronically Signed   By: Richarda Overlie M.D.   On: 07/08/2016 11:47   Dg Knee Complete 4 Views Right  Result Date: 07/08/2016 CLINICAL DATA:  Fall last night, knee pain. EXAM: RIGHT KNEE - COMPLETE 4+ VIEW COMPARISON:  None. FINDINGS: Mild degenerative changes in the medial and patellofemoral compartments with joint space narrowing and spurring. No acute bony abnormality. Specifically, no fracture, subluxation, or dislocation. Soft tissues are intact. No joint effusion. IMPRESSION: No acute bony abnormality. Electronically Signed   By: Charlett Nose M.D.   On: 07/08/2016 12:24   Dg Foot Complete Right  Result Date: 07/08/2016 CLINICAL DATA:  Recent fall with right foot and ankle pain. EXAM: RIGHT FOOT COMPLETE - 3+ VIEW COMPARISON:  06/03/2005 FINDINGS: Minimally displaced fracture at the base of the fifth metatarsal bone. Minimally displaced transverse fracture through the lateral malleolus. Prominent plantar calcaneal spur. Cannot exclude diffuse soft tissue swelling in the ankle and foot. IMPRESSION: Minimally displaced fractures involving the distal fibula and the base of the fifth metatarsal bone.  Electronically Signed   By: Richarda Overlie M.D.   On: 07/08/2016 12:24    Assessment/Plan  Generalized weakness Will have her work with physical therapy and occupational therapy team to help with gait training and muscle strengthening exercises.fall precautions. Skin care. Encourage to be out of bed.   Rectus sheath hematoma Her aspirin and coumadin are on hold at present. Monitor clinically  Blood loss anemia With rectus sheath hematoma, monitor cbc  afib Rate controlled. Continue bisoprolol and statin. coumadin on hold for above reason. Coumadin to be resumed on 07/31/16  Right foot fracture Has non displaced fracture of base of right 5th metatarsal. S/p cam boot and continue tylenol 500 mg q6h prn with tramadol 50 mg q6h prn  for pain  CHF systolic With EF 16%. Continue bisoprolol 10 mg bid. Off lasix and lisinopril this hospitalization with renal impairment. Check bmp  ARF on ckd 3 Monitor bmp  History of DVT S/p IVC filter. Her coumadin is on hold at present  Constipation Currently on senokot s 2 tab qhs, change this to 1 tab po qhs prn and monitor  HLD Continue crestor  HTN Monitor BP, continue amlodipine and bisoprolol  Chronic respiratory failure Continue o2 by nasal canula and monitor  History of AVR Coumadin on hold now, resume from 07/31/16 with goal inr 2.5-3.5   Goals of care: short term rehabilitation   Labs/tests ordered: cbc, bmp 07/30/16.   Family/ staff Communication: reviewed care plan with patient and nursing supervisor    Oneal Grout, MD Internal Medicine [facility] [address] [place], [place] [address] Cell Phone (Monday-Friday 8 am - 5 pm): [phone] On Call: [phone] and follow prompts after 5 pm and on weekends Office Phone: [phone] Office Fax: [phone]

## 2016-07-30 LAB — BASIC METABOLIC PANEL
BUN: 21 mg/dL (ref 4–21)
CREATININE: 0.9 mg/dL (ref 0.5–1.1)
Glucose: 84 mg/dL
POTASSIUM: 4.2 mmol/L (ref 3.4–5.3)
Sodium: 143 mmol/L (ref 137–147)

## 2016-07-30 LAB — CBC AND DIFFERENTIAL
HCT: 34 % — AB (ref 36–46)
HEMOGLOBIN: 10.7 g/dL — AB (ref 12.0–16.0)
Neutrophils Absolute: 3 /uL
Platelets: 255 10*3/uL (ref 150–399)
WBC: 6.7 10^3/mL

## 2016-07-30 LAB — HEPATIC FUNCTION PANEL
ALK PHOS: 78 U/L (ref 25–125)
ALT: 8 U/L (ref 7–35)
AST: 16 U/L (ref 13–35)
Bilirubin, Total: 0.6 mg/dL

## 2016-07-31 ENCOUNTER — Non-Acute Institutional Stay (SKILLED_NURSING_FACILITY): Payer: Medicare Other | Admitting: Adult Health

## 2016-07-31 ENCOUNTER — Encounter: Payer: Self-pay | Admitting: Adult Health

## 2016-07-31 DIAGNOSIS — Z86718 Personal history of other venous thrombosis and embolism: Secondary | ICD-10-CM

## 2016-07-31 DIAGNOSIS — Z952 Presence of prosthetic heart valve: Secondary | ICD-10-CM | POA: Diagnosis not present

## 2016-07-31 DIAGNOSIS — Z7901 Long term (current) use of anticoagulants: Secondary | ICD-10-CM | POA: Diagnosis not present

## 2016-07-31 DIAGNOSIS — I482 Chronic atrial fibrillation, unspecified: Secondary | ICD-10-CM

## 2016-07-31 NOTE — Progress Notes (Signed)
Patient ID: [name], female   DOB: 18-Jul-1941, 75 y.o.   MRN: [id number] Subjective:     Indication: atrial fibrillation , Hx of DVT and Hx of AVR Bleeding signs/symptoms: None Thromboembolic signs/symptoms: None  Missed Coumadin doses: Coumadin was on hold due to rectus sheath hematoma Medication changes: no Dietary changes: no Bacterial/viral infection: no Other concerns: no  The following portions of the patient's history were reviewed and updated as appropriate: allergies, current medications, past family history, past medical history, past social history, past surgical history and problem list.  Review of Systems A comprehensive review of systems was negative.   Objective:    INR Today: 1.2 Current dose: Coumadin was on hold since admission due to rectus sheath hematoma    Assessment:    Subtherapeutic INR for goal of 2-3   Plan:    1. New dose: start Coumadin 3 mg PO Q D since latest hgb 10.7   2. Next INR: 08/04/16

## 2016-08-06 ENCOUNTER — Ambulatory Visit: Payer: Medicare Other | Admitting: Cardiology

## 2016-08-07 LAB — CBC AND DIFFERENTIAL
HEMATOCRIT: 34 % — AB (ref 36–46)
HEMOGLOBIN: 10.9 g/dL — AB (ref 12.0–16.0)
PLATELETS: 220 10*3/uL (ref 150–399)
WBC: 6.2 10^3/mL

## 2016-08-08 ENCOUNTER — Telehealth: Payer: Self-pay | Admitting: Cardiology

## 2016-08-08 NOTE — Telephone Encounter (Signed)
New Message:    Pt needs a prescription to get a hospital bed,[name] will give you all the details.

## 2016-08-08 NOTE — Telephone Encounter (Signed)
[name] is aware per Dr Anne Fu, she should contact pt's PCP for these types of orders.

## 2016-08-11 ENCOUNTER — Encounter: Payer: Self-pay | Admitting: Adult Health

## 2016-08-11 ENCOUNTER — Non-Acute Institutional Stay (SKILLED_NURSING_FACILITY): Payer: Medicare Other | Admitting: Adult Health

## 2016-08-11 DIAGNOSIS — I482 Chronic atrial fibrillation, unspecified: Secondary | ICD-10-CM

## 2016-08-11 DIAGNOSIS — N390 Urinary tract infection, site not specified: Secondary | ICD-10-CM | POA: Diagnosis not present

## 2016-08-11 DIAGNOSIS — D62 Acute posthemorrhagic anemia: Secondary | ICD-10-CM

## 2016-08-11 NOTE — Progress Notes (Signed)
Patient ID: [name], female   DOB: 02-01-1941, 75 y.o.   MRN: [id number]    DATE:  08/11/16  MRN:  [id number]  BIRTHDAY: [birth year]  Facility:  Nursing Home Location:  [facility]  Nursing Home Room Number: 1103-A  LEVEL OF CARE:  SNF (31)  Contact Information    Name Relation Home Work Mobile   Sparrow,Kay Daughter [phone]   [address]   [phone]   Jonny Ruiz Daughter [phone]         Code Status History    Date Active Date Inactive Code Status Order ID Comments User Context   07/26/2016  1:52 AM 07/28/2016  9:13 PM DNR [id number]  Alberteen Sam, MD Inpatient   06/20/2014 12:45 AM 06/23/2014  3:50 PM Full Code [id number]  Laurey Morale, MD Inpatient   11/04/2013 10:57 PM 11/11/2013  6:06 PM Full Code [id number]  Quintella Reichert, MD Inpatient   08/08/2012 11:56 PM 08/10/2012  2:52 PM Full Code [id number]  Ron Parker, MD ED   04/12/2012  2:34 PM 04/18/2012  2:35 PM Full Code [id number]  Keitha Butte, RN Inpatient   04/06/2012  4:44 PM 04/12/2012  2:34 PM Full Code [id number]  Thayer Headings, RN Inpatient    Questions for Most Recent Historical Code Status (Order [id number])    Question Answer Comment   In the event of cardiac or respiratory ARREST Do not call a "code blue"    In the event of cardiac or respiratory ARREST Do not perform Intubation, CPR, defibrillation or ACLS    In the event of cardiac or respiratory ARREST Use medication by any route, position, wound care, and other measures to relive pain and suffering. May use oxygen, suction and manual treatment of airway obstruction as needed for comfort.       Chief Complaint  Patient presents with  . Acute Visit    UTI,  A. fib, Anemia    HISTORY OF PRESENT ILLNESS:  This is a 75 year old female who has complained of increased in urinary frequency, urgency and confusion. No fever has been noted. Urine culture obtained and showed >  100,000 CFU/ml E. Coli. He was recently re-started on Coumadin.  Coumadin was held due to rectus sheath hematoma.   He has been admitted to [facility] on 07/28/16 from [facility] hospitalization 07/25/16 to 07/28/16 with rectus sheath hematoma. Her anticoagulation was discontinued and her aspirin was held.  She is currently having a short-term rehabilitation.   PAST MEDICAL HISTORY:  Past Medical History:  Diagnosis Date  . A-fib [facility])    Not on coumadin due to intracebral hemorrhage  . Anemia   . Anxiety   . Aortic insufficiency    s/p tissue AVR 2013  . 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 Coronary artery disease 2013   non obstructive  . DOE (dyspnea on exertion)   . DVT (deep venous thrombosis) (HCC)   . Exogenous obesity   . Frequent falls   . Frequent UTI   . Heart murmur    prior AVR with asacending aortic root replacement August 2013  . History of CHF (congestive heart failure)   . History of gout   . History of ventricular tachycardia   . Hypertension   . Myocardial infarction 2000  . Osteopenia 09/01/2013  . Peripheral edema   . Presence of IVC filter 2000  . Renal insufficiency   . Stroke [facility]) 2000  CURRENT MEDICATIONS: Reviewed  Patient's Medications  New Prescriptions   No medications on file  Previous Medications   ACETAMINOPHEN (TYLENOL) 500 MG TABLET    Take 500 mg by mouth every 6 (six) hours as needed for moderate pain.   ALLOPURINOL (ZYLOPRIM) 100 MG TABLET    TAKE 1 TABLET (100 MG TOTAL) BY MOUTH 2 (TWO) TIMES DAILY.   AMLODIPINE (NORVASC) 10 MG TABLET    Take 1 tablet (10 mg total) by mouth daily.   ATORVASTATIN (LIPITOR) 20 MG TABLET    Take 20 mg by mouth daily.   BISOPROLOL (ZEBETA) 10 MG TABLET    Take 1 tablet (10 mg total) by mouth 2 (two) times daily.   CIPROFLOXACIN (CIPRO) 500 MG TABLET    Take 500 mg by mouth 2 (two) times daily.   SACCHAROMYCES BOULARDII (FLORASTOR) 250 MG  CAPSULE    Take 250 mg by mouth 2 (two) times daily.   SENNOSIDES-DOCUSATE SODIUM (SENOKOT-S) 8.6-50 MG TABLET    Take 1 tablet by mouth at bedtime as needed for constipation.   TRAMADOL (ULTRAM) 50 MG TABLET    Take 50 mg by mouth every 4 (four) hours as needed for moderate pain.   Modified Medications   No medications on file  Discontinued Medications   WARFARIN (COUMADIN) 4 MG TABLET    Take 4 mg by mouth daily.     Allergies  Allergen Reactions  . Adhesive [Tape] Itching, Rash and Other (See Comments)    Redness and skin breaks down  . Latex Rash  . Neomycin-Bacitracin Zn-Polymyx Rash     REVIEW OF SYSTEMS:  GENERAL: no change in appetite, no fatigue, no weight changes, no fever, chills or weakness EYES: Denies change in vision, dry eyes, eye pain, itching or discharge EARS: Denies change in hearing, ringing in ears, or earache NOSE: Denies nasal congestion or epistaxis MOUTH and THROAT: Denies oral discomfort, gingival pain or bleeding, pain from teeth or hoarseness   RESPIRATORY: no cough, SOB, DOE, wheezing, hemoptysis CARDIAC: no chest pain, edema or palpitations GI: no abdominal pain, diarrhea, constipation, heart burn, nausea or vomiting GU: Denies dysuria, frequency, hematuria, incontinence, or discharge PSYCHIATRIC: Denies feeling of depression or anxiety. No report of hallucinations, insomnia, paranoia, or agitation   PHYSICAL EXAMINATION  GENERAL APPEARANCE: Well nourished. In no acute distress. Obese SKIN:  Skin is warm and dry. Bruising on right side of abdomen and left arm HEAD: Normal in size and contour. No evidence of trauma EYES: Lids open and close normally. No blepharitis, entropion or ectropion. PERRL. Conjunctivae are clear and sclerae are white. Lenses are without opacity EARS: Pinnae are normal. Patient hears normal voice tunes of the examiner MOUTH and THROAT: Lips are without lesions. Oral mucosa is moist and without lesions. Tongue is normal in  shape, size, and color and without lesions NECK: supple, trachea midline, no neck masses, no thyroid tenderness, no thyromegaly LYMPHATICS: no LAN in the neck, no supraclavicular LAN RESPIRATORY: breathing is even & unlabored, BS CTAB; on O2 @ 2L/min via [place] continuously CARDIAC: RRR, no murmur,no extra heart sounds, no edema GI: abdomen soft, normal BS, no masses, no tenderness, no hepatomegaly, no splenomegaly EXTREMITIES:  Able to move 4 extremities; has right walking boot; right upper and lower extremity weakness due to Hx of stroke PSYCHIATRIC: Alert to self. Affect and behavior are appropriate  LABS/RADIOLOGY: Labs reviewed: Basic Metabolic Panel:  Recent Labs  16/06/9609/28/17 0504 07/27/16 0557 07/28/16 0611 07/30/16  NA 141 142 142  143  K 4.2 4.3 4.4 4.2  CL 105 108 107  --   CO2 25 25 29   --   GLUCOSE 78 81 92  --   BUN 48* 30* 27* 21  CREATININE 1.65* 1.31* 1.49* 0.9  CALCIUM 8.8* 9.0 8.8*  --    Liver Function Tests:  Recent Labs  07/08/16 1009 07/25/16 1805 07/30/16  AST 18 20 16   ALT 12 12* 8  ALKPHOS 68 75 78  BILITOT 0.4 0.5  --   PROT 7.3 7.6  --   ALBUMIN 3.8 3.5  --     Recent Labs  07/25/16 1805  LIPASE 43   CBC:  Recent Labs  07/26/16 0504 07/27/16 0557 07/28/16 0611 07/30/16 08/07/16  WBC 6.6 6.5 6.9 6.7 6.2  NEUTROABS  --  3.6 3.5 3  --   HGB 11.9* 10.6* 10.7* 10.7* 10.9*  HCT 37.2 34.2* 34.3* 34* 34*  MCV 94.9 97.4 96.1  --   --   PLT 238 248 249 255 220     Ct Abdomen Pelvis Wo Contrast  Result Date: 07/25/2016 CLINICAL DATA:  Lower abdominal pain for 5 days. EXAM: CT ABDOMEN AND PELVIS WITHOUT CONTRAST TECHNIQUE: Multidetector CT imaging of the abdomen and pelvis was performed following the standard protocol without IV contrast. COMPARISON:  CT scan of June 15, 2015. FINDINGS: Lower chest: Visualized lung bases are unremarkable. Hepatobiliary: Large solitary gallstone is again noted without evidence of inflammation. No focal  abnormality is noted in the liver on these unenhanced images. Pancreas: Normal. Spleen: Normal. Adrenals/Urinary Tract: Adrenal glands appear normal. Bilateral renal atrophy is noted. No hydronephrosis or renal obstruction is noted. No renal or ureteral calculi are noted. Stomach/Bowel: There is no evidence of bowel obstruction. Vascular/Lymphatic: Atherosclerosis of abdominal aorta is noted without aneurysm formation. Filter is noted in infrarenal IVC. No significant adenopathy is noted. Reproductive: Calcified uterine fibroid is noted. Ovaries are unremarkable. Other: There is interval development of moderate size left rectal sheath hematoma. Musculoskeletal: Severe multilevel degenerative disc disease is noted in the visualized thoracic and lumbar spine. IMPRESSION: Large solitary gallstone is again noted without inflammation. Aortic atherosclerosis. Calcified uterine fibroid. Bilateral renal atrophy with cortical scarring is noted. Interval development of moderate size left rectal sheath hematoma. Electronically Signed   By: Lupita Raider, M.D.   On: 07/25/2016 21:06   Dg Chest 2 View  Result Date: 07/25/2016 CLINICAL DATA:  Shortness of breath for 1 week EXAM: CHEST  2 VIEW COMPARISON:  Chest CT 07/25/2014 FINDINGS: Single lead left chest wall AICD is present with lead terminating in the right ventricle. A left atrial appendage clip is noted. Prosthetic aortic valve. Incompletely visualized IVC filter. Atherosclerotic calcification aortic arch. No focal airspace disease or pulmonary edema. No pneumothorax or pleural effusion. IMPRESSION: No active cardiopulmonary disease.  Aortic atherosclerosis. Electronically Signed   By: Deatra Robinson M.D.   On: 07/25/2016 17:43    ASSESSMENT/PLAN:  UTI -  start Cipro 500 mg 1 tab by mouth twice a day 7 days and Florastor 250 mg 1 capsule by mouth twice a day 10 days  Anemia, acute blood loss -  Was recently started on Coumadin; hgb trending up Lab Results   Component Value Date   HGB 10.9 (A) 08/07/2016   Atrial Fibrillation - rate-controlled; continue Bisoprolol 10 mg 1 tab by mouth twice a day and Lipitor 20 mg 1 tab by mouth daily at bedtime       Kenard Gower, NP  [place] [phone]

## 2016-08-13 ENCOUNTER — Non-Acute Institutional Stay (SKILLED_NURSING_FACILITY): Payer: Medicare Other | Admitting: Adult Health

## 2016-08-13 ENCOUNTER — Encounter: Payer: Self-pay | Admitting: Adult Health

## 2016-08-13 DIAGNOSIS — N390 Urinary tract infection, site not specified: Secondary | ICD-10-CM | POA: Insufficient documentation

## 2016-08-13 DIAGNOSIS — M1A9XX0 Chronic gout, unspecified, without tophus (tophi): Secondary | ICD-10-CM

## 2016-08-13 DIAGNOSIS — N183 Chronic kidney disease, stage 3 unspecified: Secondary | ICD-10-CM

## 2016-08-13 DIAGNOSIS — I482 Chronic atrial fibrillation, unspecified: Secondary | ICD-10-CM

## 2016-08-13 DIAGNOSIS — Z86718 Personal history of other venous thrombosis and embolism: Secondary | ICD-10-CM | POA: Diagnosis not present

## 2016-08-13 DIAGNOSIS — Z7901 Long term (current) use of anticoagulants: Secondary | ICD-10-CM | POA: Diagnosis not present

## 2016-08-13 DIAGNOSIS — E784 Other hyperlipidemia: Secondary | ICD-10-CM

## 2016-08-13 DIAGNOSIS — S92901S Unspecified fracture of right foot, sequela: Secondary | ICD-10-CM

## 2016-08-13 DIAGNOSIS — R531 Weakness: Secondary | ICD-10-CM | POA: Diagnosis not present

## 2016-08-13 DIAGNOSIS — I10 Essential (primary) hypertension: Secondary | ICD-10-CM | POA: Diagnosis not present

## 2016-08-13 DIAGNOSIS — K5909 Other constipation: Secondary | ICD-10-CM

## 2016-08-13 DIAGNOSIS — J9611 Chronic respiratory failure with hypoxia: Secondary | ICD-10-CM | POA: Diagnosis not present

## 2016-08-13 DIAGNOSIS — S92901A Unspecified fracture of right foot, initial encounter for closed fracture: Secondary | ICD-10-CM | POA: Insufficient documentation

## 2016-08-13 DIAGNOSIS — E7849 Other hyperlipidemia: Secondary | ICD-10-CM

## 2016-08-13 DIAGNOSIS — D62 Acute posthemorrhagic anemia: Secondary | ICD-10-CM | POA: Diagnosis not present

## 2016-08-13 DIAGNOSIS — S301XXS Contusion of abdominal wall, sequela: Secondary | ICD-10-CM | POA: Diagnosis not present

## 2016-08-13 DIAGNOSIS — I5022 Chronic systolic (congestive) heart failure: Secondary | ICD-10-CM

## 2016-08-13 DIAGNOSIS — Z952 Presence of prosthetic heart valve: Secondary | ICD-10-CM | POA: Diagnosis not present

## 2016-08-13 NOTE — Progress Notes (Signed)
Patient ID: [name], female   DOB: 1941/01/29, 76 y.o.   MRN: [id number]    DATE:  08/13/2016   MRN:  [id number]  BIRTHDAY: Feb 16, 1941  Facility:  Nursing Home Location:  [facility]  Nursing Home Room Number: 1103-A  LEVEL OF CARE:  SNF (31)  Contact Information    Name Relation Home Work Mobile   Sparrow,Kay Daughter [phone]   [address]   [phone]   Jonny Ruiz Daughter [phone]         Code Status History    Date Active Date Inactive Code Status Order ID Comments User Context   07/26/2016  1:52 AM 07/28/2016  9:13 PM DNR [id number]  Alberteen Sam, MD Inpatient   06/20/2014 12:45 AM 06/23/2014  3:50 PM Full Code [id number]  Laurey Morale, MD Inpatient   11/04/2013 10:57 PM 11/11/2013  6:06 PM Full Code [id number]  Quintella Reichert, MD Inpatient   08/08/2012 11:56 PM 08/10/2012  2:52 PM Full Code [id number]  Ron Parker, MD ED   04/12/2012  2:34 PM 04/18/2012  2:35 PM Full Code [id number]  Keitha Butte, RN Inpatient   04/06/2012  4:44 PM 04/12/2012  2:34 PM Full Code [id number]  Thayer Headings, RN Inpatient    Questions for Most Recent Historical Code Status (Order [id number])    Question Answer Comment   In the event of cardiac or respiratory ARREST Do not call a "code blue"    In the event of cardiac or respiratory ARREST Do not perform Intubation, CPR, defibrillation or ACLS    In the event of cardiac or respiratory ARREST Use medication by any route, position, wound care, and other measures to relive pain and suffering. May use oxygen, suction and manual treatment of airway obstruction as needed for comfort.        Chief Complaint  Patient presents with  . Discharge Note    HISTORY OF PRESENT ILLNESS:  This is a 75 year old female who is for discharge home with home health PT, OT, nursing, CNA and social worker.  He has been admitted to [facility] on 07/28/16 from [facility] hospitalization 07/25/16 to 07/28/16 with rectus sheath hematoma. Her anticoagulation was discontinued and her aspirin was held.  She was recently started on Cipro for UTI and Coumadin. Coumadin was held yesterday due to supratherapeutic INR. INR today 3.0, therapeutic.  Patient was admitted to this facility for short-term rehabilitation after the patient's recent hospitalization.  Patient has completed SNF rehabilitation and therapy has cleared the patient for discharge.   PAST MEDICAL HISTORY:  Past Medical History:  Diagnosis Date  . A-fib [facility])    Not on coumadin due to intracebral hemorrhage  . Anemia   . Anxiety   . Aortic insufficiency    s/p tissue AVR 2013  . 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 Coronary artery disease 2013   non obstructive  . DOE (dyspnea on exertion)   . DVT (deep venous thrombosis) (HCC)   . Exogenous obesity   . Frequent falls   . Frequent UTI   . Heart murmur    prior AVR with asacending aortic root replacement August 2013  . History of CHF (congestive heart failure)   . History of gout   . History of ventricular tachycardia   . Hypertension   . Myocardial infarction 2000  . Osteopenia 09/01/2013  . Peripheral edema   . Presence of IVC filter 2000  .  Renal insufficiency   . Stroke [facility](HCC) 2000     CURRENT MEDICATIONS: Reviewed  Patient's Medications  New Prescriptions   No medications on file  Previous Medications   ACETAMINOPHEN (TYLENOL) 500 MG TABLET    Take 500 mg by mouth every 6 (six) hours as needed for moderate pain.   ALLOPURINOL (ZYLOPRIM) 100 MG TABLET    TAKE 1 TABLET (100 MG TOTAL) BY MOUTH 2 (TWO) TIMES DAILY.   AMLODIPINE (NORVASC) 10 MG TABLET    Take 1 tablet (10 mg total) by mouth daily.   ATORVASTATIN (LIPITOR) 20 MG TABLET    Take 20 mg by mouth daily.   BISOPROLOL (ZEBETA) 10 MG TABLET    Take 1 tablet (10 mg total) by mouth 2 (two) times daily.   CIPROFLOXACIN  (CIPRO) 500 MG TABLET    Take 500 mg by mouth 2 (two) times daily.   SACCHAROMYCES BOULARDII (FLORASTOR) 250 MG CAPSULE    Take 250 mg by mouth 2 (two) times daily.   SENNOSIDES-DOCUSATE SODIUM (SENOKOT-S) 8.6-50 MG TABLET    Take 1 tablet by mouth at bedtime as needed for constipation.   TRAMADOL (ULTRAM) 50 MG TABLET    Take 50 mg by mouth every 4 (four) hours as needed for moderate pain.   Modified Medications   No medications on file  Discontinued Medications   WARFARIN (COUMADIN) 4 MG TABLET    Take 4 mg by mouth daily.     Allergies  Allergen Reactions  . Adhesive [Tape] Itching, Rash and Other (See Comments)    Redness and skin breaks down  . Latex Rash  . Neomycin-Bacitracin Zn-Polymyx Rash     REVIEW OF SYSTEMS:  GENERAL: no change in appetite, no fatigue, no weight changes, no fever, chills or weakness EYES: Denies change in vision, dry eyes, eye pain, itching or discharge EARS: Denies change in hearing, ringing in ears, or earache NOSE: Denies nasal congestion or epistaxis MOUTH and THROAT: Denies oral discomfort, gingival pain or bleeding, pain from teeth or hoarseness   RESPIRATORY: no cough, SOB, DOE, wheezing, hemoptysis CARDIAC: no chest pain, edema or palpitations GI: no abdominal pain, diarrhea, constipation, heart burn, nausea or vomiting GU: Denies dysuria, frequency, hematuria, incontinence, or discharge PSYCHIATRIC: Denies feeling of depression or anxiety. No report of hallucinations, insomnia, paranoia, or agitation     PHYSICAL EXAMINATION  GENERAL APPEARANCE: Well nourished. In no acute distress. Obese SKIN:  Skin is warm and dry.  HEAD: Normal in size and contour. No evidence of trauma EYES: Lids open and close normally. No blepharitis, entropion or ectropion. PERRL. Conjunctivae are clear and sclerae are white. Lenses are without opacity EARS: Pinnae are normal. Patient hears normal voice tunes of the examiner MOUTH and THROAT: Lips are without  lesions. Oral mucosa is moist and without lesions. Tongue is normal in shape, size, and color and without lesions NECK: supple, trachea midline, no neck masses, no thyroid tenderness, no thyromegaly LYMPHATICS: no LAN in the neck, no supraclavicular LAN RESPIRATORY: breathing is even & unlabored, BS CTAB, uses O2 @ 2L/min via [place] continuously CARDIAC: RRR, no murmur,no extra heart sounds, no edema GI: abdomen soft, normal BS, no masses, no tenderness, no hepatomegaly, no splenomegaly EXTREMITIES:  Able to move 4 extremities; has RUE and RLE weakness; wears walkin boot on right PSYCHIATRIC: Alert and oriented X 3. Affect and behavior are appropriate  LABS/RADIOLOGY: Labs reviewed: Basic Metabolic Panel:  Recent Labs  62/95/2810/28/17 0504 07/27/16 0557 07/28/16 0611 07/30/16  NA  141 142 142 143  K 4.2 4.3 4.4 4.2  CL 105 108 107  --   CO2 25 25 29   --   GLUCOSE 78 81 92  --   BUN 48* 30* 27* 21  CREATININE 1.65* 1.31* 1.49* 0.9  CALCIUM 8.8* 9.0 8.8*  --    Liver Function Tests:  Recent Labs  07/08/16 1009 07/25/16 1805 07/30/16  AST 18 20 16   ALT 12 12* 8  ALKPHOS 68 75 78  BILITOT 0.4 0.5  --   PROT 7.3 7.6  --   ALBUMIN 3.8 3.5  --     Recent Labs  07/25/16 1805  LIPASE 43   CBC:  Recent Labs  07/26/16 0504 07/27/16 0557 07/28/16 0611 07/30/16 08/07/16  WBC 6.6 6.5 6.9 6.7 6.2  NEUTROABS  --  3.6 3.5 3  --   HGB 11.9* 10.6* 10.7* 10.7* 10.9*  HCT 37.2 34.2* 34.3* 34* 34*  MCV 94.9 97.4 96.1  --   --   PLT 238 248 249 255 220     Ct Abdomen Pelvis Wo Contrast  Result Date: 07/25/2016 CLINICAL DATA:  Lower abdominal pain for 5 days. EXAM: CT ABDOMEN AND PELVIS WITHOUT CONTRAST TECHNIQUE: Multidetector CT imaging of the abdomen and pelvis was performed following the standard protocol without IV contrast. COMPARISON:  CT scan of June 15, 2015. FINDINGS: Lower chest: Visualized lung bases are unremarkable. Hepatobiliary: Large solitary gallstone is again  noted without evidence of inflammation. No focal abnormality is noted in the liver on these unenhanced images. Pancreas: Normal. Spleen: Normal. Adrenals/Urinary Tract: Adrenal glands appear normal. Bilateral renal atrophy is noted. No hydronephrosis or renal obstruction is noted. No renal or ureteral calculi are noted. Stomach/Bowel: There is no evidence of bowel obstruction. Vascular/Lymphatic: Atherosclerosis of abdominal aorta is noted without aneurysm formation. Filter is noted in infrarenal IVC. No significant adenopathy is noted. Reproductive: Calcified uterine fibroid is noted. Ovaries are unremarkable. Other: There is interval development of moderate size left rectal sheath hematoma. Musculoskeletal: Severe multilevel degenerative disc disease is noted in the visualized thoracic and lumbar spine. IMPRESSION: Large solitary gallstone is again noted without inflammation. Aortic atherosclerosis. Calcified uterine fibroid. Bilateral renal atrophy with cortical scarring is noted. Interval development of moderate size left rectal sheath hematoma. Electronically Signed   By: Lupita Raider, M.D.   On: 07/25/2016 21:06   Dg Chest 2 View  Result Date: 07/25/2016 CLINICAL DATA:  Shortness of breath for 1 week EXAM: CHEST  2 VIEW COMPARISON:  Chest CT 07/25/2014 FINDINGS: Single lead left chest wall AICD is present with lead terminating in the right ventricle. A left atrial appendage clip is noted. Prosthetic aortic valve. Incompletely visualized IVC filter. Atherosclerotic calcification aortic arch. No focal airspace disease or pulmonary edema. No pneumothorax or pleural effusion. IMPRESSION: No active cardiopulmonary disease.  Aortic atherosclerosis. Electronically Signed   By: Deatra Robinson M.D.   On: 07/25/2016 17:43    ASSESSMENT/PLAN:  Generalized weakness - for home health PT, OT, CNA, nursing and social worker,  For therapeutic strengthening exercises; fall precaution  Rectus sheath hematoma -  Coumadin was held and  Restarted on 07/31/16  Anemia, acute blood loss - stable Lab Results  Component Value Date   HGB 10.9 (A) 08/07/2016   Atrial fibrillation - rate controlled; continue Bisoprolol 10 mg 1 tab by mouth twice a day and Coumadin 3 mg daily  Right foot fracture - has nondisplaced fracture of base of right fifth metatarsal; continue  cam boot and Tylenol 500 mg by mouth every 6 hours when necessary and tramadol 50 mg 1 tab by mouth every 6 hours when necessary  Chronic systolic CHF - with EF 20%;  Bisoprolol 10 mg 1 tab by mouth twice a day; since creatinine is within normal, will restart Lasix 20 mg PO Q D and check BMP in 1 week  Chronic kidney disease, stage III - GFR >60; improved Lab Results  Component Value Date   CREATININE 0.9 07/30/2016   History of DVT - S/P IVC filter; continue Coumadin 3 mg by mouth daily  Long-term use of anticoagulant - INR 3.0, therapeutic; Coumadin was held 1 day and will restart Coumadin 3 mg 1 tab by mouth daily; INR check on 08/15/16  Constipation - continue senna S1 tab by mouth daily at bedtime  Hypertension -  well-controlled; continue Norvasc 10 mg 1 tab by mouth daily and Bisoprolol 10 mg 1 tab by mouth twice a day  Hyperlipidemia  -  - continue Lipitor 20 mg 1 tab by mouth daily  UTI - continue Cipro 500 mg  by mouth twice a day to 08/15/16 and Florastor 250 mg 1 capsule by mouth twice a day till 08/20/16  Gout - continue allopurinol 100 mg 1 tab by mouth twice a day  Chronic respiratory failure - continue oxygen@2  L/min via [place] continuously  History of aVR - continue Coumadin 3 mg by mouth daily      I have filled out patient's discharge paperwork and written prescriptions.  Patient will receive home health PT, OT, SW, Nursing and CNA.  DME provided:  None  Total discharge time: Greater than 30 minutes Greater than 50% of time was spent in counseling and coordination of care with the patient.   Discharge time  involved coordination of the discharge process with social worker, nursing staff and therapy department. Medical justification for home health services verified.       Kenard Gower, NP [employer] [phone]

## 2016-08-15 ENCOUNTER — Telehealth: Payer: Self-pay | Admitting: Family

## 2016-08-15 NOTE — Telephone Encounter (Signed)
Caller name:Antonio Relationship to patient: Can be reached:[phone] Pharmacy:  Reason for call:INR results 2.3/27.9  Nursing has some questions regarding DC meds from rehab. Meds in question are lipitor(rehab placed this), lisinopril was discontinued, as well as crestor and aspirin.

## 2016-08-15 NOTE — Telephone Encounter (Signed)
Spoke with pt's daughter, [name]. She states pt was previously taking ASA 81mg  and wants to know if this ok to continue?   Pt previously on Rosuvastatin and rehab placed her on Lipitor.  Which one should pt continue?  Pt previously taking potassium once a day. Cannot remember if she has been taking this and was told by rehab to have bmet checked in 1 week from discharge which was 08/14/16. State we can give order to [facility]. Please advise?  Also,  See INR result below and advise what range we want her level to be. Currently taking Warfarin 3mg  daily.

## 2016-08-15 NOTE — Telephone Encounter (Signed)
Spoke with nurse, Malang at [phone] and advised him as follows per PCP:  Pt should continue Crestor. Stop Lipitor. Remain off Potassium for now and repeat Bmet in 1 week. (already has order from rehab)  Stay off ASA while on Coumadin. Prophylaxis DVT range should be 2-2.5. Continue 3mg  daily, recheck in 1 week. Will call Ortho on Monday to confirm how long pt should continue DVT prophylaxis.  Notified pt's daughter, [name] at [phone] and she voices understanding. Med list updated. Spoke with Tameka at [facility] [phone]  to determine length of DVT prophylaxis. They will call us back next week as Dr Victorino Dike is already gone for the day.

## 2016-08-18 MED ORDER — ASPIRIN EC 81 MG PO TBEC: 81.0000 mg | DELAYED_RELEASE_TABLET | Freq: Every day | ORAL | Status: AC

## 2016-08-18 NOTE — Telephone Encounter (Signed)
Spoke with Jill Alexanders, [place] with Dr Victorino Dike. He states pt can stop DVT prophylaxis as soon as pt is weight bearing and he states she is already at that point. If pt does not need coumadin for any other purpose she may stop Coumadin.  Please advise?

## 2016-08-18 NOTE — Telephone Encounter (Signed)
Received message from front office that pt's daughter had returned my call. I was on the line with another pt and she could not hold and requested that I return her call. Called back and reached voicemail. Left another message to return my call.

## 2016-08-18 NOTE — Telephone Encounter (Signed)
I further reviewed her chart.  She may resume aspirin 81mg  once daily. She should continue coumadin due to history of Atrial fibrillation and should follow up with coumadin clinic in 1 week.  I would like to see her back in the office for follow up please as well. OK to schedule her for next week with me if no openings this week.

## 2016-08-18 NOTE — Telephone Encounter (Signed)
Home Health is checking pt's INR currently. Who do they need to call result to in 1 week (Dr Patty Sermons)?

## 2016-08-18 NOTE — Telephone Encounter (Signed)
Notified Malang, nurse at [facility]. Left message for pt's daughter to return my call.

## 2016-08-18 NOTE — Telephone Encounter (Signed)
Notified pt's daughter and scheduled 1 week f/u with Melissa for 08/29/16 at 1:30pm.

## 2016-08-18 NOTE — Telephone Encounter (Signed)
I believe Donato Schultz is her primary cardiologist.m

## 2016-08-19 ENCOUNTER — Telehealth: Payer: Self-pay | Admitting: Family

## 2016-08-19 NOTE — Telephone Encounter (Signed)
Caller name: Shruti with [facility] Can be reached: [phone]  Reason for call: requesting VO for [facility] OT 2x week for 4 weeks. OK to leave VO on confidential VM.

## 2016-08-19 NOTE — Telephone Encounter (Signed)
Left detailed message on below voicemail and to call if any further questions. 

## 2016-08-25 ENCOUNTER — Other Ambulatory Visit: Payer: Self-pay | Admitting: Family

## 2016-08-25 ENCOUNTER — Telehealth: Payer: Self-pay | Admitting: Family

## 2016-08-25 DIAGNOSIS — E876 Hypokalemia: Secondary | ICD-10-CM

## 2016-08-25 DIAGNOSIS — I4891 Unspecified atrial fibrillation: Secondary | ICD-10-CM

## 2016-08-25 MED ORDER — WARFARIN SODIUM 3 MG PO TABS: ORAL_TABLET | ORAL | Status: DC

## 2016-08-25 NOTE — Addendum Note (Signed)
Addended by: Mervin Kung A on: 08/25/2016 07:32 PM   Modules accepted: Orders

## 2016-08-25 NOTE — Telephone Encounter (Signed)
Spoke with Misty Stanley and advised her that INR should be called to Cardiology. Contact # given again. She reported the following abnormal Bmet values and stated all other results of panel are normal. She will fax result to nurses station.  K+ 3.4  (3.5-5.1) BUN 36  (7-25) Creat  2.51  (0.51-1) Anion Gap  14 (3-11)  Please advise?

## 2016-08-25 NOTE — Telephone Encounter (Signed)
INR on Friday 08/22/16 was 3.0.

## 2016-08-25 NOTE — Telephone Encounter (Signed)
Spoke with [name], pt's granddaughter.  Advised her that pt's K+ is mildly low. Advised her to have patient focus eating some high potassium foods and she requested that I forward her the list of foods via mychart.  Also, advised her that INR was 3.0.  I told her to cut the 3mg  tab in half and take 1/2 tab two nights a week (start tonight) and 3 mg all other nights. She is staying out of town for rehab in [place] but will return on Thursday for a neurology apt.  She will come to the lab on Thursday for repeat PT/INR and bmet.  I will forward this to coumadin clinic so as an FYI since they have been following her INR.  I will also forward INR on Thursday to them.   Nicki Guadalajara- could you add her to the lab schedule at 10 AM please?

## 2016-08-25 NOTE — Telephone Encounter (Addendum)
[name]-- is INR going to be drawn in the lab with the bmet? If so, we will need to send it to [facility] I can change the order if needed. Please advise? Lab appt scheduled.

## 2016-08-25 NOTE — Telephone Encounter (Signed)
Caller name:Lisa Fields Relationship to patient:Home Health Can be reached:[facility] [phone] Pharmacy:  Reason for call:Was calling to give INR results, when transferring call, call was dropped. Did call back and left message of VM to call back

## 2016-08-26 ENCOUNTER — Telehealth: Payer: Self-pay | Admitting: *Deleted

## 2016-08-26 NOTE — Telephone Encounter (Signed)
Received Bloodwork results from [facility] Not our patient. Sent fax back to [facility] requesting to send to PCP. Faxed back to Fax #:[phone]

## 2016-08-26 NOTE — Telephone Encounter (Signed)
Yes we will draw INR and bmet. Thanks.

## 2016-08-26 NOTE — Addendum Note (Signed)
Addended by: Mervin Kung A on: 08/26/2016 02:56 PM   Modules accepted: Orders

## 2016-08-26 NOTE — Telephone Encounter (Signed)
INR order changed for [facility].

## 2016-08-28 ENCOUNTER — Other Ambulatory Visit (INDEPENDENT_AMBULATORY_CARE_PROVIDER_SITE_OTHER): Payer: Medicare Other

## 2016-08-28 ENCOUNTER — Telehealth: Payer: Self-pay | Admitting: Neurology

## 2016-08-28 ENCOUNTER — Encounter: Payer: Self-pay | Admitting: Neurology

## 2016-08-28 ENCOUNTER — Telehealth: Payer: Self-pay | Admitting: Family

## 2016-08-28 ENCOUNTER — Telehealth: Payer: Self-pay | Admitting: *Deleted

## 2016-08-28 ENCOUNTER — Ambulatory Visit (INDEPENDENT_AMBULATORY_CARE_PROVIDER_SITE_OTHER): Payer: Medicare Other | Admitting: Neurology

## 2016-08-28 ENCOUNTER — Encounter: Payer: Self-pay | Admitting: Adult Health

## 2016-08-28 ENCOUNTER — Ambulatory Visit (INDEPENDENT_AMBULATORY_CARE_PROVIDER_SITE_OTHER): Payer: Medicare Other | Admitting: Cardiovascular Disease

## 2016-08-28 VITALS — BP 129/81 | HR 60 | Ht 62.0 in | Wt 208.8 lb

## 2016-08-28 DIAGNOSIS — R413 Other amnesia: Secondary | ICD-10-CM | POA: Diagnosis not present

## 2016-08-28 DIAGNOSIS — I4891 Unspecified atrial fibrillation: Secondary | ICD-10-CM | POA: Diagnosis not present

## 2016-08-28 DIAGNOSIS — I669 Occlusion and stenosis of unspecified cerebral artery: Secondary | ICD-10-CM | POA: Diagnosis not present

## 2016-08-28 DIAGNOSIS — E876 Hypokalemia: Secondary | ICD-10-CM | POA: Diagnosis not present

## 2016-08-28 DIAGNOSIS — R404 Transient alteration of awareness: Secondary | ICD-10-CM

## 2016-08-28 DIAGNOSIS — Z5181 Encounter for therapeutic drug level monitoring: Secondary | ICD-10-CM

## 2016-08-28 DIAGNOSIS — Z952 Presence of prosthetic heart valve: Secondary | ICD-10-CM

## 2016-08-28 DIAGNOSIS — I509 Heart failure, unspecified: Secondary | ICD-10-CM

## 2016-08-28 LAB — BASIC METABOLIC PANEL
BUN: 20 mg/dL (ref 6–23)
CALCIUM: 9.6 mg/dL (ref 8.4–10.5)
CHLORIDE: 102 meq/L (ref 96–112)
CO2: 32 meq/L (ref 19–32)
CREATININE: 1.24 mg/dL — AB (ref 0.40–1.20)
GFR: 44.87 mL/min — ABNORMAL LOW (ref >60.00)
Glucose, Bld: 99 mg/dL (ref 70–99)
Potassium: 3.4 mEq/L — ABNORMAL LOW (ref 3.5–5.1)
SODIUM: 145 meq/L (ref 135–145)

## 2016-08-28 LAB — PROTIME-INR
INR: 2.2 ratio — AB (ref 0.8–1.0)
PROTHROMBIN TIME: 23.1 s — AB (ref 9.6–13.1)

## 2016-08-28 MED ORDER — WARFARIN SODIUM 3 MG PO TABS: ORAL_TABLET | ORAL | 1 refills | Status: AC

## 2016-08-28 NOTE — Addendum Note (Signed)
Addended by: Harley Alto on: 08/28/2016 11:15 AM   Modules accepted: Orders

## 2016-08-28 NOTE — Telephone Encounter (Signed)
Called and spoke to [place]. Advised we will do labs at next f/u appt per AA,MD. Also advised her to let [name] know to disregard message left on her answering machine. She verbalized understanding.

## 2016-08-28 NOTE — Telephone Encounter (Signed)
Called and spoke with [name]. She stated pt just got done with lab work at PCP office. She already had it completed. Advised I will speak with AA,MD and see how she would like to proceed and I will call back to advise. She verbalized understanding.

## 2016-08-28 NOTE — Telephone Encounter (Signed)
Release faxed to NP Sandford Craze requesting previous labs from 08/28/16.

## 2016-08-28 NOTE — Telephone Encounter (Signed)
Caller name:Barbara Relation to TG:[id number] Call back number:[phone] Pharmacy:  Reason for call: pt's daughter is needing a rx for a hospital bed for pt, pt is now living with her now and is needing another hospital bed. Please call and advise

## 2016-08-28 NOTE — Telephone Encounter (Signed)
I'll get B12 then at follow up thanks

## 2016-08-28 NOTE — Telephone Encounter (Signed)
Called and LVM for [name] asking for a return call to relay AA,MD message below. Gave [facility] phone number.

## 2016-08-28 NOTE — Telephone Encounter (Signed)
[name], she needs a B12 and methylmalonic acid with her bloodwork tomorrow please call daughter or pcp and let them knwo thanks
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
## 2016-08-28 NOTE — Patient Instructions (Signed)
Remember to drink plenty of fluid, eat healthy meals and do not skip any meals. Try to eat protein with a every meal and eat a healthy snack such as fruit or nuts in between meals. Try to keep a regular sleep-wake schedule and try to exercise daily, particularly in the form of walking, 20-30 minutes a day, if you can.   As far as diagnostic testing: EEG, consider formal neurocognitive testing  I would like to see you back in 3 months, sooner if we need to. Please call us with any interim questions, concerns, problems, updates or refill requests.   Our phone number is [phone]. We also have an after hours call service for urgent matters and there is a physician on-call for urgent questions. For any emergencies you know to call 911 or go to the nearest emergency room

## 2016-08-28 NOTE — Progress Notes (Signed)
This encounter was created in error - please disregard.

## 2016-08-29 ENCOUNTER — Ambulatory Visit: Payer: Medicare Other | Admitting: Family

## 2016-08-29 LAB — PROTIME-INR
INR: 1.1
PROTHROMBIN TIME: 11.2 s (ref 9.0–11.5)

## 2016-08-29 NOTE — Telephone Encounter (Signed)
Yes, can we call her home health agency and place an order please?

## 2016-08-29 NOTE — Telephone Encounter (Signed)
Called RN, Malang at [facility] [phone]) and left message to call back with fax # to send order to. Order placed in blue folder on CMA desk.

## 2016-08-29 NOTE — Telephone Encounter (Signed)
Melissa-- I pended a DME order. Can you complete the questions and let me know what diagnosis to use (?fracture).

## 2016-08-29 NOTE — Telephone Encounter (Signed)
Signed.

## 2016-09-01 NOTE — Telephone Encounter (Signed)
Patient's daughter [name] is calling to follow up on this situation regarding getting a hospital bed ordered for the patient. She would like a call to know that the order has been placed. Please advise.   Daughter's phone: [phone]  * looks like "[name]" is not on DPR *

## 2016-09-01 NOTE — Telephone Encounter (Signed)
Order / Rx faxed to [facility] at [phone]. Notified pt's daughter, [name].

## 2016-09-03 ENCOUNTER — Encounter: Payer: Self-pay | Admitting: Family

## 2016-09-03 ENCOUNTER — Other Ambulatory Visit: Payer: Self-pay | Admitting: Family

## 2016-09-03 DIAGNOSIS — N63 Unspecified lump in unspecified breast: Secondary | ICD-10-CM

## 2016-09-03 NOTE — Telephone Encounter (Signed)
error:[id number] ° °

## 2016-09-03 NOTE — Telephone Encounter (Signed)
Received call from [place], OT with [facility]. States rx for hospital bed was received but needs to be faxed to [phone]. Rx refaxed to new number.

## 2016-09-05 ENCOUNTER — Ambulatory Visit (INDEPENDENT_AMBULATORY_CARE_PROVIDER_SITE_OTHER): Payer: Medicare Other | Admitting: *Deleted

## 2016-09-05 DIAGNOSIS — I4891 Unspecified atrial fibrillation: Secondary | ICD-10-CM | POA: Diagnosis not present

## 2016-09-05 DIAGNOSIS — I352 Nonrheumatic aortic (valve) stenosis with insufficiency: Secondary | ICD-10-CM | POA: Diagnosis not present

## 2016-09-05 DIAGNOSIS — Z952 Presence of prosthetic heart valve: Secondary | ICD-10-CM

## 2016-09-05 DIAGNOSIS — Z5181 Encounter for therapeutic drug level monitoring: Secondary | ICD-10-CM

## 2016-09-05 LAB — POCT INR: INR: 2.8

## 2016-09-10 ENCOUNTER — Telehealth: Payer: Self-pay

## 2016-09-10 NOTE — Telephone Encounter (Signed)
Received a written order for hospital bed from [facility].  Form filled in as much as possible and forwarded to PCP for review and signature.

## 2016-09-15 ENCOUNTER — Other Ambulatory Visit: Payer: Self-pay | Admitting: Adult Health

## 2016-09-16 ENCOUNTER — Telehealth: Payer: Self-pay | Admitting: *Deleted

## 2016-09-16 MED ORDER — ALLOPURINOL 100 MG PO TABS: ORAL_TABLET | ORAL | 3 refills | Status: AC

## 2016-09-16 NOTE — Telephone Encounter (Signed)
Received fax from [facility], [place] for allopurinol 100mg . Refills sent.

## 2016-09-17 ENCOUNTER — Other Ambulatory Visit: Payer: Medicare Other

## 2016-09-18 ENCOUNTER — Telehealth: Payer: Self-pay

## 2016-09-18 ENCOUNTER — Ambulatory Visit (INDEPENDENT_AMBULATORY_CARE_PROVIDER_SITE_OTHER): Payer: Medicare Other | Admitting: *Deleted

## 2016-09-18 ENCOUNTER — Ambulatory Visit: Payer: Medicare Other | Admitting: *Deleted

## 2016-09-18 DIAGNOSIS — Z952 Presence of prosthetic heart valve: Secondary | ICD-10-CM

## 2016-09-18 DIAGNOSIS — I4891 Unspecified atrial fibrillation: Secondary | ICD-10-CM | POA: Diagnosis not present

## 2016-09-18 DIAGNOSIS — Z5181 Encounter for therapeutic drug level monitoring: Secondary | ICD-10-CM

## 2016-09-18 DIAGNOSIS — I352 Nonrheumatic aortic (valve) stenosis with insufficiency: Secondary | ICD-10-CM | POA: Diagnosis not present

## 2016-09-18 DIAGNOSIS — Z95810 Presence of automatic (implantable) cardiac defibrillator: Secondary | ICD-10-CM

## 2016-09-18 LAB — CUP PACEART INCLINIC DEVICE CHECK
Battery Voltage: 2.64 V
HIGH POWER IMPEDANCE MEASURED VALUE: 57 Ohm
HighPow Impedance: 44 Ohm
Implantable Lead Implant Date: 20020822
Implantable Lead Location: 753860
Implantable Pulse Generator Implant Date: 20091020
Lead Channel Pacing Threshold Pulse Width: 0.5 ms
Lead Channel Setting Sensing Sensitivity: 0.3 mV
MDC IDC LEAD MODEL: 6944
MDC IDC MSMT LEADCHNL RV IMPEDANCE VALUE: 824 Ohm
MDC IDC MSMT LEADCHNL RV PACING THRESHOLD AMPLITUDE: 1 V
MDC IDC MSMT LEADCHNL RV SENSING INTR AMPL: 10.317 mV
MDC IDC SESS DTM: 20171221172735
MDC IDC SET LEADCHNL RV PACING AMPLITUDE: 2.5 V
MDC IDC SET LEADCHNL RV PACING PULSEWIDTH: 0.8 ms
MDC IDC STAT BRADY RV PERCENT PACED: 28.66 %

## 2016-09-18 LAB — POCT INR: INR: 2.6

## 2016-09-18 NOTE — Telephone Encounter (Signed)
Received home health plan of care from [facility]. Form reviewed by Dr. Abner Greenspan and faxed to [phone]. Form sent for scanning.

## 2016-09-18 NOTE — Patient Instructions (Signed)
Pacemaker check in clinic. Normal device function. Thresholds, sensing, impedances consistent with previous measurements. Device programmed to maximize longevity. No mode switch or high ventricular rates noted. Device programmed at appropriate safety margins. Histogram distribution appropriate for patient activity level. Thoracic impedance trends indicate fluid accumulation since November. Device programmed to optimize intrinsic conduction. Battery voltage 2.64 (RRT 2.62) ROV with SK in 84mo. Patient education completed.

## 2016-09-19 ENCOUNTER — Ambulatory Visit
Admission: RE | Admit: 2016-09-19 | Discharge: 2016-09-19 | Disposition: A | Discharge status: Home / Self Care | Payer: Medicare Other | Source: Ambulatory Visit | Attending: Family | Admitting: Family

## 2016-09-19 DIAGNOSIS — N63 Unspecified lump in unspecified breast: Secondary | ICD-10-CM

## 2016-09-19 NOTE — Telephone Encounter (Signed)
Patient's daughter is calling regarding the hospital bed... She is very unhappy and would like a call back. Please advise

## 2016-09-19 NOTE — Telephone Encounter (Signed)
Spoke with niece and daughter, [name] re: hospital bed. States pt still hasn't received bed and [facility] is the medical supply company that bed should come from. They told her they need additional information from PCP to get bed approved through insurance. Daughter will call me back with phone # to [facility] when she returns home.

## 2016-09-19 NOTE — Telephone Encounter (Signed)
Ashlee-- Can you follow up on this while I am out of the office next week?  Received call from [name] stating [facility] phone # is [phone] and she has been talking with [facility]. Left message on phone# with our office phone and fax to have necessary paperwork / order forms faxed to [place] or to call with specific information needed from [place]. Awaiting call / fax.

## 2016-09-23 NOTE — Telephone Encounter (Addendum)
Called [place] at the number listed and spoke to [place].  Cadice stated that she received the signed written order from [facility], but along with written order, a detailed progress note discussing need for electronic bed was also needed.  Cadice said she sent over a list of things needed in the documentation in order to complete the order, but did not receive note.  Cadice says she spoke to the daughter recently and the daughter advised her that patient has an upcoming appt with a new doctor and plans to pick up the list with the wording required in the progress note later today to take to new doctor.    Called patient and left a message for call back.

## 2016-10-16 ENCOUNTER — Other Ambulatory Visit: Payer: Self-pay | Admitting: Adult Health

## 2016-11-26 ENCOUNTER — Ambulatory Visit: Payer: Medicare Other | Admitting: Neurology

## 2017-02-13 ENCOUNTER — Other Ambulatory Visit: Payer: Self-pay | Admitting: Cardiology

## 2017-02-18 ENCOUNTER — Other Ambulatory Visit: Payer: Self-pay | Admitting: Cardiology

## 2017-05-10 ENCOUNTER — Other Ambulatory Visit: Payer: Self-pay | Admitting: Internal Medicine

## 2017-11-12 ENCOUNTER — Telehealth: Payer: Self-pay | Admitting: Pulmonary Disease

## 2017-11-12 NOTE — Telephone Encounter (Signed)
Spoke to [place] with [employer] and made her aware that pt was last seen in office in 2015. Marylene Land states she will take Dr. Kendrick Fries of off pt's account. Nothing further is needed.

## 2019-02-02 ENCOUNTER — Telehealth: Payer: Self-pay | Admitting: Family

## 2019-02-02 NOTE — Telephone Encounter (Signed)
Looks like patient is past due for follow up. Please contact pt to arrange visit.

## 2019-02-02 NOTE — Telephone Encounter (Signed)
Patient report she is seeing another provider in [place]

## 2019-02-02 NOTE — Telephone Encounter (Signed)
Noted  

## 2021-05-10 ENCOUNTER — Other Ambulatory Visit: Payer: Self-pay

## 2021-05-10 ENCOUNTER — Emergency Department (HOSPITAL_BASED_OUTPATIENT_CLINIC_OR_DEPARTMENT_OTHER): Payer: Medicare Other

## 2021-05-10 ENCOUNTER — Encounter (HOSPITAL_BASED_OUTPATIENT_CLINIC_OR_DEPARTMENT_OTHER): Payer: Self-pay

## 2021-05-10 ENCOUNTER — Inpatient Hospital Stay (HOSPITAL_BASED_OUTPATIENT_CLINIC_OR_DEPARTMENT_OTHER)
Admission: EM | Admit: 2021-05-10 | Discharge: 2021-05-16 | DRG: 291 | Disposition: A | Discharge status: Home Health | Payer: Medicare Other | Attending: Internal Medicine | Admitting: Internal Medicine

## 2021-05-10 DIAGNOSIS — N183 Chronic kidney disease, stage 3 unspecified: Secondary | ICD-10-CM | POA: Diagnosis present

## 2021-05-10 DIAGNOSIS — E669 Obesity, unspecified: Secondary | ICD-10-CM | POA: Diagnosis not present

## 2021-05-10 DIAGNOSIS — Z7982 Long term (current) use of aspirin: Secondary | ICD-10-CM

## 2021-05-10 DIAGNOSIS — Z808 Family history of malignant neoplasm of other organs or systems: Secondary | ICD-10-CM

## 2021-05-10 DIAGNOSIS — E785 Hyperlipidemia, unspecified: Secondary | ICD-10-CM | POA: Diagnosis not present

## 2021-05-10 DIAGNOSIS — I509 Heart failure, unspecified: Secondary | ICD-10-CM

## 2021-05-10 DIAGNOSIS — Z5181 Encounter for therapeutic drug level monitoring: Secondary | ICD-10-CM

## 2021-05-10 DIAGNOSIS — Z7901 Long term (current) use of anticoagulants: Secondary | ICD-10-CM

## 2021-05-10 DIAGNOSIS — I252 Old myocardial infarction: Secondary | ICD-10-CM

## 2021-05-10 DIAGNOSIS — N179 Acute kidney failure, unspecified: Secondary | ICD-10-CM | POA: Diagnosis not present

## 2021-05-10 DIAGNOSIS — I272 Pulmonary hypertension, unspecified: Secondary | ICD-10-CM | POA: Diagnosis present

## 2021-05-10 DIAGNOSIS — Z953 Presence of xenogenic heart valve: Secondary | ICD-10-CM

## 2021-05-10 DIAGNOSIS — R9431 Abnormal electrocardiogram [ECG] [EKG]: Secondary | ICD-10-CM

## 2021-05-10 DIAGNOSIS — Z6836 Body mass index (BMI) 36.0-36.9, adult: Secondary | ICD-10-CM

## 2021-05-10 DIAGNOSIS — Z79899 Other long term (current) drug therapy: Secondary | ICD-10-CM

## 2021-05-10 DIAGNOSIS — R791 Abnormal coagulation profile: Secondary | ICD-10-CM | POA: Diagnosis present

## 2021-05-10 DIAGNOSIS — R7989 Other specified abnormal findings of blood chemistry: Secondary | ICD-10-CM

## 2021-05-10 DIAGNOSIS — I130 Hypertensive heart and chronic kidney disease with heart failure and stage 1 through stage 4 chronic kidney disease, or unspecified chronic kidney disease: Principal | ICD-10-CM | POA: Diagnosis present

## 2021-05-10 DIAGNOSIS — Z95810 Presence of automatic (implantable) cardiac defibrillator: Secondary | ICD-10-CM | POA: Diagnosis not present

## 2021-05-10 DIAGNOSIS — I428 Other cardiomyopathies: Secondary | ICD-10-CM | POA: Diagnosis present

## 2021-05-10 DIAGNOSIS — Z9111 Patient's noncompliance with dietary regimen: Secondary | ICD-10-CM

## 2021-05-10 DIAGNOSIS — I10 Essential (primary) hypertension: Secondary | ICD-10-CM | POA: Diagnosis present

## 2021-05-10 DIAGNOSIS — I2510 Atherosclerotic heart disease of native coronary artery without angina pectoris: Secondary | ICD-10-CM | POA: Diagnosis not present

## 2021-05-10 DIAGNOSIS — E876 Hypokalemia: Secondary | ICD-10-CM | POA: Diagnosis present

## 2021-05-10 DIAGNOSIS — R627 Adult failure to thrive: Secondary | ICD-10-CM | POA: Diagnosis present

## 2021-05-10 DIAGNOSIS — N1832 Chronic kidney disease, stage 3b: Secondary | ICD-10-CM | POA: Diagnosis present

## 2021-05-10 DIAGNOSIS — I5033 Acute on chronic diastolic (congestive) heart failure: Secondary | ICD-10-CM | POA: Diagnosis present

## 2021-05-10 DIAGNOSIS — M109 Gout, unspecified: Secondary | ICD-10-CM | POA: Diagnosis present

## 2021-05-10 DIAGNOSIS — J9621 Acute and chronic respiratory failure with hypoxia: Secondary | ICD-10-CM | POA: Diagnosis present

## 2021-05-10 DIAGNOSIS — I5043 Acute on chronic combined systolic (congestive) and diastolic (congestive) heart failure: Secondary | ICD-10-CM | POA: Diagnosis not present

## 2021-05-10 DIAGNOSIS — Z8249 Family history of ischemic heart disease and other diseases of the circulatory system: Secondary | ICD-10-CM | POA: Diagnosis not present

## 2021-05-10 DIAGNOSIS — N1330 Unspecified hydronephrosis: Secondary | ICD-10-CM | POA: Diagnosis not present

## 2021-05-10 DIAGNOSIS — I482 Chronic atrial fibrillation, unspecified: Secondary | ICD-10-CM | POA: Diagnosis present

## 2021-05-10 DIAGNOSIS — K5900 Constipation, unspecified: Secondary | ICD-10-CM | POA: Diagnosis present

## 2021-05-10 DIAGNOSIS — Z9981 Dependence on supplemental oxygen: Secondary | ICD-10-CM

## 2021-05-10 DIAGNOSIS — I444 Left anterior fascicular block: Secondary | ICD-10-CM | POA: Diagnosis present

## 2021-05-10 DIAGNOSIS — Z86718 Personal history of other venous thrombosis and embolism: Secondary | ICD-10-CM | POA: Diagnosis not present

## 2021-05-10 DIAGNOSIS — R54 Age-related physical debility: Secondary | ICD-10-CM | POA: Diagnosis present

## 2021-05-10 DIAGNOSIS — Z20822 Contact with and (suspected) exposure to covid-19: Secondary | ICD-10-CM | POA: Diagnosis not present

## 2021-05-10 DIAGNOSIS — R0602 Shortness of breath: Secondary | ICD-10-CM | POA: Diagnosis present

## 2021-05-10 DIAGNOSIS — Z8673 Personal history of transient ischemic attack (TIA), and cerebral infarction without residual deficits: Secondary | ICD-10-CM

## 2021-05-10 DIAGNOSIS — T502X5A Adverse effect of carbonic-anhydrase inhibitors, benzothiadiazides and other diuretics, initial encounter: Secondary | ICD-10-CM | POA: Diagnosis present

## 2021-05-10 LAB — I-STAT VENOUS BLOOD GAS, ED
Acid-Base Excess: 10 mmol/L — ABNORMAL HIGH (ref 0.0–2.0)
Bicarbonate: 38.1 mmol/L — ABNORMAL HIGH (ref 20.0–28.0)
Calcium, Ion: 1.17 mmol/L (ref 1.15–1.40)
HCT: 44 % (ref 36.0–46.0)
Hemoglobin: 15 g/dL (ref 12.0–15.0)
O2 Saturation: 43 %
Patient temperature: 98.3
Potassium: 3.4 mmol/L — ABNORMAL LOW (ref 3.5–5.1)
Sodium: 145 mmol/L (ref 135–145)
TCO2: 40 mmol/L — ABNORMAL HIGH (ref 22–32)
pCO2, Ven: 67 mmHg — ABNORMAL HIGH (ref 44.0–60.0)
pH, Ven: 7.361 (ref 7.250–7.430)
pO2, Ven: 26 mmHg — CL (ref 32.0–45.0)

## 2021-05-10 LAB — BRAIN NATRIURETIC PEPTIDE: B Natriuretic Peptide: 845 pg/mL — ABNORMAL HIGH (ref 0.0–100.0)

## 2021-05-10 LAB — COMPREHENSIVE METABOLIC PANEL
ALT: 20 U/L (ref 0–44)
AST: 29 U/L (ref 15–41)
Albumin: 4.2 g/dL (ref 3.5–5.0)
Alkaline Phosphatase: 58 U/L (ref 38–126)
Anion gap: 11 (ref 5–15)
BUN: 24 mg/dL — ABNORMAL HIGH (ref 8–23)
CO2: 34 mmol/L — ABNORMAL HIGH (ref 22–32)
Calcium: 9.3 mg/dL (ref 8.9–10.3)
Chloride: 99 mmol/L (ref 98–111)
Creatinine, Ser: 1.38 mg/dL — ABNORMAL HIGH (ref 0.44–1.00)
GFR, Estimated: 39 mL/min — ABNORMAL LOW (ref >60)
Glucose, Bld: 105 mg/dL — ABNORMAL HIGH (ref 70–99)
Potassium: 3.4 mmol/L — ABNORMAL LOW (ref 3.5–5.1)
Sodium: 144 mmol/L (ref 135–145)
Total Bilirubin: 0.6 mg/dL (ref 0.3–1.2)
Total Protein: 8 g/dL (ref 6.5–8.1)

## 2021-05-10 LAB — RESP PANEL BY RT-PCR (FLU A&B, COVID) ARPGX2
Influenza A by PCR: NEGATIVE
Influenza B by PCR: NEGATIVE
SARS Coronavirus 2 by RT PCR: NEGATIVE

## 2021-05-10 LAB — CBC WITH DIFFERENTIAL/PLATELET
Abs Immature Granulocytes: 0.02 10*3/uL (ref 0.00–0.07)
Basophils Absolute: 0 10*3/uL (ref 0.0–0.1)
Basophils Relative: 1 %
Eosinophils Absolute: 0.2 10*3/uL (ref 0.0–0.5)
Eosinophils Relative: 3 %
HCT: 44.4 % (ref 36.0–46.0)
Hemoglobin: 14.4 g/dL (ref 12.0–15.0)
Immature Granulocytes: 0 %
Lymphocytes Relative: 28 %
Lymphs Abs: 1.8 10*3/uL (ref 0.7–4.0)
MCH: 32.7 pg (ref 26.0–34.0)
MCHC: 32.4 g/dL (ref 30.0–36.0)
MCV: 100.7 fL — ABNORMAL HIGH (ref 80.0–100.0)
Monocytes Absolute: 0.9 10*3/uL (ref 0.1–1.0)
Monocytes Relative: 14 %
Neutro Abs: 3.6 10*3/uL (ref 1.7–7.7)
Neutrophils Relative %: 54 %
Platelets: 156 10*3/uL (ref 150–400)
RBC: 4.41 MIL/uL (ref 3.87–5.11)
RDW: 14.4 % (ref 11.5–15.5)
WBC: 6.6 10*3/uL (ref 4.0–10.5)
nRBC: 0 % (ref 0.0–0.2)

## 2021-05-10 LAB — LACTIC ACID, PLASMA: Lactic Acid, Venous: 0.8 mmol/L (ref 0.5–1.9)

## 2021-05-10 LAB — TROPONIN I (HIGH SENSITIVITY)
Troponin I (High Sensitivity): 34 ng/L — ABNORMAL HIGH (ref <18)
Troponin I (High Sensitivity): 35 ng/L — ABNORMAL HIGH (ref <18)

## 2021-05-10 LAB — PROTIME-INR
INR: 1.6 — ABNORMAL HIGH (ref 0.8–1.2)
Prothrombin Time: 19.1 seconds — ABNORMAL HIGH (ref 11.4–15.2)

## 2021-05-10 MED ORDER — WARFARIN - PHARMACIST DOSING INPATIENT
Freq: Every day | Status: DC
  Filled 2021-05-10: qty 1

## 2021-05-10 MED ORDER — WARFARIN SODIUM 5 MG PO TABS
5.0000 mg | ORAL_TABLET | Freq: Once | ORAL | Status: AC
  Administered 2021-05-11: 5 mg via ORAL
  Filled 2021-05-10: qty 1

## 2021-05-10 MED ORDER — ENOXAPARIN SODIUM 60 MG/0.6ML IJ SOSY
60.0000 mg | PREFILLED_SYRINGE | Freq: Once | INTRAMUSCULAR | Status: AC
  Administered 2021-05-11: 60 mg via SUBCUTANEOUS
  Filled 2021-05-10: qty 0.6

## 2021-05-10 MED ORDER — NITROGLYCERIN 2 % TD OINT
1.0000 [in_us] | TOPICAL_OINTMENT | Freq: Four times a day (QID) | TRANSDERMAL | Status: DC
  Administered 2021-05-10 – 2021-05-11 (×2): 1 [in_us] via TOPICAL
  Filled 2021-05-10: qty 1
  Filled 2021-05-10: qty 30

## 2021-05-10 MED ORDER — FUROSEMIDE 10 MG/ML IJ SOLN
40.0000 mg | Freq: Once | INTRAMUSCULAR | Status: AC
  Administered 2021-05-10: 40 mg via INTRAVENOUS
  Filled 2021-05-10: qty 4

## 2021-05-10 NOTE — ED Provider Notes (Signed)
[facility] EMERGENCY DEPARTMENT Provider Note   CSN: [id number] Arrival date & time: 05/10/21  1851     History Chief Complaint  Patient presents with  . Shortness of Breath    [name] is a 80 y.o. female.  HPI PMH of hypertension, hyperlipidemia, CHF with EF of 60%, atrial fibrillation, nonischemic cardiomyopathy s/p ICD placement, stroke, AVR anticoagulated.  Patient reports she has been more short of breath for the past several days.  This has been somewhat incremental over weeks but the past 2 days got much worse.  Patient reports that she has too much shortness of breath to lie flat.  Minor activity is making her short of breath.  Patient's daughter noted her to look like she was having difficulty breathing today.  Patient reports has had some degree of a pressure-like chest pain that is come and gone.  Not there presently.  Patient was using nighttime oxygen but for the past day has been using some daytime oxygen as well.  She had chronically been on 2 L oxygen about a year ago but then had seen some improvement it was only using nighttime and as needed.  The past day she has needed daytime oxygen as well.  He denies any fevers or body aches.  Patient always has some swelling in her legs.  She does not note that it significantly worse than normal for her. patient has had COVID-vaccine and booster    Past Medical History:  Diagnosis Date  . A-fib [facility])    Not on coumadin due to intracebral hemorrhage  . Anemia   . Anxiety   . Aortic insufficiency    s/p tissue AVR 2013  . 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 Coronary artery disease 2013   non obstructive  . DOE (dyspnea on exertion)   . DVT (deep venous thrombosis) (HCC)   . Exogenous obesity   . Frequent falls   . Frequent UTI   . Heart murmur    prior AVR with asacending aortic root replacement August 2013  . History of CHF (congestive heart failure)   .  History of gout   . History of ventricular tachycardia   . Hypertension   . Myocardial infarction (HCC) 2000  . Osteopenia 09/01/2013  . Peripheral edema   . Presence of IVC filter 2000  . Renal insufficiency   . Stroke [facility]) 2000    Patient Active Problem List   Diagnosis Date Noted  . Foot fracture, right 08/13/2016  . History of DVT (deep vein thrombosis) 08/13/2016  . UTI (urinary tract infection) 08/13/2016  . Supratherapeutic INR 07/26/2016  . Rectus sheath hematoma, initial encounter 07/26/2016  . Acute blood loss anemia 07/26/2016  . Acute kidney injury (HCC) 07/25/2016  . Preventative health care 09/11/2015  . Gait instability 05/01/2015  . Dyspnea 07/03/2014  . Chronic hypoxemic respiratory failure (HCC) 07/03/2014  . NICM- EF 20% 2013- 45-50% Feb 2015 06/21/2014  . LBBB (left bundle branch block) 06/21/2014  . Chronic anticoagulation- Coumadin 06/21/2014  . Bronchitis with bronchospasm 06/21/2014  . Low back pain 06/13/2014  . S/P implantation of automatic cardioverter/defibrillator (AICD) 11/21/2013  . CKD (chronic kidney disease), stage III (HCC) 11/04/2013  . Chronic atrial fibrillation (HCC) 11/04/2013  . Gout 09/19/2013  . Osteopenia 09/01/2013  . Chronic constipation 07/11/2013  . Generalized weakness 08/08/2012  . Annual physical exam 07/08/2012  . H/O tissue AVR and AO root replacement 2013 04/27/2012  . 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 Anemia 02/17/2012  . Chronic systolic congestive heart failure, NYHA class 3 (HCC) 02/14/2012  . Cardiac arrest (HCC) 11/04/2011  . Implantable cardiac defibrillator single-chamber-Medtronic 11/04/2011  . Alopecia 11/04/2011  . Aortic insufficiency and aortic stenosis 01/01/2011  . COMBINED HEART FAILURE, ACUTE ON CHRONIC 11/25/2010  . Back pain- s/p recent steroid dose pack 12/19/2009  . Memory loss 09/05/2009  . VENOUS INSUFFICIENCY 01/19/2009  .  HYPERLIPIDEMIA 01/02/2009  . Essential hypertension 01/02/2009    Past Surgical History:  Procedure Laterality Date  . AORTIC VALVE REPLACEMENT  04/12/2012   Procedure: AORTIC VALVE REPLACEMENT (AVR);  Surgeon: Delight Ovens, MD;  Location: [facility] OR;  Service: Open Heart Surgery;  Laterality: N/A;  with nitric oxide  . CARDIAC CATHETERIZATION  7/13   non obstructive CAD  . CARDIAC DEFIBRILLATOR PLACEMENT  2013   MDT ICD  . CATARACT EXTRACTION    . clot removal  5.2013   blood clot removed from the back of the left leg  . EMBOLECTOMY  02/15/2012   Procedure: EMBOLECTOMY;  Surgeon: [name], MD;  Location: [facility] OR;  Service: Vascular;  Laterality: Left;  left popliteal embolectomy with drain placement  . EYE SURGERY  2012   removal of cataracts bilaterally  . LEFT HEART CATHETERIZATION WITH CORONARY ANGIOGRAM N/A 04/08/2012   Procedure: LEFT HEART CATHETERIZATION WITH CORONARY ANGIOGRAM;  Surgeon: Tonny Bollman, MD;  Location: [facility] CATH LAB;  Service: Cardiovascular;  Laterality: N/A;  . STOMACH SURGERY     lap band placed and later removed  . THORACIC AORTIC ANEURYSM REPAIR  04/12/2012   Procedure: THORACIC ASCENDING ANEURYSM REPAIR (AAA);  Surgeon: Delight Ovens, MD;  Location: [facility] OR;  Service: Open Heart Surgery;  Laterality: N/A;  . TONSILLECTOMY       OB History   No obstetric history on file.     Family History  Problem Relation Age of Onset  . Hypertension Father   . Cancer Father   . Coronary artery disease Father 6  . Cancer Son        pancreas, spine  . Dementia Neg Hx     Social History   Tobacco Use  . Smoking status: Never  . Smokeless tobacco: Never  Substance Use Topics  . Alcohol use: No  . Drug use: No    Home Medications Prior to Admission medications   Medication Sig Start Date End Date Taking? Authorizing Provider  acetaminophen (TYLENOL) 500 MG tablet Take 500 mg by mouth every 6 (six) hours as needed for moderate pain.     [provider]  allopurinol (ZYLOPRIM) 100 MG tablet TAKE 1 TABLET (100 MG TOTAL) BY MOUTH 2 (TWO) TIMES DAILY. 09/16/16   Sandford Craze, NP  amLODipine (NORVASC) 10 MG tablet Take 1 tablet (10 mg total) by mouth daily. 07/28/16   Rhetta Mura, MD  aspirin EC 81 MG tablet Take 1 tablet (81 mg total) by mouth daily. 08/18/16   Sandford Craze, NP  atorvastatin (LIPITOR) 20 MG tablet Take 20 mg by mouth daily.    [provider]  bisoprolol (ZEBETA) 10 MG tablet Take 1 tablet (10 mg total) by mouth 2 (two) times daily. 04/16/16   Duke Salvia, MD  furosemide (LASIX) 40 MG tablet Take 40 mg by mouth daily.    [provider]  sennosides-docusate sodium (SENOKOT-S) 8.6-50 MG tablet Take 1 tablet by mouth at bedtime as needed for constipation.  [provider]  traMADol (ULTRAM) 50 MG tablet Take 50 mg by mouth every 4 (four) hours as needed for moderate pain.     [provider]  warfarin (COUMADIN) 3 MG tablet Take 3mg  daily except 1.5mg  on Tuesdays or as directed by Coumadin Clinic 08/28/16   Jake Bathe, MD    Allergies    Adhesive [tape], Latex, and Neomycin-bacitracin zn-polymyx  Review of Systems   Review of Systems 10 systems reviewed and negative except as per HPI Physical Exam Updated Vital Signs BP (!) 172/116   Pulse (!) 38   Temp 98.3 F (36.8 C) (Oral)   Resp (!) 23   Ht 5\' 2"  (1.575 m)   Wt 83.9 kg   SpO2 100%   BMI 33.84 kg/m   Physical Exam Constitutional:      Comments: Patient is alert.  Mild to moderate increased work of breathing at rest.  HENT:     Mouth/Throat:     Pharynx: Oropharynx is clear.  Eyes:     Extraocular Movements: Extraocular movements intact.  Cardiovascular:     Comments: Heart irregular.  Distant heart sounds.  No gross rub murmur gallop.  Lungs are grossly clear with soft breath sounds at the bases Abdominal:     General: There is no distension.     Palpations: Abdomen  is soft.     Tenderness: There is no abdominal tenderness. There is no guarding.  Musculoskeletal:     Comments: About 2+ pitting edema bilateral lower extremities.  Feet are in good condition.  No active wounds.  Skin:    General: Skin is warm and dry.  Neurological:     General: No focal deficit present.     Mental Status: She is oriented to person, place, and time.     Coordination: Coordination normal.    ED Results / Procedures / Treatments   Labs (all labs ordered are listed, but only abnormal results are displayed) Labs Reviewed  COMPREHENSIVE METABOLIC PANEL - Abnormal; Notable for the following components:      Result Value   Potassium 3.4 (*)    CO2 34 (*)    Glucose, Bld 105 (*)    BUN 24 (*)    Creatinine, Ser 1.38 (*)    GFR, Estimated 39 (*)    All other components within normal limits  BRAIN NATRIURETIC PEPTIDE - Abnormal; Notable for the following components:   B Natriuretic Peptide 845.0 (*)    All other components within normal limits  CBC WITH DIFFERENTIAL/PLATELET - Abnormal; Notable for the following components:   MCV 100.7 (*)    All other components within normal limits  PROTIME-INR - Abnormal; Notable for the following components:   Prothrombin Time 19.1 (*)    INR 1.6 (*)    All other components within normal limits  I-STAT VENOUS BLOOD GAS, ED - Abnormal; Notable for the following components:   pCO2, Ven 67.0 (*)    pO2, Ven 26.0 (*)    Bicarbonate 38.1 (*)    TCO2 40 (*)    Acid-Base Excess 10.0 (*)    Potassium 3.4 (*)    All other components within normal limits  TROPONIN I (HIGH SENSITIVITY) - Abnormal; Notable for the following components:   Troponin I (High Sensitivity) 34 (*)    All other components within normal limits  RESP PANEL BY RT-PCR (FLU A&B, COVID) [id number]  LACTIC ACID, PLASMA  LACTIC ACID, PLASMA  URINALYSIS, ROUTINE W REFLEX MICROSCOPIC  BLOOD GAS, VENOUS  TROPONIN I (HIGH SENSITIVITY)    EKG EKG  Interpretation  Date/Time:  Friday May 10 2021 19:31:53 EDT Ventricular Rate:  67 PR Interval:    QRS Duration: 133 QT Interval:  483 QTC Calculation: 510 R Axis:   -87 Text Interpretation: Atrial fibrillation Ventricular bigeminy Nonspecific IVCD with LAD no sig change from previous except PVCs and wandering baseline artifact. Confirmed by Arby Barrette [phone]) on 05/10/2021 10:27:57 PM  Radiology DG Chest Port 1 View  Result Date: 05/10/2021 CLINICAL DATA:  Shortness of breath for 1 week, worse today. EXAM: PORTABLE CHEST 1 VIEW COMPARISON:  Two-view chest x-ray 07/25/2016 FINDINGS: Heart is enlarged. Atherosclerotic changes are noted at the aortic arch. Patient is status post aortic valve replacement and atrial clipping. AICD is in place. Lung volumes are low. Moderate pulmonary vascular congestion is present. No focal airspace disease is evident. IMPRESSION: Cardiomegaly and moderate pulmonary vascular congestion without frank edema. Electronically Signed   By: Marin Roberts M.D.   On: 05/10/2021 20:11    Procedures Procedures  CRITICAL CARE Performed by: Arby Barrette   Total critical care time: 30 minutes  Critical care time was exclusive of separately billable procedures and treating other patients.  Critical care was necessary to treat or prevent imminent or life-threatening deterioration.  Critical care was time spent personally by me on the following activities: development of treatment plan with patient and/or surrogate as well as nursing, discussions with consultants, evaluation of patient's response to treatment, examination of patient, obtaining history from patient or surrogate, ordering and performing treatments and interventions, ordering and review of laboratory studies, ordering and review of radiographic studies, pulse oximetry and re-evaluation of patient's condition.  Medications Ordered in ED Medications  furosemide (LASIX) injection 40 mg (has no  administration in time range)  nitroGLYCERIN (NITROGLYN) 2 % ointment 1 inch (has no administration in time range)    ED Course  I have reviewed the triage vital signs and the nursing notes.  Pertinent labs & imaging results that were available during my care of the patient were reviewed by me and considered in my medical decision making (see chart for details).    MDM Rules/Calculators/A&P                           Patient presents with increasing shortness of breath and weight gain with lower extremity swelling.  Patient has had some variable chest pressure over the past couple of days.  EKG does not show acute ischemic pattern.  No significant change from previous except PVCs.  Patient does not show signs of respiratory fatigue.  She is on 2 L nasal cannula oxygen.  We will start Lasix and nitroglycerin topical ointment.  Will need to confirm blood pressures.  Patient's daughter does not feel that the elevated blood pressures are accurate due to the patient clenching up every time the cuff goes off due to discomfort.  If consistently elevated, will initiate IV BP control as well.  Patient reports she is due for her evening warfarin, INR is subtherapeutic, will administer bridging Lovenox and evening dose of warfarin. Final Clinical Impression(s) / ED Diagnoses Final diagnoses:  Acute on chronic congestive heart failure, unspecified heart failure type [facility])    Rx / DC Orders ED Discharge Orders     None        Arby Barrette, MD 05/18/21 [phone]

## 2021-05-10 NOTE — ED Notes (Signed)
ED Provider at bedside. 

## 2021-05-10 NOTE — ED Notes (Signed)
Unable to take manual Bp.

## 2021-05-10 NOTE — ED Notes (Signed)
RT at bedside.

## 2021-05-10 NOTE — ED Notes (Signed)
RT assessed patient in waiting room due to no rooms available at the moment. SAT 96%, HR 62. Appeared to be mildly SOB, and stated she had been SOB for a while. Patient to been seen shortly in triage

## 2021-05-10 NOTE — ED Triage Notes (Signed)
Pt and daughter report pt c/o SOB x 1 week-worse x today-pt wears O2 at night- labored breathing noted/difficulty completing sentences-to triage in w/c

## 2021-05-11 ENCOUNTER — Inpatient Hospital Stay (HOSPITAL_COMMUNITY): Payer: Medicare Other

## 2021-05-11 ENCOUNTER — Encounter (HOSPITAL_COMMUNITY): Payer: Self-pay | Admitting: Internal Medicine

## 2021-05-11 DIAGNOSIS — I428 Other cardiomyopathies: Secondary | ICD-10-CM | POA: Diagnosis not present

## 2021-05-11 DIAGNOSIS — M109 Gout, unspecified: Secondary | ICD-10-CM | POA: Diagnosis not present

## 2021-05-11 DIAGNOSIS — N1330 Unspecified hydronephrosis: Secondary | ICD-10-CM | POA: Diagnosis not present

## 2021-05-11 DIAGNOSIS — N1832 Chronic kidney disease, stage 3b: Secondary | ICD-10-CM | POA: Diagnosis not present

## 2021-05-11 DIAGNOSIS — E785 Hyperlipidemia, unspecified: Secondary | ICD-10-CM | POA: Diagnosis not present

## 2021-05-11 DIAGNOSIS — I10 Essential (primary) hypertension: Secondary | ICD-10-CM

## 2021-05-11 DIAGNOSIS — I482 Chronic atrial fibrillation, unspecified: Secondary | ICD-10-CM | POA: Diagnosis not present

## 2021-05-11 DIAGNOSIS — Z6836 Body mass index (BMI) 36.0-36.9, adult: Secondary | ICD-10-CM | POA: Diagnosis not present

## 2021-05-11 DIAGNOSIS — N179 Acute kidney failure, unspecified: Secondary | ICD-10-CM | POA: Diagnosis not present

## 2021-05-11 DIAGNOSIS — I272 Pulmonary hypertension, unspecified: Secondary | ICD-10-CM | POA: Diagnosis not present

## 2021-05-11 DIAGNOSIS — Z95810 Presence of automatic (implantable) cardiac defibrillator: Secondary | ICD-10-CM | POA: Diagnosis not present

## 2021-05-11 DIAGNOSIS — I5033 Acute on chronic diastolic (congestive) heart failure: Secondary | ICD-10-CM

## 2021-05-11 DIAGNOSIS — J9621 Acute and chronic respiratory failure with hypoxia: Secondary | ICD-10-CM | POA: Diagnosis not present

## 2021-05-11 DIAGNOSIS — Z7901 Long term (current) use of anticoagulants: Secondary | ICD-10-CM | POA: Diagnosis not present

## 2021-05-11 DIAGNOSIS — Z79899 Other long term (current) drug therapy: Secondary | ICD-10-CM | POA: Diagnosis not present

## 2021-05-11 DIAGNOSIS — I130 Hypertensive heart and chronic kidney disease with heart failure and stage 1 through stage 4 chronic kidney disease, or unspecified chronic kidney disease: Secondary | ICD-10-CM | POA: Diagnosis not present

## 2021-05-11 DIAGNOSIS — Z7982 Long term (current) use of aspirin: Secondary | ICD-10-CM | POA: Diagnosis not present

## 2021-05-11 DIAGNOSIS — E669 Obesity, unspecified: Secondary | ICD-10-CM | POA: Diagnosis not present

## 2021-05-11 DIAGNOSIS — R9431 Abnormal electrocardiogram [ECG] [EKG]: Secondary | ICD-10-CM

## 2021-05-11 DIAGNOSIS — I2510 Atherosclerotic heart disease of native coronary artery without angina pectoris: Secondary | ICD-10-CM | POA: Diagnosis not present

## 2021-05-11 DIAGNOSIS — Z953 Presence of xenogenic heart valve: Secondary | ICD-10-CM | POA: Diagnosis not present

## 2021-05-11 DIAGNOSIS — I252 Old myocardial infarction: Secondary | ICD-10-CM | POA: Diagnosis not present

## 2021-05-11 DIAGNOSIS — I5043 Acute on chronic combined systolic (congestive) and diastolic (congestive) heart failure: Secondary | ICD-10-CM | POA: Diagnosis not present

## 2021-05-11 DIAGNOSIS — Z8249 Family history of ischemic heart disease and other diseases of the circulatory system: Secondary | ICD-10-CM | POA: Diagnosis not present

## 2021-05-11 DIAGNOSIS — Z5181 Encounter for therapeutic drug level monitoring: Secondary | ICD-10-CM

## 2021-05-11 DIAGNOSIS — Z20822 Contact with and (suspected) exposure to covid-19: Secondary | ICD-10-CM | POA: Diagnosis not present

## 2021-05-11 DIAGNOSIS — Z808 Family history of malignant neoplasm of other organs or systems: Secondary | ICD-10-CM | POA: Diagnosis not present

## 2021-05-11 DIAGNOSIS — Z86718 Personal history of other venous thrombosis and embolism: Secondary | ICD-10-CM | POA: Diagnosis not present

## 2021-05-11 DIAGNOSIS — R0602 Shortness of breath: Secondary | ICD-10-CM | POA: Diagnosis present

## 2021-05-11 LAB — BASIC METABOLIC PANEL
Anion gap: 9 (ref 5–15)
BUN: 22 mg/dL (ref 8–23)
CO2: 31 mmol/L (ref 22–32)
Calcium: 8.9 mg/dL (ref 8.9–10.3)
Chloride: 101 mmol/L (ref 98–111)
Creatinine, Ser: 1.32 mg/dL — ABNORMAL HIGH (ref 0.44–1.00)
GFR, Estimated: 41 mL/min — ABNORMAL LOW (ref >60)
Glucose, Bld: 97 mg/dL (ref 70–99)
Potassium: 3.4 mmol/L — ABNORMAL LOW (ref 3.5–5.1)
Sodium: 141 mmol/L (ref 135–145)

## 2021-05-11 LAB — CBC
HCT: 39.6 % (ref 36.0–46.0)
Hemoglobin: 12.6 g/dL (ref 12.0–15.0)
MCH: 32.5 pg (ref 26.0–34.0)
MCHC: 31.8 g/dL (ref 30.0–36.0)
MCV: 102.1 fL — ABNORMAL HIGH (ref 80.0–100.0)
Platelets: 147 10*3/uL — ABNORMAL LOW (ref 150–400)
RBC: 3.88 MIL/uL (ref 3.87–5.11)
RDW: 14.3 % (ref 11.5–15.5)
WBC: 8.1 10*3/uL (ref 4.0–10.5)
nRBC: 0 % (ref 0.0–0.2)

## 2021-05-11 LAB — ECHOCARDIOGRAM COMPLETE
AR max vel: 1.46 cm2
AV Area VTI: 1.53 cm2
AV Area mean vel: 1.4 cm2
AV Mean grad: 8.3 mmHg
AV Peak grad: 14.8 mmHg
Ao pk vel: 1.93 m/s
Height: 60 in
S' Lateral: 3.8 cm
Weight: 2955.93 oz

## 2021-05-11 LAB — TROPONIN I (HIGH SENSITIVITY)
Troponin I (High Sensitivity): 33 ng/L — ABNORMAL HIGH (ref <18)
Troponin I (High Sensitivity): 35 ng/L — ABNORMAL HIGH (ref <18)

## 2021-05-11 LAB — PROTIME-INR
INR: 1.6 — ABNORMAL HIGH (ref 0.8–1.2)
Prothrombin Time: 19.2 seconds — ABNORMAL HIGH (ref 11.4–15.2)

## 2021-05-11 MED ORDER — SACUBITRIL-VALSARTAN 24-26 MG PO TABS
1.0000 | ORAL_TABLET | Freq: Two times a day (BID) | ORAL | Status: DC
  Administered 2021-05-11 – 2021-05-14 (×7): 1 via ORAL
  Filled 2021-05-11 (×7): qty 1

## 2021-05-11 MED ORDER — METOPROLOL SUCCINATE ER 100 MG PO TB24
100.0000 mg | ORAL_TABLET | Freq: Every day | ORAL | Status: DC
  Administered 2021-05-13 – 2021-05-14 (×2): 100 mg via ORAL
  Filled 2021-05-11 (×4): qty 1

## 2021-05-11 MED ORDER — ENOXAPARIN SODIUM 80 MG/0.8ML IJ SOSY
80.0000 mg | PREFILLED_SYRINGE | Freq: Two times a day (BID) | INTRAMUSCULAR | Status: DC
  Administered 2021-05-12: 80 mg via SUBCUTANEOUS
  Filled 2021-05-11: qty 0.8

## 2021-05-11 MED ORDER — ACETAMINOPHEN 325 MG PO TABS: 650.0000 mg | ORAL_TABLET | Freq: Four times a day (QID) | ORAL | Status: DC | PRN

## 2021-05-11 MED ORDER — AMLODIPINE BESYLATE 10 MG PO TABS
10.0000 mg | ORAL_TABLET | Freq: Every day | ORAL | Status: DC
  Administered 2021-05-11 – 2021-05-14 (×5): 10 mg via ORAL
  Filled 2021-05-11 (×5): qty 1

## 2021-05-11 MED ORDER — FUROSEMIDE 10 MG/ML IJ SOLN
60.0000 mg | Freq: Two times a day (BID) | INTRAMUSCULAR | Status: DC
  Administered 2021-05-11: 60 mg via INTRAVENOUS
  Filled 2021-05-11: qty 6

## 2021-05-11 MED ORDER — WARFARIN - PHARMACIST DOSING INPATIENT: Freq: Every day | Status: DC

## 2021-05-11 MED ORDER — BISOPROLOL FUMARATE 5 MG PO TABS: 10.0000 mg | ORAL_TABLET | Freq: Two times a day (BID) | ORAL | Status: DC

## 2021-05-11 MED ORDER — SODIUM CHLORIDE 0.9 % IV SOLN: 250.0000 mL | INTRAVENOUS | Status: DC | PRN

## 2021-05-11 MED ORDER — HAIR/SKIN/NAILS PO TABS: ORAL_TABLET | Freq: Every day | ORAL | Status: DC

## 2021-05-11 MED ORDER — METOPROLOL SUCCINATE ER 100 MG PO TB24
100.0000 mg | ORAL_TABLET | Freq: Every day | ORAL | Status: DC
  Administered 2021-05-11 (×2): 100 mg via ORAL
  Filled 2021-05-11 (×2): qty 1

## 2021-05-11 MED ORDER — CALCIUM CARBONATE 1250 (500 CA) MG PO TABS
600.0000 mg | ORAL_TABLET | Freq: Every day | ORAL | Status: DC
  Administered 2021-05-11 – 2021-05-15 (×5): 625 mg via ORAL
  Filled 2021-05-11 (×6): qty 1

## 2021-05-11 MED ORDER — ASPIRIN EC 81 MG PO TBEC
81.0000 mg | DELAYED_RELEASE_TABLET | Freq: Every day | ORAL | Status: DC
  Administered 2021-05-11 – 2021-05-16 (×6): 81 mg via ORAL
  Filled 2021-05-11 (×6): qty 1

## 2021-05-11 MED ORDER — WARFARIN SODIUM 3 MG PO TABS
3.0000 mg | ORAL_TABLET | Freq: Every day | ORAL | Status: DC
Start: 2021-05-11 — End: 2021-05-11

## 2021-05-11 MED ORDER — ACETAMINOPHEN 650 MG RE SUPP: 650.0000 mg | Freq: Four times a day (QID) | RECTAL | Status: DC | PRN

## 2021-05-11 MED ORDER — SENNOSIDES-DOCUSATE SODIUM 8.6-50 MG PO TABS: 1.0000 | ORAL_TABLET | Freq: Every evening | ORAL | Status: DC | PRN

## 2021-05-11 MED ORDER — ROSUVASTATIN CALCIUM 20 MG PO TABS
20.0000 mg | ORAL_TABLET | Freq: Every day | ORAL | Status: DC
  Administered 2021-05-11 – 2021-05-13 (×3): 20 mg via ORAL
  Filled 2021-05-11 (×3): qty 1

## 2021-05-11 MED ORDER — ATORVASTATIN CALCIUM 10 MG PO TABS
20.0000 mg | ORAL_TABLET | Freq: Every day | ORAL | Status: DC
  Administered 2021-05-11: 20 mg via ORAL
  Filled 2021-05-11: qty 2

## 2021-05-11 MED ORDER — ALLOPURINOL 100 MG PO TABS
50.0000 mg | ORAL_TABLET | Freq: Every day | ORAL | Status: DC
  Administered 2021-05-11 – 2021-05-16 (×6): 50 mg via ORAL
  Filled 2021-05-11 (×6): qty 1

## 2021-05-11 MED ORDER — VITAMIN D3 25 MCG (1000 UNIT) PO TABS
ORAL_TABLET | Freq: Every day | ORAL | Status: DC
  Administered 2021-05-12 – 2021-05-15 (×4): 1000 [IU] via ORAL
  Filled 2021-05-11: qty 2
  Filled 2021-05-11 (×2): qty 1
  Filled 2021-05-11: qty 2
  Filled 2021-05-11: qty 1
  Filled 2021-05-11: qty 2
  Filled 2021-05-11: qty 1
  Filled 2021-05-11 (×3): qty 2

## 2021-05-11 MED ORDER — WARFARIN SODIUM 3 MG PO TABS
3.0000 mg | ORAL_TABLET | Freq: Once | ORAL | Status: AC
  Administered 2021-05-11: 3 mg via ORAL
  Filled 2021-05-11: qty 1

## 2021-05-11 MED ORDER — ENOXAPARIN SODIUM 80 MG/0.8ML IJ SOSY
80.0000 mg | PREFILLED_SYRINGE | INTRAMUSCULAR | Status: DC
  Administered 2021-05-11: 80 mg via SUBCUTANEOUS
  Filled 2021-05-11: qty 0.8

## 2021-05-11 MED ORDER — FUROSEMIDE 10 MG/ML IJ SOLN
60.0000 mg | Freq: Three times a day (TID) | INTRAMUSCULAR | Status: DC
  Administered 2021-05-11 – 2021-05-13 (×6): 60 mg via INTRAVENOUS
  Filled 2021-05-11 (×6): qty 6

## 2021-05-11 MED ORDER — SODIUM CHLORIDE 0.9% FLUSH: 3.0000 mL | INTRAVENOUS | Status: DC | PRN

## 2021-05-11 MED ORDER — SODIUM CHLORIDE 0.9% FLUSH
3.0000 mL | Freq: Two times a day (BID) | INTRAVENOUS | Status: DC
  Administered 2021-05-11 – 2021-05-16 (×11): 3 mL via INTRAVENOUS

## 2021-05-11 NOTE — Plan of Care (Signed)

## 2021-05-11 NOTE — Progress Notes (Signed)
ANTICOAGULATION CONSULT NOTE - Initial Consult  Pharmacy Consult for Coumadin Indication: atrial fibrillation  Allergies  Allergen Reactions   Adhesive [Tape] Itching, Rash and Other (See Comments)    Redness and skin breaks down   Latex Rash   Neomycin-Bacitracin Zn-Polymyx Rash    Patient Measurements: Height: 5' (152.4 cm) Weight: 83.8 kg (184 lb 11.9 oz) IBW/kg (Calculated) : 45.5   Vital Signs: Temp: 97.8 F (36.6 C) (08/13 0356) Temp Source: Oral (08/13 0356) BP: 161/87 (08/13 0356) Pulse Rate: 84 (08/13 0356)  Labs: Recent Labs    05/10/21 1949 05/10/21 2023 05/10/21 2230 05/11/21 0313  HGB 14.4 15.0  --  12.6  HCT 44.4 44.0  --  39.6  PLT 156  --   --  147*  LABPROT 19.1*  --   --  19.2*  INR 1.6*  --   --  1.6*  CREATININE 1.38*  --   --  1.32*  TROPONINIHS 34*  --  35* 33*     Estimated Creatinine Clearance: 33.2 mL/min (A) (by C-G formula based on SCr of 1.32 mg/dL (H)).   Medical History: Past Medical History:  Diagnosis Date   A-fib [facility])    Not on coumadin due to intracebral hemorrhage   Anemia    Anxiety    Aortic insufficiency    s/p tissue AVR 2013   Automatic implantable cardiac defibrillator single-chamber-Medtronic 11/04/2011   Coronary artery disease 2013   non obstructive   DOE (dyspnea on exertion)    DVT (deep venous thrombosis) (HCC)    Exogenous obesity    Frequent falls    Frequent UTI    Heart murmur    prior AVR with asacending aortic root replacement August 2013   History of CHF (congestive heart failure)    History of gout    History of ventricular tachycardia    Hypertension    Myocardial infarction (HCC) 2000   Osteopenia 09/01/2013   Peripheral edema    Presence of IVC filter 2000   Renal insufficiency    Stroke (HCC) 2000    Medications:  No current facility-administered medications on file prior to encounter.   Current Outpatient Medications on File Prior to Encounter  Medication Sig Dispense Refill    acetaminophen (TYLENOL) 500 MG tablet Take 500 mg by mouth every 6 (six) hours as needed for moderate pain.     allopurinol (ZYLOPRIM) 100 MG tablet TAKE 1 TABLET (100 MG TOTAL) BY MOUTH 2 (TWO) TIMES DAILY. 60 tablet 3   amLODipine (NORVASC) 10 MG tablet Take 1 tablet (10 mg total) by mouth daily. 90 tablet 3   aspirin EC 81 MG tablet Take 1 tablet (81 mg total) by mouth daily.     atorvastatin (LIPITOR) 20 MG tablet Take 20 mg by mouth daily.     bisoprolol (ZEBETA) 10 MG tablet Take 1 tablet (10 mg total) by mouth 2 (two) times daily. 180 tablet 3   furosemide (LASIX) 40 MG tablet Take 40 mg by mouth daily.     sennosides-docusate sodium (SENOKOT-S) 8.6-50 MG tablet Take 1 tablet by mouth at bedtime as needed for constipation.     traMADol (ULTRAM) 50 MG tablet Take 50 mg by mouth every 4 (four) hours as needed for moderate pain.      warfarin (COUMADIN) 3 MG tablet Take 3mg  daily except 1.5mg  on Tuesdays or as directed by Coumadin Clinic 35 tablet 1     Assessment: 80 y.o. female with history of atrial fibrillation  on PTA warfarin (home regimen 1.5 mg Tu/Sa/Su and 3 mg M/W/Th/F). Lower INR goal 1.8-2.2 due to history of ICH. Pharmacy consulted to dose warfarin.  Follow-up INR is subtherapeutic at 1.6, unchanged from admission after warfarin 5 mg given 8/12 and bridged with enoxaparin 60 mg. CBC and renal function stable.  Goal of Therapy:  INR 1.8-2.2   Plan:  Warfarin 3 mg today Enoxaparin bridge 80 mg SQ  Daily INR, CBC Bridge as needed per INR  Filbert Schilder, PharmD PGY1 Pharmacy Resident 05/11/2021  7:51 AM  Please check [email or web address] for unit-specific pharmacy phone numbers.

## 2021-05-11 NOTE — Progress Notes (Signed)
ANTICOAGULATION CONSULT NOTE - Initial Consult  Pharmacy Consult for Coumadin Indication: atrial fibrillation  Allergies  Allergen Reactions   Adhesive [Tape] Itching, Rash and Other (See Comments)    Redness and skin breaks down   Latex Rash   Neomycin-Bacitracin Zn-Polymyx Rash    Patient Measurements: Height: 5' (152.4 cm) Weight: 83.8 kg (184 lb 11.9 oz) IBW/kg (Calculated) : 45.5   Vital Signs: Temp: 97.4 F (36.3 C) (08/13 0224) Temp Source: Oral (08/13 0224) BP: 157/110 (08/13 0224) Pulse Rate: 75 (08/13 0224)  Labs: Recent Labs    05/10/21 1949 05/10/21 2023 05/10/21 2230  HGB 14.4 15.0  --   HCT 44.4 44.0  --   PLT 156  --   --   LABPROT 19.1*  --   --   INR 1.6*  --   --   CREATININE 1.38*  --   --   TROPONINIHS 34*  --  35*    Estimated Creatinine Clearance: 31.7 mL/min (A) (by C-G formula based on SCr of 1.38 mg/dL (H)).   Medical History: Past Medical History:  Diagnosis Date   A-fib [facility])    Not on coumadin due to intracebral hemorrhage   Anemia    Anxiety    Aortic insufficiency    s/p tissue AVR 2013   Automatic implantable cardiac defibrillator single-chamber-Medtronic 11/04/2011   Coronary artery disease 2013   non obstructive   DOE (dyspnea on exertion)    DVT (deep venous thrombosis) (HCC)    Exogenous obesity    Frequent falls    Frequent UTI    Heart murmur    prior AVR with asacending aortic root replacement August 2013   History of CHF (congestive heart failure)    History of gout    History of ventricular tachycardia    Hypertension    Myocardial infarction (HCC) 2000   Osteopenia 09/01/2013   Peripheral edema    Presence of IVC filter 2000   Renal insufficiency    Stroke (HCC) 2000    Medications:  No current facility-administered medications on file prior to encounter.   Current Outpatient Medications on File Prior to Encounter  Medication Sig Dispense Refill   acetaminophen (TYLENOL) 500 MG tablet Take 500 mg by  mouth every 6 (six) hours as needed for moderate pain.     allopurinol (ZYLOPRIM) 100 MG tablet TAKE 1 TABLET (100 MG TOTAL) BY MOUTH 2 (TWO) TIMES DAILY. 60 tablet 3   amLODipine (NORVASC) 10 MG tablet Take 1 tablet (10 mg total) by mouth daily. 90 tablet 3   aspirin EC 81 MG tablet Take 1 tablet (81 mg total) by mouth daily.     atorvastatin (LIPITOR) 20 MG tablet Take 20 mg by mouth daily.     bisoprolol (ZEBETA) 10 MG tablet Take 1 tablet (10 mg total) by mouth 2 (two) times daily. 180 tablet 3   furosemide (LASIX) 40 MG tablet Take 40 mg by mouth daily.     sennosides-docusate sodium (SENOKOT-S) 8.6-50 MG tablet Take 1 tablet by mouth at bedtime as needed for constipation.     traMADol (ULTRAM) 50 MG tablet Take 50 mg by mouth every 4 (four) hours as needed for moderate pain.      warfarin (COUMADIN) 3 MG tablet Take 3mg  daily except 1.5mg  on Tuesdays or as directed by Coumadin Clinic 35 tablet 1     Assessment: 80 y.o. female with h/o Afib to continue Coumadin.  Current regimen is Coumadin 1.5 mg Tue/Sat/Sun, and  3 mg Mon/Wed/Thu/Fri.    Goal of Therapy:  INR 1.8-2.2   Plan:  F/U daily INR  Eddie Candle 05/11/2021,3:25 AM

## 2021-05-11 NOTE — Plan of Care (Signed)

## 2021-05-11 NOTE — H&P (Signed)
History and Physical    [name] AXS:[id number] DOB: 1940-11-02 DOA: 05/10/2021  PCP: Silverio Decamp, MD   Patient coming from: Home  Chief Complaint: SOB, edema of legs  HPI: [name] is a 80 y.o. female with medical history significant for hypertension, hyperlipidemia, CHF with EF of 60%, atrial fibrillation, nonischemic cardiomyopathy s/p ICD placement, stroke who is on coumadin with INR goal of 1.8-2.2 per coumadin clinic notes. She presents with complaint of increased shortness of breath over the last few days.  She reports that she has had worsening shortness of breath even with mild exertion for the last 3 to 4 days.  She also reports she has had more difficulty sleeping and had to incline more in bed in order to sleep.  She reports she has woken up short of breath.  She states she has had any chest pressure intermittently in the last few days.  She has had increased swelling in her legs.  She reports has been taking all her medications as prescribed.  With her children who help organize and manage her medications.  She reports that she used to be on oxygen but she had improvement about a year ago so she stopped using it.  She reports in the last few weeks she has taken out her oxygen tanks and been using 2 L of oxygen nasal cannula which started at night but is the last 2 days has been 24 hours a day.  She states she has not had any fever or chills.  She denies any nausea, vomiting, diarrhea.  She has had her COVID-19 vaccine and booster.  She has no known sick contacts or COVID-19 exposures.  ED Course: In the emergency room she was found to have an elevated blood pressure as well as an elevated BNP.  It was mildly elevated.  She was given Lasix IV in the emergency room which did help her breathe better she states.  ABG showed mild hypercapnia but patient was alert and oriented and able answer questions appropriately.  CBC was unremarkable.  BNP is 845, troponin 34, sodium 144  potassium 3.4 chloride 99 bicarb 34 creatinine 1.38 BUN 24 alk phos is 58 AST 29 ALT 20.  Lactic acid 0.8.  INR was subtherapeutic at 1.6.  COVID-19 swab is negative.  Influenza A and B are negative.  Chest x-ray reveals cardiomegaly with pulmonary vascular congestion.  Patient is admitted to the hospitalist service on cardiac telemetry floor  Review of Systems:  General: Denies fever, chills, weight loss, night sweats.  Denies dizziness. Denies change in appetite HENT: Denies head trauma, headache, denies change in hearing, tinnitus. Denies nasal congestion or bleeding. Denies sore throat.  Denies difficulty swallowing Eyes: Denies blurry vision, pain in eye, drainage.  Denies discoloration of eyes. Neck: Denies pain.  Denies swelling.  Denies pain with movement. Cardiovascular: Denies chest pain, palpitations. Reports edema. Reports orthopnea Respiratory: Reports shortness of breath. Denies cough.  Denies wheezing.  Denies sputum production Gastrointestinal: Denies abdominal pain, swelling.  Denies nausea, vomiting, diarrhea.  Denies melena.  Denies hematemesis. Musculoskeletal: Denies limitation of movement.  Denies deformity or swelling.  Denies pain.  Denies arthralgias or myalgias. Genitourinary: Denies pelvic pain.  Denies urinary frequency or hesitancy.  Denies dysuria.  Skin: Denies rash.  Denies petechiae, purpura, ecchymosis. Neurologic: Denies syncope. Denies seizure activity. Denies paresthesia. Denies slurred speech, drooping face.  Denies visual change. Psychiatric: Denies depression, anxiety. Denies hallucinations.  Past Medical History:  Diagnosis Date  A-fib [facility])    Not on coumadin due to intracebral hemorrhage   Anemia    Anxiety    Aortic insufficiency    s/p tissue AVR 2013   Automatic implantable cardiac defibrillator single-chamber-[employer] 11/04/2011   Coronary artery disease 2013   non obstructive   DOE (dyspnea on exertion)    DVT (deep venous thrombosis) (HCC)     Exogenous obesity    Frequent falls    Frequent UTI    Heart murmur    prior AVR with asacending aortic root replacement August 2013   History of CHF (congestive heart failure)    History of gout    History of ventricular tachycardia    Hypertension    Myocardial infarction ([place]) 2000   Osteopenia 09/01/2013   Peripheral edema    Presence of IVC filter 2000   Renal insufficiency    Stroke ([place]) 2000    Past Surgical History:  Procedure Laterality Date   AORTIC VALVE REPLACEMENT  04/12/2012   Procedure: AORTIC VALVE REPLACEMENT (AVR);  Surgeon: Grace Isaac, MD;  Location: [place];  Service: Open Heart Surgery;  Laterality: N/A;  with nitric oxide   CARDIAC CATHETERIZATION  7/13   non obstructive CAD   CARDIAC DEFIBRILLATOR PLACEMENT  2013   MDT ICD   CATARACT EXTRACTION     clot removal  5.2013   blood clot removed from the back of the left leg   EMBOLECTOMY  02/15/2012   Procedure: EMBOLECTOMY;  Surgeon: Angelia Mould, MD;  Location: [place];  Service: Vascular;  Laterality: Left;  left popliteal embolectomy with drain placement   EYE SURGERY  2012   removal of cataracts bilaterally   LEFT HEART CATHETERIZATION WITH CORONARY ANGIOGRAM N/A 04/08/2012   Procedure: LEFT HEART CATHETERIZATION WITH CORONARY ANGIOGRAM;  Surgeon: Sherren Mocha, MD;  Location: [facility] CATH LAB;  Service: Cardiovascular;  Laterality: N/A;   STOMACH SURGERY     lap band placed and later removed   THORACIC AORTIC ANEURYSM REPAIR  04/12/2012   Procedure: THORACIC ASCENDING ANEURYSM REPAIR (AAA);  Surgeon: Grace Isaac, MD;  Location: [place];  Service: Open Heart Surgery;  Laterality: N/A;   TONSILLECTOMY      Social History  reports that she has never smoked. She has never used smokeless tobacco. She reports that she does not drink alcohol and does not use drugs.  Allergies  Allergen Reactions   Adhesive [Tape] Itching, Rash and Other (See Comments)    Redness and skin breaks down   Latex  Rash   Neomycin-Bacitracin Zn-Polymyx Rash    Family History  Problem Relation Age of Onset   Hypertension Father    Cancer Father    Coronary artery disease Father 27   Cancer Son        pancreas, spine   Dementia Neg Hx      Prior to Admission medications   Medication Sig Start Date End Date Taking? Authorizing Provider  acetaminophen (TYLENOL) 500 MG tablet Take 500 mg by mouth every 6 (six) hours as needed for moderate pain.    [provider]  allopurinol (ZYLOPRIM) 100 MG tablet TAKE 1 TABLET (100 MG TOTAL) BY MOUTH 2 (TWO) TIMES DAILY. 09/16/16   Debbrah Alar, NP  amLODipine (NORVASC) 10 MG tablet Take 1 tablet (10 mg total) by mouth daily. 07/28/16   Nita Sells, MD  aspirin EC 81 MG tablet Take 1 tablet (81 mg total) by mouth daily. 08/18/16   Debbrah Alar, NP  atorvastatin (LIPITOR) 20 MG tablet Take 20 mg by mouth daily.    [provider]  bisoprolol (ZEBETA) 10 MG tablet Take 1 tablet (10 mg total) by mouth 2 (two) times daily. 04/16/16   Deboraha Sprang, MD  furosemide (LASIX) 40 MG tablet Take 40 mg by mouth daily.    [provider]  sennosides-docusate sodium (SENOKOT-S) 8.6-50 MG tablet Take 1 tablet by mouth at bedtime as needed for constipation.    [provider]  traMADol (ULTRAM) 50 MG tablet Take 50 mg by mouth every 4 (four) hours as needed for moderate pain.     [provider]  warfarin (COUMADIN) 3 MG tablet Take $RemoveBef'3mg'sKSROwxnxU$  daily except 1.$RemoveBefore'5mg'pRHlKHrWumszA$  on Tuesdays or as directed by Coumadin Clinic 08/28/16   Jerline Pain, MD    Physical Exam: Vitals:   05/10/21 2100 05/10/21 2310 05/11/21 0224 05/11/21 0229  BP: (!) 172/116 (!) 163/114 (!) 157/110   Pulse: (!) 38 70 75   Resp: (!) 23 (!) 27 20   Temp:   (!) 97.4 F (36.3 C)   TempSrc:   Oral   SpO2: 100% 100% 92%   Weight:    83.8 kg  Height:    5' (1.524 m)    Constitutional: NAD, calm, comfortable Vitals:   05/10/21 2100 05/10/21 2310  05/11/21 0224 05/11/21 0229  BP: (!) 172/116 (!) 163/114 (!) 157/110   Pulse: (!) 38 70 75   Resp: (!) 23 (!) 27 20   Temp:   (!) 97.4 F (36.3 C)   TempSrc:   Oral   SpO2: 100% 100% 92%   Weight:    83.8 kg  Height:    5' (1.524 m)   General: WDWN, Alert and oriented x3.  Eyes: EOMI, PERRL, conjunctivae normal.  Sclera nonicteric HENT:  [place]/AT, external ears normal. Nares patent without epistasis.  Mucous membranes are moist. Posterior pharynx clear Neck: Soft, normal range of motion, supple, no masses, no thyromegaly.  Trachea midline Respiratory: clear to auscultation bilaterally, no wheezing, no crackles. Normal respiratory effort. No accessory muscle use.  Cardiovascular: Irregularly irregular rhythm, normal rate, no murmurs / rubs / gallops. Has 1-2 + lower extremity edema. 1+ pedal pulses.  Abdomen: Soft, no tenderness, nondistended, no rebound or guarding.  No masses palpated. Bowel sounds normoactive Musculoskeletal: FROM. no cyanosis. No joint deformity upper and lower extremities. Normal muscle tone.  Skin: Warm, dry, intact no rashes, lesions, ulcers. No induration Neurologic: CN 2-12 grossly intact.  Normal speech.  Sensation intact to touch. Strength 5/5 in all extremities.   Psychiatric: Normal judgment and insight.  Normal mood.    Labs on Admission: I have personally reviewed following labs and imaging studies  CBC: Recent Labs  Lab 05/10/21 1949 05/10/21 2023  WBC 6.6  --   NEUTROABS 3.6  --   HGB 14.4 15.0  HCT 44.4 44.0  MCV 100.7*  --   PLT 156  --     Basic Metabolic Panel: Recent Labs  Lab 05/10/21 1949 05/10/21 2023  NA 144 145  K 3.4* 3.4*  CL 99  --   CO2 34*  --   GLUCOSE 105*  --   BUN 24*  --   CREATININE 1.38*  --   CALCIUM 9.3  --     GFR: Estimated Creatinine Clearance: 31.7 mL/min (A) (by C-G formula based on SCr of 1.38 mg/dL (H)).  Liver Function Tests: Recent Labs  Lab 05/10/21 1949  AST 29  ALT 20  ALKPHOS 58  BILITOT  0.6  PROT 8.0  ALBUMIN 4.2    Urine analysis:    Component Value Date/Time   COLORURINE YELLOW 07/25/2016 1820   APPEARANCEUR CLOUDY (A) 07/25/2016 1820   LABSPEC 1.013 07/25/2016 1820   PHURINE 5.0 07/25/2016 1820   GLUCOSEU NEGATIVE 07/25/2016 1820   HGBUR TRACE (A) 07/25/2016 1820   BILIRUBINUR NEGATIVE 07/25/2016 1820   BILIRUBINUR neg 07/18/2016 1602   KETONESUR NEGATIVE 07/25/2016 1820   PROTEINUR NEGATIVE 07/25/2016 1820   UROBILINOGEN 0.2 07/18/2016 1602   UROBILINOGEN 0.2 06/15/2015 0825   NITRITE NEGATIVE 07/25/2016 1820   LEUKOCYTESUR MODERATE (A) 07/25/2016 1820    Radiological Exams on Admission: DG Chest Port 1 View  Result Date: 05/10/2021 CLINICAL DATA:  Shortness of breath for 1 week, worse today. EXAM: PORTABLE CHEST 1 VIEW COMPARISON:  Two-view chest x-ray 07/25/2016 FINDINGS: Heart is enlarged. Atherosclerotic changes are noted at the aortic arch. Patient is status post aortic valve replacement and atrial clipping. AICD is in place. Lung volumes are low. Moderate pulmonary vascular congestion is present. No focal airspace disease is evident. IMPRESSION: Cardiomegaly and moderate pulmonary vascular congestion without frank edema. Electronically Signed   By: San Morelle M.D.   On: 05/10/2021 20:11    EKG: Independently reviewed.  EKG is reviewed and shows atrial fibrillation with occasional PVCs.  No acute ST elevation or depression.  QTc prolonged at 510  Assessment/Plan Principal Problem:   Acute on chronic diastolic CHF (congestive heart failure)  [name] is admitted to Cardiac telemetry floor.  Diuresis with lasix. Monitor daily weight and I&Os.  Obtain Echo in am to valuate EF, wall motion and valvular function. Continue metoprolol, statin. Serial troponins will be monitored overnight Patient may benefit from addition of Entresto to her regimen. Low dose entresto initiated.   Active Problems:   Essential hypertension Continue Norvasc,  metoprolol.  Monitor blood pressure.  Had nitroglycerin patch applied in the emergency room which is still in place.    CKD (chronic kidney disease), stage III  Stable.  Monitor renal function electrolytes with labs in the morning    Chronic atrial fibrillation  Stable.  Continue bisoprolol    Chronic anticoagulation- Coumadin INR was low at 1.6.  Pharmacy monitoring and dosing Coumadin.  Was given extra dose of Coumadin in the emergency room    Prolonged QT interval Avoid medications which can further prolong QT interval.  Monitor on telemetry.    Subtherapeutic anticoagulation Pharmacy to monitor INR and assist with dosage of coumadin. Will need Lovenox while INR is subtherapeutic    DVT prophylaxis: Pt was given dose of Lovenox in ER as INR was subtherapeutic. Will monitor INR and if still subtherapeutic will need lovenox during day  Code Status:   Full Code  Family Communication:  Diagnosis and plan discussed with [name].  She verbalizes understanding and agrees with plan.  Further recommendations to follow as clinical indicated Disposition Plan:   Patient is from:  Home  Anticipated DC to:  Home  Anticipated DC date:  Anticipate 2 midnight or more stay in hospital to treat acute condition.   Anticipated DC barriers: No barriers identified  Consults called:  Pharmacy consulted to dose coumadin and monitor INR  Admission status:  Inpatient   Yevonne Aline Lavanya Roa MD [facility]  How to contact the [facility] Attending or Consulting provider [place] or covering provider during after hours [place], for this patient?   Check the  care team in [facility] and look for a) attending/consulting [place] provider listed and b) the [facility] team listed Log into [email or web address] and use [place]'s universal password to access. If you do not have the password, please contact the hospital operator. Locate the [facility] provider you are looking for under [facility] and page to a number that you can be  directly reached. If you still have difficulty reaching the provider, please page the [facility] (Director on Call) for the Hospitalists listed on amion for assistance.  05/11/2021, 3:11 AM

## 2021-05-11 NOTE — Plan of Care (Signed)
  Problem: Education: Goal: Knowledge of General Education information will improve Description: Including pain rating scale, medication(s)/side effects and non-pharmacologic comfort measures Outcome: Progressing   Problem: Elimination: Goal: Will not experience complications related to bowel motility Outcome: Progressing Goal: Will not experience complications related to urinary retention Outcome: Progressing   Problem: Education: Goal: Ability to demonstrate management of disease process will improve Outcome: Progressing Goal: Ability to verbalize understanding of medication therapies will improve Outcome: Progressing Goal: Individualized Educational Video(s) Outcome: Progressing   Problem: Activity: Goal: Capacity to carry out activities will improve Outcome: Progressing

## 2021-05-11 NOTE — Progress Notes (Signed)
  Echocardiogram 2D Echocardiogram has been performed.  Delcie Roch 05/11/2021, 4:03 PM

## 2021-05-11 NOTE — Progress Notes (Signed)
PROGRESS NOTE    SHAROL CROGHAN  QKS:[id number] DOB: December 22, 1940 DOA: 05/10/2021 PCP: Silverio Decamp, MD    Brief Narrative:  Mrs. Molino was admitted to the hospital with the working diagnosis of decompensated heart failure.   80 year old female past medical history for hypertension, dyslipidemia, heart failure, atrial fibrillation, and CVA who presents with dyspnea.  Reported 3-4 days of worsening dyspnea on exertion, positive PND/orthopnea along with worsening lower extremity edema on her initial physical examination blood pressure 172/116, heart rate 75, respiratory rate 27, temperature 97.4, oxygen saturation 92% on supplemental oxygen, her lungs had no wheezing, heart S1-S2, irregularly irregular, abdomen soft, nontender, positive pitting bilateral extremity edema.  Sodium 144, potassium 3.4, chloride 99, bicarb 34, glucose 105, BUN 24, creatinine 1.38, BNP 845, high sensitive troponin 34-35-33-35, white count 6.6, hemoglobin 14.4, hematocrit 44.4, platelets 156.  INR 1.6. SARS COVID-19 negative.  Chest radiograph with bilateral hilar vascular congestion.  EKG 67 bpm, left axis deviation, left anterior fascicular block, QTC 510, sinus rhythm with PVCs, Q-wave V1-V3, no significant ST segment or T wave changes   Assessment & Plan:   Principal Problem:   Acute on chronic diastolic CHF (congestive heart failure) (HCC) Active Problems:   Essential hypertension   CKD (chronic kidney disease), stage III (HCC)   Chronic atrial fibrillation (HCC)   Chronic anticoagulation- Coumadin   Prolonged QT interval   Subtherapeutic anticoagulation   Acute on chronic heart failure decompensation./ SP bioprosthetic aortic valve. Prolonged QTc  Patient continue to be hypervolemic. Urine output documented 700 ml. Positive lower extremity edema, JVD and rales on lung examination.  Plan to continue diuresis with furosemide, will increase dose to 60 mg TID. Continue with metoprolol and entresto.    Blood pressure control with amlodipine   Echocardiogram from 2015 with LV EF 50 to 55%, mild dilatation of RV with PA peak pressure 40 mmHg.   Follow with new echocardiogram. Old records personally reviewed, follows with cardiology at [facility], visit in 06/22 increase furosemide to 80 mg in am, for volume overload.   2. Paroxysmal atrial fibrillation. Continue rate control with metoprolol and anticoagulation with warfarin INR 1.6. bridge with enoxaparin for today.   3. CKD stage 3b renal function with serum cr at 1,32, with K at 3,4 and serum bicarbonate at 31. Follow up renal function in am, continue diuresis with furosemide.   4. Obesity class 2. Calculated BMI is 36.0   5. Dyslipidemia. Continue with statin therapy.   6. Gout. No acute flare, continue with allopurinol  Patient continue to be at high risk for worsening heart failure   Status is: Inpatient  Remains inpatient appropriate because:Inpatient level of care appropriate due to severity of illness  Dispo: The patient is from: Home              Anticipated d/c is to: Home              Patient currently is not medically stable to d/c.   Difficult to place patient No   DVT prophylaxis: Warfarin   Code Status:   full  Family Communication:   I spoke over the phone with the patient's daughter about patient's  condition, plan of care, prognosis and all questions were addressed.    Subjective: Patient continue to have dyspnea and lower extremity edema, mild improvement from admission but not yet back to baseline, no nausea or vomiting, and no chest pain  Objective: Vitals:   05/11/21 5974 05/11/21 0229 05/11/21 0356  05/11/21 0822  BP: (!) 157/110  (!) 161/87 (!) 141/78  Pulse: 75  84 (!) 56  Resp: 20  18   Temp: (!) 97.4 F (36.3 C)  97.8 F (36.6 C) 97.7 F (36.5 C)  TempSrc: Oral  Oral   SpO2: 92%  93% 100%  Weight:  83.8 kg    Height:  5' (1.524 m)      Intake/Output Summary (Last 24 hours) at  05/11/2021 1353 Last data filed at 05/11/2021 0900 Gross per 24 hour  Intake 360 ml  Output 700 ml  Net -340 ml   Filed Weights   05/10/21 1935 05/11/21 0229  Weight: 83.9 kg 83.8 kg    Examination:   General: Not in pain or dyspnea, deconditioned  Neurology: Awake and alert, non focal  E ENT: mild pallor, no icterus, oral mucosa moist Cardiovascular: No JVD. S1-S2 present, rhythmic, no gallops, rubs, or murmurs. +++ bilateral lower extremity edema. Pulmonary: positive breath sounds bilaterally, with no wheezing, or rhonchi , positive rales. Gastrointestinal. Abdomen soft and non tender Skin. No rashes Musculoskeletal: no joint deformities     Data Reviewed: I have personally reviewed following labs and imaging studies  CBC: Recent Labs  Lab 05/10/21 1949 05/10/21 2023 05/11/21 0313  WBC 6.6  --  8.1  NEUTROABS 3.6  --   --   HGB 14.4 15.0 12.6  HCT 44.4 44.0 39.6  MCV 100.7*  --  102.1*  PLT 156  --  147*   Basic Metabolic Panel: Recent Labs  Lab 05/10/21 1949 05/10/21 2023 05/11/21 0313  NA 144 145 141  K 3.4* 3.4* 3.4*  CL 99  --  101  CO2 34*  --  31  GLUCOSE 105*  --  97  BUN 24*  --  22  CREATININE 1.38*  --  1.32*  CALCIUM 9.3  --  8.9   GFR: Estimated Creatinine Clearance: 33.2 mL/min (A) (by C-G formula based on SCr of 1.32 mg/dL (H)). Liver Function Tests: Recent Labs  Lab 05/10/21 1949  AST 29  ALT 20  ALKPHOS 58  BILITOT 0.6  PROT 8.0  ALBUMIN 4.2   No results for input(s): LIPASE, AMYLASE in the last 168 hours. No results for input(s): AMMONIA in the last 168 hours. Coagulation Profile: Recent Labs  Lab 05/10/21 1949 05/11/21 0313  INR 1.6* 1.6*   Cardiac Enzymes: No results for input(s): CKTOTAL, CKMB, CKMBINDEX, TROPONINI in the last 168 hours. BNP (last 3 results) No results for input(s): PROBNP in the last 8760 hours. HbA1C: No results for input(s): HGBA1C in the last 72 hours. CBG: No results for input(s): GLUCAP in  the last 168 hours. Lipid Profile: No results for input(s): CHOL, HDL, LDLCALC, TRIG, CHOLHDL, LDLDIRECT in the last 72 hours. Thyroid Function Tests: No results for input(s): TSH, T4TOTAL, FREET4, T3FREE, THYROIDAB in the last 72 hours. Anemia Panel: No results for input(s): VITAMINB12, FOLATE, FERRITIN, TIBC, IRON, RETICCTPCT in the last 72 hours.    Radiology Studies: I have reviewed all of the imaging during this hospital visit personally     Scheduled Meds:  amLODipine  10 mg Oral Daily   aspirin EC  81 mg Oral Daily   atorvastatin  20 mg Oral Daily   enoxaparin (LOVENOX) injection  80 mg Subcutaneous Q12H   furosemide  60 mg Intravenous BID   metoprolol succinate  100 mg Oral Daily   nitroGLYCERIN  1 inch Topical Q6H   sacubitril-valsartan  1 tablet Oral  BID   sodium chloride flush  3 mL Intravenous Q12H   Warfarin - Pharmacist Dosing Inpatient   Does not apply q1600   Continuous Infusions:  sodium chloride       LOS: 0 days        Sylvester Salonga Annett Gula, MD

## 2021-05-12 DIAGNOSIS — I5033 Acute on chronic diastolic (congestive) heart failure: Secondary | ICD-10-CM | POA: Diagnosis not present

## 2021-05-12 DIAGNOSIS — N1832 Chronic kidney disease, stage 3b: Secondary | ICD-10-CM | POA: Diagnosis not present

## 2021-05-12 DIAGNOSIS — Z7901 Long term (current) use of anticoagulants: Secondary | ICD-10-CM | POA: Diagnosis not present

## 2021-05-12 DIAGNOSIS — R9431 Abnormal electrocardiogram [ECG] [EKG]: Secondary | ICD-10-CM | POA: Diagnosis not present

## 2021-05-12 DIAGNOSIS — R0602 Shortness of breath: Secondary | ICD-10-CM | POA: Diagnosis not present

## 2021-05-12 DIAGNOSIS — I130 Hypertensive heart and chronic kidney disease with heart failure and stage 1 through stage 4 chronic kidney disease, or unspecified chronic kidney disease: Secondary | ICD-10-CM | POA: Diagnosis not present

## 2021-05-12 LAB — BASIC METABOLIC PANEL
Anion gap: 11 (ref 5–15)
BUN: 20 mg/dL (ref 8–23)
CO2: 36 mmol/L — ABNORMAL HIGH (ref 22–32)
Calcium: 9.2 mg/dL (ref 8.9–10.3)
Chloride: 97 mmol/L — ABNORMAL LOW (ref 98–111)
Creatinine, Ser: 1.29 mg/dL — ABNORMAL HIGH (ref 0.44–1.00)
GFR, Estimated: 42 mL/min — ABNORMAL LOW (ref >60)
Glucose, Bld: 85 mg/dL (ref 70–99)
Potassium: 3.2 mmol/L — ABNORMAL LOW (ref 3.5–5.1)
Sodium: 144 mmol/L (ref 135–145)

## 2021-05-12 LAB — PROTIME-INR
INR: 2.2 — ABNORMAL HIGH (ref 0.8–1.2)
Prothrombin Time: 24 seconds — ABNORMAL HIGH (ref 11.4–15.2)

## 2021-05-12 LAB — CBC
HCT: 40.9 % (ref 36.0–46.0)
Hemoglobin: 13 g/dL (ref 12.0–15.0)
MCH: 32.1 pg (ref 26.0–34.0)
MCHC: 31.8 g/dL (ref 30.0–36.0)
MCV: 101 fL — ABNORMAL HIGH (ref 80.0–100.0)
Platelets: 138 10*3/uL — ABNORMAL LOW (ref 150–400)
RBC: 4.05 MIL/uL (ref 3.87–5.11)
RDW: 14.2 % (ref 11.5–15.5)
WBC: 5.6 10*3/uL (ref 4.0–10.5)
nRBC: 0 % (ref 0.0–0.2)

## 2021-05-12 MED ORDER — WARFARIN SODIUM 1 MG PO TABS
1.5000 mg | ORAL_TABLET | Freq: Once | ORAL | Status: DC
  Filled 2021-05-12: qty 1

## 2021-05-12 MED ORDER — POTASSIUM CHLORIDE CRYS ER 20 MEQ PO TBCR
40.0000 meq | EXTENDED_RELEASE_TABLET | ORAL | Status: AC
  Administered 2021-05-12 (×2): 40 meq via ORAL
  Filled 2021-05-12 (×2): qty 2

## 2021-05-12 MED ORDER — WARFARIN SODIUM 1 MG PO TABS
1.5000 mg | ORAL_TABLET | Freq: Once | ORAL | Status: AC
  Administered 2021-05-12: 1.5 mg via ORAL
  Filled 2021-05-12: qty 1

## 2021-05-12 NOTE — Evaluation (Signed)
Occupational Therapy Evaluation Patient Details Name: [name] MRN: [id number] DOB: 1941-01-29 Today's Date: 05/12/2021    History of Present Illness 80 year old female  with dx of decompensated heart failure. PMHx: HTN, DLD, heart failure, atrial fibrillation, and CVA who presents with dyspnea.   Clinical Impression   Pt PTA: Pt living amongst family members and well cared for. Pt reports performing BADLs with independence and iADL tasks with assist as needed. Pt with decreased activity tolerance and increased dyspnea. Pt performing minguardA for standing OOB ADL tasks with increased time, but skilled assist required after set-upA.  Pt's O2 remained >90% on 2L O2, HR 69 BPM with exertion. Pt would benefit from continued OT skilled services. OT following acutely.     Follow Up Recommendations  No OT follow up    Equipment Recommendations  None recommended by OT    Recommendations for Other Services       Precautions / Restrictions Precautions Precautions: Other (comment) Precaution Comments: O2 on 2L Restrictions Weight Bearing Restrictions: No      Mobility Bed Mobility Overal bed mobility: Modified Independent             General bed mobility comments: performing own bed mobility without physical assist. increased time    Transfers Overall transfer level: Needs assistance Equipment used: 1 person hand held assist Transfers: Sit to/from Stand;Stand Pivot Transfers Sit to Stand: Min guard Stand pivot transfers: Min guard       General transfer comment: Pt prefers no AD, but x1 handheld assist.    Balance Overall balance assessment: Needs assistance Sitting-balance support: Feet supported;No upper extremity supported Sitting balance-Leahy Scale: Good     Standing balance support: Single extremity supported;During functional activity Standing balance-Leahy Scale: Poor Standing balance comment: balance when challenged required at least +1 UE  supported.                           ADL either performed or assessed with clinical judgement   ADL Overall ADL's : Modified independent;At baseline                                       General ADL Comments: Pt performing OOB ADL tasks with increased time, but skilled assist required after set-upA. Pt standing at commode for anterior and posterior pericare; standing at sink for grooming and pt reports urinary incontinence so pure wick replaced. Pt's O2 remained >90% on 2L O2 with exertion.     Vision Baseline Vision/History: Wears glasses Wears Glasses: Reading only Patient Visual Report: No change from baseline Vision Assessment?: No apparent visual deficits     Perception     Praxis      Pertinent Vitals/Pain Pain Assessment: No/denies pain     Hand Dominance Right   Extremity/Trunk Assessment Upper Extremity Assessment Upper Extremity Assessment: Overall WFL for tasks assessed   Lower Extremity Assessment Lower Extremity Assessment: Generalized weakness   Cervical / Trunk Assessment Cervical / Trunk Assessment: Normal   Communication Communication Communication: HOH   Cognition Arousal/Alertness: Awake/alert Behavior During Therapy: WFL for tasks assessed/performed Overall Cognitive Status: Within Functional Limits for tasks assessed                                     General Comments  O2 >90%  on RA    Exercises     Shoulder Instructions      Home Living Family/patient expects to be discharged to:: Private residence Living Arrangements: Children Available Help at Discharge: Family;Available 24 hours/day Type of Home: House       Home Layout: Able to live on main level with bedroom/bathroom               Home Equipment: Dan Humphreys - 2 wheels;Bedside commode;Shower seat;Wheelchair - Sport and exercise psychologist Comments: Pt will discharge to 1/4 children's home.      Prior  Functioning/Environment Level of Independence: Independent        Comments: Pt can perform own BADL. Assist for IADL- medication management. Each  of her 4 children have a room fixed on the main level to assist with her needs and house her for a short period of time and they rotate.        OT Problem List: Decreased activity tolerance      OT Treatment/Interventions: Self-care/ADL training;Energy conservation;Therapeutic activities;Patient/family education;Balance training    OT Goals(Current goals can be found in the care plan section) Acute Rehab OT Goals Patient Stated Goal: to go home OT Goal Formulation: With patient Time For Goal Achievement: 05/26/21 Potential to Achieve Goals: Good  OT Frequency: Min 2X/week   Barriers to D/C:            Co-evaluation              AM-PAC OT "6 Clicks" Daily Activity     Outcome Measure Help from another person eating meals?: None Help from another person taking care of personal grooming?: A Little Help from another person toileting, which includes using toliet, bedpan, or urinal?: A Little Help from another person bathing (including washing, rinsing, drying)?: A Little Help from another person to put on and taking off regular upper body clothing?: None Help from another person to put on and taking off regular lower body clothing?: A Little 6 Click Score: 20   End of Session Nurse Communication: Mobility status  Activity Tolerance: Patient tolerated treatment well Patient left: in chair;with call bell/phone within reach;with family/visitor present  OT Visit Diagnosis: Unsteadiness on feet (R26.81);Muscle weakness (generalized) (M62.81)                Time: 7902-4097 OT Time Calculation (min): 34 min Charges:  OT General Charges $OT Visit: 1 Visit OT Evaluation $OT Eval Moderate Complexity: 1 Mod OT Treatments $Self Care/Home Management : 8-22 mins  Flora Lipps, OTR/L Acute Rehabilitation Services Pager:  [phone] Office: [phone]   Bayden Gil C 05/12/2021, 1:36 PM

## 2021-05-12 NOTE — Progress Notes (Signed)
ANTICOAGULATION CONSULT NOTE - Initial Consult  Pharmacy Consult for Coumadin Indication: atrial fibrillation  Allergies  Allergen Reactions   Adhesive [Tape] Itching, Rash and Other (See Comments)    Redness and skin breaks down   Latex Rash    Patient Measurements: Height: 5' (152.4 cm) Weight: 81.8 kg (180 lb 5.4 oz) IBW/kg (Calculated) : 45.5   Vital Signs: Temp: 97.5 F (36.4 C) (08/14 0430) Temp Source: Oral (08/14 0430) BP: 150/94 (08/14 0430) Pulse Rate: 67 (08/14 0430)  Labs: Recent Labs    05/10/21 1949 05/10/21 2023 05/10/21 2230 05/11/21 0313 05/11/21 0706 05/12/21 0402  HGB 14.4 15.0  --  12.6  --  13.0  HCT 44.4 44.0  --  39.6  --  40.9  PLT 156  --   --  147*  --  138*  LABPROT 19.1*  --   --  19.2*  --  24.0*  INR 1.6*  --   --  1.6*  --  2.2*  CREATININE 1.38*  --   --  1.32*  --  1.29*  TROPONINIHS 34*  --  35* 33* 35*  --      Estimated Creatinine Clearance: 33.5 mL/min (A) (by C-G formula based on SCr of 1.29 mg/dL (H)).   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   Current Outpatient Medications on  File Prior to Encounter  Medication Sig Dispense Refill   allopurinol (ZYLOPRIM) 100 MG tablet TAKE 1 TABLET (100 MG TOTAL) BY MOUTH 2 (TWO) TIMES DAILY. (Patient taking differently: Take 50 mg by mouth daily.) 60 tablet 3   aspirin EC 81 MG tablet Take 1 tablet (81 mg total) by mouth daily.     Biotin w/ Vitamins C & E (HAIR/SKIN/NAILS PO) Take 1 tablet by mouth daily.     calcium carbonate (OS-CAL) 600 MG TABS tablet Take 600 mg by mouth daily.     Cholecalciferol 50 MCG (2000 UT) CAPS Take 2 capsules by mouth daily.     furosemide (LASIX) 40 MG tablet Take 40 mg by mouth daily.     metoprolol succinate (TOPROL-XL) 100 MG 24 hr tablet Take 100 mg by mouth at bedtime.     rosuvastatin (CRESTOR) 20 MG tablet Take 20 mg by mouth at bedtime.     warfarin (COUMADIN) 3 MG tablet Take 3mg  daily except 1.5mg  on Tuesdays or as directed by Coumadin Clinic (Patient taking differently: Take 1.5-3 mg by mouth See admin instructions. Take 1/2 tablet (1.5mg ) by mouth daily  on Tuesdays, Saturdays, and Sundays. Take 1 tablet (3mg ) by mouth all other days. Take in the evening.) 35 tablet 1   amLODipine (NORVASC) 10 MG tablet Take 1 tablet (10 mg total) by mouth daily. (Patient not taking: Reported on 05/11/2021) 90 tablet 3   atorvastatin (LIPITOR) 20 MG tablet Take 20 mg by mouth daily. (Patient not taking: Reported on 05/11/2021)     bisoprolol (ZEBETA) 10 MG tablet Take 1 tablet (10 mg total) by mouth 2 (two) times daily. (Patient not taking: Reported on 05/11/2021) 180 tablet 3     Assessment: 80 y.o. female with history of atrial fibrillation on PTA warfarin (home regimen 1.5 mg Tu/Sa/Su and 3 mg M/W/Th/F). Lower INR goal 1.8-2.2 due to history of ICH. Pharmacy consulted to dose warfarin.  INR is now therapeutic at 2.2 after bridging with enoxaparin for two days. CBC and renal function stable.  Goal of Therapy:  INR 1.8-2.2   Plan:  Warfarin 1.5 mg today Daily INR, CBC Monitor for s/sx of  bleeding  77, PharmD PGY1 Pharmacy Resident 05/12/2021  8:36 AM  Please check [email or web address] for unit-specific pharmacy phone numbers.

## 2021-05-12 NOTE — Progress Notes (Signed)
PROGRESS NOTE    [name]  IFO:[id number] DOB: 05/21/1941 DOA: 05/10/2021 PCP: Silverio Decamp, MD    Brief Narrative:  [name] was admitted to the hospital with the working diagnosis of decompensated heart failure.    80 year old female past medical history for hypertension, dyslipidemia, heart failure, atrial fibrillation, and CVA who presents with dyspnea.  Reported 3-4 days of worsening dyspnea on exertion, positive PND/orthopnea along with worsening lower extremity edema on her initial physical examination blood pressure 172/116, heart rate 75, respiratory rate 27, temperature 97.4, oxygen saturation 92% on supplemental oxygen, her lungs had no wheezing, heart S1-S2, irregularly irregular, abdomen soft, nontender, positive pitting bilateral extremity edema.   Sodium 144, potassium 3.4, chloride 99, bicarb 34, glucose 105, BUN 24, creatinine 1.38, BNP 845, high sensitive troponin 34-35-33-35, white count 6.6, hemoglobin 14.4, hematocrit 44.4, platelets 156.  INR 1.6. SARS COVID-19 negative.   Chest radiograph with bilateral hilar vascular congestion.   EKG 67 bpm, left axis deviation, left anterior fascicular block, QTC 510, sinus rhythm with PVCs, Q-wave V1-V3, no significant ST segment or T wave changes   Patient placed on IV furosemide for diuresis.   Apparently poor dietary compliance at home.  Old records personally reviewed, follows with cardiology at [facility], visit in 06/22 increase furosemide to 80 mg in am, for volume overload.   Assessment & Plan:   Principal Problem:   Acute on chronic diastolic CHF (congestive heart failure) (HCC) Active Problems:   Essential hypertension   CKD (chronic kidney disease), stage III (HCC)   Chronic atrial fibrillation (HCC)   Chronic anticoagulation- Coumadin   Prolonged QT interval   Subtherapeutic anticoagulation   Acute on chronic diastolic heart failure decompensation./ RV failure with pulmonary HTN. (Acute on  chronic core pulmonale) SP bioprosthetic aortic valve. Prolonged QTc  Improving edema but not yet back to baseline. Urine output over last 24 hrs 1,900 ml.  Systolic blood pressure 104 to 150 mmHg   Diuresis with furosemide IV 60 mg TID. Heart failure management with metoprolol and entresto.   Amlodipine for blood pressure control.    2015 LV EF 50 to 55%, mild dilatation of RV with PA peak pressure 40 mmHg. Follow up echocardiogram with EF 45 to 50% on the LV, with global hypokinesis, severe reduction in RV systolic pressure, severe enlarged RV cavity, RSVP 39,54 mmHg. Severe dilatation biatrial. TR mild to moderate. Aortic valve prosthesis with adequate function.     Out of bed to chair tid with meals, PT and OT evaluation.    2. Paroxysmal atrial fibrillation.  Metoprolol for rate control and anticoagulation with warfarin INR 2,2. Discontinue enoxaparin     3. CKD stage 3b, hypokalemia Continue hypervolemic, renal function with serum cr at 1,29 with K at 3,2 and serum bicarbonate at 36,  K correction with Kcl 40 meq x2 today and follow up renal function in am, avoid hypotension and nephrotoxic medications.    4. Obesity class 2. BMI is 36.0    5. Dyslipidemia. Statin therapy.     6. Gout. No signs of acute flare, continue with allopurinol   Patient continue to be at high risk for worsening heart failure   Status is: Inpatient  Remains inpatient appropriate because:IV treatments appropriate due to intensity of illness or inability to take PO  Dispo: The patient is from: Home              Anticipated d/c is to: Home  Patient currently is not medically stable to d/c.   Difficult to place patient No   DVT prophylaxis: Warfarin   Code Status:    full  Family Communication:    No family at the bedside      Subjective: Patient with improvement in dyspnea and edema but not yet back to baseline, no nausea or vomiting, no chest pain.   Objective: Vitals:    05/11/21 0822 05/11/21 2027 05/12/21 0018 05/12/21 0430  BP: (!) 141/78 104/65 137/79 (!) 150/94  Pulse: (!) 56 (!) 40 (!) 59 67  Resp:  20 20 18   Temp: 97.7 F (36.5 C) 97.6 F (36.4 C) 98.2 F (36.8 C) (!) 97.5 F (36.4 C)  TempSrc:  Oral Oral Oral  SpO2: 100% 98% 94% 93%  Weight:   81.8 kg   Height:        Intake/Output Summary (Last 24 hours) at 05/12/2021 0836 Last data filed at 05/12/2021 0452 Gross per 24 hour  Intake 360 ml  Output 1900 ml  Net -1540 ml   Filed Weights   05/10/21 1935 05/11/21 0229 05/12/21 0018  Weight: 83.9 kg 83.8 kg 81.8 kg    Examination:   General: deconditioned  Neurology: Awake and alert, non focal  E ENT: Mild pallor, no icterus, oral mucosa moist Cardiovascular: No JVD. S1-S2 present, rhythmic, no gallops, rubs, or murmurs. ++ bilateral lower extremity edema. Pulmonary: positive breath sounds bilaterally, with no wheezing, or rhonchi, positive bilateral rales. Gastrointestinal. Abdomen soft and non tender Skin. No rashes Musculoskeletal: no joint deformities     Data Reviewed: I have personally reviewed following labs and imaging studies  CBC: Recent Labs  Lab 05/10/21 1949 05/10/21 2023 05/11/21 0313 05/12/21 0402  WBC 6.6  --  8.1 5.6  NEUTROABS 3.6  --   --   --   HGB 14.4 15.0 12.6 13.0  HCT 44.4 44.0 39.6 40.9  MCV 100.7*  --  102.1* 101.0*  PLT 156  --  147* 138*   Basic Metabolic Panel: Recent Labs  Lab 05/10/21 1949 05/10/21 2023 05/11/21 0313 05/12/21 0402  NA 144 145 141 144  K 3.4* 3.4* 3.4* 3.2*  CL 99  --  101 97*  CO2 34*  --  31 36*  GLUCOSE 105*  --  97 85  BUN 24*  --  22 20  CREATININE 1.38*  --  1.32* 1.29*  CALCIUM 9.3  --  8.9 9.2   GFR: Estimated Creatinine Clearance: 33.5 mL/min (A) (by C-G formula based on SCr of 1.29 mg/dL (H)). Liver Function Tests: Recent Labs  Lab 05/10/21 1949  AST 29  ALT 20  ALKPHOS 58  BILITOT 0.6  PROT 8.0  ALBUMIN 4.2   No results for input(s):  LIPASE, AMYLASE in the last 168 hours. No results for input(s): AMMONIA in the last 168 hours. Coagulation Profile: Recent Labs  Lab 05/10/21 1949 05/11/21 0313 05/12/21 0402  INR 1.6* 1.6* 2.2*   Cardiac Enzymes: No results for input(s): CKTOTAL, CKMB, CKMBINDEX, TROPONINI in the last 168 hours. BNP (last 3 results) No results for input(s): PROBNP in the last 8760 hours. HbA1C: No results for input(s): HGBA1C in the last 72 hours. CBG: No results for input(s): GLUCAP in the last 168 hours. Lipid Profile: No results for input(s): CHOL, HDL, LDLCALC, TRIG, CHOLHDL, LDLDIRECT in the last 72 hours. Thyroid Function Tests: No results for input(s): TSH, T4TOTAL, FREET4, T3FREE, THYROIDAB in the last 72 hours. Anemia Panel: No results for  input(s): VITAMINB12, FOLATE, FERRITIN, TIBC, IRON, RETICCTPCT in the last 72 hours.    Radiology Studies: I have reviewed all of the imaging during this hospital visit personally     Scheduled Meds:  allopurinol  50 mg Oral Daily   amLODipine  10 mg Oral Daily   aspirin EC  81 mg Oral Daily   calcium carbonate  625 mg Oral Q supper   cholecalciferol   Oral Daily   furosemide  60 mg Intravenous TID   metoprolol succinate  100 mg Oral QHS   rosuvastatin  20 mg Oral QHS   sacubitril-valsartan  1 tablet Oral BID   sodium chloride flush  3 mL Intravenous Q12H   warfarin  1.5 mg Oral Once   Warfarin - Pharmacist Dosing Inpatient   Does not apply q1600   Continuous Infusions:  sodium chloride       LOS: 1 day        [place] Annett Gula, MD

## 2021-05-12 NOTE — Progress Notes (Signed)
PT Cancellation Note  Patient Details Name: [name] MRN: [id number] DOB: [birth year]   Cancelled Treatment:    Reason Eval/Treat Not Completed: Patient declined, no reason specified. Pt's nurse tech present upon PT arrival and reporting she had just assisted pt back to bed. Pt appeared to be and reported that she was very fatigued and requesting to rest at this time. Additionally, pt worked with OT earlier and stated that she walked in the room some and felt good. PT will continue to follow-up with pt acutely as available and appropriate.    Alessandra Bevels Halie Gass 05/12/2021, 2:17 PM

## 2021-05-13 ENCOUNTER — Other Ambulatory Visit (HOSPITAL_COMMUNITY): Payer: Self-pay

## 2021-05-13 DIAGNOSIS — I130 Hypertensive heart and chronic kidney disease with heart failure and stage 1 through stage 4 chronic kidney disease, or unspecified chronic kidney disease: Secondary | ICD-10-CM | POA: Diagnosis not present

## 2021-05-13 DIAGNOSIS — R0602 Shortness of breath: Secondary | ICD-10-CM | POA: Diagnosis not present

## 2021-05-13 DIAGNOSIS — N1832 Chronic kidney disease, stage 3b: Secondary | ICD-10-CM | POA: Diagnosis not present

## 2021-05-13 DIAGNOSIS — I5033 Acute on chronic diastolic (congestive) heart failure: Secondary | ICD-10-CM | POA: Diagnosis not present

## 2021-05-13 DIAGNOSIS — I482 Chronic atrial fibrillation, unspecified: Secondary | ICD-10-CM | POA: Diagnosis not present

## 2021-05-13 DIAGNOSIS — R9431 Abnormal electrocardiogram [ECG] [EKG]: Secondary | ICD-10-CM | POA: Diagnosis not present

## 2021-05-13 LAB — BASIC METABOLIC PANEL
Anion gap: 12 (ref 5–15)
BUN: 24 mg/dL — ABNORMAL HIGH (ref 8–23)
CO2: 35 mmol/L — ABNORMAL HIGH (ref 22–32)
Calcium: 9.5 mg/dL (ref 8.9–10.3)
Chloride: 95 mmol/L — ABNORMAL LOW (ref 98–111)
Creatinine, Ser: 1.55 mg/dL — ABNORMAL HIGH (ref 0.44–1.00)
GFR, Estimated: 34 mL/min — ABNORMAL LOW (ref >60)
Glucose, Bld: 87 mg/dL (ref 70–99)
Potassium: 3.9 mmol/L (ref 3.5–5.1)
Sodium: 142 mmol/L (ref 135–145)

## 2021-05-13 LAB — PROTIME-INR
INR: 2 — ABNORMAL HIGH (ref 0.8–1.2)
Prothrombin Time: 22.4 seconds — ABNORMAL HIGH (ref 11.4–15.2)

## 2021-05-13 MED ORDER — WARFARIN SODIUM 3 MG PO TABS
3.0000 mg | ORAL_TABLET | Freq: Once | ORAL | Status: AC
  Administered 2021-05-13: 3 mg via ORAL
  Filled 2021-05-13: qty 1

## 2021-05-13 MED ORDER — FUROSEMIDE 10 MG/ML IJ SOLN: 60.0000 mg | Freq: Two times a day (BID) | INTRAMUSCULAR | Status: DC

## 2021-05-13 NOTE — Progress Notes (Signed)
ANTICOAGULATION CONSULT NOTE  Pharmacy Consult for warfarin Indication: atrial fibrillation  Allergies  Allergen Reactions   Adhesive [Tape] Itching, Rash and Other (See Comments)    Redness and skin breaks down   Latex Rash    Patient Measurements: Height: 5' (152.4 cm) Weight: 81.3 kg (179 lb 3.7 oz) IBW/kg (Calculated) : 45.5   Vital Signs: Temp: 97.4 F (36.3 C) (08/15 0402) Temp Source: Oral (08/15 0402) BP: 129/97 (08/15 0402) Pulse Rate: 78 (08/15 0402)  Labs: Recent Labs    05/10/21 1949 05/10/21 2023 05/10/21 2230 05/11/21 0313 05/11/21 0706 05/12/21 0402 05/13/21 0340  HGB 14.4 15.0  --  12.6  --  13.0  --   HCT 44.4 44.0  --  39.6  --  40.9  --   PLT 156  --   --  147*  --  138*  --   LABPROT 19.1*  --   --  19.2*  --  24.0* 22.4*  INR 1.6*  --   --  1.6*  --  2.2* 2.0*  CREATININE 1.38*  --   --  1.32*  --  1.29* 1.55*  TROPONINIHS 34*  --  35* 33* 35*  --   --      Estimated Creatinine Clearance: 27.8 mL/min (A) (by C-G formula based on SCr of 1.55 mg/dL (H)).    Assessment: 80 y.o. female with history of atrial fibrillation on PTA warfarin (home regimen 1.5 mg Tu/Sa/Su and 3 mg M/W/Th/F). Lower INR goal 1.8-2.2 due to history of ICH. Pharmacy consulted to dose warfarin.  INR is therapeutic at 2 after bridging with enoxaparin for two days. No CBC today. No bleeding noted.  Goal of Therapy:  INR 1.8-2.2   Plan:  Warfarin 3 mg PO today Daily INR, CBC Monitor for s/sx of bleeding  Thank you for involving pharmacy in this patient's care.  Loura Back, PharmD, BCPS Clinical Pharmacist Clinical phone for 05/13/2021 until 3p is [phone] 05/13/2021 8:41 AM  **Pharmacist phone directory can be found on [email or web address] listed under [facility]**

## 2021-05-13 NOTE — Progress Notes (Signed)
PT Cancellation Note  Patient Details Name: [name] MRN: [id number] DOB: 12-29-1940   Cancelled Treatment:    Reason Eval/Treat Not Completed: Other (comment).  Pt is asleep, quite lethargic and cannot awaken.  Retry as time and pt allow.   Ivar Drape 05/13/2021, 9:50 AM  Samul Dada, PT MS Acute Rehab Dept. Number: [facility] [id number] and [facility] [phone]

## 2021-05-13 NOTE — Progress Notes (Signed)
PROGRESS NOTE    [name]  ZOX:[id number] DOB: August 23, 1941 DOA: 05/10/2021 PCP: Silverio Decamp, MD    Brief Narrative:  [name] was admitted to the hospital with the working diagnosis of decompensated heart failure.    80 year old female past medical history for hypertension, dyslipidemia, heart failure, atrial fibrillation, and CVA who presents with dyspnea.  Reported 3-4 days of worsening dyspnea on exertion, positive PND/orthopnea along with worsening lower extremity edema on her initial physical examination blood pressure 172/116, heart rate 75, respiratory rate 27, temperature 97.4, oxygen saturation 92% on supplemental oxygen, her lungs had no wheezing, heart S1-S2, irregularly irregular, abdomen soft, nontender, positive pitting bilateral extremity edema.   Sodium 144, potassium 3.4, chloride 99, bicarb 34, glucose 105, BUN 24, creatinine 1.38, BNP 845, high sensitive troponin 34-35-33-35, white count 6.6, hemoglobin 14.4, hematocrit 44.4, platelets 156.  INR 1.6. SARS COVID-19 negative.   Chest radiograph with bilateral hilar vascular congestion.   EKG 67 bpm, left axis deviation, left anterior fascicular block, QTC 510, sinus rhythm with PVCs, Q-wave V1-V3, no significant ST segment or T wave changes   Patient placed on IV furosemide for diuresis.    Apparently poor dietary compliance at home.  Old records personally reviewed, follows with cardiology at [facility], visit in 06/22 increase furosemide to 80 mg in am, for volume overload.    Assessment & Plan:   Principal Problem:   Acute on chronic diastolic CHF (congestive heart failure) (HCC) Active Problems:   Essential hypertension   CKD (chronic kidney disease), stage III (HCC)   Chronic atrial fibrillation (HCC)   Chronic anticoagulation- Coumadin   Prolonged QT interval   Subtherapeutic anticoagulation     Acute on chronic diastolic heart failure decompensation./ RV failure with pulmonary HTN. (Acute  on chronic core pulmonale) SP bioprosthetic aortic valve. Prolonged QTc  Continue to improve volume status, urine output is 1350 ml over last 24 hrs.   Continue with metoprolol and entresto for heart failure and amlodipine for blood pressure control.  Patient is out of bed to chair. Uses home 02.    2015 LV EF 50 to 55%, mild dilatation of RV with PA peak pressure 40 mmHg. Follow up echocardiogram with EF 45 to 50% on the LV, with global hypokinesis, severe reduction in RV systolic pressure, severe enlarged RV cavity, RSVP 39,54 mmHg. Severe dilatation biatrial. TR mild to moderate. Aortic valve prosthesis with adequate function.     Patient will need  home health services   2. Paroxysmal atrial fibrillation.  Rate control with metoprolol and anticoagulation with warfarin Follow with pharmacy protocol. INR today is 2.0    3. CKD stage 3b, hypokalemia  Worsening renal function with serum cr at 1,55 with at 3,9 and serum bicarbonate at 35.  Hold on furosemide today and follow up renal function in am, avoid hypotension and nephrotoxic medications.    4. Obesity class 2. BMI is 36.0    5. Dyslipidemia. Continue with satin therapy.     6. Gout. No acute flare, continue with allopurinol  Patient continue to be at high risk for worsening renal function   Status is: Inpatient  Remains inpatient appropriate because:Inpatient level of care appropriate due to severity of illness  Dispo: The patient is from: Home              Anticipated d/c is to: Home possible dc home in am if improvement in renal function.  Patient currently is not medically stable to d/c.   Difficult to place patient No   DVT prophylaxis: Apixaban   Code Status:    full  Family Communication:   I spoke over the phone with the patient's daughter [name] about patient's  condition, plan of care, prognosis and all questions were addressed.    Subjective: Patient is feeling better, dyspnea and lower extremity  edema have improved, she is out of bed to chair this am.   Objective: Vitals:   05/12/21 0018 05/12/21 0430 05/12/21 2014 05/13/21 0402  BP: 137/79 (!) 150/94 126/69 (!) 129/97  Pulse: (!) 59 67 (!) 48 78  Resp: 20 18 18 20   Temp: 98.2 F (36.8 C) (!) 97.5 F (36.4 C) (!) 97.3 F (36.3 C) (!) 97.4 F (36.3 C)  TempSrc: Oral Oral Oral Oral  SpO2: 94% 93% 100% 99%  Weight: 81.8 kg   81.3 kg  Height:        Intake/Output Summary (Last 24 hours) at 05/13/2021 1030 Last data filed at 05/13/2021 0900 [name] per 24 hour  Intake 600 ml  Output 1700 ml  Net -1100 ml   Filed Weights   05/11/21 0229 05/12/21 0018 05/13/21 0402  Weight: 83.8 kg 81.8 kg 81.3 kg    Examination:   General: Not in pain or dyspnea  Neurology: Awake and alert, non focal  E ENT: no pallor, no icterus, oral mucosa moist Cardiovascular: No JVD. S1-S2 present, , no gallops, rubs, positive systolic murmur a the right 2nd space. trace lower extremity edema. Varicose veins on the right Pulmonary: positive breath sounds bilaterally, with no wheezing, or rhonchi mild rales at bases. Gastrointestinal. Abdomen soft and non tender Skin. No rashes Musculoskeletal: hypertrophic knees.       Data Reviewed: I have personally reviewed following labs and imaging studies  CBC: Recent Labs  Lab 05/10/21 1949 05/10/21 2023 05/11/21 0313 05/12/21 0402  WBC 6.6  --  8.1 5.6  NEUTROABS 3.6  --   --   --   HGB 14.4 15.0 12.6 13.0  HCT 44.4 44.0 39.6 40.9  MCV 100.7*  --  102.1* 101.0*  PLT 156  --  147* 138*   Basic Metabolic Panel: Recent Labs  Lab 05/10/21 1949 05/10/21 2023 05/11/21 0313 05/12/21 0402 05/13/21 0340  NA 144 145 141 144 142  K 3.4* 3.4* 3.4* 3.2* 3.9  CL 99  --  101 97* 95*  CO2 34*  --  31 36* 35*  GLUCOSE 105*  --  97 85 87  BUN 24*  --  22 20 24*  CREATININE 1.38*  --  1.32* 1.29* 1.55*  CALCIUM 9.3  --  8.9 9.2 9.5   GFR: Estimated Creatinine Clearance: 27.8 mL/min (A) (by C-G  formula based on SCr of 1.55 mg/dL (H)). Liver Function Tests: Recent Labs  Lab 05/10/21 1949  AST 29  ALT 20  ALKPHOS 58  BILITOT 0.6  PROT 8.0  ALBUMIN 4.2   No results for input(s): LIPASE, AMYLASE in the last 168 hours. No results for input(s): AMMONIA in the last 168 hours. Coagulation Profile: Recent Labs  Lab 05/10/21 1949 05/11/21 0313 05/12/21 0402 05/13/21 0340  INR 1.6* 1.6* 2.2* 2.0*   Cardiac Enzymes: No results for input(s): CKTOTAL, CKMB, CKMBINDEX, TROPONINI in the last 168 hours. BNP (last 3 results) No results for input(s): PROBNP in the last 8760 hours. HbA1C: No results for input(s): HGBA1C in the last 72 hours. CBG: No results for  input(s): GLUCAP in the last 168 hours. Lipid Profile: No results for input(s): CHOL, HDL, LDLCALC, TRIG, CHOLHDL, LDLDIRECT in the last 72 hours. Thyroid Function Tests: No results for input(s): TSH, T4TOTAL, FREET4, T3FREE, THYROIDAB in the last 72 hours. Anemia Panel: No results for input(s): VITAMINB12, FOLATE, FERRITIN, TIBC, IRON, RETICCTPCT in the last 72 hours.    Radiology Studies: I have reviewed all of the imaging during this hospital visit personally     Scheduled Meds:  allopurinol  50 mg Oral Daily   amLODipine  10 mg Oral Daily   aspirin EC  81 mg Oral Daily   calcium carbonate  625 mg Oral Q supper   cholecalciferol   Oral Daily   furosemide  60 mg Intravenous TID   metoprolol succinate  100 mg Oral QHS   rosuvastatin  20 mg Oral QHS   sacubitril-valsartan  1 tablet Oral BID   sodium chloride flush  3 mL Intravenous Q12H   warfarin  3 mg Oral ONCE-1600   Warfarin - Pharmacist Dosing Inpatient   Does not apply q1600   Continuous Infusions:  sodium chloride       LOS: 2 days        Sitara Cashwell Annett Gula, MD

## 2021-05-13 NOTE — TOC Benefit Eligibility Note (Signed)
Patient Product/process development scientist completed.    The patient is currently admitted and upon discharge could be taking Entresto 24-26 mg.  The current 30 day co-pay is, $37.00.   The patient is insured through [employer] Medicare Part D     Roland Earl, CPhT Pharmacy Patient Advocate Specialist [facility] Antimicrobial Stewardship Team Direct Number: [phone]  Fax: [phone]

## 2021-05-13 NOTE — Progress Notes (Signed)
Physical Therapy Evaluation Patient Details Name: [name] MRN: [id number] DOB: 1941-02-12 Today's Date: 05/13/2021   History of Present Illness  80 year old female  with dx of decompensated heart failure. PMHx: HTN, DLD, heart failure, atrial fibrillation, and CVA who presents with dyspnea.  Clinical Impression  Pt was seen for mobility with RW and note her ability to take her time and cover gait in a fairly stable way on level surfaces.  Follow along with her to increase endurance, will attempt steps as she tolerates and work on final disposition home with family.  Recommend HHPT and follow her for consideration of family education for home.    Follow Up Recommendations Home health PT    Equipment Recommendations  None recommended by PT    Recommendations for Other Services       Precautions / Restrictions Precautions Precautions: Fall Precaution Comments: O2 on 2L Restrictions Weight Bearing Restrictions: No      Mobility  Bed Mobility Overal bed mobility: Modified Independent                  Transfers Overall transfer level: Needs assistance Equipment used: 1 person hand held assist Transfers: Sit to/from Stand;Stand Pivot Transfers Sit to Stand: Min guard Stand pivot transfers: Min guard          Ambulation/Gait Ambulation/Gait assistance: Min guard Gait Distance (Feet): 60 Feet Assistive device: Rolling walker (2 wheeled);1 person hand held assist Gait Pattern/deviations: Step-through pattern;Decreased stride length;Narrow base of support Gait velocity: reduced Gait velocity interpretation: <1.31 ft/sec, indicative of household Insurance account manager Rankin (Stroke Patients Only)       Balance Overall balance assessment: Needs assistance Sitting-balance support: Feet supported Sitting balance-Leahy Scale: Good     Standing balance support: Single extremity supported Standing  balance-Leahy Scale: Poor Standing balance comment: fair on RW                             Pertinent Vitals/Pain Pain Assessment: No/denies pain    Home Living Family/patient expects to be discharged to:: Private residence Living Arrangements: Children Available Help at Discharge: Family;Available 24 hours/day Type of Home: House Home Access: Stairs to enter Entrance Stairs-Rails: Can reach both Entrance Stairs-Number of Steps: 2 Home Layout: Able to live on main level with bedroom/bathroom Home Equipment: Walker - 2 wheels;Bedside commode;Shower seat;Wheelchair - Fish farm manager Comments: Pt will discharge to 1/4 children's home.    Prior Function Level of Independence: Independent         Comments: self care is independent but has also help with her children     Hand Dominance   Dominant Hand: Right    Extremity/Trunk Assessment   Upper Extremity Assessment Upper Extremity Assessment: Defer to OT evaluation    Lower Extremity Assessment Lower Extremity Assessment: Generalized weakness    Cervical / Trunk Assessment Cervical / Trunk Assessment: Normal  Communication   Communication: HOH  Cognition Arousal/Alertness: Awake/alert Behavior During Therapy: WFL for tasks assessed/performed Overall Cognitive Status: Within Functional Limits for tasks assessed                                        General Comments General comments (skin integrity, edema, etc.): Pt was seen for mobility on RW  with good tolerance for gait and balanc requiring min guard, motivated and appropriate for family assist    Exercises     Assessment/Plan    PT Assessment Patient needs continued PT services  PT Problem List Decreased strength;Decreased activity tolerance;Cardiopulmonary status limiting activity       PT Treatment Interventions DME instruction;Gait training;Stair training;Therapeutic activities;Functional mobility  training;Therapeutic exercise;Balance training;Neuromuscular re-education;Patient/family education    PT Goals (Current goals can be found in the Care Plan section)  Acute Rehab PT Goals Patient Stated Goal: to go home PT Goal Formulation: With patient Time For Goal Achievement: 05/20/21 Potential to Achieve Goals: Good    Frequency Min 3X/week   Barriers to discharge Decreased caregiver support some children are working    Co-evaluation               AM-PAC PT "6 Clicks" Mobility  Outcome Measure Help needed turning from your back to your side while in a flat bed without using bedrails?: A Little Help needed moving from lying on your back to sitting on the side of a flat bed without using bedrails?: A Little Help needed moving to and from a bed to a chair (including a wheelchair)?: A Little Help needed standing up from a chair using your arms (e.g., wheelchair or bedside chair)?: A Little Help needed to walk in hospital room?: A Little Help needed climbing 3-5 steps with a railing? : A Little 6 Click Score: 18    End of Session Equipment Utilized During Treatment: Gait belt;Oxygen Activity Tolerance: Patient tolerated treatment well Patient left: in bed;with bed alarm set Nurse Communication: Mobility status PT Visit Diagnosis: Unsteadiness on feet (R26.81);Other symptoms and signs involving the nervous system (R29.898)    Time: 1610-9604 PT Time Calculation (min) (ACUTE ONLY): 30 min   Charges:   PT Evaluation $PT Eval Moderate Complexity: 1 Mod PT Treatments $Gait Training: 8-22 mins       Ivar Drape 05/13/2021, 4:06 PM  Samul Dada, PT MS Acute Rehab Dept. Number: [facility] [id number] and [facility] [phone]

## 2021-05-14 ENCOUNTER — Other Ambulatory Visit (HOSPITAL_COMMUNITY): Payer: Self-pay

## 2021-05-14 DIAGNOSIS — N1832 Chronic kidney disease, stage 3b: Secondary | ICD-10-CM | POA: Diagnosis not present

## 2021-05-14 DIAGNOSIS — I10 Essential (primary) hypertension: Secondary | ICD-10-CM | POA: Diagnosis not present

## 2021-05-14 DIAGNOSIS — J9621 Acute and chronic respiratory failure with hypoxia: Secondary | ICD-10-CM | POA: Diagnosis not present

## 2021-05-14 DIAGNOSIS — I5043 Acute on chronic combined systolic (congestive) and diastolic (congestive) heart failure: Secondary | ICD-10-CM | POA: Diagnosis not present

## 2021-05-14 DIAGNOSIS — I5033 Acute on chronic diastolic (congestive) heart failure: Secondary | ICD-10-CM | POA: Diagnosis not present

## 2021-05-14 DIAGNOSIS — R0602 Shortness of breath: Secondary | ICD-10-CM | POA: Diagnosis not present

## 2021-05-14 DIAGNOSIS — I482 Chronic atrial fibrillation, unspecified: Secondary | ICD-10-CM | POA: Diagnosis not present

## 2021-05-14 DIAGNOSIS — I130 Hypertensive heart and chronic kidney disease with heart failure and stage 1 through stage 4 chronic kidney disease, or unspecified chronic kidney disease: Secondary | ICD-10-CM | POA: Diagnosis not present

## 2021-05-14 DIAGNOSIS — Z20822 Contact with and (suspected) exposure to covid-19: Secondary | ICD-10-CM | POA: Diagnosis not present

## 2021-05-14 LAB — BASIC METABOLIC PANEL
Anion gap: 10 (ref 5–15)
BUN: 28 mg/dL — ABNORMAL HIGH (ref 8–23)
CO2: 34 mmol/L — ABNORMAL HIGH (ref 22–32)
Calcium: 9.8 mg/dL (ref 8.9–10.3)
Chloride: 96 mmol/L — ABNORMAL LOW (ref 98–111)
Creatinine, Ser: 1.76 mg/dL — ABNORMAL HIGH (ref 0.44–1.00)
GFR, Estimated: 29 mL/min — ABNORMAL LOW (ref >60)
Glucose, Bld: 86 mg/dL (ref 70–99)
Potassium: 4.4 mmol/L (ref 3.5–5.1)
Sodium: 140 mmol/L (ref 135–145)

## 2021-05-14 LAB — PROTIME-INR
INR: 2 — ABNORMAL HIGH (ref 0.8–1.2)
Prothrombin Time: 22.3 seconds — ABNORMAL HIGH (ref 11.4–15.2)

## 2021-05-14 MED ORDER — WARFARIN SODIUM 1 MG PO TABS
1.5000 mg | ORAL_TABLET | ORAL | Status: DC
  Administered 2021-05-14: 1.5 mg via ORAL
  Filled 2021-05-14: qty 1

## 2021-05-14 MED ORDER — WARFARIN SODIUM 3 MG PO TABS
3.0000 mg | ORAL_TABLET | ORAL | Status: DC
  Administered 2021-05-15: 3 mg via ORAL
  Filled 2021-05-14: qty 1

## 2021-05-14 MED ORDER — ROSUVASTATIN CALCIUM 20 MG PO TABS
20.0000 mg | ORAL_TABLET | Freq: Every day | ORAL | Status: DC
  Administered 2021-05-14 – 2021-05-15 (×2): 20 mg via ORAL
  Filled 2021-05-14 (×2): qty 1

## 2021-05-14 MED ORDER — FUROSEMIDE 40 MG PO TABS: 60.0000 mg | ORAL_TABLET | Freq: Two times a day (BID) | ORAL | Status: DC

## 2021-05-14 NOTE — Plan of Care (Signed)
  Problem: Elimination: Goal: Will not experience complications related to urinary retention Outcome: Progressing   Problem: Safety: Goal: Ability to remain free from injury will improve Outcome: Progressing   Problem: Activity: Goal: Capacity to carry out activities will improve Outcome: Progressing   

## 2021-05-14 NOTE — Progress Notes (Addendum)
PROGRESS NOTE    [name]  WCB:[id number] DOB: [birth year] DOA: 05/10/2021 PCP: Silverio Decamp, MD    Brief Narrative:  [name] was admitted to the hospital with the working diagnosis of decompensated heart failure complicated with AKI on CKD stage 8b   80 year old female past medical history for hypertension, dyslipidemia, heart failure, atrial fibrillation, and CVA who presents with dyspnea.  Reported 3-4 days of worsening dyspnea on exertion, positive PND/orthopnea along with worsening lower extremity edema on her initial physical examination blood pressure 172/116, heart rate 75, respiratory rate 27, temperature 97.4, oxygen saturation 92% on supplemental oxygen, her lungs had no wheezing, heart S1-S2, irregularly irregular, abdomen soft, nontender, positive pitting bilateral extremity edema.   Sodium 144, potassium 3.4, chloride 99, bicarb 34, glucose 105, BUN 24, creatinine 1.38, BNP 845, high sensitive troponin 34-35-33-35, white count 6.6, hemoglobin 14.4, hematocrit 44.4, platelets 156.  INR 1.6. SARS COVID-19 negative.   Chest radiograph with bilateral hilar vascular congestion.   EKG 67 bpm, left axis deviation, left anterior fascicular block, QTC 510, sinus rhythm with PVCs, Q-wave V1-V3, no significant ST segment or T wave changes   Patient placed on IV furosemide for diuresis.    Apparently poor dietary compliance at home.  Old records personally reviewed, follows with cardiology at [facility], visit in 06/22 increase furosemide to 80 mg daily, for volume overload.   Her volume has been improved, but developed worsening renal function.  Patient will need home health services.   Assessment & Plan:   Principal Problem:   Acute on chronic diastolic CHF (congestive heart failure) (HCC) Active Problems:   Essential hypertension   CKD (chronic kidney disease), stage III (HCC)   Chronic atrial fibrillation (HCC)   Chronic anticoagulation- Coumadin   Prolonged  QT interval   Subtherapeutic anticoagulation   Acute on chronic diastolic heart failure decompensation./ RV failure with pulmonary HTN. (Acute on chronic core pulmonale) SP bioprosthetic aortic valve. Prolonged QTc  2015 LV EF 50 to 55%, mild dilatation of RV with PA peak pressure 40 mmHg. Follow up echocardiogram with EF 45 to 50% on the LV, with global hypokinesis, severe reduction in RV systolic function, severe enlarged RV cavity, RSVP 39,54 mmHg. Severe dilatation biatrial. TR mild to moderate. Aortic valve prosthesis with adequate function.    Urine out put documented over last 24 hrs is 250 ml. This am patient with fatigue and continue to have dyspnea.  Weight is 80,4 kg from 81.3 kg.    Blood pressure 114 to 140 mmHg.  Patient has home 02 per her daughter report.   Will plan to resume oral furosemide tomorrow, 60 mg po bid.  Dicontinue Entresto patient with no reduction in systolic function.    2. Paroxysmal atrial fibrillation.  Continue with metoprolol for rate control and anticoagulation with warfarin  INR today is 2.0  Follow up EKG today patient in atrial fibrillation rhythm with positive PVCs.    3. AKI on CKD stage 3b, hypokalemia  Renal function with serum cr at 1.76, continue to be congested on physical examination, K is 4,4 and serum bicarbonate is 34. Continue diuresis with furosemide and follow up renal function in am.  Discontinue Entresto.   4. Obesity class 2. Calculated BMI is 36.0    5. Dyslipidemia. on statin therapy.    6. Gout. No acute flare, On allopurinol   Patient continue to be at high risk for worsening heart failure   Status is: Inpatient  Remains inpatient appropriate  because:IV treatments appropriate due to intensity of illness or inability to take PO  Dispo: The patient is from: Home              Anticipated d/c is to: Home              Patient currently is not medically stable to d/c.   Difficult to place patient No   DVT  prophylaxis: Warfarin   Code Status:    full  Family Communication:    I spoke over the phone with the patient's daughter about patient's  condition, plan of care, prognosis and all questions were addressed.     Subjective: Patient continue to be very weak and deconditioned, no nausea or vomiting, persistent dyspnea.,   Objective: Vitals:   05/13/21 1653 05/13/21 1955 05/14/21 0435 05/14/21 0821  BP: 125/87 (!) 149/112 138/83 114/64  Pulse: 94 74 63 (!) 58  Resp:  15 18 18   Temp: 97.9 F (36.6 C) 98.2 F (36.8 C) 97.6 F (36.4 C)   TempSrc: Oral Oral Oral   SpO2: 100% 99% 98%   Weight:   80.5 kg   Height:        Intake/Output Summary (Last 24 hours) at 05/14/2021 1057 Last data filed at 05/14/2021 0849 Gross per 24 hour  Intake 663 ml  Output 250 ml  Net 413 ml   Filed Weights   05/12/21 0018 05/13/21 0402 05/14/21 0435  Weight: 81.8 kg 81.3 kg 80.5 kg    Examination:   General: Not in pain or dyspnea  Neurology: Awake and alert, non focal  E ENT: mid pallor, no icterus, oral mucosa moist Cardiovascular: No JVD. S1-S2 present, rhythmic, no gallops, rubs, or murmurs. +/++ non pitting bilateral lower extremity edema. Pulmonary:  positive breath sounds bilaterally, with no wheezing, or rhonchi , but scattered rales. Gastrointestinal. Abdomen soft and non tender Skin. No rashes Musculoskeletal: no joint deformities     Data Reviewed: I have personally reviewed following labs and imaging studies  CBC: Recent Labs  Lab 05/10/21 1949 05/10/21 2023 05/11/21 0313 05/12/21 0402  WBC 6.6  --  8.1 5.6  NEUTROABS 3.6  --   --   --   HGB 14.4 15.0 12.6 13.0  HCT 44.4 44.0 39.6 40.9  MCV 100.7*  --  102.1* 101.0*  PLT 156  --  147* 138*   Basic Metabolic Panel: Recent Labs  Lab 05/10/21 1949 05/10/21 2023 05/11/21 0313 05/12/21 0402 05/13/21 0340 05/14/21 0348  NA 144 145 141 144 142 140  K 3.4* 3.4* 3.4* 3.2* 3.9 4.4  CL 99  --  101 97* 95* 96*  CO2 34*   --  31 36* 35* 34*  GLUCOSE 105*  --  97 85 87 86  BUN 24*  --  22 20 24* 28*  CREATININE 1.38*  --  1.32* 1.29* 1.55* 1.76*  CALCIUM 9.3  --  8.9 9.2 9.5 9.8   GFR: Estimated Creatinine Clearance: 24.3 mL/min (A) (by C-G formula based on SCr of 1.76 mg/dL (H)). Liver Function Tests: Recent Labs  Lab 05/10/21 1949  AST 29  ALT 20  ALKPHOS 58  BILITOT 0.6  PROT 8.0  ALBUMIN 4.2   No results for input(s): LIPASE, AMYLASE in the last 168 hours. No results for input(s): AMMONIA in the last 168 hours. Coagulation Profile: Recent Labs  Lab 05/10/21 1949 05/11/21 0313 05/12/21 0402 05/13/21 0340 05/14/21 0348  INR 1.6* 1.6* 2.2* 2.0* 2.0*   Cardiac Enzymes:  No results for input(s): CKTOTAL, CKMB, CKMBINDEX, TROPONINI in the last 168 hours. BNP (last 3 results) No results for input(s): PROBNP in the last 8760 hours. HbA1C: No results for input(s): HGBA1C in the last 72 hours. CBG: No results for input(s): GLUCAP in the last 168 hours. Lipid Profile: No results for input(s): CHOL, HDL, LDLCALC, TRIG, CHOLHDL, LDLDIRECT in the last 72 hours. Thyroid Function Tests: No results for input(s): TSH, T4TOTAL, FREET4, T3FREE, THYROIDAB in the last 72 hours. Anemia Panel: No results for input(s): VITAMINB12, FOLATE, FERRITIN, TIBC, IRON, RETICCTPCT in the last 72 hours.    Radiology Studies: I have reviewed all of the imaging during this hospital visit personally     Scheduled Meds:  allopurinol  50 mg Oral Daily   amLODipine  10 mg Oral Daily   aspirin EC  81 mg Oral Daily   calcium carbonate  625 mg Oral Q supper   cholecalciferol   Oral Daily   metoprolol succinate  100 mg Oral QHS   rosuvastatin  20 mg Oral QHS   sacubitril-valsartan  1 tablet Oral BID   sodium chloride flush  3 mL Intravenous Q12H   warfarin  1.5 mg Oral Once per day on Sun Tue Sat   [START ON 05/15/2021] warfarin  3 mg Oral Once per day on Mon Wed Thu Fri   Warfarin - Pharmacist Dosing Inpatient    Does not apply q1600   Continuous Infusions:  sodium chloride       LOS: 3 days        Lajuanna Pompa Annett Gula, MD

## 2021-05-14 NOTE — Progress Notes (Signed)
Physical Therapy Treatment Patient Details Name: [name] MRN: [id number] DOB: [birth year] Today's Date: 05/14/2021    History of Present Illness Pt is a 80 y.o. female admitted 05/10/21 with SOB. CXR with bilateral hilar vascular congestion. Workup for decompensated HF, AKI. PMH includes HTN, CHF, afib, CVA, gout, obesity.   PT Comments    Pt progressing well with mobility. Today's session focused on ambulation with RW; pt demonstrates improved activity tolerance and stability. Educ re: activity recommendations, pursed lip breathing, activity pacing, pulse ox use, O2 needs. Will continue to follow to address established goals.  SpO2 on Room Air at Rest = 95% SpO2 on Room Air while Ambulating = 88-94%   Follow Up Recommendations  Home health PT;Supervision - Intermittent     Equipment Recommendations  None recommended by PT    Recommendations for Other Services       Precautions / Restrictions Precautions Precautions: Fall;Other (comment) Precaution Comments: Watch SpO2 (does not wear consistently at baseline) Restrictions Weight Bearing Restrictions: No    Mobility  Bed Mobility Overal bed mobility: Modified Independent                  Transfers Overall transfer level: Needs assistance Equipment used: None;Rolling walker (2 wheeled) Transfers: Sit to/from Stand Sit to Stand: Supervision         General transfer comment: Multiple sit<>stands from EOB and recliner with and without RW, supervision for safety  Ambulation/Gait Ambulation/Gait assistance: Supervision Gait Distance (Feet): 160 Feet (+ 160') Assistive device: Rolling walker (2 wheeled) Gait Pattern/deviations: Step-through pattern;Decreased stride length Gait velocity: Decreased Gait velocity interpretation: 1.31 - 2.62 ft/sec, indicative of limited community ambulator General Gait Details: Pt requesting use of RW for ambulation; slow, steady gait with RW and supervision for safety; no  overt instability or LOB noted; DOE 2-3/4, pt conversant and wearing mask   Stairs             Wheelchair Mobility    Modified Rankin (Stroke Patients Only)       Balance Overall balance assessment: Needs assistance Sitting-balance support: Feet supported Sitting balance-Leahy Scale: Good       Standing balance-Leahy Scale: Fair Standing balance comment: Can static stand and take steps without UE support; static and dynamic stability improved with RW                            Cognition Arousal/Alertness: Awake/alert Behavior During Therapy: WFL for tasks assessed/performed Overall Cognitive Status: Within Functional Limits for tasks assessed                                        Exercises      General Comments General comments (skin integrity, edema, etc.): SpO2 88-94% on RA with activity. Educ pt re: personal pulse ox use, current O2 needs (and SpO2 values), activity recommendations, discharge recommendations      Pertinent Vitals/Pain Pain Assessment: No/denies pain    Home Living                      Prior Function            PT Goals (current goals can now be found in the care plan section) Acute Rehab PT Goals Time For Goal Achievement: 05/27/21 Potential to Achieve Goals: Good Progress towards PT goals: Progressing toward goals  Frequency    Min 3X/week      PT Plan Current plan remains appropriate    Co-evaluation              AM-PAC PT "6 Clicks" Mobility   Outcome Measure  Help needed turning from your back to your side while in a flat bed without using bedrails?: None Help needed moving from lying on your back to sitting on the side of a flat bed without using bedrails?: None Help needed moving to and from a bed to a chair (including a wheelchair)?: A Little Help needed standing up from a chair using your arms (e.g., wheelchair or bedside chair)?: A Little Help needed to walk in  hospital room?: A Little Help needed climbing 3-5 steps with a railing? : A Little 6 Click Score: 20    End of Session Equipment Utilized During Treatment: Oxygen Activity Tolerance: Patient tolerated treatment well Patient left: in chair;with call bell/phone within reach;Other (comment) (with OT present for their session) Nurse Communication: Mobility status PT Visit Diagnosis: Unsteadiness on feet (R26.81);Other symptoms and signs involving the nervous system (R29.898)     Time: 0712-1975 PT Time Calculation (min) (ACUTE ONLY): 28 min  Charges:  $Gait Training: 8-22 mins $Therapeutic Exercise: 8-22 mins                     Ina Homes, PT, DPT Acute Rehabilitation Services  Pager [phone] Office [phone]  Malachy Chamber 05/14/2021, 2:24 PM

## 2021-05-14 NOTE — Progress Notes (Signed)
Occupational Therapy Treatment/Discharge Patient Details Name: [name] MRN: [id number] DOB: [birth year] Today's Date: 05/14/2021    History of present illness Pt is a 80 y.o. female admitted 05/10/21 with SOB. CXR with bilateral hilar vascular congestion. Workup for decompensated HF, AKI. PMH includes HTN, CHF, afib, CVA, gout, obesity.   OT comments  Pt seen after PT session with improvements in endurance. Pt able to demo in room mobility and ADLs without physical assist though noted to reach out for support without DME use. Collaborated and educated pt on energy conservation strategies with pt already implementing many strategies. Pt w/ good family support at DC and assist with all IADLs as needed. No further skilled OT services needed at this time.   SpO2 92% on RA with in-room activities.    Follow Up Recommendations  No OT follow up    Equipment Recommendations  None recommended by OT    Recommendations for Other Services      Precautions / Restrictions Precautions Precautions: Fall;Other (comment) Precaution Comments: Watch SpO2 (does not wear consistently at baseline) Restrictions Weight Bearing Restrictions: No       Mobility Bed Mobility Overal bed mobility: Modified Independent             General bed mobility comments: up in chair    Transfers Overall transfer level: Needs assistance Equipment used: None Transfers: Sit to/from Stand Sit to Stand: Supervision         General transfer comment: Multiple sit<>stands from EOB and recliner with and without RW, supervision for safety    Balance Overall balance assessment: Needs assistance Sitting-balance support: Feet supported Sitting balance-Leahy Scale: Good       Standing balance-Leahy Scale: Fair Standing balance comment: Can static stand and take steps without UE support; static and dynamic stability improved with Ue support                           ADL either performed or  assessed with clinical judgement   ADL Overall ADL's : At baseline                                       General ADL Comments: able to mobilize in room, stand at sink for brushing hair without LOB. Pt prefers handheld assist rather than AD use. holds to sturdy items in home. discussed energy conservation use as pt reports fatigue with LB dressing. Pt props LEs up on bed, has AE but does not typically use. Able to identify need for rest breaks, when to perform ADLs seated and has family assist with IADLs     Vision   Vision Assessment?: No apparent visual deficits   Perception     Praxis      Cognition Arousal/Alertness: Awake/alert Behavior During Therapy: WFL for tasks assessed/performed Overall Cognitive Status: Within Functional Limits for tasks assessed                                          Exercises     Shoulder Instructions       General Comments Spo2 92% on RA with in-room ADLs/mobility. Assisted pt with cellphone use to call family members. Provided written steps/tips to use to be able to independently call family members and increase socialization  Pertinent Vitals/ Pain       Pain Assessment: No/denies pain  Home Living                                          Prior Functioning/Environment              Frequency  Min 2X/week        Progress Toward Goals  OT Goals(current goals can now be found in the care plan section)  Progress towards OT goals: Progressing toward goals  Acute Rehab OT Goals Patient Stated Goal: to go home OT Goal Formulation: With patient Time For Goal Achievement: 05/26/21 Potential to Achieve Goals: Good ADL Goals Additional ADL Goal #1: Pt will state 3 energy conservation technique for OOB ADL tasks.  Plan Discharge plan remains appropriate    Co-evaluation                 AM-PAC OT "6 Clicks" Daily Activity     Outcome Measure   Help from another  person eating meals?: None Help from another person taking care of personal grooming?: None Help from another person toileting, which includes using toliet, bedpan, or urinal?: A Little Help from another person bathing (including washing, rinsing, drying)?: A Little Help from another person to put on and taking off regular upper body clothing?: None Help from another person to put on and taking off regular lower body clothing?: A Little 6 Click Score: 21    End of Session    OT Visit Diagnosis: Unsteadiness on feet (R26.81);Muscle weakness (generalized) (M62.81)   Activity Tolerance Patient tolerated treatment well   Patient Left in chair;with call bell/phone within reach   Nurse Communication          Time: 1840-3754 OT Time Calculation (min): 25 min  Charges: OT General Charges $OT Visit: 1 Visit OT Treatments $Self Care/Home Management : 8-22 mins  Bradd Canary, OTR/L Acute Rehab Services Office: [phone]    Lorre Munroe 05/14/2021, 2:41 PM

## 2021-05-14 NOTE — Progress Notes (Signed)
Heart Failure Stewardship Pharmacist Progress Note   PCP: Silverio Decamp, MD PCP-Cardiologist: None    HPI:  80 yo F with PMH of HTN, HLD, HF, afib, and CVA. She presented to the ED on 8/12 with worsening dyspnea on exertion, PND/orthopnea, and LE edema. CXR on admission significant for cardiomegaly and moderate pulmonary congestion without frank edema. An ECHO was done on 8/13 and LVEF was 45-50% (was 50-55% in 05/2014; 25-30% in 07/2012).   Current HF Medications: Furosemide 60 mg BID Metoprolol XL 100 mg daily  Prior to admission HF Medications: Furosemide 40 mg daily Metoprolol XL 100 mg daily  Pertinent Lab Values: Serum creatinine 1.76, BUN 28, Potassium 4.4, Sodium 140, BNP 845  Vital Signs: Weight: 177 lbs (admission weight: 184 lbs) Blood pressure: 110/70s  Heart rate: 60s   Medication Assistance / Insurance Benefits Check: Does the patient have prescription insurance?  Yes Type of insurance plan: BCBS Medicare  Does the patient qualify for medication assistance through manufacturers or grants?   Pending Eligible grants and/or patient assistance programs: pending Medication assistance applications in progress: none  Medication assistance applications approved: none Approved medication assistance renewals will be completed by: pending  Outpatient Pharmacy:  Prior to admission outpatient pharmacy: [facility] Is the patient willing to use [facility] TOC pharmacy at discharge? Yes Is the patient willing to transition their outpatient pharmacy to utilize a [facility] outpatient pharmacy?   Pending    Assessment: 1. 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 NYHA class II symptoms. - Agree with starting furosemide 60 mg BID - Continue metoprolol XL 100 mg daily - Entresto 24/26 mg BID stopped today - noted no reduction in systolic function. However, with historically low LVEF, guidelines recommend to continue GDMT in patients with improved LVEF. Would restart  Entresto 24/26 mg BID - messaged MD. - Consider adding Jardiance 10 mg daily prior to discharge   Plan: 1) Medication changes recommended at this time: - Restart Entresto 24/26 mg BID  2) Patient assistance: - HF TOC appt scheduled for 8/24 - Entresto copay $37 per month - Jardiance copay $37 per month - Farxiga not on patient's insurance formulary   3)  Education  - To be completed prior to discharge  Sharen Hones, PharmD, BCPS Heart Failure Engineer, building services Phone [phone]

## 2021-05-14 NOTE — Progress Notes (Signed)
ANTICOAGULATION CONSULT NOTE - Follow Up Consult  Pharmacy Consult for Coumadin Indication: atrial fibrillation  Allergies  Allergen Reactions   Adhesive [Tape] Itching, Rash and Other (See Comments)    Redness and skin breaks down   Latex Rash    Patient Measurements: Height: 5' (152.4 cm) Weight: 80.5 kg (177 lb 6.4 oz) IBW/kg (Calculated) : 45.5  Vital Signs: Temp: 97.6 F (36.4 C) (08/16 0435) Temp Source: Oral (08/16 0435) BP: 138/83 (08/16 0435) Pulse Rate: 63 (08/16 0435)  Labs: Recent Labs    05/12/21 0402 05/13/21 0340 05/14/21 0348  HGB 13.0  --   --   HCT 40.9  --   --   PLT 138*  --   --   LABPROT 24.0* 22.4* 22.3*  INR 2.2* 2.0* 2.0*  CREATININE 1.29* 1.55* 1.76*    Estimated Creatinine Clearance: 24.3 mL/min (A) (by C-G formula based on SCr of 1.76 mg/dL (H)).    Assessment: Anticoag: PTA warfarin for AF. INR goal 1.8-2.2 (h/o ICH)  - PTA regimen 1.5 mg Tu/Sa/Su and 3 mg M/W/Th/F. INR 1.6 on admit. - INR 2. Hgb WNL on 8/14. Plts 138 low normal.  Goal of Therapy:  INR 1.8-2.2 Monitor platelets by anticoagulation protocol: Yes   Plan:  - Resume home Coumadin regimen: 1.5 mg Tu/Sa/Su and 3 mg M/W/Th/F - Daily INR, CBC   [name], PharmD, BCPS Clinical Staff Pharmacist [email or web address]  [name], [employer] 05/14/2021,7:56 AM

## 2021-05-14 NOTE — Progress Notes (Signed)
   05/14/21 1022  Mobility  Activity Refused mobility (Patient was asleep; asked to return after lunch)

## 2021-05-14 NOTE — Care Management Important Message (Signed)
Important Message  Patient Details  Name: [name] MRN: [id number] Date of Birth: 26-Jan-1941   Medicare Important Message Given:  Yes     Renie Ora 05/14/2021, 11:49 AM

## 2021-05-14 NOTE — Discharge Instructions (Signed)
Information on my medicine - Coumadin   (Warfarin)  Why was Coumadin prescribed for you? Coumadin was prescribed for you because you have a blood clot or a medical condition that can cause an increased risk of forming blood clots. Blood clots can cause serious health problems by blocking the flow of blood to the heart, lung, or brain. Coumadin can prevent harmful blood clots from forming. As a reminder your indication for Coumadin is:   afib  What test will check on my response to Coumadin? While on Coumadin (warfarin) you will need to have an INR test regularly to ensure that your dose is keeping you in the desired range. The INR (international normalized ratio) number is calculated from the result of the laboratory test called prothrombin time (PT).  If an INR APPOINTMENT HAS NOT ALREADY BEEN MADE FOR YOU please schedule an appointment to have this lab work done by your health care provider within 7 days. Your INR goal is usually a number between:  2 to 3 or your provider may give you a more narrow range like 2-2.5.  Ask your health care provider during an office visit what your goal INR is.  What  do you need to  know  About  COUMADIN? Take Coumadin (warfarin) exactly as prescribed by your healthcare provider about the same time each day.  DO NOT stop taking without talking to the doctor who prescribed the medication.  Stopping without other blood clot prevention medication to take the place of Coumadin may increase your risk of developing a new clot or stroke.  Get refills before you run out.  What do you do if you miss a dose? If you miss a dose, take it as soon as you remember on the same day then continue your regularly scheduled regimen the next day.  Do not take two doses of Coumadin at the same time.  Important Safety Information A possible side effect of Coumadin (Warfarin) is an increased risk of bleeding. You should call your healthcare provider right away if you experience any of  the following: Bleeding from an injury or your nose that does not stop. Unusual colored urine (red or dark brown) or unusual colored stools (red or black). Unusual bruising for unknown reasons. A serious fall or if you hit your head (even if there is no bleeding).  Some foods or medicines interact with Coumadin (warfarin) and might alter your response to warfarin. To help avoid this: Eat a balanced diet, maintaining a consistent amount of Vitamin K. Notify your provider about major diet changes you plan to make. Avoid alcohol or limit your intake to 1 drink for women and 2 drinks for men per day. (1 drink is 5 oz. wine, 12 oz. beer, or 1.5 oz. liquor.)  Make sure that ANY health care provider who prescribes medication for you knows that you are taking Coumadin (warfarin).  Also make sure the healthcare provider who is monitoring your Coumadin knows when you have started a new medication including herbals and non-prescription products.  Coumadin (Warfarin)  Major Drug Interactions  Increased Warfarin Effect Decreased Warfarin Effect  Alcohol (large quantities) Antibiotics (esp. Septra/Bactrim, Flagyl, Cipro) Amiodarone (Cordarone) Aspirin (ASA) Cimetidine (Tagamet) Megestrol (Megace) NSAIDs (ibuprofen, naproxen, etc.) Piroxicam (Feldene) Propafenone (Rythmol SR) Propranolol (Inderal) Isoniazid (INH) Posaconazole (Noxafil) Barbiturates (Phenobarbital) Carbamazepine (Tegretol) Chlordiazepoxide (Librium) Cholestyramine (Questran) Griseofulvin Oral Contraceptives Rifampin Sucralfate (Carafate) Vitamin K   Coumadin (Warfarin) Major Herbal Interactions  Increased Warfarin Effect Decreased Warfarin Effect  Garlic Ginseng Ginkgo   biloba Coenzyme Q10 Green tea St. John's wort    Coumadin (Warfarin) FOOD Interactions  Eat a consistent number of servings per week of foods HIGH in Vitamin K (1 serving =  cup)  Collards (cooked, or boiled & drained) Kale (cooked, or boiled &  drained) Mustard greens (cooked, or boiled & drained) Parsley *serving size only =  cup Spinach (cooked, or boiled & drained) Swiss chard (cooked, or boiled & drained) Turnip greens (cooked, or boiled & drained)  Eat a consistent number of servings per week of foods MEDIUM-HIGH in Vitamin K (1 serving = 1 cup)  Asparagus (cooked, or boiled & drained) Broccoli (cooked, boiled & drained, or raw & chopped) Brussel sprouts (cooked, or boiled & drained) *serving size only =  cup Lettuce, raw (green leaf, endive, romaine) Spinach, raw Turnip greens, raw & chopped   These websites have more information on Coumadin (warfarin):  www.coumadin.com; www.ahrq.gov/consumer/coumadin.htm;   

## 2021-05-15 ENCOUNTER — Inpatient Hospital Stay (HOSPITAL_COMMUNITY): Payer: Medicare Other

## 2021-05-15 ENCOUNTER — Other Ambulatory Visit (HOSPITAL_COMMUNITY): Payer: Medicare Other

## 2021-05-15 DIAGNOSIS — I130 Hypertensive heart and chronic kidney disease with heart failure and stage 1 through stage 4 chronic kidney disease, or unspecified chronic kidney disease: Secondary | ICD-10-CM | POA: Diagnosis not present

## 2021-05-15 DIAGNOSIS — I5033 Acute on chronic diastolic (congestive) heart failure: Secondary | ICD-10-CM | POA: Diagnosis not present

## 2021-05-15 DIAGNOSIS — R0602 Shortness of breath: Secondary | ICD-10-CM | POA: Diagnosis not present

## 2021-05-15 LAB — BASIC METABOLIC PANEL
Anion gap: 9 (ref 5–15)
BUN: 42 mg/dL — ABNORMAL HIGH (ref 8–23)
CO2: 33 mmol/L — ABNORMAL HIGH (ref 22–32)
Calcium: 9.4 mg/dL (ref 8.9–10.3)
Chloride: 96 mmol/L — ABNORMAL LOW (ref 98–111)
Creatinine, Ser: 1.94 mg/dL — ABNORMAL HIGH (ref 0.44–1.00)
GFR, Estimated: 26 mL/min — ABNORMAL LOW (ref >60)
Glucose, Bld: 96 mg/dL (ref 70–99)
Potassium: 4.2 mmol/L (ref 3.5–5.1)
Sodium: 138 mmol/L (ref 135–145)

## 2021-05-15 LAB — PROTIME-INR
INR: 2 — ABNORMAL HIGH (ref 0.8–1.2)
Prothrombin Time: 22.3 seconds — ABNORMAL HIGH (ref 11.4–15.2)

## 2021-05-15 MED ORDER — SENNOSIDES-DOCUSATE SODIUM 8.6-50 MG PO TABS
1.0000 | ORAL_TABLET | Freq: Two times a day (BID) | ORAL | Status: DC
  Administered 2021-05-15 – 2021-05-16 (×3): 1 via ORAL
  Filled 2021-05-15 (×3): qty 1

## 2021-05-15 MED ORDER — FUROSEMIDE 40 MG PO TABS
60.0000 mg | ORAL_TABLET | Freq: Two times a day (BID) | ORAL | Status: DC
  Administered 2021-05-16: 60 mg via ORAL
  Filled 2021-05-15: qty 1

## 2021-05-15 NOTE — Progress Notes (Signed)
PROGRESS NOTE    [name]  BOF:[id number] DOB: 1941-06-12 DOA: 05/10/2021 PCP: Silverio Decamp, MD    Chief Complaint  Patient presents with   Shortness of Breath    Brief Narrative:  [name] is a 80 y.o. 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, history of DVT with IVC filter placement 2000 ,afib/ stroke who is on coumadin , presents with sob , found to be in heart failure, possibly due to poor dietary compliance at home, follows with cardiology at [facility], visit in 06/22 increase furosemide to 80 mg daily, for volume overload Family reports she was on home o2 in the past, has not required for several years, but she required oxygen for the last 2 days prior to admission due to sob, family also reports she has a sleep study was in the process of getting bipap arranged  Subjective:  Feel weak, denies chest pain, reports lower extremity edema has improved Cr went up Constipated  Assessment & Plan:   Principal Problem:   Acute on chronic diastolic CHF (congestive heart failure) (HCC) Active Problems:   Essential hypertension   CKD (chronic kidney disease), stage III (HCC)   Chronic atrial fibrillation (HCC)   Chronic anticoagulation- Coumadin   Prolonged QT interval   Subtherapeutic anticoagulation   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 Follow up echocardiogram with EF 45 to 50% on the LV, with global hypokinesis, severe reduction in RV systolic function, severe enlarged RV cavity, RSVP 39,54 mmHg. Severe dilatation biatrial. TR mild to moderate. Aortic valve prosthesis with adequate function.    -Received IV Lasix in the hospital, lower extremity edema has resolved, however creatinine got worse -Continue hold Lasix, repeat BMP in the morning -Daughter plan to take patient to [facility] in the next few days, daughter plan for patient to follow-up with cardiology in [facility]  Chronic A. Fib Rate controlled with metoprolol, on warfarin, INR today is 2  AKI on CKD 3B -Creatinine worsening likely due to diuresis, she was also on entresto from 8/13 to 8/16 -entresto discontinued, lasix held -check renal US -repeat bmp in am   HLD continue statin  Gout Stable on allopurinol  FTT: home PT  Obesity : Body mass index is 34.86 kg/m.[name] Per family patient had sleep study done recently in the process of getting BiPAP     Unresulted Labs (From admission, onward)     Start     Ordered   05/16/21 0500  Basic metabolic panel  Tomorrow morning,   R       Question:  Specimen collection method  Answer:  Lab=Lab collect   05/15/21 1238   05/11/21 0500  Protime-INR  Daily,   R      05/11/21 0405              DVT prophylaxis:  warfarin (COUMADIN) tablet 1.5 mg  warfarin (COUMADIN) tablet 3 mg   Code Status:full Family Communication: talked to daughter  [name]  Disposition:   Status is: Inpatient  Dispo: The patient is from: Home              Anticipated d/c is to: Home with family, need  home O2, home PT              Anticipated d/c date is: 1-2 days pending cr improvement                Consultants:  None  Procedures:  None  Antimicrobials:   Anti-infectives (From admission, onward)    None           Objective: Vitals:   05/14/21 1137 05/14/21 2015 05/15/21 0432 05/15/21 1113  BP: 111/76 94/77 104/72 122/74  Pulse: 60 71 (!) 59 (!) 58  Resp: 16 17 17 18   Temp: 98 F (36.7 C) (!) 97.4 F (36.3 C) 97.6 F (36.4 C) 98.3 F (36.8 C)  TempSrc:  Oral Oral Oral  SpO2: 100% 99% 98% 100%  Weight:   81 kg   Height:        Intake/Output Summary (Last 24  hours) at 05/15/2021 1238 Last data filed at 05/15/2021 0925 [name] per 24 hour  Intake 960 ml  Output 850 ml  Net 110 ml   Filed Weights   05/13/21 0402 05/14/21 0435 05/15/21 0432  Weight: 81.3 kg 80.5 kg 81 kg    Examination:  General exam: frail, weak, calm, NAD, hard of hearing, with hearing aids Respiratory system: Clear to auscultation. Respiratory effort normal. Cardiovascular system: S1 & S2 heard, IRRR. Gastrointestinal system: Abdomen is nondistended, soft and nontender. Normal bowel sounds heard. Central nervous system: Alert and oriented. No focal neurological deficits. Extremities: no edema Skin: No rashes, lesions or ulcers Psychiatry: Judgement and insight appear normal. Mood & affect appropriate.     Data Reviewed: I have personally reviewed following labs and imaging studies  CBC: Recent Labs  Lab 05/10/21 1949 05/10/21 2023 05/11/21 0313 05/12/21 0402  WBC 6.6  --  8.1 5.6  NEUTROABS 3.6  --   --   --   HGB 14.4 15.0 12.6 13.0  HCT 44.4 44.0 39.6 40.9  MCV 100.7*  --  102.1* 101.0*  PLT 156  --  147* 138*    Basic Metabolic Panel: Recent Labs  Lab 05/11/21 0313 05/12/21 0402 05/13/21 0340 05/14/21 0348 05/15/21 0211  NA 141 144 142 140 138  K 3.4* 3.2* 3.9 4.4 4.2  CL 101 97* 95* 96* 96*  CO2 31 36* 35* 34* 33*  GLUCOSE 97 85 87 86 96  BUN 22 20 24* 28* 42*  CREATININE 1.32* 1.29* 1.55* 1.76* 1.94*  CALCIUM 8.9 9.2 9.5 9.8 9.4    GFR: Estimated Creatinine Clearance: 22.2 mL/min (A) (by C-G formula based on SCr of 1.94 mg/dL (H)).  Liver Function Tests: Recent Labs  Lab 05/10/21 1949  AST 29  ALT 20  ALKPHOS 58  BILITOT 0.6  PROT 8.0  ALBUMIN 4.2    CBG: No results for input(s): GLUCAP in the last 168 hours.   Recent Results (from the past 240 hour(s))  Resp Panel by RT-PCR (Flu A&B, Covid) Nasopharyngeal Swab     Status: None   Collection Time: 05/10/21  7:49 PM   Specimen: Nasopharyngeal Swab; Nasopharyngeal(NP) swabs  in vial transport medium  Result Value Ref Range Status   SARS Coronavirus 2 by RT PCR NEGATIVE NEGATIVE Final    Comment: (NOTE) SARS-CoV-2 target nucleic acids are NOT DETECTED.  The SARS-CoV-2 RNA is generally detectable in upper respiratory specimens during the acute phase of infection. The lowest concentration of SARS-CoV-2 viral copies this assay can detect is 138 copies/mL. A negative result does not preclude SARS-Cov-2  infection and should not be used as the sole basis for treatment or other patient management decisions. A negative result may occur with  improper specimen collection/handling, submission of specimen other than nasopharyngeal swab, presence of viral mutation(s) within the areas targeted by this assay, and inadequate number of viral copies(<138 copies/mL). A negative result must be combined with clinical observations, patient history, and epidemiological information. The expected result is Negative.  Fact Sheet for Patients:  BloggerCourse.com  Fact Sheet for Healthcare Providers:  SeriousBroker.it  This test is no t yet approved or cleared by the [place] FDA and  has been authorized for detection and/or diagnosis of SARS-CoV-2 by FDA under an Emergency Use Authorization (EUA). This EUA will remain  in effect (meaning this test can be used) for the duration of the COVID-19 declaration under Section 564(b)(1) of the Act, 21 U.S.C.section 360bbb-3(b)(1), unless the authorization is terminated  or revoked sooner.       Influenza A by PCR NEGATIVE NEGATIVE Final   Influenza B by PCR NEGATIVE NEGATIVE Final    Comment: (NOTE) The Xpert Xpress SARS-CoV-2/FLU/RSV plus assay is intended as an aid in the diagnosis of influenza from Nasopharyngeal swab specimens and should not be used as a sole basis for treatment. Nasal washings and aspirates are unacceptable for Xpert Xpress  SARS-CoV-2/FLU/RSV testing.  Fact Sheet for Patients: BloggerCourse.com  Fact Sheet for Healthcare Providers: SeriousBroker.it  This test is not yet approved or cleared by the [place] FDA and has been authorized for detection and/or diagnosis of SARS-CoV-2 by FDA under an Emergency Use Authorization (EUA). This EUA will remain in effect (meaning this test can be used) for the duration of the COVID-19 declaration under Section 564(b)(1) of the Act, 21 U.S.C. section 360bbb-3(b)(1), unless the authorization is terminated or revoked.  Performed at [facility], [address], [place], [place] [address]          Radiology Studies: No results found.      Scheduled Meds:  allopurinol  50 mg Oral Daily   aspirin EC  81 mg Oral Daily   calcium carbonate  625 mg Oral Q supper   cholecalciferol   Oral Daily   [START ON 05/16/2021] furosemide  60 mg Oral BID   metoprolol succinate  100 mg Oral QHS   rosuvastatin  20 mg Oral QHS   senna-docusate  1 tablet Oral BID   sodium chloride flush  3 mL Intravenous Q12H   warfarin  1.5 mg Oral Once per day on Sun Tue Sat   warfarin  3 mg Oral Once per day on Mon Wed Thu Fri   Warfarin - Pharmacist Dosing Inpatient   Does not apply q1600   Continuous Infusions:  sodium chloride       LOS: 4 days   Time spent: Greater than 50% of this time was spent in counseling, explanation of diagnosis, planning of further management, and coordination of care.   Voice Recognition Reubin Milan dictation system was used to create this note, attempts have been made to correct errors. Please contact the author with questions and/or clarifications.   Albertine Grates, MD PhD FACP [facility]  Available via Epic secure chat 7am-7pm for nonurgent issues Please page for urgent issues To page the attending provider between 7A-7P or the covering provider during after hours 7P-7A,  please log into the web site [email or web address] and access using universal [place] password for that web site. If you do not have the  password, please call the hospital operator.    05/15/2021, 12:38 PM

## 2021-05-15 NOTE — TOC Progression Note (Addendum)
Transition of Care [facility]) - Progression Note    Patient Details  Name: [name] MRN: [id number] Date of Birth: Mar 14, 1941  Transition of Care [facility]) CM/SW Contact  Leone Haven, RN Phone Number: 05/15/2021, 4:42 PM  Clinical Narrative:    NCM spoke with patient, offered choice, she states she has great family support from her children and she does not want HH services, they take really good care of her.  She will be going home to one of her children's home when she is discharged.    8/18- NCM spoke with daughter , [name], she states patient does need the [facility] services.  She spoke with the patient about it last pm and she agreed to go ahead with the [facility] services.        Expected Discharge Plan and Services                                                 Social Determinants of Health (SDOH) Interventions    Readmission Risk Interventions No flowsheet data found.

## 2021-05-15 NOTE — Progress Notes (Signed)
ANTICOAGULATION CONSULT NOTE - Follow Up Consult  Pharmacy Consult for Coumadin Indication: atrial fibrillation  Allergies  Allergen Reactions   Adhesive [Tape] Itching, Rash and Other (See Comments)    Redness and skin breaks down   Latex Rash    Patient Measurements: Height: 5' (152.4 cm) Weight: 81 kg (178 lb 8 oz) IBW/kg (Calculated) : 45.5  Vital Signs: Temp: 97.6 F (36.4 C) (08/17 0432) Temp Source: Oral (08/17 0432) BP: 104/72 (08/17 0432) Pulse Rate: 59 (08/17 0432)  Labs: Recent Labs    05/13/21 0340 05/14/21 0348 05/15/21 0211  LABPROT 22.4* 22.3* 22.3*  INR 2.0* 2.0* 2.0*  CREATININE 1.55* 1.76* 1.94*     Estimated Creatinine Clearance: 22.2 mL/min (A) (by C-G formula based on SCr of 1.94 mg/dL (H)).    Assessment: Anticoag: PTA warfarin for AF. INR goal 1.8-2.2 (h/o ICH)  - PTA regimen 1.5 mg Tu/Sa/Su and 3 mg M/W/Th/F. INR 1.6 on admit. - INR remains 2 today. Hgb WNL on 8/14. Plts 138 low normal.  Goal of Therapy:  INR 1.8-2.2 Monitor platelets by anticoagulation protocol: Yes   Plan:  - Resume home Coumadin regimen: 1.5 mg Tu/Sa/Su and 3 mg M/W/Th/F - Daily INR, CBC   Jahrel Borthwick S. Merilynn Finland, PharmD, BCPS Clinical Staff Pharmacist [email or web address]  Merilynn Finland, [employer] 05/15/2021,7:51 AM

## 2021-05-15 NOTE — Progress Notes (Signed)
Heart Failure Stewardship Pharmacist Progress Note   PCP: Silverio Decamp, MD PCP-Cardiologist: None    HPI:  80 yo F with PMH of HTN, HLD, HF, afib, and CVA. She presented to the ED on 8/12 with worsening dyspnea on exertion, PND/orthopnea, and LE edema. CXR on admission significant for cardiomegaly and moderate pulmonary congestion without frank edema. An ECHO was done on 8/13 and LVEF was 45-50% (was 50-55% in 05/2014; 25-30% in 07/2012).   Current HF Medications: Furosemide 60 mg BID (starting 8/18) Metoprolol XL 100 mg daily  Prior to admission HF Medications: Furosemide 40 mg daily Metoprolol XL 100 mg daily  Pertinent Lab Values: Serum creatinine 1.94, BUN 42, Potassium 4.2, Sodium 138, BNP 845  Vital Signs: Weight: 178 lbs (admission weight: 184 lbs) Blood pressure: 100/70s  Heart rate: 60s   Medication Assistance / Insurance Benefits Check: Does the patient have prescription insurance?  Yes Type of insurance plan: BCBS Medicare  Does the patient qualify for medication assistance through manufacturers or grants?   Pending Eligible grants and/or patient assistance programs: pending Medication assistance applications in progress: none  Medication assistance applications approved: none Approved medication assistance renewals will be completed by: pending  Outpatient Pharmacy:  Prior to admission outpatient pharmacy: [facility] Is the patient willing to use [facility] TOC pharmacy at discharge? Yes Is the patient willing to transition their outpatient pharmacy to utilize a [facility] outpatient pharmacy?   Pending    Assessment: 1. 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 NYHA class II symptoms. - Holding lasix today, resume furosemide 60 mg BID tomorrow - Continue metoprolol XL 100 mg daily - Entresto 24/26 mg BID stopped today - noted no reduction in systolic function. However, with historically low LVEF, guidelines recommend to continue GDMT in patients  with improved LVEF. Would restart Entresto 24/26 mg BID - messaged Dr. Ella Jubilee and will hold off initiating until SCr improves - Consider adding Jardiance 10 mg daily prior to discharge   Plan: 1) Medication changes recommended at this time: - Continue current regimen  2) Patient assistance: - HF TOC appt scheduled for 8/24 - Entresto copay $37 per month - Jardiance copay $37 per month - Farxiga not on patient's insurance formulary   3)  Education  - To be completed prior to discharge  Sharen Hones, PharmD, BCPS Heart Failure Engineer, building services Phone [phone]

## 2021-05-15 NOTE — Social Work (Signed)
CSW spoke with pt daughter [name] who shared that pt lives with her sister in [place]. [name] also explained that pt has lots of support throughout her 4 kids and living space at each residence. Pt will go home with [facility] to address listed on chart.

## 2021-05-16 ENCOUNTER — Other Ambulatory Visit (HOSPITAL_COMMUNITY): Payer: Self-pay

## 2021-05-16 DIAGNOSIS — R0602 Shortness of breath: Secondary | ICD-10-CM | POA: Diagnosis not present

## 2021-05-16 DIAGNOSIS — I130 Hypertensive heart and chronic kidney disease with heart failure and stage 1 through stage 4 chronic kidney disease, or unspecified chronic kidney disease: Secondary | ICD-10-CM | POA: Diagnosis not present

## 2021-05-16 DIAGNOSIS — I5033 Acute on chronic diastolic (congestive) heart failure: Secondary | ICD-10-CM | POA: Diagnosis not present

## 2021-05-16 LAB — PROTIME-INR
INR: 1.9 — ABNORMAL HIGH (ref 0.8–1.2)
Prothrombin Time: 21.3 seconds — ABNORMAL HIGH (ref 11.4–15.2)

## 2021-05-16 LAB — BASIC METABOLIC PANEL
Anion gap: 10 (ref 5–15)
BUN: 44 mg/dL — ABNORMAL HIGH (ref 8–23)
CO2: 32 mmol/L (ref 22–32)
Calcium: 9.6 mg/dL (ref 8.9–10.3)
Chloride: 97 mmol/L — ABNORMAL LOW (ref 98–111)
Creatinine, Ser: 1.9 mg/dL — ABNORMAL HIGH (ref 0.44–1.00)
GFR, Estimated: 27 mL/min — ABNORMAL LOW (ref >60)
Glucose, Bld: 108 mg/dL — ABNORMAL HIGH (ref 70–99)
Potassium: 4.2 mmol/L (ref 3.5–5.1)
Sodium: 139 mmol/L (ref 135–145)

## 2021-05-16 MED ORDER — ROSUVASTATIN CALCIUM 5 MG PO TABS: 10.0000 mg | ORAL_TABLET | Freq: Every day | ORAL | Status: DC

## 2021-05-16 MED ORDER — METOPROLOL SUCCINATE ER 100 MG PO TB24
100.0000 mg | ORAL_TABLET | Freq: Every day | ORAL | 0 refills | Status: AC
  Filled 2021-05-16: qty 30, 30d supply, fill #0

## 2021-05-16 MED ORDER — SENNOSIDES-DOCUSATE SODIUM 8.6-50 MG PO TABS
1.0000 | ORAL_TABLET | Freq: Every day | ORAL | 0 refills | Status: AC
  Filled 2021-05-16: qty 30, 30d supply, fill #0

## 2021-05-16 MED ORDER — EMPAGLIFLOZIN 10 MG PO TABS: 10.0000 mg | ORAL_TABLET | Freq: Every day | ORAL | Status: AC

## 2021-05-16 MED ORDER — FUROSEMIDE 20 MG PO TABS
60.0000 mg | ORAL_TABLET | Freq: Two times a day (BID) | ORAL | 0 refills | Status: AC
  Filled 2021-05-16: qty 180, 30d supply, fill #0

## 2021-05-16 MED ORDER — ENTRESTO 24-26 MG PO TABS: 1.0000 | ORAL_TABLET | Freq: Two times a day (BID) | ORAL | Status: AC

## 2021-05-16 MED ORDER — VITAMIN D 25 MCG (1000 UNIT) PO TABS
2000.0000 [IU] | ORAL_TABLET | Freq: Every day | ORAL | Status: DC
  Administered 2021-05-16: 2000 [IU] via ORAL
  Filled 2021-05-16: qty 2

## 2021-05-16 MED ORDER — ROSUVASTATIN CALCIUM 20 MG PO TABS: 20.0000 mg | ORAL_TABLET | Freq: Every day | ORAL | Status: AC

## 2021-05-16 NOTE — TOC Transition Note (Signed)
Transition of Care [facility]) - CM/SW Discharge Note   Patient Details  Name: [name] MRN: [id number] Date of Birth: 06-01-1941  Transition [facility] Care [facility] CM/SW Contact:  Leone Haven, RN Phone Number: 05/16/2021, 9:47 AM   Clinical Narrative:    Patient is for dc today, she has home oxygen at home already, which she will only need at night per MD.  NCM confirmed with daughter, [name], that she has home oxygen, she states patient has a concentrator at the house.  NCM spoke with patient again this am about the [facility] services , she does not have a preference of the agency, NCM made referral to [facility] with [facility] for HHRN, HHPT, HHOT.  She is able to take referral. Soc will begin 24 to 48 hrs post dc.  [name] will be transporting patient home. To the address listed on this acct which is patient other daughter;s address, [name])  [phone].    Final next level of care: Home w Home Health Services Barriers to Discharge: No Barriers Identified   Patient Goals and CMS Choice Patient states their goals for this hospitalization and ongoing recovery are:: return home with [facility] CMS Medicare.gov Compare Post Acute Care list provided to:: Patient Choice offered to / list presented to : Patient  Discharge Placement                       Discharge Plan and Services                  DME Agency: NA       HH Arranged: RN, PT, Disease Management, OT HH Agency: [facility] Date [facility] Agency Contacted: 05/16/21 Time HH Agency Contacted: [phone] Representative spoke with at [facility] Agency: Stacie  Social Determinants of Health (SDOH) Interventions     Readmission Risk Interventions No flowsheet data found.

## 2021-05-16 NOTE — Progress Notes (Signed)
Heart Failure Stewardship Pharmacist Progress Note   PCP: Silverio Decamp, MD PCP-Cardiologist: None    HPI:  80 yo F with PMH of HTN, HLD, HF, afib, and CVA. She presented to the ED on 8/12 with worsening dyspnea on exertion, PND/orthopnea, and LE edema. CXR on admission significant for cardiomegaly and moderate pulmonary congestion without frank edema. An ECHO was done on 8/13 and LVEF was 45-50% (was 50-55% in 05/2014; 25-30% in 07/2012). Renal ultrasound on 8/17 showed mild R hydronephrosis.  Current HF Medications: Furosemide 60 mg PO BID Metoprolol XL 100 mg daily  Prior to admission HF Medications: Furosemide 40 mg daily Metoprolol XL 100 mg daily  Pertinent Lab Values: Serum creatinine 1.90, BUN 44, Potassium 4.2, Sodium 139, BNP 845  Vital Signs: Weight: 179 lbs (admission weight: 184 lbs) Blood pressure: 100/70s  Heart rate: 60s   Medication Assistance / Insurance Benefits Check: Does the patient have prescription insurance?  Yes Type of insurance plan: BCBS Medicare  Does the patient qualify for medication assistance through manufacturers or grants?   Pending Eligible grants and/or patient assistance programs: pending Medication assistance applications in progress: none  Medication assistance applications approved: none Approved medication assistance renewals will be completed by: pending  Outpatient Pharmacy:  Prior to [facility]: [facility] Is the patient willing to use [facility] TOC pharmacy at discharge? Yes Is the patient willing to transition their outpatient pharmacy to utilize a [facility] outpatient pharmacy?   Pending    Assessment: 1. 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 NYHA class II symptoms. - Continue furosemide 60 mg PO BID  - Continue metoprolol XL 100 mg daily - Entresto 24/26 mg BID held - noted no reduction in systolic function. However, with historically low LVEF, guidelines recommend to continue GDMT in  patients with improved LVEF. Would restart Entresto 24/26 mg BID - messaged Dr. Ella Jubilee and will hold off initiating until SCr improves - Consider adding Jardiance 10 mg daily prior to discharge   Plan: 1) Medication changes recommended at this time: - Continue current regimen  2) Patient assistance: - HF TOC appt scheduled for 8/24 - noted that patient may move to [facility] post-discharge - HF RN navigator will assess to see if she will still be interested in coming to this appt vs cancel per patient/family request - Entresto copay $37 per month - Jardiance copay $37 per month - Farxiga not on patient's insurance formulary   3)  Education  - To be completed prior to discharge  Sharen Hones, PharmD, BCPS Heart Failure Engineer, building services Phone [phone]

## 2021-05-16 NOTE — Discharge Summary (Signed)
Discharge Summary  Heather AmmonsJeritta R Zarling ZOX:[id number] DOB: 11/17/1940  PCP: Silverio DecampAlexander, Steven, MD  Admit date: 05/10/2021 Discharge date: 05/16/2021  Time spent: 45mins, more than 50% time spent on coordination of care.  Recommendations for Outpatient Follow-up:  F/u with PCP within a week  for hospital discharge follow up, repeat cbc/bmp at follow up F/u with cardiology Dr Dayna RamusJeffery Daw  [facility]( [place])in one week F/u with nephrology for chronic kidney disease , also noticed mild hydronephrosis on the right Home health F/u with coumadin clinic for INR monitoring    Discharge Diagnoses:  Active Hospital Problems   Diagnosis Date Noted   Acute on chronic diastolic CHF (congestive heart failure) (HCC) 05/10/2021   Prolonged QT interval 05/11/2021   Subtherapeutic anticoagulation 05/11/2021   Chronic anticoagulation- Coumadin 06/21/2014   Chronic atrial fibrillation (HCC) 11/04/2013   CKD (chronic kidney disease), stage III (HCC) 11/04/2013   Essential hypertension 01/02/2009    Resolved Hospital Problems  No resolved problems to display.    Discharge Condition: stable  Diet recommendation: heart healthy  Filed Weights   05/14/21 0435 05/15/21 0432 05/16/21 0258  Weight: 80.5 kg 81 kg 81.5 kg    History of present illness:   [name] is a 80 y.o. 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, history of DVT with IVC filter placement 2000 ,afib/ stroke who is on coumadin , presents with sob , found to be in heart failure  Hospital Course:  Principal Problem:   Acute on chronic diastolic CHF (congestive heart failure) (HCC) Active Problems:   Essential hypertension   CKD (chronic kidney disease), stage III (HCC)   Chronic atrial fibrillation (HCC)   Chronic anticoagulation- Coumadin   Prolonged QT  interval   Subtherapeutic anticoagulation   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 Follow up echocardiogram with EF 45 to 50% on the LV, with global hypokinesis, severe reduction in RV systolic function, severe enlarged RV cavity, RSVP 39,54 mmHg. Severe dilatation biatrial. TR mild to moderate. Aortic valve prosthesis with adequate function.   -Received IV Lasix 60mg  tid initially in the hospital, lower extremity edema has resolved, however creatinine got worse, lasix held on 8/16 and 8/17, resumed on 8/18 at 60mg  bid, she is to continue to take metoprolol xl 100mg  daily, family decide on hold of initiating entresto and jardiance until follow up with cardiology Dr Dayna RamusJeffery Daw next week   Chronic A. Fib Rate controlled with metoprolol, on warfarin, appreciate pharmacy assistance with coumadin dosing Patient is to follow up with coumadin clinic for INR monitoring    AKI on CKD 3B -Creatinine worsening likely due to diuresis, she was also on entresto from 8/13 to 8/16 -entresto discontinued, lasix held for 48hrs,  -cr peaked at 1.94, improving, discharge on  lasix at 60mg  bid -renal us, Mild right hydronephrosis.bladder scan unremarkable, Good urine output ,cr improved, -repeat bmp next week -F/u with pcp/cardiology/nephrology    HLD continue statin, renal dosing, crestor   Gout Stable on allopurinol   FTT: home PT arranged    Obesity : Body mass index is 34.86 kg/m.. OSA, on home o2 at night, continue  Per family patient had sleep study  done recently in the process of getting BiPAP       Procedures: none  Consultations: Heart failure pharmacist Case manager  Discharge Exam: BP (!) 106/93 (BP Location: Left Arm)   Pulse 66   Temp 97.6 F (36.4 C) (Oral)   Resp 18   Ht 5' (1.524 m)    Wt 81.5 kg   SpO2 97%   BMI 35.10 kg/m   General: frail elderly, NAD Cardiovascular: IRRR Respiratory: improved aeration, no rales, no wheezing, no rhonchi, normal respiratory effort, on room air  Discharge Instructions You were cared for by a hospitalist during your hospital stay. If you have any questions about your discharge medications or the care you received while you were in the hospital after you are discharged, you can call the unit and asked to speak with the hospitalist on call if the hospitalist that took care of you is not available. Once you are discharged, your primary care physician will handle any further medical issues. Please note that NO REFILLS for any discharge medications will be authorized once you are discharged, as it is imperative that you return to your primary care physician (or establish a relationship with a primary care physician if you do not have one) for your aftercare needs so that they can reassess your need for medications and monitor your lab values.  Discharge Instructions     Diet - low sodium heart healthy   Complete by: As directed    Discharge instructions   Complete by: As directed    Please weight yourself daily and keep a weight record, please bring in weight record for your heart doctor to review at hospital discharge follow up   Increase activity slowly   Complete by: As directed       Allergies as of 05/16/2021       Reactions   Adhesive [tape] Itching, Rash, Other (See Comments)   Redness and skin breaks down   Latex Rash        Medication List     STOP taking these medications    amLODipine 10 MG tablet Commonly known as: NORVASC   atorvastatin 20 MG tablet Commonly known as: LIPITOR   bisoprolol 10 MG tablet Commonly known as: ZEBETA       TAKE these medications    allopurinol 100 MG tablet Commonly known as: ZYLOPRIM TAKE 1 TABLET (100 MG TOTAL) BY MOUTH 2 (TWO) TIMES DAILY. What changed:  how much to  take how to take this when to take this additional instructions   aspirin EC 81 MG tablet Take 1 tablet (81 mg total) by mouth daily.   calcium carbonate 600 MG Tabs tablet Commonly known as: OS-CAL Take 600 mg by mouth daily.   Cholecalciferol 50 MCG (2000 UT) Caps Take 2 capsules by mouth daily.   empagliflozin 10 MG Tabs tablet Commonly known as: Jardiance Take 1 tablet (10 mg total) by mouth daily before breakfast. Please discuss with your cardiology regarding this medication for heart failure management   Entresto 24-26 MG Generic drug: sacubitril-valsartan Take 1 tablet by mouth 2 (two) times daily. Please discuss with your cardiology regarding timing to start this medication for heart failure treatment   furosemide 20 MG tablet Commonly known as: LASIX Take 3 tablets (60 mg total) by mouth 2 (two) times daily. What changed:  medication strength how much to take when to take this   HAIR/SKIN/NAILS PO Take 1 tablet by mouth daily.   metoprolol succinate 100 MG  24 hr tablet Commonly known as: TOPROL-XL Take 1 tablet (100 mg total) by mouth at bedtime. Take with or immediately following a meal. What changed: additional instructions   rosuvastatin 20 MG tablet Commonly known as: CRESTOR Take 1 tablet (20 mg total) by mouth at bedtime. Please take half a tab daily for now, please have your cardiologist check you renal function, can go back to one full tab daily once renal function improves back to your baseline. What changed: additional instructions   senna-docusate 8.6-50 MG tablet Commonly known as: Senokot-S Take 1 tablet by mouth at bedtime. Hold if diarrhea   warfarin 3 MG tablet Commonly known as: COUMADIN Take 3mg  daily except 1.5mg  on Tuesdays or as directed by Coumadin Clinic What changed:  how much to take how to take this when to take this additional instructions       Allergies  Allergen Reactions   Adhesive [Tape] Itching, Rash and Other  (See Comments)    Redness and skin breaks down   Latex Rash    Follow-up Information     [facility] Follow up.   Specialty: Home Health Services Why: [facility] Contact information: [address] [place] [address] [phone]         Silverio Decamp, MD. Go on 05/24/2021.   Specialty: Family Medicine Why: @4 :20pm Contact information: [address] [place] [address] [phone]         Duard Larsen, MD Follow up in 1 week(s).   Specialty: Cardiology Why: hospital discharge follow up for heart failure management, cardiology to refer patient to nephrology to monitor chronic kidney disease Contact information: [address] [place] [address] [phone]         f/u with coumadin clinic for INR monitoring on Friday and Monday Follow up.                   The results of significant diagnostics from this hospitalization (including imaging, microbiology, ancillary and laboratory) are listed below for reference.    Significant Diagnostic Studies: US RENAL  Result Date: 05/15/2021 CLINICAL DATA:  Increased creatinine. EXAM: RENAL / URINARY TRACT ULTRASOUND COMPLETE COMPARISON:  CT abdomen pelvis 07/25/2016 FINDINGS: Right Kidney: Renal measurements: 9.2 x 3.6 x 4 cm = volume: 68 mL. Echogenicity within normal limits. There is a 4.4 x 2.7 x 5.1 cm simple right renal cyst. No associated mural nodularity, septation, vascularity. No solid mass. Mild hydronephrosis visualized. Left Kidney: Renal measurements: 10.7 x 3.74.5 cm = volume: 93 mL. Echogenicity within normal limits. There is a 2 x 1.4 x 1.7 cm simple left renal cyst. No associated mural nodularity, septation, vascularity. No solid mass or hydronephrosis visualized. Urinary bladder: Appears normal for degree of bladder distention. Other: None. IMPRESSION: Mild right hydronephrosis. Electronically Signed   By: Tish Frederickson M.D.   On:  05/15/2021 19:53   DG Chest Port 1 View  Result Date: 05/10/2021 CLINICAL DATA:  Shortness of breath for 1 week, worse today. EXAM: PORTABLE CHEST 1 VIEW COMPARISON:  Two-view chest x-ray 07/25/2016 FINDINGS: Heart is enlarged. Atherosclerotic changes are noted at the aortic arch. Patient is status post aortic valve replacement and atrial clipping. AICD is in place. Lung volumes are low. Moderate pulmonary vascular congestion is present. No focal airspace disease is evident. IMPRESSION: Cardiomegaly and moderate pulmonary vascular congestion without frank edema. Electronically Signed   By: Marin Roberts M.D.   On: 05/10/2021 20:11  ECHOCARDIOGRAM COMPLETE  Result Date: 05/11/2021    ECHOCARDIOGRAM REPORT   Patient Name:   TEAGEN BUCIO Date of Exam: 05/11/2021 Medical Rec #:  [id number]        Height:       60.0 in Accession #:    [id number]       Weight:       184.7 lb Date of Birth:  1941-06-26         BSA:          1.805 m Patient Age:    80 years         BP:           141/78 mmHg Patient Gender: F                HR:           48 bpm. Exam Location:  Inpatient Procedure: 2D Echo Indications:     congestive heart failure  History:         Patient has prior history of Echocardiogram examinations, most                  recent 06/20/2014. Defibrillator and aortic root replacement,                  chronic kidney disease; Risk Factors:Hypertension and                  Dyslipidemia.                  Aortic Valve: 21 mm bioprosthetic valve is present in the                  aortic position. Procedure Date: 04/12/2012.  Sonographer:     Delcie Roch RDCS Referring Phys:  [id number] Carlton Adam Diagnosing Phys: Lennie Odor MD IMPRESSIONS  1. Left ventricular ejection fraction, by estimation, is 45 to 50%. The left ventricle has mildly decreased function. The left ventricle demonstrates global hypokinesis. Left ventricular diastolic function could not be evaluated.  2. Right ventricular systolic  function is severely reduced. The right ventricular size is severely enlarged. There is mildly elevated pulmonary artery systolic pressure. The estimated right ventricular systolic pressure is 39.4 mmHg.  3. Left atrial size was severely dilated.  4. Right atrial size was severely dilated.  5. The mitral valve is grossly normal. Trivial mitral valve regurgitation. No evidence of mitral stenosis.  6. Tricuspid valve regurgitation is mild to moderate.  7. 21 mm Magna Ease in aortic position. Vmax 1.9 m/s, MG 8.2 mmHG, EOA 1.53, DI 0.60. Normal prosthesis. The aortic valve has been repaired/replaced. Aortic valve regurgitation is not visualized. There is a 21 mm bioprosthetic valve present in the aortic position. Procedure Date: 04/12/2012. Echo findings are consistent with normal structure and function of the aortic valve prosthesis.  8. S/p ascending aortic repair (04/12/2012). Aortic root/ascending aorta has been repaired/replaced. There is severe dilatation of the aortic root, measuring 44 mm.  9. The inferior vena cava is dilated in size with <50% respiratory variability, suggesting right atrial pressure of 15 mmHg. Comparison(s): A prior study was performed on 06/20/2014. Changes from prior study are noted. EF appears mildly reduced now 45-50%. Severe RV dilation with reduced function. Normal AoV prosthesis. Aortic root is severely dilated but was 43 mm on prior echo in 2015, and is unchanged at 44 mm on today's study. FINDINGS  Left Ventricle: Left ventricular ejection fraction, by estimation, is 45 to  50%. The left ventricle has mildly decreased function. The left ventricle demonstrates global hypokinesis. The left ventricular internal cavity size was normal in size. There is  no left ventricular hypertrophy. Left ventricular diastolic function could not be evaluated due to atrial fibrillation. Left ventricular diastolic function could not be evaluated. Right Ventricle: The right ventricular size is severely  enlarged. No increase in right ventricular wall thickness. Right ventricular systolic function is severely reduced. There is mildly elevated pulmonary artery systolic pressure. The tricuspid regurgitant velocity is 2.47 m/s, and with an assumed right atrial pressure of 15 mmHg, the estimated right ventricular systolic pressure is 39.4 mmHg. Left Atrium: Left atrial size was severely dilated. Right Atrium: Right atrial size was severely dilated. Pericardium: There is no evidence of pericardial effusion. Presence of pericardial fat pad. Mitral Valve: The mitral valve is grossly normal. Trivial mitral valve regurgitation. No evidence of mitral valve stenosis. Tricuspid Valve: The tricuspid valve is grossly normal. Tricuspid valve regurgitation is mild to moderate. No evidence of tricuspid stenosis. Aortic Valve: 21 mm Magna Ease in aortic position. Vmax 1.9 m/s, MG 8.2 mmHG, EOA 1.53, DI 0.60. Normal prosthesis. The aortic valve has been repaired/replaced. Aortic valve regurgitation is not visualized. Aortic valve mean gradient measures 8.2 mmHg. Aortic valve peak gradient measures 14.8 mmHg. Aortic valve area, by VTI measures 1.53 cm. There is a 21 mm bioprosthetic valve present in the aortic position. Procedure Date: 04/12/2012. Echo findings are consistent with normal structure and function of  the aortic valve prosthesis. Pulmonic Valve: The pulmonic valve was grossly normal. Pulmonic valve regurgitation is trivial. No evidence of pulmonic stenosis. Aorta: S/p ascending aortic repair (04/12/2012). The aortic root/ascending aorta has been repaired/replaced. There is severe dilatation of the aortic root, measuring 44 mm. Venous: The inferior vena cava is dilated in size with less than 50% respiratory variability, suggesting right atrial pressure of 15 mmHg. IAS/Shunts: The atrial septum is grossly normal. Additional Comments: A device lead is visualized in the right atrium and right ventricle.  LEFT VENTRICLE PLAX 2D  LVIDd:         4.25 cm LVIDs:         3.80 cm LV PW:         1.30 cm LV IVS:        1.10 cm LVOT diam:     1.80 cm LV SV:         63 LV SV Index:   35 LVOT Area:     2.54 cm  RIGHT VENTRICLE            IVC RV S prime:     9.25 cm/s  IVC diam: 2.60 cm LEFT ATRIUM              Index       RIGHT ATRIUM           Index LA diam:        3.50 cm  1.94 cm/m  RA Area:     22.10 cm LA Vol (A2C):   82.4 ml  45.66 ml/m RA Volume:   66.40 ml  36.79 ml/m LA Vol (A4C):   109.0 ml 60.40 ml/m LA Biplane Vol: 96.7 ml  53.58 ml/m  AORTIC VALVE AV Area (Vmax):    1.46 cm AV Area (Vmean):   1.40 cm AV Area (VTI):     1.53 cm AV Vmax:           192.50 cm/s AV Vmean:  135.500 cm/s AV VTI:            0.414 m AV Peak Grad:      14.8 mmHg AV Mean Grad:      8.2 mmHg LVOT Vmax:         110.67 cm/s LVOT Vmean:        74.633 cm/s LVOT VTI:          0.248 m LVOT/AV VTI ratio: 0.60  AORTA Ao Root diam: 4.30 cm Ao Asc diam:  3.40 cm TRICUSPID VALVE TR Peak grad:   24.4 mmHg TR Vmax:        247.00 cm/s  SHUNTS Systemic VTI:  0.25 m Systemic Diam: 1.80 cm Lennie Odor MD Electronically signed by Lennie Odor MD Signature Date/Time: 05/11/2021/4:31:45 PM    Final (Updated)     Microbiology: Recent Results (from the past 240 hour(s))  Resp Panel by RT-PCR (Flu A&B, Covid) Nasopharyngeal Swab     Status: None   Collection Time: 05/10/21  7:49 PM   Specimen: Nasopharyngeal Swab; Nasopharyngeal(NP) swabs in vial transport medium  Result Value Ref Range Status   SARS Coronavirus 2 by RT PCR NEGATIVE NEGATIVE Final    Comment: (NOTE) SARS-CoV-2 target nucleic acids are NOT DETECTED.  The SARS-CoV-2 RNA is generally detectable in upper respiratory specimens during the acute phase of infection. The lowest concentration of SARS-CoV-2 viral copies this assay can detect is 138 copies/mL. A negative result does not preclude SARS-Cov-2 infection and should not be used as the sole basis for treatment or other patient management  decisions. A negative result may occur with  improper specimen collection/handling, submission of specimen other than nasopharyngeal swab, presence of viral mutation(s) within the areas targeted by this assay, and inadequate number of viral copies(<138 copies/mL). A negative result must be combined with clinical observations, patient history, and epidemiological information. The expected result is Negative.  Fact Sheet for Patients:  BloggerCourse.com  Fact Sheet for Healthcare Providers:  SeriousBroker.it  This test is no t yet approved or cleared by the [place] FDA and  has been authorized for detection and/or diagnosis of SARS-CoV-2 by FDA under an Emergency Use Authorization (EUA). This EUA will remain  in effect (meaning this test can be used) for the duration of the COVID-19 declaration under Section 564(b)(1) of the Act, 21 U.S.C.section 360bbb-3(b)(1), unless the authorization is terminated  or revoked sooner.       Influenza A by PCR NEGATIVE NEGATIVE Final   Influenza B by PCR NEGATIVE NEGATIVE Final    Comment: (NOTE) The Xpert Xpress SARS-CoV-2/FLU/RSV plus assay is intended as an aid in the diagnosis of influenza from Nasopharyngeal swab specimens and should not be used as a sole basis for treatment. Nasal washings and aspirates are unacceptable for Xpert Xpress SARS-CoV-2/FLU/RSV testing.  Fact Sheet for Patients: BloggerCourse.com  Fact Sheet for Healthcare Providers: SeriousBroker.it  This test is not yet approved or cleared by the [place] FDA and has been authorized for detection and/or diagnosis of SARS-CoV-2 by FDA under an Emergency Use Authorization (EUA). This EUA will remain in effect (meaning this test can be used) for the duration of the COVID-19 declaration under Section 564(b)(1) of the Act, 21 U.S.C. section 360bbb-3(b)(1), unless the  authorization is terminated or revoked.  Performed at [facility], [address] [place], [place] [address]      Labs: Basic Metabolic Panel: Recent Labs  Lab 05/12/21 0402 05/13/21 0340 05/14/21 0348 05/15/21 0211 05/16/21  0137  NA 144 142 140 138 139  K 3.2* 3.9 4.4 4.2 4.2  CL 97* 95* 96* 96* 97*  CO2 36* 35* 34* 33* 32  GLUCOSE 85 87 86 96 108*  BUN 20 24* 28* 42* 44*  CREATININE 1.29* 1.55* 1.76* 1.94* 1.90*  CALCIUM 9.2 9.5 9.8 9.4 9.6   Liver Function Tests: Recent Labs  Lab 05/10/21 1949  AST 29  ALT 20  ALKPHOS 58  BILITOT 0.6  PROT 8.0  ALBUMIN 4.2   No results for input(s): LIPASE, AMYLASE in the last 168 hours. No results for input(s): AMMONIA in the last 168 hours. CBC: Recent Labs  Lab 05/10/21 1949 05/10/21 2023 05/11/21 0313 05/12/21 0402  WBC 6.6  --  8.1 5.6  NEUTROABS 3.6  --   --   --   HGB 14.4 15.0 12.6 13.0  HCT 44.4 44.0 39.6 40.9  MCV 100.7*  --  102.1* 101.0*  PLT 156  --  147* 138*   Cardiac Enzymes: No results for input(s): CKTOTAL, CKMB, CKMBINDEX, TROPONINI in the last 168 hours. BNP: BNP (last 3 results) Recent Labs    05/10/21 1949  BNP 845.0*    ProBNP (last 3 results) No results for input(s): PROBNP in the last 8760 hours.  CBG: No results for input(s): GLUCAP in the last 168 hours.     Signed:  Albertine Grates MD, PhD, FACP  [facility] 05/16/2021, 10:53 AM

## 2021-05-16 NOTE — Progress Notes (Signed)
  Mobility Specialist Criteria Algorithm Info.  Mobility Team: [facility] elevated:Self regulated Activity: Ambulated in hall (In chair before and after ambulation) Range of motion: Active; All extremities Level of assistance: Standby assist, set-up cues, supervision of patient - no hands on Assistive device: Front wheel walker Minutes sitting in chair:  Minutes stood: 5 minutes Minutes ambulated: 5 minutes Distance ambulated (ft): 200 ft Mobility response: Tolerated well Bed Position: Chair  Patient received in chair upon entry, agreed to participate in mobility. Ambulated in hallway 200 feet with steady gait using RW. Required cues x1 to stay proximal to RW. Returned to chair upon completion, tolerated ambulation well without complaint or incident. Was left in chair with all needs met..  05/16/2021 3:46 PM

## 2021-05-16 NOTE — Progress Notes (Signed)
Physical Therapy Treatment Patient Details Name: [name] MRN: [id number] DOB: 1941-07-26 Today's Date: 05/16/2021    History of Present Illness Pt is a 80 y.o. female admitted 05/10/21 with SOB. CXR with bilateral hilar vascular congestion. Workup for decompensated HF, AKI. PMH includes HTN, CHF, afib, CVA, gout, obesity.   PT Comments    Pt progressing well with mobility. Today's session focused on gait progression for improved activity tolerance. Pt preparing for d/c home today; will have necessary DME and support from family. If to remain admitted, will continue to follow acutely.    Follow Up Recommendations  Home health PT;Supervision - Intermittent     Equipment Recommendations  None recommended by PT    Recommendations for Other Services       Precautions / Restrictions Precautions Precautions: Fall Restrictions Weight Bearing Restrictions: No    Mobility  Bed Mobility               General bed mobility comments: up in chair    Transfers Overall transfer level: Modified independent Equipment used: Rolling walker (2 wheeled) Transfers: Sit to/from Stand           General transfer comment: Pt requesting use of RW; mod indep  Ambulation/Gait Ambulation/Gait assistance: Supervision Gait Distance (Feet): 120 Feet Assistive device: Rolling walker (2 wheeled) Gait Pattern/deviations: Step-through pattern;Decreased stride length Gait velocity: Decreased Gait velocity interpretation: 1.31 - 2.62 ft/sec, indicative of limited community ambulator General Gait Details: Slow, steady gait with DME; pt initially with no DOE noted, conversing easily with ambulation; by end of walk, pt with DOE 3/4 requiring seated rest break, SpO2 91% on RA   Stairs             Wheelchair Mobility    Modified Rankin (Stroke Patients Only)       Balance Overall balance assessment: Needs assistance Sitting-balance support: Feet supported Sitting balance-Leahy  Scale: Good     Standing balance support: No upper extremity supported;During functional activity Standing balance-Leahy Scale: Fair Standing balance comment: Can static stand and take steps without UE support; static and dynamic stability improved with UE support                            Cognition Arousal/Alertness: Awake/alert Behavior During Therapy: WFL for tasks assessed/performed Overall Cognitive Status: Within Functional Limits for tasks assessed                                        Exercises      General Comments General comments (skin integrity, edema, etc.): SpO2 91-98% on RA, HR 74; reviewed d/c recommendations and needs; pt reports she will likely d/c to youngest daughter's home, will have necessary support      Pertinent Vitals/Pain Pain Assessment: No/denies pain    Home Living                      Prior Function            PT Goals (current goals can now be found in the care plan section) Progress towards PT goals: Progressing toward goals    Frequency    Min 3X/week      PT Plan Current plan remains appropriate    Co-evaluation              AM-PAC PT "6 Clicks"  Mobility   Outcome Measure  Help needed turning from your back to your side while in a flat bed without using bedrails?: None Help needed moving from lying on your back to sitting on the side of a flat bed without using bedrails?: None Help needed moving to and from a bed to a chair (including a wheelchair)?: None Help needed standing up from a chair using your arms (e.g., wheelchair or bedside chair)?: None Help needed to walk in hospital room?: A Little Help needed climbing 3-5 steps with a railing? : A Little 6 Click Score: 22    End of Session   Activity Tolerance: Patient tolerated treatment well Patient left: in chair;with call bell/phone within reach;Other (comment) (with MD present) Nurse Communication: Mobility status PT Visit  Diagnosis: Unsteadiness on feet (R26.81);Other symptoms and signs involving the nervous system (F84.037)     Time: 5436-0677 PT Time Calculation (min) (ACUTE ONLY): 14 min  Charges:  $Therapeutic Exercise: 8-22 mins                     Ina Homes, PT, DPT Acute Rehabilitation Services  Pager [phone] Office [phone]  Malachy Chamber 05/16/2021, 10:40 AM

## 2021-05-16 NOTE — Progress Notes (Signed)
ANTICOAGULATION CONSULT NOTE - Follow Up Consult  Pharmacy Consult for Coumadin Indication: atrial fibrillation  Allergies  Allergen Reactions   Adhesive [Tape] Itching, Rash and Other (See Comments)    Redness and skin breaks down   Latex Rash    Patient Measurements: Height: 5' (152.4 cm) Weight: 81.5 kg (179 lb 11.2 oz) IBW/kg (Calculated) : 45.5  Vital Signs: Temp: 97.9 F (36.6 C) (08/18 0258) Temp Source: Oral (08/18 0258) BP: 137/72 (08/18 0258) Pulse Rate: 57 (08/18 0258)  Labs: Recent Labs    05/14/21 0348 05/15/21 0211 05/16/21 0137  LABPROT 22.3* 22.3* 21.3*  INR 2.0* 2.0* 1.9*  CREATININE 1.76* 1.94* 1.90*     Estimated Creatinine Clearance: 22.7 mL/min (A) (by C-G formula based on SCr of 1.9 mg/dL (H)).    Assessment: Anticoag: PTA warfarin for AF. INR goal 1.8-2.2 (h/o ICH)  - PTA regimen 1.5 mg Tu/Sa/Su and 3 mg M/W/Th/F. INR 1.6 on admit. - INR 1.9 today remains in goal. Hgb WNL on 8/14. Plts 138 low normal.  Goal of Therapy:  INR 1.8-2.2 Monitor platelets by anticoagulation protocol: Yes   Plan:  - Con't home Coumadin regimen: 1.5 mg Tu/Sa/Su and 3 mg M/W/Th/F - Daily INR, CBC - Decrease Crestor to 10mg /d for CrCl<30    Khiley Lieser S. , PharmD, BCPS Clinical Staff Pharmacist [email or web address]  Merilynn Finland, Freman Lapage Stillinger 05/16/2021,7:40 AM

## 2021-05-16 NOTE — Progress Notes (Addendum)
Heart Failure Nurse Navigator Progress Note  Spoke with patient regarding HF education and planned appointment with HV TOC clinic next week. Planned DC today. Pt staying with daughter in [place] area, has established cardiology with Dr. Benjaman Kindler in [place], [place]. Plans to continue and follow up with Dr. Benjaman Kindler post hospitalization.   Made patient aware of cancellation of HV TOC appointment. Spoke with [name], daughter, to clarify plan for follow up with established cardiology and cancellation of TOC appt.   Education Assessment and Provision:  Detailed education and instructions provided on heart failure disease management including the following:  Signs and symptoms of Heart Failure When to call the physician Importance of daily weights Low sodium diet Fluid restriction Medication management Anticipated future follow-up appointments  Patient education given on each of the above topics.  Patient acknowledges understanding via teach back method and acceptance of all instructions.  Education Materials:  "Living Better With Heart Failure" Booklet, HF zone tool, & Daily Weight Tracker Tool.  Patient has scale at home: yes Patient has pill box at home: yes  Navigator available as needed.  Ozella Rocks, MSN, RN Heart Failure Nurse Navigator [phone]

## 2021-05-16 NOTE — Progress Notes (Signed)
SATURATION QUALIFICATIONS: (This note is used to comply with regulatory documentation for home oxygen)  Patient Saturations on Room Air at Rest = 98%  Patient Saturations on Room Air while Ambulating = 91%  Please briefly explain why patient needs home oxygen: N/A  Ina Homes, PT, DPT Acute Rehabilitation Services  Pager [phone] Office [phone]

## 2021-05-22 ENCOUNTER — Encounter (HOSPITAL_COMMUNITY): Payer: Medicare Other

## 2022-05-31 IMAGING — US US RENAL
1 series · 14 of 25 positions shown · non-contrast
Comparison: CT abdomen pelvis 07/25/2016

CLINICAL DATA: Increased creatinine.

EXAM:
RENAL / URINARY TRACT ULTRASOUND COMPLETE

[Series 1: us renal · 49 acquisitions, 14 frames shown]
[im 1/49]
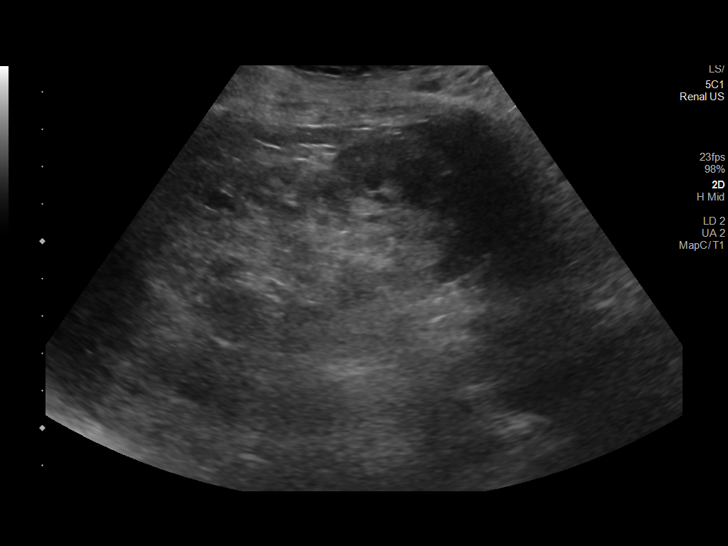
[im 5/49]
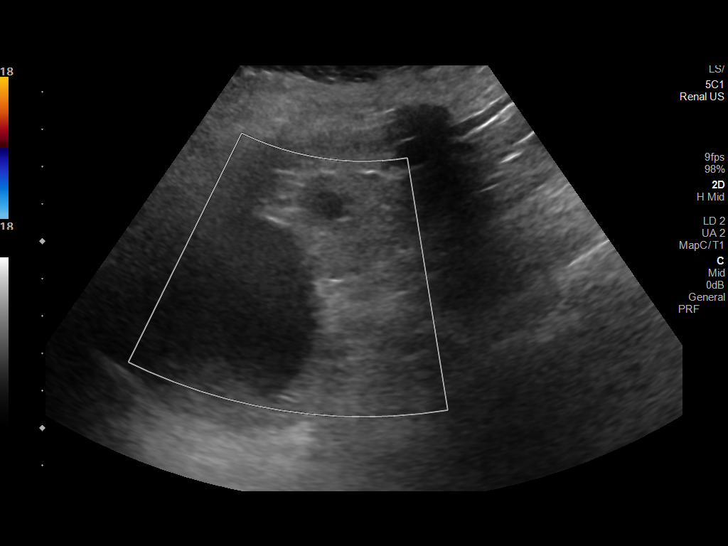
[im 9/49]
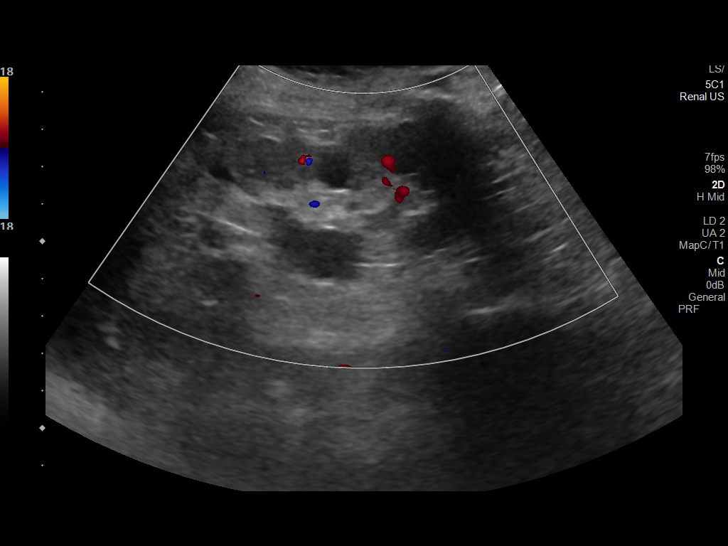
[im 13/49]
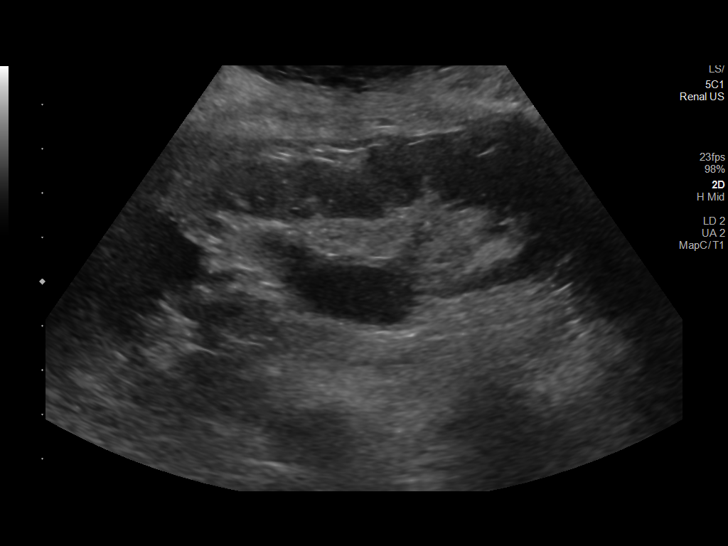
[im 17/49]
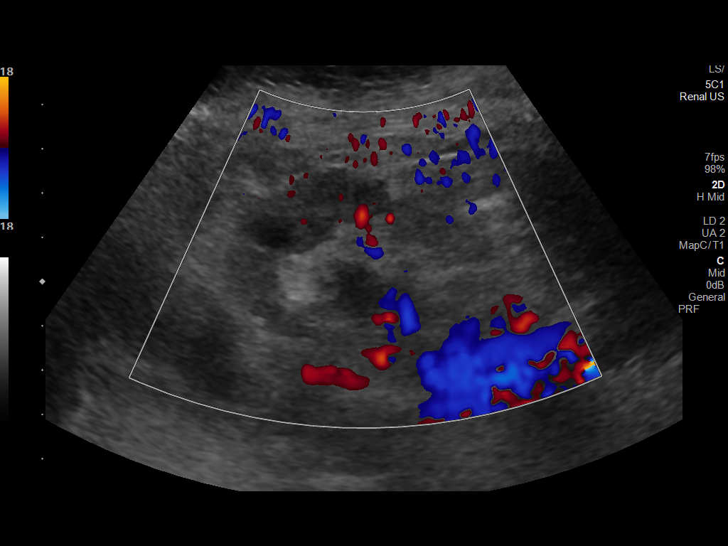
[im 19/49]
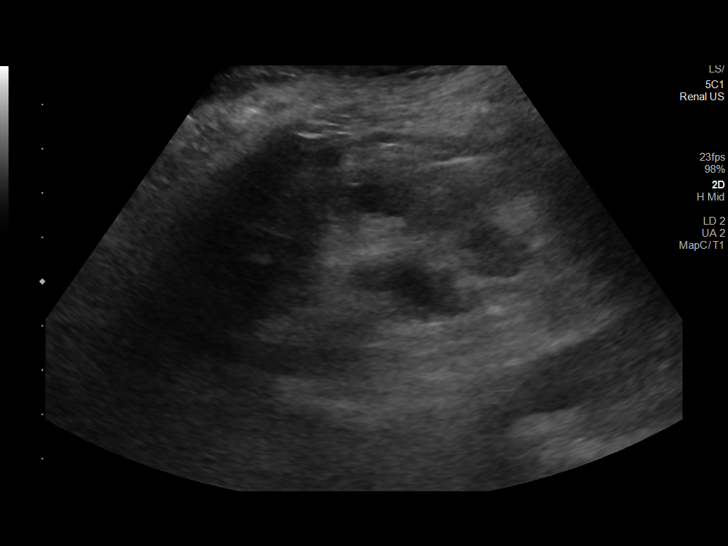
[im 23/49]
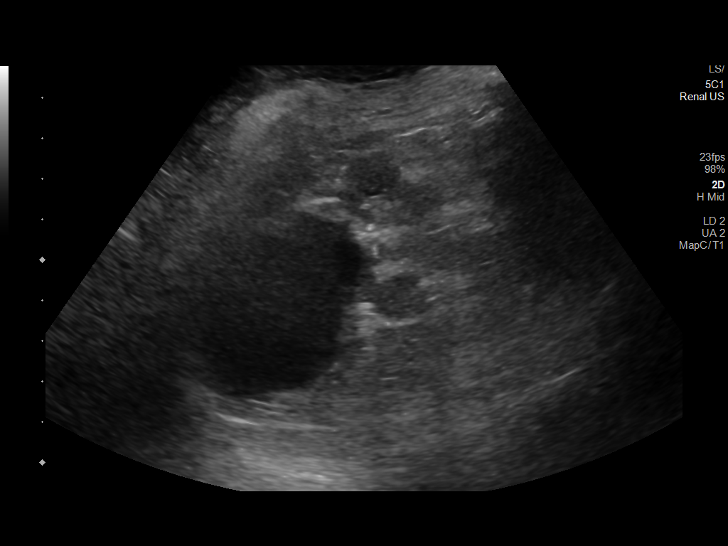
[im 27/49]
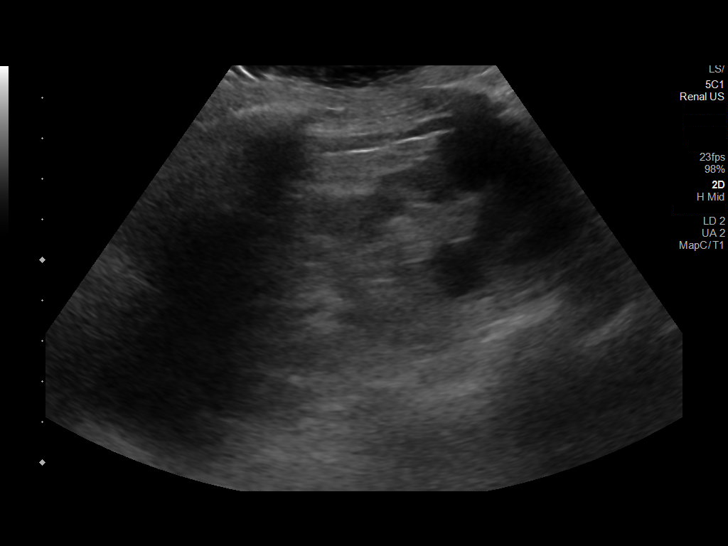
[im 31/49]
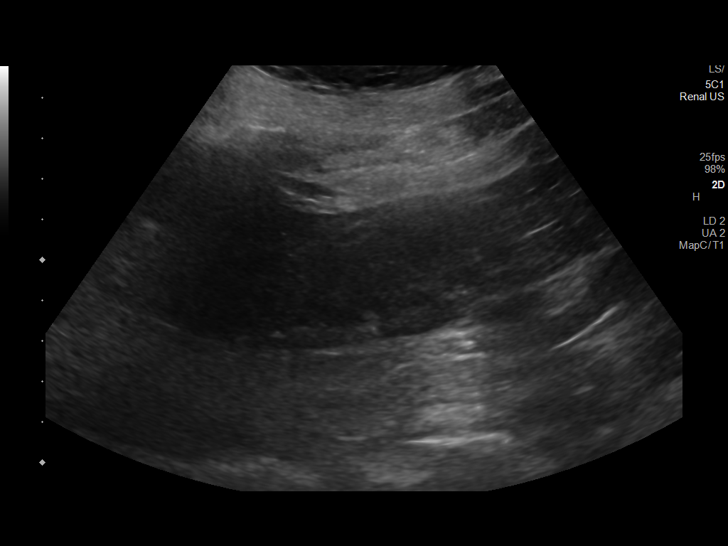
[im 33/49]
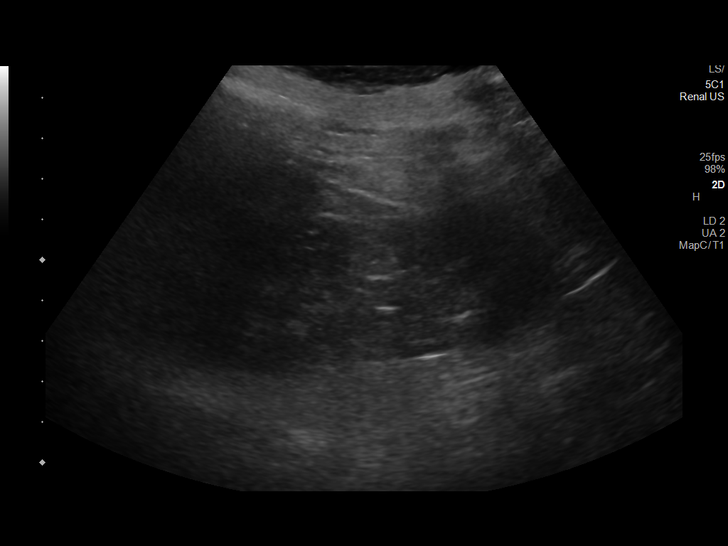
[im 37/49]
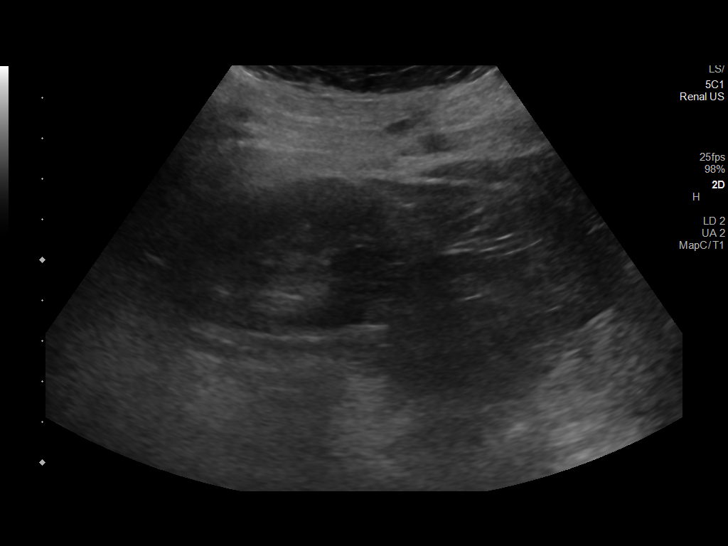
[im 41/49]
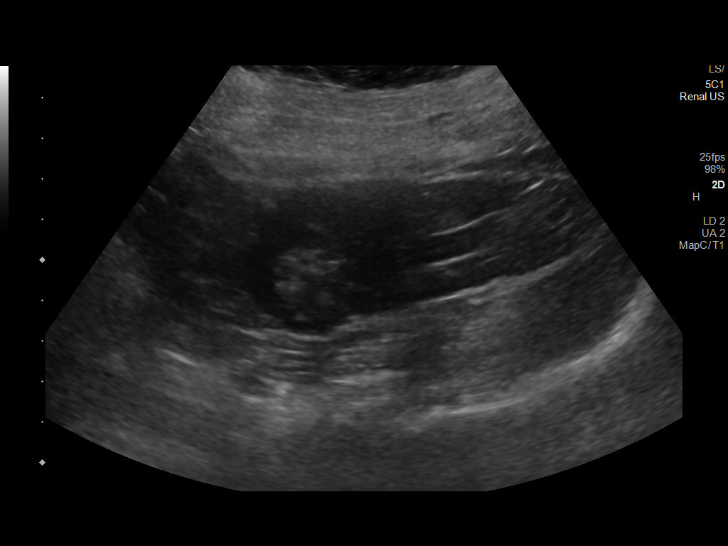
[im 45/49]
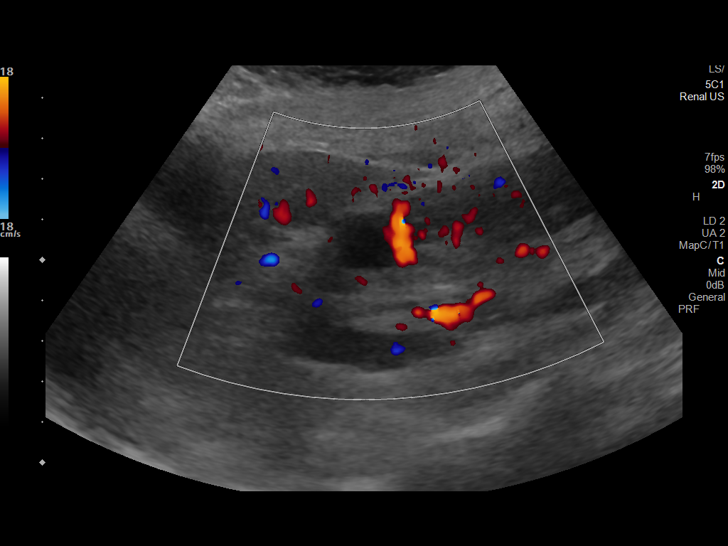
[im 49/49]
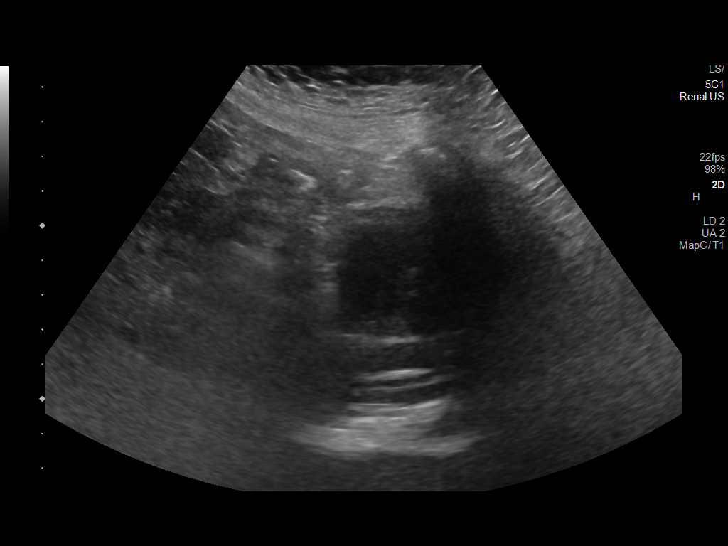

[14 of 25 positions shown; findings below may reference images not displayed]

FINDINGS: Right Kidney:

Renal measurements: 9.2 x 3.6 x 4 cm = volume: 68 mL. Echogenicity
within normal limits. There is a 4.4 x 2.7 x 5.1 cm simple right
renal cyst. No associated mural nodularity, septation, vascularity.
No solid mass. Mild hydronephrosis visualized.

Left Kidney:

Renal measurements: 10.7 x 3.74.5 cm = volume: 93 mL. Echogenicity
within normal limits. There is a 2 x 1.4 x 1.7 cm simple left renal
cyst. No associated mural nodularity, septation, vascularity. No
solid mass or hydronephrosis visualized.

Urinary bladder:

Appears normal for degree of bladder distention.

Other:

None.
IMPRESSION: Mild right hydronephrosis.
